# Patient Record
Sex: Male | Born: 1940
Health system: Southern US, Community
[De-identification: ages and names within clinical notes are randomized; demographics above are authoritative.]

## PROBLEM LIST (undated history)

## (undated) DIAGNOSIS — Z95810 Presence of automatic (implantable) cardiac defibrillator: Secondary | ICD-10-CM

## (undated) DIAGNOSIS — I428 Other cardiomyopathies: Secondary | ICD-10-CM

## (undated) DIAGNOSIS — I472 Ventricular tachycardia: Secondary | ICD-10-CM

## (undated) DIAGNOSIS — E785 Hyperlipidemia, unspecified: Secondary | ICD-10-CM

## (undated) DIAGNOSIS — I493 Ventricular premature depolarization: Secondary | ICD-10-CM

## (undated) DIAGNOSIS — I4719 Other supraventricular tachycardia: Secondary | ICD-10-CM

## (undated) DIAGNOSIS — Z950 Presence of cardiac pacemaker: Secondary | ICD-10-CM

## (undated) DIAGNOSIS — I4729 Other ventricular tachycardia: Secondary | ICD-10-CM

## (undated) DIAGNOSIS — J449 Chronic obstructive pulmonary disease, unspecified: Secondary | ICD-10-CM

## (undated) DIAGNOSIS — R911 Solitary pulmonary nodule: Secondary | ICD-10-CM

## (undated) DIAGNOSIS — I471 Supraventricular tachycardia: Secondary | ICD-10-CM

## (undated) DIAGNOSIS — M1990 Unspecified osteoarthritis, unspecified site: Secondary | ICD-10-CM

## (undated) DIAGNOSIS — I4891 Unspecified atrial fibrillation: Secondary | ICD-10-CM

## (undated) DIAGNOSIS — I252 Old myocardial infarction: Secondary | ICD-10-CM

## (undated) DIAGNOSIS — I5022 Chronic systolic (congestive) heart failure: Secondary | ICD-10-CM

## (undated) DIAGNOSIS — I10 Essential (primary) hypertension: Secondary | ICD-10-CM

## (undated) HISTORY — PX: PACEMAKER IMPLANT: EP1218

## (undated) HISTORY — DX: Solitary pulmonary nodule: R91.1

## (undated) HISTORY — PX: COLON SURGERY: SHX602

## (undated) HISTORY — DX: Hyperlipidemia, unspecified: E78.5

## (undated) HISTORY — DX: Other supraventricular tachycardia: I47.19

## (undated) HISTORY — DX: Old myocardial infarction: I25.2

## (undated) HISTORY — DX: Supraventricular tachycardia: I47.1

## (undated) HISTORY — DX: Chronic systolic (congestive) heart failure: I50.22

## (undated) HISTORY — DX: Chronic obstructive pulmonary disease, unspecified: J44.9

## (undated) HISTORY — DX: Essential (primary) hypertension: I10

## (undated) HISTORY — PX: APPENDECTOMY: SHX54

## (undated) HISTORY — PX: CARDIAC DEFIBRILLATOR PLACEMENT: SHX171

## (undated) HISTORY — DX: Ventricular tachycardia: I47.2

## (undated) HISTORY — DX: Other ventricular tachycardia: I47.29

## (undated) SURGERY — COLONOSCOPY
Anesthesia: Moderate Sedation

---

## 1998-11-23 HISTORY — PX: CARDIAC CATHETERIZATION: SHX172

## 1999-06-30 ENCOUNTER — Other Ambulatory Visit: Admission: RE | Admit: 1999-06-30 | Discharge: 1999-06-30 | Payer: Self-pay | Admitting: Urology

## 1999-10-10 ENCOUNTER — Encounter: Payer: Self-pay | Admitting: Emergency Medicine

## 1999-10-10 ENCOUNTER — Emergency Department (HOSPITAL_COMMUNITY): Admission: EM | Admit: 1999-10-10 | Discharge: 1999-10-10 | Payer: Self-pay | Admitting: Emergency Medicine

## 1999-10-30 ENCOUNTER — Encounter: Payer: Self-pay | Admitting: Cardiology

## 1999-10-30 ENCOUNTER — Ambulatory Visit (HOSPITAL_COMMUNITY): Admission: RE | Admit: 1999-10-30 | Discharge: 1999-10-30 | Payer: Self-pay | Admitting: Cardiology

## 2000-11-02 ENCOUNTER — Encounter: Payer: Self-pay | Admitting: Cardiology

## 2001-07-01 ENCOUNTER — Encounter (INDEPENDENT_AMBULATORY_CARE_PROVIDER_SITE_OTHER): Payer: Self-pay | Admitting: Specialist

## 2001-07-01 ENCOUNTER — Other Ambulatory Visit: Admission: RE | Admit: 2001-07-01 | Discharge: 2001-07-01 | Payer: Self-pay | Admitting: Urology

## 2001-07-20 ENCOUNTER — Encounter (INDEPENDENT_AMBULATORY_CARE_PROVIDER_SITE_OTHER): Payer: Self-pay | Admitting: *Deleted

## 2001-07-20 ENCOUNTER — Ambulatory Visit (HOSPITAL_COMMUNITY): Admission: RE | Admit: 2001-07-20 | Discharge: 2001-07-20 | Payer: Self-pay | Admitting: *Deleted

## 2001-09-22 ENCOUNTER — Encounter (INDEPENDENT_AMBULATORY_CARE_PROVIDER_SITE_OTHER): Payer: Self-pay

## 2001-09-22 ENCOUNTER — Ambulatory Visit (HOSPITAL_COMMUNITY): Admission: RE | Admit: 2001-09-22 | Discharge: 2001-09-22 | Payer: Self-pay | Admitting: *Deleted

## 2005-09-11 ENCOUNTER — Ambulatory Visit (HOSPITAL_COMMUNITY): Admission: RE | Admit: 2005-09-11 | Discharge: 2005-09-11 | Payer: Self-pay | Admitting: *Deleted

## 2005-09-11 ENCOUNTER — Encounter (INDEPENDENT_AMBULATORY_CARE_PROVIDER_SITE_OTHER): Payer: Self-pay | Admitting: Specialist

## 2007-03-11 ENCOUNTER — Encounter: Admission: RE | Admit: 2007-03-11 | Discharge: 2007-03-11 | Payer: Self-pay | Admitting: Internal Medicine

## 2007-03-29 ENCOUNTER — Ambulatory Visit: Payer: Self-pay | Admitting: Critical Care Medicine

## 2007-04-27 ENCOUNTER — Inpatient Hospital Stay (HOSPITAL_COMMUNITY): Admission: EM | Admit: 2007-04-27 | Discharge: 2007-04-29 | Payer: Self-pay | Admitting: Emergency Medicine

## 2007-04-28 ENCOUNTER — Encounter (INDEPENDENT_AMBULATORY_CARE_PROVIDER_SITE_OTHER): Payer: Self-pay | Admitting: *Deleted

## 2007-04-29 ENCOUNTER — Encounter (INDEPENDENT_AMBULATORY_CARE_PROVIDER_SITE_OTHER): Payer: Self-pay | Admitting: *Deleted

## 2007-06-06 ENCOUNTER — Encounter: Payer: Self-pay | Admitting: Cardiology

## 2007-12-26 ENCOUNTER — Ambulatory Visit: Payer: Self-pay | Admitting: Internal Medicine

## 2007-12-28 ENCOUNTER — Encounter: Payer: Self-pay | Admitting: Critical Care Medicine

## 2008-02-29 ENCOUNTER — Encounter: Admission: RE | Admit: 2008-02-29 | Discharge: 2008-02-29 | Payer: Self-pay | Admitting: Internal Medicine

## 2008-08-10 ENCOUNTER — Emergency Department (HOSPITAL_COMMUNITY): Admission: EM | Admit: 2008-08-10 | Discharge: 2008-08-10 | Payer: Self-pay

## 2008-12-04 ENCOUNTER — Telehealth: Payer: Self-pay | Admitting: Critical Care Medicine

## 2008-12-04 DIAGNOSIS — J984 Other disorders of lung: Secondary | ICD-10-CM

## 2008-12-07 ENCOUNTER — Ambulatory Visit: Payer: Self-pay | Admitting: Critical Care Medicine

## 2008-12-07 LAB — CONVERTED CEMR LAB
BUN: 18 mg/dL (ref 6–23)
CO2: 30 meq/L (ref 19–32)
Calcium: 9.2 mg/dL (ref 8.4–10.5)
Chloride: 106 meq/L (ref 96–112)
Creatinine, Ser: 1 mg/dL (ref 0.4–1.5)
GFR calc Af Amer: 96 mL/min
GFR calc non Af Amer: 79 mL/min
Glucose, Bld: 89 mg/dL (ref 70–99)
Potassium: 4.3 meq/L (ref 3.5–5.1)
Sodium: 142 meq/L (ref 135–145)

## 2008-12-10 ENCOUNTER — Encounter: Payer: Self-pay | Admitting: Critical Care Medicine

## 2008-12-12 ENCOUNTER — Encounter: Payer: Self-pay | Admitting: Critical Care Medicine

## 2008-12-12 ENCOUNTER — Ambulatory Visit: Payer: Self-pay | Admitting: Internal Medicine

## 2009-09-02 ENCOUNTER — Encounter: Payer: Self-pay | Admitting: Cardiology

## 2009-12-02 ENCOUNTER — Encounter: Admission: RE | Admit: 2009-12-02 | Discharge: 2009-12-02 | Payer: Self-pay | Admitting: Internal Medicine

## 2009-12-18 ENCOUNTER — Ambulatory Visit: Payer: Self-pay | Admitting: Critical Care Medicine

## 2009-12-18 DIAGNOSIS — J439 Emphysema, unspecified: Secondary | ICD-10-CM

## 2009-12-18 DIAGNOSIS — I10 Essential (primary) hypertension: Secondary | ICD-10-CM | POA: Insufficient documentation

## 2009-12-23 ENCOUNTER — Encounter: Payer: Self-pay | Admitting: Critical Care Medicine

## 2010-02-03 ENCOUNTER — Ambulatory Visit: Payer: Self-pay | Admitting: Critical Care Medicine

## 2010-02-10 ENCOUNTER — Encounter: Payer: Self-pay | Admitting: Critical Care Medicine

## 2010-05-13 ENCOUNTER — Ambulatory Visit: Payer: Self-pay | Admitting: Critical Care Medicine

## 2010-09-08 ENCOUNTER — Encounter (INDEPENDENT_AMBULATORY_CARE_PROVIDER_SITE_OTHER): Payer: Self-pay | Admitting: *Deleted

## 2010-09-15 ENCOUNTER — Encounter (INDEPENDENT_AMBULATORY_CARE_PROVIDER_SITE_OTHER): Payer: Self-pay | Admitting: *Deleted

## 2010-09-16 ENCOUNTER — Ambulatory Visit: Payer: Self-pay | Admitting: Gastroenterology

## 2010-09-30 ENCOUNTER — Ambulatory Visit: Payer: Self-pay | Admitting: Gastroenterology

## 2010-10-02 ENCOUNTER — Encounter: Payer: Self-pay | Admitting: Gastroenterology

## 2010-10-08 ENCOUNTER — Ambulatory Visit: Payer: Self-pay | Admitting: Critical Care Medicine

## 2010-10-08 DIAGNOSIS — J209 Acute bronchitis, unspecified: Secondary | ICD-10-CM

## 2010-10-09 ENCOUNTER — Telehealth (INDEPENDENT_AMBULATORY_CARE_PROVIDER_SITE_OTHER): Payer: Self-pay | Admitting: *Deleted

## 2010-12-10 ENCOUNTER — Ambulatory Visit
Admission: RE | Admit: 2010-12-10 | Discharge: 2010-12-10 | Payer: Self-pay | Source: Home / Self Care | Attending: Critical Care Medicine | Admitting: Critical Care Medicine

## 2010-12-23 NOTE — Miscellaneous (Signed)
Summary: [facility] Previsit/prep  Clinical Lists Changes  Medications: Added new medication of MOVIPREP 100 GM  SOLR (PEG-KCL-NACL-NASULF-NA ASC-C) As per prep instructions. - Signed Rx of MOVIPREP 100 GM  SOLR (PEG-KCL-NACL-NASULF-NA ASC-C) As per prep instructions.;  #1 x 0;  Signed;  Entered by: Wyona Almas RN;  Authorized by: Louis Meckel MD;  Method used: Electronically to [facility] # [phone]*, [address] [place], [place], [place]  [address], Ph: [phone], Fax: [phone] Allergies: Added new allergy or adverse reaction of LIPITOR Observations: Added new observation of NKA: F (09/16/2010 9:48)    Prescriptions: MOVIPREP 100 GM  SOLR (PEG-KCL-NACL-NASULF-NA ASC-C) As per prep instructions.  #1 x 0   Entered by:   Wyona Almas RN   Authorized by:   Louis Meckel MD   Signed by:   Wyona Almas RN on 09/16/2010   Method used:   Electronically to        [facility] # [phone]* (retail)       [address] [place], [place]  [address]       Ph: [phone]       Fax: [phone]   RxID:   [id number]

## 2010-12-23 NOTE — Letter (Signed)
Summary: Results Letter  [facility]  [address] [place], [place] [address]   Phone: [phone]  Fax: [phone]        October 02, 2010 MRN: [id number]    [facility] [address] [place], [place]  [address]    Dear Mr. Bryngelson,   Your biopsies demonstrated inflammatory changes only.    Please make an office appointment to discuss these findings.  Should you have any immediate concerns or questions, feel free to contact me at the office.    Sincerely,  Barbette Hair. Arlyce Dice, M.D., [facility]          Sincerely,  Louis Meckel MD  This letter has been electronically signed by your physician.  Appended Document: Results Letter letter mailed 11.15.2011

## 2010-12-23 NOTE — Assessment & Plan Note (Signed)
Summary: Pulmonary OV   Primary Provider/Referring Provider:  Dr. [name]  CC:  6 wk follow up with PFTs and Six Min Walk.  states breathing has improved a little but still having SOB with exertion and when lifting heavy objects.  states he has noticed BP has increased since starting advair.  states he has some wheezing qhs.  denies cough.  .  History of Present Illness: Pulmonary OV   70 yo WM last seen 2008 in this office for f/u of benign granuloma 4mm in diameter RLL.  THis had not changed over 2 years and no further CTs recommended. Pt now here for new problem of dyspnea occuring over past 6 months.   No real cough.  Notes some wheezes.  Pt notes some ant chest pain.  There is sharp like pain. The chest is sore to touch.  There is no change with deep breath  Pt rx advair per pcp  two months ago.  Now  only uses as needed advair and not on any schedule.  February 03, 2010 10:50 AM At last ov we started Advair 250 one puff twice daily Today the pt did a 6 min walk and went and no desat pt states advair helps  but bp is higher.  197  at times no real cough.  dyspnea is better.  pt tol advair well but inhales too fast on discus  Preventive Screening-Counseling & Management  Alcohol-Tobacco     Smoking Status: quit > 6 months  Current Medications (verified): 1)  Metoprolol Tartrate 25 Mg Tabs (Metoprolol Tartrate) .... 1/2 Tab Two Times A Day 2)  Omeprazole 20 Mg Cpdr (Omeprazole) .... Two Times A Day 3)  Crestor 5 Mg Tabs (Rosuvastatin Calcium) .... Once Daily 4)  Lisinopril 40 Mg Tabs (Lisinopril) .... Once Daily 5)  Citalopram Hydrobromide 40 Mg Tabs (Citalopram Hydrobromide) .... Once Daily 6)  Bayer Childrens Aspirin 81 Mg Chew (Aspirin) .... Once Daily 7)  Flomax 0.4 Mg Caps (Tamsulosin Hcl) .... Once Daily 8)  Multivitamins  Tabs (Multiple Vitamin) .... Once Daily 9)  Metamucil 30.9 % Powd (Psyllium) .... Once Daily 10)  Fish Oil 1200 Mg Caps (Omega-3 Fatty Acids)  .... Two Times A Day 11)  Advair Diskus 250-50 Mcg/dose  Misc (Fluticasone-Salmeterol) .... One Puff Twice Daily  Allergies (verified): No Known Drug Allergies  Past History:  Past medical, surgical, family and social histories (including risk factors) reviewed, and no changes noted (except as noted below).  Past Medical History: Reviewed history from 12/18/2009 and no changes required. Current Problems:  MYOCARDIAL INFARCTION, HX OF (ICD-412) HYPERTENSION (ICD-401.9) PULMONARY NODULE (ICD-518.89)  Past Surgical History: Reviewed history from 12/18/2009 and no changes required. colon surgery in 1989  Family History: Reviewed history from 12/18/2009 and no changes required. emphysema-mother asthma-son heart disease-mother  Social History: Reviewed history from 12/18/2009 and no changes required. Patient states former smoker.  Quit in 1985.  2 1/2 ppd x 30yrs married 2 children Retired Radio producer,  now works full time at [employer]  Smoking Status:  quit > 6 months  Review of Systems       The patient complains of shortness of breath with activity.  The patient denies shortness of breath at rest, productive cough, non-productive cough, coughing up blood, chest pain, irregular heartbeats, acid heartburn, indigestion, loss of appetite, weight change, abdominal pain, difficulty swallowing, sore throat, tooth/dental problems, headaches, nasal congestion/difficulty breathing through nose, sneezing, itching, ear ache, anxiety, depression, hand/feet swelling, joint  stiffness or pain, rash, change in color of mucus, and fever.    Vital Signs:  Patient profile:   70 year old male Height:      73 inches Weight:      203 pounds BMI:     26.88 O2 Sat:      93 % on Room air Temp:     98.0 degrees F oral Pulse rate:   74 / minute BP sitting:   136 / 78  (left arm) Cuff size:   regular  Vitals Entered By: Gweneth Dimitri RN (February 03, 2010 10:33 AM)  O2 Flow:  Room  air CC: 6 wk follow up with PFTs and Six Min Walk.  states breathing has improved a little but still having SOB with exertion and when lifting heavy objects.  states he has noticed BP has increased since starting advair.  states he has some wheezing qhs.  denies cough.   Comments Medications reviewed with patient Daytime contact number verified with patient. Gweneth Dimitri RN  February 03, 2010 10:35 AM    Physical Exam  Additional Exam:  Gen: Pleasant, well nourished, in no distress ENT: no lesions, no postnasal drip Neck: No JVD, no TMG, no carotid bruits Lungs: no use of accessory muscles, no dullness to percussion, distant BS Cardiovascular: RRR, heart sounds normal, no murmurs or gallops, no peripheral edema Musculoskeletal: No deformities, no cyanosis or clubbing     Impression & Recommendations:  Problem # 1:  HYPERTENSION (ICD-401.9) Assessment Deteriorated htn exacerbated by advair inhaler technique plan pt to inhale more slowly,  monitor bp,  if remains high will have to d/c the advair and migrate to spiriva  His updated medication list for this problem includes:    Metoprolol Tartrate 25 Mg Tabs (Metoprolol tartrate) .Marland Kitchen... 1/2 tab two times a day    Lisinopril 40 Mg Tabs (Lisinopril) ..... Once daily  Problem # 2:  COPD (ICD-496) Assessment: Improved  see assessment number one  Complete Medication List: 1)  Metoprolol Tartrate 25 Mg Tabs (Metoprolol tartrate) .... 1/2 tab two times a day 2)  Omeprazole 20 Mg Cpdr (Omeprazole) .... Two times a day 3)  Crestor 5 Mg Tabs (Rosuvastatin calcium) .... Once daily 4)  Lisinopril 40 Mg Tabs (Lisinopril) .... Once daily 5)  Citalopram Hydrobromide 40 Mg Tabs (Citalopram hydrobromide) .... Once daily 6)  Bayer Childrens Aspirin 81 Mg Chew (Aspirin) .... Once daily 7)  Flomax 0.4 Mg Caps (Tamsulosin hcl) .... Once daily 8)  Multivitamins Tabs (Multiple vitamin) .... Once daily 9)  Metamucil 30.9 % Powd (Psyllium) .... Once  daily 10)  Fish Oil 1200 Mg Caps (Omega-3 fatty acids) .... Two times a day 11)  Advair Diskus 250-50 Mcg/dose Misc (Fluticasone-salmeterol) .... One puff twice daily  Other Orders: Est. Patient Level III (66440)  Patient Instructions: 1)  Stay on advair,  remember slow and deep inhalation 2)  Return 3 months 3)  Call me with blood pressure report over the next two weeks    Appended Document: Pulmonary OV fax james kim

## 2010-12-23 NOTE — Assessment & Plan Note (Signed)
Summary: Pulmonary OV   Visit Type:  Follow-up Primary Provider/Referring Provider:  Dr. [name]  CC:  COPD. The patient says his breathing has improved but is only using his Advair twice daily every 2 or 3 days due to increased blood pressure. He also thinks the Advair is affecting his vision making it blurry..  History of Present Illness: Pulmonary OV   70 yo WM last seen 2008 in this office for f/u of benign granuloma 4mm in diameter RLL.  THis had not changed over 2 years and no further CTs recommended. Pt now here for new problem of dyspnea occuring over past 6 months.   No real cough.  Notes some wheezes.  Pt notes some ant chest pain.  There is sharp like pain. The chest is sore to touch.  There is no change with deep breath  Pt rx advair per pcp  two months ago.  Now  only uses as needed advair and not on any schedule.  February 03, 2010 10:50 AM At last ov we started Advair 250 one puff twice daily Today the pt did a 6 min walk and went and no desat pt states advair helps  but bp is higher.  197  at times no real cough.  dyspnea is better.  pt tol advair well but inhales too fast on discus  May 13, 2010 11:36 AM The pt notes that since 3/11l  if exerts self is dyspneic,  but does ok.  The advair has helped.  The pt notes the  bp still high with daily advair use so only using advair every 3 days. not using advair daily due to the bp issue. Pt denies any significant sore throat, nasal congestion or excess secretions, fever, chills, sweats, unintended weight loss, pleurtic or exertional chest pain, orthopnea PND, or leg swelling Pt denies any increase in rescue therapy over baseline, denies waking up needing it or having any early am or nocturnal exacerbations of coughing/wheezing/or dyspnea.   Preventive Screening-Counseling & Management  Alcohol-Tobacco     Smoking Status: quit > 6 months  Current Medications (verified): 1)  Metoprolol Tartrate 25 Mg Tabs  (Metoprolol Tartrate) .... 1/2 Tab Two Times A Day 2)  Omeprazole 20 Mg Cpdr (Omeprazole) .... Two Times A Day 3)  Crestor 5 Mg Tabs (Rosuvastatin Calcium) .... Once Daily 4)  Lisinopril 40 Mg Tabs (Lisinopril) .... Once Daily 5)  Citalopram Hydrobromide 40 Mg Tabs (Citalopram Hydrobromide) .... Once Daily 6)  Bayer Childrens Aspirin 81 Mg Chew (Aspirin) .... Once Daily 7)  Flomax 0.4 Mg Caps (Tamsulosin Hcl) .... Once Daily 8)  Multivitamins  Tabs (Multiple Vitamin) .... Once Daily 9)  Metamucil 30.9 % Powd (Psyllium) .... Once Daily 10)  Fish Oil 1200 Mg Caps (Omega-3 Fatty Acids) .... Two Times A Day 11)  Advair Diskus 250-50 Mcg/dose  Misc (Fluticasone-Salmeterol) .... One Puff Twice Daily  Allergies (verified): No Known Drug Allergies  Past History:  Past medical, surgical, family and social histories (including risk factors) reviewed, and no changes noted (except as noted below).  Past Medical History: Reviewed history from 12/18/2009 and no changes required. Current Problems:  MYOCARDIAL INFARCTION, HX OF (ICD-412) HYPERTENSION (ICD-401.9) PULMONARY NODULE (ICD-518.89)  Past Surgical History: Reviewed history from 12/18/2009 and no changes required. colon surgery in 1989  Clinical Reports Reviewed:  PFT's:  02/10/2010: DLCO %Predicted:  56 FEF 25/75 %Predicted:  14 FEV1 %Predicted:  68 FVC %Predicted:  96 Post Spirometry FEF 25/75 %Predicted:  24 Post  Spirometry FEV1 %Predicted:  75 Post Spirometry FVC %Predicted:  97 RV %Predicted:  119 TLC %Predicted:  109  CT of Chest:  12/12/2008: CT of Chest:  abnormal:    Findings: No pathologically enlarged mediastinal, hilar or axillary lymph nodes.  Coronary artery calcification.  Heart size normal. No pericardial effusion. A sebaceous cyst seen in the midline of the upper back on the prior study, has nearly completely resolved in the interval.   Lungs are emphysematous with centrilobular and paraseptal  changes. A nodule in the right lower lobe (image 48) is less prominent on today's study.  Subpleural scarring is seen in the lingula and left lower lobe.  No pleural fluid.  Airway unremarkable.   Incidental imaging of the upper abdomen shows an 8 mm low density lesion in the right kidney, which is likely a cyst.  No worrisome lytic or sclerotic lesions.   IMPRESSION: Small right lower lobe nodule is stable from 03/11/2007, and is therefore considered benign.    12/26/2007: CT of Chest:  abnormal:  5 mm nodule RLL with centrilobular emphysema.  no change compared to 4/08 study   will recheck in one year  03/11/2007: CT of Chest:  4mm nodule non calcified RLL  No other abnormalities seen   Family History: Reviewed history from 12/18/2009 and no changes required. emphysema-mother asthma-son heart disease-mother  Social History: Reviewed history from 12/18/2009 and no changes required. Patient states former smoker.  Quit in 1985.  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  The patient denies shortness of breath at rest, productive cough, non-productive cough, coughing up blood, chest pain, irregular heartbeats, acid heartburn, indigestion, loss of appetite, weight change, abdominal pain, difficulty swallowing, sore throat, tooth/dental problems, headaches, nasal congestion/difficulty breathing through nose, sneezing, itching, ear ache, anxiety, depression, hand/feet swelling, joint stiffness or pain, rash, change in color of mucus, and fever.    Vital Signs:  Patient profile:   70 year old male Height:      73 inches (185.42 cm) Weight:      200 pounds (90.91 kg) BMI:     26.48 O2 Sat:      94 % on Room air Temp:     98.0 degrees F (36.67 degrees C) oral Pulse rate:   59 / minute BP sitting:   132 / 70  (left arm) Cuff size:    regular  Vitals Entered By: Michel Bickers CMA (May 13, 2010 11:30 AM)  O2 Sat at Rest %:  94 O2 Flow:  Room air CC: COPD. The patient says his breathing has improved but is only using his Advair twice daily every 2 or 3 days due to increased blood pressure. He also thinks the Advair is affecting his vision making it blurry. Comments Medications reviewed. Daytime phone verified. Michel Bickers CMA  May 13, 2010 11:31 AM   Physical Exam  Additional Exam:  Gen: Pleasant, well nourished, in no distress ENT: no lesions, no postnasal drip Neck: No JVD, no TMG, no carotid bruits Lungs: no use of accessory muscles, no dullness to percussion, distant BS Cardiovascular: RRR, heart sounds normal, no murmurs or gallops, no peripheral edema Musculoskeletal: No deformities, no cyanosis or clubbing     Impression & Recommendations:  Problem # 1:  COPD (ICD-496) Assessment Unchanged COPD stable at this time  plan No change in inhaled medications.   Maintain treatment program as currently prescribed. Try advair daily one puff   Medications Added to Medication List This Visit: 1)  Advair Diskus 250-50 Mcg/dose Misc (Fluticasone-salmeterol) .... One puff once  daily  Complete Medication List: 1)  Metoprolol Tartrate 25 Mg Tabs (Metoprolol tartrate) .... 1/2 tab two times a day 2)  Omeprazole 20 Mg Cpdr (Omeprazole) .... Two times a day 3)  Crestor 5 Mg Tabs (Rosuvastatin calcium) .... Once daily 4)  Lisinopril 40 Mg Tabs (Lisinopril) .... Once daily 5)  Citalopram Hydrobromide 40 Mg Tabs (Citalopram hydrobromide) .... Once daily 6)  Bayer Childrens Aspirin 81 Mg Chew (Aspirin) .... Once daily 7)  Flomax 0.4 Mg Caps (Tamsulosin hcl) .... Once daily 8)  Multivitamins Tabs (Multiple vitamin) .... Once daily 9)  Metamucil 30.9 % Powd (Psyllium) .... Once daily 10)  Fish Oil 1200 Mg Caps (Omega-3 fatty acids) .... Two times a day 11)  Advair Diskus 250-50 Mcg/dose Misc (Fluticasone-salmeterol)  .... One puff once  daily  Other Orders: Est. Patient Level III (01093)  Patient Instructions: 1)  No change in medications except try Advair one puff daily 2)  Call me in two weeks with a report on how your blood pressure is doing on the once daily Advair 3)  Return in    6      months

## 2010-12-23 NOTE — Assessment & Plan Note (Signed)
Summary: SIX MIN WALK- PULM STRESS TEST  Nurse Visit   Vital Signs:  Patient profile:   70 year old male Pulse rate:   65 / minute BP sitting:   130 / 76  Medications Prior to Update: 1)  Metoprolol Tartrate 25 Mg Tabs (Metoprolol Tartrate) .... 1/2 Tab Two Times A Day 2)  Omeprazole 20 Mg Cpdr (Omeprazole) .... Two Times A Day 3)  Crestor 5 Mg Tabs (Rosuvastatin Calcium) .... Once Daily 4)  Lisinopril 40 Mg Tabs (Lisinopril) .... Once Daily 5)  Citalopram Hydrobromide 40 Mg Tabs (Citalopram Hydrobromide) .... Once Daily 6)  Bayer Childrens Aspirin 81 Mg Chew (Aspirin) .... Once Daily 7)  Flomax 0.4 Mg Caps (Tamsulosin Hcl) .... Once Daily 8)  Multivitamins  Tabs (Multiple Vitamin) .... Once Daily 9)  Metamucil 30.9 % Powd (Psyllium) .... Once Daily 10)  Fish Oil 1200 Mg Caps (Omega-3 Fatty Acids) .... Two Times A Day 11)  Advair Diskus 250-50 Mcg/dose  Misc (Fluticasone-Salmeterol) .... One Puff Twice Daily  Allergies: No Known Drug Allergies  Orders Added: 1)  Pulmonary Stress (6 min walk) [94620]   Six Minute Walk Test Medications taken before test(dose and time): 1)  Metoprolol Tartrate 25 Mg Tabs (Metoprolol Tartrate) .... 1/2 Tab Two Times A Day 2)  Omeprazole 20 Mg Cpdr (Omeprazole) .... Two Times A Day 3)  Crestor 5 Mg Tabs (Rosuvastatin Calcium) .... Once Daily 4)  Lisinopril 40 Mg Tabs (Lisinopril) .... Once Daily 5)  Citalopram Hydrobromide 40 Mg Tabs (Citalopram Hydrobromide) .... Once Daily 6)  Bayer Childrens Aspirin 81 Mg Chew (Aspirin) .... Once Daily 7)  Flomax 0.4 Mg Caps (Tamsulosin Hcl) .... Once Daily 8)  Multivitamins  Tabs (Multiple Vitamin) .... Once Daily 9)  Metamucil 30.9 % Powd (Psyllium) .... Once Daily 10)  Fish Oil 1200 Mg Caps (Omega-3 Fatty Acids) .... Two Times A Day Pt took these meds at 800 am Supplemental oxygen during the test: No  Lap counter(place a tick mark inside a square for each lap completed) lap 1 complete  lap 2 complete     lap 3 complete   lap 4 complete  lap 5 complete  lap 6 complete  lap 7 complete   lap 8 complete    Baseline  BP sitting: 130/ 76 Heart rate: 65 Dyspnea ( Borg scale) 3 Fatigue (Borg scale) 0 SPO2 93  End Of Test  BP sitting: 128/ 80 Heart rate: 67 Dyspnea ( Borg scale) 4 Fatigue (Borg scale) 1 SPO2 95  2 Minutes post  BP sitting: 124/ 72 Heart rate: 71 SPO2 95  Stopped or paused before six minutes? No  Interpretation: Number of laps  8 X 48 meters =   384 meters+ final partial lap: 36 meters =    420 meters   Total distance walked in six minutes: 420 meters  Tech ID: Tivis Ringer, CNA (February 03, 2010 9:54 AM) Jeremy Johann Comments Pt completed test w/ 0 rest breaks and 0 complaints.

## 2010-12-23 NOTE — Medication Information (Signed)
Summary: Prescriber Response Form for Advair/[facility]  Prescriber Response Form for Advair/[facility]   Imported By: Sherian Rein 12/26/2009 13:19:22  _____________________________________________________________________  External Attachment:    Type:   Image     Comment:   External Document

## 2010-12-23 NOTE — Miscellaneous (Signed)
Summary: PFTs   Pulmonary Function Test Date: 02/03/2010 Height (in.): 73 Gender: Male  Pre-Spirometry FVC    Value: 4.66 L/min   Pred: 4.84 L/min     % Pred: 96 % FEV1    Value: 2.19 L     Pred: 3.25 L     % Pred: 68 % FEV1/FVC  Value: 47 %     Pred: 68 %    FEF 25-75  Value: 0.41 L/min   Pred: 2.84 L/min     % Pred: 14 %  Post-Spirometry FVC    Value: 4.69 L/min   Pred: 4.84 L/min     % Pred: 97 % FEV1    Value: 2.44 L     Pred: 3.25 L     % Pred: 75 % FEV1/FVC  Value: 52 %     Pred: 68 %    FEF 25-75  Value: 0.67 L/min   Pred: 2.84 L/min     % Pred: 24 %  Lung Volumes TLC    Value: 7.91 L   % Pred: 109 % RV    Value: 3.25 L   % Pred: 119 % DLCO    Value: 13.5 %   % Pred: 56 % DLCO/VA  Value: 2.30 %   % Pred: 65 %  Comments: Moderate reduction in DLCO,  moderate airtrapping   Evaluation: moderate obstruction with significant bronchodilator response Clinical Lists Changes  Observations: Added new observation of PFT COMMENTS: Moderate reduction in DLCO,  moderate airtrapping  (02/10/2010 15:33) Added new observation of PFT RSLT: moderate obstruction with significant bronchodilator response (02/10/2010 15:33) Added new observation of DLCO/VA%EXP: 65 % (02/10/2010 15:33) Added new observation of DLCO/VA: 2.30 % (02/10/2010 15:33) Added new observation of DLCO % EXPEC: 56 % (02/10/2010 15:33) Added new observation of DLCO: 13.5 % (02/10/2010 15:33) Added new observation of RV % EXPECT: 119 % (02/10/2010 15:33) Added new observation of RV: 3.25 L (02/10/2010 15:33) Added new observation of TLC % EXPECT: 109 % (02/10/2010 15:33) Added new observation of TLC: 7.91 L (02/10/2010 15:33) Added new observation of FEF2575%EXPS: 24 % (02/10/2010 15:33) Added new observation of PSTFEF25/75P: 2.84  (02/10/2010 15:33) Added new observation of PSTFEF25/75%: 0.67 L/min (02/10/2010 15:33) Added new observation of FEV1FVCPRDPS: 68 % (02/10/2010 15:33) Added new observation of PSTFEV1/FVC:  52 % (02/10/2010 15:33) Added new observation of POSTFEV1%PRD: 75 % (02/10/2010 15:33) Added new observation of FEV1PRDPST: 3.25 L (02/10/2010 15:33) Added new observation of POST FEV1: 2.44 L/min (02/10/2010 15:33) Added new observation of POST FVC%EXP: 97 % (02/10/2010 15:33) Added new observation of FVCPRDPST: 4.84 L/min (02/10/2010 15:33) Added new observation of POST FVC: 4.69 L (02/10/2010 15:33) Added new observation of FEF % EXPEC: 14 % (02/10/2010 15:33) Added new observation of FEF25-75%PRE: 2.84 L/min (02/10/2010 15:33) Added new observation of FEF 25-75%: 0.41 L/min (02/10/2010 15:33) Added new observation of FEV1/FVC PRE: 68 % (02/10/2010 15:33) Added new observation of FEV1/FVC: 47 % (02/10/2010 15:33) Added new observation of FEV1 % EXP: 68 % (02/10/2010 15:33) Added new observation of FEV1 PREDICT: 3.25 L (02/10/2010 15:33) Added new observation of FEV1: 2.19 L (02/10/2010 15:33) Added new observation of FVC % EXPECT: 96 % (02/10/2010 15:33) Added new observation of FVC PREDICT: 4.84 L (02/10/2010 15:33) Added new observation of FVC: 4.66 L (02/10/2010 15:33) Added new observation of PFT HEIGHT: 73  (02/10/2010 15:33) Added new observation of PFT DATE: 02/03/2010  (02/10/2010 15:33)  Appended Document: PFTs result noted  patient aware

## 2010-12-23 NOTE — Letter (Signed)
Summary: Corey Tyler Instructions  Corey Tyler  [address] [place], [place] [address]   Phone: [phone]  Fax: [phone]       CREIG LANDIN    71-Nov-1942    MRN: [id number]        Procedure Day Corey Tyler:  Jake Shark  09/30/10     Arrival Time:  9:00AM     Procedure Time:  10:00AM     Location of Procedure:                    _X _  [facility] (4th Floor)                       PREPARATION FOR COLONOSCOPY WITH MOVIPREP   Starting 5 days prior to your procedure 09/25/10 do not eat nuts, seeds, popcorn, corn, beans, peas,  salads, or any raw vegetables.  Do not take any fiber supplements (e.g. Metamucil, Citrucel, and Benefiber).  THE DAY BEFORE YOUR PROCEDURE         DATE: 09/29/10   DAY: MONDAY  1.  Drink clear liquids the entire day-NO SOLID FOOD  2.  Do not drink anything colored red or purple.  Avoid juices with pulp.  No orange juice.  3.  Drink at least 64 oz. (8 glasses) of fluid/clear liquids during the day to prevent dehydration and help the prep work efficiently.  CLEAR LIQUIDS INCLUDE: Water Jello Ice Popsicles Tea (sugar ok, no milk/cream) Powdered fruit flavored drinks Coffee (sugar ok, no milk/cream) Gatorade Juice: apple, white grape, white cranberry  Lemonade Clear bullion, consomm, broth Carbonated beverages (any kind) Strained chicken noodle soup Hard Candy                             4.  In the morning, mix first dose of MoviPrep solution:    Empty 1 Pouch A and 1 Pouch B into the disposable container    Add lukewarm drinking water to the top line of the container. Mix to dissolve    Refrigerate (mixed solution should be used within 24 hrs)  5.  Begin drinking the prep at 5:00 p.m. The MoviPrep container is divided by 4 marks.   Every 15 minutes drink the solution down to the next mark (approximately 8 oz) until the full liter is complete.   6.  Follow completed prep with 16 oz of clear liquid of your choice (Nothing  red or purple).  Continue to drink clear liquids until bedtime.  7.  Before going to bed, mix second dose of MoviPrep solution:    Empty 1 Pouch A and 1 Pouch B into the disposable container    Add lukewarm drinking water to the top line of the container. Mix to dissolve    Refrigerate  THE DAY OF YOUR PROCEDURE      DATE: 09/30/10  DAY: TUESDAY  Beginning at 5:00AM (5 hours before procedure):         1. Every 15 minutes, drink the solution down to the next mark (approx 8 oz) until the full liter is complete.  2. Follow completed prep with 16 oz. of clear liquid of your choice.    3. You may drink clear liquids until 8:00AM (2 HOURS BEFORE PROCEDURE).   MEDICATION INSTRUCTIONS  Unless otherwise instructed, you should take regular prescription medications with a small sip of water   as early as possible the morning  of your procedure.       OTHER INSTRUCTIONS  You will need a responsible adult at least 71 years of age to accompany you and drive you home.   This person must remain in the waiting room during your procedure.  Wear loose fitting clothing that is easily removed.  Leave jewelry and other valuables at home.  However, you may wish to bring a book to read or  an iPod/MP3 player to listen to music as you wait for your procedure to start.  Remove all body piercing jewelry and leave at home.  Total time from sign-in until discharge is approximately 2-3 hours.  You should go home directly after your procedure and rest.  You can resume normal activities the  day after your procedure.  The day of your procedure you should not:   Drive   Make legal decisions   Operate machinery   Drink alcohol   Return to work  You will receive specific instructions about eating, activities and medications before you leave.    The above instructions have been reviewed and explained to me by   Wyona Almas RN  September 16, 2010 10:25 AM     I fully understand and can  verbalize these instructions _____________________________ Date _________

## 2010-12-23 NOTE — Assessment & Plan Note (Signed)
Summary: Pulmonary OV   Primary Provider/Referring Provider:  Dr. [name]  CC:  Acute Visit.  increased SOB - onset this am.  States he took aspirin and afterwards SOB was some better.  Occas cough - prod at times with white mucus..  History of Present Illness: Pulmonary OV  70 yo WM last seen 2008 in this office for f/u of benign granuloma 4mm in diameter RLL.  THis had not changed over 2 years and no further CTs recommended. Pt now here for new problem of dyspnea occuring over past 6 months.   No real cough.  Notes some wheezes.  Pt notes some ant chest pain.  There is sharp like pain. The chest is sore to touch.  There is no change with deep breath  Pt rx advair per pcp  two months ago.  Now  only uses as needed advair and not on any schedule.  February 03, 2010 10:50 AM At last ov we started Advair 250 one puff twice daily Today the pt did a 6 min walk and went and no desat pt states advair helps  but bp is higher.  197  at times no real cough.  dyspnea is better.  pt tol advair well but inhales too fast on discus  May 13, 2010 11:36 AM The pt notes that since 3/11l  if exerts self is dyspneic,  but does ok.  The advair has helped.  The pt notes the  bp still high with daily advair use so only using advair every 3 days. not using advair daily due to the bp issue. Pt denies any significant sore throat, nasal congestion or excess secretions, fever, chills, sweats, unintended weight loss, pleurtic or exertional chest pain, orthopnea PND, or leg swelling Pt denies any increase in rescue therapy over baseline, denies waking up needing it or having any early am or nocturnal exacerbations of coughing/wheezing/or dyspnea. October 08, 2010 4:47 PM Pt notes more dyspnea today as got up for work.  The pt notes the  cough is worse and more productive.  The  mucus is darker.  There is no fever.  The pt notes some wheezing at night . Overall much worse over the past few days.   Current  Medications (verified): 1)  Metoprolol Tartrate 25 Mg Tabs (Metoprolol Tartrate) .... 1/2 Tab Two Times A Day 2)  Omeprazole 20 Mg Cpdr (Omeprazole) .... Two Times A Day 3)  Crestor 5 Mg Tabs (Rosuvastatin Calcium) .... Once Daily 4)  Lisinopril 40 Mg Tabs (Lisinopril) .... Once Daily 5)  Citalopram Hydrobromide 20 Mg Tabs (Citalopram Hydrobromide) .... Take 1 Tablet By Mouth Once A Day 6)  Bayer Childrens Aspirin 81 Mg Chew (Aspirin) .... Once Daily 7)  Flomax 0.4 Mg Caps (Tamsulosin Hcl) .... Once Daily 8)  Multivitamins  Tabs (Multiple Vitamin) .... Once Daily 9)  Metamucil 30.9 % Powd (Psyllium) .... Once Daily 10)  Fish Oil 1200 Mg Caps (Omega-3 Fatty Acids) .... Two Times A Day 11)  Advair Diskus 250-50 Mcg/dose  Misc (Fluticasone-Salmeterol) .... One Puff Once  Daily 12)  Claritin 10 Mg Tabs (Loratadine) .... Take 1 Tablet By Mouth Once A Day  Allergies (verified): 1)  ! Lipitor  Past History:  Past medical, surgical, family and social histories (including risk factors) reviewed, and no changes noted (except as noted below).  Past Medical History: Reviewed history from 12/18/2009 and no changes required. Current Problems:  MYOCARDIAL INFARCTION, HX OF (ICD-412) HYPERTENSION (ICD-401.9) PULMONARY NODULE (ICD-518.89)  Past Surgical History: Reviewed history from 12/18/2009 and no changes required. colon surgery in 1989  Family History: Reviewed history from 12/18/2009 and no changes required. emphysema-mother asthma-son heart disease-mother  Social History: Reviewed history from 12/18/2009 and no changes required. Patient states former smoker.  Quit in 1985.  2 1/2 ppd x 90yrs married 2 children Retired Radio producer,  now works full time at [employer]  Review of Systems       The patient complains of shortness of breath with activity, shortness of breath at rest, productive cough, non-productive cough, and change in color of mucus.  The patient denies  coughing up blood, chest pain, irregular heartbeats, acid heartburn, indigestion, loss of appetite, weight change, abdominal pain, difficulty swallowing, sore throat, tooth/dental problems, headaches, nasal congestion/difficulty breathing through nose, sneezing, itching, ear ache, anxiety, depression, hand/feet swelling, joint stiffness or pain, rash, and fever.    Vital Signs:  Patient profile:   70 year old male Height:      73 inches Weight:      202 pounds BMI:     26.75 O2 Sat:      89 % on Room air Temp:     98.1 degrees F oral Pulse rate:   54 / minute BP sitting:   132 / 86  (left arm) Cuff size:   regular  Vitals Entered By: Gweneth Dimitri RN (October 08, 2010 4:29 PM)  O2 Flow:  Room air  O2 Sat Comments Pt arrived to exam room with o2 sat 89% RA.  After resting for a minute, o2 sat increased to 96% RA  Gweneth Dimitri RN  October 08, 2010 4:32 PM  CC: Acute Visit.  increased SOB - onset this am.  States he took aspirin and afterwards SOB was some better.  Occas cough - prod at times with white mucus. Comments Medications reviewed with patient Daytime contact number verified with patient. Gweneth Dimitri RN  October 08, 2010 4:26 PM    Physical Exam  Additional Exam:  Gen: Pleasant, well nourished, in no distress ENT: no lesions, no postnasal drip Neck: No JVD, no TMG, no carotid bruits Lungs: no use of accessory muscles, no dullness to percussion, exp wheezes. poor airflow Cardiovascular: RRR, heart sounds normal, no murmurs or gallops, no peripheral edema Musculoskeletal: No deformities, no cyanosis or clubbing     CXR  Procedure date:  10/08/2010  Findings:      IMPRESSION: Peribronchial thickening with mild bibasilar atelectasis.    Impression & Recommendations:  Problem # 1:  ACUTE BRONCHITIS (ICD-466.0) Assessment Deteriorated acute tracheobronchitis with flare plan augmentin 875mg   two times a day x 7days pulse prednisone note CXR today shows no  active disease His updated medication list for this problem includes:    Advair Diskus 250-50 Mcg/dose Misc (Fluticasone-salmeterol) ..... One puff twice daily  Orders: Est. Patient Level IV (47829) T-2 View CXR (71020TC)  Medications Added to Medication List This Visit: 1)  Citalopram Hydrobromide 20 Mg Tabs (Citalopram hydrobromide) .... Take 1 tablet by mouth once a day 2)  Advair Diskus 250-50 Mcg/dose Misc (Fluticasone-salmeterol) .... One puff twice daily 3)  Claritin 10 Mg Tabs (Loratadine) .... Take 1 tablet by mouth once a day  Complete Medication List: 1)  Metoprolol Tartrate 25 Mg Tabs (Metoprolol tartrate) .... 1/2 tab two times a day 2)  Omeprazole 20 Mg Cpdr (Omeprazole) .... Two times a day 3)  Crestor 5 Mg Tabs (Rosuvastatin calcium) .... Once daily 4)  Lisinopril 40  Mg Tabs (Lisinopril) .... Once daily 5)  Citalopram Hydrobromide 20 Mg Tabs (Citalopram hydrobromide) .... Take 1 tablet by mouth once a day 6)  Bayer Childrens Aspirin 81 Mg Chew (Aspirin) .... Once daily 7)  Flomax 0.4 Mg Caps (Tamsulosin hcl) .... Once daily 8)  Multivitamins Tabs (Multiple vitamin) .... Once daily 9)  Metamucil 30.9 % Powd (Psyllium) .... Once daily 10)  Fish Oil 1200 Mg Caps (Omega-3 fatty acids) .... Two times a day 11)  Advair Diskus 250-50 Mcg/dose Misc (Fluticasone-salmeterol) .... One puff twice daily 12)  Claritin 10 Mg Tabs (Loratadine) .... Take 1 tablet by mouth once a day  Patient Instructions: 1)  Prednisone 10mg  4 each am x3days, 3 x 3days, 2 x 3days, 1 x 3days then stop 2)  Augmentin one twice daily for 7days 3)  A chest xray will be obtained 4)  Return 2 months   Immunization History:  Influenza Immunization History:    Influenza:  historical (08/23/2010)   Appended Document: Pulmonary OV fax james kim

## 2010-12-23 NOTE — Miscellaneous (Signed)
Summary: Orders Update pft charges  Clinical Lists Changes  Orders: Added new Service order of Carbon Monoxide diffusing w/capacity (94720) - Signed Added new Service order of Lung Volumes (94240) - Signed Added new Service order of Spirometry (Pre & Post) (94060) - Signed 

## 2010-12-23 NOTE — Progress Notes (Signed)
Summary: waiting on rx's  Phone Note Call from Patient   Caller: Patient Call For: WRIGHT Summary of Call: rx's for prednisone and augmentin [facility] [address] (pt was seen yesterday). call pt at [phone] to confirm that this has been done.  Initial call taken by: Tivis Ringer, CNA,  October 09, 2010 3:11 PM  Follow-up for Phone Call        pt aware meds sent to pharmacy and i also called and gave them verbally so pt could pick these up today Follow-up by: Philipp Deputy CMA,  October 09, 2010 4:02 PM    New/Updated Medications: AUGMENTIN 875-125 MG TABS (AMOXICILLIN-POT CLAVULANATE) 1 by mouth twice a day PREDNISONE 10 MG TABS (PREDNISONE) 4 tabs each morning x 3 days, 3 tabs each morning x 3 days,2 tabs each morning x 3 days,1 tab each morning x 3 days then stop Prescriptions: PREDNISONE 10 MG TABS (PREDNISONE) 4 tabs each morning x 3 days, 3 tabs each morning x 3 days,2 tabs each morning x 3 days,1 tab each morning x 3 days then stop  #30 x 0   Entered by:   Philipp Deputy CMA   Authorized by:   Storm Frisk MD   Signed by:   Philipp Deputy CMA on 10/09/2010   Method used:   Electronically to        [id number]       [address] [place], [place]  [address]       Ph: [phone]       Fax: [phone]   RxID:   [id number] AUGMENTIN 875-125 MG TABS (AMOXICILLIN-POT CLAVULANATE) 1 by mouth twice a day  #14 x 0   Entered by:   Philipp Deputy CMA   Authorized by:   Storm Frisk MD   Signed by:   Philipp Deputy CMA on 10/09/2010   Method used:   Electronically to        [id number] (retail)       [address] [place], [place]  [address]       Ph: [phone]       Fax: [phone]   RxID:   [id number]

## 2010-12-23 NOTE — Assessment & Plan Note (Signed)
Summary: Pulmonary OV   Primary Provider/Referring Provider:  Dr. [name]  CC:  Follow up on pulmonary nodule.  having SOB with activity that has gotten worse x67months..  History of Present Illness: Pulmonary OV   70 yo WM last seen 2008 in this office for f/u of benign granuloma 4mm in diameter RLL.  THis had not changed over 2 years and no further CTs recommended. Pt now here for new problem of dyspnea occuring over past 6 months.   No real cough.  Notes some wheezes.  Pt notes some ant chest pain.  There is sharp like pain. The chest is sore to touch.  There is no change with deep breath  Pt rx advair per pcp  two months ago.  Now  only uses as needed advair and not on any schedule.    Preventive Screening-Counseling & Management  Alcohol-Tobacco     Smoking Status: quit     Year Quit: 1995     Pack years: 100  Current Medications (verified): 1)  Metoprolol Tartrate 25 Mg Tabs (Metoprolol Tartrate) .... 1/2 Tab Two Times A Day 2)  Omeprazole 20 Mg Cpdr (Omeprazole) .... Two Times A Day 3)  Crestor 5 Mg Tabs (Rosuvastatin Calcium) .... Once Daily 4)  Lisinopril 40 Mg Tabs (Lisinopril) .... Once Daily 5)  Citalopram Hydrobromide 40 Mg Tabs (Citalopram Hydrobromide) .... Once Daily 6)  Bayer Childrens Aspirin 81 Mg Chew (Aspirin) .... Once Daily 7)  Flomax 0.4 Mg Caps (Tamsulosin Hcl) .... Once Daily 8)  Multivitamins  Tabs (Multiple Vitamin) .... Once Daily 9)  Metamucil 30.9 % Powd (Psyllium) .... Once Daily 10)  Fish Oil 1200 Mg Caps (Omega-3 Fatty Acids) .... Two Times A Day 11)  Advair .... As Needed  Allergies (verified): No Known Drug Allergies  Past History:  Past medical, surgical, family and social histories (including risk factors) reviewed, and no changes noted (except as noted below).  Past Medical History: Current Problems:  MYOCARDIAL INFARCTION, HX OF (ICD-412) HYPERTENSION (ICD-401.9) PULMONARY NODULE (ICD-518.89)  Past Surgical History: colon  surgery in 1989  Family History: Reviewed history and no changes required. emphysema-mother asthma-son heart disease-mother  Social History: Reviewed history and no changes required. Patient states former smoker.  Quit in 1985.  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  The patient denies shortness of breath at rest, productive cough, non-productive cough, coughing up blood, irregular heartbeats, acid heartburn, indigestion, loss of appetite, weight change, abdominal pain, difficulty swallowing, sore throat, tooth/dental problems, headaches, nasal congestion/difficulty breathing through nose, sneezing, itching, ear ache, anxiety, depression, hand/feet swelling, joint stiffness or pain, rash, change in color of mucus, and fever.    Vital Signs:  Patient profile:   70 year old male Height:      73 inches Weight:      207.25 pounds BMI:     27.44 O2 Sat:      93 % on Room air Temp:     97.3 degrees F oral Pulse rate:   79 / minute BP sitting:   112 / 78  (left arm) Cuff size:   regular  Vitals Entered By: Gweneth Dimitri RN (December 18, 2009 11:45 AM)  O2 Flow:  Room air CC: Follow up on  pulmonary nodule.  having SOB with activity that has gotten worse x74months. Comments Medications reviewed with patient Daytime contact number verified with patient. Gweneth Dimitri RN  December 18, 2009 11:46 AM    Physical Exam  Additional Exam:  Gen: Pleasant, well nourished, in no distress ENT: no lesions, no postnasal drip Neck: No JVD, no TMG, no carotid bruits Lungs: no use of accessory muscles, no dullness to percussion, distant BS Cardiovascular: RRR, heart sounds normal, no murmurs or gallops, no peripheral edema Musculoskeletal: No deformities, no cyanosis or clubbing     CT  of Chest  Procedure date:  03/11/2007  Findings:      4mm nodule non calcified RLL  No other abnormalities seen  Impression & Recommendations:  Problem # 1:  COPD (ICD-496) Assessment Deteriorated Probable COPD based upon exam and history. as needed Advair is not standard Rx for established copd plan start advair on schedule obtain full pfts   Medications Added to Medication List This Visit: 1)  Metoprolol Tartrate 25 Mg Tabs (Metoprolol tartrate) .... 1/2 tab two times a day 2)  Omeprazole 20 Mg Cpdr (Omeprazole) .... Two times a day 3)  Crestor 5 Mg Tabs (Rosuvastatin calcium) .... Once daily 4)  Lisinopril 40 Mg Tabs (Lisinopril) .... Once daily 5)  Citalopram Hydrobromide 40 Mg Tabs (Citalopram hydrobromide) .... Once daily 6)  Bayer Childrens Aspirin 81 Mg Chew (Aspirin) .... Once daily 7)  Flomax 0.4 Mg Caps (Tamsulosin hcl) .... Once daily 8)  Multivitamins Tabs (Multiple vitamin) .... Once daily 9)  Metamucil 30.9 % Powd (Psyllium) .... Once daily 10)  Fish Oil 1200 Mg Caps (Omega-3 fatty acids) .... Two times a day 11)  Advair  .... As needed 12)  Advair Diskus 250-50 Mcg/dose Misc (Fluticasone-salmeterol) .... One puff twice daily  Complete Medication List: 1)  Metoprolol Tartrate 25 Mg Tabs (Metoprolol tartrate) .... 1/2 tab two times a day 2)  Omeprazole 20 Mg Cpdr (Omeprazole) .... Two times a day 3)  Crestor 5 Mg Tabs (Rosuvastatin calcium) .... Once daily 4)  Lisinopril 40 Mg Tabs (Lisinopril) .... Once daily 5)  Citalopram Hydrobromide 40 Mg Tabs (Citalopram hydrobromide) .... Once daily 6)  Bayer Childrens Aspirin 81 Mg Chew (Aspirin) .... Once daily 7)  Flomax 0.4 Mg Caps (Tamsulosin hcl) .... Once daily 8)  Multivitamins Tabs (Multiple vitamin) .... Once daily 9)  Metamucil 30.9 % Powd (Psyllium) .... Once daily 10)  Fish Oil 1200 Mg Caps (Omega-3 fatty acids) .... Two times a day 11)  Advair Diskus 250-50 Mcg/dose Misc (Fluticasone-salmeterol) .... One  puff twice daily  Other Orders: New Patient Level V (40347) Pulmonary Referral (Pulmonary)  Patient Instructions: 1)  Start Advair 250 one puff twice daily on schedule 2)  Obtain full pulmonary function studies 3)  Return 4-6 weeks Prescriptions: ADVAIR DISKUS 250-50 MCG/DOSE  MISC (FLUTICASONE-SALMETEROL) One puff twice daily  #1 x 6   Entered and Authorized by:   Storm Frisk MD   Signed by:   Storm Frisk MD on 12/18/2009   Method used:   [facility]ron[facility] to        [phone]ail)       [address] [place], [place]  [address]       Ph: [phone]       Fax: [phone]   RxID:   [id number]    Immunization History:  Influenza Immunization History:    Influenza:  historical (  08/23/2009)  Pneumovax Immunization History:    Pneumovax:  historical (08/23/2006)   Appended Document: Pulmonary OV fax [name]

## 2010-12-23 NOTE — Letter (Signed)
Summary: Pre Visit Letter Revised  [facility]  [address] [place], [place] [address]   Phone: [phone]  Fax: [phone]        09/08/2010 MRN: [id number] Jr Flagg [address] [place], [place]  [address]             Procedure Date:  09-30-10   Welcome to the Gastroenterology Division at [facility].    You are scheduled to see a nurse for your pre-procedure visit on 09-16-10 at 10:00a.m. on the 3rd floor at [facility], [address]  We ask that you try to arrive at our office 15 minutes prior to your appointment time to allow for check-in.  Please take a minute to review the attached form.  If you answer "Yes" to one or more of the questions on the first page, we ask that you call the person listed at your earliest opportunity.  If you answer "No" to all of the questions, please complete the rest of the form and bring it to your appointment.    Your nurse visit will consist of discussing your medical and surgical history, your immediate family medical history, and your medications.   If you are unable to list all of your medications on the form, please bring the medication bottles to your appointment and we will list them.  We will need to be aware of both prescribed and over the counter drugs.  We will need to know exact dosage information as well.    Please be prepared to read and sign documents such as consent forms, a financial agreement, and acknowledgement forms.  If necessary, and with your consent, a friend or relative is welcome to sit-in on the nurse visit with you.  Please bring your insurance card so that we may make a copy of it.  If your insurance requires a referral to see a specialist, please bring your referral form from your primary care physician.  No co-pay is required for this nurse visit.     If you cannot keep your appointment, please call [phone] to cancel or reschedule prior to your appointment date.  This  allows [place] the opportunity to schedule an appointment for another patient in need of care.    Thank you for choosing [facility] for your medical needs.  We appreciate the opportunity to care for you.  Please visit [place] at our website  to learn more about our practice.  Sincerely, The Gastroenterology Division

## 2010-12-23 NOTE — Procedures (Signed)
Summary: Colonoscopy  Patient: Corey Tyler Note: All result statuses are Final unless otherwise noted.  Tests: (1) Colonoscopy (COL)   COL Colonoscopy           DONE     [facility]     [address]     [place], [place]  [address]           COLONOSCOPY PROCEDURE REPORT           PATIENT:  Corey Tyler, Corey Tyler  MR#:  [id number]     BIRTHDATE:  1941/05/11, 69 yrs. old  GENDER:  male           ENDOSCOPIST:  Barbette Hair. Arlyce Dice, MD     Referred by:  Charlcie Cradle Delford Field, M.D.           PROCEDURE DATE:  09/30/2010     PROCEDURE:  Colonoscopy with biopsy     ASA CLASS:  Class II     INDICATIONS:  1) unexplained diarrhea           MEDICATIONS:   Fentanyl 50 mcg IV, Versed 5 mg IV           DESCRIPTION OF PROCEDURE:   After the risks benefits and     alternatives of the procedure were thoroughly explained, informed     consent was obtained.  Digital rectal exam was performed and     revealed no abnormalities.   The LB PCF-H180AL [id number] endoscope     was introduced through the anus and advanced to the cecum, which     was identified by both the appendix and ileocecal valve, without     limitations.  The quality of the prep was excellent, using     MoviPrep.  The instrument was then slowly withdrawn as the colon     was fully examined.     <<PROCEDUREIMAGES>>           FINDINGS:  Colitis was found. 5cm segment of colon with reddened,     erythematous mucosa (see image7, image8, and image9). Bxs taken to     r/o colitis  Moderate diverticulosis was found in the sigmoid to     descending colon segments (see image1).  Scattered diverticula     were found.  This was otherwise a normal examination of the colon     (see image3, image4, image6, image12, and image13).   Retroflexed     views in the rectum revealed no abnormalities.    The time to     cecum =  1.75  minutes. The scope was then withdrawn (time =  6.75     min) from the patient and the procedure completed.        COMPLICATIONS:  None           ENDOSCOPIC IMPRESSION:     1) Possible Segmental Colitis     2) Moderate diverticulosis in the sigmoid to descending colon     segments     3) Diverticula, scattered     4) Otherwise normal examination     RECOMMENDATIONS:     1) Await biopsy results     2) call office next 1-3 days to schedule followup visit in 2-3     weeks           REPEAT EXAM:           ______________________________     Barbette Hair. Arlyce Dice, MD  CC:           n.     eSIGNED:   Barbette Hair. Kaplan at 09/30/2010 10:28 AM           Lesle Chris, [id number]  Note: An exclamation mark (!) indicates a result that was not dispersed into the flowsheet. Document Creation Date: 09/30/2010 10:29 AM _______________________________________________________________________  (1) Order result status: Final Collection or observation date-time: 09/30/2010 10:19 Requested date-time:  Receipt date-time:  Reported date-time:  Referring Physician:   Ordering Physician: Melvia Heaps [phone]) Specimen Source:  Source: Launa Grill Order Number: [id number] Lab site:

## 2010-12-25 NOTE — Assessment & Plan Note (Addendum)
Summary: Pulmonary OV   Primary Provider/Referring Provider:  Dr. [name]  CC:  2 month follow up.  Pt states breathing is better - SOB with exertion only, some coughing - prod with white mucus, and wheezing some at night..  History of Present Illness: Pulmonary OV  70 yo WM last seen 2008 in this office for f/u of benign granuloma 4mm in diameter RLL.  THis had not changed over 2 years and no further CTs recommended. Pt now here for new problem of dyspnea occuring over past 6 months.   No real cough.  Notes some wheezes.  Pt notes some ant chest pain.  There is sharp like pain. The chest is sore to touch.  There is no change with deep breath  Pt rx advair per pcp  two months ago.  Now  only uses as needed advair and not on any schedule.  February 03, 2010 10:50 AM At last ov we started Advair 250 one puff twice daily Today the pt did a 6 min walk and went and no desat pt states advair helps  but bp is higher.  197  at times no real cough.  dyspnea is better.  pt tol advair well but inhales too fast on discus  May 13, 2010 11:36 AM The pt notes that since 3/11l  if exerts self is dyspneic,  but does ok.  The advair has helped.  The pt notes the  bp still high with daily advair use so only using advair every 3 days. not using advair daily due to the bp issue. Pt denies any significant sore throat, nasal congestion or excess secretions, fever, chills, sweats, unintended weight loss, pleurtic or exertional chest pain, orthopnea PND, or leg swelling Pt denies any increase in rescue therapy over baseline, denies waking up needing it or having any early am or nocturnal exacerbations of coughing/wheezing/or dyspnea. October 08, 2010 4:47 PM Pt notes more dyspnea today as got up for work.  The pt notes the  cough is worse and more productive.  The  mucus is darker.  There is no fever.  The pt notes some wheezing at night . Overall much worse over the past few days.  December 10, 2010  2:25 PM Pt notes dyspnea is better vs 11/11.  There is no cough now.  Not as much nocturnal wheeze.  No chest pain  Pt denies any significant sore throat, nasal congestion or excess secretions, fever, chills, sweats, unintended weight loss, pleurtic or exertional chest pain, orthopnea PND, or leg swelling Pt denies any increase in rescue therapy over baseline, denies waking up needing it or having any early am or nocturnal exacerbations of coughing/wheezing/or dyspnea.   Current Medications (verified): 1)  Metoprolol Tartrate 25 Mg Tabs (Metoprolol Tartrate) .... 1/2 Tab Two Times A Day 2)  Omeprazole 20 Mg Cpdr (Omeprazole) .... Two Times A Day 3)  Crestor 5 Mg Tabs (Rosuvastatin Calcium) .... Once Daily 4)  Lisinopril 40 Mg Tabs (Lisinopril) .... Once Daily 5)  Citalopram Hydrobromide 20 Mg Tabs (Citalopram Hydrobromide) .... Take 1 Tablet By Mouth Once A Day 6)  Bayer Childrens Aspirin 81 Mg Chew (Aspirin) .... Once Daily 7)  Flomax 0.4 Mg Caps (Tamsulosin Hcl) .... Once Daily 8)  Multivitamins  Tabs (Multiple Vitamin) .... Once Daily 9)  Metamucil 30.9 % Powd (Psyllium) .... Once Daily 10)  Fish Oil 1200 Mg Caps (Omega-3 Fatty Acids) .... Two Times A Day 11)  Advair Diskus 250-50 Mcg/dose  Misc (  Fluticasone-Salmeterol) .... One Puff Twice Daily 12)  Claritin 10 Mg Tabs (Loratadine) .... Take 1 Tablet By Mouth Once A Day  Allergies (verified): 1)  ! Lipitor  Past History:  Past medical, surgical, family and social histories (including risk factors) reviewed, and no changes noted (except as noted below).  Past Medical History: Reviewed history from 12/18/2009 and no changes required. Current Problems:  MYOCARDIAL INFARCTION, HX OF (ICD-412) HYPERTENSION (ICD-401.9) PULMONARY NODULE (ICD-518.89)  Past Surgical History: Reviewed history from 12/18/2009 and no changes required. colon surgery in 1989 SH/Risk Factors reviewed for relevance  Family History: Reviewed history from  12/18/2009 and no changes required. emphysema-mother asthma-son heart disease-mother  Social History: Reviewed history from 12/18/2009 and no changes required. Patient states former smoker.  Quit in 1985.  2 1/2 ppd x 31yrs married 2 children Retired Radio producer,  now works full time at [employer]   Review of Systems       The patient complains of shortness of breath with activity.  The patient denies shortness of breath at rest, productive cough, non-productive cough, coughing up blood, chest pain, irregular heartbeats, acid heartburn, indigestion, loss of appetite, weight change, abdominal pain, difficulty swallowing, sore throat, tooth/dental problems, headaches, nasal congestion/difficulty breathing through nose, sneezing, itching, ear ache, anxiety, depression, hand/feet swelling, joint stiffness or pain, rash, change in color of mucus, and fever.    Vital Signs:  Patient profile:   70 year old male Height:      73 inches Weight:      204 pounds BMI:     27.01 O2 Sat:      95 % on Room air Temp:     97.4 degrees F oral Pulse rate:   70 / minute BP sitting:   110 / 76  (right arm) Cuff size:   regular  Vitals Entered By: Gweneth Dimitri RN (December 10, 2010 2:01 PM)  O2 Flow:  Room air CC: 2 month follow up.  Pt states breathing is better - SOB with exertion only, some coughing - prod with white mucus, wheezing some at night. Comments Medications reviewed with patient Daytime contact number verified with patient. Gweneth Dimitri RN  December 10, 2010 2:03 PM    Physical Exam  Additional Exam:  Gen: Pleasant, well nourished, in no distress ENT: no lesions, no postnasal drip Neck: No JVD, no TMG, no carotid bruits Lungs: no use of accessory muscles, no dullness to percussion,. fair  airflow Cardiovascular: RRR, heart sounds normal, no murmurs or gallops, no peripheral edema Musculoskeletal: No deformities, no cyanosis or clubbing     Impression &  Recommendations:  Problem # 1:  COPD (ICD-496) Assessment Improved  copd improved plan No change in inhaled medications.   Maintain treatment program as currently prescribed.  Complete Medication List: 1)  Metoprolol Tartrate 25 Mg Tabs (Metoprolol tartrate) .... 1/2 tab two times a day 2)  Omeprazole 20 Mg Cpdr (Omeprazole) .... Two times a day 3)  Crestor 5 Mg Tabs (Rosuvastatin calcium) .... Once daily 4)  Lisinopril 40 Mg Tabs (Lisinopril) .... Once daily 5)  Citalopram Hydrobromide 20 Mg Tabs (Citalopram hydrobromide) .... Take 1 tablet by mouth once a day 6)  Bayer Childrens Aspirin 81 Mg Chew (Aspirin) .... Once daily 7)  Flomax 0.4 Mg Caps (Tamsulosin hcl) .... Once daily 8)  Multivitamins Tabs (Multiple vitamin) .... Once daily 9)  Metamucil 30.9 % Powd (Psyllium) .... Once daily 10)  Fish Oil 1200 Mg Caps (Omega-3 fatty acids) .Marland KitchenMarland KitchenMarland Kitchen  Two times a day 11)  Advair Diskus 250-50 Mcg/dose Misc (Fluticasone-salmeterol) .... One puff twice daily 12)  Claritin 10 Mg Tabs (Loratadine) .... Take 1 tablet by mouth once a day  Patient Instructions: 1)  No change in medications 2)  Return in      4-5     months Prescriptions: ADVAIR DISKUS 250-50 MCG/DOSE  MISC (FLUTICASONE-SALMETEROL) One puff twice daily  #1 x 11   Entered and Authorized by:   Storm Frisk MD   Signed by:   Storm Frisk MD on 12/10/2010   Method used:   Electronically to        [facility] # [phone]* (retail)       [address] [place], [place]  [address]       Ph: [phone]       Fax: [phone]   RxID:   [id number]     Appended Document: Pulmonary OV fax [name]

## 2010-12-31 NOTE — Miscellaneous (Signed)
Summary: Orders Update   Clinical Lists Changes  Orders: Added new Service order of Est. Patient Level IV (99214) - Signed 

## 2011-03-10 ENCOUNTER — Other Ambulatory Visit: Payer: Self-pay | Admitting: Critical Care Medicine

## 2011-04-07 NOTE — Discharge Summary (Signed)
Corey Tyler, Corey Tyler                ACCOUNT NO.:  [id number]   MEDICAL RECORD NO.:  [id number]          PATIENT TYPE:  INP   LOCATION:  1442                         FACILITY:  [facility]   PHYSICIAN:  Madaline Savage, MD        DATE OF BIRTH:  [birth year]   DATE OF ADMISSION:  04/27/2007  DATE OF DISCHARGE:  04/29/2007                               DISCHARGE SUMMARY   PRIMARY CARE PHYSICIAN:  Dr. [facility].   DATE OF CONSULTATION:  In the hospital:  Dr. Virginia Rochester, gastroenterology.   DISCHARGE DIAGNOSES:  1. Atypical chest pain.  2. Gastroesophageal reflux disease.  3. Hypotension.  4. Chronic allergies.  5. History of chronic headaches.  6. History of lung nodules.   DISCHARGE MEDICATIONS:  1. Aspirin 81 mg daily.  2. Flomax 0.4 mg daily.  3. Zocor 20 mg daily.  4. Metoprolol 20 mg daily.  5. Lisinopril 20 mg daily.  6. Omeprazole 20 mg daily.  7. Celexa 20 mg daily.   PROCEDURES DONE AT THE HOSPITAL:  He had an upper endoscopy done by Dr.  Virginia Rochester on April 28, 2007, which showed widely patent gastroesophageal  junction indicating a laxity of the lower esophageal sphincter and  question of Barrett's.  Biopsy was done, delayed biopsy report.   BRIEF SUMMARY:  He has a echocardiogram done on April 28, 2007 which  showed mildly decreased left ventricular systolic funtion, with EF of  06%  , mild MR, small pericardial effusion.   HISTORY OF PRESENT ILLNESS:  For a full history and physical, see the  history and physical dictated by Dr. Darene Lamer on April 27, 2007.  In short,  Corey Tyler is a 70 year old gentleman with a history of hypertension,  questionable history of coronary artery disease who comes in with an  atypical chest pain, since he was woke up from morning.  The pain came  on at rest, and he feels like something is stuck in his chest.  There  was no radiation, no evidence shortness of breath, nausea, diaphoresis  or sweating.  He was admitted to rule out an acute coronary  syndrome.   PROBLEM LIST:  1. Atypical chest pain.  Mr. Cudworth was admitted, and we did 3 sets of      cardiac markers which are all negative.  He had EKGs which did not      show any acute ST or T-wave changes.  We ordered an echocardiogram      which did show that he had a lower ejection fraction, but we do not      have an old echocardiogram to compare this to.  He states that he      was told that he had an MI in the 1980s, and this could be      secondary to it.  He will definitely need further workup for the      lowering systolic function.  Because he had atypical chest pain and      from the history it sounded more like gastro and sternal kind of  chest pain, we consulted gastroenterology Dr. Virginia Rochester, who did      endoscopy.  Endoscopy showed that he had a laxity of      gastroesophageal junction, and there was a question of Barrett.      Biopsies were done, and he will need to follow up with Dr. Virginia Rochester for      the results of the biopsy.  He also had an echocardiogram which      showed that he had a small pericardial effusion, but the history      was not suggestive of pericarditis.  At the time of discharge, his      chest pain has completely resolved.  This could either be secondary      to the gastroesophageal reflux or could be pericarditis which is      resolved.  2. Hypertension.  His blood pressure was stable in the hospital on his      home regimen.  I have recommended him to continue on the same dose.   DISPOSITION:  He is now being discharged home in stable condition.   FOLLOWUP:  I have asked him to follow up with his primary care doctor in  one week's time.   RECOMMENDATIONS:  1. He needs to be followed further regarding his decreased ejection      fraction.  He may need outpatient stress test for risk      stratification.  2. He needs to follow with Dr. Virginia Rochester, regarding the biopsy report of his      EGD.      Madaline Savage, MD  Electronically Signed      PKN/MEDQ  D:  04/30/2007  T:  04/30/2007  Job:  [id number]   cc:   Georgiana Spinner, M.D.  Fax: [phone]

## 2011-04-07 NOTE — Op Note (Signed)
Corey Tyler, Corey Tyler                ACCOUNT NO.:  [id number]   MEDICAL RECORD NO.:  [id number]          PATIENT TYPE:  INP   LOCATION:  1442                         FACILITY:  [facility]   PHYSICIAN:  Georgiana Spinner, M.D.    DATE OF BIRTH:  June 21, 1941   DATE OF PROCEDURE:  DATE OF DISCHARGE:                               OPERATIVE REPORT   PROCEDURES:  Upper endoscopy.   INDICATIONS:  Vomiting, nausea, chest and abdominal pain.   ANESTHESIA:  Demerol 50 mg, Versed 4 mg.   PROCEDURE:  With the patient mildly sedated in the left lateral  decubitus position, the Pentax videoscopic endoscope was inserted in the  mouth and passed under direct vision through the esophagus which  appeared normal.  Distal esophagus however, would not widely open so I  could not tell if there was Barrett's.  I elected to photograph and  biopsy around the perimeter of the squamocolumnar junction.  We entered  into the stomach. Fundus, body, antrum appeared normal as did duodenal  bulb, second portion duodenum.  From this point the endoscope was slowly  withdrawn taking circumferential views of duodenal mucosa.  The  endoscope was pulled back into the stomach, placed in retroflexion to  view the stomach from below.  The endoscope was straightened and  withdrawn, taking circumferential views of the remaining gastric and  esophageal mucosa.  The patient's vital signs, pulse oximeter remained  stable.  The patient tolerated the procedure well without apparent  complications.   FINDINGS:  Widely patent GE junction, indicating laxity of the lower  esophageal sphincter and question of Barrett's.  Await biopsy report.  The patient will call me as an outpatient for results and follow-up with  me as an outpatient as needed.           ______________________________  Georgiana Spinner, M.D.     GMO/MEDQ  D:  04/29/2007  T:  04/29/2007  Job:  [id number]

## 2011-04-07 NOTE — H&P (Signed)
Corey Tyler, Corey Tyler                ACCOUNT NO.:  [id number]   MEDICAL RECORD NO.:  [id number]          PATIENT TYPE:  INP   LOCATION:  1442                         FACILITY:  [facility]   PHYSICIAN:  Madaline Savage, MD        DATE OF BIRTH:  1941-03-29   DATE OF ADMISSION:  04/27/2007  DATE OF DISCHARGE:                              HISTORY & PHYSICAL   PRIMARY CARE PHYSICIAN:  Dr. Selena Batten, with the [facility].   CHIEF COMPLAINT:  Chest discomfort.   HISTORY OF PRESENT ILLNESS:  The patient is a 70 year old gentleman with  a history of hypertension, tobacco abuse and a questionable history of  coronary artery disease in the past, who comes in with chest discomfort.  His chest discomfort started approximately at 6:30 in the morning when  he woke up from sleep.  He states he feels like as if there is a golf  ball stuck in his chest.  He denies any radiation of the discomfort.  He  denies any associated shortness of breath, nausea or diaphoresis.  He  does state that there is change with the discomfort with a deep breath.  This discomfort has lasted since 6:30 in the morning for the last four  hours.  He was given some nitroglycerin in the ED which has helped ease  it a little bit.  He states that he is fairly active at home.  He works  at [employer] and helps move heavy stuff, but he has not had any chest  pain or discomfort with activity.   PAST MEDICAL HISTORY:  1. Questionable history of coronary artery disease.  He thinks that he      has been told in the past that he had a heart attack in the 1980's.      He is not sure about it.  He states that he had a cardiac      catheterization approximately 10 years ago which was apparently      normal.  2. Hypertension.  3. History of gastroesophageal reflux disease.  He had an EGD in 2006      which was normal.  4. Chronic allergies.  5. History of lung nodule.  He sees [facility].  6. History of chronic headaches.   PAST  SURGICAL HISTORY:  1. Diverticular surgery in 1989.  2. Appendectomy.   ALLERGIES:  NO KNOWN DRUG ALLERGIES.   CURRENT MEDICATIONS:  1. He is on aspirin 81 mg daily.  2. Flomax 0.4 mg daily.  3. Zocor 20 mg daily.  4. Metoprolol 25 mg daily.  5. Lisinopril 20 mg daily.  6. Omeprazole 20 mg daily.  7. Celexa 20 mg daily.   SOCIAL HISTORY:  He lives at home.  He has a history of heavy smoking in  the past.  He has smoked 2-1/2 to 3 packs per day for 42 years, but quit  smoking 10 years ago.  He works at [employer].   FAMILY HISTORY:  There is a history of coronary artery disease in his  older brother and mother.  Cancer  in his older brother.   REVIEW OF SYSTEMS:  GENERAL:  He denies any recent weight loss, but  again no fevers or chills.  HEENT:  No headaches, no blurred vision, no  sore throat.  CARDIOVASCULAR SYSTEM:  He does complaining of chest pain.  No palpitations.  RESPIRATORY SYSTEM:  No shortness of breath or cough.  GASTROINTESTINAL SYSTEM:  No abdominal pain, nausea or vomiting,  diarrhea or constipation.   PHYSICAL EXAMINATION:  GENERAL:  He is alert and oriented x3.  VITAL SIGNS:  Temperature is 98.6, pulse rate of 68, blood pressure  141/73.  Oxygen saturation is 94% on room air.  HEENT:  Head is normocephalic, atraumatic.  Pupils are bilaterally equal  and reactive to light.  Mucous membranes are moist.  NECK:  Neck is supple.  No JVD or lymphadenopathy.  CARDIOVASCULAR SYSTEM: S1, S2 heard.  Regular rate and rhythm.  No  murmurs or S4 gallops.  LUNGS:  Clear to auscultation.  ABDOMEN:  Abdomen is soft.  Bowel sounds are heard.  EXTREMITIES:  No clubbing, cyanosis or edema.   LABORATORY DATA:  Labs show a hemoglobin of 13.4, hematocrit 39.8 white  blood cell count of 11,000.  D-dimer is 0.22, INR is 1.  Sodium is 135,  potassium 3,5, BUN is 16, creatinine of 1.01.  His troponin is less than  0.01.   Electrocardiogram shows normal sinus rhythm, Q-waves in  the inferior  leads.   IMPRESSION:  1. Atypical chest pain.  2. History of gastroesophageal reflux disease.  3. Hypertension.  4. Chronic headaches.   PLAN:  1. Atypical chest pain.  This is a 70 year old gentleman who comes in      with atypical chest pain.  His risk factors for coronary artery      disease are hypertension and smoking.  There is a questionable      history of coronary artery disease but he states he had a clean      catheterization approximately 10 years ago.  At this time, his      chest pain is atypical.  He did have a slight improvement with      administration of nitroglycerin, but the rest of his features of      his chest pain do not sound like cardiac chest pain.  I would admit      him to rule out an cute coronary syndrome with three sets of      cardiac markers.  I will also get an EKG again tomorrow morning.  I      have discussed with the family and there is a possibility that this      is likely secondary to his gastroesophageal reflux disease.  I will      put him on a proton pump inhibitor at this time and if he still      continues to have discomfort, I will plan to consult GI once an      acute coronary syndrome is ruled out.  2. History of hypertension.  His blood pressure is stable at this      time.  I will continue him on his home dose of medications.  3. I will put him on DVT and GI prophylaxis.      Madaline Savage, MD  Electronically Signed     PKN/MEDQ  D:  04/27/2007  T:  04/27/2007  Job:  [id number]

## 2011-04-10 NOTE — Op Note (Signed)
Corey Tyler, Corey Tyler                ACCOUNT NO.:  [id number]   MEDICAL RECORD NO.:  [id number]          PATIENT TYPE:  AMB   LOCATION:  ENDO                         FACILITY[facility]  [facility]   PHYSICIAN:  Georgiana Spinner, M.D.    DATE OF BIRTH:  1941/08/01   DATE OF PROCEDURE:  09/11/2005  DATE OF DISCHARGE:                                 OPERATIVE REPORT   PROCEDURE:  Upper endoscopy.   INDICATIONS:  Gastroesophageal reflux disease.   ANESTHESIA:  Demerol 50, Versed 6 mg.   PROCEDURE:  With the patient mildly sedated in the left lateral decubitus  position, the Olympus videoscopic endoscope was inserted in the mouth,  passed under direct vision through the esophagus which appeared normal. I  saw no direct evidence of Barrett's esophagus. We entered in the stomach.  The fundus, body, antrum, duodenal bulb, and second portion of duodenum were  visualized. Because of the patient's diarrhea, I elected to take a small  bowel biopsy. The endoscope was then withdrawn taking circumferential views  of the duodenal mucosa until the endoscope had been pulled back into the  stomach, placed in retroflexion to view the stomach from below and again  this appeared normal but we elected to after straightening the endoscope and  withdrawing and taking circumferential views of the remaining gastric and  esophageal mucosa to biopsy the squamocolumnar junction. The patient's vital  signs and pulse oximeter remained stable. The patient tolerated the  procedure well without apparent complications.   FINDINGS:  Fairly unremarkable examination with biopsies taken, await biopsy  report. The patient will call me for results and follow-up with me as an  outpatient. Proceed to colonoscopy.           ______________________________  Georgiana Spinner, M.D.     GMO/MEDQ  D:  09/11/2005  T:  09/11/2005  Job:  [id number]

## 2011-04-10 NOTE — Procedures (Signed)
[facility]  Patient:    Corey Tyler, Corey Tyler Visit Number: [id number] MRN: [id number]          Service Type: END Location: ENDO Attending Physician:  Sabino Gasser Adm. Date:  07/20/2001                             Procedure Report  PROCEDURE:  Colonoscopy.  SURGEON:  Sabino Gasser, M.D.  INDICATIONS:  Rectal bleeding and colon polyps.  ANESTHESIA:  Demerol 70 and Versed 5 mg.  DESCRIPTION OF PROCEDURE:  With the patient mildly sedated in the left lateral decubitus position, the Olympus videoscopic colonoscope was inserted in the rectum after a normal rectal exam, and passed under direct vision to the cecum, identified by the ileocecal valve and appendiceal orifice, both of which were photographed.  From this point, the colonoscope was slowly withdrawn, taking circumferential views of the entire colonic mucosa, stopping first at 50 cm from the anal verge, and proceeding to 30 cm from the anal verge, at which point, a diffuse patchy colitis was noted.  There was some erythema consistent with mucosal hemorrhage, and all of this was consistent with a localized colitis that was photographed.  Multiple biopsies were taken. Of note, there were diverticula seen in this area, and there was one diverticulum that appeared to be impacted by stool.  Whether this is positive is not certain.  The endoscope after we took biopsies was withdrawn to the rectum which appeared normal on direct view and showed internal hemorrhoids on retroflexed view.  The endoscope was straightened and withdrawn.  The patients vital signs and pulse oximeter remained stable.  The patient tolerated the procedure well and without apparent complications.  FINDINGS:  Diffuse colitis involving the colon between 30 cm and 50 cm from the anal verge with diverticulosis noted at this point as well.  This may be a finding consistent with diverticulitis.  PLAN:  Await biopsy report and follow patients  clinical course.  The patient will call me for results of biopsy, and follow up with me as an outpatient. Attending Physician:  Sabino Gasser DD:  07/20/01 TD:  07/20/01 Job: [id number]

## 2011-04-10 NOTE — Procedures (Signed)
[facility]  Patient:    Corey Tyler, Corey Tyler Visit Number: [id number] MRN: [id number]          Service Type: END Location: ENDO Attending Physician:  Sabino Gasser Dictated by:   Sabino Gasser, M.D. Proc. Date: 09/22/01 Admit Date:  09/22/2001                             Procedure Report  PROCEDURE:  Upper endoscopy with biopsy.  INDICATIONS:  Gastroesophageal reflux disease.  ANESTHESIA:  Demerol 50 mg, Versed 5 mg.  PROCEDURE:  With the patient mildly sedated in the left lateral decubitus position, the Olympus videoscopic endoscope was inserted in the mouth, passed under direct vision through the esophagus.  The distal esophagus was approached, and showed a question of an area of Barretts.  It was photographed and biopsied.  We entered into the stomach.  The fundus body, antrum, duodenal bulb were all well visualized.  Photographs were taken.  From this point the endoscope was slowly withdrawn, taking circumferential views of the entire duodenal mucosa until the endoscope had been pulled back into the stomach and placed in retroflexion to view the stomach from below.  A hiatal hernia was seen, as evidenced by incomplete graft to the JE junction around the endoscope. The endoscope was then straightened and withdrawn, taking circumferential views of the remaining gastric and esophageal mucosa.  The patients vital signs and pulse oximetry remained stable.  The patient tolerated the procedure well and without apparent complications.  FINDINGS:  Question of Barretts esophagus, biopsied.  PLAN:  Will have the patient follow me for results of biopsy and follow up with me as an outpatient. Dictated by:   Sabino Gasser, M.D. Attending Physician:  Sabino Gasser DD:  09/22/01 TD:  09/22/01 Job: [id number]

## 2011-04-10 NOTE — Assessment & Plan Note (Signed)
[facility]                             PULMONARY OFFICE NOTE   LEDELL, CODRINGTON                       MRN:          [id number]  DATE:03/29/2007                            DOB:          1941/10/26    CHIEF COMPLAINT:  Pulmonary nodule.   HISTORY OF PRESENT ILLNESS:  This is a 70 year old male who is referred  for evaluation of a pulmonary nodule.  He initially had a dry cough for  about 1 year.  His primary care physician obtained a chest x-ray, and  felt there were abnormalities there.  He was sent for a CT scan, which  shows a nodule in the right lower lobe, 4 mm in diameter.  He is  referred for further evaluation.  He has had pneumonia in the past, but  not recently.  He denies chest pain.  He is short of breath if he exerts  himself up a hill.  He does have a dry cough without mucus production.  There is no hemoptysis.  He had lost weight from 225 pounds down to 200  pounds, but he attributes this to working at [employer].  He has no acid  reflux.  No loss of appetite.  No abdominal pain.  There has been no  difficulty swallowing.  No sore throat, headaches, nasal congestion,  sneezing, itching, earache, anxiety, depression, hand or foot swelling.   PAST MEDICAL HISTORY:  Medical history:  1. Hypertension.  2. Heart disease with an MI.  3. History of chronic allergies.  4. Chronic headaches.  5. No history of heart dysrhythmias, asthma, emphysema, diabetes,      increased cholesterol, stroke, blood clots, cancer.  No history of      sleep apnea or disorders of the central nervous system.   Operative history:  1. Colon surgery in 1989 with diverticulitis.  2. Appendectomy in the past.   MEDICATION ALLERGIES:  NONE.   CURRENT MEDICATIONS:  1. Caltrate daily.  2. Aspirin 81 mg daily.  3. Flomax 0.4 mg daily.  4. Simvastatin 20 mg daily.  5. Metoprolol 25 mg daily.  6. Lisinopril 20 mg daily.  7. Omeprazole 20 mg daily.  8.  Citalopram 20 mg daily.   SOCIAL HISTORY:  Patient smoked 2-1/2 to 3 packs a day for 42 years.  Quit smoking 10 years ago.  He works at [employer].  Lives at home with  his wife.   FAMILY HISTORY:  Positive emphysema in mother.  Heart disease in older  brother and mother.  Cancer in older brother.   REVIEW OF SYSTEMS:  Otherwise noncontributory.   PHYSICAL EXAMINATION:  This is a middle-aged male, in no distress.  Temperature 98.  Blood pressure 116/80.  Pulse 81.  Saturation 92% room  air.  CHEST:  Showed diminished breath sounds with no evidence of wheeze,  rale, or rhonchi.  CARDIAC EXAM:  Showed a regular rate and rhythm without S3.  Normal S1  and S2.  ABDOMEN:  Soft, non-tender.  EXTREMITIES:  Showed no edema, clubbing, or venous disease.  SKIN:  Was clear.  NEUROLOGIC EXAM:  Was intact.  HEENT EXAM:  Showed no jugular venous distention.  No lymphadenopathy.  Oropharynx was clear.  NECK:  Was supple.  CT scan of the chest was obtained and reviewed, and revealed  emphysematous change without nodule corresponding to plain film  abnormality seen.  There is a right lower lobe nodule in a 4 mm diameter  just at the vessel branch point.  This is noncalcified.   IMPRESSION:  Is that of likely benign nodule right lower lobe, too small  to biopsy for characterize at this time.   RECOMMENDATIONS:  Follow up with another CT scan in 9 months.  This has  been scheduled at this time.  No other recommendations are made.     Charlcie Cradle Delford Field, MD, [facility]  Electronically Signed    PEW/MedQ  DD: 03/30/2007  DT: 03/30/2007  Job #: [id number]   cc:   [name], MD

## 2011-04-10 NOTE — Op Note (Signed)
NAMEDELFORD, WINGERT                ACCOUNT NO.:  [id number]   MEDICAL RECORD NO.:  [id number]          PATIENT TYPE:  AMB   LOCATION:  ENDO                         FACILITY:  [facility]   PHYSICIAN:  Georgiana Spinner, M.D.    DATE OF BIRTH:  1941/07/17   DATE OF PROCEDURE:  09/11/2005  DATE OF DISCHARGE:                                 OPERATIVE REPORT   PROCEDURE:  Colonoscopy.   INDICATIONS:  Diarrhea, hemoccult positivity.   ANESTHESIA:  None further given.   PROCEDURE:  With the patient mildly sedated in the left lateral decubitus  position, the Olympus videoscopic colonoscope was inserted into the rectum  and passed under direct vision to the cecum identified by ileocecal valve  and appendiceal orifice both of which were photographed. From this point, we  took biopsies of the area because it appeared to possibly be somewhat  mottled consistent with colitis. From this point, the colonoscope was slowly  withdrawn taking circumferential views of colonic mucosa stopping only in  the rectum which appeared normal on direct and retroflexed view showed  hemorrhoids. The endoscope was straightened and withdrawn. The patient's  vital signs and pulse oximeter remained stable. The patient tolerated the  procedure well without apparent complications.   FINDINGS:  Internal hemorrhoids, diverticulosis of the sigmoid colon,  question of colitis. We attempted to get into the terminal ileum and we  could open the valve but not get the scope to get in.   PLAN:  Await biopsy report. The patient will call me for results and follow-  up with me as an outpatient.           ______________________________  Georgiana Spinner, M.D.     GMO/MEDQ  D:  09/11/2005  T:  09/11/2005  Job:  [id number]

## 2011-05-01 ENCOUNTER — Encounter: Payer: Self-pay | Admitting: Critical Care Medicine

## 2011-05-04 ENCOUNTER — Encounter: Payer: Self-pay | Admitting: Critical Care Medicine

## 2011-05-04 ENCOUNTER — Ambulatory Visit (INDEPENDENT_AMBULATORY_CARE_PROVIDER_SITE_OTHER): Payer: BC Managed Care – PPO | Admitting: Critical Care Medicine

## 2011-05-04 VITALS — BP 124/50 | HR 82 | Temp 98.2°F | Ht 73.0 in | Wt 209.8 lb

## 2011-05-04 DIAGNOSIS — J449 Chronic obstructive pulmonary disease, unspecified: Secondary | ICD-10-CM

## 2011-05-04 NOTE — Assessment & Plan Note (Signed)
Stable Golds stage III Copd Plan No change in inhaled or maintenance medications. Return in

## 2011-05-04 NOTE — Progress Notes (Signed)
Subjective:    Patient ID: Corey Tyler, male    DOB: 12-23-1940, 70 y.o.   MRN: [id number]  HPI 70 y.o.    WM Golds Stage III Copd   05/04/2011 Since OV 1/12,  Had pain in back and shoulders and stopped the omeprazole and feels better.  Now notes mild cough.  No real chest pain.  Notes sl wheeze at night.  No f/c/s.  No edema in feet.  Past Medical History  Diagnosis Date  . Old myocardial infarction   . Unspecified essential hypertension   . Other diseases of lung, not elsewhere classified   . COPD (chronic obstructive pulmonary disease)      Family History  Problem Relation Age of Onset  . Emphysema Mother   . Heart disease Mother      History   Social History  . Marital Status: Married    Spouse Name: N/A    Number of Children: N/A  . Years of Education: N/A   Occupational History  . Retired    Social History Main Topics  . Smoking status: Former Smoker -- 2.5 packs/day for 40 years    Types: Cigarettes    Quit date: 11/24/1983  . Smokeless tobacco: Never Used   Comment: 2 1/2 ppd x 40 years  . Alcohol Use: Not on file  . Drug Use: Not on file  . Sexually Active: Not on file   Other Topics Concern  . Not on file   Social History Narrative  . No narrative on file     Allergies  Allergen Reactions  . Atorvastatin     REACTION: cough     Outpatient Prescriptions Prior to Visit  Medication Sig Dispense Refill  . ADVAIR DISKUS 250-50 MCG/DOSE AEPB INHALE 1 PUFF BY MOUTH TWICE A DAY  1 each  6  . aspirin 81 MG tablet Take 81 mg by mouth daily.        . citalopram (CELEXA) 20 MG tablet Take 20 mg by mouth daily.        Marland Kitchen lisinopril (PRINIVIL,ZESTRIL) 40 MG tablet Take 40 mg by mouth daily.        Marland Kitchen loratadine (CLARITIN) 10 MG tablet Take 10 mg by mouth daily.        . metoprolol succinate (TOPROL-XL) 25 MG 24 hr tablet 1/2 two times a day       . Multiple Vitamin (MULTIVITAMIN) capsule Take 1 capsule by mouth daily.        . Omega-3 Fatty Acids (FISH  OIL) 1200 MG CAPS Take 1 capsule by mouth 2 (two) times daily.        . psyllium (METAMUCIL) 58.6 % powder Take 1 packet by mouth daily.        . rosuvastatin (CRESTOR) 5 MG tablet Take 5 mg by mouth daily.        . Tamsulosin HCl (FLOMAX) 0.4 MG CAPS Take 0.4 mg by mouth daily.        Marland Kitchen omeprazole (PRILOSEC) 20 MG capsule Take 20 mg by mouth 2 (two) times daily.            Review of Systems Constitutional:   No  weight loss, night sweats,  Fevers, chills, fatigue, lassitude. HEENT:   No headaches,  Difficulty swallowing,  Tooth/dental problems,  Sore throat,                No sneezing, itching, ear ache, nasal congestion, post nasal drip,   CV:  No  chest pain,  Orthopnea, PND, swelling in lower extremities, anasarca, dizziness, palpitations  GI  No heartburn, indigestion, abdominal pain, nausea, vomiting, diarrhea, change in bowel habits, loss of appetite  Resp: Notes  shortness of breath with exertion not  at rest.  No excess mucus, no productive cough,  Notes  non-productive cough,  No coughing up of blood.  No change in color of mucus.  No wheezing.  No chest wall deformity  Skin: no rash or lesions.  GU: no dysuria, change in color of urine, no urgency or frequency.  No flank pain.  MS:  No joint pain or swelling.  No decreased range of motion.  No back pain.  Psych:  No change in mood or affect. No depression or anxiety.  No memory loss.     Objective:   Physical Exam Filed Vitals:   05/04/11 1438  BP: 124/50  Pulse: 82  Temp: 98.2 F (36.8 C)  TempSrc: Oral  Height: 6\' 1"  (1.854 m)  Weight: 209 lb 12.8 oz (95.165 kg)  SpO2: 94%    Gen: Pleasant, well-nourished, in no distress,  normal affect  ENT: No lesions,  mouth clear,  oropharynx clear, no postnasal drip  Neck: No JVD, no TMG, no carotid bruits  Lungs: No use of accessory muscles, no dullness to percussion, distant BS  Cardiovascular: RRR, heart sounds normal, no murmur or gallops, no peripheral  edema  Abdomen: soft and NT, no HSM,  BS normal  Musculoskeletal: No deformities, no cyanosis or clubbing  Neuro: alert, non focal  Skin: Warm, no lesions or rashes      PFT Conversion 02/10/2010  FVC 4.66  FVC PREDICT 4.84  FVC  % Predicted 96  FEV1 2.19  FEV1 PREDICT 3.25  FEV % Predicted 68  FEV1/FVC 47  FEV1/FVC PRE 68  FeF 25-75 0.41  FeF 25-75 % Predicted 2.84  FEF % EXPEC 14  POST FVC 4.69  FVCPRDPST 4.84  POST FVC%EXP 97  POST FEV1 2.44  FEV1PRDPST 3.25  POSTFEV1%PRD 75  PSTFEV1/FVC 52  FEV1FVCPRDPS 68  PSTFEF25/75% 0.67  PSTFEF25/75P 2.84  FEF2575%EXPS 24  Residual Volume (ml) 3.25  RV % EXPECT 119  DLCO (ml/mmHg sec) 13.5  DLCO % EXPEC 56  DLCO/VA 2.30  DLCO/VA%EXP 65  PFT RSLT moderate obstruction with significant bronchodilator response    Assessment & Plan:   COPD Stable Golds stage III Copd Plan No change in inhaled or maintenance medications. Return in      Updated Medication List Outpatient Encounter Prescriptions as of 05/04/2011  Medication Sig Dispense Refill  . ADVAIR DISKUS 250-50 MCG/DOSE AEPB INHALE 1 PUFF BY MOUTH TWICE A DAY  1 each  6  . aspirin 81 MG tablet Take 81 mg by mouth daily.        . citalopram (CELEXA) 20 MG tablet Take 20 mg by mouth daily.        . fluticasone (FLONASE) 50 MCG/ACT nasal spray Place 1 spray into the nose daily.        Marland Kitchen lisinopril (PRINIVIL,ZESTRIL) 40 MG tablet Take 40 mg by mouth daily.        Marland Kitchen loratadine (CLARITIN) 10 MG tablet Take 10 mg by mouth daily.        . metoprolol succinate (TOPROL-XL) 25 MG 24 hr tablet 1/2 two times a day       . Multiple Vitamin (MULTIVITAMIN) capsule Take 1 capsule by mouth daily.        . Omega-3 Fatty Acids (  FISH OIL) 1200 MG CAPS Take 1 capsule by mouth 2 (two) times daily.        Marland Kitchen omeprazole (PRILOSEC) 20 MG capsule Take 20 mg by mouth daily.        . psyllium (METAMUCIL) 58.6 % powder Take 1 packet by mouth daily.        . rosuvastatin (CRESTOR) 5 MG  tablet Take 5 mg by mouth daily.        . Tamsulosin HCl (FLOMAX) 0.4 MG CAPS Take 0.4 mg by mouth daily.        Marland Kitchen DISCONTD: omeprazole (PRILOSEC) 20 MG capsule Take 20 mg by mouth 2 (two) times daily.

## 2011-05-04 NOTE — Patient Instructions (Signed)
No change in medications. Return in        6 months        

## 2011-08-24 LAB — URINALYSIS, ROUTINE W REFLEX MICROSCOPIC
Bilirubin Urine: NEGATIVE
Nitrite: NEGATIVE
Specific Gravity, Urine: 1.008
Urobilinogen, UA: 0.2
pH: 6

## 2011-08-24 LAB — CBC
HCT: 44.7
Platelets: 203
RDW: 13.2
WBC: 9.8

## 2011-08-24 LAB — POCT I-STAT, CHEM 8
Chloride: 106
Glucose, Bld: 81
HCT: 45
Hemoglobin: 15.3
Potassium: 4.4

## 2011-08-24 LAB — DIFFERENTIAL
Basophils Absolute: 0.1
Eosinophils Relative: 2
Lymphocytes Relative: 21
Lymphs Abs: 2
Neutro Abs: 6.9
Neutrophils Relative %: 70

## 2011-08-24 LAB — OCCULT BLOOD X 1 CARD TO LAB, STOOL: Fecal Occult Bld: POSITIVE

## 2011-09-10 LAB — HEPATIC FUNCTION PANEL
Alkaline Phosphatase: 56
Bilirubin, Direct: 0.2
Indirect Bilirubin: 0.8
Total Protein: 6.1

## 2011-09-10 LAB — DIFFERENTIAL
Lymphocytes Relative: 15
Lymphs Abs: 1.7
Monocytes Relative: 5
Neutrophils Relative %: 76

## 2011-09-10 LAB — TROPONIN I: Troponin I: 0.02

## 2011-09-10 LAB — CBC
HCT: 39.8
Hemoglobin: 12.9 — ABNORMAL LOW
Platelets: 179
RBC: 4.07 — ABNORMAL LOW
RBC: 4.22
WBC: 11 — ABNORMAL HIGH

## 2011-09-10 LAB — LIPID PANEL
HDL: 39 — ABNORMAL LOW
Total CHOL/HDL Ratio: 3
VLDL: 17

## 2011-09-10 LAB — LIPASE, BLOOD: Lipase: 17

## 2011-09-10 LAB — CK TOTAL AND CKMB (NOT AT ARMC)
CK, MB: 1.2
CK, MB: 2
Relative Index: INVALID
Total CK: 55
Total CK: 97

## 2011-09-10 LAB — POCT CARDIAC MARKERS
CKMB, poc: <1 — ABNORMAL LOW
CKMB, poc: <1 — ABNORMAL LOW
Operator id: 4001
Troponin i, poc: <0.05

## 2011-09-10 LAB — BASIC METABOLIC PANEL
BUN: 16
Calcium: 8.5
Creatinine, Ser: 1.01
GFR calc Af Amer: >60
GFR calc Af Amer: >60
GFR calc non Af Amer: >60
GFR calc non Af Amer: >60
Potassium: 3.5
Sodium: 138

## 2011-09-10 LAB — D-DIMER, QUANTITATIVE: D-Dimer, Quant: 0.22

## 2011-09-10 LAB — PROTIME-INR
INR: 1
Prothrombin Time: 13.3

## 2011-09-10 LAB — TSH: TSH: 1.904

## 2011-09-10 LAB — AMYLASE: Amylase: 46

## 2011-09-24 DIAGNOSIS — I428 Other cardiomyopathies: Secondary | ICD-10-CM | POA: Insufficient documentation

## 2011-10-07 ENCOUNTER — Encounter: Payer: Self-pay | Admitting: Cardiology

## 2011-10-07 ENCOUNTER — Ambulatory Visit (INDEPENDENT_AMBULATORY_CARE_PROVIDER_SITE_OTHER): Payer: BC Managed Care – PPO | Admitting: Cardiology

## 2011-10-07 VITALS — BP 154/90 | HR 64 | Resp 16 | Ht 73.0 in | Wt 210.0 lb

## 2011-10-07 DIAGNOSIS — J4489 Other specified chronic obstructive pulmonary disease: Secondary | ICD-10-CM

## 2011-10-07 DIAGNOSIS — I5022 Chronic systolic (congestive) heart failure: Secondary | ICD-10-CM | POA: Insufficient documentation

## 2011-10-07 DIAGNOSIS — I10 Essential (primary) hypertension: Secondary | ICD-10-CM

## 2011-10-07 DIAGNOSIS — R0989 Other specified symptoms and signs involving the circulatory and respiratory systems: Secondary | ICD-10-CM

## 2011-10-07 DIAGNOSIS — R0600 Dyspnea, unspecified: Secondary | ICD-10-CM

## 2011-10-07 DIAGNOSIS — J449 Chronic obstructive pulmonary disease, unspecified: Secondary | ICD-10-CM

## 2011-10-07 DIAGNOSIS — R0609 Other forms of dyspnea: Secondary | ICD-10-CM

## 2011-10-07 DIAGNOSIS — I252 Old myocardial infarction: Secondary | ICD-10-CM

## 2011-10-07 NOTE — Assessment & Plan Note (Signed)
He has concerning symptoms. At this point I will plan an echocardiogram to further evaluate his ejection fraction. I will repeat a stress perfusion study understanding that he has had an inferior wall defect and reduced ejection fraction. Further evaluation will be based on these results.

## 2011-10-07 NOTE — Progress Notes (Signed)
HPI The patient presents for evaluation of dyspnea. He reports a past history of a silent myocardial infarction. I was able to review an echocardiogram from 2008 demonstrating an EF of 40-45% with inferior/posterior hypokinesis.  I retrieved a cardiac catheterization from 2000 demonstrating normal coronary arteries but he did have a mildly reduced ejection fraction with wall motion abnormality. He's had stress perfusion studies at another practice. The last of these was in 2010. There was evidence for an EF of about 35%. There was an inferior wall perfusion defect consistent with diaphragmatic attenuation.   He has been having some increasing dyspnea and has been treated for some chronic lung disease. However, he feels this might be related to his heart in stead. He will get short of breath routinely in his job at [employer] when he is pushing or pulling. He has to stop and it takes a couple of minutes for the breathing to improve. He denies any chest pressure, neck or arm discomfort. He does occasionally have some rapid heartbeat with or without this dyspnea. He thinks it is slowly progressive. He thinks it improves when he takes aspirin. He's not describing any new PND or orthopnea.   Allergies  Allergen Reactions  . Atorvastatin     REACTION: cough    Current Outpatient Prescriptions  Medication Sig Dispense Refill  . ADVAIR DISKUS 250-50 MCG/DOSE AEPB INHALE 1 PUFF BY MOUTH TWICE A DAY  1 each  6  . aspirin 81 MG tablet Take 81 mg by mouth daily.        . citalopram (CELEXA) 20 MG tablet Take 20 mg by mouth daily.        . finasteride (PROSCAR) 5 MG tablet Take 5 mg by mouth daily.        . fluticasone (FLONASE) 50 MCG/ACT nasal spray Place 1 spray into the nose daily.        Marland Kitchen lisinopril (PRINIVIL,ZESTRIL) 40 MG tablet Take 40 mg by mouth daily.        Marland Kitchen loratadine (CLARITIN) 10 MG tablet Take 10 mg by mouth daily.        . metoprolol succinate (TOPROL-XL) 25 MG 24 hr tablet Take 50 mg by  mouth daily.       . Multiple Vitamin (MULTIVITAMIN) capsule Take 1 capsule by mouth daily.        . Omega-3 Fatty Acids (FISH OIL) 1200 MG CAPS Take 1 capsule by mouth 2 (two) times daily.        . pantoprazole (PROTONIX) 40 MG tablet Take 40 mg by mouth daily.        . psyllium (METAMUCIL) 58.6 % powder Take 1 packet by mouth daily.        . rosuvastatin (CRESTOR) 5 MG tablet Take 5 mg by mouth daily.        . Tamsulosin HCl (FLOMAX) 0.4 MG CAPS Take 0.4 mg by mouth daily.        Marland Kitchen VITAMIN D, CHOLECALCIFEROL, PO Take by mouth daily.          Past Medical History  Diagnosis Date  . Old myocardial infarction     EF 40% iwth inferoposterior hypokinesis echo 2008  . Unspecified essential hypertension   . Other diseases of lung, not elsewhere classified   . COPD (chronic obstructive pulmonary disease)   . Hyperlipidemia     Past Surgical History  Procedure Date  . Colon surgery     Family History  Problem Relation Age of Onset  .  Emphysema Mother   . Heart disease Mother     AVR  . Heart failure Father     Died agwe 19    History   Social History  . Marital Status: Married    Spouse Name: N/A    Number of Children: 2  . Years of Education: N/A   Occupational History  . Retired     Airline pilot  . Works [employer]   Social History Main Topics  . Smoking status: Former Smoker -- 2.5 packs/day for 40 years    Types: Cigarettes    Quit date: 11/24/1983  . Smokeless tobacco: Never Used   Comment: 2 1/2 ppd x 40 years  . Alcohol Use: Not on file  . Drug Use: Not on file  . Sexually Active: Not on file   Other Topics Concern  . Not on file   Social History Narrative  . No narrative on file    ROS:  As stated in the HPI and negative for all other systems.   PHYSICAL EXAM BP 154/90  Pulse 64  Resp 16  Ht 6\' 1"  (1.854 m)  Wt 95.255 kg (210 lb)  BMI 27.71 kg/m2 GENERAL:  Well appearing HEENT:  Pupils equal round and reactive, fundi not visualized, oral mucosa  unremarkable, dentures NECK:  No jugular venous distention, waveform within normal limits, carotid upstroke brisk and symmetric, no bruits, no thyromegaly LYMPHATICS:  No cervical, inguinal adenopathy LUNGS:  Clear to auscultation bilaterally BACK:  No CVA tenderness CHEST:  Unremarkable HEART:  PMI not displaced or sustained,S1 and S2 within normal limits, no S3, no S4, no clicks, no rubs, no murmurs ABD:  Flat, positive bowel sounds normal in frequency in pitch, no bruits, no rebound, no guarding, no midline pulsatile mass, no hepatomegaly, no splenomegaly EXT:  2 plus pulses throughout, no edema, no cyanosis no clubbing SKIN:  No rashes no nodules NEURO:  Cranial nerves II through XII grossly intact, motor grossly intact throughout PSYCH:  Cognitively intact, oriented to person place and time  EKG:  Sinus rhythm, rate 67, axis within normal limits, intervals within normal limits, premature ventricular complexes, no acute ST-T wave changes. 10/07/2011   ASSESSMENT AND PLAN

## 2011-10-07 NOTE — Assessment & Plan Note (Signed)
This is being managed by Dr. Delford Field.

## 2011-10-07 NOTE — Assessment & Plan Note (Signed)
His blood pressure is slightly elevated. I will treat this in the context of managing his reduced ejection fraction after obtaining the above data.

## 2011-10-07 NOTE — Patient Instructions (Signed)
Your physician has requested that you have an echocardiogram. Echocardiography is a painless test that uses sound waves to create images of your heart. It provides your doctor with information about the size and shape of your heart and how well your heart's chambers and valves are working. This procedure takes approximately one hour. There are no restrictions for this procedure.  Your physician has requested that you have a lexiscan myoview. For further information please visit [email or web address] Please follow instruction sheet, as given.  Your physician recommends that you schedule a follow-up appointment after these tests are complete.  Continue current medication as listed

## 2011-10-12 ENCOUNTER — Ambulatory Visit: Payer: BC Managed Care – PPO | Admitting: Cardiovascular Disease

## 2011-10-20 ENCOUNTER — Ambulatory Visit (HOSPITAL_COMMUNITY): Payer: BC Managed Care – PPO | Attending: Cardiology

## 2011-10-20 ENCOUNTER — Ambulatory Visit (HOSPITAL_COMMUNITY): Payer: BC Managed Care – PPO | Attending: Cardiology | Admitting: Radiology

## 2011-10-20 DIAGNOSIS — J4489 Other specified chronic obstructive pulmonary disease: Secondary | ICD-10-CM | POA: Insufficient documentation

## 2011-10-20 DIAGNOSIS — J449 Chronic obstructive pulmonary disease, unspecified: Secondary | ICD-10-CM | POA: Insufficient documentation

## 2011-10-20 DIAGNOSIS — I379 Nonrheumatic pulmonary valve disorder, unspecified: Secondary | ICD-10-CM | POA: Insufficient documentation

## 2011-10-20 DIAGNOSIS — I2510 Atherosclerotic heart disease of native coronary artery without angina pectoris: Secondary | ICD-10-CM

## 2011-10-20 DIAGNOSIS — Z87891 Personal history of nicotine dependence: Secondary | ICD-10-CM | POA: Insufficient documentation

## 2011-10-20 DIAGNOSIS — I079 Rheumatic tricuspid valve disease, unspecified: Secondary | ICD-10-CM | POA: Insufficient documentation

## 2011-10-20 DIAGNOSIS — E785 Hyperlipidemia, unspecified: Secondary | ICD-10-CM | POA: Insufficient documentation

## 2011-10-20 DIAGNOSIS — I080 Rheumatic disorders of both mitral and aortic valves: Secondary | ICD-10-CM | POA: Insufficient documentation

## 2011-10-20 DIAGNOSIS — I10 Essential (primary) hypertension: Secondary | ICD-10-CM | POA: Insufficient documentation

## 2011-10-20 DIAGNOSIS — R002 Palpitations: Secondary | ICD-10-CM | POA: Insufficient documentation

## 2011-10-20 DIAGNOSIS — R0609 Other forms of dyspnea: Secondary | ICD-10-CM | POA: Insufficient documentation

## 2011-10-20 DIAGNOSIS — I252 Old myocardial infarction: Secondary | ICD-10-CM | POA: Insufficient documentation

## 2011-10-20 DIAGNOSIS — R0989 Other specified symptoms and signs involving the circulatory and respiratory systems: Secondary | ICD-10-CM | POA: Insufficient documentation

## 2011-10-20 DIAGNOSIS — Z8249 Family history of ischemic heart disease and other diseases of the circulatory system: Secondary | ICD-10-CM | POA: Insufficient documentation

## 2011-10-20 DIAGNOSIS — R0602 Shortness of breath: Secondary | ICD-10-CM | POA: Insufficient documentation

## 2011-10-20 MED ORDER — TECHNETIUM TC 99M TETROFOSMIN IV KIT
11.0000 | PACK | Freq: Once | INTRAVENOUS | Status: AC | PRN
  Administered 2011-10-20: 11 via INTRAVENOUS

## 2011-10-20 MED ORDER — REGADENOSON 0.4 MG/5ML IV SOLN
0.4000 mg | Freq: Once | INTRAVENOUS | Status: AC
  Administered 2011-10-20: 0.4 mg via INTRAVENOUS

## 2011-10-20 MED ORDER — TECHNETIUM TC 99M TETROFOSMIN IV KIT
33.0000 | PACK | Freq: Once | INTRAVENOUS | Status: AC | PRN
  Administered 2011-10-20: 33 via INTRAVENOUS

## 2011-10-20 NOTE — Progress Notes (Signed)
[facility] SITE 3 NUCLEAR MED [address] [place] [address] [phone]  Cardiology Nuclear Med Study  Corey Tyler is a 70 y.o. male [id number] 1941/07/29   Nuclear Med Background Indication for Stress Test:  Evaluation for Ischemia History:  COPD, '08 Echo: EF 40% inf/post hypokinesis, 2000 Heart Catheterization: NL < EF abn wall motion, 1980s Myocardial Infarction: Silent MI and 2010 Myocardial Perfusion Study: EF 35% inf. Wall defect  Cardiac Risk Factors: Family History - CAD, History of Smoking, Hypertension and Lipids  Symptoms:  DOE, Palpitations and SOB   Nuclear Pre-Procedure Caffeine/Decaff Intake:  None NPO After: 8:30pm   Lungs: clear IV 0.9% NS with Angio Cath:  20g  IV Site: R Antecubital  IV Started by:  Stanton Kidney, EMT-P  Chest Size (in):  42 Cup Size: n/a  Height: 6\' 1"  (1.854 m)  Weight:  204 lb (92.534 kg)  BMI:  Body mass index is 26.91 kg/(m^2). Tech Comments:  Metoprolol held, per patient. This patient has freq PVCS/multifocal, Vcuplets, and vtach. Pictures checked    Nuclear Med Study 1 or 2 day study: 1 day  Stress Test Type:  Treadmill/Lexiscan  Reading MD: Olga Millers, MD  Order Authorizing Provider:  J. Hochrein  Resting Radionuclide: Technetium 50m Tetrofosmin  Resting Radionuclide Dose: 11.0 mCi   Stress Radionuclide:  Technetium 65m Tetrofosmin  Stress Radionuclide Dose: 33.0 mCi           Stress Protocol Rest HR: 75 Stress HR: 122  Rest BP: 113/92 Stress BP: 144/80  Exercise Time (min): n/a METS: n/a   Predicted Max HR: 149 bpm % Max HR: 81.88 bpm Rate Pressure Product: 46962   Dose of Adenosine (mg):  n/a Dose of Lexiscan: 0.4 mg  Dose of Atropine (mg): n/a Dose of Dobutamine: n/a mcg/kg/min (at max HR)  Stress Test Technologist: Milana Na, EMT-P  Nuclear Technologist:  Doyne Keel, CNMT     Rest Procedure:  Myocardial perfusion imaging was performed at rest 45 minutes following the intravenous  administration of Technetium 15m Tetrofosmin. Rest ECG: NSR with non-specific ST-T wave changes  Stress Procedure:  The patient received IV Lexiscan 0.4 mg over 15-seconds with concurrent low level exercise and then Technetium 26m Tetrofosmin was injected at 30-seconds while the patient continued walking one more minute.  There were no significant changes, + sob, and freq pvcs/vcuplets/vtach with Lexiscan.  Quantitative spect images were obtained after a 45-minute delay. Stress ECG: No significant ST segment change suggestive of ischemia; frequent PVCs, couplets; occasional 3 and 4 beat runs NSVT.  QPS Raw Data Images:  Acquisition technically good; Significant LVE. Stress Images:  There is decreased uptake in the inferolateral wall. Rest Images:  There is decreased uptake in the inferolateral wall. Subtraction (SDS):  There is a fixed defect that is most consistent with a previous infarction. Transient Ischemic Dilatation (Normal <1.22):  0.99 Lung/Heart Ratio (Normal <0.45):  0.40  Quantitative Gated Spect Images QGS EDV:  NA QGS ESV:  NA QGS cine images:NA QGS EF: Study not gated  Impression Exercise Capacity:  Lexiscan with no exercise. BP Response:  Normal blood pressure response. Clinical Symptoms:  No chest pain. ECG Impression:  No significant ST segment change suggestive of ischemia. Comparison with Prior Nuclear Study: No images to compare  Overall Impression:  Abnormal stress nuclear study with a large fixed inferolateral defect consistent with previous infarct; no ischemia.  Olga Millers

## 2011-10-21 ENCOUNTER — Telehealth: Payer: Self-pay | Admitting: Cardiology

## 2011-10-21 NOTE — Telephone Encounter (Signed)
FU Call: Pt returning call from our office. Please return pt call to discuss further.  

## 2011-10-21 NOTE — Telephone Encounter (Signed)
Patient was returning a call possible for the stress test results. Patient is aware that Dr. Antoine Poche needs to review the test and make recommendations. Pt states he would like to know the results soon.

## 2011-10-26 NOTE — Telephone Encounter (Signed)
Reviewed results with pt 11/28.  He will be seen on Tuesday in follow up.

## 2011-10-27 ENCOUNTER — Encounter: Payer: Self-pay | Admitting: Nurse Practitioner

## 2011-10-27 ENCOUNTER — Ambulatory Visit (INDEPENDENT_AMBULATORY_CARE_PROVIDER_SITE_OTHER): Payer: BC Managed Care – PPO | Admitting: Nurse Practitioner

## 2011-10-27 DIAGNOSIS — I5020 Unspecified systolic (congestive) heart failure: Secondary | ICD-10-CM

## 2011-10-27 DIAGNOSIS — I255 Ischemic cardiomyopathy: Secondary | ICD-10-CM

## 2011-10-27 DIAGNOSIS — I2589 Other forms of chronic ischemic heart disease: Secondary | ICD-10-CM

## 2011-10-27 DIAGNOSIS — I10 Essential (primary) hypertension: Secondary | ICD-10-CM

## 2011-10-27 LAB — CBC WITH DIFFERENTIAL/PLATELET
Basophils Absolute: 0 10*3/uL (ref 0.0–0.1)
Basophils Relative: 0.4 % (ref 0.0–3.0)
Eosinophils Absolute: 0.2 10*3/uL (ref 0.0–0.7)
Eosinophils Relative: 3 % (ref 0.0–5.0)
HCT: 41.5 % (ref 39.0–52.0)
Hemoglobin: 14.1 g/dL (ref 13.0–17.0)
Lymphocytes Relative: 26.1 % (ref 12.0–46.0)
Lymphs Abs: 1.9 10*3/uL (ref 0.7–4.0)
MCHC: 33.9 g/dL (ref 30.0–36.0)
MCV: 94.4 fl (ref 78.0–100.0)
Monocytes Absolute: 0.6 10*3/uL (ref 0.1–1.0)
Monocytes Relative: 8.8 % (ref 3.0–12.0)
Neutro Abs: 4.4 10*3/uL (ref 1.4–7.7)
Neutrophils Relative %: 61.7 % (ref 43.0–77.0)
Platelets: 171 10*3/uL (ref 150.0–400.0)
RBC: 4.39 Mil/uL (ref 4.22–5.81)
RDW: 13.8 % (ref 11.5–14.6)
WBC: 7.1 10*3/uL (ref 4.5–10.5)

## 2011-10-27 LAB — BASIC METABOLIC PANEL
BUN: 13 mg/dL (ref 6–23)
CO2: 25 mEq/L (ref 19–32)
Calcium: 8.7 mg/dL (ref 8.4–10.5)
Chloride: 107 mEq/L (ref 96–112)
Creatinine, Ser: 1 mg/dL (ref 0.4–1.5)
GFR: 82.92 mL/min (ref >60.00)
Glucose, Bld: 95 mg/dL (ref 70–99)
Potassium: 4.5 mEq/L (ref 3.5–5.1)
Sodium: 139 mEq/L (ref 135–145)

## 2011-10-27 MED ORDER — FUROSEMIDE 40 MG PO TABS: 40.0000 mg | ORAL_TABLET | Freq: Every day | ORAL | Status: DC

## 2011-10-27 NOTE — Progress Notes (Signed)
Corey Tyler Date of Birth: 1941/09/04 Medical Record #[id number]  History of Present Illness: Corey Tyler is seen today for a follow up visit. He is subsequently seen with Dr. Antoine Poche. He has had a prior silent MI. EF has dropped from 35% to 20 to 25%. No chest pain. He remains short of breath. Has PND that is waking him up in the early hours of the morning. Some abdominal bloating. No real swelling. Not lightheaded or dizzy. Blood pressure at home stays in the 150's. He was told to take a Norvasc if it is above 155. He has not had to take that but does have elevated blood pressure in general. He admits to using lots of salt. No palpitations. No syncope.   Current Outpatient Prescriptions on File Prior to Visit  Medication Sig Dispense Refill  . ADVAIR DISKUS 250-50 MCG/DOSE AEPB INHALE 1 PUFF BY MOUTH TWICE A DAY  1 each  6  . aspirin 81 MG tablet Take 81 mg by mouth daily.        . fluticasone (FLONASE) 50 MCG/ACT nasal spray Place 1 spray into the nose as needed.       Marland Kitchen lisinopril (PRINIVIL,ZESTRIL) 40 MG tablet Take 40 mg by mouth daily.       Marland Kitchen loratadine (CLARITIN) 10 MG tablet Take 10 mg by mouth daily.        . metoprolol succinate (TOPROL-XL) 25 MG 24 hr tablet Take 50 mg by mouth daily.       . Multiple Vitamin (MULTIVITAMIN) capsule Take 1 capsule by mouth daily.        . Omega-3 Fatty Acids (FISH OIL) 1200 MG CAPS Take 1 capsule by mouth 2 (two) times daily.        . pantoprazole (PROTONIX) 40 MG tablet Take 40 mg by mouth daily.        . psyllium (METAMUCIL) 58.6 % powder Take 1 packet by mouth daily.       . rosuvastatin (CRESTOR) 5 MG tablet Take 5 mg by mouth daily.        . Tamsulosin HCl (FLOMAX) 0.4 MG CAPS Take 0.4 mg by mouth daily.        Marland Kitchen VITAMIN D, CHOLECALCIFEROL, PO Take by mouth daily.          Allergies  Allergen Reactions  . Atorvastatin     REACTION: cough    Past Medical History  Diagnosis Date  . Old myocardial infarction     Silent MI; cath in  2000 showing normal coronaries with mildly reduced EF with wall motion abnormality  . Unspecified essential hypertension   . COPD (chronic obstructive pulmonary disease)   . Hyperlipidemia   . Systolic heart failure     EF down to 20 to 25% per echo November 2012  . Dyspnea   . Abnormal nuclear cardiac imaging test November 2012    Large fixed inferolateral defect consistent with prior infarct; No ischemia;     Past Surgical History  Procedure Date  . Colon surgery   . Cardiac catheterization 2000    History  Smoking status  . Former Smoker -- 2.5 packs/day for 40 years  . Types: Cigarettes  . Quit date: 11/24/1983  Smokeless tobacco  . Never Used  Comment: 2 1/2 ppd x 40 years    History  Alcohol Use No    Family History  Problem Relation Age of Onset  . Emphysema Mother   . Heart disease Mother  AVR  . Heart failure Father     Died agwe 4    Review of Systems: The review of systems is positive for DOE with PND.  All other systems were reviewed and are negative.  Physical Exam: BP 148/90  Pulse 66  Ht 6\' 1"  (1.854 m)  Wt 211 lb (95.709 kg)  BMI 27.84 kg/m2 Patient is very pleasant and in no acute distress. He is subsequently seen with Dr. Antoine Poche. Skin is warm and dry. Color is normal.  HEENT is unremarkable. Normocephalic/atraumatic. PERRL. Sclera are nonicteric. Neck is supple. No masses. No JVD. Lungs are clear. Cardiac exam shows a regular rate and rhythm. Abdomen is soft. Extremities are with trace edema. Gait and ROM are intact. No gross neurologic deficits noted.   LABORATORY DATA: ECHO now with EF down to 20 to 25%. Nuclear study with large inferolateral scar and no ischemia.    Assessment / Plan:

## 2011-10-27 NOTE — Assessment & Plan Note (Signed)
Has had remote silent MI. No ischemia on myoview noted. EF has dropped to 20 to 25%. He continues to have dyspnea with PND. We will add Lasix 40 mg daily. He is strongly encouraged to cut back the salt. He is to weigh daily. We will check a BMET and BNP today. I will see him back in a week. We will also go ahead and refer to EP for ICD implant. The patient was subsequently seen with Dr. Antoine Poche. Patient is agreeable to this plan and will call if any problems develop in the interim.

## 2011-10-27 NOTE — Patient Instructions (Addendum)
We are going to check some labs today.  We are going to put you on Lasix 40 mg daily  Weigh every day.  Cut back the salt. Less than 2000 mg of sodium is recommended.   I will see you in a week.   We will get you an appointment to see the EP doctors here to discuss an ICD.  Call the [facility] office at [phone] if you have any questions, problems or concerns.

## 2011-10-27 NOTE — Assessment & Plan Note (Signed)
Blood pressure has been running high. Lasix has been added. Will try to get his symptoms better and then try to increase the Toprol on return. He is already on 40 mg of Lisinopril. He could be a candidate for aldactone as well. Will readdress on return visit.

## 2011-10-28 LAB — BRAIN NATRIURETIC PEPTIDE: Pro B Natriuretic peptide (BNP): 278 pg/mL — ABNORMAL HIGH (ref 0.0–100.0)

## 2011-10-29 ENCOUNTER — Telehealth: Payer: Self-pay | Admitting: Cardiology

## 2011-10-29 ENCOUNTER — Other Ambulatory Visit (INDEPENDENT_AMBULATORY_CARE_PROVIDER_SITE_OTHER): Payer: BC Managed Care – PPO | Admitting: *Deleted

## 2011-10-29 ENCOUNTER — Other Ambulatory Visit: Payer: Self-pay | Admitting: Nurse Practitioner

## 2011-10-29 DIAGNOSIS — I5020 Unspecified systolic (congestive) heart failure: Secondary | ICD-10-CM

## 2011-10-29 LAB — BASIC METABOLIC PANEL
BUN: 16 mg/dL (ref 6–23)
CO2: 28 mEq/L (ref 19–32)
Calcium: 9.3 mg/dL (ref 8.4–10.5)
Chloride: 103 mEq/L (ref 96–112)
Creatinine, Ser: 1.2 mg/dL (ref 0.4–1.5)
GFR: 61.55 mL/min (ref >60.00)
Glucose, Bld: 90 mg/dL (ref 70–99)
Potassium: 4.1 mEq/L (ref 3.5–5.1)
Sodium: 139 mEq/L (ref 135–145)

## 2011-10-29 NOTE — Telephone Encounter (Signed)
I have spoken with [name]. He is going to come around lunchtime and get a BMET. I have ordered it. Lamar Laundry can draw. Can you look for the results later today and call him. His cell number is [phone]. He is going to hold his Lasix today. Weight is down to 202 per his scales at home.

## 2011-10-29 NOTE — Telephone Encounter (Signed)
Pt wants to talk to Lawson Fiscal about the way he is feeling this morning to see if he needs more medication. Pt is having bad leg cramps b/p 123/81 he feels nausea and he needs to go to work and he doesn't feel like it.

## 2011-10-29 NOTE — Telephone Encounter (Signed)
Will forward to [place] to see if she can discuss with pt.

## 2011-10-29 NOTE — Telephone Encounter (Signed)
Spoke with pt and reviewed his labs with him.  Pt states he feels some better this afternoon and will call back if he starts feeling poorly again.

## 2011-11-02 ENCOUNTER — Ambulatory Visit: Payer: BC Managed Care – PPO | Admitting: Critical Care Medicine

## 2011-11-04 ENCOUNTER — Ambulatory Visit (INDEPENDENT_AMBULATORY_CARE_PROVIDER_SITE_OTHER): Payer: BC Managed Care – PPO | Admitting: Nurse Practitioner

## 2011-11-04 ENCOUNTER — Encounter: Payer: Self-pay | Admitting: Nurse Practitioner

## 2011-11-04 VITALS — BP 122/70 | HR 68 | Ht 73.0 in | Wt 206.0 lb

## 2011-11-04 DIAGNOSIS — J449 Chronic obstructive pulmonary disease, unspecified: Secondary | ICD-10-CM

## 2011-11-04 DIAGNOSIS — R0609 Other forms of dyspnea: Secondary | ICD-10-CM

## 2011-11-04 DIAGNOSIS — I10 Essential (primary) hypertension: Secondary | ICD-10-CM

## 2011-11-04 DIAGNOSIS — R0600 Dyspnea, unspecified: Secondary | ICD-10-CM

## 2011-11-04 DIAGNOSIS — J4489 Other specified chronic obstructive pulmonary disease: Secondary | ICD-10-CM

## 2011-11-04 DIAGNOSIS — I2589 Other forms of chronic ischemic heart disease: Secondary | ICD-10-CM

## 2011-11-04 DIAGNOSIS — R0989 Other specified symptoms and signs involving the circulatory and respiratory systems: Secondary | ICD-10-CM

## 2011-11-04 DIAGNOSIS — I255 Ischemic cardiomyopathy: Secondary | ICD-10-CM

## 2011-11-04 LAB — BASIC METABOLIC PANEL
BUN: 12 mg/dL (ref 6–23)
CO2: 28 mEq/L (ref 19–32)
Calcium: 8.5 mg/dL (ref 8.4–10.5)
Chloride: 105 mEq/L (ref 96–112)
Creatinine, Ser: 1.1 mg/dL (ref 0.4–1.5)
GFR: 71.51 mL/min (ref >60.00)
Glucose, Bld: 81 mg/dL (ref 70–99)
Potassium: 3.8 mEq/L (ref 3.5–5.1)
Sodium: 140 mEq/L (ref 135–145)

## 2011-11-04 LAB — BRAIN NATRIURETIC PEPTIDE: Pro B Natriuretic peptide (BNP): 79 pg/mL (ref 0.0–100.0)

## 2011-11-04 MED ORDER — ISOSORBIDE MONONITRATE ER 30 MG PO TB24: 30.0000 mg | ORAL_TABLET | Freq: Every day | ORAL | Status: DC

## 2011-11-04 NOTE — Assessment & Plan Note (Signed)
His weight is down. Still having issues with PND in the early morning hours. He is willing to try Imdur 30 mg at bedtime. I cautioned him about headache. We will see him back in 3 weeks on a day that Dr. Antoine Poche is here as well. He has a visit with Dr. Graciela Husbands for ICD discussion in January. He is to continue to limit his salt and weigh daily. Patient is agreeable to this plan and will call if any problems develop in the interim.

## 2011-11-04 NOTE — Progress Notes (Signed)
Corey Tyler Date of Birth: 1941/06/01 Medical Record #[id number]  History of Present Illness: Corey Tyler is seen today for a follow up visit. He is seen for Dr. Antoine Poche. He has had prior silent MI. EF has dropped to 20 to 25%. His myoview was negative for ischemia and his echo showed the drop in his EF. He has been referred to EP for an ICD.   We started him on Lasix one week ago. He called in with some fatigue, cramps and nausea. Repeat labs were checked earlier. Potassium was ok. Dose was held one day. He split dosed his medicine but now is back at 40 mg daily. His breathing may be some better, but he continues to have issues at night/early morning with PND. He continues to wake up at around 4am with PND. He has to get up and can not go back to sleep. No more swelling. Weight is down 5 pounds. He does ok during the day but does fatigue easily.   Current Outpatient Prescriptions on File Prior to Visit  Medication Sig Dispense Refill  . ADVAIR DISKUS 250-50 MCG/DOSE AEPB INHALE 1 PUFF BY MOUTH TWICE A DAY  1 each  6  . aspirin 81 MG tablet Take 81 mg by mouth daily.        . fluticasone (FLONASE) 50 MCG/ACT nasal spray Place 1 spray into the nose as needed.       . furosemide (LASIX) 40 MG tablet Take 1 tablet (40 mg total) by mouth daily.  30 tablet  11  . lisinopril (PRINIVIL,ZESTRIL) 40 MG tablet Take 40 mg by mouth daily.       Marland Kitchen loratadine (CLARITIN) 10 MG tablet Take 10 mg by mouth daily.        . Multiple Vitamin (MULTIVITAMIN) capsule Take 1 capsule by mouth daily.        . Omega-3 Fatty Acids (FISH OIL) 1200 MG CAPS Take 1 capsule by mouth 2 (two) times daily.        . pantoprazole (PROTONIX) 40 MG tablet Take 40 mg by mouth daily.        . psyllium (METAMUCIL) 58.6 % powder Take 1 packet by mouth daily.       . rosuvastatin (CRESTOR) 5 MG tablet Take 5 mg by mouth daily.        . Tamsulosin HCl (FLOMAX) 0.4 MG CAPS Take 0.4 mg by mouth daily.        Marland Kitchen VITAMIN D, CHOLECALCIFEROL,  PO Take by mouth daily.          Allergies  Allergen Reactions  . Atorvastatin     REACTION: cough    Past Medical History  Diagnosis Date  . Old myocardial infarction     Silent MI; cath in 2000 showing normal coronaries with mildly reduced EF with wall motion abnormality  . Unspecified essential hypertension   . COPD (chronic obstructive pulmonary disease)   . Hyperlipidemia   . Systolic heart failure     EF down to 20 to 25% per echo November 2012  . Dyspnea   . Abnormal nuclear cardiac imaging test November 2012    Large fixed inferolateral defect consistent with prior infarct; No ischemia;     Past Surgical History  Procedure Date  . Colon surgery   . Cardiac catheterization 2000    History  Smoking status  . Former Smoker -- 2.5 packs/day for 40 years  . Types: Cigarettes  . Quit date: 11/24/1983  Smokeless  tobacco  . Never Used  Comment: 2 1/2 ppd x 40 years    History  Alcohol Use No    Family History  Problem Relation Age of Onset  . Emphysema Mother   . Heart disease Mother     AVR  . Heart failure Father     Died agwe 62    Review of Systems: The review of systems is per the HPI.  All other systems were reviewed and are negative.  Physical Exam: Ht 6\' 1"  (1.854 m)  Wt 206 lb (93.441 kg)  BMI 27.18 kg/m2 Patient is very pleasant and in no acute distress. Skin is warm and dry. Color is normal.  HEENT is unremarkable. Normocephalic/atraumatic. PERRL. Sclera are nonicteric. Neck is supple. No masses. No JVD. Lungs are clear. Cardiac exam shows a regular rate and rhythm.No S3.  Abdomen is soft. Extremities are without edema. Gait and ROM are intact. No gross neurologic deficits noted.  LABORATORY DATA:    Chemistry      Component Value Date/Time   NA 139 10/29/2011 1211   K 4.1 10/29/2011 1211   CL 103 10/29/2011 1211   CO2 28 10/29/2011 1211   BUN 16 10/29/2011 1211   CREATININE 1.2 10/29/2011 1211      Component Value Date/Time   CALCIUM 9.3  10/29/2011 1211   ALKPHOS 56 04/29/2007 0540   AST 14 04/29/2007 0540   ALT 15 04/29/2007 0540   BILITOT 1.0 04/29/2007 0540      Assessment / Plan:

## 2011-11-04 NOTE — Patient Instructions (Addendum)
We are going to recheck your labs today.  Stay on your current medicines. We are going to add a long acting nitroglycerin at night to help with your breathing.  It is Imdur 30 mg - take at bedtime. It might cause a headache.  Weigh yourself each morning and record. Take extra dose of diuretic for weight gain of 3 pounds in 24 hours.   Limit sodium intake. Goal is to have less than 2000 mg (2gm) of salt per day.  Call the [facility] office at [phone] if you have any questions, problems or concerns.

## 2011-11-04 NOTE — Assessment & Plan Note (Signed)
Blood pressure has improved with the addition of the diuretic. His readings from home are reviewed. He will continue to monitor.

## 2011-11-04 NOTE — Assessment & Plan Note (Signed)
He does remain short of breath, worse at night with PND. He does not feel like this is related to his COPD. We will try the nitrate therapy.

## 2011-11-11 ENCOUNTER — Telehealth: Payer: Self-pay | Admitting: Nurse Practitioner

## 2011-11-11 NOTE — Telephone Encounter (Signed)
New msg Pt wants to talk to you about his appt with Dr Graciela Husbands in January. He thinks he shouldn't wait that long. Please call him back

## 2011-11-11 NOTE — Telephone Encounter (Signed)
Spoke with patient, and he's having sob, and will come in tomorrow to see lori while dr.hocherin is here in the office as well

## 2011-11-12 ENCOUNTER — Ambulatory Visit (INDEPENDENT_AMBULATORY_CARE_PROVIDER_SITE_OTHER): Payer: BC Managed Care – PPO | Admitting: Nurse Practitioner

## 2011-11-12 ENCOUNTER — Encounter: Payer: Self-pay | Admitting: Nurse Practitioner

## 2011-11-12 VITALS — BP 110/80 | HR 111 | Ht 73.0 in | Wt 211.0 lb

## 2011-11-12 DIAGNOSIS — I255 Ischemic cardiomyopathy: Secondary | ICD-10-CM

## 2011-11-12 DIAGNOSIS — I2589 Other forms of chronic ischemic heart disease: Secondary | ICD-10-CM

## 2011-11-12 DIAGNOSIS — I10 Essential (primary) hypertension: Secondary | ICD-10-CM

## 2011-11-12 DIAGNOSIS — J449 Chronic obstructive pulmonary disease, unspecified: Secondary | ICD-10-CM

## 2011-11-12 LAB — BASIC METABOLIC PANEL
BUN: 16 mg/dL (ref 6–23)
CO2: 28 mEq/L (ref 19–32)
Calcium: 8.8 mg/dL (ref 8.4–10.5)
Chloride: 104 mEq/L (ref 96–112)
Creatinine, Ser: 1.1 mg/dL (ref 0.4–1.5)
GFR: 73.87 mL/min (ref >60.00)
Glucose, Bld: 93 mg/dL (ref 70–99)
Potassium: 3.9 mEq/L (ref 3.5–5.1)
Sodium: 139 mEq/L (ref 135–145)

## 2011-11-12 LAB — BRAIN NATRIURETIC PEPTIDE: Pro B Natriuretic peptide (BNP): 51 pg/mL (ref 0.0–100.0)

## 2011-11-12 MED ORDER — NITROGLYCERIN 0.4 MG SL SUBL: 0.4000 mg | SUBLINGUAL_TABLET | SUBLINGUAL | Status: DC | PRN

## 2011-11-12 NOTE — Assessment & Plan Note (Signed)
Blood pressure looks good.  

## 2011-11-12 NOTE — Assessment & Plan Note (Signed)
He does have COPD. On Advair. We are rechecking BMET and BNP today.

## 2011-11-12 NOTE — Patient Instructions (Addendum)
Please weigh every morning. Take an extra half of your Lasix for a weight gain of 2 to 3 pounds overnight.  You should take an extra half of your lasix today - along with your normal dose.  Minimize your salt.   I have sent a prescription for sl NTG to the drug store.  Use your NTG under your tongue for recurrent chest pain. May take one tablet every 5 minutes. If you are still having discomfort after 3 tablets in 15 minutes, call 911.  Keep your appointment with Dr. Graciela Husbands next month.  Call the [facility] office at [phone] if you have any questions, problems or concerns.

## 2011-11-12 NOTE — Progress Notes (Signed)
Corey Tyler Date of Birth: May 05, 1941 Medical Record #[id number]  History of Present Illness: Corey Tyler is seen today for a work in visit. He is seen for Dr. Antoine Poche. He has had prior silent MI. EF is now down to 20 to 25%. Myoview was negative for ischemia. Echo showed the drop in his EF. He will be seeing EP next month for ICD implant. He is now on Lasix. Last BNP was down to 79. Imdur was added at night for PND. He is only able to tolerate half dose due to headache. He tried the half dose last night and felt better.   He comes in today. He is here with his wife today. He was working at [employer]. Was walking down the aisles and got more short of breath and a little weak. He felt a sharp, prickly sensation in his chest that was very fleeting in nature. He felt like his abdomen was full. He has noticed that his weight is up at home. He admits to excessive salt. His wife admits to him using excessive salt. He has not been weighing every day. He does have COPD. On Advair. Weight is up today by 5# on our scales. Resting pulse ox is 97%. With ambulating in the office he only drops to 91%. He feels much better today. Has appointment to see Dr. Graciela Husbands next month for ICD.   Current Outpatient Prescriptions on File Prior to Visit  Medication Sig Dispense Refill  . ADVAIR DISKUS 250-50 MCG/DOSE AEPB INHALE 1 PUFF BY MOUTH TWICE A DAY  1 each  6  . aspirin 81 MG tablet Take 81 mg by mouth daily.        . fluticasone (FLONASE) 50 MCG/ACT nasal spray Place 1 spray into the nose as needed.       . furosemide (LASIX) 40 MG tablet Take 1 tablet (40 mg total) by mouth daily.  30 tablet  11  . lisinopril (PRINIVIL,ZESTRIL) 40 MG tablet Take 40 mg by mouth daily.       Marland Kitchen loratadine (CLARITIN) 10 MG tablet Take 10 mg by mouth daily.        . metoprolol (TOPROL-XL) 50 MG 24 hr tablet Take 50 mg by mouth daily.        . Multiple Vitamin (MULTIVITAMIN) capsule Take 1 capsule by mouth daily.        .  Omega-3 Fatty Acids (FISH OIL) 1200 MG CAPS Take 1 capsule by mouth 2 (two) times daily.        . pantoprazole (PROTONIX) 40 MG tablet Take 40 mg by mouth daily.        . psyllium (METAMUCIL) 58.6 % powder Take 1 packet by mouth daily.       . rosuvastatin (CRESTOR) 5 MG tablet Take 5 mg by mouth daily.        . Tamsulosin HCl (FLOMAX) 0.4 MG CAPS Take 0.4 mg by mouth daily.        Marland Kitchen VITAMIN D, CHOLECALCIFEROL, PO Take by mouth daily.          Allergies  Allergen Reactions  . Atorvastatin     REACTION: cough    Past Medical History  Diagnosis Date  . Old myocardial infarction     Silent MI; cath in 2000 showing normal coronaries with mildly reduced EF with wall motion abnormality  . Unspecified essential hypertension   . COPD (chronic obstructive pulmonary disease)   . Hyperlipidemia   . Systolic heart failure  EF down to 20 to 25% per echo November 2012  . Dyspnea   . Abnormal nuclear cardiac imaging test November 2012    Large fixed inferolateral defect consistent with prior infarct; No ischemia;     Past Surgical History  Procedure Date  . Colon surgery   . Cardiac catheterization 2000    History  Smoking status  . Former Smoker -- 2.5 packs/day for 40 years  . Types: Cigarettes  . Quit date: 11/24/1983  Smokeless tobacco  . Never Used  Comment: 2 1/2 ppd x 40 years    History  Alcohol Use No    Family History  Problem Relation Age of Onset  . Emphysema Mother   . Heart disease Mother     AVR  . Heart failure Father     Died agwe 1    Review of Systems: The review of systems is per the HPI.  All other systems were reviewed and are negative.  Physical Exam: BP 130/82  Pulse 87  Ht 6\' 1"  (1.854 m)  Wt 211 lb (95.709 kg)  BMI 27.84 kg/m2  SpO2 97% Patient is very pleasant and in no acute distress. Skin is warm and dry. Color is normal.  HEENT is unremarkable. Normocephalic/atraumatic. PERRL. Sclera are nonicteric. Neck is supple. No masses. No  JVD. Lungs are coarse. Cardiac exam shows a regular rate and rhythm. Abdomen is soft. Extremities are without edema. Gait and ROM are intact. No gross neurologic deficits noted.  LABORATORY DATA: Pending.    Assessment / Plan:

## 2011-11-12 NOTE — Assessment & Plan Note (Addendum)
His weight is up by our scales as well today. We will repeat his BMET and BNP. Salt restriction is strongly encouraged. I have also stressed to him the importance of daily weights. His wife admits that they both need to make changes. He would like to continue the half dose of Imdur and wants some sl NTG on hand. I have sent a RX to the drugstore for NTG. He is instructed in how to use. He will take an extra half of Lasix today. Try to get his weight back down by 5 pounds. We will see him back at his regular time. Will continue with EP referral. It would be our hope that we could have an Opivol device implanted to further help with his care and assessment of fluid balance. Patient is agreeable to this plan and will call if any problems develop in the interim.

## 2011-11-26 ENCOUNTER — Ambulatory Visit (INDEPENDENT_AMBULATORY_CARE_PROVIDER_SITE_OTHER): Payer: BC Managed Care – PPO | Admitting: Internal Medicine

## 2011-11-26 ENCOUNTER — Encounter: Payer: Self-pay | Admitting: Internal Medicine

## 2011-11-26 DIAGNOSIS — I4949 Other premature depolarization: Secondary | ICD-10-CM | POA: Insufficient documentation

## 2011-11-26 DIAGNOSIS — R002 Palpitations: Secondary | ICD-10-CM

## 2011-11-26 DIAGNOSIS — I509 Heart failure, unspecified: Secondary | ICD-10-CM

## 2011-11-26 MED ORDER — SPIRONOLACTONE 25 MG PO TABS: 12.5000 mg | ORAL_TABLET | Freq: Every day | ORAL | Status: DC

## 2011-11-26 NOTE — Progress Notes (Signed)
CARDIOLOGY CONSULT NOTE  Patient ID: Corey Tyler, MRN: [id number], DOB/AGE: 12-25-1940 71 y.o. Admit date: (Not on file) Date of Consult: 11/26/2011  Primary Physician: [name], MD, MD Primary Cardiologist: [email or web address]  Chief Complaint:  Need for defibrillator   HPI: Mr Corey Tyler is seen for consideration of an dICD  He reports a past history of a silent myocardial infarction. I was able to review an echocardiogram from 2008 demonstrating an EF of 40-45% with inferior/posterior hypokinesis. I retrieved a cardiac catheterization from 2000 demonstrating normal coronary arteries but he did have a mildly reduced ejection fraction with wall motion abnormality. He's had stress perfusion studies at another practice. The last of these was in 2010. There was evidence for an EF of about 35%. There was an inferior wall perfusion defect consistent with diaphragmatic attenuation.  Subsequent Echo demonstrated ejection fraction of 25%. Myoview confirmed this ejection fraction it showed a new dense inferior wall motion abnormality consistent with intercurrent infarction. There was no ischemia  His dyspnea has improved with the introduction of diuretics. Notably he is not on Aldactone.   he also notes he has episodes of dyspnea as well as presyncope there is associated with palpitations. He describes 2 distinct syndromes. The first is intermittent skips. The second is fluttering with tachycardia palpitations. These can last minutes.   Past Medical History  Diagnosis Date  . Old myocardial infarction     Silent MI; cath in 2000 showing normal coronaries with mildly reduced EF with wall motion abnormality  . Unspecified essential hypertension   . COPD (chronic obstructive pulmonary disease)   . Hyperlipidemia   . Systolic heart failure     EF down to 20 to 25% per echo November 2012  . Dyspnea   . Abnormal nuclear cardiac imaging test November 2012    Large fixed inferolateral defect consistent with  prior infarct; No ischemia;   . Pulmonary nodule, right     Last scan in 2010 showing stability; felt to be benign.      Surgical History:  Past Surgical History  Procedure Date  . Colon surgery   . Cardiac catheterization 2000     Home Meds: Prior to Admission medications   Medication Sig Start Date End Date Taking? Authorizing Provider  ADVAIR DISKUS 250-50 MCG/DOSE AEPB INHALE 1 PUFF BY MOUTH TWICE A DAY 03/10/11  Yes Shan Levans, MD  aspirin 81 MG tablet Take 81 mg by mouth daily.     Yes Historical Provider, MD  fluticasone (FLONASE) 50 MCG/ACT nasal spray Place 1 spray into the nose as needed.    Yes Historical Provider, MD  furosemide (LASIX) 40 MG tablet Take 1 tablet (40 mg total) by mouth daily. 10/27/11 10/26/12 Yes Rosalio Macadamia, NP  isosorbide mononitrate (IMDUR) 30 MG 24 hr tablet Take 15 mg by mouth at bedtime.   11/04/11 11/03/12 Yes Rosalio Macadamia, NP  lisinopril (PRINIVIL,ZESTRIL) 40 MG tablet Take 40 mg by mouth daily.    Yes Historical Provider, MD  loratadine (CLARITIN) 10 MG tablet Take 10 mg by mouth daily.     Yes Historical Provider, MD  metoprolol (TOPROL-XL) 50 MG 24 hr tablet Take 50 mg by mouth daily.     Yes Historical Provider, MD  Multiple Vitamin (MULTIVITAMIN) capsule Take 1 capsule by mouth daily.     Yes Historical Provider, MD  nitroGLYCERIN (NITROSTAT) 0.4 MG SL tablet Place 1 tablet (0.4 mg total) under the tongue every 5 (five) minutes as needed for  chest pain. 11/12/11 11/11/12 Yes Rosalio Macadamia, NP  Omega-3 Fatty Acids (FISH OIL) 1200 MG CAPS Take 1 capsule by mouth 2 (two) times daily.     Yes Historical Provider, MD  pantoprazole (PROTONIX) 40 MG tablet Take 40 mg by mouth daily.     Yes Historical Provider, MD  psyllium (METAMUCIL) 58.6 % powder Take 1 packet by mouth daily.    Yes Historical Provider, MD  rosuvastatin (CRESTOR) 5 MG tablet Take 5 mg by mouth daily.     Yes Historical Provider, MD  Tamsulosin HCl (FLOMAX) 0.4 MG CAPS Take  0.4 mg by mouth daily.     Yes Historical Provider, MD  VITAMIN D, CHOLECALCIFEROL, PO Take by mouth daily.     Yes Historical Provider, MD      Allergies:  Allergies  Allergen Reactions  . Atorvastatin     REACTION: cough    History   Social History  . Marital Status: Married    Spouse Name: N/A    Number of Children: 2  . Years of Education: N/A   Occupational History  . Retired     Airline pilot  . Works [employer]   Social History Main Topics  . Smoking status: Former Smoker -- 2.5 packs/day for 40 years    Types: Cigarettes    Quit date: 11/24/1983  . Smokeless tobacco: Never Used   Comment: 2 1/2 ppd x 40 years  . Alcohol Use: No  . Drug Use: No  . Sexually Active: Not on file   Other Topics Concern  . Not on file   Social History Narrative  . No narrative on file     Family History  Problem Relation Age of Onset  . Emphysema Mother   . Heart disease Mother     AVR  . Heart failure Father     Died agwe 30     Review of Systems: General: negative for chills, night sweats or weight changes. He has had some episodes where he gets hot.  Cardiovascular: negative for chest pain, edema, orthopnea, palpitations, paroxysmal nocturnal dyspnea,  Dermatological: negative for rash Respiratory: negative for cough or wheezing Urologic: negative for hematuria Abdominal: negative for nausea, vomiting, diarrhea, bright red blood per rectum, melena, or hematemesis Neurologic: negative for visual changes, syncope, or dizziness All other systems reviewed and are otherwise negative except as noted above.          Physical Exam:  Blood pressure 121/70, pulse 46, height 6\' 1"  (1.854 m), weight 211 lb 6.4 oz (95.89 kg). General: Well developed, well nourished, in no acute distress. Head: Normocephalic, atraumatic, sclera non-icteric, no xanthomas, nares are without discharge. Lymph Nodes:  none Neck: Negative for carotid bruits. JVD not elevated. Lungs: Clear  bilaterally to auscultation without wheezes, rales, or rhonchi. Breathing is unlabored. Heart: RRR with S1 S2. No murmurs, rubs, or gallops appreciated. Abdomen: Soft, non-tender, non-distended with normoactive bowel sounds. No hepatomegaly. No rebound/guarding. No obvious abdominal masses. Msk:  Strength and tone appear normal for age. Extremities: No clubbing or cyanosis. No edema.  Distal pedal pulses are 2+ and equal bilaterally. Skin: Warm and Dry Neuro: Alert and oriented X 3. CN III-XII intact Grossly normal sensory and motor function . Psych:  Responds to questions appropriately with a normal affect.      ECG demonstrates sinus rhythm at 72 with frequent PVCs and a narrow QRS   CBC Lab Results  Component Value Date   WBC 7.1 10/27/2011  HGB 14.1 10/27/2011   HCT 41.5 10/27/2011   MCV 94.4 10/27/2011   PLT 171.0 10/27/2011        Assessment and Plan:    Sherryl Manges

## 2011-11-26 NOTE — Assessment & Plan Note (Signed)
As noted above we need to quantitate his PVC burden to see whether maybe contributing to his cardiomyopathy

## 2011-11-26 NOTE — Assessment & Plan Note (Signed)
He has 2 distinct palpitation syndromes. The first is clearly PVCs. The other is a rapid tachycardia palpitation and represents either atrial fibrillation possibly or nonsustained ventricular tachycardia. MCOT monitoring will help

## 2011-11-26 NOTE — Assessment & Plan Note (Signed)
Mr. Fester has a cardiomyopathy with a prior infarct as suggested by his Myoview scan. He is on good medical therapy. It is appropriate to consider an ICD.  However, given the recent diagnosis of intercurrent MI and his congestive heart failure, I would wonder whether we wouldn't be better served by doing a diagnostic catheterization to make sure there is no revascularizable coronary disease. I suggested this to the patient and he is agreeable. I will defer this decision ultimately to Dr. Antoine Poche  The other issues are whether his PVC burden is sufficient to be contributing to his cardiomyopathy. To that end we will use it and cut monitor (see below) to quantitate his PVC burden.

## 2011-11-26 NOTE — Patient Instructions (Signed)
Your physician has recommended you make the following change in your medication: START aldactone 12.5 mg;  This will be given to you in a 25 mg tablet.  Take one half tablet every day.  Your physician recommends that you schedule a follow-up appointment in: 10-14 days with Dr. Antoine Poche.     Your physician recommends that you return for lab work in: 2 weeks for BMET, Dx CHF; schedule this the same day as your visit with Dr. Antoine Poche.    Your physician has recommended that you wear an event monitor. Event monitors are medical devices that record the heart's electrical activity. Doctors most often [place] these monitors to diagnose arrhythmias. Arrhythmias are problems with the speed or rhythm of the heartbeat. The monitor is a small, portable device. You can wear one while you do your normal daily activities. This is usually used to diagnose what is causing palpitations/syncope (passing out).

## 2011-11-26 NOTE — Assessment & Plan Note (Signed)
His dyspnea is improved with his diuretics. Not withstanding his relatively low BNP, I suspect that some of this is congestive failure.

## 2011-11-27 ENCOUNTER — Encounter (INDEPENDENT_AMBULATORY_CARE_PROVIDER_SITE_OTHER): Payer: BC Managed Care – PPO

## 2011-11-27 DIAGNOSIS — R002 Palpitations: Secondary | ICD-10-CM

## 2011-12-01 ENCOUNTER — Telehealth: Payer: Self-pay | Admitting: Internal Medicine

## 2011-12-01 NOTE — Telephone Encounter (Signed)
I will forward to Dr. Graciela Husbands for recommendations as I am not certain of sprionolactone affecting the eyes in the way the patient reports.

## 2011-12-01 NOTE — Telephone Encounter (Signed)
It is extremely unusual to have an association between Aldactone and conjunctivitis. There are reports apparently in 19/19,000 patients. It is more likely to be a co-occurring event as opposed to be causally related event. I recommend that we hold the drugs right now. It would be reasonable to try Epleronone as an alternative and perhaps this can be started by Dr. Antoine Poche when he sees him in the office next week.

## 2011-12-01 NOTE — Telephone Encounter (Signed)
I spoke with the patient and made him aware of Dr. Odessa Fleming recommendations. He took his last dose of spironolactone yesterday. He felt like his eye was a little better this morning, but is getting a little worse this evening. I have advised that he watch it tomorrow and if his symptoms are persistent of worsening, to see an eye doctor. He does describe this like pink eye.

## 2011-12-01 NOTE — Telephone Encounter (Signed)
Pt is on spironolactone and it is affecting his eye. It makes them red and this morning they where stuck together so he will not be able to take it

## 2011-12-04 ENCOUNTER — Ambulatory Visit: Payer: BC Managed Care – PPO | Admitting: Internal Medicine

## 2011-12-08 ENCOUNTER — Encounter: Payer: Self-pay | Admitting: *Deleted

## 2011-12-08 ENCOUNTER — Encounter: Payer: Self-pay | Admitting: Cardiology

## 2011-12-08 ENCOUNTER — Ambulatory Visit (INDEPENDENT_AMBULATORY_CARE_PROVIDER_SITE_OTHER): Payer: BC Managed Care – PPO | Admitting: Cardiology

## 2011-12-08 DIAGNOSIS — Z01810 Encounter for preprocedural cardiovascular examination: Secondary | ICD-10-CM

## 2011-12-08 DIAGNOSIS — I2589 Other forms of chronic ischemic heart disease: Secondary | ICD-10-CM

## 2011-12-08 DIAGNOSIS — R002 Palpitations: Secondary | ICD-10-CM

## 2011-12-08 DIAGNOSIS — I255 Ischemic cardiomyopathy: Secondary | ICD-10-CM

## 2011-12-08 DIAGNOSIS — I10 Essential (primary) hypertension: Secondary | ICD-10-CM

## 2011-12-08 NOTE — Progress Notes (Signed)
]  HPI The patient presents for evaluation of dyspnea. He reports a past history of a silent myocardial infarction. Echo from 2008 demonstrating an EF of 40-45% with inferior/posterior hypokinesis.  Cardiac catheterization from 2000 demonstrating normal coronary arteries but he did have a mildly reduced ejection fraction with wall motion abnormality. He's had stress perfusion studies at another practice. The last of these was in 2010. There was evidence for an EF of about 35%.  He has had increasing dyspnea and had an echo with on EF now found to be 20 - 25%.  He has been referred for consideration of an implantable defibrillator. However, given his falling ejection fraction we have suggested a cardiac catheterization before this. He is back today to discuss this.  He saw Dr. Graciela Husbands recently. He was given spironolactone. He's also been given that monitor which he has worn which demonstrates sinus rhythm with premature atrial and ventricular contractions. He has had no presyncope or syncope. He continues to have episodic dizziness in particular with activities. He has also felt increasingly weak again episodically. He relates this to the Aldactone. He's not describing any new chest pressure, neck or arm discomfort. He's not describing any new PND or orthopnea. He has had no weight gain or edema.   Allergies  Allergen Reactions  . Atorvastatin     REACTION: cough    Current Outpatient Prescriptions  Medication Sig Dispense Refill  . ADVAIR DISKUS 250-50 MCG/DOSE AEPB INHALE 1 PUFF BY MOUTH TWICE A DAY  1 each  6  . aspirin 81 MG tablet Take 81 mg by mouth daily.        . cetirizine (ZYRTEC) 10 MG tablet Take 10 mg by mouth daily.      . fluticasone (FLONASE) 50 MCG/ACT nasal spray Place 1 spray into the nose as needed.       . furosemide (LASIX) 40 MG tablet Take 1 tablet (40 mg total) by mouth daily.  30 tablet  11  . isosorbide mononitrate (IMDUR) 30 MG 24 hr tablet Take 15 mg by mouth at  bedtime.        Marland Kitchen lisinopril (PRINIVIL,ZESTRIL) 40 MG tablet Take 40 mg by mouth daily.       . metoprolol tartrate (LOPRESSOR) 25 MG tablet Take 25 mg by mouth 2 (two) times daily.      . Multiple Vitamin (MULTIVITAMIN) capsule Take 1 capsule by mouth daily.        . nitroGLYCERIN (NITROSTAT) 0.4 MG SL tablet Place 1 tablet (0.4 mg total) under the tongue every 5 (five) minutes as needed for chest pain.  25 tablet  3  . Omega-3 Fatty Acids (FISH OIL) 1200 MG CAPS Take 1 capsule by mouth 2 (two) times daily.        . pantoprazole (PROTONIX) 40 MG tablet Take 40 mg by mouth daily.        . psyllium (METAMUCIL) 58.6 % powder Take 1 packet by mouth daily.       . rosuvastatin (CRESTOR) 5 MG tablet Take 5 mg by mouth daily.        Marland Kitchen spironolactone (ALDACTONE) 25 MG tablet Take 0.5 tablets (12.5 mg total) by mouth daily.  30 tablet  3  . Tamsulosin HCl (FLOMAX) 0.4 MG CAPS Take 0.4 mg by mouth daily.        Marland Kitchen VITAMIN D, CHOLECALCIFEROL, PO Take by mouth daily.        Marland Kitchen DISCONTD: isosorbide mononitrate (IMDUR) 30 MG 24 hr  tablet Take 1 tablet (30 mg total) by mouth at bedtime.  30 tablet  11    Past Medical History  Diagnosis Date  . Old myocardial infarction     Silent MI; cath in 2000 showing normal coronaries with mildly reduced EF with wall motion abnormality  . Unspecified essential hypertension   . COPD (chronic obstructive pulmonary disease)   . Hyperlipidemia   . Systolic heart failure     EF down to 20 to 25% per echo November 2012  . Dyspnea   . Abnormal nuclear cardiac imaging test November 2012    Large fixed inferolateral defect consistent with prior infarct; No ischemia;   . Pulmonary nodule, right     Last scan in 2010 showing stability; felt to be benign.    Past Surgical History  Procedure Date  . Colon surgery   . Cardiac catheterization 2000    Family History  Problem Relation Age of Onset  . Emphysema Mother   . Heart disease Mother     AVR  . Heart failure  Father     Died agwe 33    History   Social History  . Marital Status: Married    Spouse Name: N/A    Number of Children: 2  . Years of Education: N/A   Occupational History  . Retired     Airline pilot  . Works [employer]   Social History Main Topics  . Smoking status: Former Smoker -- 2.5 packs/day for 40 years    Types: Cigarettes    Quit date: 11/24/1983  . Smokeless tobacco: Never Used   Comment: 2 1/2 ppd x 40 years  . Alcohol Use: No  . Drug Use: No  . Sexually Active: Not on file   Other Topics Concern  . Not on file   Social History Narrative  . No narrative on file    ROS:  As stated in the HPI and negative for all other systems.   PHYSICAL EXAM BP 116/74  Pulse 80  Resp 18  Ht 6\' 1"  (1.854 m)  Wt 209 lb (94.802 kg)  BMI 27.57 kg/m2 GENERAL:  Well appearing HEENT:  Pupils equal round and reactive, fundi not visualized, oral mucosa unremarkable, dentures NECK:  No jugular venous distention, waveform within normal limits, carotid upstroke brisk and symmetric, no bruits, no thyromegaly LYMPHATICS:  No cervical, inguinal adenopathy LUNGS:  Clear to auscultation bilaterally BACK:  No CVA tenderness CHEST:  Unremarkable HEART:  PMI not displaced or sustained,S1 and S2 within normal limits, no S3, no S4, no clicks, no rubs, no murmurs ABD:  Flat, positive bowel sounds normal in frequency in pitch, no bruits, no rebound, no guarding, no midline pulsatile mass, no hepatomegaly, no splenomegaly EXT:  2 plus pulses throughout, no edema, no cyanosis no clubbing SKIN:  No rashes no nodules NEURO:  Cranial nerves II through XII grossly intact, motor grossly intact throughout PSYCH:  Cognitively intact, oriented to person place and time   ASSESSMENT AND PLAN

## 2011-12-08 NOTE — Assessment & Plan Note (Signed)
His blood pressure has been well controlled. No change in therapy is indicated.

## 2011-12-08 NOTE — Assessment & Plan Note (Signed)
Given his falling ejection fraction and symptoms cardiac catheterization is indicated. I will plan a right and left heart catheterization as an outpatient. Further evaluation will be based on this. Of note since he's feeling poorly and ascribes it to the aldactone, though unlikely, I will stop this at this point.

## 2011-12-08 NOTE — Patient Instructions (Signed)
Your physician recommends that you return for lab work in: today  Your physician has requested that you have a cardiac catheterization. Cardiac catheterization is used to diagnose and/or treat various heart conditions. Doctors may recommend this procedure for a number of different reasons. The most common reason is to evaluate chest pain. Chest pain can be a symptom of coronary artery disease (CAD), and cardiac catheterization can show whether plaque is narrowing or blocking your heart's arteries. This procedure is also used to evaluate the valves, as well as measure the blood flow and oxygen levels in different parts of your heart. For further information please visit [email or web address] Please follow instruction sheet, as given.

## 2011-12-08 NOTE — Assessment & Plan Note (Signed)
He is not describing new tachycardia palpitations. I have reviewed his event monitor and there are no sustained dysrhythmias. No change in therapy is indicated.

## 2011-12-09 LAB — CBC WITH DIFFERENTIAL/PLATELET
Basophils Relative: 0.3 % (ref 0.0–3.0)
Eosinophils Absolute: 0.3 10*3/uL (ref 0.0–0.7)
Eosinophils Relative: 3.8 % (ref 0.0–5.0)
Hemoglobin: 14.3 g/dL (ref 13.0–17.0)
Lymphocytes Relative: 26.8 % (ref 12.0–46.0)
Monocytes Relative: 7.9 % (ref 3.0–12.0)
Neutro Abs: 5 10*3/uL (ref 1.4–7.7)
Neutrophils Relative %: 61.2 % (ref 43.0–77.0)
RBC: 4.44 Mil/uL (ref 4.22–5.81)
WBC: 8.2 10*3/uL (ref 4.5–10.5)

## 2011-12-09 LAB — BASIC METABOLIC PANEL
CO2: 27 mEq/L (ref 19–32)
Calcium: 8.7 mg/dL (ref 8.4–10.5)
Creatinine, Ser: 1.5 mg/dL (ref 0.4–1.5)
GFR: 49.7 mL/min — ABNORMAL LOW (ref >60.00)
Sodium: 138 mEq/L (ref 135–145)

## 2011-12-09 LAB — PROTIME-INR: INR: 1 ratio (ref 0.8–1.0)

## 2011-12-10 ENCOUNTER — Other Ambulatory Visit: Payer: BC Managed Care – PPO | Admitting: *Deleted

## 2011-12-13 ENCOUNTER — Other Ambulatory Visit: Payer: Self-pay | Admitting: Cardiology

## 2011-12-18 ENCOUNTER — Inpatient Hospital Stay (HOSPITAL_BASED_OUTPATIENT_CLINIC_OR_DEPARTMENT_OTHER)
Admission: RE | Admit: 2011-12-18 | Discharge: 2011-12-18 | Disposition: A | Discharge status: Home / Self Care | Payer: BC Managed Care – PPO | Source: Ambulatory Visit | Attending: Cardiology | Admitting: Cardiology

## 2011-12-18 ENCOUNTER — Encounter (HOSPITAL_BASED_OUTPATIENT_CLINIC_OR_DEPARTMENT_OTHER)
Admission: RE | Disposition: A | Discharge status: Home / Self Care | Payer: Self-pay | Source: Ambulatory Visit | Attending: Cardiology

## 2011-12-18 DIAGNOSIS — J449 Chronic obstructive pulmonary disease, unspecified: Secondary | ICD-10-CM | POA: Insufficient documentation

## 2011-12-18 DIAGNOSIS — J4489 Other specified chronic obstructive pulmonary disease: Secondary | ICD-10-CM | POA: Insufficient documentation

## 2011-12-18 DIAGNOSIS — I252 Old myocardial infarction: Secondary | ICD-10-CM | POA: Insufficient documentation

## 2011-12-18 DIAGNOSIS — I2510 Atherosclerotic heart disease of native coronary artery without angina pectoris: Secondary | ICD-10-CM | POA: Insufficient documentation

## 2011-12-18 DIAGNOSIS — I429 Cardiomyopathy, unspecified: Secondary | ICD-10-CM

## 2011-12-18 DIAGNOSIS — I10 Essential (primary) hypertension: Secondary | ICD-10-CM | POA: Insufficient documentation

## 2011-12-18 DIAGNOSIS — I428 Other cardiomyopathies: Secondary | ICD-10-CM | POA: Insufficient documentation

## 2011-12-18 SURGERY — JV LEFT AND RIGHT HEART CATHETERIZATION WITH CORONARY ANGIOGRAM
Anesthesia: Moderate Sedation

## 2011-12-18 MED ORDER — ACETAMINOPHEN 325 MG PO TABS: 650.0000 mg | ORAL_TABLET | ORAL | Status: DC | PRN

## 2011-12-18 MED ORDER — SODIUM CHLORIDE 0.9 % IV SOLN: INTRAVENOUS | Status: DC

## 2011-12-18 MED ORDER — ONDANSETRON HCL 4 MG/2ML IJ SOLN: 4.0000 mg | Freq: Four times a day (QID) | INTRAMUSCULAR | Status: DC | PRN

## 2011-12-18 MED ORDER — SODIUM CHLORIDE 0.9 % IJ SOLN: 3.0000 mL | Freq: Two times a day (BID) | INTRAMUSCULAR | Status: DC

## 2011-12-18 MED ORDER — SODIUM CHLORIDE 0.9 % IV SOLN
INTRAVENOUS | Status: DC
  Administered 2011-12-18: 09:00:00 via INTRAVENOUS

## 2011-12-18 MED ORDER — SODIUM CHLORIDE 0.9 % IJ SOLN: 3.0000 mL | INTRAMUSCULAR | Status: DC | PRN

## 2011-12-18 MED ORDER — ASPIRIN 81 MG PO CHEW
324.0000 mg | CHEWABLE_TABLET | ORAL | Status: AC
  Administered 2011-12-18: 324 mg via ORAL

## 2011-12-18 MED ORDER — SODIUM CHLORIDE 0.9 % IV SOLN: 250.0000 mL | INTRAVENOUS | Status: DC | PRN

## 2011-12-18 NOTE — Procedures (Signed)
  Cardiac Catheterization Procedure Note  Name: Corey Tyler MRN: [id number] DOB: February 26, 1941  Procedure: Right Heart Cath, Left Heart Cath, Selective Coronary Angiography, LV angiography  Indication:   Cardiomyopathy with evidence of inferior infarct.  He is being considered for an ICD  Procedural Details: The right groin was prepped, draped, and anesthetized with 1% lidocaine. Using the modified Seldinger technique a 5 French sheath was placed in the right femoral artery and a 7 French sheath was placed in the right femoral vein. A Swan-Ganz catheter was used for the right heart catheterization. Standard protocol was followed for recording of right heart pressures and sampling of oxygen saturations. Fick cardiac output was calculated. Standard Judkins catheters were used for selective coronary angiography and left ventriculography. There were no immediate procedural complications. The patient was transferred to the post catheterization recovery area for further monitoring.  Procedural Findings: Hemodynamics:               RA 4    RV 31/6    PA 34/7 mean 21    PCWP 6    LV 123/12    AO 125/90   Oxygen saturations:    PA 57    AO 97   Cardiac Output (Fick) 3.9                               Cardiac Index (Fick) 1.8  Coronary angiography: Coronary dominance: right  Left mainstem: NL  Left anterior descending (LAD): Mild luminal irregularities.  Proximal mild calcification. Proximal to mid long 25% stenosis. First diagonal tiny with 30% stenosis. Second diagonal small to moderate size with luminal irregularities.   Left circumflex (LCx): Proximal AV groove normal. Large mid obtuse marginal normal.  Right coronary artery (RCA): Very large.  Normal.  PDA moderate sized and normal  Left ventriculography:   LV was not injected because of mild/moderate renal insufficiency.  He is known to have global LV dysfunction with inferior wall akinesis and EF of 20 - 25%.  Final Conclusions:   Minimal CAD.  Normal right heart pressures.  Recommendations:  ICD per Dr. Graciela Husbands.  Continue medical management of LV dysfunction.   [name] 12/18/2011, 10:29 AM

## 2011-12-18 NOTE — H&P (View-Only) (Signed)
]  HPI The patient presents for evaluation of dyspnea. He reports a past history of a silent myocardial infarction. Echo from 2008 demonstrating an EF of 40-45% with inferior/posterior hypokinesis.  Cardiac catheterization from 2000 demonstrating normal coronary arteries but he did have a mildly reduced ejection fraction with wall motion abnormality. He's had stress perfusion studies at another practice. The last of these was in 2010. There was evidence for an EF of about 35%.  He has had increasing dyspnea and had an echo with on EF now found to be 20 - 25%.  He has been referred for consideration of an implantable defibrillator. However, given his falling ejection fraction we have suggested a cardiac catheterization before this. He is back today to discuss this.  He saw Dr. Klein recently. He was given spironolactone. He's also been given that monitor which he has worn which demonstrates sinus rhythm with premature atrial and ventricular contractions. He has had no presyncope or syncope. He continues to have episodic dizziness in particular with activities. He has also felt increasingly weak again episodically. He relates this to the Aldactone. He's not describing any new chest pressure, neck or arm discomfort. He's not describing any new PND or orthopnea. He has had no weight gain or edema.   Allergies  Allergen Reactions  . Atorvastatin     REACTION: cough    Current Outpatient Prescriptions  Medication Sig Dispense Refill  . ADVAIR DISKUS 250-50 MCG/DOSE AEPB INHALE 1 PUFF BY MOUTH TWICE A DAY  1 each  6  . aspirin 81 MG tablet Take 81 mg by mouth daily.        . cetirizine (ZYRTEC) 10 MG tablet Take 10 mg by mouth daily.      . fluticasone (FLONASE) 50 MCG/ACT nasal spray Place 1 spray into the nose as needed.       . furosemide (LASIX) 40 MG tablet Take 1 tablet (40 mg total) by mouth daily.  30 tablet  11  . isosorbide mononitrate (IMDUR) 30 MG 24 hr tablet Take 15 mg by mouth at  bedtime.        . lisinopril (PRINIVIL,ZESTRIL) 40 MG tablet Take 40 mg by mouth daily.       . metoprolol tartrate (LOPRESSOR) 25 MG tablet Take 25 mg by mouth 2 (two) times daily.      . Multiple Vitamin (MULTIVITAMIN) capsule Take 1 capsule by mouth daily.        . nitroGLYCERIN (NITROSTAT) 0.4 MG SL tablet Place 1 tablet (0.4 mg total) under the tongue every 5 (five) minutes as needed for chest pain.  25 tablet  3  . Omega-3 Fatty Acids (FISH OIL) 1200 MG CAPS Take 1 capsule by mouth 2 (two) times daily.        . pantoprazole (PROTONIX) 40 MG tablet Take 40 mg by mouth daily.        . psyllium (METAMUCIL) 58.6 % powder Take 1 packet by mouth daily.       . rosuvastatin (CRESTOR) 5 MG tablet Take 5 mg by mouth daily.        . spironolactone (ALDACTONE) 25 MG tablet Take 0.5 tablets (12.5 mg total) by mouth daily.  30 tablet  3  . Tamsulosin HCl (FLOMAX) 0.4 MG CAPS Take 0.4 mg by mouth daily.        . VITAMIN D, CHOLECALCIFEROL, PO Take by mouth daily.        . DISCONTD: isosorbide mononitrate (IMDUR) 30 MG 24 hr   tablet Take 1 tablet (30 mg total) by mouth at bedtime.  30 tablet  11    Past Medical History  Diagnosis Date  . Old myocardial infarction     Silent MI; cath in 2000 showing normal coronaries with mildly reduced EF with wall motion abnormality  . Unspecified essential hypertension   . COPD (chronic obstructive pulmonary disease)   . Hyperlipidemia   . Systolic heart failure     EF down to 20 to 25% per echo November 2012  . Dyspnea   . Abnormal nuclear cardiac imaging test November 2012    Large fixed inferolateral defect consistent with prior infarct; No ischemia;   . Pulmonary nodule, right     Last scan in 2010 showing stability; felt to be benign.    Past Surgical History  Procedure Date  . Colon surgery   . Cardiac catheterization 2000    Family History  Problem Relation Age of Onset  . Emphysema Mother   . Heart disease Mother     AVR  . Heart failure  Father     Died agwe 86    History   Social History  . Marital Status: Married    Spouse Name: N/A    Number of Children: 2  . Years of Education: N/A   Occupational History  . Retired     Sales  . Works [employer]   Social History Main Topics  . Smoking status: Former Smoker -- 2.5 packs/day for 40 years    Types: Cigarettes    Quit date: 11/24/1983  . Smokeless tobacco: Never Used   Comment: 2 1/2 ppd x 40 years  . Alcohol Use: No  . Drug Use: No  . Sexually Active: Not on file   Other Topics Concern  . Not on file   Social History Narrative  . No narrative on file    ROS:  As stated in the HPI and negative for all other systems.   PHYSICAL EXAM BP 116/74  Pulse 80  Resp 18  Ht 6' 1" (1.854 m)  Wt 209 lb (94.802 kg)  BMI 27.57 kg/m2 GENERAL:  Well appearing HEENT:  Pupils equal round and reactive, fundi not visualized, oral mucosa unremarkable, dentures NECK:  No jugular venous distention, waveform within normal limits, carotid upstroke brisk and symmetric, no bruits, no thyromegaly LYMPHATICS:  No cervical, inguinal adenopathy LUNGS:  Clear to auscultation bilaterally BACK:  No CVA tenderness CHEST:  Unremarkable HEART:  PMI not displaced or sustained,S1 and S2 within normal limits, no S3, no S4, no clicks, no rubs, no murmurs ABD:  Flat, positive bowel sounds normal in frequency in pitch, no bruits, no rebound, no guarding, no midline pulsatile mass, no hepatomegaly, no splenomegaly EXT:  2 plus pulses throughout, no edema, no cyanosis no clubbing SKIN:  No rashes no nodules NEURO:  Cranial nerves II through XII grossly intact, motor grossly intact throughout PSYCH:  Cognitively intact, oriented to person place and time   ASSESSMENT AND PLAN    

## 2011-12-18 NOTE — Progress Notes (Signed)
Bedrest begins @ 1045. 

## 2011-12-18 NOTE — Interval H&P Note (Signed)
History and Physical Interval Note:  12/18/2011 10:00 AM  Corey Tyler  has presented today for surgery, with the diagnosis of cardiomyopathy  The various methods of treatment have been discussed with the patient and family. After consideration of risks, benefits and other options for treatment, the patient has consented to  Procedure(s): JV LEFT AND RIGHT HEART CATHETERIZATION WITH CORONARY ANGIOGRAM as a surgical intervention .  The patients' history has been reviewed, patient examined, no change in status, stable for surgery.  I have reviewed the patients' chart and labs.  Questions were answered to the patient's satisfaction.     [name]

## 2011-12-18 NOTE — Progress Notes (Signed)
Voided 275cc, clear yellow urine

## 2011-12-18 NOTE — Progress Notes (Signed)
Discharge instructions completed, patient voiced verbal understanding.  Ambulated in hall without bleeding from right groin site.  Discharged to home via wheelchair with wife.

## 2011-12-21 LAB — POCT I-STAT 3, ART BLOOD GAS (G3+)
Acid-base deficit: 1 mmol/L (ref 0.0–2.0)
Bicarbonate: 23.4 mEq/L (ref 20.0–24.0)

## 2011-12-21 LAB — POCT I-STAT 3, VENOUS BLOOD GAS (G3P V)
Acid-base deficit: 2 mmol/L (ref 0.0–2.0)
O2 Saturation: 57 %
TCO2: 25 mmol/L (ref 0–100)

## 2011-12-24 ENCOUNTER — Telehealth: Payer: Self-pay | Admitting: Cardiology

## 2011-12-24 DIAGNOSIS — I255 Ischemic cardiomyopathy: Secondary | ICD-10-CM

## 2011-12-24 DIAGNOSIS — I509 Heart failure, unspecified: Secondary | ICD-10-CM

## 2011-12-24 NOTE — Telephone Encounter (Signed)
New problem Pt was calling about getting defibrillator put in. He hasnt heard anything.

## 2011-12-24 NOTE — Telephone Encounter (Signed)
Will forward to [facility] and Dr Graciela Husbands to schedule

## 2011-12-25 HISTORY — PX: OTHER SURGICAL HISTORY: SHX169

## 2011-12-30 NOTE — Telephone Encounter (Signed)
I left a message for the patient to call to set up his ICD.

## 2012-01-04 ENCOUNTER — Encounter: Payer: Self-pay | Admitting: *Deleted

## 2012-01-04 NOTE — Telephone Encounter (Signed)
I spoke with the patient. He initally stated that someone called his wife and told him to be at the hospital on Wednesday 2/13 at 6:30 am for an 8:30 am procedure. I do not see this documented in his chart. I have also called the EP lab and he is not on the schedule. I apologized to the patient and made him aware I am not certain who contacted him as I schedule Dr. Odessa Fleming outpatient procedures. He has been scheduled for 2/13 at 12 pm with Dr. Graciela Husbands. I have discussed his instructions with him and he will come tomorrow for pre-procedure labs and to pick up his written instructions.

## 2012-01-04 NOTE — Telephone Encounter (Signed)
FU Call: Pt returning call to [facility]. Please return pt call.

## 2012-01-05 ENCOUNTER — Other Ambulatory Visit: Payer: Self-pay | Admitting: Internal Medicine

## 2012-01-05 ENCOUNTER — Other Ambulatory Visit (INDEPENDENT_AMBULATORY_CARE_PROVIDER_SITE_OTHER): Payer: BC Managed Care – PPO | Admitting: *Deleted

## 2012-01-05 ENCOUNTER — Encounter (HOSPITAL_COMMUNITY): Payer: Self-pay | Admitting: Pharmacy Technician

## 2012-01-05 DIAGNOSIS — I2589 Other forms of chronic ischemic heart disease: Secondary | ICD-10-CM

## 2012-01-05 DIAGNOSIS — I509 Heart failure, unspecified: Secondary | ICD-10-CM

## 2012-01-05 DIAGNOSIS — I428 Other cardiomyopathies: Secondary | ICD-10-CM

## 2012-01-05 DIAGNOSIS — I255 Ischemic cardiomyopathy: Secondary | ICD-10-CM

## 2012-01-05 LAB — CBC WITH DIFFERENTIAL/PLATELET
Eosinophils Relative: 7 % — ABNORMAL HIGH (ref 0.0–5.0)
Lymphocytes Relative: 25.5 % (ref 12.0–46.0)
MCV: 93.5 fl (ref 78.0–100.0)
Monocytes Absolute: 0.6 10*3/uL (ref 0.1–1.0)
Neutrophils Relative %: 57.6 % (ref 43.0–77.0)
Platelets: 209 10*3/uL (ref 150.0–400.0)
WBC: 6.3 10*3/uL (ref 4.5–10.5)

## 2012-01-05 LAB — PROTIME-INR
INR: 1 ratio (ref 0.8–1.0)
Prothrombin Time: 11.2 s (ref 10.2–12.4)

## 2012-01-05 LAB — BASIC METABOLIC PANEL
GFR: 63.29 mL/min (ref >60.00)
Glucose, Bld: 96 mg/dL (ref 70–99)
Potassium: 4.1 mEq/L (ref 3.5–5.1)
Sodium: 136 mEq/L (ref 135–145)

## 2012-01-05 MED ORDER — CEFAZOLIN SODIUM-DEXTROSE 2-3 GM-% IV SOLR
2.0000 g | INTRAVENOUS | Status: DC
  Filled 2012-01-05: qty 50

## 2012-01-05 MED ORDER — GENTAMICIN SULFATE 40 MG/ML IJ SOLN
80.0000 mg | INTRAMUSCULAR | Status: DC
  Filled 2012-01-05: qty 2

## 2012-01-06 ENCOUNTER — Ambulatory Visit (HOSPITAL_COMMUNITY)
Admission: RE | Admit: 2012-01-06 | Discharge: 2012-01-07 | Disposition: A | Discharge status: Home / Self Care | Payer: BC Managed Care – PPO | Source: Ambulatory Visit | Attending: Internal Medicine | Admitting: Internal Medicine

## 2012-01-06 ENCOUNTER — Other Ambulatory Visit: Payer: Self-pay

## 2012-01-06 ENCOUNTER — Encounter (HOSPITAL_COMMUNITY)
Admission: RE | Disposition: A | Discharge status: Home / Self Care | Payer: Self-pay | Source: Ambulatory Visit | Attending: Internal Medicine

## 2012-01-06 ENCOUNTER — Ambulatory Visit (HOSPITAL_COMMUNITY): Payer: BC Managed Care – PPO

## 2012-01-06 DIAGNOSIS — I10 Essential (primary) hypertension: Secondary | ICD-10-CM | POA: Insufficient documentation

## 2012-01-06 DIAGNOSIS — J449 Chronic obstructive pulmonary disease, unspecified: Secondary | ICD-10-CM | POA: Insufficient documentation

## 2012-01-06 DIAGNOSIS — I252 Old myocardial infarction: Secondary | ICD-10-CM | POA: Insufficient documentation

## 2012-01-06 DIAGNOSIS — I428 Other cardiomyopathies: Secondary | ICD-10-CM | POA: Insufficient documentation

## 2012-01-06 DIAGNOSIS — R0609 Other forms of dyspnea: Secondary | ICD-10-CM | POA: Insufficient documentation

## 2012-01-06 DIAGNOSIS — J4489 Other specified chronic obstructive pulmonary disease: Secondary | ICD-10-CM | POA: Insufficient documentation

## 2012-01-06 DIAGNOSIS — R0989 Other specified symptoms and signs involving the circulatory and respiratory systems: Secondary | ICD-10-CM | POA: Insufficient documentation

## 2012-01-06 DIAGNOSIS — R9439 Abnormal result of other cardiovascular function study: Secondary | ICD-10-CM | POA: Insufficient documentation

## 2012-01-06 DIAGNOSIS — E785 Hyperlipidemia, unspecified: Secondary | ICD-10-CM | POA: Insufficient documentation

## 2012-01-06 DIAGNOSIS — I498 Other specified cardiac arrhythmias: Secondary | ICD-10-CM | POA: Insufficient documentation

## 2012-01-06 DIAGNOSIS — I5022 Chronic systolic (congestive) heart failure: Secondary | ICD-10-CM | POA: Insufficient documentation

## 2012-01-06 DIAGNOSIS — J439 Emphysema, unspecified: Secondary | ICD-10-CM | POA: Insufficient documentation

## 2012-01-06 HISTORY — PX: IMPLANTABLE CARDIOVERTER DEFIBRILLATOR IMPLANT: SHX5473

## 2012-01-06 LAB — SURGICAL PCR SCREEN
MRSA, PCR: NEGATIVE
Staphylococcus aureus: NEGATIVE

## 2012-01-06 SURGERY — IMPLANTABLE CARDIOVERTER DEFIBRILLATOR IMPLANT
Anesthesia: LOCAL

## 2012-01-06 MED ORDER — CHLORHEXIDINE GLUCONATE 4 % EX LIQD
60.0000 mL | Freq: Once | CUTANEOUS | Status: DC
  Filled 2012-01-06: qty 60

## 2012-01-06 MED ORDER — MUPIROCIN 2 % EX OINT
TOPICAL_OINTMENT | CUTANEOUS | Status: AC
  Filled 2012-01-06: qty 22

## 2012-01-06 MED ORDER — SODIUM CHLORIDE 0.9 % IV SOLN
INTRAVENOUS | Status: DC
  Administered 2012-01-06: 16:00:00 via INTRAVENOUS

## 2012-01-06 MED ORDER — MIDAZOLAM HCL 5 MG/5ML IJ SOLN
INTRAMUSCULAR | Status: AC
  Filled 2012-01-06: qty 5

## 2012-01-06 MED ORDER — FUROSEMIDE 40 MG PO TABS
40.0000 mg | ORAL_TABLET | Freq: Every day | ORAL | Status: DC
  Filled 2012-01-06: qty 1

## 2012-01-06 MED ORDER — ISOSORBIDE MONONITRATE 15 MG HALF TABLET
15.0000 mg | ORAL_TABLET | Freq: Every day | ORAL | Status: DC
  Administered 2012-01-06: 15 mg via ORAL
  Filled 2012-01-06 (×2): qty 1

## 2012-01-06 MED ORDER — CEFAZOLIN SODIUM 1-5 GM-% IV SOLN
1.0000 g | Freq: Four times a day (QID) | INTRAVENOUS | Status: AC
  Administered 2012-01-06 – 2012-01-07 (×3): 1 g via INTRAVENOUS
  Filled 2012-01-06 (×3): qty 50

## 2012-01-06 MED ORDER — ASPIRIN EC 81 MG PO TBEC
81.0000 mg | DELAYED_RELEASE_TABLET | Freq: Every day | ORAL | Status: DC
Start: 2012-01-06 — End: 2012-01-07
  Administered 2012-01-06: 81 mg via ORAL
  Filled 2012-01-06 (×2): qty 1

## 2012-01-06 MED ORDER — FINASTERIDE 5 MG PO TABS
5.0000 mg | ORAL_TABLET | Freq: Every morning | ORAL | Status: DC
Start: 2012-01-07 — End: 2012-01-07
  Filled 2012-01-06: qty 1

## 2012-01-06 MED ORDER — MUPIROCIN 2 % EX OINT
TOPICAL_OINTMENT | Freq: Two times a day (BID) | CUTANEOUS | Status: DC
  Administered 2012-01-06: 23:00:00 via NASAL
  Administered 2012-01-06: 1 via NASAL
  Filled 2012-01-06: qty 22

## 2012-01-06 MED ORDER — SODIUM CHLORIDE 0.45 % IV SOLN
INTRAVENOUS | Status: DC
  Administered 2012-01-06: 12:00:00 via INTRAVENOUS

## 2012-01-06 MED ORDER — FLUTICASONE-SALMETEROL 250-50 MCG/DOSE IN AEPB
1.0000 | INHALATION_SPRAY | Freq: Two times a day (BID) | RESPIRATORY_TRACT | Status: DC
  Administered 2012-01-07: 1 via RESPIRATORY_TRACT
  Filled 2012-01-06: qty 14

## 2012-01-06 MED ORDER — TAMSULOSIN HCL 0.4 MG PO CAPS
0.4000 mg | ORAL_CAPSULE | Freq: Every day | ORAL | Status: DC
  Administered 2012-01-06: 0.4 mg via ORAL
  Filled 2012-01-06 (×2): qty 1

## 2012-01-06 MED ORDER — ACETAMINOPHEN 325 MG PO TABS
325.0000 mg | ORAL_TABLET | ORAL | Status: DC | PRN
  Administered 2012-01-06 – 2012-01-07 (×2): 650 mg via ORAL
  Filled 2012-01-06 (×2): qty 2

## 2012-01-06 MED ORDER — PANTOPRAZOLE SODIUM 40 MG PO TBEC: 40.0000 mg | DELAYED_RELEASE_TABLET | Freq: Every day | ORAL | Status: DC

## 2012-01-06 MED ORDER — SODIUM CHLORIDE 0.9 % IV SOLN: INTRAVENOUS | Status: DC

## 2012-01-06 MED ORDER — LIDOCAINE HCL (PF) 1 % IJ SOLN
INTRAMUSCULAR | Status: AC
  Filled 2012-01-06: qty 60

## 2012-01-06 MED ORDER — CEFAZOLIN SODIUM-DEXTROSE 2-3 GM-% IV SOLR
INTRAVENOUS | Status: AC
  Filled 2012-01-06: qty 50

## 2012-01-06 MED ORDER — LISINOPRIL 40 MG PO TABS
40.0000 mg | ORAL_TABLET | Freq: Every day | ORAL | Status: DC
  Filled 2012-01-06: qty 1

## 2012-01-06 MED ORDER — ONDANSETRON HCL 4 MG/2ML IJ SOLN: 4.0000 mg | Freq: Four times a day (QID) | INTRAMUSCULAR | Status: DC | PRN

## 2012-01-06 MED ORDER — HEPARIN (PORCINE) IN NACL 2-0.9 UNIT/ML-% IJ SOLN
INTRAMUSCULAR | Status: AC
  Filled 2012-01-06: qty 1000

## 2012-01-06 MED ORDER — FENTANYL CITRATE 0.05 MG/ML IJ SOLN
INTRAMUSCULAR | Status: AC
  Filled 2012-01-06: qty 2

## 2012-01-06 MED ORDER — ADULT MULTIVITAMIN W/MINERALS CH
1.0000 | ORAL_TABLET | Freq: Every day | ORAL | Status: DC
  Filled 2012-01-06: qty 1

## 2012-01-06 MED ORDER — METOPROLOL TARTRATE 25 MG PO TABS
25.0000 mg | ORAL_TABLET | Freq: Two times a day (BID) | ORAL | Status: DC
  Administered 2012-01-06: 25 mg via ORAL
  Filled 2012-01-06 (×3): qty 1

## 2012-01-06 MED ORDER — VITAMIN D3 25 MCG (1000 UNIT) PO TABS
1000.0000 [IU] | ORAL_TABLET | Freq: Every day | ORAL | Status: DC
  Filled 2012-01-06: qty 1

## 2012-01-06 NOTE — H&P (Signed)
History and Physical  Patient ID: Corey Tyler MRN: [id number], SOB: 1940/12/31 71 y.o. Date of Encounter: 01/06/2012, 10:38 AM  Primary Physician: [name], MD, MD Primary Cardiologist: [facility] Primary Electrophysiologist:  Graciela Husbands  Chief Complaint: here for ICD  History of Present Illness: Corey Tyler is a 71 y.o. male here for ICD implantation   He has past history of a silent myocardial infarction.  Echocardiogram from 2008 demonstrated an EF of 40-45% with inferior/posterior hypokinesis. I retrieved a cardiac catheterization from 2000 demonstrating normal coronary arteries but he did have a mildly reduced ejection fraction with wall motion abnormality. He's had stress perfusion studies at another practice. The last of these was in 2010. There was evidence for an EF of about 35%. There was an inferior wall perfusion defect consistent with diaphragmatic attenuation.  Subsequent Echo demonstrated ejection fraction of 25%. Myoview confirmed this ejection fraction it showed a new dense inferior wall motion abnormality consistent with intercurrent infarction. There was no ischemia .  Because of dyspnea and decrease EF underwent cath demonstrating minmal CAD with EF 20-25% despite guideline directed medical therapy His dyspnea has improved with the introduction of diuretics.   Attempts to start aldactone complicated by fatigue        Past Medical History  Diagnosis Date  . Old myocardial infarction     Silent MI; cath in 2000 showing normal coronaries with mildly reduced EF with wall motion abnormality  . Unspecified essential hypertension   . COPD (chronic obstructive pulmonary disease)   . Hyperlipidemia   . Systolic heart failure     EF down to 20 to 25% per echo November 2012  . Dyspnea   . Abnormal nuclear cardiac imaging test November 2012    Large fixed inferolateral defect consistent with prior infarct; No ischemia;   . Pulmonary nodule, right     Last scan in 2010  showing stability; felt to be benign.     Past Surgical History  Procedure Date  . Colon surgery   . Cardiac catheterization 2000    No current facility-administered medications on file prior to encounter.   Current Outpatient Prescriptions on File Prior to Encounter  Medication Sig Dispense Refill  . aspirin EC 81 MG tablet Take 81 mg by mouth at bedtime.       . cetirizine (ZYRTEC) 10 MG tablet Take 10 mg by mouth daily as needed. For allergies      . cholecalciferol (VITAMIN D) 1000 UNITS tablet Take 1,000 Units by mouth daily.      . fluticasone (FLONASE) 50 MCG/ACT nasal spray Place 1 spray into the nose daily as needed. For congestion      . furosemide (LASIX) 40 MG tablet Take 40 mg by mouth daily.      . isosorbide mononitrate (IMDUR) 30 MG 24 hr tablet Take 15 mg by mouth at bedtime.        Marland Kitchen lisinopril (PRINIVIL,ZESTRIL) 40 MG tablet Take 40 mg by mouth daily.       . metoprolol tartrate (LOPRESSOR) 25 MG tablet Take 25 mg by mouth 2 (two) times daily.      . Omega-3 Fatty Acids (FISH OIL) 1200 MG CAPS Take 1 capsule by mouth 2 (two) times daily.        . pantoprazole (PROTONIX) 40 MG tablet Take 40 mg by mouth daily.        . psyllium (METAMUCIL) 58.6 % powder Take 1 packet by mouth daily.       Marland Kitchen  rosuvastatin (CRESTOR) 5 MG tablet Take 5 mg by mouth daily.        . Tamsulosin HCl (FLOMAX) 0.4 MG CAPS Take 0.4 mg by mouth daily.        Marland Kitchen DISCONTD: isosorbide mononitrate (IMDUR) 30 MG 24 hr tablet Take 1 tablet (30 mg total) by mouth at bedtime.  30 tablet  11    Current Facility-Administered Medications  Medication Dose Route Frequency Provider Last Rate Last Dose  . 0.45 % sodium chloride infusion   Intravenous Continuous Duke Salvia, MD      . 0.9 %  sodium chloride infusion   Intravenous Continuous Duke Salvia, MD      . ceFAZolin (ANCEF) IVPB 2 g/50 mL premix  2 g Intravenous On Call Duke Salvia, MD      . chlorhexidine (HIBICLENS) 4 % liquid 4 application   60 mL Topical Once Duke Salvia, MD      . gentamicin (GARAMYCIN) 80 mg in sodium chloride irrigation 0.9 % 500 mL irrigation  80 mg Irrigation On Call Duke Salvia, MD      . mupirocin ointment Idelle Jo) 2 %   Nasal BID Duke Salvia, MD         Allergies: Allergies  Allergen Reactions  . Atorvastatin Other (See Comments)    cough     History  Substance Use Topics  . Smoking status: Former Smoker -- 2.5 packs/day for 40 years    Types: Cigarettes    Quit date: 11/24/1983  . Smokeless tobacco: Never Used   Comment: 2 1/2 ppd x 40 years  . Alcohol Use: No      Family History  Problem Relation Age of Onset  . Emphysema Mother   . Heart disease Mother     AVR  . Heart failure Father     Died agwe 23      ROS:  Please see the history of present illness.  All other systems reviewed and negative.   Vital Signs: Blood pressure 113/63, pulse 50, temperature 97.8 F (36.6 C), temperature source Oral, resp. rate 18, height 6\' 1"  (1.854 m), weight 209 lb (94.802 kg), SpO2 94.00%.  PHYSICAL EXAM: General:  Well nourished, well developed male in no acute distress   HEENT: normal Lymph: no adenopathy Neck: no JVD Airway 1 Endocrine:  No thryomegaly Vascular: No carotid bruits; FA pulses 2+ bilaterally   Cardiac:  normal S1, S2; RRR; no murmur Back: without kyphosis/scoliosis, no CVA tenderness Lungs:  clear to auscultation bilaterally, no wheezing, rhonchi or rales Abd: soft, nontender, no hepatomegaly Ext: no edema Musculoskeletal:  No deformities, BUE and BLE strength normal and equal Skin: warm and dry Neuro:  CNs 2-12 intact, no focal abnormalities noted Psych:  Normal affect      Labs:   Lab Results  Component Value Date   WBC 6.3 01/05/2012   HGB 13.5 01/05/2012   HCT 39.8 01/05/2012   MCV 93.5 01/05/2012   PLT 209.0 01/05/2012    Lab 01/05/12 0839  NA 136  K 4.1  CL 102  CO2 28  BUN 15  CREATININE 1.2  CALCIUM 8.8  PROT --  BILITOT --    ALKPHOS --  ALT --  AST --  GLUCOSE 96   No results found for this basename: CKTOTAL:4,CKMB:4,TROPONINI:4 in the last 72 hours Lab Results  Component Value Date   CHOL  Value: 117        ATP III CLASSIFICATION:  <  200     mg/dL   Desirable  161-096  mg/dL   Borderline High  >=045    mg/dL   High 4/0/9811   HDL 39* 04/28/2007   LDLCALC  Value: 61        Total Cholesterol/HDL:CHD Risk Coronary Heart Disease Risk Table                     Men   Women  1/2 Average Risk   3.4   3.3 04/28/2007   TRIG 85 04/28/2007   Lab Results  Component Value Date   DDIMER  Value: 0.22        AT THE INHOUSE ESTABLISHED CUTOFF VALUE OF 0.48 ug/mL FEU, THIS ASSAY HAS BEEN DOCUMENTED IN THE LITERATURE TO HAVE 04/27/2007   BNP Pro B Natriuretic peptide (BNP)  Date/Time Value Range Status  11/12/2011 10:24 AM 51.0  0.0-100.0 (pg/mL) Final  11/04/2011 10:09 AM 79.0  0.0-100.0 (pg/mL) Final  10/27/2011 11:18 AM 278.0* 0.0-100.0 (pg/mL) Final  04/27/2007  9:50 AM <30.0   Final       ASSESSMENT AND PLAN:   Pt with persistent LV dysfunction despite maximally tolerated guideline directed medical therapy and EF 20-5%  Have reviewed the potential benefits and risks of ICD implantation including but not limited to death, perforation of heart or lung, lead dislodgement, infection,  device malfunction and inappropriate shocks.  The patient and family express understanding  and are willing to proceed.   Duke Salvia, MD  01/06/2012, 10:38 AM

## 2012-01-06 NOTE — Brief Op Note (Signed)
01/06/2012  2:00 PM  PATIENT:  Corey Tyler  72 y.o. male  PRE-OPERATIVE DIAGNOSIS:  Heart Failure; nonischemic cardiomyopathy, bradycardia  POST-OPERATIVE DIAGNOSIS:  nonischemic cardiomyopathy, bradycardia  PROCEDURE:  Procedure(s) (LRB): IMPLANTABLE CARDIOVERTER DEFIBRILLATOR IMPLANT (N/A)  SURGEON:  Surgeon(s) and Role:    * Duke Salvia, MD - Primary  PHYSICIAN ASSISTANT:   ASSISTANTS: none   ANESTHESIA:   regional and IV sedation   DICTATION: .Other Dictation: Dictation Number [id number]  PLAN OF CARE: Admit for overnight observation  PATIENT DISPOSITION:  PACU - hemodynamically stable.   Delay start of Pharmacological VTE agent (>24hrs) due to surgical blood loss or risk of bleeding: no

## 2012-01-07 ENCOUNTER — Other Ambulatory Visit: Payer: Self-pay

## 2012-01-07 ENCOUNTER — Ambulatory Visit (HOSPITAL_COMMUNITY): Payer: BC Managed Care – PPO

## 2012-01-07 DIAGNOSIS — E785 Hyperlipidemia, unspecified: Secondary | ICD-10-CM | POA: Insufficient documentation

## 2012-01-07 MED ORDER — MUPIROCIN 2 % EX OINT: TOPICAL_OINTMENT | Freq: Two times a day (BID) | CUTANEOUS | Status: DC

## 2012-01-07 MED ORDER — HYDROCORTISONE 1 % EX LOTN
TOPICAL_LOTION | Freq: Two times a day (BID) | CUTANEOUS | Status: DC | PRN
  Administered 2012-01-07: 09:00:00 via TOPICAL
  Filled 2012-01-07: qty 118

## 2012-01-07 NOTE — Discharge Summary (Signed)
Patient ID: ROMELO Tyler,  MRN: [id number], DOB/AGE: 1941/08/20 71 y.o.  Admit date: 01/06/2012 Discharge date: 01/07/2012  Primary Care Provider: Pixie Casino Primary Cardiologist: J. Hochrein/SGraciela Husbands  Discharge Diagnoses Principal Problem:  *Nonischemic cardiomyopathy Active Problems:  HYPERTENSION  COPD  Chronic systolic heart failure  Hyperlipidemia   Allergies Allergies  Allergen Reactions  . Atorvastatin Other (See Comments)    cough    Procedures  01/06/2012  Successful placement of a St. [name] Fortify DR ICD (single coil device)  History of Present Illness  And 71 year old male with history of nonischemic cardiomyopathy status post recent diagnostic catheterization revealing nonobstructive coronary artery disease with an ejection fraction of 20-25% despite guideline directed medical therapy. With this finding, it was felt that patient was an appropriate candidate for AICD placement and this was arranged in the outpatient setting.   Hospital Course  Patient presented to the [facility] electrophysiology lab on February 13 and underwent successful placement of a St. [name] Fortify DR single-lead AICD. Patient tolerated procedure well and post procedure chest x-ray shows no evidence of pneumothorax. Patient will be discharged home today in good condition.   Discharge Vitals Blood pressure 144/77, pulse 65, temperature 97.5 F (36.4 C), temperature source Oral, resp. rate 20, height 6\' 1"  (1.854 m), weight 209 lb (94.802 kg), SpO2 94.00%.  Filed Weights   01/06/12 1022  Weight: 209 lb (94.802 kg)    Labs  CBC  Basename 01/05/12 0839  WBC 6.3  NEUTROABS 3.6  HGB 13.5  HCT 39.8  MCV 93.5  PLT 209.0   Basic Metabolic Panel  Basename 01/05/12 0839  NA 136  K 4.1  CL 102  CO2 28  GLUCOSE 96  BUN 15  CREATININE 1.2  CALCIUM 8.8  MG --  PHOS --    Disposition  Pt is being discharged home today in good condition.  Follow-up Plans &  Appointments  Follow-up Information    Follow up with Sherryl Manges, MD on 04/07/2012. (10:00)    Contact information:   [address], [address] [place] [address] [phone]       Follow up with [facility] on 01/18/2012. (3:30)    Contact information:   [address] [place] [phone]         Discharge Medications  Medication List  As of 01/07/2012  1:46 PM   TAKE these medications         aspirin EC 81 MG tablet   Take 81 mg by mouth at bedtime.      cetirizine 10 MG tablet   Commonly known as: ZYRTEC   Take 10 mg by mouth daily as needed. For allergies      cholecalciferol 1000 UNITS tablet   Commonly known as: VITAMIN D   Take 1,000 Units by mouth daily.      finasteride 5 MG tablet   Commonly known as: PROSCAR   Take 5 mg by mouth every morning.      Fish Oil 1200 MG Caps   Take 1 capsule by mouth 2 (two) times daily.      FLOMAX 0.4 MG Caps   Generic drug: Tamsulosin HCl   Take 0.4 mg by mouth daily.      fluticasone 50 MCG/ACT nasal spray   Commonly known as: FLONASE   Place 1 spray into the nose daily as needed. For congestion      Fluticasone-Salmeterol 250-50 MCG/DOSE Aepb   Commonly known as:  ADVAIR   Inhale 1 puff into the lungs 2 (two) times daily.      furosemide 40 MG tablet   Commonly known as: LASIX   Take 40 mg by mouth daily.      isosorbide mononitrate 30 MG 24 hr tablet   Commonly known as: IMDUR   Take 15 mg by mouth at bedtime.      lisinopril 40 MG tablet   Commonly known as: PRINIVIL,ZESTRIL   Take 40 mg by mouth daily.      metoprolol tartrate 25 MG tablet   Commonly known as: LOPRESSOR   Take 25 mg by mouth 2 (two) times daily.      mulitivitamin with minerals Tabs   Take 1 tablet by mouth daily.      nitroGLYCERIN 0.4 MG SL tablet   Commonly known as: NITROSTAT   Place 0.4 mg under the tongue every 5 (five) minutes as needed. For chest pain      pantoprazole  40 MG tablet   Commonly known as: PROTONIX   Take 40 mg by mouth daily.      psyllium 58.6 % powder   Commonly known as: METAMUCIL   Take 1 packet by mouth daily.      rosuvastatin 5 MG tablet   Commonly known as: CRESTOR   Take 5 mg by mouth daily.            Outstanding Labs/Studies  None  Duration of Discharge Encounter   Greater than 30 minutes including physician time.  Signed, Nicolasa Ducking NP 01/07/2012, 1:46 PM

## 2012-01-07 NOTE — Progress Notes (Signed)
SUBJECTIVE: The patient is doing well today.  At this time, he denies chest pain, shortness of breath, or any new concerns.     Marland Kitchen aspirin EC  81 mg Oral QHS  . ceFAZolin      .  ceFAZolin (ANCEF) IV  1 g Intravenous Q6H  . cholecalciferol  1,000 Units Oral Daily  . fentaNYL      . finasteride  5 mg Oral q morning - 10a  . Fluticasone-Salmeterol  1 puff Inhalation BID  . furosemide  40 mg Oral Daily  . heparin      . isosorbide mononitrate  15 mg Oral QHS  . lidocaine      . lisinopril  40 mg Oral Daily  . metoprolol tartrate  25 mg Oral BID  . midazolam      . midazolam      . mulitivitamin with minerals  1 tablet Oral Daily  . mupirocin ointment   Nasal BID  . pantoprazole  40 mg Oral Q1200  . Tamsulosin HCl  0.4 mg Oral Daily  . DISCONTD:  ceFAZolin (ANCEF) IV  2 g Intravenous On Call  . DISCONTD: chlorhexidine  60 mL Topical Once  . DISCONTD: gentamicin irrigation  80 mg Irrigation On Call      . sodium chloride 50 mL/hr at 01/06/12 1543  . DISCONTD: sodium chloride 50 mL/hr at 01/06/12 1147  . DISCONTD: sodium chloride      OBJECTIVE: Physical Exam: Filed Vitals:   01/06/12 1507 01/06/12 2130 01/07/12 0500 01/07/12 0831  BP: 129/74 143/71 144/77   Pulse: 60 66 65   Temp:  98 F (36.7 C) 97.5 F (36.4 C)   TempSrc:  Oral Oral   Resp: 18 20 20    Height:      Weight:      SpO2: 94% 91% 89% 94%    Intake/Output Summary (Last 24 hours) at 01/07/12 0849 Last data filed at 01/07/12 0814  Gross per 24 hour  Intake    240 ml  Output    500 ml  Net   -260 ml    Telemetry reveals sinus rhythm  GEN- The patient is well appearing, alert and oriented x 3 today.   Head- normocephalic, atraumatic Eyes-  Sclera clear, conjunctiva pink Ears- hearing intact Oropharynx- clear Neck- supple, no JVP Lymph- no cervical lymphadenopathy Lungs- Clear to ausculation bilaterally, normal work of breathing Heart- Regular rate and rhythm, no murmurs, rubs or gallops, PMI not  laterally displaced GI- soft, NT, ND, + BS Extremities- no clubbing, cyanosis, or edema ICD pocket with small ecchymosis, but significant hematoma  LABS: Basic Metabolic Panel:  Basename 01/05/12 0839  NA 136  K 4.1  CL 102  CO2 28  GLUCOSE 96  BUN 15  CREATININE 1.2  CALCIUM 8.8  MG --  PHOS --   Liver Function Tests: No results found for this basename: AST:2,ALT:2,ALKPHOS:2,BILITOT:2,PROT:2,ALBUMIN:2 in the last 72 hours No results found for this basename: LIPASE:2,AMYLASE:2 in the last 72 hours CBC:  Basename 01/05/12 0839  WBC 6.3  NEUTROABS 3.6  HGB 13.5  HCT 39.8  MCV 93.5  PLT 209.0   CXR this am is pending   ASSESSMENT AND PLAN:  Active Problems:  Chronic systolic heart failure  Nonischemic cardiomyopathy  PVC (premature ventricular contraction)  1.  Chronic systolic dysfunction- doing well s/p ICD implantation Routine wound care 10 day wound check and 3 months follow-up with Dr Graciela Husbands  DC to home today on home medicine  regimen pending results of this am CXR.   Hillis Range, MD 01/07/2012 8:49 AM

## 2012-01-07 NOTE — Op Note (Signed)
Corey Tyler, Corey Tyler                ACCOUNT NO.:  [id number]  MEDICAL RECORD NO.:  [id number]  LOCATION:  3714                         FACILITY:  [facility]  PHYSICIAN:  Duke Salvia, MD, FACCDATE OF BIRTH:  1941/01/03  DATE OF PROCEDURE:  01/06/2012 DATE OF DISCHARGE:                              OPERATIVE REPORT   PREOPERATIVE DIAGNOSES:  Nonischemic cardiomyopathy and bradycardia with heart rates in the high 40s and low 50s.  POSTOPERATIVE DIAGNOSES:  Nonischemic cardiomyopathy and bradycardia with heart rates in the high 40s and low 50s.  PROCEDURE:  Dual-chamber defibrillator implantation.  Intraoperative defibrillation threshold testing.  Following obtaining informed consent, the patient was brought to the electrophysiology laboratory and placed on the fluoroscopic table in supine position.  After routine prep and drape of the left upper chest, lidocaine was infiltrated in the prepectoral subclavicular region. Incision was made and carried down to layer of the prepectoral fascia. A pocket was formed similarly.  Hemostasis was obtained.  Thereafter, attention was turned to gain access to the extrathoracic left subclavian vein, which was accomplished without difficulty, without the aspiration or puncture of the artery.  Two separate venipunctures were accomplished.  Guidewires were placed and retained.  Sequentially, an 8-French and 7-French sheaths were placed, which were passed a [employer] [name] [id number]  quadripolar IS4 lead serial number [id number] and an atrial lead St. [name] 2088PC, 52 cm length serial number [id number].  Under fluoroscopic guidance, these were manipulated into the right ventricular apex and into the right atrial appendage respectively.  Multiple sites were mapped in the right ventricular apex with R-waves only in the 2s and in the 3s.  We then put the lead on the septum where the bipolar R- wave was 10.2 with a pace impedance of 929, a threshold 0.8 at 0.5.   The current of threshold was 2.8 mA.  There is no diaphragmatic pacing at 10 V.  The current of injury was modest.  This lead was secured to the prepectoral fascia.  The atrial lead was deployed into the atrial appendage and noted with the bipolar P-wave was 6.1 with a pace impedance of 779, a threshold 1.1 volts at 0.5 msec shortly after the lead deployment.  The current of injury was brisk.  The current of threshold was 1.3 mA.  There was no diaphragmatic pacing at 10 V.  These leads were secured to the prepectoral fascia and then attached to a [employer]. [name] Fortify DR ICD model [id number], serial number [id number].  Ventricular pacing and atrial pacing were identified.  The pocket was copiously irrigated with antibiotic- containing saline solution.  The leads and pulse generator were placed in the pocket and secured to the prepectoral fascia.  Surgicel was placed on the posterior aspect of the pocket where I had violated the pectoral fascia and then DFT test was undertaken.  Ventricular fibrillation was induced via the T-wave shock after total duration of 6 seconds, a 15.6 joule shock was delivered through a measured resistance of an 81 ohms, terminating ventricular fibrillation and restoring sinus rhythm.  THE LEAD IS A SINGLE COIL DEFIBRILLATOR LEAD.  The device was implanted. The wound was closed in 3 layers in normal  fashion.  The wound was washed, dried, and Benzoin and Steri-Strips was applied.  Needle counts, sponge counts, and instrument counts were correct at the end of the procedure according to the staff.  The patient tolerated the procedure without apparent complications.     Duke Salvia, MD, [facility]     SCK/MEDQ  D:  01/06/2012  T:  01/07/2012  Job:  [id number]

## 2012-01-07 NOTE — Discharge Instructions (Signed)
   Supplemental Discharge Instructions for  Pacemaker/Defibrillator Patients  Activity No heavy lifting or vigorous activity with your left/right arm for 6 to 8 weeks.  Do not raise your left/right arm above your head for one week.  Gradually raise your affected arm as drawn below.                        2/18                /          2/19               /          2/20            /          2/21             NO DRIVING for      ; you may begin driving on   3/08   . WOUND CARE   Keep the wound area clean and dry.  Do not get this area wet for one week. No showers for one week; you may shower on              .   The tape/steri-strips on your wound will fall off; do not pull them off.  No bandage is needed on the site.  DO  NOT apply any creams, oils, or ointments to the wound area.   If you notice any drainage or discharge from the wound, any swelling or bruising at the site, or you develop a fever > 101? F after you are discharged home, call the office at once.  Special Instructions   You are still able to use cellular telephones; use the ear opposite the side where you have your pacemaker/defibrillator.  Avoid carrying your cellular phone near your device.   When traveling through airports, show security personnel your identification card to avoid being screened in the metal detectors.  Ask the security personnel to use the hand wand.   Avoid arc welding equipment, MRI testing (magnetic resonance imaging), TENS units (transcutaneous nerve stimulators).  Call the office for questions about other devices.   Avoid electrical appliances that are in poor condition or are not properly grounded.   Microwave ovens are safe to be near or to operate.  Additional information for defibrillator patients should your device go off:   If your device goes off ONCE and you feel fine afterward, notify the device clinic nurses.   If your device goes off ONCE and you do not feel well afterward, call 911.   If  your device goes off TWICE, call 911.   If your device goes off THREE times in one day, call 911.  DO NOT DRIVE YOURSELF OR A FAMILY MEMBER WITH A DEFIBRILLATOR TO THE HOSPITAL--CALL 911.  ***PLEASE REMEMBER TO BRING ALL OF YOUR MEDICATIONS TO EACH OF YOUR FOLLOW-UP OFFICE VISITS.

## 2012-01-07 NOTE — Progress Notes (Signed)
Pt and family provided with d/c instructions. Pt was educated on ICD discharge instructions and follow up appointments. No questions at this time. Pt left unit in wheel chair. Ramond Craver, RN

## 2012-01-18 ENCOUNTER — Ambulatory Visit (INDEPENDENT_AMBULATORY_CARE_PROVIDER_SITE_OTHER): Payer: BC Managed Care – PPO | Admitting: *Deleted

## 2012-01-18 ENCOUNTER — Encounter: Payer: Self-pay | Admitting: Internal Medicine

## 2012-01-18 DIAGNOSIS — I428 Other cardiomyopathies: Secondary | ICD-10-CM

## 2012-01-18 DIAGNOSIS — I5022 Chronic systolic (congestive) heart failure: Secondary | ICD-10-CM

## 2012-01-18 LAB — ICD DEVICE OBSERVATION
AL THRESHOLD: 0.5 V
ATRIAL PACING ICD: 33 pct
BAMS-0001: 170 {beats}/min
DEV-0020ICD: NEGATIVE
DEVICE MODEL ICD: 7003419
HV IMPEDENCE: 53 Ohm
MODE SWITCH EPISODES: 5
PACEART VT: 0
RV LEAD THRESHOLD: 1 V
TOT-0006: 20130213000000
TOT-0010: 2
TZAT-0013SLOWVT: 3
TZST-0001SLOWVT: 2
TZST-0001SLOWVT: 5
TZST-0003SLOWVT: 890 V
TZST-0003SLOWVT: 890 V
VENTRICULAR PACING ICD: 0.48 pct

## 2012-01-18 NOTE — Progress Notes (Signed)
Wound check-ICD 

## 2012-01-19 ENCOUNTER — Telehealth: Payer: Self-pay | Admitting: Internal Medicine

## 2012-01-19 NOTE — Telephone Encounter (Signed)
Spoke with patient he believes his device alarmed during the night.  No shock was felt.  We will bring him in tomorrow 2/27 @ 10am for an interrogation.

## 2012-01-19 NOTE — Telephone Encounter (Signed)
Corey Tyler is concerned because his defibrillator went off during the night.  He states is buzzed but he didn't get a shock.  He would like someone to call him regarding this matter.

## 2012-01-19 NOTE — Telephone Encounter (Signed)
Corey Tyler is requesting a letter from Dr Graciela Husbands to go back to work.

## 2012-01-19 NOTE — Telephone Encounter (Signed)
New msg Pt called. He said he had some paperwork for Dr Graciela Husbands to fill out. Please call

## 2012-01-20 ENCOUNTER — Encounter: Payer: Self-pay | Admitting: *Deleted

## 2012-01-20 ENCOUNTER — Encounter: Payer: Self-pay | Admitting: Internal Medicine

## 2012-01-20 ENCOUNTER — Ambulatory Visit (INDEPENDENT_AMBULATORY_CARE_PROVIDER_SITE_OTHER): Payer: BC Managed Care – PPO | Admitting: *Deleted

## 2012-01-20 DIAGNOSIS — I428 Other cardiomyopathies: Secondary | ICD-10-CM

## 2012-01-20 LAB — ICD DEVICE OBSERVATION
AL IMPEDENCE ICD: 510 Ohm
AL THRESHOLD: 0.5 V
BAMS-0003: 70 {beats}/min
BRDY-0003RA: 120 {beats}/min
BRDY-0004RA: 120 {beats}/min
CHARGE TIME: 8.4 s
TZAT-0004SLOWVT: 8
TZAT-0012SLOWVT: 200 ms
TZAT-0013SLOWVT: 3
TZAT-0020SLOWVT: 1.5 ms
TZON-0004SLOWVT: 30
TZON-0005SLOWVT: 6
TZST-0001SLOWVT: 3
TZST-0001SLOWVT: 4
TZST-0001SLOWVT: 5
TZST-0003SLOWVT: 890 V

## 2012-01-20 NOTE — Progress Notes (Signed)
Pt thought alert vibration went off on 01-19-12 around 5 am and no other times. Demonstrated alert vibration and explained to pt it would keep alerting every 10 hours for 16 times. Pt voices understanding. Pt is scheduled for follow up with SK 04-07-12.

## 2012-04-06 ENCOUNTER — Encounter: Payer: Self-pay | Admitting: Internal Medicine

## 2012-04-06 ENCOUNTER — Ambulatory Visit (INDEPENDENT_AMBULATORY_CARE_PROVIDER_SITE_OTHER): Payer: BC Managed Care – PPO | Admitting: Internal Medicine

## 2012-04-06 VITALS — BP 120/76 | HR 75 | Ht 73.0 in | Wt 207.8 lb

## 2012-04-06 DIAGNOSIS — I5022 Chronic systolic (congestive) heart failure: Secondary | ICD-10-CM

## 2012-04-06 DIAGNOSIS — I498 Other specified cardiac arrhythmias: Secondary | ICD-10-CM

## 2012-04-06 DIAGNOSIS — I471 Supraventricular tachycardia: Secondary | ICD-10-CM

## 2012-04-06 DIAGNOSIS — I428 Other cardiomyopathies: Secondary | ICD-10-CM

## 2012-04-06 DIAGNOSIS — Z95810 Presence of automatic (implantable) cardiac defibrillator: Secondary | ICD-10-CM

## 2012-04-06 DIAGNOSIS — I4949 Other premature depolarization: Secondary | ICD-10-CM

## 2012-04-06 NOTE — Assessment & Plan Note (Signed)
Stable on current home concotion for his diuretic regime

## 2012-04-06 NOTE — Progress Notes (Signed)
HPI  Corey Tyler is a 71 y.o. male Seen following icd implantation 12/2011 for primary prevention with ischemic and nonischemic cardiomyopathy and bradycardia  He notes no significant improvement since ICD implant,   still with some DOE    He has found that if he take full dose lasix on a daily basis, he is weak and wasted, so he takes 1/2 dose and is doing well on this  Past Medical History  Diagnosis Date  . Old myocardial infarction     Silent MI; cath in 2000 showing normal coronaries with mildly reduced EF with wall motion abnormality  . Unspecified essential hypertension   . COPD (chronic obstructive pulmonary disease)   . Hyperlipidemia   . Systolic heart failure     EF down to 20 to 25% per echo November 2012  . Dyspnea   . Abnormal nuclear cardiac imaging test November 2012    Large fixed inferolateral defect consistent with prior infarct; No ischemia;   . Pulmonary nodule, right     Last scan in 2010 showing stability; felt to be benign.    Past Surgical History  Procedure Date  . Colon surgery   . Cardiac catheterization 2000    Current Outpatient Prescriptions  Medication Sig Dispense Refill  . aspirin EC 81 MG tablet Take 81 mg by mouth at bedtime.       . cetirizine (ZYRTEC) 10 MG tablet Take 10 mg by mouth daily as needed. For allergies      . cholecalciferol (VITAMIN D) 1000 UNITS tablet Take 1,000 Units by mouth daily.      . finasteride (PROSCAR) 5 MG tablet Take 5 mg by mouth every morning.      . fluticasone (FLONASE) 50 MCG/ACT nasal spray Place 1 spray into the nose daily as needed. For congestion      . Fluticasone-Salmeterol (ADVAIR) 250-50 MCG/DOSE AEPB Inhale 1 puff into the lungs 2 (two) times daily.      . furosemide (LASIX) 40 MG tablet Take 40 mg by mouth daily.      . isosorbide mononitrate (IMDUR) 30 MG 24 hr tablet Take 15 mg by mouth at bedtime.        Marland Kitchen lisinopril (PRINIVIL,ZESTRIL) 40 MG tablet Take 40 mg by mouth daily.       .  metoprolol tartrate (LOPRESSOR) 25 MG tablet Take 25 mg by mouth 2 (two) times daily.      . Multiple Vitamin (MULITIVITAMIN WITH MINERALS) TABS Take 1 tablet by mouth daily.      . nitroGLYCERIN (NITROSTAT) 0.4 MG SL tablet Place 0.4 mg under the tongue every 5 (five) minutes as needed. For chest pain      . Omega-3 Fatty Acids (FISH OIL) 1200 MG CAPS Take 1 capsule by mouth 2 (two) times daily.        . pantoprazole (PROTONIX) 40 MG tablet Take 40 mg by mouth daily.        . psyllium (METAMUCIL) 58.6 % powder Take 1 packet by mouth daily.       . rosuvastatin (CRESTOR) 5 MG tablet Take 5 mg by mouth daily.        . Tamsulosin HCl (FLOMAX) 0.4 MG CAPS Take 0.4 mg by mouth daily.        Marland Kitchen DISCONTD: isosorbide mononitrate (IMDUR) 30 MG 24 hr tablet Take 1 tablet (30 mg total) by mouth at bedtime.  30 tablet  11    Allergies  Allergen Reactions  . Atorvastatin  Other (See Comments)    cough    Review of Systems negative except from HPI and PMH  Physical Exam BP 120/76  Pulse 75  Ht 6\' 1"  (1.854 m)  Wt 207 lb 12.8 oz (94.257 kg)  BMI 27.42 kg/m2 Well developed and well nourished in no acute distress HENT normal E scleral and icterus clear Neck Supple JVP flat; carotids brisk and full Clear to ausculation Regular rate and rhythm, no murmurs gallops or rub Soft with active bowel sounds No clubbing cyanosis Trace Edema r>L Alert and oriented, grossly normal motor and sensory function Skin Warm and Dry   ECG: Sinus Rhythm/  @ 75            Intervals  16/10/41  Axis 7  pvx -freq  Prior IMI  NSTT      Assessment and  Plan

## 2012-04-06 NOTE — Patient Instructions (Addendum)
Your physician recommends that you schedule a follow-up appointment in: 3 months with Dr Antoine Poche  Your physician wants you to follow-up in: 9 months with Dr Graciela Husbands.  You will receive a reminder letter in the mail two months in advance. If you don't receive a letter, please call our office to schedule the follow-up appointment.

## 2012-04-06 NOTE — Assessment & Plan Note (Signed)
As above.

## 2012-04-06 NOTE — Assessment & Plan Note (Signed)
The patient's device was interrogated.  The information was reviewed. No changes were made in the programming.    Non sustained atrial tach<10 sec seen frequently

## 2012-04-06 NOTE — Assessment & Plan Note (Signed)
Stable  Continue current meds  Based on recent MADIT CRT substudy comparing carvedilol and metoprolol showing the benefit of the former, I would recommend this   He is now scheduled to see dr [facility] in three months and will defer that to him

## 2012-04-07 ENCOUNTER — Encounter: Payer: BC Managed Care – PPO | Admitting: Internal Medicine

## 2012-05-03 ENCOUNTER — Other Ambulatory Visit: Payer: Self-pay | Admitting: Critical Care Medicine

## 2012-06-18 ENCOUNTER — Other Ambulatory Visit: Payer: Self-pay | Admitting: Critical Care Medicine

## 2012-06-21 ENCOUNTER — Telehealth: Payer: Self-pay | Admitting: Critical Care Medicine

## 2012-06-21 MED ORDER — FLUTICASONE-SALMETEROL 250-50 MCG/DOSE IN AEPB: 1.0000 | INHALATION_SPRAY | Freq: Two times a day (BID) | RESPIRATORY_TRACT | Status: DC

## 2012-06-21 NOTE — Telephone Encounter (Signed)
Called and spoke with patient, patient requesting Advair Rx.  Patient last seen 05/05/2011 and scheduled for appt on 07/29/2012.  Informed patient that he MUST keep this appt for [place] to continue to fill his rxs.  Patient verbalized understanding, pharmacy verified and nothing further needed at this time.

## 2012-07-08 ENCOUNTER — Encounter: Payer: Self-pay | Admitting: Cardiology

## 2012-07-08 ENCOUNTER — Ambulatory Visit (INDEPENDENT_AMBULATORY_CARE_PROVIDER_SITE_OTHER): Payer: BC Managed Care – PPO | Admitting: *Deleted

## 2012-07-08 ENCOUNTER — Encounter: Payer: Self-pay | Admitting: Internal Medicine

## 2012-07-08 ENCOUNTER — Ambulatory Visit (INDEPENDENT_AMBULATORY_CARE_PROVIDER_SITE_OTHER): Payer: BC Managed Care – PPO | Admitting: Cardiology

## 2012-07-08 VITALS — BP 115/70 | HR 60 | Ht 73.0 in | Wt 206.4 lb

## 2012-07-08 DIAGNOSIS — I428 Other cardiomyopathies: Secondary | ICD-10-CM

## 2012-07-08 DIAGNOSIS — I10 Essential (primary) hypertension: Secondary | ICD-10-CM

## 2012-07-08 DIAGNOSIS — I471 Supraventricular tachycardia: Secondary | ICD-10-CM

## 2012-07-08 DIAGNOSIS — I498 Other specified cardiac arrhythmias: Secondary | ICD-10-CM

## 2012-07-08 LAB — ICD DEVICE OBSERVATION
AL AMPLITUDE: 3.7 mv
AL IMPEDENCE ICD: 450 Ohm
CHARGE TIME: 8.4 s
HV IMPEDENCE: 79 Ohm
RV LEAD IMPEDENCE ICD: 437.5 Ohm
TOT-0006: 20130213000000
TOT-0007: 1
TOT-0008: 0
TOT-0009: 1
TOT-0010: 3
TZAT-0013SLOWVT: 3
TZAT-0018SLOWVT: NEGATIVE
TZAT-0019SLOWVT: 7.5 V
TZON-0003SLOWVT: 300 ms
TZON-0004SLOWVT: 30
TZON-0010SLOWVT: 40 ms
TZST-0001SLOWVT: 2
TZST-0001SLOWVT: 3
TZST-0001SLOWVT: 5
TZST-0003SLOWVT: 890 V
TZST-0003SLOWVT: 890 V
VF: 0

## 2012-07-08 LAB — BASIC METABOLIC PANEL
BUN: 15 mg/dL (ref 6–23)
CO2: 26 mEq/L (ref 19–32)
Chloride: 106 mEq/L (ref 96–112)
Creatinine, Ser: 1.1 mg/dL (ref 0.4–1.5)

## 2012-07-08 NOTE — Progress Notes (Signed)
icd check in clinic  

## 2012-07-08 NOTE — Patient Instructions (Addendum)
Your physician recommends that you have lab work today 07/08/12.     Your physician wants you to follow-up in: 12/13. You will receive a reminder letter in the mail two months in advance. If you don't receive a letter, please call our office to schedule the follow-up appointment.

## 2012-07-08 NOTE — Progress Notes (Signed)
HPI The patient presents for evaluation of dyspnea. He reports a past history of a silent myocardial infarction. Echo from 2008 demonstrating an EF of 40-45% with inferior/posterior hypokinesis.  Cardiac catheterization from 2000 demonstrating normal coronary arteries but he did have a mildly reduced ejection fraction with wall motion abnormality. He's had stress perfusion studies at another practice. The last of these was in 2010. There was evidence for an EF of about 35%.  He had increasing dyspnea and had an echo with on EF found to be 20 - 25%.   Followup cath demonstrated only minimal coronary plaque. He subsequently had a defibrillator placed by Dr. Graciela Husbands.  Since I last saw him he has done well.  The patient denies any new symptoms such as chest discomfort, neck or arm discomfort. There has been no new shortness of breath, PND or orthopnea. There have been no reported palpitations, presyncope or syncope.  He does have some orthostatic symptoms.  However, he thinks that he is doing better since his ICD. He can mow his yard without stopping.  Allergies  Allergen Reactions  . Atorvastatin Other (See Comments)    cough    Current Outpatient Prescriptions  Medication Sig Dispense Refill  . aspirin EC 81 MG tablet Take 81 mg by mouth at bedtime.       . cetirizine (ZYRTEC) 10 MG tablet Take 10 mg by mouth daily as needed. For allergies      . cholecalciferol (VITAMIN D) 1000 UNITS tablet Take 1,000 Units by mouth daily.      . finasteride (PROSCAR) 5 MG tablet Take 5 mg by mouth every morning.      . fluticasone (FLONASE) 50 MCG/ACT nasal spray Place 1 spray into the nose daily as needed. For congestion      . Fluticasone-Salmeterol (ADVAIR DISKUS) 250-50 MCG/DOSE AEPB Inhale 1 puff into the lungs 2 (two) times daily.  60 each  2  . Fluticasone-Salmeterol (ADVAIR) 250-50 MCG/DOSE AEPB Inhale 1 puff into the lungs 2 (two) times daily.      . furosemide (LASIX) 40 MG tablet Take 40 mg by  mouth daily.      . isosorbide mononitrate (IMDUR) 30 MG 24 hr tablet Take 15 mg by mouth at bedtime.        Marland Kitchen lisinopril (PRINIVIL,ZESTRIL) 40 MG tablet Take 40 mg by mouth daily.       . metoprolol tartrate (LOPRESSOR) 25 MG tablet Take 25 mg by mouth 2 (two) times daily.      . Multiple Vitamin (MULITIVITAMIN WITH MINERALS) TABS Take 1 tablet by mouth daily.      . nitroGLYCERIN (NITROSTAT) 0.4 MG SL tablet Place 0.4 mg under the tongue every 5 (five) minutes as needed. For chest pain      . Omega-3 Fatty Acids (FISH OIL) 1200 MG CAPS Take 1 capsule by mouth 2 (two) times daily.        . pantoprazole (PROTONIX) 40 MG tablet Take 40 mg by mouth daily.        . psyllium (METAMUCIL) 58.6 % powder Take 1 packet by mouth daily.       . rosuvastatin (CRESTOR) 5 MG tablet Take 5 mg by mouth daily.        . Tamsulosin HCl (FLOMAX) 0.4 MG CAPS Take 0.4 mg by mouth daily.        Marland Kitchen DISCONTD: isosorbide mononitrate (IMDUR) 30 MG 24 hr tablet Take 1 tablet (30 mg total) by mouth at bedtime.  30 tablet  11    Past Medical History  Diagnosis Date  . Old myocardial infarction     Silent MI; Large fixed inferolateral defect consistent with prior infarct; No ischemia; cath in 2000 showing normal coronaries with mildly reduced EF with wall motion abnormality cath without obstruction  . Hypertension   . COPD (chronic obstructive pulmonary disease)   . Hyperlipidemia   . Chronic systolic heart failure     EF down to 20 to 25% per echo November 2012  . Cardiomyopathy - mixed November 2012  . Pulmonary nodule, right     Last scan in 2010 showing stability; felt to be benign.  . Cardiac defibrillator -dual  [employer]   . Atrial tachycardia-non sustained 04/06/2012    Past Surgical History  Procedure Date  . Colon surgery   . Cardiac catheterization 2000    ROS:  As stated in the HPI and negative for all other systems.   PHYSICAL EXAM BP 115/70  Pulse 60  Ht 6\' 1"  (1.854 m)  Wt 206 lb 6.4 oz  (93.622 kg)  BMI 27.23 kg/m2  SpO2 94% GENERAL:  Well appearing HEENT:  Pupils equal round and reactive, fundi not visualized, oral mucosa unremarkable, dentures NECK:  No jugular venous distention, waveform within normal limits, carotid upstroke brisk and symmetric, no bruits, no thyromegaly LYMPHATICS:  No cervical, inguinal adenopathy LUNGS:  Clear to auscultation bilaterally BACK:  No CVA tenderness CHEST:  Well healed ICD pocket.   HEART:  PMI not displaced or sustained,S1 and S2 within normal limits, no S3, no S4, no clicks, no rubs, no murmurs ABD:  Flat, positive bowel sounds normal in frequency in pitch, no bruits, no rebound, no guarding, no midline pulsatile mass, no hepatomegaly, no splenomegaly EXT:  2 plus pulses throughout, no edema, no cyanosis no clubbing   ASSESSMENT AND PLAN   Ischemic cardiomyopathy -  He's doing quite well and seems to be euvolemic. We did discuss the possibility of reducing the Lasix to 20 mg because of the lightheadedness. He'll do this if it becomes more severe. We discussed conservative measures to avoid orthostatic symptoms.  HYPERTENSION -  This is being managed in the context of treating his CHF  Palpitations -  He is not describing new tachycardia palpitations.

## 2012-07-13 ENCOUNTER — Telehealth: Payer: Self-pay | Admitting: Internal Medicine

## 2012-07-13 NOTE — Telephone Encounter (Signed)
Patient c/o fatigue and SOB.  Patient to come in tomorrow @ 3pm.

## 2012-07-13 NOTE — Telephone Encounter (Signed)
Please return call to patient [phone] regarding questions about his defib

## 2012-07-14 ENCOUNTER — Encounter: Payer: Self-pay | Admitting: Internal Medicine

## 2012-07-14 ENCOUNTER — Ambulatory Visit (INDEPENDENT_AMBULATORY_CARE_PROVIDER_SITE_OTHER): Payer: BC Managed Care – PPO | Admitting: *Deleted

## 2012-07-14 DIAGNOSIS — I428 Other cardiomyopathies: Secondary | ICD-10-CM

## 2012-07-14 LAB — ICD DEVICE OBSERVATION
HV IMPEDENCE: 77 Ohm
RV LEAD IMPEDENCE ICD: 387.5 Ohm
TOT-0006: 20130213000000
TOT-0007: 1
TOT-0009: 1
TOT-0010: 3
TZAT-0013SLOWVT: 3
TZAT-0018SLOWVT: NEGATIVE
TZON-0003SLOWVT: 300 ms
TZON-0005SLOWVT: 6
TZST-0001SLOWVT: 2
TZST-0001SLOWVT: 5
TZST-0003SLOWVT: 890 V

## 2012-07-14 NOTE — Progress Notes (Signed)
ICD check 

## 2012-07-29 ENCOUNTER — Encounter: Payer: Self-pay | Admitting: Critical Care Medicine

## 2012-07-29 ENCOUNTER — Ambulatory Visit (INDEPENDENT_AMBULATORY_CARE_PROVIDER_SITE_OTHER): Payer: BC Managed Care – PPO | Admitting: Critical Care Medicine

## 2012-07-29 VITALS — BP 136/84 | HR 62 | Temp 98.3°F | Ht 73.0 in | Wt 207.8 lb

## 2012-07-29 DIAGNOSIS — Z23 Encounter for immunization: Secondary | ICD-10-CM

## 2012-07-29 DIAGNOSIS — J449 Chronic obstructive pulmonary disease, unspecified: Secondary | ICD-10-CM

## 2012-07-29 MED ORDER — FLUTICASONE-SALMETEROL 250-50 MCG/DOSE IN AEPB: 1.0000 | INHALATION_SPRAY | Freq: Two times a day (BID) | RESPIRATORY_TRACT | Status: DC

## 2012-07-29 NOTE — Progress Notes (Signed)
Subjective:    Patient ID: Corey Tyler, male    DOB: November 29, 1940, 71 y.o.   MRN: [id number]  HPI  71 y.o.    WM Golds Stage C Copd     07/29/2012 Not seen in 1 yr. No real changes.  No qhs dyspnea. Min dry cough. Dyspnea sl worse. Pt denies any significant sore throat, nasal congestion or excess secretions, fever, chills, sweats, unintended weight loss, pleurtic or exertional chest pain, orthopnea PND, or leg swelling Pt denies any increase in rescue therapy over baseline, denies waking up needing it or having any early am or nocturnal exacerbations of coughing/wheezing/or dyspnea. Pt also denies any obvious fluctuation in symptoms with  weather or environmental change or other alleviating or aggravating factors   Past Medical History  Diagnosis Date  . Old myocardial infarction     Silent MI; Large fixed inferolateral defect consistent with prior infarct; No ischemia; cath in 2000 showing normal coronaries with mildly reduced EF with wall motion abnormality cath without obstruction.  Minimal plaque 2013  . Hypertension   . COPD (chronic obstructive pulmonary disease)   . Hyperlipidemia   . Chronic systolic heart failure     EF down to 20 to 25% per echo November 2012  . Cardiomyopathy - mixed November 2012  . Pulmonary nodule, right     Last scan in 2010 showing stability; felt to be benign.  . Cardiac defibrillator -dual  [facility]   . Atrial tachycardia-non sustained 04/06/2012    [name] DR     Family History  Problem Relation Age of Onset  . Emphysema Mother   . Heart disease Mother     AVR  . Heart failure Father     Died agwe 24     History   Social History  . Marital Status: Married    Spouse Name: N/A    Number of Children: 2  . Years of Education: N/A   Occupational History  . Retired     Airline pilot  . Works [employer]   Social History Main Topics  . Smoking status: Former Smoker -- 2.5 packs/day for 40 years    Types: Cigarettes    Quit date:  11/24/1983  . Smokeless tobacco: Never Used   Comment: 2 1/2 ppd x 40 years  . Alcohol Use: No  . Drug Use: No  . Sexually Active: Not on file   Other Topics Concern  . Not on file   Social History Narrative  . No narrative on file     Allergies  Allergen Reactions  . Atorvastatin Other (See Comments)    cough     Outpatient Prescriptions Prior to Visit  Medication Sig Dispense Refill  . aspirin EC 81 MG tablet Take 81 mg by mouth at bedtime.       . cetirizine (ZYRTEC) 10 MG tablet Take 10 mg by mouth daily as needed. For allergies      . cholecalciferol (VITAMIN D) 1000 UNITS tablet Take 1,000 Units by mouth daily.      . finasteride (PROSCAR) 5 MG tablet Take 5 mg by mouth every morning.      . fluticasone (FLONASE) 50 MCG/ACT nasal spray Place 1 spray into the nose daily as needed. For congestion      . furosemide (LASIX) 40 MG tablet Take 20 mg by mouth daily.       . isosorbide mononitrate (IMDUR) 30 MG 24 hr tablet Take 15 mg by mouth at  bedtime.        Marland Kitchen lisinopril (PRINIVIL,ZESTRIL) 40 MG tablet Take 40 mg by mouth daily.       . metoprolol tartrate (LOPRESSOR) 25 MG tablet Take 25 mg by mouth 2 (two) times daily.      . Multiple Vitamin (MULITIVITAMIN WITH MINERALS) TABS Take 1 tablet by mouth daily.      . nitroGLYCERIN (NITROSTAT) 0.4 MG SL tablet Place 0.4 mg under the tongue every 5 (five) minutes as needed. For chest pain      . Omega-3 Fatty Acids (FISH OIL) 1200 MG CAPS Take 1 capsule by mouth 2 (two) times daily.        . pantoprazole (PROTONIX) 40 MG tablet Take 40 mg by mouth daily.        . psyllium (METAMUCIL) 58.6 % powder Take 1 packet by mouth daily.       . rosuvastatin (CRESTOR) 5 MG tablet Take 5 mg by mouth daily.        . Tamsulosin HCl (FLOMAX) 0.4 MG CAPS Take 0.4 mg by mouth daily.        . Fluticasone-Salmeterol (ADVAIR DISKUS) 250-50 MCG/DOSE AEPB Inhale 1 puff into the lungs 2 (two) times daily.  60 each  2  . Fluticasone-Salmeterol (ADVAIR)  250-50 MCG/DOSE AEPB Inhale 1 puff into the lungs 2 (two) times daily.          Review of Systems  Constitutional:   No  weight loss, night sweats,  Fevers, chills, fatigue, lassitude. HEENT:   No headaches,  Difficulty swallowing,  Tooth/dental problems,  Sore throat,                No sneezing, itching, ear ache, nasal congestion, post nasal drip,   CV:  No chest pain,  Orthopnea, PND, swelling in lower extremities, anasarca, dizziness, palpitations  GI  No heartburn, indigestion, abdominal pain, nausea, vomiting, diarrhea, change in bowel habits, loss of appetite  Resp: Notes  shortness of breath with exertion not  at rest.  No excess mucus, no productive cough,  Notes  non-productive cough,  No coughing up of blood.  No change in color of mucus.  No wheezing.  No chest wall deformity  Skin: no rash or lesions.  GU: no dysuria, change in color of urine, no urgency or frequency.  No flank pain.  MS:  No joint pain or swelling.  No decreased range of motion.  No back pain.  Psych:  No change in mood or affect. No depression or anxiety.  No memory loss.     Objective:   Physical Exam  Filed Vitals:   07/29/12 1157  BP: 136/84  Pulse: 62  Temp: 98.3 F (36.8 C)  TempSrc: Oral  Height: 6\' 1"  (1.854 m)  Weight: 94.257 kg (207 lb 12.8 oz)  SpO2: 95%    Gen: Pleasant, well-nourished, in no distress,  normal affect  ENT: No lesions,  mouth clear,  oropharynx clear, no postnasal drip  Neck: No JVD, no TMG, no carotid bruits  Lungs: No use of accessory muscles, no dullness to percussion, distant BS  Cardiovascular: RRR, heart sounds normal, no murmur or gallops, no peripheral edema  Abdomen: soft and NT, no HSM,  BS normal  Musculoskeletal: No deformities, no cyanosis or clubbing  Neuro: alert, non focal  Skin: Warm, no lesions or rashes      PFT Conversion 02/10/2010  FVC 4.66  FVC PREDICT 4.84  FVC  % Predicted 96  FEV1 2.19  FEV1 PREDICT 3.25  FEV %  Predicted 68  FEV1/FVC 47  FEV1/FVC PRE 68  FeF 25-75 0.41  FeF 25-75 % Predicted 2.84  FEF % EXPEC 14  POST FVC 4.69  FVCPRDPST 4.84  POST FVC%EXP 97  POST FEV1 2.44  FEV1PRDPST 3.25  POSTFEV1%PRD 75  PSTFEV1/FVC 52  FEV1FVCPRDPS 68  PSTFEF25/75% 0.67  PSTFEF25/75P 2.84  FEF2575%EXPS 24  Residual Volume (ml) 3.25  RV % EXPECT 119  DLCO (ml/mmHg sec) 13.5  DLCO % EXPEC 56  DLCO/VA 2.30  DLCO/VA%EXP 65  PFT RSLT moderate obstruction with significant bronchodilator response    Assessment & Plan:   COPD Copd golds C stable Plan Flu vaccine/ pneumovax No change in inhaled or maintenance medications. Return in 54yr    Updated Medication List Outpatient Encounter Prescriptions as of 07/29/2012  Medication Sig Dispense Refill  . aspirin EC 81 MG tablet Take 81 mg by mouth at bedtime.       . cetirizine (ZYRTEC) 10 MG tablet Take 10 mg by mouth daily as needed. For allergies      . cholecalciferol (VITAMIN D) 1000 UNITS tablet Take 1,000 Units by mouth daily.      . finasteride (PROSCAR) 5 MG tablet Take 5 mg by mouth every morning.      . fluticasone (FLONASE) 50 MCG/ACT nasal spray Place 1 spray into the nose daily as needed. For congestion      . Fluticasone-Salmeterol (ADVAIR DISKUS) 250-50 MCG/DOSE AEPB Inhale 1 puff into the lungs 2 (two) times daily.  60 each  11  . furosemide (LASIX) 40 MG tablet Take 20 mg by mouth daily.       . isosorbide mononitrate (IMDUR) 30 MG 24 hr tablet Take 15 mg by mouth at bedtime.        Marland Kitchen lisinopril (PRINIVIL,ZESTRIL) 40 MG tablet Take 40 mg by mouth daily.       . metoprolol tartrate (LOPRESSOR) 25 MG tablet Take 25 mg by mouth 2 (two) times daily.      . Multiple Vitamin (MULITIVITAMIN WITH MINERALS) TABS Take 1 tablet by mouth daily.      . nitroGLYCERIN (NITROSTAT) 0.4 MG SL tablet Place 0.4 mg under the tongue every 5 (five) minutes as needed. For chest pain      . Omega-3 Fatty Acids (FISH OIL) 1200 MG CAPS Take 1 capsule by mouth  2 (two) times daily.        . pantoprazole (PROTONIX) 40 MG tablet Take 40 mg by mouth daily.        . psyllium (METAMUCIL) 58.6 % powder Take 1 packet by mouth daily.       . rosuvastatin (CRESTOR) 5 MG tablet Take 5 mg by mouth daily.        . Tamsulosin HCl (FLOMAX) 0.4 MG CAPS Take 0.4 mg by mouth daily.        Marland Kitchen DISCONTD: Fluticasone-Salmeterol (ADVAIR DISKUS) 250-50 MCG/DOSE AEPB Inhale 1 puff into the lungs 2 (two) times daily.  60 each  2  . DISCONTD: Fluticasone-Salmeterol (ADVAIR) 250-50 MCG/DOSE AEPB Inhale 1 puff into the lungs 2 (two) times daily.

## 2012-07-29 NOTE — Patient Instructions (Addendum)
Flu vaccine and pneumovax No change in medications. Return in           1 year

## 2012-07-29 NOTE — Assessment & Plan Note (Signed)
Copd golds C stable Plan Flu vaccine/ pneumovax No change in inhaled or maintenance medications. Return in 71yr

## 2012-09-27 ENCOUNTER — Encounter: Payer: Self-pay | Admitting: Cardiology

## 2012-10-05 ENCOUNTER — Other Ambulatory Visit: Payer: Self-pay | Admitting: Internal Medicine

## 2012-10-05 DIAGNOSIS — R413 Other amnesia: Secondary | ICD-10-CM

## 2012-10-11 ENCOUNTER — Ambulatory Visit
Admission: RE | Admit: 2012-10-11 | Discharge: 2012-10-11 | Disposition: A | Discharge status: Home / Self Care | Payer: BC Managed Care – PPO | Source: Ambulatory Visit | Attending: Internal Medicine | Admitting: Internal Medicine

## 2012-10-11 ENCOUNTER — Inpatient Hospital Stay: Admission: RE | Admit: 2012-10-11 | Payer: BC Managed Care – PPO | Source: Ambulatory Visit

## 2012-10-11 DIAGNOSIS — R413 Other amnesia: Secondary | ICD-10-CM

## 2012-10-17 ENCOUNTER — Encounter: Payer: BC Managed Care – PPO | Admitting: *Deleted

## 2012-10-19 ENCOUNTER — Encounter: Payer: Self-pay | Admitting: *Deleted

## 2012-10-24 ENCOUNTER — Telehealth: Payer: Self-pay | Admitting: Internal Medicine

## 2012-10-24 NOTE — Telephone Encounter (Signed)
Will forward to the device clinic 

## 2012-10-24 NOTE — Telephone Encounter (Signed)
SPOKE WITH PT  PT STATES SENT IN TRANSMISSION LAST NIGHT  [facility]IA [facility]    EQUIPMENT DID NOT APPEAR TO WORK  RIGHT . PT RECENTLY SWITCHED  PHONE LINE TO A T AND T  U VERSE THIS  IS WIRELESS NOT SURE IF WILL  WORK  WITH  THAT COMPANY SPOKE WITH  KRISTIN  IN  DEVICE CLINIC DOES NOT KNOW  IF THIS PARTICULAR CO  WILL WORK INFORMED PT TO CALL COMPANY AND ASK IF  WILL BE ABLE TO TRANSMIT  FROM HOME ON THIS LINE  ALSO PT AWARE DID NOT RECEIVE   ANY READINGS FROM LAST NIGHT WILL CALL  TOMORROW./CY

## 2012-10-24 NOTE — Telephone Encounter (Signed)
Pt to call tech services for troubleshooting/kwm

## 2012-10-24 NOTE — Telephone Encounter (Signed)
New Problem:    Patient called in wanting to speak with you about his device transmissions.  Please call back.

## 2012-10-25 ENCOUNTER — Encounter: Payer: Self-pay | Admitting: Internal Medicine

## 2012-10-25 ENCOUNTER — Ambulatory Visit (INDEPENDENT_AMBULATORY_CARE_PROVIDER_SITE_OTHER): Payer: BC Managed Care – PPO | Admitting: *Deleted

## 2012-10-25 DIAGNOSIS — Z95810 Presence of automatic (implantable) cardiac defibrillator: Secondary | ICD-10-CM

## 2012-10-25 DIAGNOSIS — I428 Other cardiomyopathies: Secondary | ICD-10-CM

## 2012-10-25 DIAGNOSIS — I5022 Chronic systolic (congestive) heart failure: Secondary | ICD-10-CM

## 2012-10-25 LAB — REMOTE ICD DEVICE
AL THRESHOLD: 0.5 V
ATRIAL PACING ICD: 49 pct
BAMS-0001: 170 {beats}/min
BAMS-0003: 70 {beats}/min
DEV-0020ICD: NEGATIVE
RV LEAD AMPLITUDE: 12 mv
TZAT-0001SLOWVT: 1
TZAT-0004SLOWVT: 8
TZAT-0012SLOWVT: 200 ms
TZAT-0020SLOWVT: 1.5 ms
TZON-0005SLOWVT: 6
TZST-0001SLOWVT: 4
TZST-0003SLOWVT: 800 V
VENTRICULAR PACING ICD: 2.8 pct

## 2012-11-08 ENCOUNTER — Encounter: Payer: Self-pay | Admitting: *Deleted

## 2012-11-09 ENCOUNTER — Other Ambulatory Visit: Payer: Self-pay | Admitting: *Deleted

## 2012-11-09 MED ORDER — FUROSEMIDE 40 MG PO TABS: 40.0000 mg | ORAL_TABLET | Freq: Every day | ORAL | Status: DC

## 2012-12-12 ENCOUNTER — Ambulatory Visit: Payer: BC Managed Care – PPO | Admitting: Cardiology

## 2012-12-12 ENCOUNTER — Ambulatory Visit (INDEPENDENT_AMBULATORY_CARE_PROVIDER_SITE_OTHER): Payer: Managed Care, Other (non HMO) | Admitting: Cardiology

## 2012-12-12 ENCOUNTER — Encounter: Payer: Self-pay | Admitting: Cardiology

## 2012-12-12 VITALS — BP 126/62 | HR 79 | Ht 73.0 in | Wt 206.0 lb

## 2012-12-12 DIAGNOSIS — I498 Other specified cardiac arrhythmias: Secondary | ICD-10-CM

## 2012-12-12 DIAGNOSIS — I5022 Chronic systolic (congestive) heart failure: Secondary | ICD-10-CM

## 2012-12-12 DIAGNOSIS — I471 Supraventricular tachycardia: Secondary | ICD-10-CM

## 2012-12-12 DIAGNOSIS — I428 Other cardiomyopathies: Secondary | ICD-10-CM

## 2012-12-12 DIAGNOSIS — I4949 Other premature depolarization: Secondary | ICD-10-CM

## 2012-12-12 DIAGNOSIS — I10 Essential (primary) hypertension: Secondary | ICD-10-CM

## 2012-12-12 DIAGNOSIS — Z95810 Presence of automatic (implantable) cardiac defibrillator: Secondary | ICD-10-CM

## 2012-12-12 NOTE — Progress Notes (Signed)
HPI The patient presents for evaluation of dyspnea. He reports a past history of a silent myocardial infarction. Echo from 2008 demonstrating an EF of 40-45% with inferior/posterior hypokinesis.  Cardiac catheterization from 2000 demonstrating normal coronary arteries but he did have a mildly reduced ejection fraction with wall motion abnormality. He's had stress perfusion studies at another practice. The last of these was in 2010. There was evidence for an EF of about 35%.  He had increasing dyspnea and had an echo with on EF found to be 20 - 25%.   Followup cath demonstrated only minimal coronary plaque. He subsequently had a defibrillator placed by Dr. Graciela Husbands.  Since he was last seen he had been doing relatively well until about a week ago. He then noticed that he was weak and had some shortness of breath. He presented to [name], MD and apparently had normal labs and chest x-ray. He was given an antibiotic and after only one day did feel better. It was suggested that he might followup with me. He actually has had no obvious new cardiovascular complaints with no new PND or orthopnea. He's not had any palpitations or syncope. He does get dizzy occasionally with some orthostatic symptoms. He might hold his diuretic when he does this. He's not describing any new chest pressure, neck or arm discomfort. He thinks his breathing is better than it was last week. He's not describing any weight gain or edema.   Allergies  Allergen Reactions  . Atorvastatin Other (See Comments)    cough    Current Outpatient Prescriptions  Medication Sig Dispense Refill  . aspirin EC 81 MG tablet Take 81 mg by mouth at bedtime.       . cetirizine (ZYRTEC) 10 MG tablet Take 10 mg by mouth daily as needed. For allergies      . cholecalciferol (VITAMIN D) 1000 UNITS tablet Take 1,000 Units by mouth daily.      . finasteride (PROSCAR) 5 MG tablet Take 5 mg by mouth every morning.      . fluticasone (FLONASE) 50 MCG/ACT  nasal spray Place 1 spray into the nose daily as needed. For congestion      . Fluticasone-Salmeterol (ADVAIR DISKUS) 250-50 MCG/DOSE AEPB Inhale 1 puff into the lungs 2 (two) times daily.  60 each  11  . furosemide (LASIX) 40 MG tablet Take 1 tablet (40 mg total) by mouth daily.  30 tablet  3  . isosorbide mononitrate (IMDUR) 30 MG 24 hr tablet Take 15 mg by mouth at bedtime.        Marland Kitchen lisinopril (PRINIVIL,ZESTRIL) 40 MG tablet Take 40 mg by mouth daily.       . metoprolol tartrate (LOPRESSOR) 25 MG tablet Take 25 mg by mouth 2 (two) times daily.      . Multiple Vitamin (MULITIVITAMIN WITH MINERALS) TABS Take 1 tablet by mouth daily.      . nitroGLYCERIN (NITROSTAT) 0.4 MG SL tablet Place 0.4 mg under the tongue every 5 (five) minutes as needed. For chest pain      . Omega-3 Fatty Acids (FISH OIL) 1200 MG CAPS Take 1 capsule by mouth 2 (two) times daily.        . pantoprazole (PROTONIX) 40 MG tablet Take 40 mg by mouth daily.        . psyllium (METAMUCIL) 58.6 % powder Take 1 packet by mouth daily.       . rosuvastatin (CRESTOR) 5 MG tablet Take 5 mg by mouth  daily.        . Tamsulosin HCl (FLOMAX) 0.4 MG CAPS Take 0.4 mg by mouth daily.          Past Medical History  Diagnosis Date  . Old myocardial infarction     Silent MI; Large fixed inferolateral defect consistent with prior infarct; No ischemia; cath in 2000 showing normal coronaries with mildly reduced EF with wall motion abnormality cath without obstruction.  Minimal plaque 2013  . Hypertension   . COPD (chronic obstructive pulmonary disease)   . Hyperlipidemia   . Chronic systolic heart failure     EF down to 20 to 25% per echo November 2012  . Cardiomyopathy - mixed November 2012  . Pulmonary nodule, right     Last scan in 2010 showing stability; felt to be benign.  . Cardiac defibrillator -dual  [facility]   . Atrial tachycardia-non sustained 04/06/2012    [name] DR    Past Surgical History  Procedure Date  . Colon  surgery   . Cardiac catheterization 2000  . Icd 12/2011    Graciela Husbands    ROS:  As stated in the HPI and negative for all other systems.   PHYSICAL EXAM BP 126/62  Pulse 79  Ht 6\' 1"  (1.854 m)  Wt 206 lb (93.441 kg)  BMI 27.18 kg/m2 GENERAL:  Well appearing HEENT:  Pupils equal round and reactive, fundi not visualized, oral mucosa unremarkable, dentures NECK:  No jugular venous distention, waveform within normal limits, carotid upstroke brisk and symmetric, no bruits, no thyromegaly LYMPHATICS:  No cervical, inguinal adenopathy LUNGS:  Clear to auscultation bilaterally BACK:  No CVA tenderness CHEST:  Well healed ICD pocket.   HEART:  PMI not displaced or sustained,S1 and S2 within normal limits, no S3, no S4, no clicks, no rubs, no murmurs ABD:  Flat, positive bowel sounds normal in frequency in pitch, no bruits, no rebound, no guarding, no midline pulsatile mass, no hepatomegaly, no splenomegaly EXT:  2 plus pulses throughout, no edema, no cyanosis no clubbing  ASSESSMENT AND PLAN  Ischemic cardiomyopathy -  He seems to be euvolemic. I do note that he has been two years since his last echo.  I would like to see whether there is a new baseline ejection fraction and so I will schedule this. I looked back to see why he is on such a low dose of beta blocker and it is clear that he has been more lightheaded on higher doses. Therefore, I cannot titrate this further.  HYPERTENSION -  This is being managed in the context of treating his CHF  Palpitations -  He is not describing new tachycardia palpitations.   CAD - The patient has no new sypmtoms.  No further cardiovascular testing is indicated.  We will continue with aggressive risk reduction and meds as listed.

## 2012-12-12 NOTE — Patient Instructions (Addendum)
The current medical regimen is effective;  continue present plan and medications.  Your physician has requested that you have an echocardiogram. Echocardiography is a painless test that uses sound waves to create images of your heart. It provides your doctor with information about the size and shape of your heart and how well your heart's chambers and valves are working. This procedure takes approximately one hour. There are no restrictions for this procedure.  Follow up in 6 months with Dr Hochrein.  You will receive a letter in the mail 2 months before you are due.  Please call us when you receive this letter to schedule your follow up appointment.  

## 2012-12-13 ENCOUNTER — Ambulatory Visit (HOSPITAL_COMMUNITY): Payer: Managed Care, Other (non HMO) | Attending: Cardiology

## 2012-12-13 DIAGNOSIS — R0609 Other forms of dyspnea: Secondary | ICD-10-CM | POA: Insufficient documentation

## 2012-12-13 DIAGNOSIS — I5022 Chronic systolic (congestive) heart failure: Secondary | ICD-10-CM | POA: Insufficient documentation

## 2012-12-13 DIAGNOSIS — E785 Hyperlipidemia, unspecified: Secondary | ICD-10-CM | POA: Insufficient documentation

## 2012-12-13 DIAGNOSIS — I428 Other cardiomyopathies: Secondary | ICD-10-CM | POA: Insufficient documentation

## 2012-12-13 DIAGNOSIS — I10 Essential (primary) hypertension: Secondary | ICD-10-CM | POA: Insufficient documentation

## 2012-12-13 DIAGNOSIS — R0989 Other specified symptoms and signs involving the circulatory and respiratory systems: Secondary | ICD-10-CM | POA: Insufficient documentation

## 2012-12-13 DIAGNOSIS — I2589 Other forms of chronic ischemic heart disease: Secondary | ICD-10-CM | POA: Insufficient documentation

## 2012-12-13 NOTE — Progress Notes (Signed)
Echocardiogram performed.  

## 2012-12-16 ENCOUNTER — Ambulatory Visit: Payer: BC Managed Care – PPO | Admitting: Cardiology

## 2012-12-22 ENCOUNTER — Other Ambulatory Visit: Payer: Self-pay | Admitting: Nurse Practitioner

## 2012-12-23 ENCOUNTER — Telehealth: Payer: Self-pay | Admitting: *Deleted

## 2012-12-23 NOTE — Telephone Encounter (Signed)
S/w pt wanted to make sure his script was refilled correctly pt stated he is taking 15 mg of imdur daily

## 2012-12-29 ENCOUNTER — Ambulatory Visit (INDEPENDENT_AMBULATORY_CARE_PROVIDER_SITE_OTHER): Payer: Managed Care, Other (non HMO) | Admitting: Internal Medicine

## 2012-12-29 ENCOUNTER — Encounter: Payer: Self-pay | Admitting: Internal Medicine

## 2012-12-29 ENCOUNTER — Ambulatory Visit (INDEPENDENT_AMBULATORY_CARE_PROVIDER_SITE_OTHER): Payer: Managed Care, Other (non HMO) | Admitting: Family Medicine

## 2012-12-29 VITALS — BP 128/72 | HR 53 | Temp 98.5°F | Resp 16 | Ht 73.0 in | Wt 204.6 lb

## 2012-12-29 VITALS — BP 150/88 | HR 67 | Ht 73.0 in | Wt 206.1 lb

## 2012-12-29 DIAGNOSIS — M79605 Pain in left leg: Secondary | ICD-10-CM

## 2012-12-29 DIAGNOSIS — I4949 Other premature depolarization: Secondary | ICD-10-CM

## 2012-12-29 DIAGNOSIS — IMO0002 Reserved for concepts with insufficient information to code with codable children: Secondary | ICD-10-CM

## 2012-12-29 DIAGNOSIS — S76312A Strain of muscle, fascia and tendon of the posterior muscle group at thigh level, left thigh, initial encounter: Secondary | ICD-10-CM

## 2012-12-29 DIAGNOSIS — I480 Paroxysmal atrial fibrillation: Secondary | ICD-10-CM | POA: Insufficient documentation

## 2012-12-29 DIAGNOSIS — M79609 Pain in unspecified limb: Secondary | ICD-10-CM

## 2012-12-29 DIAGNOSIS — I428 Other cardiomyopathies: Secondary | ICD-10-CM

## 2012-12-29 DIAGNOSIS — I5022 Chronic systolic (congestive) heart failure: Secondary | ICD-10-CM

## 2012-12-29 DIAGNOSIS — I10 Essential (primary) hypertension: Secondary | ICD-10-CM

## 2012-12-29 DIAGNOSIS — Z95810 Presence of automatic (implantable) cardiac defibrillator: Secondary | ICD-10-CM

## 2012-12-29 DIAGNOSIS — I4891 Unspecified atrial fibrillation: Secondary | ICD-10-CM

## 2012-12-29 LAB — ICD DEVICE OBSERVATION
AL IMPEDENCE ICD: 437.5 Ohm
ATRIAL PACING ICD: 45 pct
BAMS-0001: 170 {beats}/min
BAMS-0003: 70 {beats}/min
DEV-0020ICD: NEGATIVE
TOT-0006: 20130213000000
TOT-0007: 1
TOT-0008: 0
TZAT-0004SLOWVT: 8
TZAT-0012SLOWVT: 200 ms
TZAT-0018SLOWVT: NEGATIVE
TZAT-0019SLOWVT: 7.5 V
TZON-0003SLOWVT: 300 ms
TZON-0004SLOWVT: 30
TZST-0001SLOWVT: 3
TZST-0001SLOWVT: 5
TZST-0003SLOWVT: 800 V
VENTRICULAR PACING ICD: 2.7 pct
VF: 0

## 2012-12-29 MED ORDER — APIXABAN 5 MG PO TABS: ORAL_TABLET | ORAL | Status: DC

## 2012-12-29 MED ORDER — CARVEDILOL 6.25 MG PO TABS: 6.2500 mg | ORAL_TABLET | Freq: Two times a day (BID) | ORAL | Status: DC

## 2012-12-29 NOTE — Progress Notes (Signed)
Patient Care Team: Massie Maroon, MD as PCP - General (Internal Medicine) Storm Frisk, MD (Pulmonary Disease)   HPI  Corey Tyler is a 72 y.o. male Seen following icd implantation 12/2011 for primary prevention with ischemic and nonischemic cardiomyopathy and bradycardia.     Echo from 2008 demonstrating an EF of 40-45% with inferior/posterior hypokinesis. Cardiac catheterization from 2000 demonstrating normal coronary arteries but he did have a mildly reduced ejection fraction with wall motion abnormality. He's had stress perfusion studies at another practice. The last of these was in 2010. There was evidence for an EF of about 35%. He had increasing dyspnea and had an echo with on EF found to be 20 - 25%. Followup cath demonstrated only minimal coronary plaque. Echocardiogram 1/14 demonstrated progressive left ventricular dysfunction with an EF of 15% and wall motion abnormalities in the inferior wall   He saw Dr. Davonna Belling a couple of weeks ago because of on  problems with dyspnea.  He has not had significant edema. He does have some PND.    Past Medical History  Diagnosis Date  . Old myocardial infarction     Silent MI; Large fixed inferolateral defect consistent with prior infarct; No ischemia; cath in 2000 showing normal coronaries with mildly reduced EF with wall motion abnormality cath without obstruction.  Minimal plaque 2013  . Hypertension   . COPD (chronic obstructive pulmonary disease)   . Hyperlipidemia   . Chronic systolic heart failure     EF down to 20 to 25% per echo November 2012  . Cardiomyopathy - mixed November 2012  . Pulmonary nodule, right     Last scan in 2010 showing stability; felt to be benign.  . Cardiac defibrillator -dual  [facility]   . Atrial tachycardia-non sustained 04/06/2012    [name] DR    Past Surgical History  Procedure Date  . Colon surgery   . Cardiac catheterization 2000  . Icd 12/2011    Graciela Husbands    Current Outpatient  Prescriptions  Medication Sig Dispense Refill  . aspirin EC 81 MG tablet Take 81 mg by mouth at bedtime.       . cetirizine (ZYRTEC) 10 MG tablet Take 10 mg by mouth daily as needed. For allergies      . cholecalciferol (VITAMIN D) 1000 UNITS tablet Take 1,000 Units by mouth daily.      . finasteride (PROSCAR) 5 MG tablet Take 5 mg by mouth every morning.      . fluticasone (FLONASE) 50 MCG/ACT nasal spray Place 1 spray into the nose daily as needed. For congestion      . furosemide (LASIX) 40 MG tablet Take 1 tablet (40 mg total) by mouth daily.  30 tablet  3  . Ipratropium-Albuterol (COMBIVENT RESPIMAT) 20-100 MCG/ACT AERS respimat Inhale 1 puff into the lungs every 6 (six) hours.      . isosorbide mononitrate (IMDUR) 30 MG 24 hr tablet Take 15 mg by mouth at bedtime.        . isosorbide mononitrate (IMDUR) 30 MG 24 hr tablet Take 0.5 tablets (15 mg total) by mouth daily.  30 tablet  6  . lisinopril (PRINIVIL,ZESTRIL) 40 MG tablet Take 40 mg by mouth daily.       . metoprolol tartrate (LOPRESSOR) 25 MG tablet Take 25 mg by mouth 2 (two) times daily.      . Multiple Vitamin (MULITIVITAMIN WITH MINERALS) TABS Take 1 tablet by mouth daily.      Marland Kitchen  nitroGLYCERIN (NITROSTAT) 0.4 MG SL tablet Place 0.4 mg under the tongue every 5 (five) minutes as needed. For chest pain      . Omega-3 Fatty Acids (FISH OIL) 1200 MG CAPS Take 1 capsule by mouth 2 (two) times daily.        . pantoprazole (PROTONIX) 40 MG tablet Take 40 mg by mouth daily.        . psyllium (METAMUCIL) 58.6 % powder Take 1 packet by mouth daily.       . rosuvastatin (CRESTOR) 5 MG tablet Take 5 mg by mouth daily.        . Tamsulosin HCl (FLOMAX) 0.4 MG CAPS Take 0.4 mg by mouth daily.          Allergies  Allergen Reactions  . Atorvastatin Other (See Comments)    cough    Review of Systems negative except from HPI and PMH  Physical Exam BP 150/88  Pulse 67  Ht 6\' 1"  (1.854 m)  Wt 206 lb 1.9 oz (93.495 kg)  BMI 27.19  kg/m2 Well developed and well nourished in no acute distress HENT normal E scleral and icterus clear Neck Supple JVP flat; carotids brisk and full Clear to ausculation  Regular rate and rhythm, no murmurs gallops or rub Soft with active bowel sounds No clubbing cyanosis none Edema Alert and oriented, grossly normal motor and sensory function Skin Warm and Dry  ECG reviewed from January 2014 demonstrated there are a QRS  Assessment and  Plan

## 2012-12-29 NOTE — Patient Instructions (Addendum)
1. Leg pain, left   2. Left hamstring muscle strain    Hamstring Strain with Rehab The hamstring muscle and tendons are vulnerable to muscle or tendon tear (strain). Hamstring tears cause pain and inflammation in the backside of the thigh, where the hamstring muscles are located. The hamstring is comprised of three muscles that are responsible for straightening the hip, bending the knee, and stabilizing the knee. These muscles are important for walking, running, and jumping. Hamstring strain is the most common injury of the thigh. Hamstring strains are classified as grade 1 or 2 strains. Grade 1 strains cause pain, but the tendon is not lengthened. Grade 2 strains include a lengthened ligament due to the ligament being stretched or partially ruptured. With grade 2 strains there is still function, although the function may be decreased.  SYMPTOMS   Pain, tenderness, swelling, warmth, or redness over the hamstring muscles, at the back of the thigh.  Pain that gets worse during and after intense activity.  A "pop" heard in the area, at the time of injury.  Muscle spasm in the hamstring muscles.  Pain or weakness with running, jumping, or bending the knee against resistance.  Crackling sound (crepitation) when the tendon is moved or touched.  Bruising (contusion) in the thigh within 48 hours of injury.  Loss of fullness of the muscle, or area of muscle bulging in the case of a complete rupture. CAUSES  A muscle strain occurs when a force is placed on the muscle or tendon that is greater than it can withstand. Common causes of injury include:  Strain from overuse or sudden increase in the frequency, duration, or intensity of activity.  Single violent blow or force to the back of the knee or the hamstring area of the thigh. RISK INCREASES WITH:  Sports that require quick starts (sprinting, racquetball, tennis).  Sports that require jumping (basketball, volleyball).  Kicking sports and  water skiing.  Contact sports (soccer, football).  Poor strength and flexibility.  Failure to warm up properly before activity.  Previous thigh, knee, or pelvis injury.  Poor exercise technique.  Poor posture. PREVENTION  Maintain physical fitness:  Strength, flexibility, and endurance.  Cardiovascular fitness.  Learn and use proper exercise technique and posture.  Wear proper fitted and padded protective equipment. PROGNOSIS  If treated properly, hamstring strains are usually curable in 2 to 6 weeks. RELATED COMPLICATIONS   Longer healing time, if not properly treated or if not given adequate time to heal.  Chronically inflamed tendon, causing persistent pain with activity that may progress to constant pain.  Recurring symptoms, if activity is resumed too soon.  Vulnerable to repeated injury (in up to 25% of cases). TREATMENT  Treatment first involves the use of ice and medication to help reduce pain and inflammation. It is also important to complete strengthening and stretching exercises, as well as modifying any activities that aggravate the symptoms. These exercises may be completed at home or with a therapist. Your caregiver may recommend the use of crutches to help reduce pain and discomfort, especially is the strain is severe enough to cause limping. If the tendon has pulled away from the bone, then surgery is necessary to reattach it. MEDICATION   If pain medicine is needed, nonsteroidal anti-inflammatory medicines (aspirin and ibuprofen), or other minor pain relievers (acetaminophen), are often advised.  Do not take pain medicine for 7 days before surgery.  Prescription pain relievers may be given if your caregiver thinks they are needed. Use only  as directed and only as much as you need.  Corticosteroid injections may be recommended. However, these injections should only be used for serious cases, as they can only be given a certain number of times.  Ointments  applied to the skin may be beneficial. HEAT AND COLD  Cold treatment (icing) relieves pain and reduces inflammation. Cold treatment should be applied for 10 to 15 minutes every 2 to 3 hours, and immediately after activity that aggravates your symptoms. Use ice packs or an ice massage.  Heat treatment may be used before performing the stretching and strengthening activities prescribed by your caregiver, physical therapist, or athletic trainer. Use a heat pack or a warm water soak. SEEK MEDICAL CARE IF:   Symptoms get worse or do not improve in 2 weeks, despite treatment.  New, unexplained symptoms develop. (Drugs used in treatment may produce side effects.) EXERCISES RANGE OF MOTION (ROM) AND STRETCHING EXERCISES - Hamstring Strain These exercises may help you when beginning to rehabilitate your injury. Your symptoms may go away with or without further involvement from your physician, physical therapist or athletic trainer. While completing these exercises, remember:   Restoring tissue flexibility helps normal motion to return to the joints. This allows healthier, less painful movement and activity.  An effective stretch should be held for at least 30 seconds.  A stretch should never be painful. You should only feel a gentle lengthening or release in the stretched tissue. STRETCH - Hamstrings, Standing  Stand or sit, and extend your right / left leg, placing your foot on a chair or foot stool.  Keep a slight arch in your low back and your hips straight forward.  Lead with your chest, and lean forward at the waist until you feel a gentle stretch in the back of your right / left knee or thigh. (When done correctly, this exercise requires leaning only a small distance.)  Hold this position for __________ seconds. Repeat __________ times. Complete this stretch __________ times per day. STRETCH  Hamstrings, Supine   Lie on your back. Loop a belt or towel over the ball of your right / left  foot.  Straighten your right / left knee and slowly pull on the belt to raise your leg. Do not allow the right / left knee to bend. Keep your opposite leg flat on the floor.  Raise the leg until you feel a gentle stretch behind your right / left knee or thigh. Hold this position for __________ seconds. Repeat __________ times. Complete this stretch __________ times per day.  STRETCH - Hamstrings, Doorway  Lie on your back with your right / left leg extended and resting on the wall, and the opposite leg flat on the ground through the door. Initially, position your bottom farther away from the wall.  Keep your right / left knee straight. If you feel a stretch behind your knee or thigh, hold this position for __________ seconds.  If you do not feel a stretch, scoot your bottom closer to the door and hold __________ seconds. Repeat __________ times. Complete this stretch __________ times per day.  STRETCH - Hamstrings/Adductors, V-Sit   Sit on the floor with your legs extended in a large "V," keeping your knees straight.  With your head and chest upright, bend at your waist reaching for your left foot to stretch your right thigh muscles.  You should feel a stretch in your right inner thigh. Hold for __________ seconds.  Return to the upright position to relax your  leg muscles.  Continuing to keep your chest upright, bend straight forward at your waist to stretch your hamstrings.  You should feel a stretch behind both of your thighs and knees. Hold for __________ seconds.  Return to the upright position to relax your leg muscles.  With your head and chest upright, bend at your waist reaching for your right foot to stretch your left thigh muscles.  You should feel a stretch in your left inner thigh. Hold for __________ seconds.  Return to the upright position to relax your leg muscles. Repeat __________ times. Complete this exercise __________ times per day.  STRENGTHENING EXERCISES -  Hamstring Strain These exercises may help you when beginning to rehabilitate your injury. They may resolve your symptoms with or without further involvement from your physician, physical therapist or athletic trainer. While completing these exercises, remember:   Muscles can gain both the endurance and the strength needed for everyday activities through controlled exercises.  Complete these exercises as instructed by your physician, physical therapist or athletic trainer. Increase the resistance and repetitions only as guided.  You may experience muscle soreness or fatigue, but the pain or discomfort you are trying to eliminate should never get worse during these exercises. If this pain does get worse, stop and make certain you are following the directions exactly. If the pain is still present after adjustments, discontinue the exercise until you can discuss the trouble with your clinician. STRENGTH - Hip Extensors, Straight Leg Raises   Lie on your stomach on a firm surface.  Tense the muscles in your buttocks to lift your right / left leg about 4 inches. If you cannot lift your leg this high without arching your back, place a pillow under your hips.  Keep your knee straight. Hold for __________ seconds.  Slowly lower your leg to the starting position and allow it to relax completely before starting the next repetition. Repeat __________ times. Complete this exercise __________ times per day.  STRENGTH - Hamstring, Isometrics   Lie on your back on a firm surface.  Bend your right / left knee approximately __________ degrees.  Dig your heel into the surface, as if you are trying to pull it toward your buttocks. Tighten the muscles in the back of your thighs to "dig" as hard as you can, without increasing any pain.  Hold this position for __________ seconds.  Release the tension gradually and allow your muscles to completely relax for __________ seconds between each exercise. Repeat  __________ times. Complete this exercise __________ times per day.  STRENGTH - Hamstring, Curls   Lay on your stomach with your legs extended. (If you lay on a bed, your feet may hang over the edge.)  Tighten the muscles in the back of your thigh to bend your right / left knee up to 90 degrees. Keep your hips flat on the bed or floor.  Hold this position for __________ seconds.  Slowly lower your leg back to the starting position. Repeat __________ times. Complete this exercise __________ times per day.  OPTIONAL ANKLE WEIGHTS: Begin with ____________________, but DO NOT exceed ____________________. Increase in 1 pound/0.5 kilogram increments. Document Released: 11/09/2005 Document Revised: 02/01/2012 Document Reviewed: 02/21/2009 [facility] Patient Information 2013 [place], [place].

## 2012-12-29 NOTE — Assessment & Plan Note (Signed)
The patient has newly identified atrial fibrillation. His normal risk profile is elevated hypertension, age, heart failure and vascular disease. We have reviewed risks and benefits and I have discussed with Dr. Davonna Belling. Stop his aspirin and begin him on apixaban.

## 2012-12-29 NOTE — Patient Instructions (Addendum)
Your physician has recommended you make the following change in your medication:  1) Increase imdur (isosorbide) to 30 mg one whole tablet at night. 2) Stop aspirin. 3) Start Eliquis 5 mg by mouth twice daily. 4) Stop metoprolol 5) Start carvedilol (coreg) 6.25 mg one tablet by mouth twice daily.  Remote monitoring is used to monitor your Pacemaker of ICD from home. This monitoring reduces the number of office visits required to check your device to one time per year. It allows [place] to keep an eye on the functioning of your device to ensure it is working properly. You are scheduled for a device check from home on 03/27/13. You may send your transmission at any time that day. If you have a wireless device, the transmission will be sent automatically. After your physician reviews your transmission, you will receive a postcard with your next transmission date.   Your physician wants you to follow-up in: 1 year with Dr. Graciela Husbands. You will receive a reminder letter in the mail two months in advance. If you don't receive a letter, please call our office to schedule the follow-up appointment.

## 2012-12-29 NOTE — Assessment & Plan Note (Signed)
As above.

## 2012-12-29 NOTE — Progress Notes (Signed)
Subjective:    Patient ID: Corey Tyler, male    DOB: 1941-11-06, 72 y.o.   MRN: [id number]  HPIThis 72 y.o. male presents for evaluation of L leg pain. Works at [employer]. 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  Legs really started hurting afterwards.  No back pain.  L posterior buttocks down into calf.   No n/t or burning.  Only pain.  +weakness in L leg with ambulation.  No b/b dysfunction.  No saddle paresthesias.  S/p cardiology evaluation today; placed on blood thinner but cannot get medication until tomorrow; felt like to be on one.  Pacemaker and defibrillator for CHF moderately to severe.  Worried about blood clot/DVT.  Feels swollen in L upper thigh.  Increase in swelling in leg than in R leg.  No prolonged trips; no recent surgeries; no sedentary lifestyle.  No history of DVT; no family history of blood clots.  History of varicose veins.  Squatting makes pain worse.     Review of Systems  Constitutional: Negative for fever, chills, diaphoresis and fatigue.  Respiratory: Negative for shortness of breath.   Cardiovascular: Positive for leg swelling. Negative for chest pain.  Musculoskeletal: Positive for myalgias and gait problem. Negative for back pain, joint swelling and arthralgias.  Skin: Negative for rash.  Neurological: Positive for weakness. Negative for numbness.        Past Medical History  Diagnosis Date  . Old myocardial infarction     Silent MI; Large fixed inferolateral defect consistent with prior infarct; No ischemia; cath in 2000 showing normal coronaries with mildly reduced EF with wall motion abnormality cath without obstruction.  Minimal plaque 2013  . Hypertension   . COPD (chronic obstructive pulmonary disease)   . Hyperlipidemia   . Chronic systolic heart failure     EF down to 20 to 25% per echo November 2012  . Cardiomyopathy - mixed November 2012  . Pulmonary nodule, right     Last scan in 2010 showing stability; felt to be  benign.  . Cardiac defibrillator -dual  [facility]   . Atrial tachycardia-non sustained 04/06/2012    [name] DR    Past Surgical History  Procedure Date  . Colon surgery   . Cardiac catheterization 2000  . Icd 12/2011    Graciela Husbands    Prior to Admission medications   Medication Sig Start Date End Date Taking? Authorizing Provider  apixaban (ELIQUIS) 5 MG TABS tablet Take one tablet by mouth twice daily 12/29/12  Yes Duke Salvia, MD  carvedilol (COREG) 6.25 MG tablet Take 1 tablet (6.25 mg total) by mouth 2 (two) times daily with a meal. 12/29/12  Yes Duke Salvia, MD  cholecalciferol (VITAMIN D) 1000 UNITS tablet Take 1,000 Units by mouth daily.   Yes Historical Provider, MD  finasteride (PROSCAR) 5 MG tablet Take 5 mg by mouth every morning.   Yes Historical Provider, MD  fluticasone (FLONASE) 50 MCG/ACT nasal spray Place 1 spray into the nose daily as needed. For congestion   Yes Historical Provider, MD  furosemide (LASIX) 40 MG tablet Take 1 tablet (40 mg total) by mouth daily. 11/09/12 11/09/13 Yes [name], MD  Ipratropium-Albuterol (COMBIVENT RESPIMAT) 20-100 MCG/ACT AERS respimat Inhale 1 puff into the lungs every 6 (six) hours.   Yes Historical Provider, MD  isosorbide mononitrate (IMDUR) 30 MG 24 hr tablet Take one tablet by mouth daily at bedtime 12/29/12  Yes Salvatore Decent  Graciela Husbands, MD  lisinopril (PRINIVIL,ZESTRIL) 40 MG tablet Take 40 mg by mouth daily.    Yes Historical Provider, MD  Multiple Vitamin (MULITIVITAMIN WITH MINERALS) TABS Take 1 tablet by mouth daily.   Yes Historical Provider, MD  nitroGLYCERIN (NITROSTAT) 0.4 MG SL tablet Place 0.4 mg under the tongue every 5 (five) minutes as needed. For chest pain 11/12/11  Yes Rosalio Macadamia, NP  Omega-3 Fatty Acids (FISH OIL) 1200 MG CAPS Take 1 capsule by mouth 2 (two) times daily.     Yes Historical Provider, MD  pantoprazole (PROTONIX) 40 MG tablet Take 40 mg by mouth daily.     Yes Historical Provider, MD  psyllium  (METAMUCIL) 58.6 % powder Take 1 packet by mouth daily.    Yes Historical Provider, MD  rosuvastatin (CRESTOR) 5 MG tablet Take 5 mg by mouth daily.     Yes Historical Provider, MD  Tamsulosin HCl (FLOMAX) 0.4 MG CAPS Take 0.4 mg by mouth daily.     Yes Historical Provider, MD    Allergies  Allergen Reactions  . Atorvastatin Other (See Comments)    cough    History   Social History  . Marital Status: Married    Spouse Name: N/A    Number of Children: 2  . Years of Education: N/A   Occupational History  . Retired     Airline pilot  . Works [employer]   Social History Main Topics  . Smoking status: Former Smoker -- 2.5 packs/day for 40 years    Types: Cigarettes    Quit date: 11/24/1983  . Smokeless tobacco: Never Used     Comment: 2 1/2 ppd x 40 years  . Alcohol Use: No  . Drug Use: No  . Sexually Active: Not on file   Other Topics Concern  . Not on file   Social History Narrative  . No narrative on file    Family History  Problem Relation Age of Onset  . Emphysema Mother   . Heart disease Mother     AVR  . Heart failure Father     Died agwe 59    Objective:   Physical Exam  Nursing note and vitals reviewed. Constitutional: He appears well-developed and well-nourished. No distress.  Cardiovascular: Intact distal pulses.   Pulses:      Dorsalis pedis pulses are 2+ on the right side, and 2+ on the left side.       Trace pitting edema L>R.  Capillary refill < 3 seconds BLE.  Hommen's negative BLE.  +superficial varicosities BLE L>R without tenderness, warmth.  No palpable cords BLE.  Musculoskeletal:       Left hip: Normal. He exhibits normal range of motion, normal strength, no tenderness, no bony tenderness and no swelling.       Lumbar back: He exhibits normal range of motion, no tenderness, no bony tenderness, no swelling and no spasm.       Left upper leg: He exhibits tenderness. He exhibits no bony tenderness, no swelling, no edema and no laceration.       L  LEG:  +TTP ALONG L HAMSTRING DISTALLY; PAIN WITH RESISTANCE OF HAMSTRING; NORMAL L KNEE; NORMAL L ANKLE AND FOOT.  Skin: He is not diaphoretic.      Assessment & Plan:   1. Leg pain, left   2. Left hamstring muscle strain     1. L Leg Pain: New.  Onset after repetitive squatting at work; recommend Advil or Tylenol PRN. 2.  L hamstring strain:  New. Onset after overuse at work with repetitive squatting; continue Advil or Tylenol.  Hamstring home exercises provided to perform daily; light duty note work provided for next two weeks; to avoid repetitive bending, twisting, rotating, lifting.  Heat to hamstring PRN.  No evidence of DVT; reassurance provided.

## 2012-12-29 NOTE — Assessment & Plan Note (Signed)
Heart failure with orthopnea/nocturnal dyspnea remains an issue. Functionally he is about class IIb. We'll change his isosorbide from 15-30 mg at night.  I've also reviewed with Dr. Davonna Belling; we will discontinue his metoprolol and put him on carvedilol 6.25 twice daily. This may also help with his elevated blood pressure

## 2012-12-29 NOTE — Assessment & Plan Note (Signed)
The patient's device was interrogated.  The information was reviewed. No changes were made in the programming.    

## 2013-01-11 ENCOUNTER — Ambulatory Visit (INDEPENDENT_AMBULATORY_CARE_PROVIDER_SITE_OTHER): Payer: Managed Care, Other (non HMO) | Admitting: Physician Assistant

## 2013-01-11 ENCOUNTER — Encounter: Payer: Self-pay | Admitting: Physician Assistant

## 2013-01-11 VITALS — BP 122/90 | Ht 73.0 in | Wt 202.4 lb

## 2013-01-11 DIAGNOSIS — R42 Dizziness and giddiness: Secondary | ICD-10-CM | POA: Insufficient documentation

## 2013-01-11 MED ORDER — ISOSORBIDE MONONITRATE 15 MG HALF TABLET: ORAL_TABLET | ORAL | Status: DC

## 2013-01-11 MED ORDER — ISOSORBIDE MONONITRATE ER 30 MG PO TB24: 15.0000 mg | ORAL_TABLET | Freq: Every day | ORAL | Status: DC

## 2013-01-11 NOTE — Addendum Note (Signed)
Addended by: Scherrie Bateman E on: 01/11/2013 11:08 AM   Modules accepted: Orders

## 2013-01-11 NOTE — Progress Notes (Signed)
HPI:  This is a 72 year old male patient of Dr. Antoine Poche, and Graciela Husbands with an ischemic and nonischemic cardiomyopathy and bradycardia. He is status post ICD implantation 2013 for primary prevention. His last echocardiogram in 1/14 demonstrated progressive LV dysfunction ejection fraction 15% with wall motion abnormalities in the inferior wall. He has had trouble with ongoing dyspnea over the past few weeks. He saw Dr. Graciela Husbands December 29 2012 at which time he had new atrial fibrillation. He was started on Coreg and the apixaban. His aspirin was stopped. His isosorbide was also increased to 30 mg at night.  Cardiac catheterization 2000 and demonstrated normal coronary arteries but mildly reduced LV function.  The patient says he feels extremely fatigued since starting these new medications he works full-time at [employer] and when he bent over to pick something up he became extremely dizzy and nearly passed out. He became concerned and made the appointment for today. He also states that his shortness of breath improved since starting the Coreg.He watches his salt closely and denies any edema or fluid build up. He has had no further dizziness. He states he can walk up to 8 miles a day working at [employer]. He wants to continue this until September when his wife came to get Medicare.  Allergies:  -- Atorvastatin -- Other (See Comments)   --  cough  Current Outpatient Prescriptions on File Prior to Visit: apixaban (ELIQUIS) 5 MG TABS tablet, Take one tablet by mouth twice daily, Disp: 60 tablet, Rfl: 11 carvedilol (COREG) 6.25 MG tablet, Take 1 tablet (6.25 mg total) by mouth 2 (two) times daily with a meal., Disp: 60 tablet, Rfl: 11 cholecalciferol (VITAMIN D) 1000 UNITS tablet, Take 1,000 Units by mouth daily., Disp: , Rfl:  finasteride (PROSCAR) 5 MG tablet, Take 5 mg by mouth every morning., Disp: , Rfl:  fluticasone (FLONASE) 50 MCG/ACT nasal spray, Place 1 spray into the nose daily as needed. For  congestion, Disp: , Rfl:  furosemide (LASIX) 40 MG tablet, Take 1 tablet (40 mg total) by mouth daily., Disp: 30 tablet, Rfl: 3 Ipratropium-Albuterol (COMBIVENT RESPIMAT) 20-100 MCG/ACT AERS respimat, Inhale 1 puff into the lungs every 6 (six) hours., Disp: , Rfl:  isosorbide mononitrate (IMDUR) 30 MG 24 hr tablet, Take one tablet by mouth daily at bedtime, Disp: , Rfl:  lisinopril (PRINIVIL,ZESTRIL) 40 MG tablet, Take 40 mg by mouth daily. , Disp: , Rfl:  Multiple Vitamin (MULITIVITAMIN WITH MINERALS) TABS, Take 1 tablet by mouth daily., Disp: , Rfl:  nitroGLYCERIN (NITROSTAT) 0.4 MG SL tablet, Place 0.4 mg under the tongue every 5 (five) minutes as needed. For chest pain, Disp: , Rfl:  Omega-3 Fatty Acids (FISH OIL) 1200 MG CAPS, Take 1 capsule by mouth 2 (two) times daily.  , Disp: , Rfl:  pantoprazole (PROTONIX) 40 MG tablet, Take 40 mg by mouth daily.  , Disp: , Rfl:  psyllium (METAMUCIL) 58.6 % powder, Take 1 packet by mouth daily. , Disp: , Rfl:  rosuvastatin (CRESTOR) 5 MG tablet, Take 5 mg by mouth daily.  , Disp: , Rfl:  Tamsulosin HCl (FLOMAX) 0.4 MG CAPS, Take 0.4 mg by mouth daily.  , Disp: , Rfl:   No current facility-administered medications on file prior to visit.   Past Medical History:   Old myocardial infarction  Comment:Silent MI; Large fixed inferolateral defect               consistent with prior infarct; No ischemia;               cath in 2000 showing normal coronaries with               mildly reduced EF with wall motion abnormality               cath without obstruction.  Minimal plaque 2013   Hypertension                                                 COPD (chronic obstructive pulmonary disease)                 Hyperlipidemia                                               Chronic systolic heart failure                                 Comment:EF down to 20 to 25% per echo November 2012   Cardiomyopathy - mixed                           November *   Pulmonary nodule, right                                        Comment:Last scan in 2010 showing stability; felt to be              benign.   Cardiac defibrillator -dual  [facility]                        Atrial tachycardia-non sustained                04/06/2012      Comment:[name] Fortify DR  Past Surgical History:   COLON SURGERY                                                 CARDIAC CATHETERIZATION                          2000         ICD                                              12/2011         Comment:[facility]  Review of patient's family history indicates:   Emphysema                      Mother  Heart disease                  Mother                     Comment: AVR   Heart failure                  Father                     Comment: Died agwe 72   Social History   Marital Status: Married             Spouse Name:                      Years of Education:                 Number of children: 2           Occupational History Occupation          Psychiatric nurse              Retired                                 Airline pilot [employer]            Social History Main Topics   Smoking Status: Former Smoker                   Packs/Day: 2.50  Years: 40        Types: Cigarettes     Quit date: 11/24/1983   Smokeless Status: Never Used                       Comment: 2 1/2 ppd x 40 years   Alcohol Use: No             Drug Use: No             Sexual Activity: Not on file        Other Topics            Concern   None on file  Social History Narrative   None on file    ROS:see history of present illness otherwise negative   PHYSICAL EXAM: Well-nournished, in no acute distress. Neck: No JVD, HJR, Bruit, or thyroid enlargement  Lungs:Decreased breath sounds throughout, No tachypnea, clear without wheezing, rales, or rhonchi  Cardiovascular: RRR, 2/6 diastolic murmur at the left sternal border, and 2/6 systolic  murmur at the apex, no gallops, bruit, thrill, or heave.  Abdomen: BS normal. Soft without organomegaly, masses, lesions or tenderness.  Extremities: without cyanosis, clubbing or edema. Good distal pulses bilateral  SKin: Warm, no lesions or rashes   Musculoskeletal: No deformities  Neuro: no focal signs  BP 122/90  Ht 6\' 1"  (1.854 m)  Wt 202 lb 6.4 oz (91.808 kg)  BMI 26.71 kg/m2   ZOX:WRUEAV sinus rhythm nonspecific ST-T wave changes  2Decho 11/2012 Study Conclusions  - Left ventricle: The cavity size was mildly dilated. Wall   thickness was normal. The estimated ejection fraction was   15%. Diffuse hypokinesis. There is akinesis of the   inferoposterior myocardium. Doppler parameters are  consistent with abnormal left ventricular relaxation   (grade 1 diastolic dysfunction). - Aortic valve: Mild regurgitation. - Mitral valve: Moderate regurgitation. - Left atrium: The atrium was mildly dilated. - Right ventricle: The cavity size was mildly dilated. - Right atrium: The atrium was mildly dilated.

## 2013-01-11 NOTE — Assessment & Plan Note (Signed)
No evidence of heart failure on exam today. Latest ejection fraction is 15%. I'm sure this is contributing to his fatigue

## 2013-01-11 NOTE — Patient Instructions (Addendum)
Your physician recommends that you schedule a follow-up appointment in: 2 WEEKS WITH  DR [facility]  OR [place] Your physician has recommended you make the following change in your medication: DECREASE  ISOSORBIDE  30 MG TO 1/2 TAB AT BEDTIME

## 2013-01-11 NOTE — Assessment & Plan Note (Signed)
Patient has no atrial fibrillation started on Coumadin 2 weeks ago. He is in normal sinus rhythm today.

## 2013-01-11 NOTE — Assessment & Plan Note (Addendum)
Patient had episode of dizziness when he bent over at work to pick something up. I suspect he is overmedicated. I will decrease his Imdur were to 15 mg q.h.s. He says he's had trouble taking the 30 mg dose in the past. I will not increase his Coreg today because of his dizziness.I asked him to call if he has any further dizziness.

## 2013-01-27 ENCOUNTER — Ambulatory Visit: Payer: Managed Care, Other (non HMO) | Admitting: Cardiology

## 2013-02-16 ENCOUNTER — Other Ambulatory Visit: Payer: Self-pay | Admitting: Internal Medicine

## 2013-02-17 ENCOUNTER — Telehealth: Payer: Self-pay | Admitting: *Deleted

## 2013-02-17 NOTE — Telephone Encounter (Signed)
Pharmacy calling to verify Eliquis 5mg  refill. States hard copy shows #[phone] refills when our records show #[phone]. Pharmacy associate corrected this in her computer and has refilled the rx.    Micki Riley, CMA

## 2013-02-27 ENCOUNTER — Encounter: Payer: Self-pay | Admitting: Cardiology

## 2013-02-27 ENCOUNTER — Ambulatory Visit (INDEPENDENT_AMBULATORY_CARE_PROVIDER_SITE_OTHER): Payer: Managed Care, Other (non HMO) | Admitting: Cardiology

## 2013-02-27 VITALS — BP 116/66 | HR 60 | Ht 73.0 in | Wt 209.4 lb

## 2013-02-27 DIAGNOSIS — I428 Other cardiomyopathies: Secondary | ICD-10-CM

## 2013-02-27 DIAGNOSIS — I4891 Unspecified atrial fibrillation: Secondary | ICD-10-CM

## 2013-02-27 DIAGNOSIS — R42 Dizziness and giddiness: Secondary | ICD-10-CM

## 2013-02-27 DIAGNOSIS — R5381 Other malaise: Secondary | ICD-10-CM

## 2013-02-27 DIAGNOSIS — I4949 Other premature depolarization: Secondary | ICD-10-CM

## 2013-02-27 DIAGNOSIS — I5022 Chronic systolic (congestive) heart failure: Secondary | ICD-10-CM

## 2013-02-27 DIAGNOSIS — R5383 Other fatigue: Secondary | ICD-10-CM

## 2013-02-27 MED ORDER — CARVEDILOL 3.125 MG PO TABS: 3.1250 mg | ORAL_TABLET | Freq: Two times a day (BID) | ORAL | Status: DC

## 2013-02-27 NOTE — Patient Instructions (Addendum)
Please increase your Carvedilol to 9.375 mg one twice a day. Take one 6.25 and one 3.125 mg twice a day.  Take 40 mg of Furosemide for 2 days.  Please have blood work today.  Follow up with Dr Antoine Poche in 1 month.

## 2013-02-27 NOTE — Progress Notes (Signed)
HPI The patient presents for evaluation of dyspnea. He reports a past history of a silent myocardial infarction. Echo from 2008 demonstrating an EF of 40-45% with inferior/posterior hypokinesis.  Cardiac catheterization from 2000 demonstrating normal coronary arteries but he did have a mildly reduced ejection fraction with wall motion abnormality. He's had stress perfusion studies at another practice. The last of these was in 2010. There was evidence for an EF of about 35%.  He had increasing dyspnea and had an echo with on EF found to be 20 - 25%.   Followup cath demonstrated only minimal coronary plaque. He subsequently had a defibrillator placed by Dr. Graciela Husbands. Repeat 2-d echo on 12-13-12 showed worsened EF to 15% as well as grade I diastolic dysfunction, mild aortic regurgitation and moderate mitral valve regurgitation.   He was last seen in clinic by Mrs. Noland Fordyce PAc.  He had been having episodes of dizziness with bending over. His Imdur dose was reduced to 15mg . Patient reports he was already taking 15mg  at that time he reports and he stopped taking the medication completely at that time. He states he stopped it due to feelings of shortness of breath (up to 20x a day) which have now improved to typically 2-3x day. SOB comes on suddenly at times even at rest but always gets it when exerting himself (such as picking up something heavy or going up a flight of stairs). Despite this reported improvement, patient also notes need for 2 pillows at night up from 1, increase in his weight, several nights during this time where he couldn't sleep due to shortness of breath, and slight increased edema.  Still working at [employer] 40 hours a week. No dizziness or chest pain. Main concern is something to help his energy level (bought some testosterone but hasnt started taking). Taking 1/2 dose of lasix. No lightheadedness/dizziness/chest pain. Occasional palpitations that resolve with coughing.    Allergies  Allergen  Reactions  . Atorvastatin Other (See Comments)    cough    Current Outpatient Prescriptions  Medication Sig Dispense Refill  . apixaban (ELIQUIS) 5 MG TABS tablet Take one tablet by mouth twice daily  60 tablet  11  . carvedilol (COREG) 6.25 MG tablet Take 1 tablet (6.25 mg total) by mouth 2 (two) times daily with a meal.  60 tablet  11  . cholecalciferol (VITAMIN D) 1000 UNITS tablet Take 1,000 Units by mouth daily.      . finasteride (PROSCAR) 5 MG tablet Take 5 mg by mouth every morning.      . fluticasone (FLONASE) 50 MCG/ACT nasal spray Place 1 spray into the nose daily as needed. For congestion      . furosemide (LASIX) 40 MG tablet Take 1 tablet (40 mg total) by mouth daily.  30 tablet  3  . Ipratropium-Albuterol (COMBIVENT RESPIMAT) 20-100 MCG/ACT AERS respimat Inhale 1 puff into the lungs every 6 (six) hours.      . isosorbide mononitrate (IMDUR) 30 MG 24 hr tablet Take 0.5 tablets (15 mg total) by mouth at bedtime.  45 tablet  4  . lisinopril (PRINIVIL,ZESTRIL) 40 MG tablet Take 40 mg by mouth daily.       . Multiple Vitamin (MULITIVITAMIN WITH MINERALS) TABS Take 1 tablet by mouth daily.      . nitroGLYCERIN (NITROSTAT) 0.4 MG SL tablet Place 0.4 mg under the tongue every 5 (five) minutes as needed. For chest pain      . Omega-3 Fatty Acids (FISH OIL)  1200 MG CAPS Take 1 capsule by mouth 2 (two) times daily.        . pantoprazole (PROTONIX) 40 MG tablet Take 40 mg by mouth daily.        . psyllium (METAMUCIL) 58.6 % powder Take 1 packet by mouth daily.       . rosuvastatin (CRESTOR) 5 MG tablet Take 5 mg by mouth daily.        . Tamsulosin HCl (FLOMAX) 0.4 MG CAPS Take 0.4 mg by mouth daily.         No current facility-administered medications for this visit.    Past Medical History  Diagnosis Date  . Old myocardial infarction     Silent MI; Large fixed inferolateral defect consistent with prior infarct; No ischemia; cath in 2000 showing normal coronaries with mildly reduced  EF with wall motion abnormality cath without obstruction.  Minimal plaque 2013  . Hypertension   . COPD (chronic obstructive pulmonary disease)   . Hyperlipidemia   . Chronic systolic heart failure     EF down to 20 to 25% per echo November 2012  . Cardiomyopathy - mixed November 2012  . Pulmonary nodule, right     Last scan in 2010 showing stability; felt to be benign.  . Cardiac defibrillator -dual  [facility]   . Atrial tachycardia-non sustained 04/06/2012    [name] Fortify DR    Past Surgical History  Procedure Laterality Date  . Colon surgery    . Cardiac catheterization  2000  . Icd  12/2011    Graciela Husbands    ROS:  As stated in the HPI and negative for all other systems.   PHYSICAL EXAM BP 116/66  Pulse 60  Ht 6\' 1"  (1.854 m)  Wt 209 lb 6.4 oz (94.983 kg)  BMI 27.63 kg/m2 GENERAL:  Well appearing NECK:  No jugular venous distention but hepatojugular reflux noted, waveform within normal limits, carotid upstroke brisk and symmetric, no bruits, no thyromegaly LUNGS:  Clear to auscultation bilaterally BACK:  No CVA tenderness CHEST:  Well healed ICD pocket.   HEART:  PMI not displaced or sustained,S1 and S2 within normal limits, no S3, no S4, no clicks, no rubs, no murmurs, irregularly irregular rate ABD:  Flat, positive bowel sounds normal in frequency in pitch, no bruits, no rebound, no guarding, no midline pulsatile mass, no hepatomegaly, no splenomegaly EXT:  2 plus pulses throughout, 1+ pitting edema, no cyanosis no clubbing  ASSESSMENT AND PLAN  Ischemic cardiomyopathy - EF 15% in January.  Weight up 7 lbs from February visit with 2 pillow orthopnea. Hepatojugular reflux noted. Appears fluid overloaded. Will increase carvedilol to 9.375mg  BID (patient to follow for side effect of lightheadedness which happened on higher doses previously but now off of imdur). Lasix 40mg  x 2 days then return to 20mg .   Low Energy/fatigue - Likely related to CHF with EF of 15% but will  rule out other causes with TSH, vitamin D, and testosterone levels. Advised patient to wait on OTC testosterone until these results available.   HYPERTENSION -  This is being managed in the context of treating his CHF. Well controlled currently  Palpitations -  Patient only with occasional palpitations. Increase coreg as above. Atrial fibrillation may be contributing. AICD monitor revealed patient in a fib <2%. Continue Apixiban.   CAD - The patient has no new sypmtoms.  No further cardiovascular testing is indicated.  We will continue with aggressive risk reduction and meds as listed.  Aldine Contes. Marti Sleigh, MD, PGY2 02/27/2013 4:59 PM   History and all data above reviewed.  Patient examined.  I agree with the findings as above.  The patient continues to describe fatigue as a primary issue.  He had an episode of SOB recently.  This was treated with antibiotics.  Of note he is not watching his salt and he does not weight himself daily.  His weight is up slightly from his lowest.   The patient exam reveals COR:  Regular  ,  Lungs: RRR  ,  Abd: . Bowel, Ext No edema .  All available labs, radiology testing, previous records reviewed. Agree with documented assessment and plan. I am concerned that his ejection fraction continues to fall. I'm going to slowly try to titrate his beta blocker. As above he can get a low dose increase of his diuretic. This is just going to be for 2 days because it causes him fatigue. We will followup labs as above. I don't think that his atrial fibrillation is contributing as his in this relatively infrequently. Fayrene Fearing Abi Shoults  5:22 PM  02/27/2013

## 2013-02-28 LAB — TESTOSTERONE, FREE, TOTAL, SHBG
Testosterone, Free: 42.5 pg/mL — ABNORMAL LOW (ref 47.0–244.0)
Testosterone-% Free: 1.8 % (ref 1.6–2.9)
Testosterone: 237 ng/dL — ABNORMAL LOW (ref 300–890)

## 2013-02-28 LAB — VITAMIN D 25 HYDROXY (VIT D DEFICIENCY, FRACTURES): Vit D, 25-Hydroxy: 31 ng/mL (ref 30–89)

## 2013-03-13 ENCOUNTER — Telehealth: Payer: Self-pay | Admitting: Cardiology

## 2013-03-13 NOTE — Telephone Encounter (Signed)
New problem   Pt is having problems with both arm numbness while he is at working and sob. Please call pt on cell but he is at work.

## 2013-03-13 NOTE — Telephone Encounter (Signed)
Follow up.

## 2013-03-14 NOTE — Telephone Encounter (Signed)
Per pt - he was having weakness and SOB yesterday and feels like its because he is taking lasix.  He states he is only going to take 1/2 a pill a day from now on.  Explained to him the lasix was helping him to keep any excessive fluid from building up but he says it's causing his minerals to be too low and he wants a B 12 injection.  He reports feeling some better today.  Advised pt that if he is going to be adjusting his medications himself it will be difficult for Dr Antoine Poche to treat him and that he needs to make sure to weigh every day at the same time of day.  He states understanding.  Reviewed most recent labs.  He is going to call his PCP for tx of low testosterone.

## 2013-03-18 ENCOUNTER — Encounter: Payer: Self-pay | Admitting: Internal Medicine

## 2013-03-21 ENCOUNTER — Encounter: Payer: Self-pay | Admitting: Internal Medicine

## 2013-03-26 ENCOUNTER — Encounter (HOSPITAL_COMMUNITY): Payer: Self-pay | Admitting: Nurse Practitioner

## 2013-03-26 ENCOUNTER — Emergency Department (HOSPITAL_COMMUNITY): Payer: Managed Care, Other (non HMO)

## 2013-03-26 ENCOUNTER — Inpatient Hospital Stay (HOSPITAL_COMMUNITY)
Admission: EM | Admit: 2013-03-26 | Discharge: 2013-03-31 | DRG: 291 | Disposition: A | Discharge status: Home / Self Care | Payer: Managed Care, Other (non HMO) | Attending: Internal Medicine | Admitting: Internal Medicine

## 2013-03-26 DIAGNOSIS — J189 Pneumonia, unspecified organism: Secondary | ICD-10-CM | POA: Diagnosis present

## 2013-03-26 DIAGNOSIS — I5022 Chronic systolic (congestive) heart failure: Secondary | ICD-10-CM

## 2013-03-26 DIAGNOSIS — R0902 Hypoxemia: Secondary | ICD-10-CM

## 2013-03-26 DIAGNOSIS — Z79899 Other long term (current) drug therapy: Secondary | ICD-10-CM

## 2013-03-26 DIAGNOSIS — J449 Chronic obstructive pulmonary disease, unspecified: Secondary | ICD-10-CM | POA: Diagnosis present

## 2013-03-26 DIAGNOSIS — I509 Heart failure, unspecified: Secondary | ICD-10-CM | POA: Diagnosis present

## 2013-03-26 DIAGNOSIS — Z95810 Presence of automatic (implantable) cardiac defibrillator: Secondary | ICD-10-CM

## 2013-03-26 DIAGNOSIS — I428 Other cardiomyopathies: Secondary | ICD-10-CM | POA: Diagnosis present

## 2013-03-26 DIAGNOSIS — Z87891 Personal history of nicotine dependence: Secondary | ICD-10-CM

## 2013-03-26 DIAGNOSIS — R0602 Shortness of breath: Secondary | ICD-10-CM | POA: Diagnosis present

## 2013-03-26 DIAGNOSIS — I2510 Atherosclerotic heart disease of native coronary artery without angina pectoris: Secondary | ICD-10-CM | POA: Diagnosis present

## 2013-03-26 DIAGNOSIS — I4891 Unspecified atrial fibrillation: Secondary | ICD-10-CM | POA: Diagnosis present

## 2013-03-26 DIAGNOSIS — I5023 Acute on chronic systolic (congestive) heart failure: Principal | ICD-10-CM | POA: Diagnosis present

## 2013-03-26 DIAGNOSIS — E785 Hyperlipidemia, unspecified: Secondary | ICD-10-CM | POA: Diagnosis present

## 2013-03-26 DIAGNOSIS — I10 Essential (primary) hypertension: Secondary | ICD-10-CM | POA: Diagnosis present

## 2013-03-26 DIAGNOSIS — I4949 Other premature depolarization: Secondary | ICD-10-CM | POA: Diagnosis present

## 2013-03-26 DIAGNOSIS — I480 Paroxysmal atrial fibrillation: Secondary | ICD-10-CM | POA: Diagnosis present

## 2013-03-26 DIAGNOSIS — J4489 Other specified chronic obstructive pulmonary disease: Secondary | ICD-10-CM | POA: Diagnosis present

## 2013-03-26 DIAGNOSIS — I252 Old myocardial infarction: Secondary | ICD-10-CM

## 2013-03-26 HISTORY — DX: Presence of automatic (implantable) cardiac defibrillator: Z95.810

## 2013-03-26 HISTORY — DX: Ventricular premature depolarization: I49.3

## 2013-03-26 HISTORY — DX: Other cardiomyopathies: I42.8

## 2013-03-26 HISTORY — DX: Unspecified atrial fibrillation: I48.91

## 2013-03-26 LAB — MAGNESIUM: Magnesium: 2.2 mg/dL (ref 1.5–2.5)

## 2013-03-26 LAB — POCT I-STAT TROPONIN I: Troponin i, poc: 0.01 ng/mL (ref 0.00–0.08)

## 2013-03-26 LAB — CBC
HCT: 40.1 % (ref 39.0–52.0)
Hemoglobin: 14 g/dL (ref 13.0–17.0)
MCH: 31.3 pg (ref 26.0–34.0)
MCHC: 34.9 g/dL (ref 30.0–36.0)
RBC: 4.48 MIL/uL (ref 4.22–5.81)

## 2013-03-26 LAB — BASIC METABOLIC PANEL
BUN: 16 mg/dL (ref 6–23)
CO2: 29 mEq/L (ref 19–32)
Calcium: 8.9 mg/dL (ref 8.4–10.5)
GFR calc non Af Amer: 54 mL/min — ABNORMAL LOW (ref >90)
Glucose, Bld: 133 mg/dL — ABNORMAL HIGH (ref 70–99)

## 2013-03-26 LAB — PROTIME-INR: INR: 1.18 (ref 0.00–1.49)

## 2013-03-26 LAB — TROPONIN I: Troponin I: <0.3 ng/mL (ref <0.30)

## 2013-03-26 MED ORDER — SODIUM CHLORIDE 0.9 % IV SOLN: 250.0000 mL | INTRAVENOUS | Status: DC | PRN

## 2013-03-26 MED ORDER — SODIUM CHLORIDE 0.9 % IJ SOLN
3.0000 mL | Freq: Two times a day (BID) | INTRAMUSCULAR | Status: DC
  Administered 2013-03-26 – 2013-03-31 (×9): 3 mL via INTRAVENOUS

## 2013-03-26 MED ORDER — ATORVASTATIN CALCIUM 10 MG PO TABS: 10.0000 mg | ORAL_TABLET | Freq: Every day | ORAL | Status: DC

## 2013-03-26 MED ORDER — CARVEDILOL 6.25 MG PO TABS
6.2500 mg | ORAL_TABLET | Freq: Two times a day (BID) | ORAL | Status: DC
  Administered 2013-03-26 – 2013-03-31 (×10): 6.25 mg via ORAL
  Filled 2013-03-26 (×12): qty 1

## 2013-03-26 MED ORDER — ADULT MULTIVITAMIN W/MINERALS CH
1.0000 | ORAL_TABLET | Freq: Every day | ORAL | Status: DC
  Administered 2013-03-27 – 2013-03-31 (×5): 1 via ORAL
  Filled 2013-03-26 (×5): qty 1

## 2013-03-26 MED ORDER — SIMVASTATIN 20 MG PO TABS
20.0000 mg | ORAL_TABLET | Freq: Every day | ORAL | Status: DC
  Filled 2013-03-26: qty 1

## 2013-03-26 MED ORDER — TAMSULOSIN HCL 0.4 MG PO CAPS
0.4000 mg | ORAL_CAPSULE | Freq: Every day | ORAL | Status: DC
  Administered 2013-03-26 – 2013-03-30 (×5): 0.4 mg via ORAL
  Filled 2013-03-26 (×7): qty 1

## 2013-03-26 MED ORDER — MOMETASONE FURO-FORMOTEROL FUM 100-5 MCG/ACT IN AERO
2.0000 | INHALATION_SPRAY | Freq: Two times a day (BID) | RESPIRATORY_TRACT | Status: DC
  Administered 2013-03-26 – 2013-03-31 (×9): 2 via RESPIRATORY_TRACT
  Filled 2013-03-26 (×2): qty 8.8

## 2013-03-26 MED ORDER — APIXABAN 2.5 MG PO TABS
2.5000 mg | ORAL_TABLET | Freq: Two times a day (BID) | ORAL | Status: DC
  Administered 2013-03-26: 2.5 mg via ORAL
  Filled 2013-03-26 (×3): qty 1

## 2013-03-26 MED ORDER — PANTOPRAZOLE SODIUM 40 MG PO TBEC
40.0000 mg | DELAYED_RELEASE_TABLET | Freq: Every day | ORAL | Status: DC
  Administered 2013-03-26 – 2013-03-30 (×5): 40 mg via ORAL
  Filled 2013-03-26 (×6): qty 1

## 2013-03-26 MED ORDER — SIMVASTATIN 20 MG PO TABS
20.0000 mg | ORAL_TABLET | Freq: Every day | ORAL | Status: DC
  Administered 2013-03-26 – 2013-03-30 (×5): 20 mg via ORAL
  Filled 2013-03-26 (×6): qty 1

## 2013-03-26 MED ORDER — ONDANSETRON HCL 4 MG/2ML IJ SOLN
4.0000 mg | Freq: Four times a day (QID) | INTRAMUSCULAR | Status: DC | PRN
  Administered 2013-03-28 (×2): 4 mg via INTRAVENOUS
  Filled 2013-03-26 (×2): qty 2

## 2013-03-26 MED ORDER — TAMSULOSIN HCL 0.4 MG PO CAPS
0.4000 mg | ORAL_CAPSULE | Freq: Every day | ORAL | Status: DC
Start: 2013-03-26 — End: 2013-03-26
  Filled 2013-03-26: qty 1

## 2013-03-26 MED ORDER — FUROSEMIDE 10 MG/ML IJ SOLN
40.0000 mg | Freq: Once | INTRAMUSCULAR | Status: AC
  Administered 2013-03-26: 40 mg via INTRAVENOUS
  Filled 2013-03-26: qty 4

## 2013-03-26 MED ORDER — LISINOPRIL 40 MG PO TABS
40.0000 mg | ORAL_TABLET | Freq: Every day | ORAL | Status: DC
  Administered 2013-03-27 – 2013-03-31 (×4): 40 mg via ORAL
  Filled 2013-03-26 (×5): qty 1

## 2013-03-26 MED ORDER — VITAMIN D3 25 MCG (1000 UNIT) PO TABS
1000.0000 [IU] | ORAL_TABLET | Freq: Every day | ORAL | Status: DC
  Administered 2013-03-27 – 2013-03-31 (×5): 1000 [IU] via ORAL
  Filled 2013-03-26 (×5): qty 1

## 2013-03-26 MED ORDER — FLUTICASONE PROPIONATE 50 MCG/ACT NA SUSP
1.0000 | Freq: Every day | NASAL | Status: DC | PRN
  Administered 2013-03-29: 1 via NASAL
  Filled 2013-03-26: qty 16

## 2013-03-26 MED ORDER — SODIUM CHLORIDE 0.9 % IJ SOLN: 3.0000 mL | INTRAMUSCULAR | Status: DC | PRN

## 2013-03-26 MED ORDER — FUROSEMIDE 10 MG/ML IJ SOLN
40.0000 mg | Freq: Every day | INTRAMUSCULAR | Status: DC
  Administered 2013-03-27 – 2013-03-31 (×5): 40 mg via INTRAVENOUS
  Filled 2013-03-26 (×5): qty 4

## 2013-03-26 MED ORDER — ACETAMINOPHEN 325 MG PO TABS
650.0000 mg | ORAL_TABLET | ORAL | Status: DC | PRN
  Administered 2013-03-27: 650 mg via ORAL
  Filled 2013-03-26: qty 2

## 2013-03-26 MED ORDER — FINASTERIDE 5 MG PO TABS
5.0000 mg | ORAL_TABLET | Freq: Every morning | ORAL | Status: DC
  Administered 2013-03-27 – 2013-03-31 (×5): 5 mg via ORAL
  Filled 2013-03-26 (×5): qty 1

## 2013-03-26 MED ORDER — PANTOPRAZOLE SODIUM 40 MG PO TBEC
40.0000 mg | DELAYED_RELEASE_TABLET | Freq: Every day | ORAL | Status: DC
Start: 2013-03-26 — End: 2013-03-26

## 2013-03-26 MED ORDER — POTASSIUM CHLORIDE CRYS ER 20 MEQ PO TBCR
20.0000 meq | EXTENDED_RELEASE_TABLET | Freq: Every day | ORAL | Status: DC
  Administered 2013-03-26 – 2013-03-31 (×6): 20 meq via ORAL
  Filled 2013-03-26 (×6): qty 1

## 2013-03-26 NOTE — ED Notes (Signed)
Pt c/o sob X 4 days that has progressively gotten worse, pt sts he can't lay down and cant sleep. Pt reports he also gets sob with walking. Pt reports he has been taking his medications.

## 2013-03-26 NOTE — H&P (Signed)
[facility] History and Physical  Corey Tyler ZOX:[id number] DOB: November 03, 1941 DOA: 03/26/2013  Referring physician: er PCP: [name], MD  Specialists:   Chief Complaint: SOB  HPI: Corey Tyler is a 72 y.o. male  Who has an EF of 15%.  He has been adjusting his own medications per old records.  He presents with a several day history of SOB.  He works at [employer] and walks about 8 miles/day.  Normally does not get short of breath.  For a few days he has been having to sleep upright.  No chest pain.  No fever, no chills.  His weight has increased about 7 pounds.  He checked his BP and HR today and HR was 130s.  His PCP has treated him with a recent bout of levaquin for a suspected PNA.    In the ER, chest x ray showed ? PNA, BNP elevated.    Review of Systems: all systems reviewed, negative unless stated above    Past Medical History  Diagnosis Date  . Old myocardial infarction     Silent MI; Large fixed inferolateral defect consistent with prior infarct; No ischemia; cath in 2000 showing normal coronaries with mildly reduced EF with wall motion abnormality cath without obstruction.  Minimal plaque 2013  . Hypertension   . COPD (chronic obstructive pulmonary disease)   . Hyperlipidemia   . Chronic systolic heart failure     EF down to 20 to 25% per echo November 2012  . Cardiomyopathy - mixed November 2012  . Pulmonary nodule, right     Last scan in 2010 showing stability; felt to be benign.  . Cardiac defibrillator -dual  [facility]   . Atrial tachycardia-non sustained 04/06/2012    [name] Fortify DR   Past Surgical History  Procedure Laterality Date  . Colon surgery    . Cardiac catheterization  2000  . Icd  12/2011    Graciela Husbands  . Cardiac defibrillator placement     Social History:  reports that he quit smoking about 29 years ago. His smoking use included Cigarettes. He has a 100 pack-year smoking history. He has never used smokeless tobacco. He reports that he does  not drink alcohol or use illicit drugs.   Allergies  Allergen Reactions  . Atorvastatin Other (See Comments)    cough    Family History  Problem Relation Age of Onset  . Emphysema Mother   . Heart disease Mother     AVR  . Heart failure Father     Died agwe 86    Prior to Admission medications   Medication Sig Start Date End Date Taking? Authorizing Provider  apixaban (ELIQUIS) 5 MG TABS tablet 2.5 mg 2 (two) times daily. 12/29/12  Yes Duke Salvia, MD  carvedilol (COREG) 3.125 MG tablet Take 1 tablet (3.125 mg total) by mouth 2 (two) times daily. 02/27/13  Yes [name], MD  carvedilol (COREG) 6.25 MG tablet Take 1 tablet (6.25 mg total) by mouth 2 (two) times daily with a meal. 12/29/12  Yes Duke Salvia, MD  cholecalciferol (VITAMIN D) 1000 UNITS tablet Take 1,000 Units by mouth daily.   Yes Historical Provider, MD  finasteride (PROSCAR) 5 MG tablet Take 5 mg by mouth every morning.   Yes Historical Provider, MD  fluticasone (FLONASE) 50 MCG/ACT nasal spray Place 1 spray into the nose daily as needed. For congestion   Yes Historical Provider, MD  Fluticasone-Salmeterol (ADVAIR) 250-50 MCG/DOSE AEPB Inhale  1 puff into the lungs every 12 (twelve) hours.   Yes Historical Provider, MD  furosemide (LASIX) 40 MG tablet Take 20 mg by mouth daily. 11/09/12 11/09/13 Yes [name], MD  lisinopril (PRINIVIL,ZESTRIL) 40 MG tablet Take 40 mg by mouth daily.    Yes Historical Provider, MD  Multiple Vitamin (MULITIVITAMIN WITH MINERALS) TABS Take 1 tablet by mouth daily.   Yes Historical Provider, MD  nitroGLYCERIN (NITROSTAT) 0.4 MG SL tablet Place 0.4 mg under the tongue every 5 (five) minutes as needed. For chest pain 11/12/11  Yes Rosalio Macadamia, NP  Omega-3 Fatty Acids (FISH OIL) 1200 MG CAPS Take 1 capsule by mouth 2 (two) times daily.     Yes Historical Provider, MD  pantoprazole (PROTONIX) 40 MG tablet Take 40 mg by mouth daily.     Yes Historical Provider, MD  psyllium  (METAMUCIL) 58.6 % powder Take 1 packet by mouth daily.    Yes Historical Provider, MD  rosuvastatin (CRESTOR) 5 MG tablet Take 5 mg by mouth daily.     Yes Historical Provider, MD  Tamsulosin HCl (FLOMAX) 0.4 MG CAPS Take 0.4 mg by mouth daily.     Yes Historical Provider, MD   Physical Exam: Filed Vitals:   03/26/13 1230 03/26/13 1231 03/26/13 1300 03/26/13 1330  BP:   118/78 136/59  Pulse:  73 139 61  Temp: 98.1 F (36.7 C)     TempSrc: Oral     Resp:  16 16 22   SpO2:  90% 92% 95%     General:  A+Ox3, NAD  Eyes: wnl  ENT: wnl  Neck: supple  Cardiovascular: irregular  Respiratory: decreased BS L>R  Abdomen: +BS, soft, NT  Skin: no rashes  Musculoskeletal: moves all 4 ext  Psychiatric: no SI/no HI  Neurologic: no focal deficits  Labs on Admission:  Basic Metabolic Panel:  Recent Labs Lab 03/26/13 1139  NA 141  K 3.5  CL 104  CO2 29  GLUCOSE 133*  BUN 16  CREATININE 1.28  CALCIUM 8.9  MG 2.2   Liver Function Tests: No results found for this basename: AST, ALT, ALKPHOS, BILITOT, PROT, ALBUMIN,  in the last 168 hours No results found for this basename: LIPASE, AMYLASE,  in the last 168 hours No results found for this basename: AMMONIA,  in the last 168 hours CBC:  Recent Labs Lab 03/26/13 1139  WBC 7.4  HGB 14.0  HCT 40.1  MCV 89.5  PLT 166   Cardiac Enzymes: No results found for this basename: CKTOTAL, CKMB, CKMBINDEX, TROPONINI,  in the last 168 hours  BNP (last 3 results)  Recent Labs  03/26/13 1139  PROBNP 3488.0*   CBG: No results found for this basename: GLUCAP,  in the last 168 hours  Radiological Exams on Admission: Dg Chest 2 View  03/26/2013  *RADIOLOGY REPORT*  Clinical Data: Shortness of breath, nausea  CHEST - 2 VIEW  Comparison: 01/06/2013  Findings: Cardiomediastinal silhouette is unremarkable.  There is patchy infiltrate in the left base suspicious for pneumonia.  Dual lead cardiac pacemaker is unchanged in position. No  pulmonary edema.  IMPRESSION: Patchy airspace disease in the left base suspicious for pneumonia. No pulmonary edema.   Original Report Authenticated By: Natasha Mead, M.D.     EKG: Independently reviewed.  A fib  Assessment/Plan Active Problems:   Atrial fibrillation   SOB (shortness of breath)   Acute on chronic systolic heart failure   1. Acute on chronic systoic HF- last  echo was 1/14 with decreased EF- about 15%, IV lasix, daily weights, I&Os, BNP elevated, recheck in AM, Kdur- depending on diuresis may need cardiology consult in AM, check Mg 2. SOB- O2 as tolerated 3. Atrial fib- on blood thinner, coreg- continue rate control- ICD interrogated 4. ?PNA- treated with levaquin as outpatient- hold on abx for now- no WBC    Code Status: full Family Communication: son at bedside Disposition Plan: home  Time spent: 69 min  Marlin Canary [facility] Pager [phone]  If 7PM-7AM, please contact night-coverage [email or web address] Password [email or web address] 03/26/2013, 1:56 PM

## 2013-03-26 NOTE — ED Notes (Signed)
Wife phone # [phone] cell

## 2013-03-26 NOTE — ED Provider Notes (Signed)
History     CSN: [id number]  Arrival date & time 03/26/13  1129   First MD Initiated Contact with Patient 03/26/13 1135      Chief Complaint  Patient presents with  . Shortness of Breath    (Consider location/radiation/quality/duration/timing/severity/associated sxs/prior treatment) Patient is a 72 y.o. male presenting with shortness of breath. The history is provided by the patient.  Shortness of Breath Severity:  Moderate Associated symptoms: cough   Associated symptoms: no abdominal pain, no chest pain, no headaches, no rash and no vomiting    patient presents with worsening shortness of breath or release last 4 days. Is a history of systolic heart failure. He has an AICD. He has put on about 7 pounds. Wife states he has not been compliant with his diet. He states he can no longer sleep. He has increasing shortness of breath both with exertion and at rest. He states it comes in episodes. No chest pain. No fevers. He states he has had a little cough. He states he's been on antibiotics twice for shortness of breath.  Past Medical History  Diagnosis Date  . Old myocardial infarction     Silent MI; Large fixed inferolateral defect consistent with prior infarct; No ischemia; cath in 2000 showing normal coronaries with mildly reduced EF with wall motion abnormality cath without obstruction.  Minimal plaque 2013  . Hypertension   . COPD (chronic obstructive pulmonary disease)   . Hyperlipidemia   . Chronic systolic heart failure     EF down to 20 to 25% per echo November 2012  . Cardiomyopathy - mixed November 2012  . Pulmonary nodule, right     Last scan in 2010 showing stability; felt to be benign.  . Cardiac defibrillator -dual  [employer]   . Atrial tachycardia-non sustained 04/06/2012    [facility] [name] DR    Past Surgical History  Procedure Laterality Date  . Colon surgery    . Cardiac catheterization  2000  . Icd  12/2011    Graciela Husbands  . Cardiac defibrillator placement       Family History  Problem Relation Age of Onset  . Emphysema Mother   . Heart disease Mother     AVR  . Heart failure Father     Died agwe 29    History  Substance Use Topics  . Smoking status: Former Smoker -- 2.50 packs/day for 40 years    Types: Cigarettes    Quit date: 11/24/1983  . Smokeless tobacco: Never Used     Comment: 2 1/2 ppd x 40 years  . Alcohol Use: No      Review of Systems  Constitutional: Negative for activity change and appetite change.  HENT: Negative for neck stiffness.   Eyes: Negative for pain.  Respiratory: Positive for cough and shortness of breath. Negative for chest tightness.   Cardiovascular: Positive for leg swelling. Negative for chest pain.  Gastrointestinal: Negative for nausea, vomiting, abdominal pain and diarrhea.  Genitourinary: Negative for flank pain.  Musculoskeletal: Negative for back pain.  Skin: Negative for rash.  Neurological: Negative for weakness, numbness and headaches.  Psychiatric/Behavioral: Negative for behavioral problems.    Allergies  Atorvastatin  Home Medications   Current Outpatient Rx  Name  Route  Sig  Dispense  Refill  . apixaban (ELIQUIS) 5 MG TABS tablet      2.5 mg 2 (two) times daily.         . carvedilol (COREG) 3.125 MG tablet  Oral   Take 1 tablet (3.125 mg total) by mouth 2 (two) times daily.   180 tablet   3   . carvedilol (COREG) 6.25 MG tablet   Oral   Take 1 tablet (6.25 mg total) by mouth 2 (two) times daily with a meal.   60 tablet   11   . cholecalciferol (VITAMIN D) 1000 UNITS tablet   Oral   Take 1,000 Units by mouth daily.         . finasteride (PROSCAR) 5 MG tablet   Oral   Take 5 mg by mouth every morning.         . fluticasone (FLONASE) 50 MCG/ACT nasal spray   Nasal   Place 1 spray into the nose daily as needed. For congestion         . Fluticasone-Salmeterol (ADVAIR) 250-50 MCG/DOSE AEPB   Inhalation   Inhale 1 puff into the lungs every 12 (twelve)  hours.         . furosemide (LASIX) 40 MG tablet   Oral   Take 20 mg by mouth daily.         Marland Kitchen lisinopril (PRINIVIL,ZESTRIL) 40 MG tablet   Oral   Take 40 mg by mouth daily.          . Multiple Vitamin (MULITIVITAMIN WITH MINERALS) TABS   Oral   Take 1 tablet by mouth daily.         . nitroGLYCERIN (NITROSTAT) 0.4 MG SL tablet   Sublingual   Place 0.4 mg under the tongue every 5 (five) minutes as needed. For chest pain         . Omega-3 Fatty Acids (FISH OIL) 1200 MG CAPS   Oral   Take 1 capsule by mouth 2 (two) times daily.           . pantoprazole (PROTONIX) 40 MG tablet   Oral   Take 40 mg by mouth daily.           . psyllium (METAMUCIL) 58.6 % powder   Oral   Take 1 packet by mouth daily.          . rosuvastatin (CRESTOR) 5 MG tablet   Oral   Take 5 mg by mouth daily.           . Tamsulosin HCl (FLOMAX) 0.4 MG CAPS   Oral   Take 0.4 mg by mouth daily.             BP 109/69  Pulse 73  Temp(Src) 98.1 F (36.7 C) (Oral)  Resp 16  SpO2 90%  Physical Exam  Nursing note and vitals reviewed. Constitutional: He is oriented to person, place, and time. He appears well-developed and well-nourished.  HENT:  Head: Normocephalic and atraumatic.  Eyes: EOM are normal. Pupils are equal, round, and reactive to light.  Neck: Normal range of motion. Neck supple. No thyromegaly present.  Cardiovascular: Normal rate and normal heart sounds.   No murmur heard. irregular  Pulmonary/Chest: Effort normal. He has rales.  Rales bilateral bases.  Abdominal: Soft. Bowel sounds are normal. He exhibits no distension and no mass. There is no tenderness. There is no rebound and no guarding.  Musculoskeletal: Normal range of motion. He exhibits edema.  Bilateral lower extremity pitting edema.  Neurological: He is alert and oriented to person, place, and time. No cranial nerve deficit.  Skin: Skin is warm and dry.  Psychiatric: He has a normal mood and affect.  ED Course  Procedures (including critical care time)  Labs Reviewed  BASIC METABOLIC PANEL - Abnormal; Notable for the following:    Glucose, Bld 133 (*)    GFR calc non Af Amer 54 (*)    GFR calc Af Amer 62 (*)    All other components within normal limits  PRO B NATRIURETIC PEPTIDE - Abnormal; Notable for the following:    Pro B Natriuretic peptide (BNP) 3488.0 (*)    All other components within normal limits  CBC  PROTIME-INR  APTT  MAGNESIUM  POCT I-STAT TROPONIN I   Dg Chest 2 View  03/26/2013  *RADIOLOGY REPORT*  Clinical Data: Shortness of breath, nausea  CHEST - 2 VIEW  Comparison: 01/06/2013  Findings: Cardiomediastinal silhouette is unremarkable.  There is patchy infiltrate in the left base suspicious for pneumonia.  Dual lead cardiac pacemaker is unchanged in position. No pulmonary edema.  IMPRESSION: Patchy airspace disease in the left base suspicious for pneumonia. No pulmonary edema.   Original Report Authenticated By: Natasha Mead, M.D.      1. CHF (congestive heart failure)   2. Hypoxia      Date: 03/26/2013  Rate: 93  Rhythm: atrial fibrillation and premature ventricular contractions (PVC)  QRS Axis: normal  Intervals: afib  ST/T Wave abnormalities: normal  Conduction Disutrbances:afib  Narrative Interpretation:   Old EKG Reviewed: changes noted and afib is new sicne last tracing    MDM  Patient presents with shortness of breath. He is a history of CHF and his weights or rub. Said no chest pain. He's had no fevers. He's had minimal cough. He's been on antibiotics previously. BNP is elevated. Patient sounds fluid overloaded on his exam. His x-ray showed possible pneumonia, however this could be residual from previous probably treated pneumonia. He does not have an elevated white count. After discussion the hospitalist will not treat with antibiotics at this time, and will be followed. He will be given Lasix and will be admitted.        Juliet Rude.  Rubin Payor, MD 03/26/13 1347

## 2013-03-26 NOTE — ED Notes (Signed)
Pt reports he has felt SOB over past few days. Reports he has pacemaker/defibrillator and normally his pulse rate is 40's to 60's but when he checked at home today it was 113. Denies any pain. A&Ox4, able to speak in full sentences in triage

## 2013-03-27 ENCOUNTER — Other Ambulatory Visit: Payer: Self-pay

## 2013-03-27 ENCOUNTER — Encounter: Payer: Self-pay | Admitting: Internal Medicine

## 2013-03-27 ENCOUNTER — Encounter (HOSPITAL_COMMUNITY): Payer: Self-pay | Admitting: Physician Assistant

## 2013-03-27 ENCOUNTER — Ambulatory Visit (INDEPENDENT_AMBULATORY_CARE_PROVIDER_SITE_OTHER): Payer: Managed Care, Other (non HMO) | Admitting: *Deleted

## 2013-03-27 DIAGNOSIS — I4891 Unspecified atrial fibrillation: Secondary | ICD-10-CM

## 2013-03-27 DIAGNOSIS — I4949 Other premature depolarization: Secondary | ICD-10-CM

## 2013-03-27 DIAGNOSIS — R0602 Shortness of breath: Secondary | ICD-10-CM

## 2013-03-27 DIAGNOSIS — I5023 Acute on chronic systolic (congestive) heart failure: Secondary | ICD-10-CM

## 2013-03-27 DIAGNOSIS — I5022 Chronic systolic (congestive) heart failure: Secondary | ICD-10-CM

## 2013-03-27 DIAGNOSIS — Z95810 Presence of automatic (implantable) cardiac defibrillator: Secondary | ICD-10-CM

## 2013-03-27 DIAGNOSIS — I509 Heart failure, unspecified: Secondary | ICD-10-CM

## 2013-03-27 DIAGNOSIS — I428 Other cardiomyopathies: Secondary | ICD-10-CM

## 2013-03-27 LAB — REMOTE ICD DEVICE
AL AMPLITUDE: 3.6 mv
AL IMPEDENCE ICD: 410 Ohm
BAMS-0001: 170 {beats}/min
BAMS-0003: 70 {beats}/min
BRDY-0002RA: 60 {beats}/min
DEV-0020ICD: NEGATIVE
RV LEAD AMPLITUDE: 12 mv
TZAT-0004SLOWVT: 8
TZAT-0012SLOWVT: 200 ms
TZAT-0013SLOWVT: 3
TZAT-0018SLOWVT: NEGATIVE
TZON-0003SLOWVT: 300 ms
TZON-0010SLOWVT: 40 ms
TZST-0001SLOWVT: 3
TZST-0001SLOWVT: 5
TZST-0003SLOWVT: 890 V

## 2013-03-27 LAB — BASIC METABOLIC PANEL
BUN: 17 mg/dL (ref 6–23)
Creatinine, Ser: 1.18 mg/dL (ref 0.50–1.35)
GFR calc Af Amer: 69 mL/min — ABNORMAL LOW (ref >90)
GFR calc non Af Amer: 59 mL/min — ABNORMAL LOW (ref >90)
Glucose, Bld: 112 mg/dL — ABNORMAL HIGH (ref 70–99)
Potassium: 4.4 mEq/L (ref 3.5–5.1)

## 2013-03-27 LAB — HEPATIC FUNCTION PANEL
ALT: 22 U/L (ref 0–53)
Alkaline Phosphatase: 75 U/L (ref 39–117)
Indirect Bilirubin: 0.6 mg/dL (ref 0.3–0.9)
Total Bilirubin: 0.8 mg/dL (ref 0.3–1.2)
Total Protein: 6.6 g/dL (ref 6.0–8.3)

## 2013-03-27 MED ORDER — AMIODARONE HCL 200 MG PO TABS
200.0000 mg | ORAL_TABLET | Freq: Three times a day (TID) | ORAL | Status: DC
  Administered 2013-03-27 – 2013-03-31 (×13): 200 mg via ORAL
  Filled 2013-03-27 (×15): qty 1

## 2013-03-27 MED ORDER — APIXABAN 5 MG PO TABS
5.0000 mg | ORAL_TABLET | Freq: Two times a day (BID) | ORAL | Status: DC
  Administered 2013-03-27 – 2013-03-31 (×9): 5 mg via ORAL
  Filled 2013-03-27 (×11): qty 1

## 2013-03-27 NOTE — Progress Notes (Signed)
[facility] PROGRESS NOTE  Corey Tyler:[id number] DOB: 09-14-1941 DOA: 03/26/2013 PCP: [name], MD  Assessment/Plan: 1. Acute on chronic systoic HF- Cardiology on board and managing at this juncture.  Cardiology suspects symptoms more consistent with CHF exacerbation.  Troponins negative x 3. On lasix. 2. SOB-  Continue with supplemental oxygen.  Pt feels improved. Improved with lasix and improved heart rate control 3. Atrial fib- Cardiologist managing. Considering starting amiodarone 200 mg q 8hr and apixaban for anticoagulation.   Code Status: full  Family Communication:  Discussed with patient and family at bedside.  Disposition Plan: Pending further recommendations from cardiologist   Consultants:  Cardiology: [place]  Procedures:  none  Antibiotics:  None  HPI/Subjective: No acute issues reported overnight.  Patient was inquiring about discharge.  States he feels better.  Objective: Filed Vitals:   03/27/13 0505 03/27/13 0752 03/27/13 1036 03/27/13 1343  BP:   95/71 108/78  Pulse:   75 61  Temp:    97.4 F (36.3 C)  TempSrc:    Oral  Resp:   18 20  Height:      Weight: 91.763 kg (202 lb 4.8 oz)     SpO2:  92% 96% 93%    Intake/Output Summary (Last 24 hours) at 03/27/13 1810 Last data filed at 03/27/13 1654  Gross per 24 hour  Intake    840 ml  Output   2600 ml  Net  -1760 ml   Filed Weights   03/26/13 1459 03/27/13 0500 03/27/13 0505  Weight: 93.8 kg (206 lb 12.7 oz) 92.851 kg (204 lb 11.2 oz) 91.763 kg (202 lb 4.8 oz)    Exam:   General:  Pt in NAD, Alert and Awake  Cardiovascular: S1 and S2 wnl, no rubs  Respiratory: no wheezes, speaking in full sentences, no increased wob on room air  Abdomen: soft, NT, ND  Musculoskeletal:  No cyanosis or clubbing   Data Reviewed: Basic Metabolic Panel:  Recent Labs Lab 03/26/13 1139 03/27/13 0248  NA 141 139  K 3.5 4.4  CL 104 101  CO2 29 29  GLUCOSE 133* 112*  BUN 16 17   CREATININE 1.28 1.18  CALCIUM 8.9 8.8  MG 2.2  --    Liver Function Tests:  Recent Labs Lab 03/27/13 1004  AST 18  ALT 22  ALKPHOS 75  BILITOT 0.8  PROT 6.6  ALBUMIN 3.3*   No results found for this basename: LIPASE, AMYLASE,  in the last 168 hours No results found for this basename: AMMONIA,  in the last 168 hours CBC:  Recent Labs Lab 03/26/13 1139  WBC 7.4  HGB 14.0  HCT 40.1  MCV 89.5  PLT 166   Cardiac Enzymes:  Recent Labs Lab 03/26/13 1540 03/26/13 2037 03/27/13 0248  TROPONINI <0.30 <0.30 <0.30   BNP (last 3 results)  Recent Labs  03/26/13 1139 03/27/13 0248  PROBNP 3488.0* 2908.0*   CBG: No results found for this basename: GLUCAP,  in the last 168 hours  No results found for this or any previous visit (from the past 240 hour(s)).   Studies: Dg Chest 2 View  03/26/2013  *RADIOLOGY REPORT*  Clinical Data: Shortness of breath, nausea  CHEST - 2 VIEW  Comparison: 01/06/2013  Findings: Cardiomediastinal silhouette is unremarkable.  There is patchy infiltrate in the left base suspicious for pneumonia.  Dual lead cardiac pacemaker is unchanged in position. No pulmonary edema.  IMPRESSION: Patchy airspace disease in the left base suspicious for  pneumonia. No pulmonary edema.   Original Report Authenticated By: Natasha Mead, M.D.     Scheduled Meds: . amiodarone  200 mg Oral Q8H  . apixaban  5 mg Oral BID  . carvedilol  6.25 mg Oral BID WC  . cholecalciferol  1,000 Units Oral Daily  . finasteride  5 mg Oral q morning - 10a  . furosemide  40 mg Intravenous Daily  . lisinopril  40 mg Oral Daily  . mometasone-formoterol  2 puff Inhalation BID  . multivitamin with minerals  1 tablet Oral Daily  . pantoprazole  40 mg Oral Daily  . potassium chloride  20 mEq Oral Daily  . simvastatin  20 mg Oral q1800  . sodium chloride  3 mL Intravenous Q12H  . tamsulosin  0.4 mg Oral Daily   Continuous Infusions:   Active Problems:   Atrial fibrillation   SOB  (shortness of breath)   Acute on chronic systolic heart failure    Time spent: > 35 minutes    [name]  [facility] Pager [phone]. If 7PM-7AM, please contact night-coverage at [email or web address], password [email or web address] 03/27/2013, 6:10 PM  LOS: 1 day

## 2013-03-27 NOTE — Consult Note (Signed)
CARDIOLOGY CONSULT NOTE  Patient ID: Corey Tyler, MRN: [id number], DOB/AGE: March 09, 1941 72 y.o. Admit date: 03/26/2013   Date of Consult: 03/27/2013 Primary Physician: [name], MD Primary Cardiologist: [email or web address] (EP - Graciela Husbands)  Chief Complaint: sob, elevated HR Reason for Consult: CHF, afib  HPI: Corey Tyler is a 72 y/o M with history of HTN, COPD, HL, chronic systolic CHF, and afib who presented to [facility] with SOB, orthopnea and elevated HR. EF in 2008 was about 40-45%, then dropped to 20-25% in late 2012. F/u cardiac cath early 2013 showed only minimal CAD. He had an ICD placed 12/2011. Most recent echo 12/13/12 showed worsened EF to 15% as well as grade I diastolic dysfunction, mild AI and moderate MR. He was recently seen in the office 02/27/13  with complaints of SOB, fatigue, and slightly increased edema. AICD interrogation revealed the patient in afib <2% of the time. Recommendation was to increase Lasix to 40mg  short term then return to 20mg , but he has been taking 40mg  daily consistently since that time. Testosterone level was slightly low and he was instructed to f/u PCP which he hasn't done yet. Since that time, he also self-reduced his apixaban dose to 2.5mg  BID - he states it was too strong for him but cannot recall the symptoms that made him feel this way. He did not discuss this with Dr. Antoine Poche. Shortly after his office visit, he was treated with Levaquin for PNA by PCP with overall improvement in dyspnea. However, 2-3 days ago he's noticed progressive dyspnea and orthopnea. He tried to go to work yesterday (at [employer]) but had to leave due to SOB. He also noticed his HR was 110-130 which was unusual for him so came to the ER where he was found to be in afib with frequent PVCs. He is vaguely aware of palpitations. ICD interrogation reveals 119 AMS since 12/29/12 (12%), some AMS due to far field, most AMS due to atrial events (peak V 171), some EGMs showing bigeminy PVCs. CXR on  admission: patchy airspace disease at L base suspicious for PNA but he is afebrile with normal WBC. pBNP 3488, tropnins neg x 4, CBC/BMET unremarkable except glucose mildly elevated. He received 40mg  IV Lasix with brisk diuresis of 2800 UOP. Weight 206-> 202 today which is his reported dry weight. He feels somewhat better since admission. No chest pain, dizziness, syncope or ICD discharge. Home dose of Coreg is 9.375 BID. He reports noncompliance with salt restriction but is trying to do better.   Past Medical History  Diagnosis Date  . Old myocardial infarction     a. ?Silent MI. b. Large fixed inferolateral defect c/w with prior infarct, no ischemia. c. Cath in 2000/2013 with only minimal CAD.  Marland Kitchen Hypertension   . COPD (chronic obstructive pulmonary disease)   . Hyperlipidemia   . Chronic systolic heart failure     EF down to 20 to 25% per echo November 2012  . NICM (nonischemic cardiomyopathy)     a. Normal cors 2000. b. Minimal plaque 2013.  . Pulmonary nodule, right     Last scan in 2010 showing stability; felt to be benign.  . Cardiac defibrillator -dual  [facility]   . Atrial tachycardia-non sustained     a. Noted on 03/2012 interrogation.  Marland Kitchen A-fib     a. Noted on 12/2012 interrogation, placed on Apixaban.  Marland Kitchen PVC's (premature ventricular contractions)       Most Recent Cardiac Studies: 2D Echo 11/2012  Study Conclusions - Left ventricle: The cavity size was mildly dilated. Wallthickness was normal. The estimated ejection fraction was 15%. Diffuse hypokinesis. There is akinesis of the inferoposterior myocardium. Doppler parameters are consistent with abnormal left ventricular relaxation (grade 1 diastolic dysfunction). - Aortic valve: Mild regurgitation. - Mitral valve: Moderate regurgitation. - Left atrium: The atrium was mildly dilated. - Right ventricle: The cavity size was mildly dilated. - Right atrium: The atrium was mildly dilated.  Cardiac Cath 12/18/2011 Procedure: Right  Heart Cath, Left Heart Cath, Selective Coronary Angiography, LV angiography  Indication: Cardiomyopathy with evidence of inferior infarct. He is being considered for an ICD  Procedural Details: The right groin was prepped, draped, and anesthetized with 1% lidocaine. Using the modified Seldinger technique a 5 French sheath was placed in the right femoral artery and a 7 French sheath was placed in the right femoral vein. A Swan-Ganz catheter was used for the right heart catheterization. Standard protocol was followed for recording of right heart pressures and sampling of oxygen saturations. Fick cardiac output was calculated. Standard Judkins catheters were used for selective coronary angiography and left ventriculography. There were no immediate procedural complications. The patient was transferred to the post catheterization recovery area for further monitoring.  Procedural Findings:  Hemodynamics:  RA 4  RV 31/6  PA 34/7 mean 21  PCWP 6  LV 123/12  AO 125/90  Oxygen saturations:  PA 57  AO 97  Cardiac Output (Fick) 3.9 Cardiac Index (Fick) 1.8  Coronary angiography:  Coronary dominance: right  Left mainstem: NL  Left anterior descending (LAD): Mild luminal irregularities. Proximal mild calcification. Proximal to mid long 25% stenosis. First diagonal tiny with 30% stenosis. Second diagonal small to moderate size with luminal irregularities.  Left circumflex (LCx): Proximal AV groove normal. Large mid obtuse marginal normal.  Right coronary artery (RCA): Very large. Normal. PDA moderate sized and normal  Left ventriculography: LV was not injected because of mild/moderate renal insufficiency. He is known to have global LV dysfunction with inferior wall akinesis and EF of 20 - 25%.  Final Conclusions: Minimal CAD. Normal right heart pressures.  Recommendations: ICD per Dr. Graciela Husbands. Continue medical management of LV dysfunction.   Surgical History:  Past Surgical History  Procedure Laterality  Date  . Colon surgery    . Cardiac catheterization  2000  . Icd  12/2011    Graciela Husbands  . Cardiac defibrillator placement       Home Meds: Prior to Admission medications   Medication Sig Start Date End Date Taking? Authorizing Provider  apixaban (ELIQUIS) 5 MG TABS tablet 2.5 mg 2 (two) times daily. 12/29/12  Yes Duke Salvia, MD  carvedilol (COREG) 3.125 MG tablet Take 1 tablet (3.125 mg total) by mouth 2 (two) times daily. 02/27/13  Yes [name], MD  carvedilol (COREG) 6.25 MG tablet Take 1 tablet (6.25 mg total) by mouth 2 (two) times daily with a meal. 12/29/12  Yes Duke Salvia, MD  cholecalciferol (VITAMIN D) 1000 UNITS tablet Take 1,000 Units by mouth daily.   Yes Historical Provider, MD  finasteride (PROSCAR) 5 MG tablet Take 5 mg by mouth every morning.   Yes Historical Provider, MD  fluticasone (FLONASE) 50 MCG/ACT nasal spray Place 1 spray into the nose daily as needed. For congestion   Yes Historical Provider, MD  Fluticasone-Salmeterol (ADVAIR) 250-50 MCG/DOSE AEPB Inhale 1 puff into the lungs every 12 (twelve) hours.   Yes Historical Provider, MD  furosemide (LASIX) 40 MG tablet Take  20 mg by mouth daily. 11/09/12 11/09/13 Yes [name], MD  lisinopril (PRINIVIL,ZESTRIL) 40 MG tablet Take 40 mg by mouth daily.    Yes Historical Provider, MD  Multiple Vitamin (MULITIVITAMIN WITH MINERALS) TABS Take 1 tablet by mouth daily.   Yes Historical Provider, MD  nitroGLYCERIN (NITROSTAT) 0.4 MG SL tablet Place 0.4 mg under the tongue every 5 (five) minutes as needed. For chest pain 11/12/11  Yes Rosalio Macadamia, NP  Omega-3 Fatty Acids (FISH OIL) 1200 MG CAPS Take 1 capsule by mouth 2 (two) times daily.     Yes Historical Provider, MD  pantoprazole (PROTONIX) 40 MG tablet Take 40 mg by mouth daily.     Yes Historical Provider, MD  psyllium (METAMUCIL) 58.6 % powder Take 1 packet by mouth daily.    Yes Historical Provider, MD  rosuvastatin (CRESTOR) 5 MG tablet Take 5 mg by mouth  daily.     Yes Historical Provider, MD  Tamsulosin HCl (FLOMAX) 0.4 MG CAPS Take 0.4 mg by mouth daily.     Yes Historical Provider, MD    Inpatient Medications:  . apixaban  2.5 mg Oral BID  . carvedilol  6.25 mg Oral BID WC  . cholecalciferol  1,000 Units Oral Daily  . finasteride  5 mg Oral q morning - 10a  . furosemide  40 mg Intravenous Daily  . lisinopril  40 mg Oral Daily  . mometasone-formoterol  2 puff Inhalation BID  . multivitamin with minerals  1 tablet Oral Daily  . pantoprazole  40 mg Oral Daily  . potassium chloride  20 mEq Oral Daily  . simvastatin  20 mg Oral q1800  . sodium chloride  3 mL Intravenous Q12H  . tamsulosin  0.4 mg Oral Daily    Allergies:  Allergies  Allergen Reactions  . Atorvastatin Other (See Comments)    cough    History   Social History  . Marital Status: Married    Spouse Name: N/A    Number of Children: 2  . Years of Education: N/A   Occupational History  . Retired     Airline pilot  . Works [employer]   Social History Main Topics  . Smoking status: Former Smoker -- 2.50 packs/day for 40 years    Types: Cigarettes    Quit date: 11/24/1983  . Smokeless tobacco: Never Used     Comment: 2 1/2 ppd x 40 years  . Alcohol Use: No  . Drug Use: No  . Sexually Active: Not on file   Other Topics Concern  . Not on file   Social History Narrative  . No narrative on file     Family History  Problem Relation Age of Onset  . Emphysema Mother   . Heart disease Mother     AVR  . Heart failure Father     Died agwe 84     Review of Systems: General: negative for chills, fever, night sweats Cardiovascular: no chest pain  Dermatological: negative for rash Respiratory: negative for cough or wheezing Urologic: negative for hematuria Abdominal: negative for vomiting, diarrhea, bright red blood per rectum, melena, or hematemesis. Mild intermittent nausea Neurologic: negative for visual changes, syncope, or dizziness All other systems  reviewed and are otherwise negative except as noted above.  Labs:  Recent Labs  03/26/13 1540 03/26/13 2037 03/27/13 0248  TROPONINI <0.30 <0.30 <0.30   Lab Results  Component Value Date   WBC 7.4 03/26/2013   HGB 14.0 03/26/2013  HCT 40.1 03/26/2013   MCV 89.5 03/26/2013   PLT 166 03/26/2013    Recent Labs Lab 03/27/13 0248  NA 139  K 4.4  CL 101  CO2 29  BUN 17  CREATININE 1.18  CALCIUM 8.8  GLUCOSE 112*     Radiology/Studies:  Dg Chest 2 View 03/26/2013  *RADIOLOGY REPORT*  Clinical Data: Shortness of breath, nausea  CHEST - 2 VIEW  Comparison: 01/06/2013  Findings: Cardiomediastinal silhouette is unremarkable.  There is patchy infiltrate in the left base suspicious for pneumonia.  Dual lead cardiac pacemaker is unchanged in position. No pulmonary edema.  IMPRESSION: Patchy airspace disease in the left base suspicious for pneumonia. No pulmonary edema.   Original Report Authenticated By: Natasha Mead, M.D.     EKG:  Yesterday two tracings: 1) atrial fib 103 frequent PVCs, TWI V4-V6  2) atrial fib 93bpm occ PVCs TW V4-V6 TWI, afib new from last tracing  Physical Exam: Blood pressure 114/77, pulse 80, temperature 98 F (36.7 C), temperature source Oral, resp. rate 20, height 6\' 1"  (1.854 m), weight 202 lb 4.8 oz (91.763 kg), SpO2 92.00%. General: Well developed, well nourished Corey Tyler in no acute distress. Head: Normocephalic, atraumatic, sclera non-icteric, no xanthomas, nares are without discharge.  Neck: Negative for carotid bruits. JVD not elevated. Lungs: Diminished BS throughout with coarse crackles 1/2way up bilaterally. Breathing is unlabored. Heart: Irregularly irregular with S1 S2. No murmurs, rubs, or gallops appreciated. Abdomen: Soft, non-tender, non-distended with normoactive bowel sounds. No hepatomegaly. No rebound/guarding. No obvious abdominal masses. Msk:  Strength and tone appear normal for age. Extremities: No clubbing or cyanosis. No edema.  Distal pedal  pulses are 2+ and equal bilaterally. Neuro: Alert and oriented X 3. No facial asymmetry. No focal deficit. Moves all extremities spontaneously. Psych:  Responds to questions appropriately with a normal affect.   Assessment and Plan:   1. Acute on chronic systolic CHF with NICM EF 15% (s/p ICD) 2. Paroxysmal atrial fibrillation with RVR 3. Frequent PVCs 4. Compliance deviation (pt self-adjusted apixaban, noncompliant with salt) 5. HTN, controlled 6. Hyperglycemia  Symptoms sound more consistent with CHF likely exacerbated by salt indiscretion +/- atrial fibrillation/PVCs. Continue IV Lasix as ordered given good output so far, exam still with rales. His rhythm may be causing him to feel poorly as well. Per discussion with Dr. Eden Emms, will start amiodarone 200mg  q8hr. TSH in process - add baseline LFTs to labs and consider OP baseline PFTs. Check A1C given elevated sugar. Would re-try patient on apixaban 5mg  BID (he does not recall why he cut the dose down, no reported bleeding or anemia). We will ask EP to weigh in regarding his atrial fibrillation and frequent ectopy. Keep BB at current dose until their evaluation (home dose 9.375 BID).  Signed, Dayna Dunn PA-C 03/27/2013, 9:06 AM  Interogation of pacer shows mode switch up to 12% with ectopy on monitor Will start amiodarone  200 tid.  Good diuresis with lasix.  Still with basilar crakles on exam  Improving. Etiology of deteriorating EF not clear.    Charlton Haws

## 2013-03-27 NOTE — Progress Notes (Signed)
Utilization Review Completed.   Randal Goens, RN, BSN Nurse Case Manager  [phone]  

## 2013-03-27 NOTE — Consult Note (Addendum)
Reason for Consult:Worsening LV dysfunction, persistent atrial fib, and PVC's   Referring Physician: Dr. Drue Flirt Corey Tyler is an 72 y.o. male.   HPI: The patient is a 72 yo man with a h/o worsening LV dysfunction who is admitted with acute on chronic systolic CHF. His history of heart problems began over 10 years ago and have progressed with worsening LV dysfunction and CHF. He notes that his CHF symptoms worsened a couple of weeks ago. He is admitted for additional evaluation. He was noted on the monitor to be in atrial fib with frequent PVC's. Also, he has had an increased amount of ventricular pacing with a pacing induced left bundly branch block. He denies peripheral edema. No anginal symptoms. No ICD shock. His device was interogated in the ED and I do not have those results at this time.  PMH: Past Medical History  Diagnosis Date  . Old myocardial infarction     a. ?Silent MI. b. Large fixed inferolateral defect c/w with prior infarct, no ischemia. c. Cath in 2000/2013 with only minimal CAD.  Marland Kitchen Hypertension   . COPD (chronic obstructive pulmonary disease)   . Hyperlipidemia   . Chronic systolic heart failure     EF down to 20 to 25% per echo November 2012  . NICM (nonischemic cardiomyopathy)     a. Normal cors 2000. b. Minimal plaque 2013.  . Pulmonary nodule, right     Last scan in 2010 showing stability; felt to be benign.  . Cardiac defibrillator -dual  [facility]   . Atrial tachycardia-non sustained     a. Noted on 03/2012 interrogation.  Marland Kitchen A-fib     a. Noted on 12/2012 interrogation, placed on Apixaban.  Marland Kitchen PVC's (premature ventricular contractions)     PSHX: Past Surgical History  Procedure Laterality Date  . Colon surgery    . Cardiac catheterization  2000  . Icd  12/2011    Graciela Husbands  . Cardiac defibrillator placement      FAMHX: Family History  Problem Relation Age of Onset  . Emphysema Mother   . Heart disease Mother     AVR  . Heart failure Father    Died agwe 47    Social History:  reports that he quit smoking about 29 years ago. His smoking use included Cigarettes. He has a 100 pack-year smoking history. He has never used smokeless tobacco. He reports that he does not drink alcohol or use illicit drugs.  Allergies:  Allergies  Allergen Reactions  . Atorvastatin Other (See Comments)    cough    Medications: reviewed  Dg Chest 2 View  03/26/2013  *RADIOLOGY REPORT*  Clinical Data: Shortness of breath, nausea  CHEST - 2 VIEW  Comparison: 01/06/2013  Findings: Cardiomediastinal silhouette is unremarkable.  There is patchy infiltrate in the left base suspicious for pneumonia.  Dual lead cardiac pacemaker is unchanged in position. No pulmonary edema.  IMPRESSION: Patchy airspace disease in the left base suspicious for pneumonia. No pulmonary edema.   Original Report Authenticated By: Natasha Mead, M.D.     ROS  As stated in the HPI and negative for all other systems.  Physical Exam  Vitals:Blood pressure 108/78, pulse 61, temperature 97.4 F (36.3 C), temperature source Oral, resp. rate 20, height 6\' 1"  (1.854 m), weight 202 lb 4.8 oz (91.763 kg), SpO2 93.00%.  chroncially ill appearing, NAD HEENT: Unremarkable Neck:  No JVD, no thyromegally Lymphatics:  No adenopathy Back:  No CVA tenderness Lungs:  Clear HEART:  Regular rate rhythm, no murmurs, no rubs, no clicks Abd:  Flat, positive bowel sounds, no organomegally, no rebound, no guarding Ext:  2 plus pulses, no edema, no cyanosis, no clubbing Skin:  No rashes no nodules Neuro:  CN II through XII intact, motor grossly intact  Tele - atrial fib with mostly ventricular pacing  Assessment/Plan: 1. Worsening acute on chronic systolic CHF 2. Persistent atrial fibrillation 3. Intermittent ventricular pacing, ? How much 4. Worsening PVC's Rec: I would recommend uptitration of his amiodarone. He will need to undergo DCCV once he has been anti-coagulated for 3 weeks.   Sharlot Gowda  TaylorMD 03/27/2013, 4:03 PM   EP Addendum  I have reviewed ICD interrogation. His symptoms coincide with development of persistent atrial fibrillation.  He will need DCCV. He will need uptitration of his amiodarone. Leonia Reeves.D.

## 2013-03-28 ENCOUNTER — Inpatient Hospital Stay (HOSPITAL_COMMUNITY): Payer: Managed Care, Other (non HMO)

## 2013-03-28 DIAGNOSIS — I10 Essential (primary) hypertension: Secondary | ICD-10-CM

## 2013-03-28 LAB — BASIC METABOLIC PANEL
BUN: 19 mg/dL (ref 6–23)
Chloride: 105 mEq/L (ref 96–112)
Creatinine, Ser: 1.25 mg/dL (ref 0.50–1.35)
GFR calc non Af Amer: 55 mL/min — ABNORMAL LOW (ref >90)
Glucose, Bld: 102 mg/dL — ABNORMAL HIGH (ref 70–99)
Potassium: 4 mEq/L (ref 3.5–5.1)

## 2013-03-28 MED ORDER — PIPERACILLIN-TAZOBACTAM 3.375 G IVPB
3.3750 g | Freq: Three times a day (TID) | INTRAVENOUS | Status: DC
  Administered 2013-03-28 (×2): 3.375 g via INTRAVENOUS
  Filled 2013-03-28 (×4): qty 50

## 2013-03-28 MED ORDER — VANCOMYCIN HCL IN DEXTROSE 750-5 MG/150ML-% IV SOLN
750.0000 mg | Freq: Two times a day (BID) | INTRAVENOUS | Status: DC
  Filled 2013-03-28 (×2): qty 150

## 2013-03-28 MED ORDER — VANCOMYCIN HCL 10 G IV SOLR
1500.0000 mg | Freq: Once | INTRAVENOUS | Status: AC
  Administered 2013-03-28: 1500 mg via INTRAVENOUS
  Filled 2013-03-28: qty 1500

## 2013-03-28 MED ORDER — PSYLLIUM 95 % PO PACK
1.0000 | PACK | Freq: Every day | ORAL | Status: DC
  Administered 2013-03-28 – 2013-03-31 (×4): 1 via ORAL
  Filled 2013-03-28 (×5): qty 1

## 2013-03-28 MED ORDER — LEVOFLOXACIN IN D5W 750 MG/150ML IV SOLN
750.0000 mg | INTRAVENOUS | Status: DC
  Administered 2013-03-28 – 2013-03-30 (×3): 750 mg via INTRAVENOUS
  Filled 2013-03-28 (×4): qty 150

## 2013-03-28 NOTE — Progress Notes (Signed)
[facility] PROGRESS NOTE  Corey Tyler VHQ:[id number] DOB: 1941/03/30 DOA: 03/26/2013 PCP: Corey Grippe, MD  Assessment/Plan: 1. Acute on chronic systoic HF- Cardiology on board and managing at this juncture. EP has evaluated patient and recommends TEE DCCV 03/29/13 2. SOB-  Continue with supplemental oxygen.  Pt feels improved. Improved with lasix, improved heart rate control, and antibiotic.  Index of suspicion for infection is low however. 3. Atrial fib- Cardiologist managing. Considering starting amiodarone 200 mg q 8hr and apixaban for anticoagulation.   Code Status: full  Family Communication:  Discussed with patient and family at bedside.  Disposition Plan: Pending further recommendations from cardiologist   Consultants:  Cardiology: [place]  Procedures:  none  Antibiotics:  On Levaquin   HPI/Subjective: No acute issues reported overnight.    Objective: Filed Vitals:   03/28/13 1056 03/28/13 1105 03/28/13 1138 03/28/13 1400  BP: 89/56 89/56  95/65  Pulse: 73   74  Temp:    97.4 F (36.3 C)  TempSrc:    Oral  Resp: 18   18  Height:      Weight:      SpO2: 92%  93% 94%    Intake/Output Summary (Last 24 hours) at 03/28/13 1857 Last data filed at 03/28/13 1422  Gross per 24 hour  Intake   1510 ml  Output   2805 ml  Net  -1295 ml   Filed Weights   03/27/13 0500 03/27/13 0505 03/28/13 0603  Weight: 92.851 kg (204 lb 11.2 oz) 91.763 kg (202 lb 4.8 oz) 92.443 kg (203 lb 12.8 oz)    Exam:   General:  Pt in NAD, Alert and Awake  Cardiovascular: S1 and S2 wnl, no rubs  Respiratory: no wheezes, speaking in full sentences, no increased wob on room air  Abdomen: soft, NT, ND  Musculoskeletal:  No cyanosis or clubbing   Data Reviewed: Basic Metabolic Panel:  Recent Labs Lab 03/26/13 1139 03/27/13 0248 03/28/13 0509  NA 141 139 142  K 3.5 4.4 4.0  CL 104 101 105  CO2 29 29 29   GLUCOSE 133* 112* 102*  BUN 16 17 19   CREATININE 1.28 1.18 1.25   CALCIUM 8.9 8.8 8.6  MG 2.2  --   --    Liver Function Tests:  Recent Labs Lab 03/27/13 1004  AST 18  ALT 22  ALKPHOS 75  BILITOT 0.8  PROT 6.6  ALBUMIN 3.3*   No results found for this basename: LIPASE, AMYLASE,  in the last 168 hours No results found for this basename: AMMONIA,  in the last 168 hours CBC:  Recent Labs Lab 03/26/13 1139  WBC 7.4  HGB 14.0  HCT 40.1  MCV 89.5  PLT 166   Cardiac Enzymes:  Recent Labs Lab 03/26/13 1540 03/26/13 2037 03/27/13 0248  TROPONINI <0.30 <0.30 <0.30   BNP (last 3 results)  Recent Labs  03/26/13 1139 03/27/13 0248  PROBNP 3488.0* 2908.0*   CBG: No results found for this basename: GLUCAP,  in the last 168 hours  No results found for this or any previous visit (from the past 240 hour(s)).   Studies: Dg Chest Port 1 View  03/28/2013  *RADIOLOGY REPORT*  Clinical Data: 72 year old male with shortness of breath.  PORTABLE CHEST - 1 VIEW  Comparison: 03/26/2013 and earlier.  Findings: Portable semi upright AP view at 0218 hours.  Stable lung volumes.  Stable cardiac size and mediastinal contours.  Stable left chest cardiac pacemaker / AICD.  No pneumothorax.  Small left pleural effusion is stable.  Asymmetric mild streaky and reticulonodular density at the left lung base is stable or mildly increased.  IMPRESSION:  Stable to mildly increased patchy and reticulonodular left lung base opacity, remaining suspicious for pneumonia.  Stable small left effusion.   Original Report Authenticated By: Erskine Speed, M.D.     Scheduled Meds: . amiodarone  200 mg Oral Q8H  . apixaban  5 mg Oral BID  . carvedilol  6.25 mg Oral BID WC  . cholecalciferol  1,000 Units Oral Daily  . finasteride  5 mg Oral q morning - 10a  . furosemide  40 mg Intravenous Daily  . lisinopril  40 mg Oral Daily  . mometasone-formoterol  2 puff Inhalation BID  . multivitamin with minerals  1 tablet Oral Daily  . pantoprazole  40 mg Oral Daily  . potassium  chloride  20 mEq Oral Daily  . psyllium  1 packet Oral Daily  . simvastatin  20 mg Oral q1800  . sodium chloride  3 mL Intravenous Q12H  . tamsulosin  0.4 mg Oral Daily   Continuous Infusions:   Active Problems:   Atrial fibrillation   SOB (shortness of breath)   Acute on chronic systolic heart failure    Time spent: > 35 minutes    [name]  [facility] Pager [phone]. If 7PM-7AM, please contact night-coverage at [email or web address], password [facility] 03/28/2013, 6:57 PM  LOS: 2 days

## 2013-03-28 NOTE — Progress Notes (Signed)
ANTIBIOTIC CONSULT NOTE - INITIAL  Pharmacy Consult for vancomycin, Zosyn Indication: rule out pneumonia  Allergies  Allergen Reactions  . Atorvastatin Other (See Comments)    cough    Patient Measurements: Height: 6\' 1"  (185.4 cm) Weight: 202 lb 4.8 oz (91.763 kg) (had pt remove jeans: explained reasoning) IBW/kg (Calculated) : 79.9  Vital Signs: Temp: 97.4 F (36.3 C) (05/06 0111) Temp src: Oral (05/06 0111) BP: 120/73 mmHg (05/06 0111) Pulse Rate: 90 (05/06 0111) Intake/Output from previous day: 05/05 0701 - 05/06 0700 In: 720 [P.O.:720] Out: 2055 [Urine:2055] Intake/Output from this shift: Total I/O In: -  Out: 125 [Urine:125]  Labs:  Recent Labs  03/26/13 1139 03/27/13 0248  WBC 7.4  --   HGB 14.0  --   PLT 166  --   CREATININE 1.28 1.18   Estimated Creatinine Clearance: 63 ml/min (by C-G formula based on Cr of 1.18). No results found for this basename: VANCOTROUGH, VANCOPEAK, VANCORANDOM, GENTTROUGH, GENTPEAK, GENTRANDOM, TOBRATROUGH, TOBRAPEAK, TOBRARND, AMIKACINPEAK, AMIKACINTROU, AMIKACIN,  in the last 72 hours   Microbiology: No results found for this or any previous visit (from the past 720 hour(s)).  Medical History: Past Medical History  Diagnosis Date  . Old myocardial infarction     a. ?Silent MI. b. Large fixed inferolateral defect c/w with prior infarct, no ischemia. c. Cath in 2000/2013 with only minimal CAD.  Marland Kitchen Hypertension   . COPD (chronic obstructive pulmonary disease)   . Hyperlipidemia   . Chronic systolic heart failure     EF down to 20 to 25% per echo November 2012  . NICM (nonischemic cardiomyopathy)     a. Normal cors 2000. b. Minimal plaque 2013.  . Pulmonary nodule, right     Last scan in 2010 showing stability; felt to be benign.  . Cardiac defibrillator -dual  [facility]   . Atrial tachycardia-non sustained     a. Noted on 03/2012 interrogation.  Marland Kitchen A-fib     a. Noted on 12/2012 interrogation, placed on Apixaban.  Marland Kitchen PVC's  (premature ventricular contractions)     Medications:  Scheduled:  . amiodarone  200 mg Oral Q8H  . apixaban  5 mg Oral BID  . carvedilol  6.25 mg Oral BID WC  . cholecalciferol  1,000 Units Oral Daily  . finasteride  5 mg Oral q morning - 10a  . furosemide  40 mg Intravenous Daily  . lisinopril  40 mg Oral Daily  . mometasone-formoterol  2 puff Inhalation BID  . multivitamin with minerals  1 tablet Oral Daily  . pantoprazole  40 mg Oral Daily  . potassium chloride  20 mEq Oral Daily  . simvastatin  20 mg Oral q1800  . sodium chloride  3 mL Intravenous Q12H  . tamsulosin  0.4 mg Oral Daily  . [DISCONTINUED] apixaban  2.5 mg Oral BID   Assessment: 72 yo male admitted with acute on chronic heart failure. Pharmacy to manage Zosyn and vancomycin for possible pneumonia.   Goal of Therapy:  Vancomycin trough level 15-20 mcg/ml  Plan:  1. Zosyn 3.375gm IV Q8H (4 hr infusion) 2. Vancomycin 1.5gm IV x 1, then 750 gm IV Q12H.   Emeline Gins 03/28/2013,2:46 AM

## 2013-03-28 NOTE — Progress Notes (Signed)
Pt complain of SOB, pt states that he needs levaquin. Pt O2 sat 95% on RA. Pt also nauseous. Prn Zofran given. MD notified of SOB and request for levaquin. New order for chest xray at bedside. RN holding 2AM amiodarone dose until chest xray complete. Baron Hamper, RN 03/28/2013 1:38 AM

## 2013-03-28 NOTE — Progress Notes (Signed)
ANTIBIOTIC CONSULT NOTE - INITIAL  Pharmacy Consult for Levaquin Indication: R/O pneumonia   Allergies  Allergen Reactions  . Atorvastatin Other (See Comments)    cough    Patient Measurements: Height: 6\' 1"  (185.4 cm) Weight: 203 lb 12.8 oz (92.443 kg) (scale C) IBW/kg (Calculated) : 79.9  Vital Signs: Temp: 97.4 F (36.3 C) (05/06 1400) Temp src: Oral (05/06 1400) BP: 95/65 mmHg (05/06 1400) Pulse Rate: 74 (05/06 1400) Intake/Output from previous day: 05/05 0701 - 05/06 0700 In: 1510 [P.O.:960; IV Piggyback:550] Out: 2305 [Urine:2305] Intake/Output from this shift:    Labs:  Recent Labs  03/26/13 1139 03/27/13 0248 03/28/13 0509  WBC 7.4  --   --   HGB 14.0  --   --   PLT 166  --   --   CREATININE 1.28 1.18 1.25   Estimated Creatinine Clearance: 59.5 ml/min (by C-G formula based on Cr of 1.25). No results found for this basename: VANCOTROUGH, VANCOPEAK, VANCORANDOM, GENTTROUGH, GENTPEAK, GENTRANDOM, TOBRATROUGH, TOBRAPEAK, TOBRARND, AMIKACINPEAK, AMIKACINTROU, AMIKACIN,  in the last 72 hours   Microbiology: No results found for this or any previous visit (from the past 720 hour(s)).  Medical History: Past Medical History  Diagnosis Date  . Old myocardial infarction     a. ?Silent MI. b. Large fixed inferolateral defect c/w with prior infarct, no ischemia. c. Cath in 2000/2013 with only minimal CAD.  Marland Kitchen Hypertension   . COPD (chronic obstructive pulmonary disease)   . Hyperlipidemia   . Chronic systolic heart failure     EF down to 20 to 25% per echo November 2012  . NICM (nonischemic cardiomyopathy)     a. Normal cors 2000. b. Minimal plaque 2013.  . Pulmonary nodule, right     Last scan in 2010 showing stability; felt to be benign.  . Cardiac defibrillator -dual  [facility]   . Atrial tachycardia-non sustained     a. Noted on 03/2012 interrogation.  Marland Kitchen A-fib     a. Noted on 12/2012 interrogation, placed on Apixaban.  Marland Kitchen PVC's (premature ventricular  contractions)    Assessment: 72 yo male admitted with acute on chronic heart failure, and started vancomycin and  zosyn this morning for possible pneumonia. Patient is afebrile, wbc wnl, chest X-ray suspicious for pneumonia, Per MD request, will narrow abx to levaquin. Scr 1.25, est. crcl ~ 60 ml/min.    Goal of Therapy:  Appropriate antibiotic dosing  Plan:  - Start Levaquin 750 mg IV q 24 hrs - f/u renal function, clinical improvement and LOT - Transition to PO in the next 24 to 48 hrs if clinically improves.    Bayard Hugger, PharmD, BCPS  Clinical Pharmacist  Pager: [phone]   03/28/2013,7:05 PM

## 2013-03-28 NOTE — Progress Notes (Signed)
03/28/13 1130 In to complete Heart Failure Home Health Screen.  Pt. states he is able to take care of hisself, with the asistance of his spouse.  He works at [employer] currently.  He does not want home health at this time.  Pt. states he will have a TEE with cardioversion tomorrow.   NCM will follow for further dc needs.  Tera Mater, RN, BSN NCM [phone]

## 2013-03-28 NOTE — Progress Notes (Signed)
1120 bp low  89/.56. Lucile Crater aware  Lisinopril not given as given as ordered

## 2013-03-28 NOTE — Progress Notes (Signed)
Patient: Corey Tyler Date of Encounter: 03/28/2013, 8:33 AM Admit date: 03/26/2013     Subjective  Corey Tyler reports he is feeling much better. His SOB is improving. He denies CP or palpitations.   Objective  Physical Exam: Vitals: BP 102/63  Pulse 75  Temp(Src) 97.8 F (36.6 C) (Oral)  Resp 18  Ht 6\' 1"  (1.854 m)  Wt 203 lb 12.8 oz (92.443 kg)  BMI 26.89 kg/m2  SpO2 92% General: Well developed, well appearing 72 year old male in no acute distress. Neck: Supple. JVD not elevated. Lungs: Clear bilaterally to auscultation without wheezes, rales, or rhonchi. Breathing is unlabored. Heart: Irregularly irregular S1 S2 without murmurs, rubs, or gallops.  Abdomen: Soft, non-distended. Extremities: No clubbing or cyanosis. No edema.  Distal pedal pulses are 2+ and equal bilaterally. Neuro: Alert and oriented X 3. Moves all extremities spontaneously. No focal deficits.  Intake/Output:  Intake/Output Summary (Last 24 hours) at 03/28/13 0833 Last data filed at 03/28/13 0756  Gross per 24 hour  Intake   1870 ml  Output   2305 ml  Net   -435 ml    Inpatient Medications:  . amiodarone  200 mg Oral Q8H  . apixaban  5 mg Oral BID  . carvedilol  6.25 mg Oral BID WC  . cholecalciferol  1,000 Units Oral Daily  . finasteride  5 mg Oral q morning - 10a  . furosemide  40 mg Intravenous Daily  . lisinopril  40 mg Oral Daily  . mometasone-formoterol  2 puff Inhalation BID  . multivitamin with minerals  1 tablet Oral Daily  . pantoprazole  40 mg Oral Daily  . piperacillin-tazobactam (ZOSYN)  IV  3.375 g Intravenous Q8H  . potassium chloride  20 mEq Oral Daily  . simvastatin  20 mg Oral q1800  . sodium chloride  3 mL Intravenous Q12H  . tamsulosin  0.4 mg Oral Daily  . vancomycin  750 mg Intravenous Q12H    Labs:  Recent Labs  03/26/13 1139 03/27/13 0248 03/28/13 0509  NA 141 139 142  K 3.5 4.4 4.0  CL 104 101 105  CO2 29 29 29   GLUCOSE 133* 112* 102*  BUN 16 17 19     CREATININE 1.28 1.18 1.25  CALCIUM 8.9 8.8 8.6  MG 2.2  --   --     Recent Labs  03/27/13 1004  AST 18  ALT 22  ALKPHOS 75  BILITOT 0.8  PROT 6.6  ALBUMIN 3.3*    Recent Labs  03/26/13 1139  WBC 7.4  HGB 14.0  HCT 40.1  MCV 89.5  PLT 166    Recent Labs  03/26/13 1540 03/26/13 2037 03/27/13 0248  TROPONINI <0.30 <0.30 <0.30    Recent Labs  03/27/13 1004  HGBA1C 6.0*    Recent Labs  03/27/13 0248  TSH 4.600*    Recent Labs  03/26/13 1152  INR 1.18    Radiology/Studies: Dg Chest Port 1 View  03/28/2013  *RADIOLOGY REPORT*  Clinical Data: 72 year old male with shortness of breath.  PORTABLE CHEST - 1 VIEW  Comparison: 03/26/2013 and earlier.  Findings: Portable semi upright AP view at 0218 hours.  Stable lung volumes.  Stable cardiac size and mediastinal contours.  Stable left chest cardiac pacemaker / AICD.  No pneumothorax.  Small left pleural effusion is stable.  Asymmetric mild streaky and reticulonodular density at the left lung base is stable or mildly increased.  IMPRESSION:  Stable to mildly  increased patchy and reticulonodular left lung base opacity, remaining suspicious for pneumonia.  Stable small left effusion.   Original Report Authenticated By: Erskine Speed, M.D.     Telemetry: persistent AF with improved rated over last 12-24 hours    Assessment and Plan  1. Acute on chronic systolic CHF  2. Persistent atrial fibrillation  3. PVCs Mr. Macon HF symptoms correlate with AF on device interrogation yesterday. Continue amiodarone and plan for DCCV once adequately anticoagulated, now on Eliquis.  Dr. Ladona Ridgel to see Signed, Exie Parody  EP Attending  Patient seen and examined. He can undergo TEE DCCV tomorrow. He will need to continue Eliquis. Will try to arrange.  Leonia Reeves.D.

## 2013-03-28 NOTE — Plan of Care (Signed)
Problem: Phase I Progression Outcomes Goal: EF % per last Echo/documented,Core Reminder form on chart Outcome: Completed/Met Date Met:  03/28/13 Pt EF 15% per echo on 12/13/12

## 2013-03-28 NOTE — Progress Notes (Signed)
Received call from nurse regarding BP 89/56. Lasix has been given already but there is question of lisinopril coming due. Will hold lisinopril for this AM. If BP remains low throughout the day/tomorrow, consider changing future dose. Dayna Dunn PA-C

## 2013-03-29 DIAGNOSIS — J189 Pneumonia, unspecified organism: Secondary | ICD-10-CM | POA: Diagnosis present

## 2013-03-29 LAB — BASIC METABOLIC PANEL
BUN: 21 mg/dL (ref 6–23)
Calcium: 9 mg/dL (ref 8.4–10.5)
GFR calc non Af Amer: 51 mL/min — ABNORMAL LOW (ref >90)
Glucose, Bld: 99 mg/dL (ref 70–99)
Sodium: 137 mEq/L (ref 135–145)

## 2013-03-29 LAB — CBC
MCH: 31.3 pg (ref 26.0–34.0)
MCHC: 34.9 g/dL (ref 30.0–36.0)
Platelets: 165 10*3/uL (ref 150–400)
RBC: 4.31 MIL/uL (ref 4.22–5.81)
RDW: 14.3 % (ref 11.5–15.5)

## 2013-03-29 MED ORDER — SODIUM CHLORIDE 0.9 % IV SOLN
INTRAVENOUS | Status: DC
  Administered 2013-03-29: 10 mL/h via INTRAVENOUS

## 2013-03-29 NOTE — Progress Notes (Signed)
Patient with desat to 89% sustaining. Oxygen applied at 3L via [place].  Current O2 sat = 93%. Patient asymptomatic and resting comfortably at bedside.  RN will continue to monitor. Louretta Parma, RN

## 2013-03-29 NOTE — Progress Notes (Signed)
Patient: Corey Tyler Date of Encounter: 03/29/2013, 7:51 AM Admit date: 03/26/2013     Subjective  Corey Tyler reports SOB but otherwise has no complaints. He denies CP or palpitations.   Objective  Physical Exam: Vitals: BP 103/70  Pulse 95  Temp(Src) 97.8 F (36.6 C) (Oral)  Resp 18  Ht 6\' 1"  (1.854 m)  Wt 202 lb 12.8 oz (91.989 kg)  BMI 26.76 kg/m2  SpO2 94% General: Well developed, well appearing 72 year old male in no acute distress. Neck: Supple. JVD not elevated. Lungs: Clear bilaterally to auscultation without wheezes, rales, or rhonchi. Breathing is unlabored. Heart: RRR S1 S2 without murmurs, rubs, or gallops.  Abdomen: Soft, non-distended. Extremities: No clubbing or cyanosis. No edema.  Distal pedal pulses are 2+ and equal bilaterally. Neuro: Alert and oriented X 3. Moves all extremities spontaneously. No focal deficits.  Intake/Output:  Intake/Output Summary (Last 24 hours) at 03/29/13 0751 Last data filed at 03/29/13 0559  Gross per 24 hour  Intake   1080 ml  Output   3430 ml  Net  -2350 ml    Inpatient Medications:  . amiodarone  200 mg Oral Q8H  . apixaban  5 mg Oral BID  . carvedilol  6.25 mg Oral BID WC  . cholecalciferol  1,000 Units Oral Daily  . finasteride  5 mg Oral q morning - 10a  . furosemide  40 mg Intravenous Daily  . levofloxacin (LEVAQUIN) IV  750 mg Intravenous Q24H  . lisinopril  40 mg Oral Daily  . mometasone-formoterol  2 puff Inhalation BID  . multivitamin with minerals  1 tablet Oral Daily  . pantoprazole  40 mg Oral Daily  . potassium chloride  20 mEq Oral Daily  . psyllium  1 packet Oral Daily  . simvastatin  20 mg Oral q1800  . sodium chloride  3 mL Intravenous Q12H  . tamsulosin  0.4 mg Oral Daily    Labs:  Recent Labs  03/26/13 1139  03/28/13 0509 03/29/13 0532  NA 141  < > 142 137  K 3.5  < > 4.0 4.0  CL 104  < > 105 103  CO2 29  < > 29 27  GLUCOSE 133*  < > 102* 99  BUN 16  < > 19 21  CREATININE 1.28  <  > 1.25 1.33  CALCIUM 8.9  < > 8.6 9.0  MG 2.2  --   --   --   < > = values in this interval not displayed.  Recent Labs  03/27/13 1004  AST 18  ALT 22  ALKPHOS 75  BILITOT 0.8  PROT 6.6  ALBUMIN 3.3*    Recent Labs  03/26/13 1139 03/29/13 0532  WBC 7.4 8.2  HGB 14.0 13.5  HCT 40.1 38.7*  MCV 89.5 89.8  PLT 166 165    Recent Labs  03/26/13 1540 03/26/13 2037 03/27/13 0248  TROPONINI <0.30 <0.30 <0.30    Recent Labs  03/27/13 1004  HGBA1C 6.0*    Recent Labs  03/27/13 0248  TSH 4.600*    Recent Labs  03/26/13 1152  INR 1.18    Radiology/Studies: Dg Chest Port 1 View  03/28/2013  *RADIOLOGY REPORT*  Clinical Data: 72 year old male with shortness of breath.  PORTABLE CHEST - 1 VIEW  Comparison: 03/26/2013 and earlier.  Findings: Portable semi upright AP view at 0218 hours.  Stable lung volumes.  Stable cardiac size and mediastinal contours.  Stable left chest cardiac  pacemaker / AICD.  No pneumothorax.  Small left pleural effusion is stable.  Asymmetric mild streaky and reticulonodular density at the left lung base is stable or mildly increased.  IMPRESSION:  Stable to mildly increased patchy and reticulonodular left lung base opacity, remaining suspicious for pneumonia.  Stable small left effusion.   Original Report Authenticated By: Erskine Speed, M.D.     Telemetry: persistent AF, currently rate controlled    Assessment and Plan  1. Acute on chronic systolic CHF  2. Persistent atrial fibrillation - for TEE-guided DCCV. Note anesthesia unavailable today. Will continue amiodarone, BB and Eliquis 3. PVCs 4. Probable PNA - abnormal CXR with hypoxia last night and this AM; management per primary team. Any pneumonia will make the likelihood of maintaining NSR less but clinical benefit worth proceeding. Hopefully he will maintain NSR on higher dose of amio.   [name]

## 2013-03-29 NOTE — Progress Notes (Signed)
[facility] PROGRESS NOTE  Corey Tyler ZOX:[id number] DOB: [birth year] DOA: 03/26/2013 PCP: [name], MD  Brief narrative 72 year old male with history of CAD, severe cardiomyopathy with EF of 15%, chronic A. fib presented with worsening shortness of breath for past few days with increasing weight. Patient found to be in A. fib with RVR as well. Also noted for possible pneumonia on chest x-ray. Patient admitted for acute on chronic systolic CHF with associated pneumonia.  Assessment/Plan: Acute on chronic systoic heart failure diuresing with  IV Lasix 40 mg daily. Continue lisinopril and coreg.  Monitor I/O.  Continue supplemental oxygen. Negative by 5.2 L since admission  ? Community acquired pneumonia Treating with Levaquin.  Persistent A. fib Started on amiodarone. Continue Coreg for rate control. On eliquis as outpt HR controlled most of the times. Plan on TEE with DCCV. Likely tomorrow.   Code Status: Full code Family Communication: None at bedside Disposition Plan: Home once stable   Consultants:  [facility]  Procedures:  TEE with DCCV planned for 5/8  Antibiotics:  IV Levaquin (5/4>>)  HPI/Subjective: Patient seen and examined this morning. He seems unhappy as he did not get cardioversion today. Informed shortness of breath to be better.  Objective: Filed Vitals:   03/28/13 1400 03/28/13 2151 03/29/13 0120 03/29/13 0659  BP: 95/65  106/76 103/70  Pulse: 74  74 95  Temp: 97.4 F (36.3 C)  97.6 F (36.4 C) 97.8 F (36.6 C)  TempSrc: Oral  Oral Oral  Resp: 18  19 18   Height:      Weight:    91.989 kg (202 lb 12.8 oz)  SpO2: 94% 91% 92% 94%    Intake/Output Summary (Last 24 hours) at 03/29/13 1253 Last data filed at 03/29/13 1025  Gross per 24 hour  Intake    840 ml  Output   3100 ml  Net  -2260 ml   Filed Weights   03/27/13 0505 03/28/13 0603 03/29/13 0659  Weight: 91.763 kg (202 lb 4.8 oz) 92.443 kg (203 lb 12.8 oz) 91.989 kg  (202 lb 12.8 oz)    Exam:   General:  Elderly male in no acute distress  HEENT: No pallor, moist oral mucosa  Chest: Equal air entry bilaterally, coarse crackles at lung bases  CVS: S1 and S2 irregular, no murmurs rub or gallop  Abdomen: Soft, nontender, nondistended, bowel sounds present  Extremities: warm, no edema  CNS: AAO x3    Data Reviewed: Basic Metabolic Panel:  Recent Labs Lab 03/26/13 1139 03/27/13 0248 03/28/13 0509 03/29/13 0532  NA 141 139 142 137  K 3.5 4.4 4.0 4.0  CL 104 101 105 103  CO2 29 29 29 27   GLUCOSE 133* 112* 102* 99  BUN 16 17 19 21   CREATININE 1.28 1.18 1.25 1.33  CALCIUM 8.9 8.8 8.6 9.0  MG 2.2  --   --   --    Liver Function Tests:  Recent Labs Lab 03/27/13 1004  AST 18  ALT 22  ALKPHOS 75  BILITOT 0.8  PROT 6.6  ALBUMIN 3.3*   No results found for this basename: LIPASE, AMYLASE,  in the last 168 hours No results found for this basename: AMMONIA,  in the last 168 hours CBC:  Recent Labs Lab 03/26/13 1139 03/29/13 0532  WBC 7.4 8.2  HGB 14.0 13.5  HCT 40.1 38.7*  MCV 89.5 89.8  PLT 166 165   Cardiac Enzymes:  Recent Labs Lab 03/26/13 1540 03/26/13 2037 03/27/13 0248  TROPONINI <0.30 <0.30 <0.30   BNP (last 3 results)  Recent Labs  03/26/13 1139 03/27/13 0248  PROBNP 3488.0* 2908.0*   CBG: No results found for this basename: GLUCAP,  in the last 168 hours  No results found for this or any previous visit (from the past 240 hour(s)).   Studies: Dg Chest Port 1 View  03/28/2013  *RADIOLOGY REPORT*  Clinical Data: 72 year old male with shortness of breath.  PORTABLE CHEST - 1 VIEW  Comparison: 03/26/2013 and earlier.  Findings: Portable semi upright AP view at 0218 hours.  Stable lung volumes.  Stable cardiac size and mediastinal contours.  Stable left chest cardiac pacemaker / AICD.  No pneumothorax.  Small left pleural effusion is stable.  Asymmetric mild streaky and reticulonodular density at the left  lung base is stable or mildly increased.  IMPRESSION:  Stable to mildly increased patchy and reticulonodular left lung base opacity, remaining suspicious for pneumonia.  Stable small left effusion.   Original Report Authenticated By: Erskine Speed, M.D.     Scheduled Meds: . amiodarone  200 mg Oral Q8H  . apixaban  5 mg Oral BID  . carvedilol  6.25 mg Oral BID WC  . cholecalciferol  1,000 Units Oral Daily  . finasteride  5 mg Oral q morning - 10a  . furosemide  40 mg Intravenous Daily  . levofloxacin (LEVAQUIN) IV  750 mg Intravenous Q24H  . lisinopril  40 mg Oral Daily  . mometasone-formoterol  2 puff Inhalation BID  . multivitamin with minerals  1 tablet Oral Daily  . pantoprazole  40 mg Oral Daily  . potassium chloride  20 mEq Oral Daily  . psyllium  1 packet Oral Daily  . simvastatin  20 mg Oral q1800  . sodium chloride  3 mL Intravenous Q12H  . tamsulosin  0.4 mg Oral Daily   Continuous Infusions: . sodium chloride 10 mL/hr (03/29/13 1231)      Time spent: 25 minutes    Celestine Bougie  [facility] Pager [phone] If 7PM-7AM, please contact night-coverage at [email or web address], password [facility] 03/29/2013, 12:53 PM  LOS: 3 days

## 2013-03-30 ENCOUNTER — Encounter (HOSPITAL_COMMUNITY)
Admission: EM | Disposition: A | Discharge status: Home / Self Care | Payer: Self-pay | Source: Home / Self Care | Attending: Family Medicine

## 2013-03-30 ENCOUNTER — Encounter (HOSPITAL_COMMUNITY): Payer: Self-pay | Admitting: *Deleted

## 2013-03-30 DIAGNOSIS — I4891 Unspecified atrial fibrillation: Secondary | ICD-10-CM

## 2013-03-30 HISTORY — PX: CARDIOVERSION: SHX1299

## 2013-03-30 HISTORY — PX: TEE WITHOUT CARDIOVERSION: SHX5443

## 2013-03-30 SURGERY — ECHOCARDIOGRAM, TRANSESOPHAGEAL
Anesthesia: Moderate Sedation

## 2013-03-30 MED ORDER — FENTANYL CITRATE 0.05 MG/ML IJ SOLN
INTRAMUSCULAR | Status: DC | PRN
  Administered 2013-03-30: 50 ug via INTRAVENOUS

## 2013-03-30 MED ORDER — BUTAMBEN-TETRACAINE-BENZOCAINE 2-2-14 % EX AERO
INHALATION_SPRAY | CUTANEOUS | Status: DC | PRN
  Administered 2013-03-30: 2 via TOPICAL

## 2013-03-30 MED ORDER — MIDAZOLAM HCL 5 MG/ML IJ SOLN
INTRAMUSCULAR | Status: AC
  Filled 2013-03-30: qty 2

## 2013-03-30 MED ORDER — MIDAZOLAM HCL 10 MG/2ML IJ SOLN
INTRAMUSCULAR | Status: DC | PRN
  Administered 2013-03-30: 2 mg via INTRAVENOUS
  Administered 2013-03-30 (×2): 1 mg via INTRAVENOUS

## 2013-03-30 MED ORDER — FENTANYL CITRATE 0.05 MG/ML IJ SOLN
INTRAMUSCULAR | Status: AC
  Filled 2013-03-30: qty 2

## 2013-03-30 MED ORDER — METOPROLOL TARTRATE 1 MG/ML IV SOLN
INTRAVENOUS | Status: AC
  Filled 2013-03-30: qty 5

## 2013-03-30 MED ORDER — HYDROCORTISONE 1 % EX CREA
TOPICAL_CREAM | Freq: Four times a day (QID) | CUTANEOUS | Status: DC
  Administered 2013-03-30 (×2): via TOPICAL
  Filled 2013-03-30: qty 28

## 2013-03-30 NOTE — Progress Notes (Addendum)
Pt had 6 beat run of VT. Pt was sleeping, Asym. Will continue to monitor. Page PA, PA Alinda Money aware

## 2013-03-30 NOTE — H&P (View-Only) (Signed)
   SUBJECTIVE:  Breathing better but not perhaps at baseline.     PHYSICAL EXAM Filed Vitals:   03/29/13 1423 03/29/13 1843 03/29/13 2210 03/30/13 0455  BP: 103/51 119/67 96/62 102/63  Pulse: 95 95 70 77  Temp: 97.3 F (36.3 C)  98 F (36.7 C) 97.3 F (36.3 C)  TempSrc: Oral  Oral Oral  Resp: 20  18 20  Height:      Weight:    203 lb 4.2 oz (92.2 kg)  SpO2: 91%  95% 95%   General:  No acute distress Lungs:  Crackles at the left base Heart:  Irregular Abdomen:  Positive bowel sounds, no rebound no guarding Extremities:  Positive bowel sounds, no rebound no guarding  LABS:  No results found for this or any previous visit (from the past 24 hour(s)).  Intake/Output Summary (Last 24 hours) at 03/30/13 0711 Last data filed at 03/30/13 0500  Gross per 24 hour  Intake    840 ml  Output   2300 ml  Net  -1460 ml    ASSESSMENT AND PLAN:  ATRIAL FIB:  On amiodarone.  TEE DCCV today.   ACUTE ON CHRONIC SYSTOLIC AND DIASTOLIC HF:  6.8 liters negative since admission.   Still with crackles.  Continue IV Lasix today.   Corey Tyler 03/30/2013 7:11 AM   

## 2013-03-30 NOTE — Progress Notes (Signed)
   SUBJECTIVE:  Breathing better but not perhaps at baseline.     PHYSICAL EXAM Filed Vitals:   03/29/13 1423 03/29/13 1843 03/29/13 2210 03/30/13 0455  BP: 103/51 119/67 96/62 102/63  Pulse: 95 95 70 77  Temp: 97.3 F (36.3 C)  98 F (36.7 C) 97.3 F (36.3 C)  TempSrc: Oral  Oral Oral  Resp: 20  18 20   Height:      Weight:    203 lb 4.2 oz (92.2 kg)  SpO2: 91%  95% 95%   General:  No acute distress Lungs:  Crackles at the left base Heart:  Irregular Abdomen:  Positive bowel sounds, no rebound no guarding Extremities:  Positive bowel sounds, no rebound no guarding  LABS:  No results found for this or any previous visit (from the past 24 hour(s)).  Intake/Output Summary (Last 24 hours) at 03/30/13 0711 Last data filed at 03/30/13 0500  Gross per 24 hour  Intake    840 ml  Output   2300 ml  Net  -1460 ml    ASSESSMENT AND PLAN:  ATRIAL FIB:  On amiodarone.  TEE DCCV today.   ACUTE ON CHRONIC SYSTOLIC AND DIASTOLIC HF:  6.8 liters negative since admission.   Still with crackles.  Continue IV Lasix today.   Fayrene Fearing [facility] 03/30/2013 7:11 AM

## 2013-03-30 NOTE — Interval H&P Note (Signed)
History and Physical Interval Note:  03/30/2013 10:06 AM  Corey Tyler  has presented today for surgery, with the diagnosis of afib  The various methods of treatment have been discussed with the patient and family. After consideration of risks, benefits and other options for treatment, the patient has consented to  Procedure(s): TRANSESOPHAGEAL ECHOCARDIOGRAM (TEE) (N/A) CARDIOVERSION as a surgical intervention .  The patient's history has been reviewed, patient examined, no change in status, stable for surgery.  I have reviewed the patient's chart and labs.  Questions were answered to the patient's satisfaction.     Elyn Aquas.

## 2013-03-30 NOTE — Progress Notes (Signed)
[facility] PROGRESS NOTE  BABY [facility] ZOX:[id number] DOB: 01/02/1941 DOA: 03/26/2013 PCP: [name], MD  Brief narrative  72 year old male with history of CAD, severe cardiomyopathy with EF of 15%, chronic A. fib presented with worsening shortness of breath for past few days with increasing weight. Patient found to be in A. fib with RVR as well. Also noted for possible pneumonia on chest x-ray. Patient admitted for acute on chronic systolic CHF with associated pneumonia.   Assessment/Plan:  Acute on chronic systoic heart failure  diuresing with IV Lasix 40 mg daily. Continue lisinopril and coreg. Monitor I/O.  Continue supplemental oxygen.  Negative by almost 8 L since admission   ? Community acquired pneumonia  Treating with Levaquin. ( day 4/5)  Persistent A. fib  Started on amiodarone. Continue Coreg for rate control. On eliquis as outpt   TEE with DCCV done today. Now in sinus  Code Status: Full code  Family Communication: wife at bedside  Disposition Plan: Home tomorrow if SOB improved  Consultants:  [facility] Procedures:  TEE with DCCV on  5/8 Antibiotics:  IV Levaquin (5/4>>)   HPI/Subjective: Patient seen and examined after TEE. Feels better  Objective: Filed Vitals:   03/30/13 1100 03/30/13 1129 03/30/13 1315 03/30/13 1426  BP: 103/58 109/69 109/68   Pulse:  65 63   Temp:   97.7 F (36.5 C)   TempSrc:   Oral   Resp: 15 16 20 18   Height:      Weight:      SpO2: 93% 98% 96% 96%    Intake/Output Summary (Last 24 hours) at 03/30/13 1447 Last data filed at 03/30/13 1400  Gross per 24 hour  Intake 977.83 ml  Output   2775 ml  Net -1797.17 ml   Filed Weights   03/28/13 0603 03/29/13 0659 03/30/13 0455  Weight: 92.443 kg (203 lb 12.8 oz) 91.989 kg (202 lb 12.8 oz) 92.2 kg (203 lb 4.2 oz)    Exam:  General: Elderly male in no acute distress  HEENT: No pallor, moist oral mucosa  Chest: Equal air entry bilaterally, coarse crackles at  lung bases  CVS: S1 and S2 regular, no murmurs rub or gallop  Abdomen: Soft, nontender, nondistended, bowel sounds present  Extremities: warm, no edema CNS: AAO x3     Data Reviewed: Basic Metabolic Panel:  Recent Labs Lab 03/26/13 1139 03/27/13 0248 03/28/13 0509 03/29/13 0532  NA 141 139 142 137  K 3.5 4.4 4.0 4.0  CL 104 101 105 103  CO2 29 29 29 27   GLUCOSE 133* 112* 102* 99  BUN 16 17 19 21   CREATININE 1.28 1.18 1.25 1.33  CALCIUM 8.9 8.8 8.6 9.0  MG 2.2  --   --   --    Liver Function Tests:  Recent Labs Lab 03/27/13 1004  AST 18  ALT 22  ALKPHOS 75  BILITOT 0.8  PROT 6.6  ALBUMIN 3.3*   No results found for this basename: LIPASE, AMYLASE,  in the last 168 hours No results found for this basename: AMMONIA,  in the last 168 hours CBC:  Recent Labs Lab 03/26/13 1139 03/29/13 0532  WBC 7.4 8.2  HGB 14.0 13.5  HCT 40.1 38.7*  MCV 89.5 89.8  PLT 166 165   Cardiac Enzymes:  Recent Labs Lab 03/26/13 1540 03/26/13 2037 03/27/13 0248  TROPONINI <0.30 <0.30 <0.30   BNP (last 3 results)  Recent Labs  03/26/13 1139 03/27/13 0248  PROBNP 3488.0* 2908.0*  CBG: No results found for this basename: GLUCAP,  in the last 168 hours  No results found for this or any previous visit (from the past 240 hour(s)).   Studies: No results found.  Scheduled Meds: . amiodarone  200 mg Oral Q8H  . apixaban  5 mg Oral BID  . carvedilol  6.25 mg Oral BID WC  . cholecalciferol  1,000 Units Oral Daily  . finasteride  5 mg Oral q morning - 10a  . furosemide  40 mg Intravenous Daily  . hydrocortisone cream   Topical QID  . levofloxacin (LEVAQUIN) IV  750 mg Intravenous Q24H  . lisinopril  40 mg Oral Daily  . mometasone-formoterol  2 puff Inhalation BID  . multivitamin with minerals  1 tablet Oral Daily  . pantoprazole  40 mg Oral Daily  . potassium chloride  20 mEq Oral Daily  . psyllium  1 packet Oral Daily  . simvastatin  20 mg Oral q1800  . sodium  chloride  3 mL Intravenous Q12H  . tamsulosin  0.4 mg Oral Daily   Continuous Infusions:     Time spent: 25 minutes    Kalista Laguardia  [facility] Pager [phone] If 7PM-7AM, please contact night-coverage at [email or web address], password [email or web address] 03/30/2013, 2:47 PM  LOS: 4 days

## 2013-03-30 NOTE — CV Procedure (Signed)
    Transesophageal Echocardiogram Note  TRAMAINE SAULS [id number] 02/25/1941  Procedure: Transesophageal Echocardiogram Indications: atrial Fib  Procedure Details Consent: Obtained Time Out: Verified patient identification, verified procedure, site/side was marked, verified correct patient position, special equipment/implants available, Radiology Safety Procedures followed,  medications/allergies/relevent history reviewed, required imaging and test results available.  Performed  Medications: Fentanyl: 50 mcg IV Versed: 4 mg IV  Left Ventrical:  Marked LV dysfunction , EF 25%  Mitral Valve: mild MR  Aortic Valve: normal AV, trivial AI  Tricuspid Valve: normal   Pulmonic Valve: trivial PI  Left Atrium/ Left atrial appendage: no LAA clot.   Atrial septum: normal  Aorta: mild calcification   Complications: No apparent complications Patient did tolerate procedure well.  We started the cardioversion procedure     Cardioversion Note  BRAIDEN RODMAN [id number] 13-Dec-1940  Procedure: DC Cardioversion Indications: atrial fib  Procedure Details Consent: Obtained Time Out: Verified patient identification, verified procedure, site/side was marked, verified correct patient position, special equipment/implants available, Radiology Safety Procedures followed,  medications/allergies/relevent history reviewed, required imaging and test results available.  Performed  The patient has been on adequate anticoagulation.  The patient had been sedated for the TEE.  Synchronous cardioversion was performed at 120 joules.  The cardioversion was successful.  Complications: No apparent complications Patient did tolerate procedure well.   Vesta Mixer, Montez Hageman., MD, [facility] 03/30/2013, 10:31 AM

## 2013-03-31 ENCOUNTER — Ambulatory Visit: Payer: Managed Care, Other (non HMO) | Admitting: Cardiology

## 2013-03-31 ENCOUNTER — Encounter (HOSPITAL_COMMUNITY): Payer: Self-pay | Admitting: Cardiovascular Disease

## 2013-03-31 MED ORDER — APIXABAN 5 MG PO TABS: 5.0000 mg | ORAL_TABLET | Freq: Two times a day (BID) | ORAL | Status: DC

## 2013-03-31 MED ORDER — FUROSEMIDE 40 MG PO TABS: 40.0000 mg | ORAL_TABLET | Freq: Every day | ORAL | Status: DC

## 2013-03-31 MED ORDER — AMIODARONE HCL 200 MG PO TABS: 200.0000 mg | ORAL_TABLET | Freq: Two times a day (BID) | ORAL | Status: DC

## 2013-03-31 NOTE — Progress Notes (Signed)
Pt d/c home with wife. D/c instructions and medications reviewed with Pt and wife. Pt and wife state understanding. All Pt and wife questions answered

## 2013-03-31 NOTE — Discharge Summary (Signed)
Physician Discharge Summary  Corey Corey Tyler:[id number] DOB: [birth year] DOA: 03/26/2013  PCP: Corey Grippe, MD  Admit date: 03/26/2013 Discharge date: 03/31/2013  Time spent: 40 minutes  Recommendations for Outpatient Follow-up:  D/c home. Follow up with PCP in 1 week. needs BMET. Follow up with Dr [name] in 2 weeks  Discharge Diagnoses:   Principle problem   Acute on chronic systolic heart failure  Active Problems:   Atrial fibrillation   SOB (shortness of breath)   CAP (community acquired pneumonia)   Discharge Condition: fair  Diet recommendation: cardiac  Filed Weights   03/29/13 0659 03/30/13 0455 03/31/13 0607  Weight: 91.989 kg (202 lb 12.8 oz) 92.2 kg (203 lb 4.2 oz) 91.3 kg (201 lb 4.5 oz)    History of present illness:  72 year old male with history of CAD, severe cardiomyopathy with EF of 15%, chronic A. fib presented with worsening shortness of breath for past few days with increasing weight. Patient found to be in A. fib with RVR as well. Also noted for possible pneumonia on chest x-ray. Patient admitted for acute on chronic systolic CHF with associated pneumonia.    Hospital Course:  Acute on chronic systoic heart failure  diuresing with IV Lasix 40 mg daily. Continue lisinopril and coreg. Has been negative by 7.8L since admission. EF of 25-30% on TEE. Has not required o2 Has minimal crackles on left lung base but much improved. Will discharge on 40 mg po lasix ( was on 20 mg at home)   ? Community acquired pneumonia  Treated with 5 days of levaquin  Persistent A. fib  Started on amiodarone. Continue Coreg for rate control. On eliquis as outpt and dose increased. He had a TEE with DCCV doen on 5/8. Now in sinus. Will discharge on amiodarone 200 mg bid. Continue coreg and eliquis ( dose increased )    Patient clinically stable . He can be discharged home with outpatient f/up with his PCP in 1 week ( needs BMET checked, refused to get it done prior to  discharge today) and his cardiologist in 2 weeks  Code Status: Full code  Family Communication: plan updated to wife  Consultants:  [facility]   Procedures:  TEE with DCCV on 5/8   Antibiotics:  IV Levaquin (5/4>>)         Discharge Exam: Filed Vitals:   03/30/13 1842 03/30/13 2027 03/31/13 0607 03/31/13 0810  BP:  123/73 127/78   Pulse:  63 68   Temp:  97.7 F (36.5 C) 98.1 F (36.7 C)   TempSrc:  Oral Oral   Resp: 18 18 18    Height:      Weight:   91.3 kg (201 lb 4.5 oz)   SpO2: 93% 98% 94% 94%    General: Elderly male in no acute distress  HEENT: No pallor, moist oral mucosa  Chest: Equal air entry bilaterally,fine crackles at left lung base CVS: S1 and S2 regular, no murmurs rub or gallop  Abdomen: Soft, nontender, nondistended, bowel sounds present  Extremities: warm, no edema  CNS: AAO x3    Discharge Instructions   Future Appointments Provider Department Dept Phone   06/26/2013 10:00 AM [facility] Main Office [place]) [phone]       Medication List    TAKE these medications       amiodarone 200 MG tablet  Commonly known as:  PACERONE  Take 1 tablet (200 mg total) by mouth 2 (two) times daily.  apixaban 5 MG Tabs tablet  Commonly known as:  ELIQUIS  Take 1 tablet (5 mg total) by mouth 2 (two) times daily.     carvedilol 6.25 MG tablet  Commonly known as:  COREG  Take 1 tablet (6.25 mg total) by mouth 2 (two) times daily with a meal.     cholecalciferol 1000 UNITS tablet  Commonly known as:  VITAMIN D  Take 1,000 Units by mouth daily.     finasteride 5 MG tablet  Commonly known as:  PROSCAR  Take 5 mg by mouth every morning.     Fish Oil 1200 MG Caps  Take 1 capsule by mouth 2 (two) times daily.     FLOMAX 0.4 MG Caps  Generic drug:  tamsulosin  Take 0.4 mg by mouth daily.     fluticasone 50 MCG/ACT nasal spray  Commonly known as:  FLONASE  Place 1 spray into the nose daily as  needed. For congestion     Fluticasone-Salmeterol 250-50 MCG/DOSE Aepb  Commonly known as:  ADVAIR  Inhale 1 puff into the lungs every 12 (twelve) hours.     furosemide 40 MG tablet  Commonly known as:  LASIX  Take 1 tablet (40 mg total) by mouth daily.     lisinopril 40 MG tablet  Commonly known as:  PRINIVIL,ZESTRIL  Take 40 mg by mouth daily.     multivitamin with minerals Tabs  Take 1 tablet by mouth daily.     nitroGLYCERIN 0.4 MG SL tablet  Commonly known as:  NITROSTAT  Place 0.4 mg under the tongue every 5 (five) minutes as needed. For chest pain     pantoprazole 40 MG tablet  Commonly known as:  PROTONIX  Take 40 mg by mouth daily.     psyllium 58.6 % powder  Commonly known as:  METAMUCIL  Take 1 packet by mouth daily.     rosuvastatin 5 MG tablet  Commonly known as:  CRESTOR  Take 5 mg by mouth daily.       Allergies  Allergen Reactions  . Atorvastatin Other (See Comments)    cough       Follow-up Information   Follow up with Corey Grippe, MD In 1 week. (needs BMET checked)    Contact information:   [address] [place] [address] [phone]       Follow up with [name], MD. Schedule an appointment as soon as possible for a visit in 2 weeks.   Contact information:   [address] [place] [address] [phone]        The results of significant diagnostics from this hospitalization (including imaging, microbiology, ancillary and laboratory) are listed below for reference.    Significant Diagnostic Studies: Dg Chest 2 View  03/26/2013  *RADIOLOGY REPORT*  Clinical Data: Shortness of breath, nausea  CHEST - 2 VIEW  Comparison: 01/06/2013  Findings: Cardiomediastinal silhouette is unremarkable.  There is patchy infiltrate in the left base suspicious for pneumonia.  Dual lead cardiac pacemaker is unchanged in position. No pulmonary edema.  IMPRESSION: Patchy airspace disease in  the left base suspicious for pneumonia. No pulmonary edema.   Original Report Authenticated By: Natasha Mead, M.D.    Dg Chest Port 1 View  03/28/2013  *RADIOLOGY REPORT*  Clinical Data: 72 year old male with shortness of breath.  PORTABLE CHEST - 1 VIEW  Comparison: 03/26/2013 and earlier.  Findings: Portable semi upright AP view at 0218 hours.  Stable lung volumes.  Stable cardiac size and mediastinal contours.  Stable left chest cardiac pacemaker / AICD.  No pneumothorax.  Small left pleural effusion is stable.  Asymmetric mild streaky and reticulonodular density at the left lung base is stable or mildly increased.  IMPRESSION:  Stable to mildly increased patchy and reticulonodular left lung base opacity, remaining suspicious for pneumonia.  Stable small left effusion.   Original Report Authenticated By: Erskine Speed, M.D.     Microbiology: No results found for this or any previous visit (from the past 240 hour(s)).   Labs: Basic Metabolic Panel:  Recent Labs Lab 03/26/13 1139 03/27/13 0248 03/28/13 0509 03/29/13 0532  NA 141 139 142 137  K 3.5 4.4 4.0 4.0  CL 104 101 105 103  CO2 29 29 29 27   GLUCOSE 133* 112* 102* 99  BUN 16 17 19 21   CREATININE 1.28 1.18 1.25 1.33  CALCIUM 8.9 8.8 8.6 9.0  MG 2.2  --   --   --    Liver Function Tests:  Recent Labs Lab 03/27/13 1004  AST 18  ALT 22  ALKPHOS 75  BILITOT 0.8  PROT 6.6  ALBUMIN 3.3*   No results found for this basename: LIPASE, AMYLASE,  in the last 168 hours No results found for this basename: AMMONIA,  in the last 168 hours CBC:  Recent Labs Lab 03/26/13 1139 03/29/13 0532  WBC 7.4 8.2  HGB 14.0 13.5  HCT 40.1 38.7*  MCV 89.5 89.8  PLT 166 165   Cardiac Enzymes:  Recent Labs Lab 03/26/13 1540 03/26/13 2037 03/27/13 0248  TROPONINI <0.30 <0.30 <0.30   BNP: BNP (last 3 results)  Recent Labs  03/26/13 1139 03/27/13 0248  PROBNP 3488.0* 2908.0*   CBG: No results found for this basename: GLUCAP,  in  the last 168 hours     Signed:  Eddie North  [facility] 03/31/2013, 8:43 AM

## 2013-03-31 NOTE — Progress Notes (Signed)
   SUBJECTIVE:  Breathing better and he wants to go home.     PHYSICAL EXAM Filed Vitals:   03/30/13 1500 03/30/13 1842 03/30/13 2027 03/31/13 0607  BP:   123/73 127/78  Pulse:   63 68  Temp:   97.7 F (36.5 C) 98.1 F (36.7 C)  TempSrc:   Oral Oral  Resp: 18 18 18 18   Height:      Weight:    201 lb 4.5 oz (91.3 kg)  SpO2: 96% 93% 98% 94%   General:  No acute distress Lungs:  No crackles Heart:  Irregular Abdomen:  Positive bowel sounds, no rebound no guarding Extremities:  Positive bowel sounds, no rebound no guarding  LABS:  No results found for this or any previous visit (from the past 24 hour(s)).  Intake/Output Summary (Last 24 hours) at 03/31/13 0700 Last data filed at 03/31/13 4132  Gross per 24 hour  Intake 1067.83 ml  Output   2225 ml  Net -1157.17 ml    ASSESSMENT AND PLAN:  ATRIAL FIB:  On amiodarone.  TEE/DCCV yesterday.  When discharged decrease amiodarone to 200 mg bid. Continue Eliquis.   ACUTE ON CHRONIC SYSTOLIC AND DIASTOLIC HF:  6.8 liters negative since admission.   Still with crackles.  Continue IV Lasix in hospital.  (Note labs pending this am.)  Home Lasix is 20 mg daily.  This should be adjusted by daily weights at home.   He needs to follow up in our office in two weeks.   Corey Tyler Corey Tyler 03/31/2013 7:00 AM

## 2013-04-13 ENCOUNTER — Encounter: Payer: Self-pay | Admitting: Cardiology

## 2013-04-13 ENCOUNTER — Ambulatory Visit (INDEPENDENT_AMBULATORY_CARE_PROVIDER_SITE_OTHER): Payer: Managed Care, Other (non HMO) | Admitting: Cardiology

## 2013-04-13 VITALS — BP 111/61 | HR 66 | Ht 73.0 in | Wt 206.4 lb

## 2013-04-13 DIAGNOSIS — Z79899 Other long term (current) drug therapy: Secondary | ICD-10-CM

## 2013-04-13 DIAGNOSIS — I4891 Unspecified atrial fibrillation: Secondary | ICD-10-CM

## 2013-04-13 MED ORDER — AMIODARONE HCL 200 MG PO TABS: 200.0000 mg | ORAL_TABLET | Freq: Every day | ORAL | Status: DC

## 2013-04-13 NOTE — Progress Notes (Signed)
HPI The patient presents for follow up of acute on chronic systolic heart failure.  He was recently hospitalized with this. He was diuresed with IV Lasix. He was eventually started on amiodarone and underwent TEE guided cardioversion. He was also treated with IV antibiotics for possible pneumonia. Today he returns for followup and his EKG demonstrates that he is maintaining normal sinus rhythm. He feels much better since cardioversion. In retrospect he was probably in more atrial fibrillation more frequently than was suspected. It was clear that he was persistently unit for 19 days prior to admission. He now says his breathing better. She's not having as much fatigue. His weights have been stable. He denies any chest pressure, neck or arm discomfort. He denies any shortness of breath, PND or orthopnea.   Allergies  Allergen Reactions  . Atorvastatin Other (See Comments)    cough    Current Outpatient Prescriptions  Medication Sig Dispense Refill  . amiodarone (PACERONE) 200 MG tablet Take 1 tablet (200 mg total) by mouth 2 (two) times daily.  60 tablet  0  . apixaban (ELIQUIS) 5 MG TABS tablet Take 1 tablet (5 mg total) by mouth 2 (two) times daily.  60 tablet  0  . carvedilol (COREG) 6.25 MG tablet Take 1 tablet (6.25 mg total) by mouth 2 (two) times daily with a meal.  60 tablet  11  . cholecalciferol (VITAMIN D) 1000 UNITS tablet Take 1,000 Units by mouth daily.      . finasteride (PROSCAR) 5 MG tablet Take 5 mg by mouth every morning.      . fluticasone (FLONASE) 50 MCG/ACT nasal spray Place 1 spray into the nose daily as needed. For congestion      . Fluticasone-Salmeterol (ADVAIR) 250-50 MCG/DOSE AEPB Inhale 1 puff into the lungs every 12 (twelve) hours.      . furosemide (LASIX) 40 MG tablet Take 1 tablet (40 mg total) by mouth daily.  30 tablet  0  . lisinopril (PRINIVIL,ZESTRIL) 40 MG tablet Take 40 mg by mouth daily.       . Multiple Vitamin (MULITIVITAMIN WITH MINERALS) TABS Take 1  tablet by mouth daily.      . nitroGLYCERIN (NITROSTAT) 0.4 MG SL tablet Place 0.4 mg under the tongue every 5 (five) minutes as needed. For chest pain      . Omega-3 Fatty Acids (FISH OIL) 1200 MG CAPS Take 1 capsule by mouth 2 (two) times daily.        . pantoprazole (PROTONIX) 40 MG tablet Take 40 mg by mouth daily.        . psyllium (METAMUCIL) 58.6 % powder Take 1 packet by mouth daily.       . rosuvastatin (CRESTOR) 5 MG tablet Take 5 mg by mouth daily.        . Tamsulosin HCl (FLOMAX) 0.4 MG CAPS Take 0.4 mg by mouth daily.         No current facility-administered medications for this visit.    Past Medical History  Diagnosis Date  . Old myocardial infarction     a. ?Silent MI. b. Large fixed inferolateral defect c/w with prior infarct, no ischemia. c. Cath in 2000/2013 with only minimal CAD.  Marland Kitchen Hypertension   . COPD (chronic obstructive pulmonary disease)   . Hyperlipidemia   . Chronic systolic heart failure     EF down to 20 to 25% per echo November 2012  . NICM (nonischemic cardiomyopathy)     a. Normal cors  2000. b. Minimal plaque 2013.  . Pulmonary nodule, right     Last scan in 2010 showing stability; felt to be benign.  . Cardiac defibrillator -dual  [facility]   . Atrial tachycardia-non sustained     a. Noted on 03/2012 interrogation.  Marland Kitchen A-fib     a. Noted on 12/2012 interrogation, placed on Apixaban.  Marland Kitchen PVC's (premature ventricular contractions)   . ICD (implantable cardiac defibrillator) in place   . Pacemaker   . Shortness of breath     Past Surgical History  Procedure Laterality Date  . Colon surgery    . Cardiac catheterization  2000  . Icd  12/2011    Graciela Husbands  . Cardiac defibrillator placement    . Appendectomy    . Tee without cardioversion N/A 03/30/2013    Procedure: TRANSESOPHAGEAL ECHOCARDIOGRAM (TEE);  Surgeon: Vesta Mixer, MD;  Location: [facility] ENDOSCOPY;  Service: Cardiovascular;  Laterality: N/A;  . Cardioversion  03/30/2013    Procedure:  CARDIOVERSION;  Surgeon: Vesta Mixer, MD;  Location: [facility] ENDOSCOPY;  Service: Cardiovascular;;    ROS:  As stated in the HPI and negative for all other systems.   PHYSICAL EXAM BP 111/61  Pulse 66  Ht 6\' 1"  (1.854 m)  Wt 206 lb 6.4 oz (93.622 kg)  BMI 27.24 kg/m2 GENERAL:  Well appearing NECK:  No jugular venous distention but hepatojugular reflux noted, waveform within normal limits, carotid upstroke brisk and symmetric, no bruits, no thyromegaly LUNGS:  Clear to auscultation bilaterally BACK:  No CVA tenderness CHEST:  Well healed ICD pocket.   HEART:  PMI not displaced or sustained,S1 and S2 within normal limits, no S3, no S4, no clicks, no rubs, no murmurs, regular ABD:  Flat, positive bowel sounds normal in frequency in pitch, no bruits, no rebound, no guarding, no midline pulsatile mass, no hepatomegaly, no splenomegaly EXT:  2 plus pulses throughout, no edema, no cyanosis no clubbing  ASSESSMENT AND PLAN  Ischemic cardiomyopathy - He seems to be euvolemic.  At this point, no change in therapy is indicated.  We have reviewed salt and fluid restrictions.  No further cardiovascular testing is indicated.  Atrial fibrillation - I will reduce his amiodarone to 200 mg daily. We will closely followup appropriate labs. He will remain on his anticoagulation.  We had a long discussion about the role of a defibrillator, contribution of fibrillation to his symptoms.  HYPERTENSION -  This is being managed in the context of treating his CHF. Well controlled currently  CAD - The patient has no new sypmtoms.  No further cardiovascular testing is indicated.  We will continue with aggressive risk reduction and meds as listed.

## 2013-04-13 NOTE — Patient Instructions (Addendum)
Please decrease your Amiodarone to 200 mg once a day. Continue all other medications as listed.  Have blood work in 2 months (Hepatic and TSH).  You may eat/drink normally this day.  Follow up with Dr Antoine Poche in 2 months.

## 2013-04-20 ENCOUNTER — Encounter: Payer: Self-pay | Admitting: *Deleted

## 2013-05-09 ENCOUNTER — Other Ambulatory Visit: Payer: Self-pay | Admitting: Internal Medicine

## 2013-05-16 ENCOUNTER — Telehealth: Payer: Self-pay | Admitting: Cardiology

## 2013-05-16 MED ORDER — AMIODARONE HCL 200 MG PO TABS: 200.0000 mg | ORAL_TABLET | Freq: Every day | ORAL | Status: DC

## 2013-05-16 NOTE — Telephone Encounter (Signed)
New problem  Pt wants to know if he is till suppose to take  PACERONE 200 MG.  He said he went to the pharmacy and he has no more refills.

## 2013-05-16 NOTE — Telephone Encounter (Signed)
Patient called because he needs a Rx for Amiodarone 200 mg QD.  Patient states his previous Rx was written by hospitalist but that Dr. Antoine Poche wants him to continue this medication.  I verified the patient's instructions from last ov with Dr. Antoine Poche and sent Rx to patient's pharmacy.  The patient requested 90 days supply which I ordered and patient verbalized understanding.

## 2013-06-07 ENCOUNTER — Other Ambulatory Visit: Payer: Self-pay | Admitting: Internal Medicine

## 2013-06-08 ENCOUNTER — Other Ambulatory Visit: Payer: Self-pay | Admitting: Cardiology

## 2013-06-12 ENCOUNTER — Other Ambulatory Visit: Payer: Managed Care, Other (non HMO)

## 2013-06-13 ENCOUNTER — Other Ambulatory Visit (INDEPENDENT_AMBULATORY_CARE_PROVIDER_SITE_OTHER): Payer: Managed Care, Other (non HMO)

## 2013-06-13 DIAGNOSIS — I4891 Unspecified atrial fibrillation: Secondary | ICD-10-CM

## 2013-06-13 DIAGNOSIS — Z79899 Other long term (current) drug therapy: Secondary | ICD-10-CM

## 2013-06-13 LAB — TSH: TSH: 7.22 u[IU]/mL — ABNORMAL HIGH (ref 0.35–5.50)

## 2013-06-13 LAB — HEPATIC FUNCTION PANEL: Albumin: 3.6 g/dL (ref 3.5–5.2)

## 2013-06-15 ENCOUNTER — Encounter: Payer: Self-pay | Admitting: Cardiology

## 2013-06-15 ENCOUNTER — Ambulatory Visit (INDEPENDENT_AMBULATORY_CARE_PROVIDER_SITE_OTHER): Payer: Managed Care, Other (non HMO) | Admitting: Cardiology

## 2013-06-15 VITALS — BP 90/60 | HR 56 | Ht 73.0 in | Wt 207.8 lb

## 2013-06-15 DIAGNOSIS — I4891 Unspecified atrial fibrillation: Secondary | ICD-10-CM

## 2013-06-15 DIAGNOSIS — I5023 Acute on chronic systolic (congestive) heart failure: Secondary | ICD-10-CM

## 2013-06-15 MED ORDER — RIVAROXABAN 20 MG PO TABS: 20.0000 mg | ORAL_TABLET | Freq: Every day | ORAL | Status: DC

## 2013-06-15 NOTE — Patient Instructions (Addendum)
Please stop Eliquis.  Start Xarelto 20 mg a day. Continue all other medications as listed  If rash continues please call back and let us know.  If cough continues please call back and let us know.  Follow up in 6 weeks with Dr Antoine Poche.

## 2013-06-15 NOTE — Progress Notes (Signed)
HPI The patient presents for follow up of acute on chronic systolic heart failure.  He was hospitalized with this earlier this year. He was diuresed with IV Lasix. He was eventually started on amiodarone and underwent TEE guided cardioversion. He was also treated with IV antibiotics for possible pneumonia. He felt much better once he was in sinus rhythm. However, he's now developed a rash and itching eyes and was told by an eye doctor that he might be having an allergic reaction to one of his new medications. He otherwise says he's been doing well with maintenance of sinus rhythm. He's had no presyncope or syncope. He's had no chest pressure, neck or arm discomfort. He's had no weight gain or edema. He has had a dry nonproductive cough.   Allergies  Allergen Reactions  . Atorvastatin Other (See Comments)    cough    Current Outpatient Prescriptions  Medication Sig Dispense Refill  . amiodarone (PACERONE) 200 MG tablet Take 1 tablet (200 mg total) by mouth daily.  90 tablet  3  . carvedilol (COREG) 6.25 MG tablet Take 1 tablet (6.25 mg total) by mouth 2 (two) times daily with a meal.  60 tablet  11  . cholecalciferol (VITAMIN D) 1000 UNITS tablet Take 1,000 Units by mouth daily.      Marland Kitchen ELIQUIS 5 MG TABS tablet TAKE 1 TABLET BY MOUTH TWICE A DAY  60 tablet  3  . finasteride (PROSCAR) 5 MG tablet Take 5 mg by mouth every morning.      . fluticasone (FLONASE) 50 MCG/ACT nasal spray Place 1 spray into the nose daily as needed. For congestion      . Fluticasone-Salmeterol (ADVAIR) 250-50 MCG/DOSE AEPB Inhale 1 puff into the lungs every 12 (twelve) hours.      . furosemide (LASIX) 40 MG tablet TAKE 1 TABLET BY MOUTH EVERY DAY  30 tablet  3  . lisinopril (PRINIVIL,ZESTRIL) 40 MG tablet Take 40 mg by mouth daily.       . Multiple Vitamin (MULITIVITAMIN WITH MINERALS) TABS Take 1 tablet by mouth daily.      . nitroGLYCERIN (NITROSTAT) 0.4 MG SL tablet Place 0.4 mg under the tongue every 5 (five)  minutes as needed. For chest pain      . Omega-3 Fatty Acids (FISH OIL) 1200 MG CAPS Take 1 capsule by mouth 2 (two) times daily.        . pantoprazole (PROTONIX) 40 MG tablet Take 40 mg by mouth daily.        . psyllium (METAMUCIL) 58.6 % powder Take 1 packet by mouth daily.       . rosuvastatin (CRESTOR) 5 MG tablet Take 5 mg by mouth daily.        . Tamsulosin HCl (FLOMAX) 0.4 MG CAPS Take 0.4 mg by mouth daily.         No current facility-administered medications for this visit.    Past Medical History  Diagnosis Date  . Old myocardial infarction     a. ?Silent MI. b. Large fixed inferolateral defect c/w with prior infarct, no ischemia. c. Cath in 2000/2013 with only minimal CAD.  Marland Kitchen Hypertension   . COPD (chronic obstructive pulmonary disease)   . Hyperlipidemia   . Chronic systolic heart failure     EF down to 20 to 25% per echo November 2012  . NICM (nonischemic cardiomyopathy)     a. Normal cors 2000. b. Minimal plaque 2013.  . Pulmonary nodule, right  Last scan in 2010 showing stability; felt to be benign.  . Cardiac defibrillator -dual  [facility]   . Atrial tachycardia-non sustained     a. Noted on 03/2012 interrogation.  Marland Kitchen A-fib     a. Noted on 12/2012 interrogation, placed on Apixaban.  Marland Kitchen PVC's (premature ventricular contractions)   . ICD (implantable cardiac defibrillator) in place   . Pacemaker   . Shortness of breath     Past Surgical History  Procedure Laterality Date  . Colon surgery    . Cardiac catheterization  2000  . Icd  12/2011    Graciela Husbands  . Cardiac defibrillator placement    . Appendectomy    . Tee without cardioversion N/A 03/30/2013    Procedure: TRANSESOPHAGEAL ECHOCARDIOGRAM (TEE);  Surgeon: Vesta Mixer, MD;  Location[facility] ENDOSCOPY;  Service: Cardiovascular;  Laterality: N/A;  . Cardioversion  03/30/2013    Procedure: CARDIOVERSION;  Surgeon: Vesta Mixer, MD;  Location: [facility] ENDOSCOPY;  Service: Cardiovascular;;    ROS:  As stated in the HPI  and negative for all other systems.   PHYSICAL EXAM BP 90/60  Pulse 56  Ht 6\' 1"  (1.854 m)  Wt 207 lb 12.8 oz (94.257 kg)  BMI 27.42 kg/m2 GENERAL:  Well appearing NECK:  No jugular venous distention but hepatojugular reflux noted, waveform within normal limits, carotid upstroke brisk and symmetric, no bruits, no thyromegaly LUNGS:  Clear to auscultation bilaterally BACK:  No CVA tenderness CHEST:  Well healed ICD pocket.   HEART:  PMI not displaced or sustained,S1 and S2 within normal limits, no S3, no S4, no clicks, no rubs, no murmurs, regular ABD:  Flat, positive bowel sounds normal in frequency in pitch, no bruits, no rebound, no guarding, no midline pulsatile mass, no hepatomegaly, no splenomegaly EXT:  2 plus pulses throughout, no edema, no cyanosis no clubbing  ASSESSMENT AND PLAN  Itching eyes - Is unclear what medication could be causing this. I'm going to first give bid of the Eliquis and switch him to Xarelto.  If his symptoms persist the next choice might be to get rid of the amiodarone. As far as the dry cough nose if this persists despite coming off the eloquence is likely density his ACE inhibitor although he has been on this for some time.  Ischemic cardiomyopathy - He seems to be euvolemic.  At this point, no change in therapy is indicated.  We have reviewed salt and fluid restrictions.  No further cardiovascular testing is indicated.  Atrial fibrillation - I will reduce his amiodarone to 200 mg daily. Otherwise as above.  HYPERTENSION -  His blood pressure is actually low today. He is going to keep an eye on this and I might need to back off on the ACE inhibitor if this persists.  CAD - The patient has no new sypmtoms.  No further cardiovascular testing is indicated.  We will continue with aggressive risk reduction and meds as listed.

## 2013-06-19 ENCOUNTER — Other Ambulatory Visit: Payer: Self-pay | Admitting: *Deleted

## 2013-06-19 DIAGNOSIS — R0602 Shortness of breath: Secondary | ICD-10-CM

## 2013-06-19 DIAGNOSIS — R7989 Other specified abnormal findings of blood chemistry: Secondary | ICD-10-CM

## 2013-06-20 ENCOUNTER — Ambulatory Visit (INDEPENDENT_AMBULATORY_CARE_PROVIDER_SITE_OTHER): Payer: Medicare Other | Admitting: *Deleted

## 2013-06-20 DIAGNOSIS — I4949 Other premature depolarization: Secondary | ICD-10-CM

## 2013-06-20 DIAGNOSIS — R6889 Other general symptoms and signs: Secondary | ICD-10-CM

## 2013-06-20 DIAGNOSIS — R0602 Shortness of breath: Secondary | ICD-10-CM

## 2013-06-20 DIAGNOSIS — R7989 Other specified abnormal findings of blood chemistry: Secondary | ICD-10-CM

## 2013-06-20 LAB — T4, FREE: Free T4: 0.93 ng/dL (ref 0.60–1.60)

## 2013-06-20 LAB — T3, FREE: T3, Free: 2.4 pg/mL (ref 2.3–4.2)

## 2013-06-26 ENCOUNTER — Ambulatory Visit (INDEPENDENT_AMBULATORY_CARE_PROVIDER_SITE_OTHER): Payer: Managed Care, Other (non HMO) | Admitting: *Deleted

## 2013-06-26 ENCOUNTER — Encounter: Payer: Self-pay | Admitting: Internal Medicine

## 2013-06-26 DIAGNOSIS — I428 Other cardiomyopathies: Secondary | ICD-10-CM

## 2013-06-26 DIAGNOSIS — I5023 Acute on chronic systolic (congestive) heart failure: Secondary | ICD-10-CM

## 2013-06-26 DIAGNOSIS — Z95810 Presence of automatic (implantable) cardiac defibrillator: Secondary | ICD-10-CM

## 2013-06-27 LAB — REMOTE ICD DEVICE
AL AMPLITUDE: 2 mv
DEVICE MODEL ICD: 7003419
HV IMPEDENCE: 79 Ohm
RV LEAD IMPEDENCE ICD: 350 Ohm
TZAT-0013SLOWVT: 3
TZAT-0018SLOWVT: NEGATIVE
TZAT-0020SLOWVT: 1.5 ms
TZON-0005SLOWVT: 6
TZST-0001SLOWVT: 2
TZST-0001SLOWVT: 4
TZST-0003SLOWVT: 890 V
VENTRICULAR PACING ICD: 1 pct

## 2013-07-13 ENCOUNTER — Telehealth: Payer: Self-pay | Admitting: Cardiology

## 2013-07-13 NOTE — Telephone Encounter (Signed)
New problem   Pt need to talk to nurse concerning his medications he has some questions.

## 2013-07-13 NOTE — Telephone Encounter (Signed)
Pt aware of Dr Hochrein's recommendation - he states he does not want to take the medication and doesn't want to try Coumadin because he will have to come into the office at least once a month for blood work.  States he is not worried about having a stroke and that he just saw a commercial about starting a law suit against this medication.  States he will stay on it for now but will discuss it with his PCP to decide what to do.  Advised to call back with any questions or concerns.

## 2013-07-13 NOTE — Telephone Encounter (Signed)
Per pt call - states the Xarelto is "hurting his stomach"  When asked where it hurts he reports in his lower abdomen and that is feels like acid burning.  He wants to know why he can't just stay on ASA and states that he just doesn't want to take the Xarelto.  Advised pt that Xarelto greatly reduces his risk for stroke due to his At Fib compared to ASA but that I will let Dr Antoine Poche know and call him back with recommendations

## 2013-07-13 NOTE — Telephone Encounter (Signed)
All medications are coarse she. His risk. Stroke is 3.2% per year without systemic anticoagulation. Aspirin and Plavix do not offer benefit. Warfarin or NOAC will reduce the risk of stroke by two thirds. Current guidelines would suggest treatment with one of these agents. We could try another agent. However, the decision to discontinue or continue this medication is the patient's ultimate decision.

## 2013-07-18 ENCOUNTER — Telehealth: Payer: Self-pay | Admitting: *Deleted

## 2013-07-18 NOTE — Telephone Encounter (Signed)
Received fax from [facility] on Advair Diskus.  Step therapy is requiared  Asmanex, Flovent, or Pulmicort are in the pt's step therapy plan.   Dr. Delford Field, pls advise if Advair can be changed to one of these meds.  Thank you.

## 2013-07-18 NOTE — Telephone Encounter (Signed)
No it may not, his lung dz is TOO severe

## 2013-07-18 NOTE — Telephone Encounter (Signed)
He has Gold C COPD  NOT ASTHMA

## 2013-07-19 ENCOUNTER — Encounter: Payer: Self-pay | Admitting: *Deleted

## 2013-07-19 NOTE — Telephone Encounter (Signed)
Called [facility], spoke with Dutton. Advair was APPROVED for 36 months from today's date. Ref # [id number]. Heidi with [facility] aware of approval. Pt aware as well.

## 2013-08-01 ENCOUNTER — Ambulatory Visit: Payer: Managed Care, Other (non HMO) | Admitting: Critical Care Medicine

## 2013-08-08 ENCOUNTER — Encounter: Payer: Self-pay | Admitting: Critical Care Medicine

## 2013-08-08 ENCOUNTER — Ambulatory Visit (INDEPENDENT_AMBULATORY_CARE_PROVIDER_SITE_OTHER): Payer: Medicare Other | Admitting: Critical Care Medicine

## 2013-08-08 VITALS — BP 132/90 | HR 65 | Temp 98.0°F | Ht 73.0 in | Wt 212.4 lb

## 2013-08-08 DIAGNOSIS — R05 Cough: Secondary | ICD-10-CM

## 2013-08-08 DIAGNOSIS — J439 Emphysema, unspecified: Secondary | ICD-10-CM

## 2013-08-08 DIAGNOSIS — J449 Chronic obstructive pulmonary disease, unspecified: Secondary | ICD-10-CM

## 2013-08-08 DIAGNOSIS — J441 Chronic obstructive pulmonary disease with (acute) exacerbation: Secondary | ICD-10-CM

## 2013-08-08 DIAGNOSIS — Z23 Encounter for immunization: Secondary | ICD-10-CM

## 2013-08-08 DIAGNOSIS — J438 Other emphysema: Secondary | ICD-10-CM

## 2013-08-08 MED ORDER — FLUTICASONE-SALMETEROL 250-50 MCG/DOSE IN AEPB: 1.0000 | INHALATION_SPRAY | Freq: Two times a day (BID) | RESPIRATORY_TRACT | Status: DC

## 2013-08-08 NOTE — Progress Notes (Signed)
Subjective:    Patient ID: Corey Tyler, male    DOB: 08-07-1941, 72 y.o.   MRN: [id number]  HPI  72 y.o.    WM Golds Stage C Copd     07/29/2012 Not seen in 1 yr. No real changes.  No qhs dyspnea. Min dry cough. Dyspnea sl worse. Pt denies any significant sore throat, nasal congestion or excess secretions, fever, chills, sweats, unintended weight loss, pleurtic or exertional chest pain, orthopnea PND, or leg swelling Pt denies any increase in rescue therapy over baseline, denies waking up needing it or having any early am or nocturnal exacerbations of coughing/wheezing/or dyspnea. Pt also denies any obvious fluctuation in symptoms with  weather or environmental change or other alleviating or aggravating factors  08/08/2013 Chief Complaint  Patient presents with  . Yearly Follow up    Breathing doing well overall.  Does have a dry cough.  No SOB, wheezing, chest tightness, or chest pain at this time.  Since last ov , pt had atrial fibrillation,  Saw Cards twice with this issue. Hospitalized with CHF and PAF.  Pt DCCV and placed on amiodarone. Since then doing much better. Dyspnea is better. Occ dry cough  Past Medical History  Diagnosis Date  . Old myocardial infarction     a. ?Silent MI. b. Large fixed inferolateral defect c/w with prior infarct, no ischemia. c. Cath in 2000/2013 with only minimal CAD.  Marland Kitchen Hypertension   . COPD (chronic obstructive pulmonary disease)   . Hyperlipidemia   . Chronic systolic heart failure     EF down to 20 to 25% per echo November 2012  . NICM (nonischemic cardiomyopathy)     a. Normal cors 2000. b. Minimal plaque 2013.  . Pulmonary nodule, right     Last scan in 2010 showing stability; felt to be benign.  . Cardiac defibrillator -dual  [facility]   . Atrial tachycardia-non sustained     a. Noted on 03/2012 interrogation.  Marland Kitchen A-fib     a. Noted on 12/2012 interrogation, placed on Apixaban.  Marland Kitchen PVC's (premature ventricular contractions)   . ICD  (implantable cardiac defibrillator) in place   . Pacemaker   . Shortness of breath      Family History  Problem Relation Age of Onset  . Emphysema Mother   . Heart disease Mother     AVR  . Heart failure Father     Died agwe 96     History   Social History  . Marital Status: Married    Spouse Name: N/A    Number of Children: 2  . Years of Education: N/A   Occupational History  . Retired     Airline pilot  . Works [employer]   Social History Main Topics  . Smoking status: Former Smoker -- 2.50 packs/day for 40 years    Types: Cigarettes    Quit date: 11/24/1983  . Smokeless tobacco: Never Used     Comment: 2 1/2 ppd x 40 years  . Alcohol Use: No  . Drug Use: No  . Sexual Activity: Not on file   Other Topics Concern  . Not on file   Social History Narrative  . No narrative on file     Allergies  Allergen Reactions  . Atorvastatin Other (See Comments)    cough     Outpatient Prescriptions Prior to Visit  Medication Sig Dispense Refill  . amiodarone (PACERONE) 200 MG tablet Take 1 tablet (200 mg total) by  mouth daily.  90 tablet  3  . carvedilol (COREG) 6.25 MG tablet Take 1 tablet (6.25 mg total) by mouth 2 (two) times daily with a meal.  60 tablet  11  . cholecalciferol (VITAMIN D) 1000 UNITS tablet Take 1,000 Units by mouth daily.      . finasteride (PROSCAR) 5 MG tablet Take 5 mg by mouth every morning.      . fluticasone (FLONASE) 50 MCG/ACT nasal spray Place 1 spray into the nose daily as needed. For congestion      . furosemide (LASIX) 40 MG tablet TAKE 1 TABLET BY MOUTH EVERY DAY  30 tablet  3  . lisinopril (PRINIVIL,ZESTRIL) 40 MG tablet Take 40 mg by mouth daily.       . Multiple Vitamin (MULITIVITAMIN WITH MINERALS) TABS Take 1 tablet by mouth daily.      . nitroGLYCERIN (NITROSTAT) 0.4 MG SL tablet Place 0.4 mg under the tongue every 5 (five) minutes as needed. For chest pain      . Omega-3 Fatty Acids (FISH OIL) 1200 MG CAPS Take 1 capsule by mouth 2  (two) times daily.        . pantoprazole (PROTONIX) 40 MG tablet Take 40 mg by mouth daily.        . psyllium (METAMUCIL) 58.6 % powder Take 1 packet by mouth daily.       . Rivaroxaban (XARELTO) 20 MG TABS Take 1 tablet (20 mg total) by mouth daily.  30 tablet  11  . rosuvastatin (CRESTOR) 5 MG tablet Take 5 mg by mouth daily.        . Tamsulosin HCl (FLOMAX) 0.4 MG CAPS Take 0.4 mg by mouth daily.        . Fluticasone-Salmeterol (ADVAIR) 250-50 MCG/DOSE AEPB Inhale 1 puff into the lungs every 12 (twelve) hours.       No facility-administered medications prior to visit.      Review of Systems  Constitutional:   No  weight loss, night sweats,  Fevers, chills, fatigue, lassitude. HEENT:   No headaches,  Difficulty swallowing,  Tooth/dental problems,  Sore throat,                No sneezing, itching, ear ache, nasal congestion, post nasal drip,   CV:  No chest pain,  Orthopnea, PND, swelling in lower extremities, anasarca, dizziness, palpitations  GI  No heartburn, indigestion, abdominal pain, nausea, vomiting, diarrhea, change in bowel habits, loss of appetite  Resp: Notes  shortness of breath with exertion not  at rest.  No excess mucus, no productive cough,  Notes  non-productive cough,  No coughing up of blood.  No change in color of mucus.  No wheezing.  No chest wall deformity  Skin: no rash or lesions.  GU: no dysuria, change in color of urine, no urgency or frequency.  No flank pain.  MS:  No joint pain or swelling.  No decreased range of motion.  No back pain.  Psych:  No change in mood or affect. No depression or anxiety.  No memory loss.     Objective:   Physical Exam  Filed Vitals:   08/08/13 1426  BP: 132/90  Pulse: 65  Temp: 98 F (36.7 C)  TempSrc: Oral  Height: 6\' 1"  (1.854 m)  Weight: 96.344 kg (212 lb 6.4 oz)  SpO2: 90%    Gen: Pleasant, well-nourished, in no distress,  normal affect  ENT: No lesions,  mouth clear,  oropharynx clear, no postnasal  drip  Neck: No JVD, no TMG, no carotid bruits  Lungs: No use of accessory muscles, no dullness to percussion, distant BS  Cardiovascular: RRR, heart sounds normal, no murmur or gallops, no peripheral edema  Abdomen: soft and NT, no HSM,  BS normal  Musculoskeletal: No deformities, no cyanosis or clubbing  Neuro: alert, non focal  Skin: Warm, no lesions or rashes       Assessment & Plan:   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 Gold C Copd stable at present  Plan Return in    4 months Refill on advair sent A flu vaccine was given You need oxygen 2Liter at night and with exertion for now    Updated Medication List Outpatient Encounter Prescriptions as of 08/08/2013  Medication Sig Dispense Refill  . amiodarone (PACERONE) 200 MG tablet Take 1 tablet (200 mg total) by mouth daily.  90 tablet  3  . carvedilol (COREG) 6.25 MG tablet Take 1 tablet (6.25 mg total) by mouth 2 (two) times daily with a meal.  60 tablet  11  . Chlorpheniramine-Acetaminophen (CORICIDIN HBP COLD/FLU PO) Take by mouth as needed.      . cholecalciferol (VITAMIN D) 1000 UNITS tablet Take 1,000 Units by mouth daily.      . finasteride (PROSCAR) 5 MG tablet Take 5 mg by mouth every morning.      . fluticasone (FLONASE) 50 MCG/ACT nasal spray Place 1 spray into the nose daily as needed. For congestion      . Fluticasone-Salmeterol (ADVAIR) 250-50 MCG/DOSE AEPB Inhale 1 puff into the lungs every 12 (twelve) hours.  60 each  11  . furosemide (LASIX) 40 MG tablet TAKE 1 TABLET BY MOUTH EVERY DAY  30 tablet  3  . lisinopril (PRINIVIL,ZESTRIL) 40 MG tablet Take 40 mg by mouth daily.       . Multiple Vitamin (MULITIVITAMIN WITH MINERALS) TABS Take 1 tablet by mouth daily.      . nitroGLYCERIN (NITROSTAT) 0.4 MG SL tablet Place 0.4 mg under the tongue every 5 (five) minutes as needed. For chest pain      . Omega-3 Fatty Acids (FISH OIL) 1200 MG CAPS Take 1 capsule by mouth 2 (two) times  daily.        . pantoprazole (PROTONIX) 40 MG tablet Take 40 mg by mouth daily.        . psyllium (METAMUCIL) 58.6 % powder Take 1 packet by mouth daily.       . Rivaroxaban (XARELTO) 20 MG TABS Take 1 tablet (20 mg total) by mouth daily.  30 tablet  11  . rosuvastatin (CRESTOR) 5 MG tablet Take 5 mg by mouth daily.        . Tamsulosin HCl (FLOMAX) 0.4 MG CAPS Take 0.4 mg by mouth daily.        . [DISCONTINUED] Fluticasone-Salmeterol (ADVAIR) 250-50 MCG/DOSE AEPB Inhale 1 puff into the lungs every 12 (twelve) hours.       No facility-administered encounter medications on file as of 08/08/2013.

## 2013-08-08 NOTE — Patient Instructions (Addendum)
No change in medications. Return in    4 months Refill on advair sent A flu vaccine was given You need oxygen 2Liter at night and with exertion for now, I called this in to [facility]

## 2013-08-09 NOTE — Assessment & Plan Note (Signed)
Gold C Copd stable at present  Plan Return in    4 months Refill on advair sent A flu vaccine was given You need oxygen 2Liter at night and with exertion for now

## 2013-08-22 ENCOUNTER — Ambulatory Visit (INDEPENDENT_AMBULATORY_CARE_PROVIDER_SITE_OTHER): Payer: Medicare Other | Admitting: Cardiology

## 2013-08-22 ENCOUNTER — Encounter: Payer: Self-pay | Admitting: Cardiology

## 2013-08-22 VITALS — BP 128/73 | HR 62 | Ht 73.0 in | Wt 208.3 lb

## 2013-08-22 DIAGNOSIS — I4891 Unspecified atrial fibrillation: Secondary | ICD-10-CM

## 2013-08-22 DIAGNOSIS — Z95810 Presence of automatic (implantable) cardiac defibrillator: Secondary | ICD-10-CM

## 2013-08-22 DIAGNOSIS — I428 Other cardiomyopathies: Secondary | ICD-10-CM

## 2013-08-22 NOTE — Progress Notes (Signed)
HPI The patient presents for follow up of acute on chronic systolic heart failure and atrial fibrillation.  Since I last saw him he was found to have a TSH of 17.57 probably related to amiodarone and has been placed on thyroid replacement therapy. Labs have otherwise been okay. He's not felt any palpitations. He denies any new shortness of breath, PND or orthopnea. He has had no chest pressure, neck or arm discomfort. He has had no weight gain or edema.   Allergies  Allergen Reactions  . Atorvastatin Other (See Comments)    cough    Current Outpatient Prescriptions  Medication Sig Dispense Refill  . amiodarone (PACERONE) 200 MG tablet Take 1 tablet (200 mg total) by mouth daily.  90 tablet  3  . carvedilol (COREG) 6.25 MG tablet Take 1 tablet (6.25 mg total) by mouth 2 (two) times daily with a meal.  60 tablet  11  . Chlorpheniramine-Acetaminophen (CORICIDIN HBP COLD/FLU PO) Take by mouth as needed.      . cholecalciferol (VITAMIN D) 1000 UNITS tablet Take 1,000 Units by mouth daily.      . finasteride (PROSCAR) 5 MG tablet Take 5 mg by mouth every morning.      . fluticasone (FLONASE) 50 MCG/ACT nasal spray Place 1 spray into the nose daily as needed. For congestion      . Fluticasone-Salmeterol (ADVAIR) 250-50 MCG/DOSE AEPB Inhale 1 puff into the lungs every 12 (twelve) hours.  60 each  11  . furosemide (LASIX) 40 MG tablet TAKE 1 TABLET BY MOUTH EVERY DAY  30 tablet  3  . lisinopril (PRINIVIL,ZESTRIL) 40 MG tablet Take 40 mg by mouth daily.       . Multiple Vitamin (MULITIVITAMIN WITH MINERALS) TABS Take 1 tablet by mouth daily.      . nitroGLYCERIN (NITROSTAT) 0.4 MG SL tablet Place 0.4 mg under the tongue every 5 (five) minutes as needed. For chest pain      . NON FORMULARY Thyroid medication      . Omega-3 Fatty Acids (FISH OIL) 1200 MG CAPS Take 1 capsule by mouth 2 (two) times daily.        . pantoprazole (PROTONIX) 40 MG tablet Take 40 mg by mouth daily.        . psyllium  (METAMUCIL) 58.6 % powder Take 1 packet by mouth daily.       . Rivaroxaban (XARELTO) 20 MG TABS Take 1 tablet (20 mg total) by mouth daily.  30 tablet  11  . rosuvastatin (CRESTOR) 5 MG tablet Take 5 mg by mouth daily.        . Tamsulosin HCl (FLOMAX) 0.4 MG CAPS Take 0.4 mg by mouth daily.         No current facility-administered medications for this visit.    Past Medical History  Diagnosis Date  . Old myocardial infarction     a. ?Silent MI. b. Large fixed inferolateral defect c/w with prior infarct, no ischemia. c. Cath in 2000/2013 with only minimal CAD.  Marland Kitchen Hypertension   . COPD (chronic obstructive pulmonary disease)   . Hyperlipidemia   . Chronic systolic heart failure     EF down to 20 to 25% per echo November 2012  . NICM (nonischemic cardiomyopathy)     a. Normal cors 2000. b. Minimal plaque 2013.  . Pulmonary nodule, right     Last scan in 2010 showing stability; felt to be benign.  . Cardiac defibrillator -dual  [facility]   .  Atrial tachycardia-non sustained     a. Noted on 03/2012 interrogation.  Marland Kitchen A-fib     a. Noted on 12/2012 interrogation, placed on Apixaban.  Marland Kitchen PVC's (premature ventricular contractions)   . ICD (implantable cardiac defibrillator) in place   . Pacemaker   . Shortness of breath     Past Surgical History  Procedure Laterality Date  . Colon surgery    . Cardiac catheterization  2000  . Icd  12/2011    Graciela Husbands  . Cardiac defibrillator placement    . Appendectomy    . Tee without cardioversion N/A 03/30/2013    Procedure: TRANSESOPHAGEAL ECHOCARDIOGRAM (TEE);  Surgeon: Vesta Mixer, MD;  Location: [facility] ENDOSCOPY;  Service: Cardiovascular;  Laterality: N/A;  . Cardioversion  03/30/2013    Procedure: CARDIOVERSION;  Surgeon: Vesta Mixer, MD;  Location: [facility] ENDOSCOPY;  Service: Cardiovascular;;    ROS:  As stated in the HPI and negative for all other systems.   PHYSICAL EXAM BP 128/73  Pulse 62  Ht 6\' 1"  (1.854 m)  Wt 208 lb 4.8 oz (94.484 kg)   BMI 27.49 kg/m2 GENERAL:  Well appearing NECK:  No jugular venous distention but hepatojugular reflux noted, waveform within normal limits, carotid upstroke brisk and symmetric, no bruits, no thyromegaly LUNGS:  Clear to auscultation bilaterally BACK:  No CVA tenderness CHEST:  Well healed ICD pocket.   HEART:  PMI not displaced or sustained,S1 and S2 within normal limits, no S3, no S4, no clicks, no rubs, no murmurs, regular ABD:  Flat, positive bowel sounds normal in frequency in pitch, no bruits, no rebound, no guarding, no midline pulsatile mass, no hepatomegaly, no splenomegaly EXT:  2 plus pulses throughout, no edema, no cyanosis no clubbing  EKG:  Atrial paced rate 60, axis within normal limits, intervals within normal limits, no acute ST-T wave changes, nonspecific T-wave flattening.  08/22/2013   ASSESSMENT AND PLAN  Itching eyes - This turned out to be related oranges. He is not eating this anymore.   Ischemic cardiomyopathy - He seems to be euvolemic.  At this point, no change in therapy is indicated.  We have reviewed salt and fluid restrictions.  No further cardiovascular testing is indicated.  Atrial fibrillation - He will continue on amiodarone at the current dose. He understands that if we cannot regulate his thyroid we will have to switch to another antiarrhythmic.  HYPERTENSION -  His blood pressure is actually low today. He is going to keep an eye on this and I might need to back off on the ACE inhibitor if this persists.  CAD - The patient has no new sypmtoms.  No further cardiovascular testing is indicated.  We will continue with aggressive risk reduction and meds as listed.

## 2013-08-22 NOTE — Patient Instructions (Addendum)
The current medical regimen is effective;  continue present plan and medications.  Follow up in 6 months with Dr Antoine Poche.

## 2013-09-11 ENCOUNTER — Ambulatory Visit (INDEPENDENT_AMBULATORY_CARE_PROVIDER_SITE_OTHER): Payer: Self-pay | Admitting: Surgery

## 2013-09-21 ENCOUNTER — Telehealth: Payer: Self-pay | Admitting: Gastroenterology

## 2013-09-21 NOTE — Telephone Encounter (Signed)
Pt states he had some rectal bleeding with the last 2 BM's but now it seems to have stopped. Pt states that he wants to schedule and EGD and colon. Discussed with pt that since he is having symptoms he needs to be seen for an OV. Offered pt an appt with [facility] for today or tomorrow but pt only wants to see a doctor. Pt scheduled to see Dr. Arlyce Dice 10/02/13@10am , he is aware of appt. Pt knows to call back if bleeding returns.

## 2013-09-22 ENCOUNTER — Observation Stay (HOSPITAL_COMMUNITY)
Admission: EM | Admit: 2013-09-22 | Discharge: 2013-09-25 | Disposition: A | Discharge status: Home / Self Care | Payer: Medicare Other | Attending: Internal Medicine | Admitting: Internal Medicine

## 2013-09-22 ENCOUNTER — Encounter (HOSPITAL_COMMUNITY): Payer: Self-pay | Admitting: Emergency Medicine

## 2013-09-22 DIAGNOSIS — Z95810 Presence of automatic (implantable) cardiac defibrillator: Secondary | ICD-10-CM

## 2013-09-22 DIAGNOSIS — J438 Other emphysema: Secondary | ICD-10-CM | POA: Insufficient documentation

## 2013-09-22 DIAGNOSIS — J984 Other disorders of lung: Secondary | ICD-10-CM | POA: Diagnosis present

## 2013-09-22 DIAGNOSIS — I4891 Unspecified atrial fibrillation: Secondary | ICD-10-CM

## 2013-09-22 DIAGNOSIS — I10 Essential (primary) hypertension: Secondary | ICD-10-CM

## 2013-09-22 DIAGNOSIS — I4949 Other premature depolarization: Secondary | ICD-10-CM

## 2013-09-22 DIAGNOSIS — E785 Hyperlipidemia, unspecified: Secondary | ICD-10-CM | POA: Diagnosis present

## 2013-09-22 DIAGNOSIS — R197 Diarrhea, unspecified: Secondary | ICD-10-CM

## 2013-09-22 DIAGNOSIS — I5022 Chronic systolic (congestive) heart failure: Secondary | ICD-10-CM | POA: Diagnosis present

## 2013-09-22 DIAGNOSIS — K922 Gastrointestinal hemorrhage, unspecified: Secondary | ICD-10-CM | POA: Diagnosis present

## 2013-09-22 DIAGNOSIS — I252 Old myocardial infarction: Secondary | ICD-10-CM | POA: Insufficient documentation

## 2013-09-22 DIAGNOSIS — J439 Emphysema, unspecified: Secondary | ICD-10-CM | POA: Diagnosis present

## 2013-09-22 DIAGNOSIS — R911 Solitary pulmonary nodule: Secondary | ICD-10-CM | POA: Insufficient documentation

## 2013-09-22 DIAGNOSIS — R05 Cough: Secondary | ICD-10-CM

## 2013-09-22 DIAGNOSIS — Z980 Intestinal bypass and anastomosis status: Secondary | ICD-10-CM | POA: Insufficient documentation

## 2013-09-22 DIAGNOSIS — R1032 Left lower quadrant pain: Secondary | ICD-10-CM

## 2013-09-22 DIAGNOSIS — R059 Cough, unspecified: Secondary | ICD-10-CM

## 2013-09-22 DIAGNOSIS — I428 Other cardiomyopathies: Secondary | ICD-10-CM | POA: Diagnosis present

## 2013-09-22 DIAGNOSIS — Z79899 Other long term (current) drug therapy: Secondary | ICD-10-CM | POA: Insufficient documentation

## 2013-09-22 DIAGNOSIS — K5730 Diverticulosis of large intestine without perforation or abscess without bleeding: Secondary | ICD-10-CM

## 2013-09-22 HISTORY — DX: Unspecified osteoarthritis, unspecified site: M19.90

## 2013-09-22 LAB — COMPREHENSIVE METABOLIC PANEL
AST: 22 U/L (ref 0–37)
Albumin: 3.8 g/dL (ref 3.5–5.2)
Alkaline Phosphatase: 65 U/L (ref 39–117)
BUN: 16 mg/dL (ref 6–23)
Chloride: 104 mEq/L (ref 96–112)
Creatinine, Ser: 1.31 mg/dL (ref 0.50–1.35)
GFR calc Af Amer: 61 mL/min — ABNORMAL LOW (ref >90)
GFR calc non Af Amer: 52 mL/min — ABNORMAL LOW (ref >90)
Glucose, Bld: 105 mg/dL — ABNORMAL HIGH (ref 70–99)
Potassium: 3.9 mEq/L (ref 3.5–5.1)
Total Bilirubin: 0.6 mg/dL (ref 0.3–1.2)

## 2013-09-22 LAB — CBC
HCT: 38.3 % — ABNORMAL LOW (ref 39.0–52.0)
Hemoglobin: 13.2 g/dL (ref 13.0–17.0)
MCHC: 34.5 g/dL (ref 30.0–36.0)
MCV: 93.2 fL (ref 78.0–100.0)
Platelets: 178 10*3/uL (ref 150–400)
RDW: 14 % (ref 11.5–15.5)
WBC: 7.4 10*3/uL (ref 4.0–10.5)

## 2013-09-22 LAB — PROTIME-INR: INR: 1.55 — ABNORMAL HIGH (ref 0.00–1.49)

## 2013-09-22 LAB — OCCULT BLOOD, POC DEVICE: Fecal Occult Bld: POSITIVE — AB

## 2013-09-22 MED ORDER — SODIUM CHLORIDE 0.9 % IV SOLN
INTRAVENOUS | Status: AC
  Administered 2013-09-22: 17:00:00 via INTRAVENOUS

## 2013-09-22 MED ORDER — MORPHINE SULFATE 2 MG/ML IJ SOLN: 0.5000 mg | INTRAMUSCULAR | Status: DC | PRN

## 2013-09-22 MED ORDER — SODIUM CHLORIDE 0.9 % IV BOLUS (SEPSIS)
1000.0000 mL | Freq: Once | INTRAVENOUS | Status: AC
  Administered 2013-09-22: 1000 mL via INTRAVENOUS

## 2013-09-22 MED ORDER — MOMETASONE FURO-FORMOTEROL FUM 100-5 MCG/ACT IN AERO
2.0000 | INHALATION_SPRAY | Freq: Two times a day (BID) | RESPIRATORY_TRACT | Status: DC
  Administered 2013-09-22 – 2013-09-25 (×5): 2 via RESPIRATORY_TRACT
  Filled 2013-09-22: qty 8.8

## 2013-09-22 MED ORDER — DICYCLOMINE HCL 10 MG PO CAPS
10.0000 mg | ORAL_CAPSULE | Freq: Three times a day (TID) | ORAL | Status: DC
  Administered 2013-09-22 – 2013-09-25 (×10): 10 mg via ORAL
  Filled 2013-09-22 (×11): qty 1

## 2013-09-22 MED ORDER — CARVEDILOL 3.125 MG PO TABS
3.1250 mg | ORAL_TABLET | Freq: Two times a day (BID) | ORAL | Status: DC
  Administered 2013-09-22 – 2013-09-25 (×7): 3.125 mg via ORAL
  Filled 2013-09-22 (×8): qty 1

## 2013-09-22 MED ORDER — LEVOTHYROXINE SODIUM 25 MCG PO TABS
25.0000 ug | ORAL_TABLET | Freq: Every day | ORAL | Status: DC
  Administered 2013-09-23 – 2013-09-25 (×3): 25 ug via ORAL
  Filled 2013-09-22 (×4): qty 1

## 2013-09-22 MED ORDER — DIPHENHYDRAMINE HCL 25 MG PO CAPS
25.0000 mg | ORAL_CAPSULE | Freq: Four times a day (QID) | ORAL | Status: DC | PRN
  Administered 2013-09-22 – 2013-09-24 (×2): 25 mg via ORAL
  Filled 2013-09-22: qty 1

## 2013-09-22 MED ORDER — SODIUM CHLORIDE 0.9 % IJ SOLN
3.0000 mL | Freq: Two times a day (BID) | INTRAMUSCULAR | Status: DC
  Administered 2013-09-22 – 2013-09-23 (×4): 3 mL via INTRAVENOUS
  Administered 2013-09-24: 23:00:00 via INTRAVENOUS
  Administered 2013-09-24 – 2013-09-25 (×2): 3 mL via INTRAVENOUS

## 2013-09-22 MED ORDER — TAMSULOSIN HCL 0.4 MG PO CAPS
0.4000 mg | ORAL_CAPSULE | Freq: Every day | ORAL | Status: DC
  Administered 2013-09-22 – 2013-09-25 (×4): 0.4 mg via ORAL
  Filled 2013-09-22 (×4): qty 1

## 2013-09-22 MED ORDER — AMIODARONE HCL 200 MG PO TABS
200.0000 mg | ORAL_TABLET | Freq: Every day | ORAL | Status: DC
Start: 2013-09-23 — End: 2013-09-25
  Administered 2013-09-23 – 2013-09-25 (×3): 200 mg via ORAL
  Filled 2013-09-22 (×3): qty 1

## 2013-09-22 MED ORDER — FINASTERIDE 5 MG PO TABS
5.0000 mg | ORAL_TABLET | Freq: Every morning | ORAL | Status: DC
  Administered 2013-09-23 – 2013-09-25 (×3): 5 mg via ORAL
  Filled 2013-09-22 (×3): qty 1

## 2013-09-22 NOTE — ED Notes (Signed)
Per admitting MD pt will need telemetry bed. Dr Mahala Menghini sts he will change admitting orders.

## 2013-09-22 NOTE — ED Notes (Signed)
Dr Mahala Menghini still at bedside.

## 2013-09-22 NOTE — H&P (Signed)
[facility] History and Physical  Corey Tyler ZOX:[id number] DOB: [birth year] DOA: 09/22/2013  Referring physician: Felix Pacini, ED-P PCP: [name], MD  Specialists: None currently  Chief Complaint: GI bleed  HPI: Corey Tyler is a 72 y.o. male, extensive PMH with prior h/o diverticulitis who presented to [facility] today 10/31 with ~10 multiple small episodes of GIB lower, mixed with stool.  His wife reports that he actually had 6 episodes of dark stool before going to work at [employer] yesterday and then felt weak and had 2 more episodes of dark stool subsequently and then to max dose last night.  He's not had any further dark stool since then.  No recent Antibiotic exposure. Patient actually called his primary gastroenterologist and asked to see an M.D. and declined going to the office yesterday after the amount of stools and he had Wife in room states started in the am, after he work at [employer] and felt "weak"-he specifcally deneis on closed questioning dizzyness, n, v or CP associated with this.  No SOB or diaphoresis, no fever no chills no similar illnesses in family, no exotic food or travel  Review of Systems: What Denies shortness of breath denies chest pain denies blurred vision denies double vision Tolerating some diet hasn't really eaten since this all occurred. Requesting right now some broth Wants to know whether he can have colonoscopy and endoscopy  Past Medical History  Diagnosis Date  . Old myocardial infarction     a. ?Silent MI. b. Large fixed inferolateral defect c/w with prior infarct, no ischemia. c. Cath in 2000/2013 with only minimal CAD.  Marland Kitchen Hypertension   . COPD (chronic obstructive pulmonary disease)   . Hyperlipidemia   . Chronic systolic heart failure     EF down to 20 to 25% per echo November 2012  . NICM (nonischemic cardiomyopathy)     a. Normal cors 2000. b. Minimal plaque 2013.  . Pulmonary nodule, right     Last scan in 2010 showing  stability; felt to be benign.  . Cardiac defibrillator -dual  [facility]   . Atrial tachycardia-non sustained     a. Noted on 03/2012 interrogation.  Marland Kitchen A-fib     a. Noted on 12/2012 interrogation, placed on Apixaban.  Marland Kitchen PVC's (premature ventricular contractions)   . ICD (implantable cardiac defibrillator) in place   . Pacemaker   . Shortness of breath    Past Surgical History  Procedure Laterality Date  . Colon surgery    . Cardiac catheterization  2000  . Icd  12/2011    Graciela Husbands  . Cardiac defibrillator placement    . Appendectomy    . Tee without cardioversion N/A 03/30/2013    Procedure: TRANSESOPHAGEAL ECHOCARDIOGRAM (TEE);  Surgeon: Vesta Mixer, MD;  Location: [facility] ENDOSCOPY;  Service: Cardiovascular;  Laterality: N/A;  . Cardioversion  03/30/2013    Procedure: CARDIOVERSION;  Surgeon: Vesta Mixer, MD;  Location: [facility] ENDOSCOPY;  Service: Cardiovascular;;   Social History:  reports that he quit smoking about 29 years ago. His smoking use included Cigarettes. He has a 100 pack-year smoking history. He has never used smokeless tobacco. He reports that he does not drink alcohol or use illicit drugs.  Allergies  Allergen Reactions  . Atorvastatin Other (See Comments)    cough    Family History  Problem Relation Age of Onset  . Emphysema Mother   . Heart disease Mother     AVR  . Heart failure  Father     Died agwe 52     Prior to Admission medications   Medication Sig Start Date End Date Taking? Authorizing Provider  amiodarone (PACERONE) 200 MG tablet Take 1 tablet (200 mg total) by mouth daily. 05/16/13  Yes [name], MD  carvedilol (COREG) 6.25 MG tablet Take 1 tablet (6.25 mg total) by mouth 2 (two) times daily with a meal. 12/29/12  Yes Duke Salvia, MD  cholecalciferol (VITAMIN D) 1000 UNITS tablet Take 1,000 Units by mouth daily.   Yes Historical Provider, MD  finasteride (PROSCAR) 5 MG tablet Take 5 mg by mouth every morning.   Yes Historical Provider, MD   fluticasone (FLONASE) 50 MCG/ACT nasal spray Place 1 spray into the nose daily as needed. For congestion   Yes Historical Provider, MD  Fluticasone-Salmeterol (ADVAIR) 250-50 MCG/DOSE AEPB Inhale 1 puff into the lungs daily. 08/08/13  Yes Storm Frisk, MD  furosemide (LASIX) 40 MG tablet TAKE 1 TABLET BY MOUTH EVERY DAY 06/08/13  Yes [name], MD  levothyroxine (SYNTHROID, LEVOTHROID) 25 MCG tablet Take 25 mcg by mouth daily before breakfast.   Yes Historical Provider, MD  lisinopril (PRINIVIL,ZESTRIL) 40 MG tablet Take 40 mg by mouth daily.    Yes Historical Provider, MD  loratadine (CLARITIN) 10 MG tablet Take 10 mg by mouth daily.   Yes Historical Provider, MD  nitroGLYCERIN (NITROSTAT) 0.4 MG SL tablet Place 0.4 mg under the tongue every 5 (five) minutes as needed. For chest pain 11/12/11  Yes Rosalio Macadamia, NP  Omega-3 Fatty Acids (FISH OIL) 1200 MG CAPS Take 1 capsule by mouth 2 (two) times daily.     Yes Historical Provider, MD  pantoprazole (PROTONIX) 40 MG tablet Take 40 mg by mouth at bedtime.    Yes Historical Provider, MD  psyllium (METAMUCIL) 58.6 % powder Take 1 packet by mouth daily.    Yes Historical Provider, MD  Rivaroxaban (XARELTO) 20 MG TABS tablet Take 20 mg by mouth daily with supper. 06/15/13  Yes [name], MD  rosuvastatin (CRESTOR) 5 MG tablet Take 5 mg by mouth at bedtime.    Yes Historical Provider, MD  Tamsulosin HCl (FLOMAX) 0.4 MG CAPS Take 0.4 mg by mouth at bedtime.    Yes Historical Provider, MD  vitamin B-12 (CYANOCOBALAMIN) 1000 MCG tablet Take 1,000 mcg by mouth daily.   Yes Historical Provider, MD   Physical Exam: Filed Vitals:   09/22/13 1104  BP: 124/79  Pulse: 70  Temp: 98.2 F (36.8 C)  Resp: 18     General:  Alert pleasant oriented  Eyes: EOMI, NCAT  ENT: Throat clear, no JVD  Neck: No bruit  Cardiovascular: S1-S2 no murmur rub or gallop  Respiratory: Clear no added sound  Abdomen: Soft nontender nondistended mildly  tender in left hypogastrium no rebound or guarding  Skin: No lower extremity edema or swelling  Musculoskeletal: Range of motion intact  Psychiatric: Euthymic  Neurologic: Grossly intact moving all 4 limbs equally  Labs on Admission:  Basic Metabolic Panel:  Recent Labs Lab 09/22/13 1155  NA 139  K 3.9  CL 104  CO2 27  GLUCOSE 105*  BUN 16  CREATININE 1.31  CALCIUM 9.0   Liver Function Tests:  Recent Labs Lab 09/22/13 1155  AST 22  ALT 22  ALKPHOS 65  BILITOT 0.6  PROT 6.8  ALBUMIN 3.8   No results found for this basename: LIPASE, AMYLASE,  in the last 168 hours No results found  for this basename: AMMONIA,  in the last 168 hours CBC:  Recent Labs Lab 09/22/13 1155  WBC 7.4  HGB 13.2  HCT 38.3*  MCV 93.2  PLT 178   Cardiac Enzymes: No results found for this basename: CKTOTAL, CKMB, CKMBINDEX, TROPONINI,  in the last 168 hours  BNP (last 3 results)  Recent Labs  03/26/13 1139 03/27/13 0248  PROBNP 3488.0* 2908.0*   CBG: No results found for this basename: GLUCAP,  in the last 168 hours  Radiological Exams on Admission: No results found.  EKG: Independently reviewed. Appears to be in sinus rhythm without any ST-T wave changes  Assessment/Plan Principal Problem:   Acute lower GI bleeding-origin likely diverticular-patient has had what he tells me he is an operation in the 1980's where he had a questionable bowel obstruction with adherence of bowel to the bladder This is unlikely to represent small bowel obstruction given the history. I have already spoken to gastroenterology.  Appreciate input in advance Active Problems:   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-currently saturating well. Continue inhaled corticosteroid/LABA   PULMONARY NODULE-outpatient followup   History atrial fibrillation, chad2vasc~4= patient clearly is not a candidate for systemic anticoagulation given dark stool. Discussion will have to be  undertaken with cardiologist and gastroenterologist as an outpatient regarding risks of stroke or embolization from A. fib. At present however patient is in sinus rhythm-continue antiarrhythmics amiodarone 200 daily, dropped back dose of Coreg to 3.125 twice a day given low blood pressure   Chronic systolic heart failure-euvolemic compensated see above-hold Lasix right now.  Careful with IV fluids-received 1 L bolus in the emergency room. Continue 50 cc per hour normal saline   Hyperlipidemia-hold statin for now   Cardiomyopathy, nonischemic and ischemic-outpatient followup and workup   Corey Tyler [facility] Pager [phone]  If 7PM-7AM, please contact night-coverage [email or web address] Password [email or web address] 09/22/2013, 2:06 PM

## 2013-09-22 NOTE — Progress Notes (Signed)
Gave pt areo chamber for his Dulera.

## 2013-09-22 NOTE — ED Provider Notes (Signed)
CSN: [id number]     Arrival date & time 09/22/13  1040 History   First MD Initiated Contact with Patient 09/22/13 1133     No chief complaint on file.  (Consider location/radiation/quality/duration/timing/severity/associated sxs/prior Treatment) HPI [place] of rectal bleeding onset last night. Patient states he had possibly 6 episodes of diarrhea mixed with red blood. He also complained of nausea last night and tonight but none at present other symptoms include left lower quadrant pain radiating to left flank similar to diverticulitis she's had in the past though he has no pain at present. Patient also complains of generalized weakness since yesterday. No fever he admits to nausea, none now no vomiting. No other associated symptoms. Nothing makes symptoms but are worse. No treatment prior to coming here Past Medical History  Diagnosis Date  . Old myocardial infarction     a. ?Silent MI. b. Large fixed inferolateral defect c/w with prior infarct, no ischemia. c. Cath in 2000/2013 with only minimal CAD.  Marland Kitchen Hypertension   . COPD (chronic obstructive pulmonary disease)   . Hyperlipidemia   . Chronic systolic heart failure     EF down to 20 to 25% per echo November 2012  . NICM (nonischemic cardiomyopathy)     a. Normal cors 2000. b. Minimal plaque 2013.  . Pulmonary nodule, right     Last scan in 2010 showing stability; felt to be benign.  . Cardiac defibrillator -dual  [facility]   . Atrial tachycardia-non sustained     a. Noted on 03/2012 interrogation.  Marland Kitchen A-fib     a. Noted on 12/2012 interrogation, placed on Apixaban.  Marland Kitchen PVC's (premature ventricular contractions)   . ICD (implantable cardiac defibrillator) in place   . Pacemaker   . Shortness of breath    Past Surgical History  Procedure Laterality Date  . Colon surgery    . Cardiac catheterization  2000  . Icd  12/2011    Graciela Husbands  . Cardiac defibrillator placement    . Appendectomy    . Tee without cardioversion N/A 03/30/2013   Procedure: TRANSESOPHAGEAL ECHOCARDIOGRAM (TEE);  Surgeon: Vesta Mixer, MD;  Location: [facility] ENDOSCOPY;  Service: Cardiovascular;  Laterality: N/A;  . Cardioversion  03/30/2013    Procedure: CARDIOVERSION;  Surgeon: Vesta Mixer, MD;  Location: [facility] ENDOSCOPY;  Service: Cardiovascular;;   Family History  Problem Relation Age of Onset  . Emphysema Mother   . Heart disease Mother     AVR  . Heart failure Father     Died agwe 13   History  Substance Use Topics  . Smoking status: Former Smoker -- 2.50 packs/day for 40 years    Types: Cigarettes    Quit date: 11/24/1983  . Smokeless tobacco: Never Used     Comment: 2 1/2 ppd x 40 years  . Alcohol Use: No    Review of Systems  HENT: Negative.   Respiratory: Negative.   Cardiovascular: Negative.   Gastrointestinal: Positive for nausea, abdominal pain and blood in stool.  Musculoskeletal: Negative.   Skin: Negative.   Neurological: Positive for weakness.  Psychiatric/Behavioral: Negative.   All other systems reviewed and are negative.    Allergies  Atorvastatin  Home Medications   Current Outpatient Rx  Name  Route  Sig  Dispense  Refill  . amiodarone (PACERONE) 200 MG tablet   Oral   Take 1 tablet (200 mg total) by mouth daily.   90 tablet   3   . carvedilol (COREG) 6.25  MG tablet   Oral   Take 1 tablet (6.25 mg total) by mouth 2 (two) times daily with a meal.   60 tablet   11   . cholecalciferol (VITAMIN D) 1000 UNITS tablet   Oral   Take 1,000 Units by mouth daily.         . finasteride (PROSCAR) 5 MG tablet   Oral   Take 5 mg by mouth every morning.         . Fluticasone-Salmeterol (ADVAIR) 250-50 MCG/DOSE AEPB   Inhalation   Inhale 1 puff into the lungs daily.         . furosemide (LASIX) 40 MG tablet      TAKE 1 TABLET BY MOUTH EVERY DAY         . levothyroxine (SYNTHROID, LEVOTHROID) 25 MCG tablet   Oral   Take 25 mcg by mouth daily before breakfast.         . lisinopril  (PRINIVIL,ZESTRIL) 40 MG tablet   Oral   Take 40 mg by mouth daily.          Marland Kitchen loratadine (CLARITIN) 10 MG tablet   Oral   Take 10 mg by mouth daily.         . pantoprazole (PROTONIX) 40 MG tablet   Oral   Take 40 mg by mouth at bedtime.          . psyllium (METAMUCIL) 58.6 % powder   Oral   Take 1 packet by mouth daily.          . Rivaroxaban (XARELTO) 20 MG TABS tablet   Oral   Take 20 mg by mouth daily with supper.         . rosuvastatin (CRESTOR) 5 MG tablet   Oral   Take 5 mg by mouth at bedtime.          . Tamsulosin HCl (FLOMAX) 0.4 MG CAPS   Oral   Take 0.4 mg by mouth at bedtime.          . vitamin B-12 (CYANOCOBALAMIN) 1000 MCG tablet   Oral   Take 1,000 mcg by mouth daily.         . fluticasone (FLONASE) 50 MCG/ACT nasal spray   Nasal   Place 1 spray into the nose daily as needed. For congestion         . nitroGLYCERIN (NITROSTAT) 0.4 MG SL tablet   Sublingual   Place 0.4 mg under the tongue every 5 (five) minutes as needed. For chest pain         . Omega-3 Fatty Acids (FISH OIL) 1200 MG CAPS   Oral   Take 1 capsule by mouth 2 (two) times daily.            BP 124/79  Pulse 70  Temp(Src) 98.2 F (36.8 C) (Oral)  Resp 18  SpO2 98% Physical Exam  Nursing note and vitals reviewed. Constitutional: He is oriented to person, place, and time. He appears well-developed and well-nourished. No distress.  HENT:  Head: Normocephalic and atraumatic.  Eyes: Conjunctivae are normal. Pupils are equal, round, and reactive to light.  Neck: Neck supple. No tracheal deviation present. No thyromegaly present.  Cardiovascular: Normal rate and regular rhythm.   No murmur heard. Pulmonary/Chest: Effort normal and breath sounds normal.  Abdominal: Soft. Bowel sounds are normal. He exhibits no distension. There is no tenderness.  Genitourinary: Guaiac positive stool.  Normal tone bright red stool  Musculoskeletal: Normal range of  motion. He  exhibits no edema and no tenderness.  Neurological: He is alert and oriented to person, place, and time. Coordination normal.  Skin: Skin is warm and dry. No rash noted.  Psychiatric: He has a normal mood and affect.    ED Course  Procedures (including critical care time) Labs Review Labs Reviewed  OCCULT BLOOD, POC DEVICE - Abnormal; Notable for the following:    Fecal Occult Bld POSITIVE (*)    All other components within normal limits  PROTIME-INR  CBC  COMPREHENSIVE METABOLIC PANEL   Imaging Review No results found.  EKG Interpretation     Ventricular Rate:  65 PR Interval:  173 QRS Duration: 109 QT Interval:  441 QTC Calculation: 459 R Axis:   4 Text Interpretation:  Normal sinus rhythm Poor R wave progression Nonspecific ST and T wave abnormality           Results for orders placed during the hospital encounter of 09/22/13  PROTIME-INR      Result Value Range   Prothrombin Time 18.2 (*) 11.6 - 15.2 seconds   INR 1.55 (*) 0.00 - 1.49  CBC      Result Value Range   WBC 7.4  4.0 - 10.5 K/uL   RBC 4.11 (*) 4.22 - 5.81 MIL/uL   Hemoglobin 13.2  13.0 - 17.0 g/dL   HCT 16.1 (*) 09.6 - 04.5 %   MCV 93.2  78.0 - 100.0 fL   MCH 32.1  26.0 - 34.0 pg   MCHC 34.5  30.0 - 36.0 g/dL   RDW 40.9  81.1 - 91.4 %   Platelets 178  150 - 400 K/uL  COMPREHENSIVE METABOLIC PANEL      Result Value Range   Sodium 139  135 - 145 mEq/L   Potassium 3.9  3.5 - 5.1 mEq/L   Chloride 104  96 - 112 mEq/L   CO2 27  19 - 32 mEq/L   Glucose, Bld 105 (*) 70 - 99 mg/dL   BUN 16  6 - 23 mg/dL   Creatinine, Ser 7.82  0.50 - 1.35 mg/dL   Calcium 9.0  8.4 - 95.6 mg/dL   Total Protein 6.8  6.0 - 8.3 g/dL   Albumin 3.8  3.5 - 5.2 g/dL   AST 22  0 - 37 U/L   ALT 22  0 - 53 U/L   Alkaline Phosphatase 65  39 - 117 U/L   Total Bilirubin 0.6  0.3 - 1.2 mg/dL   GFR calc non Af Amer 52 (*) >90 mL/min   GFR calc Af Amer 61 (*) >90 mL/min  OCCULT BLOOD, POC DEVICE      Result Value Range    Fecal Occult Bld POSITIVE (*) NEGATIVE   No results found.   MDM  No diagnosis found. In light of patient's low ejection fraction. Heart disease, and  active rectal bleeding while on blood thinners I feel the patient should be observed in-hospital . Suggests your hemoglobin. I've spoken withLebuaer as her neurology service, who suggests inpatient stay with hospitalist service. Hospitalist can consult gastroenterology service as needed Diagnosis gastrointestinal bleeding    Doug Sou, MD 09/22/13 1410

## 2013-09-22 NOTE — Consult Note (Signed)
Referring Provider: No ref. provider found Primary Care Physician:  [name], MD Primary Gastroenterologist:  Dr. Arlyce Dice  Reason for Consultation:  Rectal bleeding  HPI: Corey Tyler is a 72 y.o. male with PMH of hypertension, hyperlipidemia, COPD, atrial fibrillation, status-post placement of a pacemaker-defibrillator, history of silent MI with minimal coronary artery disease on catheterization, and some type of colon surgery with a partial colon resection in the past which he could not elaborate on (? Complications from diverticulitis with fistula).  He presented to [facility] hospital today, 10/31, with complaints of rectal bleeding. He states that starting yesterday morning he had several episodes of diarrhea with blood mixed in. He then had several more episodes last night and 2 more episodes this morning before deciding to get evaluated in the emergency department. He says the stools were somewhat of a dark color on a few occasions, but the blood that was definitely red. He had some mild nausea this morning, but denies any vomiting.  Has some mild left lower quadrant discomfort, but reports that has been present on and off for quite some time. Normally his bowel movements are quite regular and diarrhea is not a frequent occurrence for him. He denies ever seeing blood in his stool like this previously. No changes in appetite or unintentional weight loss. He takes daily Xarelto and did actually take that this morning.  No regular NSAID use.  His last colonoscopy was Dr. Arlyce Dice in November of 2011 at which time he was found to have a possible segmental colitis in the descending colon. Biopsy showed a mildly active chronic colitis. He also had moderate diverticulosis. He believes he was told he needed another colonoscopy in 5 years from that time.  Upon evaluation in the emergency department his hemoglobin was stable from his baseline at 13.2 g.  INR was 1.55 and CMP is relatively unremarkable. He was occult  blood positive in the emergency department. He has not passed any stool or blood since presenting to the hospital.  It is interesting that he had a colonoscopy in 2002 with biopsies and also showed some chronic active colitis with features of inflammatory bowel disease favoring ulcerative colitis.    Past Medical History  Diagnosis Date  . Old myocardial infarction     a. ?Silent MI. b. Large fixed inferolateral defect c/w with prior infarct, no ischemia. c. Cath in 2000/2013 with only minimal CAD.  Marland Kitchen Hypertension   . COPD (chronic obstructive pulmonary disease)   . Hyperlipidemia   . Chronic systolic heart failure     EF down to 20 to 25% per echo November 2012  . NICM (nonischemic cardiomyopathy)     a. Normal cors 2000. b. Minimal plaque 2013.  . Pulmonary nodule, right     Last scan in 2010 showing stability; felt to be benign.  . Cardiac defibrillator -dual  [facility]   . Atrial tachycardia-non sustained     a. Noted on 03/2012 interrogation.  Marland Kitchen A-fib     a. Noted on 12/2012 interrogation, placed on Apixaban.  Marland Kitchen PVC's (premature ventricular contractions)   . ICD (implantable cardiac defibrillator) in place   . Pacemaker   . Shortness of breath     Past Surgical History  Procedure Laterality Date  . Colon surgery    . Cardiac catheterization  2000  . Icd  12/2011    Graciela Husbands  . Cardiac defibrillator placement    . Appendectomy    . Tee without cardioversion N/A 03/30/2013  Procedure: TRANSESOPHAGEAL ECHOCARDIOGRAM (TEE);  Surgeon: Vesta Mixer, MD;  Location: [facility] ENDOSCOPY;  Service: Cardiovascular;  Laterality: N/A;  . Cardioversion  03/30/2013    Procedure: CARDIOVERSION;  Surgeon: Vesta Mixer, MD;  Location: [facility] ENDOSCOPY;  Service: Cardiovascular;;    Prior to Admission medications   Medication Sig Start Date End Date Taking? Authorizing Provider  amiodarone (PACERONE) 200 MG tablet Take 1 tablet (200 mg total) by mouth daily. 05/16/13  Yes [name], MD   carvedilol (COREG) 6.25 MG tablet Take 1 tablet (6.25 mg total) by mouth 2 (two) times daily with a meal. 12/29/12  Yes Duke Salvia, MD  cholecalciferol (VITAMIN D) 1000 UNITS tablet Take 1,000 Units by mouth daily.   Yes Historical Provider, MD  finasteride (PROSCAR) 5 MG tablet Take 5 mg by mouth every morning.   Yes Historical Provider, MD  fluticasone (FLONASE) 50 MCG/ACT nasal spray Place 1 spray into the nose daily as needed. For congestion   Yes Historical Provider, MD  Fluticasone-Salmeterol (ADVAIR) 250-50 MCG/DOSE AEPB Inhale 1 puff into the lungs daily. 08/08/13  Yes Storm Frisk, MD  furosemide (LASIX) 40 MG tablet TAKE 1 TABLET BY MOUTH EVERY DAY 06/08/13  Yes [name], MD  levothyroxine (SYNTHROID, LEVOTHROID) 25 MCG tablet Take 25 mcg by mouth daily before breakfast.   Yes Historical Provider, MD  lisinopril (PRINIVIL,ZESTRIL) 40 MG tablet Take 40 mg by mouth daily.    Yes Historical Provider, MD  loratadine (CLARITIN) 10 MG tablet Take 10 mg by mouth daily.   Yes Historical Provider, MD  nitroGLYCERIN (NITROSTAT) 0.4 MG SL tablet Place 0.4 mg under the tongue every 5 (five) minutes as needed. For chest pain 11/12/11  Yes Rosalio Macadamia, NP  Omega-3 Fatty Acids (FISH OIL) 1200 MG CAPS Take 1 capsule by mouth 2 (two) times daily.     Yes Historical Provider, MD  pantoprazole (PROTONIX) 40 MG tablet Take 40 mg by mouth at bedtime.    Yes Historical Provider, MD  psyllium (METAMUCIL) 58.6 % powder Take 1 packet by mouth daily.    Yes Historical Provider, MD  Rivaroxaban (XARELTO) 20 MG TABS tablet Take 20 mg by mouth daily with supper. 06/15/13  Yes [name], MD  rosuvastatin (CRESTOR) 5 MG tablet Take 5 mg by mouth at bedtime.    Yes Historical Provider, MD  Tamsulosin HCl (FLOMAX) 0.4 MG CAPS Take 0.4 mg by mouth at bedtime.    Yes Historical Provider, MD  vitamin B-12 (CYANOCOBALAMIN) 1000 MCG tablet Take 1,000 mcg by mouth daily.   Yes Historical Provider, MD    No  current facility-administered medications for this encounter.   Current Outpatient Prescriptions  Medication Sig Dispense Refill  . amiodarone (PACERONE) 200 MG tablet Take 1 tablet (200 mg total) by mouth daily.  90 tablet  3  . carvedilol (COREG) 6.25 MG tablet Take 1 tablet (6.25 mg total) by mouth 2 (two) times daily with a meal.  60 tablet  11  . cholecalciferol (VITAMIN D) 1000 UNITS tablet Take 1,000 Units by mouth daily.      . finasteride (PROSCAR) 5 MG tablet Take 5 mg by mouth every morning.      . fluticasone (FLONASE) 50 MCG/ACT nasal spray Place 1 spray into the nose daily as needed. For congestion      . Fluticasone-Salmeterol (ADVAIR) 250-50 MCG/DOSE AEPB Inhale 1 puff into the lungs daily.      . furosemide (LASIX) 40 MG tablet TAKE 1  TABLET BY MOUTH EVERY DAY      . levothyroxine (SYNTHROID, LEVOTHROID) 25 MCG tablet Take 25 mcg by mouth daily before breakfast.      . lisinopril (PRINIVIL,ZESTRIL) 40 MG tablet Take 40 mg by mouth daily.       Marland Kitchen loratadine (CLARITIN) 10 MG tablet Take 10 mg by mouth daily.      . nitroGLYCERIN (NITROSTAT) 0.4 MG SL tablet Place 0.4 mg under the tongue every 5 (five) minutes as needed. For chest pain      . Omega-3 Fatty Acids (FISH OIL) 1200 MG CAPS Take 1 capsule by mouth 2 (two) times daily.        . pantoprazole (PROTONIX) 40 MG tablet Take 40 mg by mouth at bedtime.       . psyllium (METAMUCIL) 58.6 % powder Take 1 packet by mouth daily.       . Rivaroxaban (XARELTO) 20 MG TABS tablet Take 20 mg by mouth daily with supper.      . rosuvastatin (CRESTOR) 5 MG tablet Take 5 mg by mouth at bedtime.       . Tamsulosin HCl (FLOMAX) 0.4 MG CAPS Take 0.4 mg by mouth at bedtime.       . vitamin B-12 (CYANOCOBALAMIN) 1000 MCG tablet Take 1,000 mcg by mouth daily.        Allergies as of 09/22/2013 - Review Complete 09/22/2013  Allergen Reaction Noted  . Atorvastatin Other (See Comments) 09/16/2010    Family History  Problem Relation Age of  Onset  . Emphysema Mother   . Heart disease Mother     AVR  . Heart failure Father     Died agwe 104    History   Social History  . Marital Status: Married    Spouse Name: N/A    Number of Children: 2  . Years of Education: N/A   Occupational History  . Retired     Airline pilot  . Works [employer]   Social History Main Topics  . Smoking status: Former Smoker -- 2.50 packs/day for 40 years    Types: Cigarettes    Quit date: 11/24/1983  . Smokeless tobacco: Never Used     Comment: 2 1/2 ppd x 40 years  . Alcohol Use: No  . Drug Use: No  . Sexual Activity: Not on file   Other Topics Concern  . Not on file   Social History Narrative   Still works at [employer] part-time   Lives with Wife in [place]    Review of Systems: Ten point ROS is O/W negative except as mentioned in HPI.  Physical Exam: Vital signs in last 24 hours: Temp:  [98.2 F (36.8 C)] 98.2 F (36.8 C) (10/31 1104) Pulse Rate:  [60-78] 78 (10/31 1430) Resp:  [18] 18 (10/31 1104) BP: (104-124)/(65-79) 105/65 mmHg (10/31 1430) SpO2:  [98 %] 98 % (10/31 1104)   General:   Alert, Well-developed, well-nourished, pleasant and cooperative in NAD Head:  Normocephalic and atraumatic. Eyes:  Sclera clear, no icterus.  Conjunctiva pink. Ears:  Normal auditory acuity. Mouth:  No deformity or lesions.   Lungs:  Clear throughout to auscultation.  No wheezes, crackles, or rhonchi.  Heart:  Regular rate and rhythm; no murmurs, clicks, rubs, or gallops. Abdomen:  Soft, non-distended.  BS present.  Non-tender.   Rectal:  Deferred.  Heme positive in the ED.  Msk:  Symmetrical without gross deformities. Pulses:  Normal pulses noted. Extremities:  Without clubbing or  edema. Neurologic:  Alert and  oriented x4;  grossly normal neurologically. Skin:  Intact without significant lesions or rashes. Psych:  Alert and cooperative. Normal mood and affect.  Lab Results:  Recent Labs  09/22/13 1155  WBC 7.4   HGB 13.2  HCT 38.3*  PLT 178   BMET  Recent Labs  09/22/13 1155  NA 139  K 3.9  CL 104  CO2 27  GLUCOSE 105*  BUN 16  CREATININE 1.31  CALCIUM 9.0   LFT  Recent Labs  09/22/13 1155  PROT 6.8  ALBUMIN 3.8  AST 22  ALT 22  ALKPHOS 65  BILITOT 0.6   PT/INR  Recent Labs  09/22/13 1155  LABPROT 18.2*  INR 1.55*   IMPRESSION:  -GI bleed:  Suspect lower GI source.  Possibly diverticular vs a chronic colitis of some sort as seen on previous colonoscopies; he has never been treated for any type of chronic colitis in the past, however. Hemoglobin is within normal range and at his baseline. -Atrial fibrillation with ICD and on Xarelto. Last dose 10/31.    PLAN: -Monitor hemoglobin and transfuse if needed. -Will try Bentyl for abdominal cramping/discomfort. -We are not currently planning for any endoscopic evaluation as an inpatient unless patient's symptoms were to persist or worsen. Otherwise he can followup as an outpatient with Dr. Arlyce Dice on November 10 and possibly schedule an outpatient colonoscopy with him at that time.   ZEHR, JESSICA D.  09/22/2013, 2:47 PM  Pager number [phone]  GI ATTENDING  History, laboratories, x-rays, prior endoscopy reports and pathology reviewed. Patient personally seen and examined. Wife in room. Interesting history. Remotely he had complicated diverticulitis with colovesicular fistula requiring surgical intervention. In more recent years, intermittent "colitis" in the region of diverticular disease in the left. He intermittently has some left-sided discomfort. This has been worse recently. 2 days ago developed diarrheal stools with blood. This is unusual. For the most part 2 formed bowel movements daily. He is clinically stable, but on blood thinners as noted. At this point, I would check stools for pathogens as this may be infectious. As well, will need to watch his blood counts. He will likely need relook colonoscopy if symptoms  persist for greater than 72 hours to rule out idiopathic or other colitis. Supportive care in the interim. We have prescribed antispasmodic for discomfort. Dr. Loreta Ave will follow in weekend.  Wilhemina Bonito. Eda Keys., M.D. [facility] Division of Gastroenterology

## 2013-09-22 NOTE — ED Notes (Signed)
Admitting MD at bedside.

## 2013-09-22 NOTE — ED Notes (Signed)
Pt c/o diarrhea with bright red blood in stool since yesterday morning. Pt reports 10 episodes of bloody diarrhea. Pt also reports left side lower abd.pain which radiates to back. Pt is on xarelto.

## 2013-09-23 DIAGNOSIS — J438 Other emphysema: Secondary | ICD-10-CM

## 2013-09-23 DIAGNOSIS — I10 Essential (primary) hypertension: Secondary | ICD-10-CM

## 2013-09-23 LAB — CBC
MCH: 31.8 pg (ref 26.0–34.0)
MCV: 93 fL (ref 78.0–100.0)
Platelets: 147 10*3/uL — ABNORMAL LOW (ref 150–400)
RDW: 14 % (ref 11.5–15.5)
WBC: 6.7 10*3/uL (ref 4.0–10.5)

## 2013-09-23 LAB — COMPREHENSIVE METABOLIC PANEL
ALT: 17 U/L (ref 0–53)
AST: 15 U/L (ref 0–37)
Albumin: 3.1 g/dL — ABNORMAL LOW (ref 3.5–5.2)
Alkaline Phosphatase: 55 U/L (ref 39–117)
BUN: 11 mg/dL (ref 6–23)
CO2: 25 mEq/L (ref 19–32)
Chloride: 107 mEq/L (ref 96–112)
Creatinine, Ser: 1.2 mg/dL (ref 0.50–1.35)
GFR calc non Af Amer: 58 mL/min — ABNORMAL LOW (ref >90)
Glucose, Bld: 86 mg/dL (ref 70–99)
Potassium: 3.5 mEq/L (ref 3.5–5.1)
Total Bilirubin: 0.6 mg/dL (ref 0.3–1.2)

## 2013-09-23 LAB — PROTIME-INR: Prothrombin Time: 14.5 seconds (ref 11.6–15.2)

## 2013-09-23 MED ORDER — ACETAMINOPHEN 325 MG PO TABS
650.0000 mg | ORAL_TABLET | Freq: Four times a day (QID) | ORAL | Status: DC | PRN
  Administered 2013-09-23: 650 mg via ORAL
  Filled 2013-09-23: qty 2

## 2013-09-23 MED ORDER — FLUTICASONE PROPIONATE 50 MCG/ACT NA SUSP
1.0000 | Freq: Every day | NASAL | Status: DC | PRN
  Administered 2013-09-23: 1 via NASAL
  Filled 2013-09-23: qty 16

## 2013-09-23 NOTE — Progress Notes (Signed)
Spoke with Dr. Loreta Ave regarding patient's bloody BM and c-diff by PCR.  New orders received to place patient back on a clear liquid diet and to d/c c-diff test and precautions.  Stated patient will most likely be scoped on Monday by [place].  Will continue to monitor and pass information on to night RN.

## 2013-09-23 NOTE — Progress Notes (Signed)
Cross cover [facility]GI Subjective: Patient seems to be doing somewhat better today. He has not had a BM today. He denies having any abdominal pain, nausea or vomiting. He wants to eat regular food. No fever or chills.   Objective: Vital signs in last 24 hours: Temp:  [98.1 F (36.7 C)-98.6 F (37 C)] 98.6 F (37 C) (11/01 0456) Pulse Rate:  [65-71] 71 (11/01 1446) Resp:  [18] 18 (11/01 0456) BP: (125-140)/(58-79) 125/58 mmHg (11/01 1446) SpO2:  [92 %-94 %] 94 % (11/01 1446) Last BM Date: 09/22/13  Intake/Output from previous day: 10/31 0701 - 11/01 0700 In: 601.7 [P.O.:480; I.V.:121.7] Out: 1250 [Urine:1250] Intake/Output this shift: Total I/O In: 243 [P.O.:240; I.V.:3] Out: -   General appearance: alert, cooperative, appears stated age and no distress Resp: clear to auscultation bilaterally Cardio: regular rate and rhythm, S1, S2 normal, no murmur, click, rub or gallop GI: soft, non-tender; bowel sounds normal; no masses,  no organomegaly Extremities: extremities normal, atraumatic, no cyanosis or edema  Lab Results:  Recent Labs  09/22/13 1155 09/23/13 0359  WBC 7.4 6.7  HGB 13.2 11.3*  HCT 38.3* 33.0*  PLT 178 147*   BMET  Recent Labs  09/22/13 1155 09/23/13 0359  NA 139 139  K 3.9 3.5  CL 104 107  CO2 27 25  GLUCOSE 105* 86  BUN 16 11  CREATININE 1.31 1.20  CALCIUM 9.0 8.8   LFT  Recent Labs  09/23/13 0359  PROT 5.7*  ALBUMIN 3.1*  AST 15  ALT 17  ALKPHOS 55  BILITOT 0.6   PT/INR  Recent Labs  09/22/13 1155 09/23/13 0359  LABPROT 18.2* 14.5  INR 1.55* 1.15   Medications: I have reviewed the patient's current medications.  Assessment/Plan: Acute GI bleed in a 72 year old white male with segmental colitis. Currently stable. Continue supportive care.  LOS: 1 day   Jervis Trapani 09/23/2013, 4:00 PM

## 2013-09-23 NOTE — Progress Notes (Signed)
Patient had one episode of medium, formed, bloody, stool with possible clots.  Stool specimen sent to lab for c-diff PCR and stool culture.  Lab refused c-diff test stating that per [facility] policy formed stool will not be tested, only loose/runny/diarrhea stool will be tested for c-diff. Dr. Loreta Ave paged, no answer.  Dr. Suanne Marker paged, notified.  Orders received to re-enter c-diff test in case patient has diarrhea overnight.  Will continue to monitor patient.

## 2013-09-23 NOTE — Progress Notes (Signed)
[facility] PROGRESS NOTE  Corey Tyler YQM:[id number] DOB: January 30, 1941 DOA: 09/22/2013 PCP: [name], MD  Assessment/Plan: Present on Admission:  . Acute lower GI bleeding -hgb remains stable and no further bleeding so far today -appreciate GI assistance will continue to monitor hh, follow and transfuse as appropriate -started on PO per GI today, will follow for diet tolerance  -noted he has h/o segmental colitis . 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 PULMONARY NODULE -outpt follow up . Cardiomyopathy, nonischemic and ischemic/Chronic systolic heart failure -stable, no evidence of fluid overload -continue carvedilol and amio . Hyperlipidemia      Code Status: Full Family Communication: wife at bedside  Disposition Plan:    Consultants:  GI  Procedures:  none  Antibiotics:    HPI/Subjective: States doing well today, denies N/V, also no abd pain and no further bleeding  Objective: Filed Vitals:   09/23/13 0832  BP: 139/79  Pulse: 68  Temp:   Resp:     Intake/Output Summary (Last 24 hours) at 09/23/13 1319 Last data filed at 09/23/13 1012  Gross per 24 hour  Intake 844.67 ml  Output   1250 ml  Net -405.33 ml   Filed Weights   09/22/13 1520  Weight: 92.534 kg (204 lb)    Exam:  General: alert & oriented x 3 In NAD Cardiovascular: RRR, nl S1 s2 Respiratory: CTAB Abdomen: soft +BS NT/ND, no masses palpable Extremities: No cyanosis and no edema   Data Reviewed: Basic Metabolic Panel:  Recent Labs Lab 09/22/13 1155 09/23/13 0359  NA 139 139  K 3.9 3.5  CL 104 107  CO2 27 25  GLUCOSE 105* 86  BUN 16 11  CREATININE 1.31 1.20  CALCIUM 9.0 8.8   Liver Function Tests:  Recent Labs Lab 09/22/13 1155 09/23/13 0359  AST 22 15  ALT 22 17  ALKPHOS 65 55  BILITOT 0.6 0.6  PROT 6.8 5.7*  ALBUMIN 3.8 3.1*   No results found for this basename: LIPASE, AMYLASE,   in the last 168 hours No results found for this basename: AMMONIA,  in the last 168 hours CBC:  Recent Labs Lab 09/22/13 1155 09/23/13 0359  WBC 7.4 6.7  HGB 13.2 11.3*  HCT 38.3* 33.0*  MCV 93.2 93.0  PLT 178 147*   Cardiac Enzymes: No results found for this basename: CKTOTAL, CKMB, CKMBINDEX, TROPONINI,  in the last 168 hours BNP (last 3 results)  Recent Labs  03/26/13 1139 03/27/13 0248  PROBNP 3488.0* 2908.0*   CBG: No results found for this basename: GLUCAP,  in the last 168 hours  No results found for this or any previous visit (from the past 240 hour(s)).   Studies: No results found.  Scheduled Meds: . amiodarone  200 mg Oral Daily  . carvedilol  3.125 mg Oral BID WC  . dicyclomine  10 mg Oral TID AC  . finasteride  5 mg Oral q morning - 10a  . levothyroxine  25 mcg Oral QAC breakfast  . mometasone-formoterol  2 puff Inhalation BID  . sodium chloride  3 mL Intravenous Q12H  . 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 If 7PM-7AM, please contact night-coverage at [email or web address], password [facility] 09/23/2013, 1:19 PM  LOS: 1 day

## 2013-09-24 DIAGNOSIS — I4891 Unspecified atrial fibrillation: Secondary | ICD-10-CM

## 2013-09-24 LAB — CBC
HCT: 33.1 % — ABNORMAL LOW (ref 39.0–52.0)
MCH: 31.7 pg (ref 26.0–34.0)
MCV: 93.8 fL (ref 78.0–100.0)
Platelets: 143 10*3/uL — ABNORMAL LOW (ref 150–400)
RDW: 14.1 % (ref 11.5–15.5)
WBC: 5.9 10*3/uL (ref 4.0–10.5)

## 2013-09-24 MED ORDER — PEG 3350-KCL-NA BICARB-NACL 420 G PO SOLR
4000.0000 mL | Freq: Once | ORAL | Status: AC
  Administered 2013-09-24: 4000 mL via ORAL

## 2013-09-24 MED ORDER — DIPHENHYDRAMINE HCL 25 MG PO CAPS
ORAL_CAPSULE | ORAL | Status: AC
  Administered 2013-09-24: 25 mg via ORAL
  Filled 2013-09-24: qty 1

## 2013-09-24 NOTE — Progress Notes (Signed)
Cross cover [facility]  Subjective: Since I last evaluated the patient, he seems to be fairly stable He had one bloody BM with clots, last night after a he was started on a regular diet for dinner. He is very upset about being in the hospital all weekend long with nothing done for him. He claims he is very angry about wasting money being here in the hospital wasting money. On clear liquids now. He denies having any further rectal bleeding or melan. There are no complaints of nausea, vomiting or abdominal pain. His last colonoscopy was in 2011. He has been diagnosed with a 'segmental colitis"  In the past and with UC in 2002 but the exact diagnosis is still unclear.   Objective: Vital signs in last 24 hours: Temp:  [98.2 F (36.8 C)-98.5 F (36.9 C)] 98.2 F (36.8 C) (11/02 0505) Pulse Rate:  [64-71] 66 (11/02 0505) Resp:  [18] 18 (11/02 0505) BP: (125-141)/(47-62) 141/47 mmHg (11/02 0505) SpO2:  [92 %-98 %] 98 % (11/02 0741) Last BM Date: 09/22/13  Intake/Output from previous day: 11/01 0701 - 11/02 0700 In: 843 [P.O.:840; I.V.:3] Out: 1250 [Urine:1250] Intake/Output this shift: Total I/O In: 360 [P.O.:360] Out: 400 [Urine:400]  General appearance: alert, cooperative, appears stated age and no distress Resp: clear to auscultation bilaterally Cardio: regular rate and rhythm, S1, S2 normal, no murmur, click, rub or gallop GI: soft, non-tender; bowel sounds normal; no masses,  no organomegaly Extremities: extremities normal, atraumatic, no cyanosis or edema  Lab Results:  Recent Labs  09/22/13 1155 09/23/13 0359 09/24/13 0350  WBC 7.4 6.7 5.9  HGB 13.2 11.3* 11.2*  HCT 38.3* 33.0* 33.1*  PLT 178 147* 143*   BMET  Recent Labs  09/22/13 1155 09/23/13 0359  NA 139 139  K 3.9 3.5  CL 104 107  CO2 27 25  GLUCOSE 105* 86  BUN 16 11  CREATININE 1.31 1.20  CALCIUM 9.0 8.8   LFT  Recent Labs  09/23/13 0359  PROT 5.7*  ALBUMIN 3.1*  AST 15  ALT 17  ALKPHOS 55   BILITOT 0.6   PT/INR  Recent Labs  09/22/13 1155 09/23/13 0359  LABPROT 18.2* 14.5  INR 1.55* 1.15   Studies/Results: No results found.  Medications: I have reviewed the patient's current medications.  Assessment/Plan: 1) Rectal bleeding with a drop in the hemoglobin from 13.2-11.2 gm/dl  Will prep for a colonoscopy tomorrow.  LOS: 2 days   Aharon Carriere 09/24/2013, 12:42 PM

## 2013-09-24 NOTE — Progress Notes (Signed)
[facility] PROGRESS NOTE  Corey Tyler ZOX:[id number] DOB: 12-05-1940 DOA: 09/22/2013 PCP: [name], MD  Assessment/Plan: Present on Admission:  . Acute lower GI bleeding -hgb remains stable today -appreciate GI assistance will continue to monitor hh, follow and transfuse as appropriate -pt had bloody BM last pm and GI planning colonscopy in am -noted he has h/o segmental colitis . 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 PULMONARY NODULE -outpt follow up . Cardiomyopathy, nonischemic and ischemic/Chronic systolic heart failure -stable, no evidence of fluid overload -continue carvedilol and amio . Hyperlipidemia      Code Status: Full Family Communication: wife at bedside  Disposition Plan:    Consultants:  GI  Procedures:  none  Antibiotics:    HPI/Subjective: Pt had bloody BM last pm, denies abd pain and no N/V  Objective: Filed Vitals:   09/24/13 0505  BP: 141/47  Pulse: 66  Temp: 98.2 F (36.8 C)  Resp: 18    Intake/Output Summary (Last 24 hours) at 09/24/13 1056 Last data filed at 09/24/13 0900  Gross per 24 hour  Intake    960 ml  Output   1650 ml  Net   -690 ml   Filed Weights   09/22/13 1520  Weight: 92.534 kg (204 lb)    Exam:  General: alert & oriented x 3 In NAD Cardiovascular: RRR, nl S1 s2 Respiratory: CTAB Abdomen: soft +BS NT/ND, no masses palpable Extremities: No cyanosis and no edema   Data Reviewed: Basic Metabolic Panel:  Recent Labs Lab 09/22/13 1155 09/23/13 0359  NA 139 139  K 3.9 3.5  CL 104 107  CO2 27 25  GLUCOSE 105* 86  BUN 16 11  CREATININE 1.31 1.20  CALCIUM 9.0 8.8   Liver Function Tests:  Recent Labs Lab 09/22/13 1155 09/23/13 0359  AST 22 15  ALT 22 17  ALKPHOS 65 55  BILITOT 0.6 0.6  PROT 6.8 5.7*  ALBUMIN 3.8 3.1*   No results found for this basename: LIPASE, AMYLASE,  in the last 168 hours No results found  for this basename: AMMONIA,  in the last 168 hours CBC:  Recent Labs Lab 09/22/13 1155 09/23/13 0359 09/24/13 0350  WBC 7.4 6.7 5.9  HGB 13.2 11.3* 11.2*  HCT 38.3* 33.0* 33.1*  MCV 93.2 93.0 93.8  PLT 178 147* 143*   Cardiac Enzymes: No results found for this basename: CKTOTAL, CKMB, CKMBINDEX, TROPONINI,  in the last 168 hours BNP (last 3 results)  Recent Labs  03/26/13 1139 03/27/13 0248  PROBNP 3488.0* 2908.0*   CBG: No results found for this basename: GLUCAP,  in the last 168 hours  No results found for this or any previous visit (from the past 240 hour(s)).   Studies: No results found.  Scheduled Meds: . amiodarone  200 mg Oral Daily  . carvedilol  3.125 mg Oral BID WC  . dicyclomine  10 mg Oral TID AC  . finasteride  5 mg Oral q morning - 10a  . levothyroxine  25 mcg Oral QAC breakfast  . mometasone-formoterol  2 puff Inhalation BID  . sodium chloride  3 mL Intravenous Q12H  . 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 If 7PM-7AM, please contact night-coverage at [email or web address], password [facility] 09/24/2013, 10:56 AM  LOS: 2 days

## 2013-09-24 NOTE — Progress Notes (Signed)
Utilization Review completed.  

## 2013-09-25 ENCOUNTER — Encounter (HOSPITAL_COMMUNITY)
Admission: EM | Disposition: A | Discharge status: Home / Self Care | Payer: Self-pay | Source: Home / Self Care | Attending: Internal Medicine

## 2013-09-25 ENCOUNTER — Encounter (HOSPITAL_COMMUNITY): Payer: Self-pay | Admitting: *Deleted

## 2013-09-25 DIAGNOSIS — Z95810 Presence of automatic (implantable) cardiac defibrillator: Secondary | ICD-10-CM

## 2013-09-25 DIAGNOSIS — I5022 Chronic systolic (congestive) heart failure: Secondary | ICD-10-CM

## 2013-09-25 DIAGNOSIS — K5730 Diverticulosis of large intestine without perforation or abscess without bleeding: Secondary | ICD-10-CM

## 2013-09-25 HISTORY — PX: COLONOSCOPY: SHX5424

## 2013-09-25 SURGERY — COLONOSCOPY
Anesthesia: Moderate Sedation

## 2013-09-25 MED ORDER — MIDAZOLAM HCL 10 MG/2ML IJ SOLN
INTRAMUSCULAR | Status: AC
  Filled 2013-09-25: qty 2

## 2013-09-25 MED ORDER — MIDAZOLAM HCL 5 MG/5ML IJ SOLN
INTRAMUSCULAR | Status: DC | PRN
  Administered 2013-09-25 (×3): 2 mg via INTRAVENOUS

## 2013-09-25 MED ORDER — SODIUM CHLORIDE 0.9 % IV SOLN
INTRAVENOUS | Status: DC
  Administered 2013-09-25: 500 mL via INTRAVENOUS

## 2013-09-25 MED ORDER — FENTANYL CITRATE 0.05 MG/ML IJ SOLN
INTRAMUSCULAR | Status: DC | PRN
  Administered 2013-09-25 (×2): 25 ug via INTRAVENOUS

## 2013-09-25 MED ORDER — DIPHENHYDRAMINE HCL 50 MG/ML IJ SOLN
INTRAMUSCULAR | Status: AC
  Filled 2013-09-25: qty 1

## 2013-09-25 MED ORDER — FENTANYL CITRATE 0.05 MG/ML IJ SOLN
INTRAMUSCULAR | Status: AC
  Filled 2013-09-25: qty 2

## 2013-09-25 NOTE — Progress Notes (Signed)
Colonoscopy completed. See report No evidence for colitis.  Diverticulosis and healthy appearing sigmoid anastomosis. Healthy appearing colonic mucosa without evidence of recent bleeding Possible diverticular bleeding, now resolved. Okay for resumption of anti-coagulation per primary team or cardiology Okay for discharge from GI standpoint

## 2013-09-25 NOTE — Progress Notes (Signed)
For colonoscopy later this afternoon at approximately 1:30 pm.  Drank his entire prep last night and last stool this AM was yellow liquid with tiny amount of sediment according to nursing note.  Having colonoscopy for bloody diarrhea with previous colonoscopies suggestive of chronic colitis.

## 2013-09-25 NOTE — Discharge Summary (Addendum)
Physician Discharge Summary  Corey Tyler RUE:[id number] DOB: 03/11/1941 DOA: 09/22/2013  PCP: [name], MD  Admit date: 09/22/2013 Discharge date: 09/25/2013  Time spent: >30 minutes  Recommendations for Outpatient Follow-up:  Follow-up Information   Follow up with [name], MD. (in 1-2weeks, call for appt upon discharge)    Specialty:  Internal Medicine   Contact information:   [address] [place] [address] [phone]      CARDIOLOGIST/Dr Hochrein -AS directed, call for appt upon discharge  Discharge Diagnoses:  Principal Problem:   Acute lower GI bleeding Active Problems:   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   PULMONARY NODULE   Chronic systolic heart failure   Hyperlipidemia   Cardiomyopathy, nonischemic and ischemic   Diverticulosis of colon without hemorrhage   Discharge Condition: improved/stable  Diet recommendation:low sodium heart healthy  Filed Weights   09/22/13 1520 09/25/13 0505  Weight: 92.534 kg (204 lb) 92.625 kg (204 lb 3.2 oz)    History of present illness:  The pt is a 72 y.o. male, extensive PMH with prior h/o diverticulitis who presented to [facility] today 10/31 with ~10 multiple small episodes of GIB lower, mixed with stool. His wife reports that he actually had 6 episodes of dark stool before going to work at [employer] yesterday and then felt weak and had 2 more episodes of dark stool subsequently and then to max dose last night. He's not had any further dark stool since then. No recent Antibiotic exposure. Patient actually called his primary gastroenterologist and asked to see an M.D. and declined going to the office yesterday after the amount of stools and he had  Wife stated that after he work at [employer] and felt "weak"- per admitting MD he specifcally denied on close questioning any dizzyness, n, v or CP associated with this. No SOB or diaphoresis, no fever no chills no similar  illnesses in family, no exotic food or travel   Hospital Course/Possible Diverticular bleed:  Marland Kitchen Acute lower GI bleeding  -On admission pt's hgb was monitored and remained stable, GI was consulted and followed pt in the hospital  -on follow pt had bloody BM while in the hospital and GI scheduled colonscopy today 11/3  And it showed No evidence for colitis. Diverticulosis and healthy appearing sigmoid anastomosis. Healthy appearing colonic mucosa without evidence of recent bleeding Possible diverticular bleeding, now resolved. -GI recommended resuming anticoagulation, and pt ok for dc/ from GI standpoint -Pt is medically stable for d/c and has been instructed to resume xarelto and follow up with PCP. Marland Kitchen 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  -stable, continue bronchodilators . PULMONARY NODULE  -outpt follow up . Cardiomyopathy, nonischemic and ischemic/Chronic systolic heart failure  -stable, no evidence of fluid overload  -continue carvedilol and amio . Hyperlipidemia    Procedures:  Colonoscopy as above  Consultations:  GI  Discharge Exam: Filed Vitals:   09/25/13 1518  BP: 150/74  Pulse: 59  Temp: 97.7 F (36.5 C)  Resp: 19    Exam:  General: alert & oriented x 3 In NAD  Cardiovascular: RRR, nl S1 s2  Respiratory: CTAB  Abdomen: soft +BS NT/ND, no masses palpable  Extremities: No cyanosis and no edema   Discharge Instructions  Discharge Orders   Future Appointments Provider Department Dept Phone   10/02/2013 8:10 AM [facility] [place] Office [phone]   10/02/2013 10:00 AM Louis Meckel, MD  [facility] [phone]   Future Orders Complete By Expires   Diet - low sodium heart healthy  As directed    Increase activity slowly  As directed        Medication List         amiodarone 200 MG tablet  Commonly known as:  PACERONE  Take 1 tablet (200 mg total) by mouth  daily.     carvedilol 6.25 MG tablet  Commonly known as:  COREG  Take 1 tablet (6.25 mg total) by mouth 2 (two) times daily with a meal.     cholecalciferol 1000 UNITS tablet  Commonly known as:  VITAMIN D  Take 1,000 Units by mouth daily.     finasteride 5 MG tablet  Commonly known as:  PROSCAR  Take 5 mg by mouth every morning.     Fish Oil 1200 MG Caps  Take 1 capsule by mouth 2 (two) times daily.     FLOMAX 0.4 MG Caps capsule  Generic drug:  tamsulosin  Take 0.4 mg by mouth at bedtime.     fluticasone 50 MCG/ACT nasal spray  Commonly known as:  FLONASE  Place 1 spray into the nose daily as needed. For congestion     Fluticasone-Salmeterol 250-50 MCG/DOSE Aepb  Commonly known as:  ADVAIR  Inhale 1 puff into the lungs daily.     furosemide 40 MG tablet  Commonly known as:  LASIX  TAKE 1 TABLET BY MOUTH EVERY DAY     levothyroxine 25 MCG tablet  Commonly known as:  SYNTHROID, LEVOTHROID  Take 25 mcg by mouth daily before breakfast.     lisinopril 40 MG tablet  Commonly known as:  PRINIVIL,ZESTRIL  Take 40 mg by mouth daily.     loratadine 10 MG tablet  Commonly known as:  CLARITIN  Take 10 mg by mouth daily.     nitroGLYCERIN 0.4 MG SL tablet  Commonly known as:  NITROSTAT  Place 0.4 mg under the tongue every 5 (five) minutes as needed. For chest pain     pantoprazole 40 MG tablet  Commonly known as:  PROTONIX  Take 40 mg by mouth at bedtime.     psyllium 58.6 % powder  Commonly known as:  METAMUCIL  Take 1 packet by mouth daily.     Rivaroxaban 20 MG Tabs tablet  Commonly known as:  XARELTO  Take 20 mg by mouth daily with supper.     rosuvastatin 5 MG tablet  Commonly known as:  CRESTOR  Take 5 mg by mouth at bedtime.     vitamin B-12 1000 MCG tablet  Commonly known as:  CYANOCOBALAMIN  Take 1,000 mcg by mouth daily.       Allergies  Allergen Reactions  . Atorvastatin Other (See Comments)    cough       Follow-up Information   Follow  up with [name], MD. (in 1-2weeks, call for appt upon discharge)    Specialty:  Internal Medicine   Contact information:   [address] [place] [address] [phone]        The results of significant diagnostics from this hospitalization (including imaging, microbiology, ancillary and laboratory) are listed below for reference.    Significant Diagnostic Studies: No results found.  Microbiology: Recent Results (from the past 240 hour(s))  STOOL CULTURE     Status: None   Collection Time    09/23/13  5:57 PM      Result Value  Range Status   Specimen Description STOOL   Final   Special Requests NONE   Final   Culture     Final   Value: Culture reincubated for better growth     Performed at [facility]   Report Status PENDING   Incomplete     Labs: Basic Metabolic Panel:  Recent Labs Lab 09/22/13 1155 09/23/13 0359  NA 139 139  K 3.9 3.5  CL 104 107  CO2 27 25  GLUCOSE 105* 86  BUN 16 11  CREATININE 1.31 1.20  CALCIUM 9.0 8.8   Liver Function Tests:  Recent Labs Lab 09/22/13 1155 09/23/13 0359  AST 22 15  ALT 22 17  ALKPHOS 65 55  BILITOT 0.6 0.6  PROT 6.8 5.7*  ALBUMIN 3.8 3.1*   No results found for this basename: LIPASE, AMYLASE,  in the last 168 hours No results found for this basename: AMMONIA,  in the last 168 hours CBC:  Recent Labs Lab 09/22/13 1155 09/23/13 0359 09/24/13 0350  WBC 7.4 6.7 5.9  HGB 13.2 11.3* 11.2*  HCT 38.3* 33.0* 33.1*  MCV 93.2 93.0 93.8  PLT 178 147* 143*   Cardiac Enzymes: No results found for this basename: CKTOTAL, CKMB, CKMBINDEX, TROPONINI,  in the last 168 hours BNP: BNP (last 3 results)  Recent Labs  03/26/13 1139 03/27/13 0248  PROBNP 3488.0* 2908.0*   CBG: No results found for this basename: GLUCAP,  in the last 168 hours     Signed:  Breckon Reeves C  [facility] 09/25/2013, 5:58 PM

## 2013-09-25 NOTE — Progress Notes (Signed)
Pt finished entire container of golytely prior to midnight and last stool was yellow liquid with tiny amount of sediment

## 2013-09-25 NOTE — Op Note (Signed)
[facility] [address] [place], [address]   COLONOSCOPY PROCEDURE REPORT  PATIENT: Corey Tyler, Corey Tyler  MR#: [id number] BIRTHDATE: [birth year] , 72  yrs. old GENDER: Male ENDOSCOPIST: Beverley Fiedler, MD REFERRED AO:ZHYQM Hospitalist PROCEDURE DATE:  09/25/2013 PROCEDURE:   Colonoscopy, diagnostic First Screening Colonoscopy - Avg.  risk and is 50 yrs.  old or older - No.  Prior Negative Screening - Now for repeat screening. N/A  History of Adenoma - Now for follow-up colonoscopy & has been > or = to 3 yrs.  N/A  Polyps Removed Today? No.  Recommend repeat exam, <10 yrs? No. ASA CLASS:   Class III INDICATIONS:Rectal Bleeding, unexplained diarrhea, and history of colitis (nonspecific 2011). MEDICATIONS: These medications were titrated to patient response per physician's verbal order, Fentanyl 50 mcg IV, and Versed 6 mg IV  DESCRIPTION OF PROCEDURE:   After the risks benefits and alternatives of the procedure were thoroughly explained, informed consent was obtained.  A digital rectal exam revealed no rectal mass.   The Pentax Colonoscope [id number]  endoscope was introduced through the anus and advanced to the terminal ileum which was intubated for a short distance. No adverse events experienced. The quality of the prep was good, using MoviPrep  The instrument was then slowly withdrawn as the colon was fully examined.   COLON FINDINGS: The mucosa appeared normal in the terminal ileum. Moderate diverticulosis was noted beginning in the ascending colon and transverse colon (mild) and becoming more moderate in the descending colon, and sigmoid colon.   There was evidence of what is felt to be an end-to-end and healthy appearing prior surgical anastomosis in the sigmoid colon (25 cm from dentate).   The colonic mucosa appeared normal throughout the entire examined colon with no evidence for colitis, active or recent bleeding. Retroflexed views revealed no abnormalities.  The time to cecum=1 minutes 00 seconds.  Withdrawal time=11 minutes 00 seconds.  The scope was withdrawn and the procedure completed.  COMPLICATIONS: There were no complications.   ENDOSCOPIC IMPRESSION: 1.   Normal mucosa in the terminal ileum 2.   Moderate diverticulosis was noted in the ascending colon, transverse colon, descending colon, and sigmoid colon 3.   There was evidence of prior surgical anastomosis in the sigmoid colon; healthy appearing 4.   The colonic mucosa appeared normal throughout the entire examined colon  RECOMMENDATIONS: 1.  High fiber diet 2.  Continue current medications, okay to resume anticoagulation as directed by primary team and cardiology 3.  Call GI office for any further bleeding   eSigned:  Beverley Fiedler, MD 09/25/2013 2:36 PM   cc: The Patient   PATIENT NAME:  Corey Tyler, Corey Tyler MR#: [id number]

## 2013-09-25 NOTE — Progress Notes (Signed)
Patient seen, examined, and I agree with the above documentation, including the assessment and plan. The nature of the procedure, as well as the risks, benefits, and alternatives were carefully and thoroughly reviewed with the patient. Ample time for discussion and questions allowed. The patient understood, was satisfied, and agreed to proceed.

## 2013-09-26 ENCOUNTER — Encounter (HOSPITAL_COMMUNITY): Payer: Self-pay | Admitting: Internal Medicine

## 2013-09-28 ENCOUNTER — Telehealth: Payer: Self-pay | Admitting: Cardiology

## 2013-09-28 LAB — STOOL CULTURE

## 2013-09-28 NOTE — Telephone Encounter (Signed)
New message    Talk to a nurse about medications

## 2013-09-28 NOTE — Telephone Encounter (Signed)
Pt calls to update Dr. Antoine Poche that he started bleeding last Friday & was in the hospital from then to Sunday States he was not given any Xarelto while in the hospital.  It is on his discharge list of medications to take.  Pt would like Dr. Antoine Poche to know that he will not be taking Xarelto anymore. He will take a " low dose aspirin daily. "  I will forward this to Dr. Rennis Chris RN

## 2013-09-30 ENCOUNTER — Other Ambulatory Visit: Payer: Self-pay | Admitting: Cardiology

## 2013-10-01 NOTE — Telephone Encounter (Signed)
The patient has been informed previously of his risk of stroke with ASA alone.  He understands the risk.  GI said that he could resume his systemic anticoagulation.  I can only provide him with the previously recorded risk of stroke per his CHAD2SVasc score.  We also had the discussion that clinical trials demonstrate the inferiority of ASA vs systemic anticoagulation in stroke reduction.  He obviously accepts this risk in his choice to use 81 mg ASA only.

## 2013-10-02 ENCOUNTER — Ambulatory Visit: Payer: Medicare Other | Admitting: Gastroenterology

## 2013-10-02 ENCOUNTER — Ambulatory Visit (INDEPENDENT_AMBULATORY_CARE_PROVIDER_SITE_OTHER): Payer: Medicare Other | Admitting: *Deleted

## 2013-10-02 ENCOUNTER — Encounter: Payer: Self-pay | Admitting: Internal Medicine

## 2013-10-02 DIAGNOSIS — I5022 Chronic systolic (congestive) heart failure: Secondary | ICD-10-CM

## 2013-10-02 DIAGNOSIS — I428 Other cardiomyopathies: Secondary | ICD-10-CM

## 2013-10-02 DIAGNOSIS — Z95810 Presence of automatic (implantable) cardiac defibrillator: Secondary | ICD-10-CM

## 2013-10-05 LAB — MDC_IDC_ENUM_SESS_TYPE_REMOTE
Brady Statistic AP VP Percent: 1 %
Brady Statistic AP VS Percent: 51 %
Brady Statistic AS VP Percent: 1 %
Brady Statistic RA Percent Paced: 49 %
Brady Statistic RV Percent Paced: 1 %
Date Time Interrogation Session: 20141110113800
HighPow Impedance: 77 Ohm
HighPow Impedance: 77 Ohm
Implantable Pulse Generator Serial Number: 7003419
Lead Channel Pacing Threshold Amplitude: 1 V
Lead Channel Pacing Threshold Pulse Width: 0.6 ms
Lead Channel Sensing Intrinsic Amplitude: 12 mV
Lead Channel Setting Pacing Amplitude: 2.5 V
Zone Setting Detection Interval: 250 ms
Zone Setting Detection Interval: 300 ms

## 2013-11-01 ENCOUNTER — Encounter: Payer: Self-pay | Admitting: *Deleted

## 2013-11-04 ENCOUNTER — Encounter: Payer: Self-pay | Admitting: Internal Medicine

## 2013-11-08 ENCOUNTER — Ambulatory Visit (INDEPENDENT_AMBULATORY_CARE_PROVIDER_SITE_OTHER): Payer: Medicare Other | Admitting: Internal Medicine

## 2013-11-08 ENCOUNTER — Encounter: Payer: Self-pay | Admitting: Internal Medicine

## 2013-11-08 VITALS — BP 138/82 | HR 60 | Ht 73.0 in | Wt 214.4 lb

## 2013-11-08 DIAGNOSIS — I4891 Unspecified atrial fibrillation: Secondary | ICD-10-CM

## 2013-11-08 DIAGNOSIS — I10 Essential (primary) hypertension: Secondary | ICD-10-CM

## 2013-11-08 DIAGNOSIS — I5022 Chronic systolic (congestive) heart failure: Secondary | ICD-10-CM

## 2013-11-08 DIAGNOSIS — I472 Ventricular tachycardia: Secondary | ICD-10-CM

## 2013-11-08 DIAGNOSIS — Z95810 Presence of automatic (implantable) cardiac defibrillator: Secondary | ICD-10-CM

## 2013-11-08 DIAGNOSIS — I4949 Other premature depolarization: Secondary | ICD-10-CM

## 2013-11-08 DIAGNOSIS — I2589 Other forms of chronic ischemic heart disease: Secondary | ICD-10-CM

## 2013-11-08 DIAGNOSIS — I428 Other cardiomyopathies: Secondary | ICD-10-CM

## 2013-11-08 DIAGNOSIS — I255 Ischemic cardiomyopathy: Secondary | ICD-10-CM

## 2013-11-08 LAB — MDC_IDC_ENUM_SESS_TYPE_INCLINIC
Battery Remaining Longevity: 76.8 mo
Brady Statistic RV Percent Paced: 0.26 %
Date Time Interrogation Session: 20141217152656
HighPow Impedance: 73.125
Implantable Pulse Generator Serial Number: 7003419
Lead Channel Pacing Threshold Amplitude: 0.5 V
Lead Channel Sensing Intrinsic Amplitude: 12 mV
Lead Channel Sensing Intrinsic Amplitude: 3.4 mV
Lead Channel Setting Pacing Amplitude: 2.5 V
Lead Channel Setting Pacing Pulse Width: 0.6 ms
Lead Channel Setting Sensing Sensitivity: 0.5 mV

## 2013-11-08 MED ORDER — CARVEDILOL 12.5 MG PO TABS: 12.5000 mg | ORAL_TABLET | Freq: Two times a day (BID) | ORAL | Status: DC

## 2013-11-08 NOTE — Assessment & Plan Note (Signed)
The patient's device was interrogated.  The information was reviewed. No changes were made in the programming.    

## 2013-11-08 NOTE — Assessment & Plan Note (Signed)
A series of telephone notes in the chart expose the story by which the NOAC was discontinued. It sounds like apixaban was switched out for Rivaroxaban and then ultimately abandoned not withstanding the recommendations for NOAC therapy. He is on aspirin and we will continue amiodarone

## 2013-11-08 NOTE — Progress Notes (Signed)
Patient Care Team: [name], MD as PCP - General (Internal Medicine) Storm Frisk, MD (Pulmonary Disease)   HPI  Corey Tyler is a 72 y.o. male Seen in followup for ICD implantation 2/13 for primary prevention. This occurred in the context of ischemic/nonischemic cardiac myopathy underlying bradycardia. He also has a history of atrial fibrillation and treated hypothyroidism.  He comes in today because of an ICD discharge last week. It was associated with loss of consciousness. He had no significant change in exercise tolerance before or after.  Echo from 2008 demonstrating an EF of 40-45% with inferior/posterior hypokinesis. Cardiac catheterization from 2000 demonstrating normal coronary arteries but he did have a mildly reduced ejection fraction with wall motion abnormality. He's had stress perfusion studies at another practice. The last of these was in 2010. There was evidence for an EF of about 35%. He had increasing dyspnea and had an echo with on EF found to be 20 - 25%. Followup cath demonstrated only minimal coronary plaque. Echocardiogram 1/14 demonstrated progressive left ventricular dysfunction with an EF of 15% and wall motion abnormalities in the inferior wall  Past Medical History  Diagnosis Date  . Old myocardial infarction     a. ?Silent MI. b. Large fixed inferolateral defect c/w with prior infarct, no ischemia. c. Cath in 2000/2013 with only minimal CAD.  Marland Kitchen Hypertension   . COPD (chronic obstructive pulmonary disease)   . Hyperlipidemia   . Chronic systolic heart failure     EF down to 20 to 25% per echo November 2012  . NICM (nonischemic cardiomyopathy)     a. Normal cors 2000. b. Minimal plaque 2013.  . Pulmonary nodule, right     Last scan in 2010 showing stability; felt to be benign.  . Cardiac defibrillator -dual  [facility]   . Atrial tachycardia-non sustained     a. Noted on 03/2012 interrogation.  Marland Kitchen A-fib     a. Noted on 12/2012 interrogation,  placed on Apixaban.  Marland Kitchen PVC's (premature ventricular contractions)   . ICD (implantable cardiac defibrillator) in place   . Pacemaker   . Shortness of breath   . Arthritis   . CHF (congestive heart failure)     Past Surgical History  Procedure Laterality Date  . Colon surgery    . Cardiac catheterization  2000  . Icd  12/2011    Graciela Husbands  . Cardiac defibrillator placement    . Appendectomy    . Tee without cardioversion N/A 03/30/2013    Procedure: TRANSESOPHAGEAL ECHOCARDIOGRAM (TEE);  Surgeon: Vesta Mixer, MD;  Location: [facility] ENDOSCOPY;  Service: Cardiovascular;  Laterality: N/A;  . Cardioversion  03/30/2013    Procedure: CARDIOVERSION;  Surgeon: Vesta Mixer, MD;  Location: [facility] ENDOSCOPY;  Service: Cardiovascular;;  . Colonoscopy N/A 09/25/2013    Procedure: COLONOSCOPY;  Surgeon: Beverley Fiedler, MD;  Location: [facility] ENDOSCOPY;  Service: Endoscopy;  Laterality: N/A;    Current Outpatient Prescriptions  Medication Sig Dispense Refill  . amiodarone (PACERONE) 200 MG tablet Take 1 tablet (200 mg total) by mouth daily.  90 tablet  3  . aspirin 81 MG tablet Take 81 mg by mouth 2 (two) times daily.       . carvedilol (COREG) 12.5 MG tablet Take 1 tablet (12.5 mg total) by mouth 2 (two) times daily with a meal.  60 tablet  11  . cholecalciferol (VITAMIN D) 1000 UNITS tablet Take 1,000 Units by mouth daily.      Marland Kitchen  finasteride (PROSCAR) 5 MG tablet Take 5 mg by mouth every morning.      . fluticasone (FLONASE) 50 MCG/ACT nasal spray Place 1 spray into the nose daily as needed. For congestion      . Fluticasone-Salmeterol (ADVAIR) 250-50 MCG/DOSE AEPB Inhale 1 puff into the lungs daily.      . furosemide (LASIX) 40 MG tablet TAKE 1 TABLET BY MOUTH EVERY DAY  30 tablet  3  . levothyroxine (SYNTHROID, LEVOTHROID) 25 MCG tablet Take 25 mcg by mouth daily before breakfast. On weekends--1 and 1/2 on Saturday and Sunday per PCP      . lisinopril (PRINIVIL,ZESTRIL) 40 MG tablet Take 40 mg by mouth daily.        Marland Kitchen loratadine (CLARITIN) 10 MG tablet Take 10 mg by mouth daily.      . nitroGLYCERIN (NITROSTAT) 0.4 MG SL tablet Place 0.4 mg under the tongue every 5 (five) minutes as needed. For chest pain      . Omega-3 Fatty Acids (FISH OIL) 1200 MG CAPS Take 1 capsule by mouth 2 (two) times daily.        . pantoprazole (PROTONIX) 40 MG tablet Take 40 mg by mouth at bedtime.       . psyllium (METAMUCIL) 58.6 % powder Take 1 packet by mouth daily.       . rosuvastatin (CRESTOR) 5 MG tablet Take 5 mg by mouth at bedtime.       . Tamsulosin HCl (FLOMAX) 0.4 MG CAPS Take 0.4 mg by mouth at bedtime.       . vitamin B-12 (CYANOCOBALAMIN) 1000 MCG tablet Take 1,000 mcg by mouth daily.       No current facility-administered medications for this visit.    Allergies  Allergen Reactions  . Atorvastatin Other (See Comments)    cough    Review of Systems negative except from HPI and PMH  Physical Exam BP 138/82  Pulse 60  Ht 6\' 1"  (1.854 m)  Wt 214 lb 6.4 oz (97.251 kg)  BMI 28.29 kg/m2 Well developed and nourished in no acute distress HENT normal Neck supple with JVP-flat Clear Regular rate and rhythm, no murmurs or gallops Abd-soft with active BS No Clubbing cyanosis edema Skin-warm and dry A & Oriented  Grossly normal sensory and motor function     Assessment and  Plan

## 2013-11-08 NOTE — Assessment & Plan Note (Signed)
As above.

## 2013-11-08 NOTE — Assessment & Plan Note (Signed)
The patient had polymorphic ventricular tachycardia. The device tried to pace terminate x2 without success. A 40 J shock restored sinus rhythm. Was associated with syncope. We'll check potassium, magnesium, and undertake Myoview scanning. He is in on this perfusion defect.  He is advised that he is not to drive.  We will continue amiodarone. Will uptitrated carvedilol 12.5 mg twice daily. Amiodarone surveillance laboratories were normal with a TSH in July and LFTs in November

## 2013-11-08 NOTE — Patient Instructions (Addendum)
Your physician has recommended you make the following change in your medication:  1) Increase Carvedilol 12.5 mg twice daily  Your physician recommends that you return for lab work today: BMET/Magnesium  Your physician has requested that you have a lexiscan myoview. For further information please visit [email or web address] Please follow instruction sheet, as given.   **No Driving for 6 months**

## 2013-11-09 LAB — BASIC METABOLIC PANEL
BUN: 13 mg/dL (ref 6–23)
CO2: 31 mEq/L (ref 19–32)
Calcium: 8.9 mg/dL (ref 8.4–10.5)
GFR: 64.83 mL/min (ref >60.00)
Sodium: 139 mEq/L (ref 135–145)

## 2013-11-09 LAB — MAGNESIUM: Magnesium: 2.4 mg/dL (ref 1.5–2.5)

## 2013-11-18 ENCOUNTER — Ambulatory Visit: Payer: Medicare Other

## 2013-11-18 ENCOUNTER — Ambulatory Visit (INDEPENDENT_AMBULATORY_CARE_PROVIDER_SITE_OTHER): Payer: Medicare Other | Admitting: Internal Medicine

## 2013-11-18 VITALS — BP 160/82 | HR 85 | Temp 100.3°F | Resp 20 | Ht 71.75 in | Wt 207.0 lb

## 2013-11-18 DIAGNOSIS — I5022 Chronic systolic (congestive) heart failure: Secondary | ICD-10-CM

## 2013-11-18 DIAGNOSIS — R05 Cough: Secondary | ICD-10-CM

## 2013-11-18 DIAGNOSIS — I10 Essential (primary) hypertension: Secondary | ICD-10-CM

## 2013-11-18 DIAGNOSIS — R509 Fever, unspecified: Secondary | ICD-10-CM

## 2013-11-18 DIAGNOSIS — J439 Emphysema, unspecified: Secondary | ICD-10-CM

## 2013-11-18 DIAGNOSIS — J438 Other emphysema: Secondary | ICD-10-CM

## 2013-11-18 LAB — POCT CBC
HCT, POC: 45.3 % (ref 43.5–53.7)
Hemoglobin: 13.8 g/dL — AB (ref 14.1–18.1)
Lymph, poc: 1.1 (ref 0.6–3.4)
MCH, POC: 30.8 pg (ref 27–31.2)
MCHC: 30.5 g/dL — AB (ref 31.8–35.4)
MCV: 101.2 fL — AB (ref 80–97)
MID (cbc): 0.6 (ref 0–0.9)
POC MID %: 8.7 %M (ref 0–12)
Platelet Count, POC: 174 10*3/uL (ref 142–424)
WBC: 7.4 10*3/uL (ref 4.6–10.2)

## 2013-11-18 MED ORDER — LEVOFLOXACIN 750 MG PO TABS: 750.0000 mg | ORAL_TABLET | Freq: Every day | ORAL | Status: DC

## 2013-11-18 NOTE — Progress Notes (Addendum)
Subjective:  This chart was scribed for Ellamae Sia, MD by Carl Best, Medical Scribe. This patient was seen in Room 6 and the patient's care was started at 5:11 PM.   Patient ID: Corey Tyler, male    DOB: 1940/11/27, 72 y.o.   MRN: [id number]  HPI HPI Comments: Corey Tyler is a 72 y.o. male who presents to the [facility] complaining of constant cough, postnasal drip, and sinus congestion that started two days ago.  He states that his symptoms started with sinus congestion.  The patient lists low-grade fever and decreased energy as associated symptoms.  He denies sore throat as an associated symptom.  The patient's wife states that the patient had Chicken Noodle soup for lunch today and has not had anything to eat for breakfast or dinner.  The patient states that he received the flu shot this year.  Decreased Activity levels for 2 days. Decreased appetite today.   Pertinent active problems include: 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--on supplemental O2 but not with him today Chronic systolic heart failure HYPERTENSION  Current outpatient prescriptions:amiodarone (PACERONE) 200 MG tablet, Take 1 tablet (200 mg total) by mouth daily., Disp: 90 tablet, Rfl: 3;  aspirin 81 MG tablet, Take 81 mg by mouth 2 (two) times daily. , Disp: , Rfl: ;  carvedilol (COREG) 12.5 MG tablet, Take 1 tablet (12.5 mg total) by mouth 2 (two) times daily with a meal., Disp: 60 tablet, Rfl: 11;  cholecalciferol (VITAMIN D) 1000 UNITS tablet, Take 1,000 Units by mouth daily., Disp: , Rfl:  finasteride (PROSCAR) 5 MG tablet, Take 5 mg by mouth every morning., Disp: , Rfl: ;  fluticasone (FLONASE) 50 MCG/ACT nasal spray, Place 1 spray into the nose daily as needed. For congestion, Disp: , Rfl: ;  Fluticasone-Salmeterol (ADVAIR) 250-50 MCG/DOSE AEPB, Inhale 1 puff into the lungs daily., Disp: , Rfl: ;  furosemide (LASIX) 40 MG tablet, TAKE 1 TABLET BY MOUTH EVERY  DAY, Disp: 30 tablet, Rfl: 3 levothyroxine (SYNTHROID, LEVOTHROID) 25 MCG tablet, Take 25 mcg by mouth daily before breakfast. On weekends--1 and 1/2 on Saturday and Sunday per PCP, Disp: , Rfl: ;  lisinopril (PRINIVIL,ZESTRIL) 40 MG tablet, Take 40 mg by mouth daily. , Disp: , Rfl: ;  loratadine (CLARITIN) 10 MG tablet, Take 10 mg by mouth daily., Disp: , Rfl:  nitroGLYCERIN (NITROSTAT) 0.4 MG SL tablet, Place 0.4 mg under the tongue every 5 (five) minutes as needed. For chest pain, Disp: , Rfl: ;  Omega-3 Fatty Acids (FISH OIL) 1200 MG CAPS, Take 1 capsule by mouth 2 (two) times daily.  , Disp: , Rfl: ;  pantoprazole (PROTONIX) 40 MG tablet, Take 40 mg by mouth at bedtime. , Disp: , Rfl: ;  psyllium (METAMUCIL) 58.6 % powder, Take 1 packet by mouth daily. , Disp: , Rfl:  rosuvastatin (CRESTOR) 5 MG tablet, Take 5 mg by mouth at bedtime. , Disp: , Rfl: ;  Tamsulosin HCl (FLOMAX) 0.4 MG CAPS, Take 0.4 mg by mouth at bedtime. , Disp: , Rfl: ;  vitamin B-12 (CYANOCOBALAMIN) 1000 MCG tablet, Take 1,000 mcg by mouth daily., Disp: , Rfl: ;  levofloxacin (LEVAQUIN) 750 MG tablet, Take 1 tablet (750 mg total) by mouth daily., Disp: 10 tablet, Rfl: 0     Past Medical History  Diagnosis Date  . Old myocardial infarction     a. ?Silent MI. b. Large fixed inferolateral defect c/w with prior infarct, no  ischemia. c. Cath in 2000/2013 with only minimal CAD.  Marland Kitchen Hypertension   . COPD (chronic obstructive pulmonary disease)   . Hyperlipidemia   . Chronic systolic heart failure     EF down to 20 to 25% per echo November 2012  . NICM (nonischemic cardiomyopathy)     a. Normal cors 2000. b. Minimal plaque 2013.  . Pulmonary nodule, right     Last scan in 2010 showing stability; felt to be benign.  . Cardiac defibrillator -dual  [employer]   . Atrial tachycardia-non sustained     a. Noted on 03/2012 interrogation.  Marland Kitchen A-fib     a. Noted on 12/2012 interrogation, placed on Apixaban and ultimately stopped NOACs 2/2  personal decision 8/14.  Marland Kitchen PVC's (premature ventricular contractions)   . ICD (implantable cardiac defibrillator) in place   . Ventricular tachycardia, polymorphic     Rx via ICD 12/14  . Arthritis    Past Surgical History  Procedure Laterality Date  . Colon surgery    . Cardiac catheterization  2000  . Icd  12/2011    Graciela Husbands  . Cardiac defibrillator placement    . Appendectomy    . Tee without cardioversion N/A 03/30/2013    Procedure: TRANSESOPHAGEAL ECHOCARDIOGRAM (TEE);  Surgeon: Vesta Mixer, MD;  Location: [facility] ENDOSCOPY;  Service: Cardiovascular;  Laterality: N/A;  . Cardioversion  03/30/2013    Procedure: CARDIOVERSION;  Surgeon: Vesta Mixer, MD;  Location: [facility] ENDOSCOPY;  Service: Cardiovascular;;  . Colonoscopy N/A 09/25/2013    Procedure: COLONOSCOPY;  Surgeon: Beverley Fiedler, MD;  Location: [facility] ENDOSCOPY;  Service: Endoscopy;  Laterality: N/A;       Review of Systems  Constitutional: Negative for unexpected weight change.  HENT: Negative for mouth sores and sore throat.   Eyes: Negative for redness and visual disturbance.  Cardiovascular: Negative for chest pain, palpitations and leg swelling.  Gastrointestinal: Negative for abdominal pain.  Genitourinary: Negative for difficulty urinating.  Musculoskeletal: Negative for joint swelling.  Skin: Negative for rash.      Objective:  Physical Exam  Nursing note and vitals reviewed. Constitutional: He is oriented to person, place, and time. He appears well-developed and well-nourished. He appears distressed.  Mildly uncomfortable but not tachypneic  HENT:  Head: Normocephalic and atraumatic.  Right Ear: External ear normal.  Left Ear: External ear normal.  Mouth/Throat: Oropharynx is clear and moist.  Congested nares with purulent discharge  Eyes: Conjunctivae and EOM are normal. Pupils are equal, round, and reactive to light.  Neck: Neck supple.  Cardiovascular: Normal rate, regular rhythm, normal heart sounds and  intact distal pulses.   No murmur heard. Pulmonary/Chest: Effort normal. No respiratory distress.  Lungs with diminished breath sounds at the left base and fine crackles at both bases No wheezing with forced expiration but has prolonged expiratory phase  Abdominal: Soft. Bowel sounds are normal.  Musculoskeletal: He exhibits no edema.  Lymphadenopathy:    He has no cervical adenopathy.  Neurological: He is alert and oriented to person, place, and time. No cranial nerve deficit.  Psychiatric: He has a normal mood and affect. His behavior is normal.    [facility] reading (PRIMARY) by  Dr.Aedan Geimer=continues with LLL opacification at diaphragm Results for orders placed in visit on 11/18/13  POCT CBC      Result Value Range   WBC 7.4  4.6 - 10.2 K/uL   Lymph, poc 1.1  0.6 - 3.4   POC LYMPH PERCENT 14.7  10 -  50 %L   MID (cbc) 0.6  0 - 0.9   POC MID % 8.7  0 - 12 %M   POC Granulocyte 5.7  2 - 6.9   Granulocyte percent 76.6  37 - 80 %G   RBC 4.48 (*) 4.69 - 6.13 M/uL   Hemoglobin 13.8 (*) 14.1 - 18.1 g/dL   HCT, POC 13.2  44.0 - 53.7 %   MCV 101.2 (*) 80 - 97 fL   MCH, POC 30.8  27 - 31.2 pg   MCHC 30.5 (*) 31.8 - 35.4 g/dL   RDW, POC 10.2     Platelet Count, POC 174  142 - 424 K/uL   MPV 9.1  0 - 99.8 fL       BP 160/82  Pulse 85  Temp(Src) 100.3 F (37.9 C) (Oral)  Resp 20  Ht 5' 11.75" (1.822 m)  Wt 207 lb (93.895 kg)  BMI 28.28 kg/m2  SpO2 86%  Assessment & Plan:  I have completed the patient encounter in its entirety as documented by the scribe, with editing by me where necessary. Sire Poet P. Merla Riches, M.D. 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

## 2013-11-30 ENCOUNTER — Ambulatory Visit (HOSPITAL_COMMUNITY): Payer: Managed Care, Other (non HMO) | Attending: Internal Medicine | Admitting: Radiology

## 2013-11-30 ENCOUNTER — Encounter: Payer: Self-pay | Admitting: Cardiology

## 2013-11-30 VITALS — BP 148/80 | HR 65 | Ht 73.0 in | Wt 210.0 lb

## 2013-11-30 DIAGNOSIS — I10 Essential (primary) hypertension: Secondary | ICD-10-CM | POA: Insufficient documentation

## 2013-11-30 DIAGNOSIS — I2510 Atherosclerotic heart disease of native coronary artery without angina pectoris: Secondary | ICD-10-CM | POA: Insufficient documentation

## 2013-11-30 DIAGNOSIS — R0602 Shortness of breath: Secondary | ICD-10-CM | POA: Insufficient documentation

## 2013-11-30 DIAGNOSIS — R972 Elevated prostate specific antigen [PSA]: Secondary | ICD-10-CM | POA: Diagnosis not present

## 2013-11-30 DIAGNOSIS — J449 Chronic obstructive pulmonary disease, unspecified: Secondary | ICD-10-CM | POA: Insufficient documentation

## 2013-11-30 DIAGNOSIS — I252 Old myocardial infarction: Secondary | ICD-10-CM | POA: Insufficient documentation

## 2013-11-30 DIAGNOSIS — R55 Syncope and collapse: Secondary | ICD-10-CM | POA: Diagnosis not present

## 2013-11-30 DIAGNOSIS — I255 Ischemic cardiomyopathy: Secondary | ICD-10-CM

## 2013-11-30 DIAGNOSIS — J4489 Other specified chronic obstructive pulmonary disease: Secondary | ICD-10-CM | POA: Insufficient documentation

## 2013-11-30 DIAGNOSIS — I2589 Other forms of chronic ischemic heart disease: Secondary | ICD-10-CM | POA: Insufficient documentation

## 2013-11-30 DIAGNOSIS — I4891 Unspecified atrial fibrillation: Secondary | ICD-10-CM | POA: Insufficient documentation

## 2013-11-30 MED ORDER — TECHNETIUM TC 99M SESTAMIBI GENERIC - CARDIOLITE
33.0000 | Freq: Once | INTRAVENOUS | Status: AC | PRN
  Administered 2013-11-30: 33 via INTRAVENOUS

## 2013-11-30 MED ORDER — REGADENOSON 0.4 MG/5ML IV SOLN
0.4000 mg | Freq: Once | INTRAVENOUS | Status: AC
  Administered 2013-11-30: 0.4 mg via INTRAVENOUS

## 2013-11-30 MED ORDER — TECHNETIUM TC 99M SESTAMIBI GENERIC - CARDIOLITE
10.8000 | Freq: Once | INTRAVENOUS | Status: AC | PRN
  Administered 2013-11-30: 11 via INTRAVENOUS

## 2013-11-30 NOTE — Progress Notes (Signed)
[facility] [address] [place], [place] [address] [phone]    Cardiology Nuclear Med Study  Corey Tyler is a 73 y.o. male     MRN : [id number]     DOB: [birth year]  Procedure Date: 11/30/2013  Nuclear Med Background Indication for Stress Test:  Evaluation for Ischemia History:  CAD, MI, Cath (normal), AICD, Afib, Echo 2014 EF 25-30%, MPI 2012 (scar), COPD Cardiac Risk Factors: Family History - CAD, History of Smoking, Hypertension and Lipids  Symptoms:  Syncope   Nuclear Pre-Procedure Caffeine/Decaff Intake:  None NPO After: 10:00pm   Lungs:  clear O2 Sat: 93% on room air. IV 0.9% NS with Angio Cath:  22g  IV Site: R Antecubital  IV Started by:  Crissie Figures, RN  Chest Size (in):  46 Cup Size: n/a  Height: 6\' 1"  (1.854 m)  Weight:  210 lb (95.255 kg)  BMI:  Body mass index is 27.71 kg/(m^2). Tech Comments:  N/A    Nuclear Med Study 1 or 2 day study: 1 day  Stress Test Type:  Treadmill/Lexiscan  Reading MD: N/A  Order Authorizing Provider:  Jolyn Nap, MD  Resting Radionuclide: Technetium 8m Sestamibi  Resting Radionuclide Dose: 11.0 mCi   Stress Radionuclide:  Technetium 12m Sestamibi  Stress Radionuclide Dose: 33.0 mCi           Stress Protocol Rest HR: 65 Stress HR: 99  Rest BP: 148/80 Stress BP: 116/71  Exercise Time (min): n/a METS: n/a           Dose of Adenosine (mg):  n/a Dose of Lexiscan: 0.4 mg  Dose of Atropine (mg): n/a Dose of Dobutamine: n/a mcg/kg/min (at max HR)  Stress Test Technologist: Glade Lloyd, BS-ES  Nuclear Technologist:  Charlton Amor, CNMT     Rest Procedure:  Myocardial perfusion imaging was performed at rest 45 minutes following the intravenous administration of Technetium 59m Sestamibi. Rest ECG: SR 65 bpm  Frequent PVC.    Stress Procedure:  The patient received IV Lexiscan 0.4 mg over 15-seconds with concurrent low level exercise and then Technetium 83m Sestamibi was injected at  30-seconds while the patient continued walking one more minute.  Quantitative spect images were obtained after a 45-minute delay.  During the infusion of Lexiscan, the patient complained of SOB.  SOB resolved in recovery.  Stress ECG:no significant change from baseline.    QPS Raw Data Images: Soft tissue (diaphragm, bowel activity) underlie heart.   Stress Images:  Large defect in the inferior wall (base, mid) inferolateral wall (base, mid), anterolateral wall (base, mid).  Otherwise normal perfusion.   Rest Images: Minimal improvement in the mid inferior/inferolateral wall Subtraction (SDS):  No significant ischemia.   Transient Ischemic Dilatation (Normal <1.22):  0.86 Lung/Heart Ratio (Normal <0.45):  0.37  Quantitative Gated Spect Images QGS EDV:  n/a QGS ESV:  n/a  Impression Exercise Capacity:  Lexiscan with low level exercise. BP Response:  Normal blood pressure response. Clinical Symptoms:  No chest pain. ECG Impression:  No significant ST segment change suggestive of ischemia. Comparison with Prior Nuclear Study: No significant change from previous scan of 2012.  Overall Impression:  Large scar in the inferior, inferolateral and anterolateral distribution; cannot exclude some soft tissue attenuation.  No evidence for significant ischemia.  No significant change from [place] of 2012.   LV Ejection Fraction: Study not gated.  LV Wall Motion:  Study not gated  [employer]

## 2013-12-08 ENCOUNTER — Telehealth: Payer: Self-pay | Admitting: Cardiology

## 2013-12-08 NOTE — Telephone Encounter (Signed)
New message   patient calling stated he wants to speak with nurse to discuss blood thinner medication. Ask patient if he was out of medication, patients stated no.

## 2013-12-08 NOTE — Telephone Encounter (Signed)
Left message to call back to discuss concerns. 

## 2013-12-11 NOTE — Telephone Encounter (Signed)
Follow up ° ° ° ° °Returning a nurses call °

## 2013-12-11 NOTE — Telephone Encounter (Signed)
Pt restarted Eliquis 5 mg twice a day.  Wants to know if he can decrease the dose to 2.5 mg twice a day because he has noticed since restarting that his "lungs hurt".  He denies any increase in SOB but states he had noticed a pressure on his lungs since he restarted Eliquis.  Aware I will forward to MD for review.

## 2013-12-12 DIAGNOSIS — R3129 Other microscopic hematuria: Secondary | ICD-10-CM | POA: Diagnosis not present

## 2013-12-12 DIAGNOSIS — N400 Enlarged prostate without lower urinary tract symptoms: Secondary | ICD-10-CM | POA: Diagnosis not present

## 2013-12-12 DIAGNOSIS — R972 Elevated prostate specific antigen [PSA]: Secondary | ICD-10-CM | POA: Diagnosis not present

## 2013-12-12 NOTE — Telephone Encounter (Signed)
Pt advised,verbalized understanding. 

## 2013-12-12 NOTE — Telephone Encounter (Signed)
2.5 would not be the correct dose.  Needs to see [name], MD to evaluate lungs hurting.

## 2013-12-22 ENCOUNTER — Emergency Department (HOSPITAL_COMMUNITY)
Admission: EM | Admit: 2013-12-22 | Discharge: 2013-12-22 | Disposition: A | Discharge status: Home / Self Care | Payer: Managed Care, Other (non HMO) | Attending: Emergency Medicine | Admitting: Emergency Medicine

## 2013-12-22 ENCOUNTER — Emergency Department (HOSPITAL_COMMUNITY): Payer: Managed Care, Other (non HMO)

## 2013-12-22 ENCOUNTER — Other Ambulatory Visit: Payer: Self-pay

## 2013-12-22 ENCOUNTER — Encounter (HOSPITAL_COMMUNITY): Payer: Self-pay | Admitting: Emergency Medicine

## 2013-12-22 DIAGNOSIS — R000 Tachycardia, unspecified: Secondary | ICD-10-CM | POA: Insufficient documentation

## 2013-12-22 DIAGNOSIS — I252 Old myocardial infarction: Secondary | ICD-10-CM | POA: Insufficient documentation

## 2013-12-22 DIAGNOSIS — I4891 Unspecified atrial fibrillation: Secondary | ICD-10-CM | POA: Diagnosis not present

## 2013-12-22 DIAGNOSIS — J449 Chronic obstructive pulmonary disease, unspecified: Secondary | ICD-10-CM | POA: Insufficient documentation

## 2013-12-22 DIAGNOSIS — Z87891 Personal history of nicotine dependence: Secondary | ICD-10-CM | POA: Diagnosis not present

## 2013-12-22 DIAGNOSIS — M129 Arthropathy, unspecified: Secondary | ICD-10-CM | POA: Insufficient documentation

## 2013-12-22 DIAGNOSIS — I10 Essential (primary) hypertension: Secondary | ICD-10-CM | POA: Diagnosis not present

## 2013-12-22 DIAGNOSIS — Z7982 Long term (current) use of aspirin: Secondary | ICD-10-CM | POA: Diagnosis not present

## 2013-12-22 DIAGNOSIS — Z9889 Other specified postprocedural states: Secondary | ICD-10-CM | POA: Insufficient documentation

## 2013-12-22 DIAGNOSIS — Z79899 Other long term (current) drug therapy: Secondary | ICD-10-CM | POA: Insufficient documentation

## 2013-12-22 DIAGNOSIS — E785 Hyperlipidemia, unspecified: Secondary | ICD-10-CM | POA: Insufficient documentation

## 2013-12-22 DIAGNOSIS — IMO0002 Reserved for concepts with insufficient information to code with codable children: Secondary | ICD-10-CM | POA: Insufficient documentation

## 2013-12-22 DIAGNOSIS — Z7902 Long term (current) use of antithrombotics/antiplatelets: Secondary | ICD-10-CM | POA: Diagnosis not present

## 2013-12-22 DIAGNOSIS — Z95810 Presence of automatic (implantable) cardiac defibrillator: Secondary | ICD-10-CM | POA: Diagnosis not present

## 2013-12-22 DIAGNOSIS — I5022 Chronic systolic (congestive) heart failure: Secondary | ICD-10-CM | POA: Diagnosis not present

## 2013-12-22 DIAGNOSIS — J4489 Other specified chronic obstructive pulmonary disease: Secondary | ICD-10-CM | POA: Insufficient documentation

## 2013-12-22 LAB — COMPREHENSIVE METABOLIC PANEL
ALK PHOS: 70 U/L (ref 39–117)
ALT: 23 U/L (ref 0–53)
AST: 20 U/L (ref 0–37)
Albumin: 3.6 g/dL (ref 3.5–5.2)
BILIRUBIN TOTAL: 0.4 mg/dL (ref 0.3–1.2)
BUN: 23 mg/dL (ref 6–23)
CHLORIDE: 103 meq/L (ref 96–112)
CO2: 25 mEq/L (ref 19–32)
Calcium: 9.2 mg/dL (ref 8.4–10.5)
Creatinine, Ser: 1.58 mg/dL — ABNORMAL HIGH (ref 0.50–1.35)
GFR calc Af Amer: 48 mL/min — ABNORMAL LOW (ref >90)
GFR calc non Af Amer: 42 mL/min — ABNORMAL LOW (ref >90)
GLUCOSE: 143 mg/dL — AB (ref 70–99)
POTASSIUM: 4.1 meq/L (ref 3.7–5.3)
SODIUM: 142 meq/L (ref 137–147)
TOTAL PROTEIN: 7.1 g/dL (ref 6.0–8.3)

## 2013-12-22 LAB — CBC WITH DIFFERENTIAL/PLATELET
Basophils Absolute: 0 10*3/uL (ref 0.0–0.1)
Basophils Relative: 0 % (ref 0–1)
EOS ABS: 0.3 10*3/uL (ref 0.0–0.7)
Eosinophils Relative: 3 % (ref 0–5)
HCT: 41.3 % (ref 39.0–52.0)
Hemoglobin: 14.2 g/dL (ref 13.0–17.0)
LYMPHS ABS: 2.3 10*3/uL (ref 0.7–4.0)
Lymphocytes Relative: 25 % (ref 12–46)
MCH: 31.8 pg (ref 26.0–34.0)
MCHC: 34.4 g/dL (ref 30.0–36.0)
MCV: 92.4 fL (ref 78.0–100.0)
Monocytes Absolute: 0.9 10*3/uL (ref 0.1–1.0)
Monocytes Relative: 10 % (ref 3–12)
NEUTROS ABS: 5.5 10*3/uL (ref 1.7–7.7)
NEUTROS PCT: 61 % (ref 43–77)
PLATELETS: 211 10*3/uL (ref 150–400)
RBC: 4.47 MIL/uL (ref 4.22–5.81)
RDW: 14 % (ref 11.5–15.5)
WBC: 9 10*3/uL (ref 4.0–10.5)

## 2013-12-22 LAB — TROPONIN I: Troponin I: <0.3 ng/mL (ref <0.30)

## 2013-12-22 NOTE — ED Notes (Signed)
Patient transported to X-ray 

## 2013-12-22 NOTE — ED Provider Notes (Signed)
CSN: [id number]     Arrival date & time 12/22/13  1833 History   First MD Initiated Contact with Patient 12/22/13 1914     Chief Complaint  Patient presents with  . Near Syncope   (Consider location/radiation/quality/duration/timing/severity/associated sxs/prior Treatment) Patient is a 73 y.o. male presenting with near-syncope. The history is provided by the patient. No language interpreter was used.  Near Syncope This is a new problem. The current episode started today. The problem occurs constantly. The problem has been unchanged. Pertinent negatives include no abdominal pain, chest pain, coughing, diaphoresis, fever, headaches or nausea. Nothing aggravates the symptoms. He has tried nothing for the symptoms. The treatment provided no relief.  Pt reports he works at [employer].   Pt walked quickly from the back of store to the front.  Pt reports his heart began racing and would not stop.  Pt reports he does not think his pacemaker is working.   Pt reports his defibrillator fired last month when he had a similar episode.    Past Medical History  Diagnosis Date  . Old myocardial infarction     a. ?Silent MI. b. Large fixed inferolateral defect c/w with prior infarct, no ischemia. c. Cath in 2000/2013 with only minimal CAD.  Marland Kitchen Hypertension   . COPD (chronic obstructive pulmonary disease)   . Hyperlipidemia   . Chronic systolic heart failure     EF down to 20 to 25% per echo November 2012  . NICM (nonischemic cardiomyopathy)     a. Normal cors 2000. b. Minimal plaque 2013.  . Pulmonary nodule, right     Last scan in 2010 showing stability; felt to be benign.  . Cardiac defibrillator -dual  [employer]   . Atrial tachycardia-non sustained     a. Noted on 03/2012 interrogation.  Marland Kitchen A-fib     a. Noted on 12/2012 interrogation, placed on Apixaban and ultimately stopped NOACs 2/2 personal decision 8/14.  Marland Kitchen PVC's (premature ventricular contractions)   . ICD (implantable cardiac defibrillator) in  place   . Ventricular tachycardia, polymorphic     Rx via ICD 12/14  . Arthritis    Past Surgical History  Procedure Laterality Date  . Colon surgery    . Cardiac catheterization  2000  . Icd  12/2011    [name]  . Cardiac defibrillator placement    . Appendectomy    . Tee without cardioversion N/A 03/30/2013    Procedure: TRANSESOPHAGEAL ECHOCARDIOGRAM (TEE);  Surgeon: Thayer Headings, MD;  Location: [facility];  Service: Cardiovascular;  Laterality: N/A;  . Cardioversion  03/30/2013    Procedure: CARDIOVERSION;  Surgeon: Thayer Headings, MD;  Location: [facility];  Service: Cardiovascular;;  . Colonoscopy N/A 09/25/2013    Procedure: COLONOSCOPY;  Surgeon: Jerene Bears, MD;  Location: [facility] ENDOSCOPY;  Service: Endoscopy;  Laterality: N/A;   Family History  Problem Relation Age of Onset  . Emphysema Mother   . Heart disease Mother     AVR  . Heart failure Father     Died agwe 70   History  Substance Use Topics  . Smoking status: Former Smoker -- 2.50 packs/day for 40 years    Types: Cigarettes    Quit date: 11/24/1983  . Smokeless tobacco: Never Used     Comment: 2 1/2 ppd x 40 years  . Alcohol Use: No    Review of Systems  Constitutional: Negative for fever and diaphoresis.  Respiratory: Negative for cough.   Cardiovascular: Positive for  near-syncope. Negative for chest pain.  Gastrointestinal: Negative for nausea and abdominal pain.  Neurological: Negative for headaches.  All other systems reviewed and are negative.    Allergies  Xarelto; Adhesive; and Atorvastatin  Home Medications   Current Outpatient Rx  Name  Route  Sig  Dispense  Refill  . amiodarone (PACERONE) 200 MG tablet   Oral   Take 1 tablet (200 mg total) by mouth daily.   90 tablet   3   . apixaban (ELIQUIS) 5 MG TABS tablet   Oral   Take 5 mg by mouth 2 (two) times daily.         . carvedilol (COREG) 12.5 MG tablet   Oral   Take 1 tablet (12.5 mg total) by mouth 2 (two) times daily with  a meal.   60 tablet   11   . cholecalciferol (VITAMIN D) 1000 UNITS tablet   Oral   Take 1,000 Units by mouth daily.         Marland Kitchen doxycycline (VIBRAMYCIN) 100 MG capsule   Oral   Take 100 mg by mouth 2 (two) times daily.         . finasteride (PROSCAR) 5 MG tablet   Oral   Take 5 mg by mouth every morning.         . Fluticasone-Salmeterol (ADVAIR) 250-50 MCG/DOSE AEPB   Inhalation   Inhale 1 puff into the lungs daily.         . furosemide (LASIX) 40 MG tablet      TAKE 1 TABLET BY MOUTH EVERY DAY   30 tablet   3   . levothyroxine (SYNTHROID, LEVOTHROID) 25 MCG tablet   Oral   Take 25 mcg by mouth daily before breakfast. On weekends--1 and 1/2 on Saturday and Sunday per PCP         . lisinopril (PRINIVIL,ZESTRIL) 40 MG tablet   Oral   Take 40 mg by mouth daily.          . Omega-3 Fatty Acids (FISH OIL) 1200 MG CAPS   Oral   Take 1 capsule by mouth 2 (two) times daily.           . pantoprazole (PROTONIX) 40 MG tablet   Oral   Take 40 mg by mouth at bedtime.          . psyllium (METAMUCIL) 58.6 % powder   Oral   Take 1 packet by mouth daily.          . rosuvastatin (CRESTOR) 5 MG tablet   Oral   Take 5 mg by mouth at bedtime.          . Tamsulosin HCl (FLOMAX) 0.4 MG CAPS   Oral   Take 0.4 mg by mouth at bedtime.          . vitamin B-12 (CYANOCOBALAMIN) 1000 MCG tablet   Oral   Take 1,000 mcg by mouth daily.         Marland Kitchen aspirin 81 MG tablet   Oral   Take 81 mg by mouth 2 (two) times daily.          . fluticasone (FLONASE) 50 MCG/ACT nasal spray   Nasal   Place 1 spray into the nose daily as needed. For congestion         . loratadine (CLARITIN) 10 MG tablet   Oral   Take 10 mg by mouth daily.         . nitroGLYCERIN (NITROSTAT) 0.4  MG SL tablet   Sublingual   Place 0.4 mg under the tongue every 5 (five) minutes as needed. For chest pain          BP 122/63  Pulse 76  Temp(Src) 97.6 F (36.4 C) (Oral)  Resp 16  Wt  212 lb 9 oz (96.418 kg)  SpO2 92% Physical Exam  Nursing note and vitals reviewed. Constitutional: He is oriented to person, place, and time. He appears well-developed.  HENT:  Head: Normocephalic and atraumatic.  Right Ear: External ear normal.  Eyes: Conjunctivae are normal.  Neck: Normal range of motion. Neck supple.  Cardiovascular: Normal rate and normal heart sounds.   Pulmonary/Chest: Effort normal and breath sounds normal.  Abdominal: Soft.  Musculoskeletal: Normal range of motion.  Neurological: He is alert and oriented to person, place, and time. He has normal reflexes.  Skin: Skin is warm.  Psychiatric: He has a normal mood and affect.    ED Course  Procedures (including critical care time) Labs Review Labs Reviewed  COMPREHENSIVE METABOLIC PANEL - Abnormal; Notable for the following:    Glucose, Bld 143 (*)    Creatinine, Ser 1.58 (*)    GFR calc non Af Amer 42 (*)    GFR calc Af Amer 48 (*)    All other components within normal limits  CBC WITH DIFFERENTIAL  TROPONIN I   Imaging Review Dg Chest 2 View  12/22/2013   CLINICAL DATA:  NEAR SYNCOPE  EXAM: CHEST  2 VIEW  COMPARISON:  DG CHEST 2V dated 11/18/2013  FINDINGS: Left-sided pacemaker with continuous leads overlies normal cardiac silhouette. There is mild scarring at the left lung base. Lungs are hyperinflated. There is central bronchitic markings. No acute infiltrate or pulmonary edema. No pleural fluid. Degenerative osteophytosis of the thoracic spine.  IMPRESSION: 1.  No acute cardiopulmonary process. 2. No change from prior. 3. Emphysematous change   Electronically Signed   By: Suzy Bouchard M.D.   On: 12/22/2013 19:39    EKG Interpretation   None     labs normal,  EKG reviewed,  No acute abnormality.    MDM [name] Tech here to Immunologist.   She reports everything is working normally.  No firing of defib or pacemaker.  Heart rate not above set limits (see report)    1. Tachycardia     Pt advised rest,   Follow up with your MD next week.      [place], PA-C 12/22/13 2139

## 2013-12-22 NOTE — ED Notes (Signed)
Attending spoke to [facility] concerning patient implantable device

## 2013-12-22 NOTE — ED Notes (Addendum)
Pt denies any pain  but complain of generalized weakness. Stated heart speed up at night, stated he dose not sleep well at night.

## 2013-12-22 NOTE — ED Notes (Signed)
The pt is c/o near syncope when he was walking fast at work just pta.  His heart was beating fast and he has a hx of  Af.  No pain he has a aicd but he does not think it is working.  Some sob also

## 2013-12-22 NOTE — ED Notes (Signed)
Pt return to room

## 2013-12-22 NOTE — Discharge Instructions (Signed)
Nonspecific Tachycardia Tachycardia is a faster than normal heartbeat (more than 100 beats per minute). In adults, the heart normally beats between 60 and 100 times a minute. A fast heartbeat may be a normal response to exercise or stress. It does not necessarily mean that something is wrong. However, sometimes when your heart beats too fast it may not be able to pump enough blood to the rest of your body. This can result in chest pain, shortness of breath, dizziness, and even fainting. Nonspecific tachycardia means that the specific cause or pattern of your tachycardia is unknown. CAUSES  Tachycardia may be harmless or it may be due to a more serious underlying cause. Possible causes of tachycardia include:  Exercise or exertion.  Fever.  Pain or injury.  Infection.  Loss of body fluids (dehydration).  Overactive thyroid.  Lack of red blood cells (anemia).  Anxiety and stress.  Alcohol.  Caffeine.  Tobacco products.  Diet pills.  Illegal drugs.  Heart disease. SYMPTOMS  Rapid or irregular heartbeat (palpitations).  Suddenly feeling your heart beating (cardiac awareness).  Dizziness.  Tiredness (fatigue).  Shortness of breath.  Chest pain.  Nausea.  Fainting. DIAGNOSIS  Your caregiver will perform a physical exam and take your medical history. In some cases, a heart specialist (cardiologist) may be consulted. Your caregiver may also order:  Blood tests.  Electrocardiography. This test records the electrical activity of your heart.  A heart monitoring test. TREATMENT  Treatment will depend on the likely cause of your tachycardia. The goal is to treat the underlying cause of your tachycardia. Treatment methods may include:  Replacement of fluids or blood through an intravenous (IV) tube for moderate to severe dehydration or anemia.  New medicines or changes in your current medicines.  Diet and lifestyle changes.  Treatment for certain  infections.  Stress relief or relaxation methods. HOME CARE INSTRUCTIONS   Rest.  Drink enough fluids to keep your urine clear or pale yellow.  Do not smoke.  Avoid:  Caffeine.  Tobacco.  Alcohol.  Chocolate.  Stimulants such as over-the-counter diet pills or pills that help you stay awake.  Situations that cause anxiety or stress.  Illegal drugs such as marijuana, phencyclidine (PCP), and cocaine.  Only take medicine as directed by your caregiver.  Keep all follow-up appointments as directed by your caregiver. SEEK IMMEDIATE MEDICAL CARE IF:   You have pain in your chest, upper arms, jaw, or neck.  You become weak, dizzy, or feel faint.  You have palpitations that will not go away.  You vomit, have diarrhea, or pass blood in your stool.  Your skin is cool, pale, and wet.  You have a fever that will not go away with rest, fluids, and medicine. MAKE SURE YOU:   Understand these instructions.  Will watch your condition.  Will get help right away if you are not doing well or get worse. Document Released: 12/17/2004 Document Revised: 02/01/2012 Document Reviewed: 10/20/2011 Exit[employer] Patient Information 2014 [employer].  

## 2013-12-24 NOTE — ED Provider Notes (Signed)
Medical screening examination/treatment/procedure(s) were performed by non-physician practitioner and as supervising physician I was immediately available for consultation/collaboration.  EKG Interpretation    Date/Time:  Friday December 22 2013 18:37:31 EST Ventricular Rate:  94 PR Interval:  180 QRS Duration: 100 QT Interval:  392 QTC Calculation: 490 R Axis:   10 Text Interpretation:  Sinus rhythm with occasional Premature ventricular complexes Possible Inferior infarct , age undetermined Abnormal ECG Confirmed by Alvino Chapel  MD, Kenan Moodie ([id number]) on 12/24/2013 7:37:29 AM             Jasper Riling. Alvino Chapel, MD 12/24/13 [phone]

## 2014-01-03 ENCOUNTER — Encounter: Payer: Self-pay | Admitting: Critical Care Medicine

## 2014-01-03 ENCOUNTER — Ambulatory Visit (INDEPENDENT_AMBULATORY_CARE_PROVIDER_SITE_OTHER): Payer: Managed Care, Other (non HMO) | Admitting: Critical Care Medicine

## 2014-01-03 VITALS — BP 138/86 | HR 60 | Temp 97.6°F | Ht 73.0 in | Wt 214.0 lb

## 2014-01-03 DIAGNOSIS — J438 Other emphysema: Secondary | ICD-10-CM

## 2014-01-03 DIAGNOSIS — J439 Emphysema, unspecified: Secondary | ICD-10-CM

## 2014-01-03 DIAGNOSIS — I2589 Other forms of chronic ischemic heart disease: Secondary | ICD-10-CM

## 2014-01-03 MED ORDER — FLUTICASONE FUROATE-VILANTEROL 100-25 MCG/INH IN AEPB: 1.0000 | INHALATION_SPRAY | Freq: Every day | RESPIRATORY_TRACT | Status: DC

## 2014-01-03 NOTE — Progress Notes (Signed)
Subjective:    Patient ID: Corey Tyler, male    DOB: 1941-10-01, 73 y.o.   MRN: [id number]  HPI  73 y.o.    WM Golds Stage C Copd  01/03/2014 Chief Complaint  Patient presents with  . Follow-up    Breathing has improved. Improved dry cough. Denies Cp or tightness. Intermittent wheezing in the evening. SOB with activity.   Recent ED visit for tachycardia.  Works at [employer] and overexerted and HR went up.  No cough or mucus.  Pt did have a sinus infection, ABX for this from PCP. Pt denies any significant sore throat, nasal congestion or excess secretions, fever, chills, sweats, unintended weight loss, pleurtic or exertional chest pain, orthopnea PND, or leg swelling Pt denies any increase in rescue therapy over baseline, denies waking up needing it or having any early am or nocturnal exacerbations of coughing/wheezing/or dyspnea. Pt also denies any obvious fluctuation in symptoms with  weather or environmental change or other alleviating or aggravating factors    Past Medical History  Diagnosis Date  . Old myocardial infarction     a. ?Silent MI. b. Large fixed inferolateral defect c/w with prior infarct, no ischemia. c. Cath in 2000/2013 with only minimal CAD.  Marland Kitchen Hypertension   . COPD (chronic obstructive pulmonary disease)   . Hyperlipidemia   . Chronic systolic heart failure     EF down to 20 to 25% per echo November 2012  . NICM (nonischemic cardiomyopathy)     a. Normal cors 2000. b. Minimal plaque 2013.  . Pulmonary nodule, right     Last scan in 2010 showing stability; felt to be benign.  . Cardiac defibrillator -dual  [facility]   . Atrial tachycardia-non sustained     a. Noted on 03/2012 interrogation.  Marland Kitchen A-fib     a. Noted on 12/2012 interrogation, placed on Apixaban and ultimately stopped NOACs 2/2 personal decision 8/14.  Marland Kitchen PVC's (premature ventricular contractions)   . ICD (implantable cardiac defibrillator) in place   . Ventricular tachycardia, polymorphic     Rx  via ICD 12/14  . Arthritis      Family History  Problem Relation Age of Onset  . Emphysema Mother   . Heart disease Mother     AVR  . Heart failure Father     Died agwe 69     History   Social History  . Marital Status: Married    Spouse Name: N/A    Number of Children: 2  . Years of Education: N/A   Occupational History  . Retired     Press photographer  . Works [employer] History Main Topics  . Smoking status: Former Smoker -- 2.50 packs/day for 40 years    Types: Cigarettes    Quit date: 11/24/1983  . Smokeless tobacco: Never Used     Comment: 2 1/2 ppd x 40 years  . Alcohol Use: No  . Drug Use: No  . Sexual Activity: Not on file   Other Topics Concern  . Not on file   Social History Narrative   Still works at [employer] part-time   Lives with Wife in [place]  . Xarelto [Rivaroxaban] Other (See Comments)    Bleeding  . Adhesive [Tape] Hives, Itching and Rash  . Atorvastatin Other (See Comments)    cough     Outpatient Prescriptions Prior to Visit  Medication Sig Dispense Refill  .  amiodarone (PACERONE) 200 MG tablet Take 1 tablet (200 mg total) by mouth daily.  90 tablet  3  . apixaban (ELIQUIS) 5 MG TABS tablet Take 5 mg by mouth 2 (two) times daily.      . carvedilol (COREG) 12.5 MG tablet Take 1 tablet (12.5 mg total) by mouth 2 (two) times daily with a meal.  60 tablet  11  . cholecalciferol (VITAMIN D) 1000 UNITS tablet Take 1,000 Units by mouth daily.      . finasteride (PROSCAR) 5 MG tablet Take 5 mg by mouth every morning.      . furosemide (LASIX) 40 MG tablet Take 40 mg by mouth daily.      Marland Kitchen levothyroxine (SYNTHROID, LEVOTHROID) 25 MCG tablet Take 25-37.5 mcg by mouth daily before breakfast. On weekends--1 and 1/2 on Saturday and Sunday per PCP      . lisinopril (PRINIVIL,ZESTRIL) 40 MG tablet Take 40 mg by mouth daily.       . Melatonin 5 MG TABS Take 10 mg by mouth at bedtime as needed (for  sleep).      . nitroGLYCERIN (NITROSTAT) 0.4 MG SL tablet Place 0.4 mg under the tongue every 5 (five) minutes as needed. For chest pain      . Omega-3 Fatty Acids (FISH OIL) 1200 MG CAPS Take 1 capsule by mouth 2 (two) times daily.        . pantoprazole (PROTONIX) 40 MG tablet Take 40 mg by mouth at bedtime.       . Pediatric Multiple Vit-C-FA (MULTIVITAMIN ANIMAL SHAPES, WITH CA/FA,) WITH C & FA CHEW chewable tablet Chew 1 tablet by mouth daily.      . psyllium (METAMUCIL) 58.6 % powder Take 1 packet by mouth daily.       . rosuvastatin (CRESTOR) 5 MG tablet Take 5 mg by mouth at bedtime.       . Tamsulosin HCl (FLOMAX) 0.4 MG CAPS Take 0.4 mg by mouth at bedtime.       . vitamin B-12 (CYANOCOBALAMIN) 1000 MCG tablet Take 1,000 mcg by mouth daily.      . Fluticasone-Salmeterol (ADVAIR) 250-50 MCG/DOSE AEPB Inhale 1 puff into the lungs daily.      Marland Kitchen doxycycline (VIBRAMYCIN) 100 MG capsule Take 100 mg by mouth 2 (two) times daily.       No facility-administered medications prior to visit.      Review of Systems  Constitutional:   No  weight loss, night sweats,  Fevers, chills, fatigue, lassitude. HEENT:   No headaches,  Difficulty swallowing,  Tooth/dental problems,  Sore throat,                No sneezing, itching, ear ache, nasal congestion, post nasal drip,   CV:  No chest pain,  Orthopnea, PND, swelling in lower extremities, anasarca, dizziness, palpitations  GI  No heartburn, indigestion, abdominal pain, nausea, vomiting, diarrhea, change in bowel habits, loss of appetite  Resp: Notes  shortness of breath with exertion not  at rest.  No excess mucus, no productive cough,  Notes  non-productive cough,  No coughing up of blood.  No change in color of mucus.  No wheezing.  No chest wall deformity  Skin: no rash or lesions.  GU: no dysuria, change in color of urine, no urgency or frequency.  No flank pain.  MS:  No joint pain or swelling.  No decreased range of motion.  No back  pain.  Psych:  No change in  mood or affect. No depression or anxiety.  No memory loss.     Objective:   Physical Exam  Filed Vitals:   01/03/14 1054  BP: 138/86  Pulse: 60  Temp: 97.6 F (36.4 C)  TempSrc: Oral  Height: 6\' 1"  (1.854 m)  Weight: 214 lb (97.07 kg)  SpO2: 96%    Gen: Pleasant, well-nourished, in no distress,  normal affect  ENT: No lesions,  mouth clear,  oropharynx clear, no postnasal drip  Neck: No JVD, no TMG, no carotid bruits  Lungs: No use of accessory muscles, no dullness to percussion, distant BS  Cardiovascular: RRR, heart sounds normal, no murmur or gallops, no peripheral edema  Abdomen: soft and NT, no HSM,  BS normal  Musculoskeletal: No deformities, no cyanosis or clubbing  Neuro: alert, non focal  Skin: Warm, no lesions or rashes       Assessment & Plan:   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 Gold stage C copd no longer dependent on oxygen with exertion Plan Stop Advair Trial Breo one puff daily, if too expensive, return to advair and use one puff twice daily An overnight oxygen test will be obtained You do NOT need oxygen during the day, remember purse lip breathing Return 4 months     Updated Medication List Outpatient Encounter Prescriptions as of 01/03/2014  Medication Sig  . amiodarone (PACERONE) 200 MG tablet Take 1 tablet (200 mg total) by mouth daily.  Marland Kitchen apixaban (ELIQUIS) 5 MG TABS tablet Take 5 mg by mouth 2 (two) times daily.  . carvedilol (COREG) 12.5 MG tablet Take 1 tablet (12.5 mg total) by mouth 2 (two) times daily with a meal.  . cholecalciferol (VITAMIN D) 1000 UNITS tablet Take 1,000 Units by mouth daily.  . finasteride (PROSCAR) 5 MG tablet Take 5 mg by mouth every morning.  . furosemide (LASIX) 40 MG tablet Take 40 mg by mouth daily.  Marland Kitchen levothyroxine (SYNTHROID, LEVOTHROID) 25 MCG tablet Take 25-37.5 mcg by mouth daily before breakfast. On weekends--1 and 1/2 on Saturday and  Sunday per PCP  . lisinopril (PRINIVIL,ZESTRIL) 40 MG tablet Take 40 mg by mouth daily.   . Melatonin 5 MG TABS Take 10 mg by mouth at bedtime as needed (for sleep).  . nitroGLYCERIN (NITROSTAT) 0.4 MG SL tablet Place 0.4 mg under the tongue every 5 (five) minutes as needed. For chest pain  . Omega-3 Fatty Acids (FISH OIL) 1200 MG CAPS Take 1 capsule by mouth 2 (two) times daily.    . pantoprazole (PROTONIX) 40 MG tablet Take 40 mg by mouth at bedtime.   . Pediatric Multiple Vit-C-FA (MULTIVITAMIN ANIMAL SHAPES, WITH CA/FA,) WITH C & FA CHEW chewable tablet Chew 1 tablet by mouth daily.  . psyllium (METAMUCIL) 58.6 % powder Take 1 packet by mouth daily.   . rosuvastatin (CRESTOR) 5 MG tablet Take 5 mg by mouth at bedtime.   . Tamsulosin HCl (FLOMAX) 0.4 MG CAPS Take 0.4 mg by mouth at bedtime.   . vitamin B-12 (CYANOCOBALAMIN) 1000 MCG tablet Take 1,000 mcg by mouth daily.  . [DISCONTINUED] Fluticasone-Salmeterol (ADVAIR) 250-50 MCG/DOSE AEPB Inhale 1 puff into the lungs daily.  . Fluticasone Furoate-Vilanterol (BREO ELLIPTA) 100-25 MCG/INH AEPB Inhale 1 puff into the lungs daily.  . [DISCONTINUED] doxycycline (VIBRAMYCIN) 100 MG capsule Take 100 mg by mouth 2 (two) times daily.  . [DISCONTINUED] Fluticasone Furoate-Vilanterol (BREO ELLIPTA) 100-25 MCG/INH AEPB Inhale 1 puff into the lungs daily.

## 2014-01-03 NOTE — Patient Instructions (Signed)
Stop Advair Trial Breo one puff daily, if too expensive, return to advair and use one puff twice daily An overnight oxygen test will be obtained You do NOT need oxygen during the day, remember purse lip breathing Return 4 months

## 2014-01-04 NOTE — Assessment & Plan Note (Signed)
Gold stage C copd no longer dependent on oxygen with exertion Plan Stop Advair Trial Breo one puff daily, if too expensive, return to advair and use one puff twice daily An overnight oxygen test will be obtained You do NOT need oxygen during the day, remember purse lip breathing Return 4 months

## 2014-01-11 ENCOUNTER — Telehealth: Payer: Self-pay | Admitting: Critical Care Medicine

## 2014-01-11 DIAGNOSIS — J439 Emphysema, unspecified: Secondary | ICD-10-CM

## 2014-01-11 NOTE — Telephone Encounter (Signed)
ONO pos desat 70% 01/11/2014 Pt needs Oxygen 2L  He has equip from [facility]. Send them results of ONO   Pt will wear 2L qhs

## 2014-01-11 NOTE — Telephone Encounter (Signed)
Results sent to [facility]. Spoke with pt.  He already has o2 to use with exertion and is using it 2lpm qhs.

## 2014-01-27 ENCOUNTER — Other Ambulatory Visit: Payer: Self-pay | Admitting: Cardiology

## 2014-02-05 DIAGNOSIS — I10 Essential (primary) hypertension: Secondary | ICD-10-CM | POA: Diagnosis not present

## 2014-02-05 DIAGNOSIS — E039 Hypothyroidism, unspecified: Secondary | ICD-10-CM | POA: Diagnosis not present

## 2014-03-18 ENCOUNTER — Ambulatory Visit: Payer: Managed Care, Other (non HMO)

## 2014-03-18 ENCOUNTER — Ambulatory Visit (INDEPENDENT_AMBULATORY_CARE_PROVIDER_SITE_OTHER): Payer: Managed Care, Other (non HMO) | Admitting: Physician Assistant

## 2014-03-18 VITALS — BP 110/60 | HR 78 | Temp 98.6°F | Resp 18 | Ht 71.25 in | Wt 208.0 lb

## 2014-03-18 DIAGNOSIS — R0602 Shortness of breath: Secondary | ICD-10-CM

## 2014-03-18 DIAGNOSIS — R059 Cough, unspecified: Secondary | ICD-10-CM

## 2014-03-18 DIAGNOSIS — J441 Chronic obstructive pulmonary disease with (acute) exacerbation: Secondary | ICD-10-CM

## 2014-03-18 DIAGNOSIS — R05 Cough: Secondary | ICD-10-CM

## 2014-03-18 MED ORDER — IPRATROPIUM BROMIDE 0.02 % IN SOLN
0.5000 mg | Freq: Once | RESPIRATORY_TRACT | Status: AC
  Administered 2014-03-18: 0.5 mg via RESPIRATORY_TRACT

## 2014-03-18 MED ORDER — DOXYCYCLINE HYCLATE 100 MG PO CAPS: 100.0000 mg | ORAL_CAPSULE | Freq: Two times a day (BID) | ORAL | Status: DC

## 2014-03-18 MED ORDER — ALBUTEROL SULFATE (2.5 MG/3ML) 0.083% IN NEBU
2.5000 mg | INHALATION_SOLUTION | Freq: Once | RESPIRATORY_TRACT | Status: AC
  Administered 2014-03-18: 2.5 mg via RESPIRATORY_TRACT

## 2014-03-18 MED ORDER — HYDROCOD POLST-CHLORPHEN POLST 10-8 MG/5ML PO LQCR: 5.0000 mL | Freq: Two times a day (BID) | ORAL | Status: DC | PRN

## 2014-03-18 MED ORDER — PREDNISONE 20 MG PO TABS: ORAL_TABLET | ORAL | Status: DC

## 2014-03-18 MED ORDER — ALBUTEROL SULFATE (2.5 MG/3ML) 0.083% IN NEBU: 2.5000 mg | INHALATION_SOLUTION | Freq: Four times a day (QID) | RESPIRATORY_TRACT | Status: DC | PRN

## 2014-03-18 MED ORDER — AZITHROMYCIN 500 MG PO TABS
500.0000 mg | ORAL_TABLET | Freq: Every day | ORAL | Status: DC
Start: 2014-03-18 — End: 2014-03-18

## 2014-03-18 NOTE — Patient Instructions (Signed)
Get plenty of rest and drink at least 64 ounces of water daily. Use OTC Mucinex to thin the mucous. If your symptoms worsen or persist, please seek re-evaluation.

## 2014-03-18 NOTE — Progress Notes (Signed)
Subjective:    Patient ID: Corey Tyler, male    DOB: Nov 05, 1941, 73 y.o.   MRN: [id number]  HPI  73y.o male with COPD, hx of MI and CHF presents with chest congestion for past 5 days.  Started as a sore throat with nasal congestion.  Now with painful sinus pressure and productive cough with brownish yellow sputum.  Associated itchy eyes with clear drainage with some lash crusting upon waking.   Has chest wall pain with coughing.  Pt has not been able to sleep due to cough.  Pt feels like he is more SOB than usual due to the congestion.  Uses 2L O2 at night.  Baseline PO2 around 90-91% on RA.  86% on RA in office today.  Last chest xray in 1/30 showing emphysematous changes.  Denies peripheral edema, orthopnea, fever, N/V/D, changes in uriantion.    Review of Systems  Constitutional: Negative for fever, chills, fatigue and unexpected weight change.  HENT: Positive for congestion, rhinorrhea, sinus pressure and sore throat. Negative for ear pain.   Eyes: Positive for discharge, redness and itching.  Respiratory: Positive for cough and shortness of breath. Negative for wheezing.   Cardiovascular: Negative for chest pain, palpitations and leg swelling.  Gastrointestinal: Negative.   Endocrine: Negative.   Genitourinary: Negative.   Musculoskeletal: Negative.   Skin: Negative for rash.  Allergic/Immunologic: Negative.   Neurological: Negative.   Hematological: Negative.        Objective:   Physical Exam  Constitutional: He is oriented to person, place, and time. He appears well-developed and well-nourished. No distress.  BP 110/60  Pulse 78  Temp(Src) 98.6 F (37 C) (Oral)  Resp 18  Ht 5' 11.25" (1.81 m)  Wt 208 lb (94.348 kg)  BMI 28.80 kg/m2  SpO2 86%   HENT:  Head: Normocephalic.  Right Ear: External ear normal.  Left Ear: External ear normal.  Nose: Nose normal.  Eyes: Conjunctivae are normal. Pupils are equal, round, and reactive to light.  Neck: Normal range of  motion.  Cardiovascular: Normal rate, regular rhythm, normal heart sounds and intact distal pulses.  Exam reveals no gallop and no friction rub.   No murmur heard. Pulmonary/Chest: Effort normal. No respiratory distress.  Rhonchi heard left base  Musculoskeletal: He exhibits no edema.  Lymphadenopathy:    He has cervical adenopathy.  Neurological: He is alert and oriented to person, place, and time.  Skin: Skin is warm and dry. No rash noted.  Psychiatric: He has a normal mood and affect. His behavior is normal.    CXR reviewed by Dr. Tamala Julian:  Defibrillator noted.  Compared with films from 11/18/2013 and 12/22/2013, consistent chronic COPD changes and cardiomegaly.  Increased markings noted bilaterally, but no definitive infiltrates noted today.  Albuterol and Atrovent nebulizer: Provided significant improvement in symptoms and significant improvement in airflow bilat on repeat exam     Assessment & Plan:   1. Cough 2. SOB (shortness of breath) 3. COPD exacerbation CXR showing increased markings however no definitive infiltrates.  Because pt benefited so much from neb treatment, a home nebulizer was prescribed.  Pt will return if symptoms worsen or fail to improve.   - DG Chest 2 View; Future - albuterol (PROVENTIL) (2.5 MG/3ML) 0.083% nebulizer solution 2.5 mg; Take 3 mLs (2.5 mg total) by nebulization once. - ipratropium (ATROVENT) nebulizer solution 0.5 mg; Take 2.5 mLs (0.5 mg total) by nebulization once. - predniSONE (DELTASONE) 20 MG tablet; Take 3 PO QAM x3days,  2 PO QAM x3days, 1 PO QAM x3days  Dispense: 18 tablet; Refill: 0 - chlorpheniramine-HYDROcodone (TUSSIONEX PENNKINETIC ER) 10-8 MG/5ML LQCR; Take 5 mLs by mouth every 12 (twelve) hours as needed for cough (cough).  Dispense: 100 mL; Refill: 0 - doxycycline (VIBRAMYCIN) 100 MG capsule; Take 1 capsule (100 mg total) by mouth 2 (two) times daily.  Dispense: 20 capsule; Refill: 0 - For home use only DME Nebulizer machine -  albuterol (PROVENTIL) (2.5 MG/3ML) 0.083% nebulizer solution; Take 3 mLs (2.5 mg total) by nebulization every 6 (six) hours as needed for wheezing or shortness of breath.  Dispense: 150 mL; Refill: 1

## 2014-03-18 NOTE — Progress Notes (Signed)
I have examined this patient along with the student and agree.  

## 2014-03-27 ENCOUNTER — Other Ambulatory Visit: Payer: Self-pay | Admitting: *Deleted

## 2014-03-27 ENCOUNTER — Telehealth: Payer: Self-pay | Admitting: Cardiology

## 2014-03-27 DIAGNOSIS — I10 Essential (primary) hypertension: Secondary | ICD-10-CM

## 2014-03-27 DIAGNOSIS — I4891 Unspecified atrial fibrillation: Secondary | ICD-10-CM

## 2014-03-27 DIAGNOSIS — I5022 Chronic systolic (congestive) heart failure: Secondary | ICD-10-CM

## 2014-03-27 DIAGNOSIS — I2589 Other forms of chronic ischemic heart disease: Secondary | ICD-10-CM

## 2014-03-27 MED ORDER — FUROSEMIDE 40 MG PO TABS: ORAL_TABLET | ORAL | Status: DC

## 2014-03-27 NOTE — Telephone Encounter (Signed)
New Message:  Pt is requesting to have blood work drawn before his next appt. Pt states he would like his cholesterol and liver function checked. He also states he has not had any blood work since he started eloquis and would like to have his blood checked for that.  Pt has no orders in Epic.

## 2014-03-27 NOTE — Telephone Encounter (Signed)
Pt has an appointment on 5/29.  What lab would you like for him to have?

## 2014-03-29 NOTE — Telephone Encounter (Signed)
Spoke with patient and scheduled his lab appointment; orders in epic

## 2014-03-29 NOTE — Telephone Encounter (Signed)
Needs TSH, CMET and fasting lipid.

## 2014-04-02 ENCOUNTER — Other Ambulatory Visit: Payer: Self-pay | Admitting: Cardiology

## 2014-04-17 ENCOUNTER — Other Ambulatory Visit (INDEPENDENT_AMBULATORY_CARE_PROVIDER_SITE_OTHER): Payer: Managed Care, Other (non HMO)

## 2014-04-17 DIAGNOSIS — I2589 Other forms of chronic ischemic heart disease: Secondary | ICD-10-CM | POA: Diagnosis not present

## 2014-04-17 DIAGNOSIS — I5022 Chronic systolic (congestive) heart failure: Secondary | ICD-10-CM | POA: Diagnosis not present

## 2014-04-17 DIAGNOSIS — I4891 Unspecified atrial fibrillation: Secondary | ICD-10-CM

## 2014-04-17 DIAGNOSIS — I10 Essential (primary) hypertension: Secondary | ICD-10-CM

## 2014-04-17 LAB — BASIC METABOLIC PANEL
BUN: 19 mg/dL (ref 6–23)
CALCIUM: 8.6 mg/dL (ref 8.4–10.5)
CO2: 29 mEq/L (ref 19–32)
CREATININE: 1.4 mg/dL (ref 0.4–1.5)
Chloride: 104 mEq/L (ref 96–112)
GFR: 53.52 mL/min — ABNORMAL LOW (ref >60.00)
Glucose, Bld: 87 mg/dL (ref 70–99)
Potassium: 3.6 mEq/L (ref 3.5–5.1)
Sodium: 139 mEq/L (ref 135–145)

## 2014-04-17 LAB — HEPATIC FUNCTION PANEL
ALBUMIN: 3.2 g/dL — AB (ref 3.5–5.2)
ALK PHOS: 54 U/L (ref 39–117)
ALT: 23 U/L (ref 0–53)
AST: 19 U/L (ref 0–37)
BILIRUBIN DIRECT: 0.2 mg/dL (ref 0.0–0.3)
TOTAL PROTEIN: 5.9 g/dL — AB (ref 6.0–8.3)
Total Bilirubin: 1.1 mg/dL (ref 0.2–1.2)

## 2014-04-17 LAB — LIPID PANEL
CHOL/HDL RATIO: 2
CHOLESTEROL: 113 mg/dL (ref 0–200)
HDL: 49.8 mg/dL (ref >39.00)
LDL Cholesterol: 52 mg/dL (ref 0–99)
TRIGLYCERIDES: 58 mg/dL (ref 0.0–149.0)
VLDL: 11.6 mg/dL (ref 0.0–40.0)

## 2014-04-17 LAB — TSH: TSH: 8.63 u[IU]/mL — ABNORMAL HIGH (ref 0.35–4.50)

## 2014-04-20 ENCOUNTER — Ambulatory Visit (INDEPENDENT_AMBULATORY_CARE_PROVIDER_SITE_OTHER): Payer: Managed Care, Other (non HMO) | Admitting: Cardiology

## 2014-04-20 ENCOUNTER — Other Ambulatory Visit: Payer: Self-pay | Admitting: *Deleted

## 2014-04-20 ENCOUNTER — Encounter: Payer: Self-pay | Admitting: Cardiology

## 2014-04-20 ENCOUNTER — Ambulatory Visit (INDEPENDENT_AMBULATORY_CARE_PROVIDER_SITE_OTHER): Payer: Managed Care, Other (non HMO) | Admitting: *Deleted

## 2014-04-20 VITALS — BP 123/71 | HR 63 | Ht 71.25 in | Wt 213.0 lb

## 2014-04-20 DIAGNOSIS — I428 Other cardiomyopathies: Secondary | ICD-10-CM | POA: Diagnosis not present

## 2014-04-20 DIAGNOSIS — I472 Ventricular tachycardia: Secondary | ICD-10-CM

## 2014-04-20 DIAGNOSIS — I2589 Other forms of chronic ischemic heart disease: Secondary | ICD-10-CM

## 2014-04-20 DIAGNOSIS — I4729 Other ventricular tachycardia: Secondary | ICD-10-CM | POA: Diagnosis not present

## 2014-04-20 DIAGNOSIS — Z95810 Presence of automatic (implantable) cardiac defibrillator: Secondary | ICD-10-CM

## 2014-04-20 DIAGNOSIS — I4891 Unspecified atrial fibrillation: Secondary | ICD-10-CM

## 2014-04-20 DIAGNOSIS — I5022 Chronic systolic (congestive) heart failure: Secondary | ICD-10-CM

## 2014-04-20 LAB — MDC_IDC_ENUM_SESS_TYPE_INCLINIC
Battery Remaining Longevity: 73.2 mo
Brady Statistic RA Percent Paced: 54 %
Brady Statistic RV Percent Paced: 0.36 %
Date Time Interrogation Session: 20150529152456
HighPow Impedance: 70.875
Implantable Pulse Generator Serial Number: 7003419
Lead Channel Impedance Value: 412.5 Ohm
Lead Channel Pacing Threshold Amplitude: 0.5 V
Lead Channel Pacing Threshold Amplitude: 1 V
Lead Channel Pacing Threshold Pulse Width: 0.5 ms
Lead Channel Pacing Threshold Pulse Width: 0.6 ms
Lead Channel Setting Pacing Amplitude: 2 V
Lead Channel Setting Pacing Pulse Width: 0.6 ms
MDC IDC MSMT LEADCHNL RA SENSING INTR AMPL: 3.2 mV
MDC IDC MSMT LEADCHNL RV IMPEDANCE VALUE: 350 Ohm
MDC IDC MSMT LEADCHNL RV SENSING INTR AMPL: 12 mV
MDC IDC SET LEADCHNL RV PACING AMPLITUDE: 2.5 V
MDC IDC SET LEADCHNL RV SENSING SENSITIVITY: 0.5 mV
Zone Setting Detection Interval: 250 ms
Zone Setting Detection Interval: 300 ms

## 2014-04-20 MED ORDER — CARVEDILOL 3.125 MG PO TABS
3.1250 mg | ORAL_TABLET | Freq: Two times a day (BID) | ORAL | Status: DC
Start: 2014-04-20 — End: 2014-06-26

## 2014-04-20 MED ORDER — APIXABAN 5 MG PO TABS: ORAL_TABLET | ORAL | Status: DC

## 2014-04-20 MED ORDER — CARVEDILOL 3.125 MG PO TABS: 3.1250 mg | ORAL_TABLET | Freq: Two times a day (BID) | ORAL | Status: DC

## 2014-04-20 NOTE — Progress Notes (Signed)
HPI The patient presents for follow up of acute on chronic systolic heart failure and atrial fibrillation.  He has had GI bleeding last fall on Xarelto but is tolerating Eliquis.  He had  a TSH of 17.57 probably related to amiodarone and has been placed on thyroid replacement therapy and his last TSH was 8.  Most recently in December at home he had polymorphic ventricular tachycardia. The device tried to pace terminate x2 without success. A 40 J shock restored sinus rhythm. He had a followup perfusion study that demonstrated a previous inferior large fixed defect but no ischemia.  Dr. [name] saw him in follow up and titrated his Coreg  He returns for follow up.  He is feeling well.  The patient denies any new symptoms such as chest discomfort, neck or arm discomfort. There has been no new shortness of breath, PND or orthopnea. There have been no reported palpitations, presyncope or syncope.  He is still working.     Allergies  Allergen Reactions  . Xarelto [Rivaroxaban] Other (See Comments)    Bleeding  . Adhesive [Tape] Hives, Itching and Rash  . Atorvastatin Other (See Comments)    cough    Current Outpatient Prescriptions  Medication Sig Dispense Refill  . albuterol (PROVENTIL) (2.5 MG/3ML) 0.083% nebulizer solution Take 3 mLs (2.5 mg total) by nebulization every 6 (six) hours as needed for wheezing or shortness of breath.  150 mL  1  . amiodarone (PACERONE) 200 MG tablet Take 1 tablet (200 mg total) by mouth daily.  90 tablet  3  . carvedilol (COREG) 12.5 MG tablet Take 1 tablet (12.5 mg total) by mouth 2 (two) times daily with a meal.  60 tablet  11  . chlorpheniramine-HYDROcodone (TUSSIONEX PENNKINETIC ER) 10-8 MG/5ML LQCR Take 5 mLs by mouth every 12 (twelve) hours as needed for cough (cough).  100 mL  0  . cholecalciferol (VITAMIN D) 1000 UNITS tablet Take 1,000 Units by mouth daily.      Marland Kitchen doxycycline (VIBRAMYCIN) 100 MG capsule Take 1 capsule (100 mg total) by mouth 2 (two) times  daily.  20 capsule  0  . ELIQUIS 5 MG TABS tablet TAKE 1 TABLET BY MOUTH TWICE A DAY  60 tablet  6  . finasteride (PROSCAR) 5 MG tablet Take 5 mg by mouth every morning.      . Fluticasone Furoate-Vilanterol (BREO ELLIPTA) 100-25 MCG/INH AEPB Inhale 1 puff into the lungs daily.  60 each  6  . furosemide (LASIX) 40 MG tablet TAKE 1 TABLET BY MOUTH EVERY DAY  90 tablet  0  . levothyroxine (SYNTHROID, LEVOTHROID) 25 MCG tablet Take 25-37.5 mcg by mouth daily before breakfast. On weekends--1 and 1/2 on Saturday and Sunday per PCP      . lisinopril (PRINIVIL,ZESTRIL) 40 MG tablet Take 40 mg by mouth daily.       . Melatonin 5 MG TABS Take 10 mg by mouth at bedtime as needed (for sleep).      . nitroGLYCERIN (NITROSTAT) 0.4 MG SL tablet Place 0.4 mg under the tongue every 5 (five) minutes as needed. For chest pain      . Omega-3 Fatty Acids (FISH OIL) 1200 MG CAPS Take 1 capsule by mouth 2 (two) times daily.        . pantoprazole (PROTONIX) 40 MG tablet Take 40 mg by mouth at bedtime.       . Pediatric Multiple Vit-C-FA (MULTIVITAMIN ANIMAL SHAPES, WITH CA/FA,) WITH C & FA CHEW  chewable tablet Chew 1 tablet by mouth daily.      . predniSONE (DELTASONE) 20 MG tablet Take 3 PO QAM x3days, 2 PO QAM x3days, 1 PO QAM x3days  18 tablet  0  . psyllium (METAMUCIL) 58.6 % powder Take 1 packet by mouth daily.       . rosuvastatin (CRESTOR) 5 MG tablet Take 5 mg by mouth at bedtime.       . Tamsulosin HCl (FLOMAX) 0.4 MG CAPS Take 0.4 mg by mouth at bedtime.       . vitamin B-12 (CYANOCOBALAMIN) 1000 MCG tablet Take 1,000 mcg by mouth daily.       No current facility-administered medications for this visit.    Past Medical History  Diagnosis Date  . Old myocardial infarction     a. ?Silent MI. b. Large fixed inferolateral defect c/w with prior infarct, no ischemia. c. Cath in 2000/2013 with only minimal CAD.  Marland Kitchen Hypertension   . COPD (chronic obstructive pulmonary disease)   . Hyperlipidemia   . Chronic  systolic heart failure     EF down to 20 to 25% per echo November 2012  . NICM (nonischemic cardiomyopathy)     a. Normal cors 2000. b. Minimal plaque 2013.  . Pulmonary nodule, right     Last scan in 2010 showing stability; felt to be benign.  . Cardiac defibrillator -dual  [facility]   . Atrial tachycardia-non sustained     a. Noted on 03/2012 interrogation.  Marland Kitchen A-fib     a. Noted on 12/2012 interrogation, placed on Apixaban and ultimately stopped NOACs 2/2 personal decision 8/14.  Marland Kitchen PVC's (premature ventricular contractions)   . ICD (implantable cardiac defibrillator) in place   . Ventricular tachycardia, polymorphic     Rx via ICD 12/14  . Arthritis     Past Surgical History  Procedure Laterality Date  . Colon surgery    . Cardiac catheterization  2000  . Icd  12/2011    [name]  . Cardiac defibrillator placement    . Appendectomy    . Tee without cardioversion N/A 03/30/2013    Procedure: TRANSESOPHAGEAL ECHOCARDIOGRAM (TEE);  Surgeon: Thayer Headings, MD;  Location: [facility];  Service: Cardiovascular;  Laterality: N/A;  . Cardioversion  03/30/2013    Procedure: CARDIOVERSION;  Surgeon: Thayer Headings, MD;  Location: [facility];  Service: Cardiovascular;;  . Colonoscopy N/A 09/25/2013    Procedure: COLONOSCOPY;  Surgeon: Jerene Bears, MD;  Location: [facility];  Service: Endoscopy;  Laterality: N/A;    ROS:  As stated in the HPI and negative for all other systems.   PHYSICAL EXAM BP 123/71  Pulse 63  Ht 5' 11.25" (1.81 m)  Wt 213 lb (96.616 kg)  BMI 29.49 kg/m2 GENERAL:  Well appearing NECK:  No jugular venous distention but hepatojugular reflux noted, waveform within normal limits, carotid upstroke brisk and symmetric, no bruits, no thyromegaly LUNGS:  Clear to auscultation bilaterally BACK:  No CVA tenderness CHEST:  Well healed ICD pocket.   HEART:  PMI not displaced or sustained,S1 and S2 within normal limits, no S3, no S4, no clicks, no rubs, no murmurs,  regular ABD:  Flat, positive bowel sounds normal in frequency in pitch, no bruits, no rebound, no guarding, no midline pulsatile mass, no hepatomegaly, no splenomegaly EXT:  2 plus pulses throughout, mild ankle edema, no cyanosis no clubbing  EKG:  Atrial paced rate 63, axis within normal limits, intervals within normal limits, no  acute ST-T wave changes, nonspecific T-wave flattening,  Old inferior infarct..  04/20/2014   ASSESSMENT AND PLAN   Ischemic cardiomyopathy - He seems to be euvolemic.   However, I will be trying to titrate his meds slowly further. He has class II symptoms.   I will increase the carvedilol by adding another 3.125 mg twice daily.  Atrial fibrillation - He will continue on amiodarone at the current dose.  He did have a very mildly elevated TSH recently. However, liver enzymes were okay. He is tolerating his current blood thinner.  HYPERTENSION -  This is being managed in the context of treating his CHF.  CAD - The patient has no new sypmtoms.   He had only minimal plaque previous catheterization. No change in therapy is indicated.

## 2014-04-20 NOTE — Progress Notes (Signed)
ICD check in clinic. Normal device function. Thresholds and sensing consistent with previous device measurements. Impedance trends stable over time. No evidence of any ventricular arrhythmias. No mode switches. Histogram distribution appropriate for patient and level of activity. No changes made this session. Device programmed at appropriate safety margins. Device programmed to optimize intrinsic conduction. Estimated longevity 5.0 to 6.0 years.  ROV in 3 mths w/SK.

## 2014-04-20 NOTE — Patient Instructions (Addendum)
Please increase your carvedilol by 3.125 mg twice a day. Continue all other medications as listed.  Follow up with Truitt Merle in 2 months.

## 2014-05-03 ENCOUNTER — Encounter: Payer: Self-pay | Admitting: Internal Medicine

## 2014-05-08 ENCOUNTER — Other Ambulatory Visit: Payer: Self-pay

## 2014-05-08 MED ORDER — AMIODARONE HCL 200 MG PO TABS: 200.0000 mg | ORAL_TABLET | Freq: Every day | ORAL | Status: DC

## 2014-05-15 DIAGNOSIS — R7309 Other abnormal glucose: Secondary | ICD-10-CM | POA: Diagnosis not present

## 2014-05-15 DIAGNOSIS — E039 Hypothyroidism, unspecified: Secondary | ICD-10-CM | POA: Diagnosis not present

## 2014-05-15 DIAGNOSIS — E785 Hyperlipidemia, unspecified: Secondary | ICD-10-CM | POA: Diagnosis not present

## 2014-05-22 DIAGNOSIS — I10 Essential (primary) hypertension: Secondary | ICD-10-CM | POA: Diagnosis not present

## 2014-05-22 DIAGNOSIS — R972 Elevated prostate specific antigen [PSA]: Secondary | ICD-10-CM | POA: Diagnosis not present

## 2014-05-22 DIAGNOSIS — E039 Hypothyroidism, unspecified: Secondary | ICD-10-CM | POA: Diagnosis not present

## 2014-05-22 DIAGNOSIS — E785 Hyperlipidemia, unspecified: Secondary | ICD-10-CM | POA: Diagnosis not present

## 2014-06-21 ENCOUNTER — Other Ambulatory Visit: Payer: Self-pay | Admitting: *Deleted

## 2014-06-21 MED ORDER — FUROSEMIDE 40 MG PO TABS: ORAL_TABLET | ORAL | Status: DC

## 2014-06-26 ENCOUNTER — Encounter: Payer: Self-pay | Admitting: Nurse Practitioner

## 2014-06-26 ENCOUNTER — Ambulatory Visit (INDEPENDENT_AMBULATORY_CARE_PROVIDER_SITE_OTHER): Payer: Managed Care, Other (non HMO) | Admitting: Nurse Practitioner

## 2014-06-26 VITALS — BP 120/64 | HR 60 | Ht 73.0 in | Wt 213.1 lb

## 2014-06-26 DIAGNOSIS — I2589 Other forms of chronic ischemic heart disease: Secondary | ICD-10-CM

## 2014-06-26 DIAGNOSIS — R0602 Shortness of breath: Secondary | ICD-10-CM

## 2014-06-26 DIAGNOSIS — I472 Ventricular tachycardia, unspecified: Secondary | ICD-10-CM

## 2014-06-26 DIAGNOSIS — I5022 Chronic systolic (congestive) heart failure: Secondary | ICD-10-CM | POA: Diagnosis not present

## 2014-06-26 DIAGNOSIS — I4729 Other ventricular tachycardia: Secondary | ICD-10-CM | POA: Diagnosis not present

## 2014-06-26 NOTE — Progress Notes (Signed)
Corey Tyler Date of Birth: 23-Jun-1941 Medical Record #[id number]  History of Present Illness: [name] is seen back today for a 2 month check. Seen for Dr. Percival Spanish. He is a 73 year old male with chronic systolic HF and AF -  Cardioverted in 2014. Has had past GI bleed on Xarelto but tolerates Eliquis without issue. He is on amiodarone. Has had past polymorphic VT with ICD discharge x 1 (December 2014) - has ICD in place. Has developed hypothyroidism - most likely from his amiodarone. Last Myoview to rule out ischemia following his ICD shock showed large scar. His other issues include HTN, COPD, HLD, PVCs, atrial tach and OA. He has ?silent past MI with cath in 2000 and again in 2013 with minimal CAD.   Dr. Percival Spanish saw him back at the end of May - increased his dose of Coreg further. Patient was doing well. Has had worsening thyroid levels.   Comes back today. Here alone. He feels more short of breath. Worse since the increase in Coreg. He left his medicines off one day - just forgot - felt better however. Next day, he resumed everything and "felt like crap". He is concerned about his Coreg and amiodarone being the culprit for his dyspnea. He is on oxygen just at night. Weight up 5 pounds since April. Still trying to get his thyroid adjusted per Dr. Maudie Mercury. No chest pain. No palpitations. Seeing Dr. [name] in one month.   Current Outpatient Prescriptions  Medication Sig Dispense Refill  . ADVAIR DISKUS 250-50 MCG/DOSE AEPB       . albuterol (PROVENTIL) (2.5 MG/3ML) 0.083% nebulizer solution Take 3 mLs (2.5 mg total) by nebulization every 6 (six) hours as needed for wheezing or shortness of breath.  150 mL  1  . amiodarone (PACERONE) 200 MG tablet Take 1 tablet (200 mg total) by mouth daily.  90 tablet  3  . apixaban (ELIQUIS) 5 MG TABS tablet TAKE 1 TABLET BY MOUTH TWICE A DAY  180 tablet  3  . carvedilol (COREG) 12.5 MG tablet Take 1 tablet (12.5 mg total) by mouth 2 (two) times daily with a  meal.  60 tablet  11  . carvedilol (COREG) 3.125 MG tablet Take 1 tablet (3.125 mg total) by mouth 2 (two) times daily.  180 tablet  1  . cholecalciferol (VITAMIN D) 1000 UNITS tablet Take 1,000 Units by mouth daily.      . finasteride (PROSCAR) 5 MG tablet Take 5 mg by mouth every morning.      . Fluticasone Furoate-Vilanterol (BREO ELLIPTA) 100-25 MCG/INH AEPB Inhale 1 puff into the lungs daily.  60 each  6  . furosemide (LASIX) 40 MG tablet TAKE 1 TABLET BY MOUTH EVERY DAY  90 tablet  0  . levothyroxine (SYNTHROID, LEVOTHROID) 25 MCG tablet Take 25-37.5 mcg by mouth daily before breakfast. On weekends--1 and 1/2 on Saturday and Sunday per PCP      . lisinopril (PRINIVIL,ZESTRIL) 40 MG tablet Take 40 mg by mouth daily.       Marland Kitchen losartan (COZAAR) 100 MG tablet       . Melatonin 5 MG TABS Take 10 mg by mouth at bedtime as needed (for sleep).      . nitroGLYCERIN (NITROSTAT) 0.4 MG SL tablet Place 0.4 mg under the tongue every 5 (five) minutes as needed. For chest pain      . NON FORMULARY Place 2 L into the nose at bedtime.      Marland Kitchen  Omega-3 Fatty Acids (FISH OIL) 1200 MG CAPS Take 1 capsule by mouth 2 (two) times daily.        . pantoprazole (PROTONIX) 40 MG tablet Take 40 mg by mouth at bedtime.       . Pediatric Multiple Vit-C-FA (MULTIVITAMIN ANIMAL SHAPES, WITH CA/FA,) WITH C & FA CHEW chewable tablet Chew 1 tablet by mouth daily.      . psyllium (METAMUCIL) 58.6 % powder Take 1 packet by mouth daily.       . rosuvastatin (CRESTOR) 5 MG tablet Take 5 mg by mouth at bedtime.       . Tamsulosin HCl (FLOMAX) 0.4 MG CAPS Take 0.4 mg by mouth at bedtime.       . vitamin B-12 (CYANOCOBALAMIN) 1000 MCG tablet Take 1,000 mcg by mouth daily.       No current facility-administered medications for this visit.    Allergies  Allergen Reactions  . Xarelto [Rivaroxaban] Other (See Comments)    Bleeding  . Adhesive [Tape] Hives, Itching and Rash  . Atorvastatin Other (See Comments)    cough    Past  Medical History  Diagnosis Date  . Old myocardial infarction     a. ?Silent MI. b. Large fixed inferolateral defect c/w with prior infarct, no ischemia. c. Cath in 2000/2013 with only minimal CAD.  Marland Kitchen Hypertension   . COPD (chronic obstructive pulmonary disease)   . Hyperlipidemia   . Chronic systolic heart failure     EF down to 20 to 25% per echo November 2012  . NICM (nonischemic cardiomyopathy)     a. Normal cors 2000. b. Minimal plaque 2013.  . Pulmonary nodule, right     Last scan in 2010 showing stability; felt to be benign.  . Cardiac defibrillator -dual  [facility]   . Atrial tachycardia-non sustained     a. Noted on 03/2012 interrogation.  Marland Kitchen A-fib     a. Noted on 12/2012 interrogation, placed on Apixaban and ultimately stopped NOACs 2/2 personal decision 8/14.  Marland Kitchen PVC's (premature ventricular contractions)   . ICD (implantable cardiac defibrillator) in place   . Ventricular tachycardia, polymorphic     Rx via ICD 12/14  . Arthritis     Past Surgical History  Procedure Laterality Date  . Colon surgery    . Cardiac catheterization  2000  . Icd  12/2011    [name]  . Cardiac defibrillator placement    . Appendectomy    . Tee without cardioversion N/A 03/30/2013    Procedure: TRANSESOPHAGEAL ECHOCARDIOGRAM (TEE);  Surgeon: Thayer Headings, MD;  Location: [facility];  Service: Cardiovascular;  Laterality: N/A;  . Cardioversion  03/30/2013    Procedure: CARDIOVERSION;  Surgeon: Thayer Headings, MD;  Location: [facility];  Service: Cardiovascular;;  . Colonoscopy N/A 09/25/2013    Procedure: COLONOSCOPY;  Surgeon: Jerene Bears, MD;  Location: [facility];  Service: Endoscopy;  Laterality: N/A;    History  Smoking status  . Former Smoker -- 2.50 packs/day for 40 years  . Types: Cigarettes  . Quit date: 11/24/1983  Smokeless tobacco  . Never Used    Comment: 2 1/2 ppd x 40 years    History  Alcohol Use No    Family History  Problem Relation Age of Onset  . Emphysema  Mother   . Heart disease Mother     AVR  . Heart failure Father     Died agwe 32    Review of Systems: The  review of systems is per the HPI.  All other systems were reviewed and are negative.  Physical Exam: BP 120/64  Pulse 60  Ht 6\' 1"  (1.854 m)  Wt 213 lb 1.9 oz (96.671 kg)  BMI 28.12 kg/m2  SpO2 84% His oxygen sat was 84% on arrival and up to 88% with deep breathing.  Patient is very pleasant and in no acute distress. Skin is warm and dry. Color is normal.  HEENT is unremarkable. Normocephalic/atraumatic. PERRL. Sclera are nonicteric. Neck is supple. No masses. No JVD. Lungs are clear. Cardiac exam shows a regular rate and rhythm. Abdomen is soft. Extremities are without edema. Gait and ROM are intact. No gross neurologic deficits noted.  Wt Readings from Last 3 Encounters:  06/26/14 213 lb 1.9 oz (96.671 kg)  04/20/14 213 lb (96.616 kg)  03/18/14 208 lb (94.348 kg)    LABORATORY DATA/PROCEDURES:  Lab Results  Component Value Date   WBC 9.0 12/22/2013   HGB 14.2 12/22/2013   HCT 41.3 12/22/2013   PLT 211 12/22/2013   GLUCOSE 87 04/17/2014   CHOL 113 04/17/2014   TRIG 58.0 04/17/2014   HDL 49.80 04/17/2014   LDLCALC 52 04/17/2014   ALT 23 04/17/2014   AST 19 04/17/2014   NA 139 04/17/2014   K 3.6 04/17/2014   CL 104 04/17/2014   CREATININE 1.4 04/17/2014   BUN 19 04/17/2014   CO2 29 04/17/2014   TSH 8.63* 04/17/2014   INR 1.15 09/23/2013   HGBA1C 6.0* 03/27/2013    BNP (last 3 results) No results found for this basename: PROBNP,  in the last 8760 hours  Myoview Impression from January 2015  Exercise Capacity: Lexiscan with low level exercise.  BP Response: Normal blood pressure response.  Clinical Symptoms: No chest pain.  ECG Impression: No significant ST segment change suggestive of ischemia.  Comparison with Prior Nuclear Study: No significant change from previous scan of 2012.  Overall Impression: Large scar in the inferior, inferolateral and anterolateral distribution;  cannot exclude some soft tissue attenuation. No evidence for significant ischemia. No significant change from [place] of 2012.  LV Ejection Fraction: Study not gated. LV Wall Motion: Study not gated  Dorris Carnes    Echo Study Conclusions from May 2014 - Left ventricle: Systolic function was severely reduced. The estimated ejection fraction was in the range of 25% to 30%. - Aortic valve: No evidence of vegetation. Trivial regurgitation. - Mitral valve: Mild to moderate regurgitation. - Left atrium: The atrium was dilated. No evidence of thrombus in the atrial cavity or appendage. - Tricuspid valve: No evidence of vegetation. - Pulmonic valve: No evidence of vegetation.  Assessment / Plan: 1. Cardiomyopathy - with progressive shortness of breath - but probably multifactorial with his COPD, amiodarone and beta blocker therapy.   2. Underlying ICD/VT - sees Dr. [name] in one month  3. PAF  4. High risk medication use with Xarelto and amiodarone  5. COPD - on home oxygen at night only.   6. Worsening shortness of breath -  He wants to cut back his Coreg - will discontinue the 3.125 mg BID dose and leave him on his 12.5 mg BID dose. Needs PFTs with diffusion. Will get him back to see Dr. Joya Gaskins for discussion. May need to address the continued use of amiodarone. Will have him see Dr. [name] as planned.   7. Hypothyroidism - most likely from amiodarone as well -  Followed by Dr. Maudie Mercury  Patient is agreeable to  this plan and will call if any problems develop in the interim.   Burtis Junes, RN, [facility] [address] [place], [place]  [address] [phone]

## 2014-06-26 NOTE — Patient Instructions (Addendum)
Stay on your current medicines but I am stopping the 3.125 mg of Coreg  We will arrange for a breathing test at Dr. Bettina Gavia office  We will arrange for a follow up visit with Dr. Joya Gaskins for discussion  Keep your appointment with Dr. [name]  Call the [place] office at [phone] if you have any questions, problems or concerns.

## 2014-07-05 ENCOUNTER — Ambulatory Visit (INDEPENDENT_AMBULATORY_CARE_PROVIDER_SITE_OTHER): Payer: Managed Care, Other (non HMO) | Admitting: Critical Care Medicine

## 2014-07-05 DIAGNOSIS — I5022 Chronic systolic (congestive) heart failure: Secondary | ICD-10-CM

## 2014-07-05 DIAGNOSIS — I472 Ventricular tachycardia, unspecified: Secondary | ICD-10-CM

## 2014-07-05 DIAGNOSIS — I4729 Other ventricular tachycardia: Secondary | ICD-10-CM | POA: Diagnosis not present

## 2014-07-05 DIAGNOSIS — R0602 Shortness of breath: Secondary | ICD-10-CM

## 2014-07-05 LAB — PULMONARY FUNCTION TEST
DL/VA % pred: 41 %
DL/VA: 1.96 ml/min/mmHg/L
DLCO unc % pred: 31 %
DLCO unc: 11.14 ml/min/mmHg
FEF 25-75 Post: 0.77 L/sec
FEF 25-75 Pre: 0.67 L/sec
FEF2575-%Change-Post: 14 %
FEF2575-%Pred-Post: 31 %
FEF2575-%Pred-Pre: 27 %
FEV1-%Change-Post: 4 %
FEV1-%Pred-Post: 55 %
FEV1-%Pred-Pre: 52 %
FEV1-Post: 1.85 L
FEV1-Pre: 1.78 L
FEV1FVC-%Change-Post: 3 %
FEV1FVC-%Pred-Pre: 77 %
FEV6-%Change-Post: 1 %
FEV6-%Pred-Post: 69 %
FEV6-%Pred-Pre: 68 %
FEV6-Post: 3.01 L
FEV6-Pre: 2.96 L
FEV6FVC-%Change-Post: 0 %
FEV6FVC-%Pred-Post: 101 %
FEV6FVC-%Pred-Pre: 100 %
FVC-%Change-Post: 0 %
FVC-%Pred-Post: 68 %
FVC-%Pred-Pre: 67 %
FVC-Post: 3.15 L
FVC-Pre: 3.13 L
Post FEV1/FVC ratio: 59 %
Post FEV6/FVC ratio: 96 %
Pre FEV1/FVC ratio: 57 %
Pre FEV6/FVC Ratio: 95 %
RV % pred: 112 %
RV: 2.97 L
TLC % pred: 86 %
TLC: 6.4 L

## 2014-07-05 NOTE — Progress Notes (Signed)
PFT done today. 

## 2014-07-09 DIAGNOSIS — M545 Low back pain, unspecified: Secondary | ICD-10-CM | POA: Diagnosis not present

## 2014-07-09 DIAGNOSIS — T148XXA Other injury of unspecified body region, initial encounter: Secondary | ICD-10-CM | POA: Diagnosis not present

## 2014-07-23 ENCOUNTER — Encounter: Payer: Self-pay | Admitting: Critical Care Medicine

## 2014-07-23 ENCOUNTER — Ambulatory Visit (INDEPENDENT_AMBULATORY_CARE_PROVIDER_SITE_OTHER): Payer: Managed Care, Other (non HMO) | Admitting: Critical Care Medicine

## 2014-07-23 VITALS — BP 120/70 | HR 61 | Temp 97.0°F | Ht 72.0 in | Wt 214.8 lb

## 2014-07-23 DIAGNOSIS — J438 Other emphysema: Secondary | ICD-10-CM

## 2014-07-23 DIAGNOSIS — I2589 Other forms of chronic ischemic heart disease: Secondary | ICD-10-CM

## 2014-07-23 DIAGNOSIS — J439 Emphysema, unspecified: Secondary | ICD-10-CM

## 2014-07-23 DIAGNOSIS — Z23 Encounter for immunization: Secondary | ICD-10-CM | POA: Diagnosis not present

## 2014-07-23 MED ORDER — FLUTICASONE-SALMETEROL 250-50 MCG/DOSE IN AEPB: 1.0000 | INHALATION_SPRAY | Freq: Two times a day (BID) | RESPIRATORY_TRACT | Status: DC

## 2014-07-23 MED ORDER — ALBUTEROL SULFATE 108 (90 BASE) MCG/ACT IN AEPB: 2.0000 | INHALATION_SPRAY | Freq: Four times a day (QID) | RESPIRATORY_TRACT | Status: DC | PRN

## 2014-07-23 NOTE — Progress Notes (Signed)
Subjective:    Patient ID: Corey Tyler, male    DOB: 26-Mar-1941, 73 y.o.   MRN: [id number]  HPI  73 y.o.    WM Golds Stage C Copd   07/23/2014 Chief Complaint  Patient presents with  . Follow-up    PFT exam   No recent changes in dyspnea.  Still works everyday.  Pt walks 79mi per day and does well with this Recent pfts OK.   Review of Systems  Constitutional:   No  weight loss, night sweats,  Fevers, chills, fatigue, lassitude. HEENT:   No headaches,  Difficulty swallowing,  Tooth/dental problems,  Sore throat,                No sneezing, itching, ear ache, nasal congestion, post nasal drip,   CV:  No chest pain,  Orthopnea, PND, swelling in lower extremities, anasarca, dizziness, palpitations  GI  No heartburn, indigestion, abdominal pain, nausea, vomiting, diarrhea, change in bowel habits, loss of appetite  Resp: Notes  shortness of breath with exertion not  at rest.  No excess mucus, no productive cough,  Notes  non-productive cough,  No coughing up of blood.  No change in color of mucus.  No wheezing.  No chest wall deformity  Skin: no rash or lesions.  GU: no dysuria, change in color of urine, no urgency or frequency.  No flank pain.  MS:  No joint pain or swelling.  No decreased range of motion.  No back pain.  Psych:  No change in mood or affect. No depression or anxiety.  No memory loss.     Objective:   Physical Exam  Filed Vitals:   07/23/14 1044  BP: 120/70  Pulse: 61  Temp: 97 F (36.1 C)  Height: 6' (1.829 m)  Weight: 214 lb 12.8 oz (97.433 kg)  SpO2: 92%    Gen: Pleasant, well-nourished, in no distress,  normal affect  ENT: No lesions,  mouth clear,  oropharynx clear, no postnasal drip  Neck: No JVD, no TMG, no carotid bruits  Lungs: No use of accessory muscles, no dullness to percussion, distant BS  Cardiovascular: RRR, heart sounds normal, no murmur or gallops, no peripheral edema  Abdomen: soft and NT, no HSM,  BS  normal  Musculoskeletal: No deformities, no cyanosis or clubbing  Neuro: alert, non focal  Skin: Warm, no lesions or rashes       Assessment & Plan:   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 Gold C Copd, no desaturation with exertion Stable pfts compared to 07/2013 Plan Flu vaccine and prevnar 13 given Proair as needed inhaler sent to pharmacy Stay on advair Return 6 months    Updated Medication List Outpatient Encounter Prescriptions as of 07/23/2014  Medication Sig  . albuterol (PROVENTIL) (2.5 MG/3ML) 0.083% nebulizer solution Take 3 mLs (2.5 mg total) by nebulization every 6 (six) hours as needed for wheezing or shortness of breath.  Marland Kitchen amiodarone (PACERONE) 200 MG tablet Take 1 tablet (200 mg total) by mouth daily.  Marland Kitchen apixaban (ELIQUIS) 5 MG TABS tablet TAKE 1 TABLET BY MOUTH TWICE A DAY  . carvedilol (COREG) 12.5 MG tablet Take 1 tablet (12.5 mg total) by mouth 2 (two) times daily with a meal.  . cholecalciferol (VITAMIN D) 1000 UNITS tablet Take 1,000 Units by mouth daily.  . finasteride (PROSCAR) 5 MG tablet Take 5 mg by mouth every morning.  . Fluticasone-Salmeterol (ADVAIR DISKUS) 250-50 MCG/DOSE AEPB Inhale 1 puff into the  lungs 2 (two) times daily.  . furosemide (LASIX) 40 MG tablet TAKE 1 TABLET BY MOUTH EVERY DAY  . levothyroxine (SYNTHROID, LEVOTHROID) 25 MCG tablet Take 25-37.5 mcg by mouth daily before breakfast. On weekends--1 and 1/2 on Saturday and Sunday per PCP  . losartan (COZAAR) 100 MG tablet   . Melatonin 5 MG TABS Take 10 mg by mouth at bedtime as needed (for sleep).  . nitroGLYCERIN (NITROSTAT) 0.4 MG SL tablet Place 0.4 mg under the tongue every 5 (five) minutes as needed. For chest pain  . NON FORMULARY Place 2 L into the nose at bedtime.  . Omega-3 Fatty Acids (FISH OIL) 1200 MG CAPS Take 1 capsule by mouth 2 (two) times daily.    . pantoprazole (PROTONIX) 40 MG tablet Take 40 mg by mouth at bedtime.   . Pediatric  Multiple Vit-C-FA (MULTIVITAMIN ANIMAL SHAPES, WITH CA/FA,) WITH C & FA CHEW chewable tablet Chew 1 tablet by mouth daily.  . psyllium (METAMUCIL) 58.6 % powder Take 1 packet by mouth daily.   . rosuvastatin (CRESTOR) 5 MG tablet Take 5 mg by mouth at bedtime.   . Tamsulosin HCl (FLOMAX) 0.4 MG CAPS Take 0.4 mg by mouth at bedtime.   . vitamin B-12 (CYANOCOBALAMIN) 1000 MCG tablet Take 1,000 mcg by mouth daily.  . [DISCONTINUED] ADVAIR DISKUS 250-50 MCG/DOSE AEPB Inhale 1 puff into the lungs 2 (two) times daily.   . Albuterol Sulfate (PROAIR RESPICLICK) 244 (90 BASE) MCG/ACT AEPB Inhale 2 puffs into the lungs every 6 (six) hours as needed.  . [DISCONTINUED] Fluticasone Furoate-Vilanterol (BREO ELLIPTA) 100-25 MCG/INH AEPB Inhale 1 puff into the lungs daily.  . [DISCONTINUED] lisinopril (PRINIVIL,ZESTRIL) 40 MG tablet Take 40 mg by mouth daily.

## 2014-07-23 NOTE — Assessment & Plan Note (Signed)
Gold C Copd, no desaturation with exertion Stable pfts compared to 07/2013 Plan Flu vaccine and prevnar 13 given Proair as needed inhaler sent to pharmacy Stay on advair Return 6 months

## 2014-07-23 NOTE — Patient Instructions (Signed)
Flu vaccine and prevnar 13 given Proair as needed inhaler sent to pharmacy Stay on advair Return 6 months

## 2014-07-24 ENCOUNTER — Encounter: Payer: Managed Care, Other (non HMO) | Admitting: Internal Medicine

## 2014-07-25 ENCOUNTER — Telehealth: Payer: Self-pay | Admitting: *Deleted

## 2014-07-25 NOTE — Telephone Encounter (Signed)
ID # [id number] [phone] Called to start PA. This was approved x 6 months Called [facility] aware of approval. Nothing further needed

## 2014-08-07 ENCOUNTER — Encounter: Payer: Self-pay | Admitting: Internal Medicine

## 2014-08-16 DIAGNOSIS — R972 Elevated prostate specific antigen [PSA]: Secondary | ICD-10-CM | POA: Diagnosis not present

## 2014-08-16 DIAGNOSIS — I10 Essential (primary) hypertension: Secondary | ICD-10-CM | POA: Diagnosis not present

## 2014-08-29 DIAGNOSIS — I10 Essential (primary) hypertension: Secondary | ICD-10-CM | POA: Diagnosis not present

## 2014-09-17 ENCOUNTER — Ambulatory Visit (INDEPENDENT_AMBULATORY_CARE_PROVIDER_SITE_OTHER): Payer: Managed Care, Other (non HMO) | Admitting: *Deleted

## 2014-09-17 ENCOUNTER — Encounter: Payer: Self-pay | Admitting: Internal Medicine

## 2014-09-17 DIAGNOSIS — I429 Cardiomyopathy, unspecified: Secondary | ICD-10-CM

## 2014-09-17 DIAGNOSIS — I5022 Chronic systolic (congestive) heart failure: Secondary | ICD-10-CM

## 2014-09-17 DIAGNOSIS — I4729 Other ventricular tachycardia: Secondary | ICD-10-CM

## 2014-09-17 DIAGNOSIS — I472 Ventricular tachycardia: Secondary | ICD-10-CM

## 2014-09-17 DIAGNOSIS — I428 Other cardiomyopathies: Secondary | ICD-10-CM

## 2014-09-17 DIAGNOSIS — I480 Paroxysmal atrial fibrillation: Secondary | ICD-10-CM

## 2014-09-17 LAB — MDC_IDC_ENUM_SESS_TYPE_INCLINIC
Battery Remaining Longevity: 69.6 mo
Brady Statistic RA Percent Paced: 54 %
Brady Statistic RV Percent Paced: 0.35 %
Date Time Interrogation Session: 20151026172612
HighPow Impedance: 81 Ohm
Implantable Pulse Generator Serial Number: 7003419
Lead Channel Impedance Value: 362.5 Ohm
Lead Channel Pacing Threshold Amplitude: 0.5 V
Lead Channel Pacing Threshold Amplitude: 0.5 V
Lead Channel Pacing Threshold Amplitude: 1 V
Lead Channel Pacing Threshold Pulse Width: 0.5 ms
Lead Channel Pacing Threshold Pulse Width: 0.5 ms
Lead Channel Pacing Threshold Pulse Width: 0.6 ms
Lead Channel Sensing Intrinsic Amplitude: 3.1 mV
Lead Channel Setting Pacing Amplitude: 2.5 V
Lead Channel Setting Pacing Pulse Width: 0.6 ms
Lead Channel Setting Sensing Sensitivity: 0.5 mV
MDC IDC MSMT LEADCHNL RA IMPEDANCE VALUE: 475 Ohm
MDC IDC MSMT LEADCHNL RV PACING THRESHOLD AMPLITUDE: 1 V
MDC IDC MSMT LEADCHNL RV PACING THRESHOLD PULSEWIDTH: 0.6 ms
MDC IDC MSMT LEADCHNL RV SENSING INTR AMPL: 12 mV
MDC IDC SET LEADCHNL RA PACING AMPLITUDE: 2 V
MDC IDC SET ZONE DETECTION INTERVAL: 250 ms
MDC IDC SET ZONE DETECTION INTERVAL: 300 ms

## 2014-09-17 NOTE — Progress Notes (Signed)
ICD check in clinic. Normal device function. Thresholds and sensing consistent with previous device measurements. Impedance trends stable over time. No evidence of any ventricular arrhythmias. 1 mode switch x 4 sec @ 162/72---AT. Stable thoracic impedance. Histogram distribution appropriate for patient and level of activity. No changes made this session. Device programmed at appropriate safety margins. Device programmed to optimize intrinsic conduction. Estimated longevity 4.9-5.8 years. Pt enrolled in remote follow-up. Plan to follow up with SK in 3 months.

## 2014-09-21 ENCOUNTER — Encounter: Payer: Managed Care, Other (non HMO) | Admitting: Internal Medicine

## 2014-10-26 DIAGNOSIS — H2513 Age-related nuclear cataract, bilateral: Secondary | ICD-10-CM | POA: Diagnosis not present

## 2014-10-28 ENCOUNTER — Other Ambulatory Visit: Payer: Self-pay | Admitting: Internal Medicine

## 2014-11-01 ENCOUNTER — Encounter (HOSPITAL_COMMUNITY): Payer: Self-pay | Admitting: Internal Medicine

## 2014-12-10 ENCOUNTER — Ambulatory Visit (INDEPENDENT_AMBULATORY_CARE_PROVIDER_SITE_OTHER): Payer: Managed Care, Other (non HMO) | Admitting: Family Medicine

## 2014-12-10 VITALS — BP 120/60 | HR 72 | Temp 98.1°F | Resp 16 | Ht 73.0 in | Wt 214.0 lb

## 2014-12-10 DIAGNOSIS — J029 Acute pharyngitis, unspecified: Secondary | ICD-10-CM

## 2014-12-10 DIAGNOSIS — I10 Essential (primary) hypertension: Secondary | ICD-10-CM | POA: Diagnosis not present

## 2014-12-10 DIAGNOSIS — E039 Hypothyroidism, unspecified: Secondary | ICD-10-CM | POA: Diagnosis not present

## 2014-12-10 DIAGNOSIS — R972 Elevated prostate specific antigen [PSA]: Secondary | ICD-10-CM | POA: Diagnosis not present

## 2014-12-10 DIAGNOSIS — R739 Hyperglycemia, unspecified: Secondary | ICD-10-CM | POA: Diagnosis not present

## 2014-12-10 LAB — POCT RAPID STREP A (OFFICE): Rapid Strep A Screen: NEGATIVE

## 2014-12-10 MED ORDER — AMOXICILLIN 875 MG PO TABS: 875.0000 mg | ORAL_TABLET | Freq: Two times a day (BID) | ORAL | Status: DC

## 2014-12-10 NOTE — Progress Notes (Signed)
This chart was scribed for Caroleen Hamman, MD by Einar Pheasant, ED Scribe. This patient was seen in room 14 and the patient's care was started at 4:24 PM.  Patient ID: Corey Tyler MRN: [id number], DOB: 1941-11-17, 74 y.o. Date of Encounter: 12/10/2014, 4:20 PM  Primary Physician: [name], MD  Chief Complaint:  Chief Complaint  Patient presents with  . Sore Throat    x 3 days     HPI: 74 y.o. year old male who wrks 40hr/wk at home [employer] with history below presents with gradual onset persistent sore throat that started approximately 3 days ago. Pt states that he is concerned that he may have strep throat. He is unsure of a fever. Current office temperature is 98.1. Pt also complaining of some discomfort in bilateral ears. He denies any fever, chills, nausea, emesis, abdominal pain, chest pain, diarrhea, congestion, SOB, or wheezing.    Past Medical History  Diagnosis Date  . Old myocardial infarction     a. ?Silent MI. b. Large fixed inferolateral defect c/w with prior infarct, no ischemia. c. Cath in 2000/2013 with only minimal CAD.  Marland Kitchen Hypertension   . COPD (chronic obstructive pulmonary disease)   . Hyperlipidemia   . Chronic systolic heart failure     EF down to 20 to 25% per echo November 2012  . NICM (nonischemic cardiomyopathy)     a. Normal cors 2000. b. Minimal plaque 2013.  . Pulmonary nodule, right     Last scan in 2010 showing stability; felt to be benign.  . Cardiac defibrillator -dual  [facility]   . Atrial tachycardia-non sustained     a. Noted on 03/2012 interrogation.  Marland Kitchen A-fib     a. Noted on 12/2012 interrogation, placed on Apixaban and ultimately stopped NOACs 2/2 personal decision 8/14.  Marland Kitchen PVC's (premature ventricular contractions)   . ICD (implantable cardiac defibrillator) in place   . Ventricular tachycardia, polymorphic     Rx via ICD 12/14  . Arthritis      Home Meds: Prior to Admission medications   Medication Sig Start Date End Date Taking?  Authorizing Provider  albuterol (PROVENTIL) (2.5 MG/3ML) 0.083% nebulizer solution Take 3 mLs (2.5 mg total) by nebulization every 6 (six) hours as needed for wheezing or shortness of breath. 03/18/14  Yes Chelle S Jeffery, PA-C  Albuterol Sulfate (PROAIR RESPICLICK) 300 (90 BASE) MCG/ACT AEPB Inhale 2 puffs into the lungs every 6 (six) hours as needed. 07/23/14  Yes Elsie Stain, MD  amiodarone (PACERONE) 200 MG tablet Take 1 tablet (200 mg total) by mouth daily. 05/08/14  Yes Minus Breeding, MD  apixaban (ELIQUIS) 5 MG TABS tablet TAKE 1 TABLET BY MOUTH TWICE A DAY 04/20/14  Yes Minus Breeding, MD  carvedilol (COREG) 12.5 MG tablet TAKE 1 TABLET (12.5 MG TOTAL) BY MOUTH 2 (TWO) TIMES DAILY WITH A MEAL. 10/29/14  Yes Deboraha Sprang, MD  cholecalciferol (VITAMIN D) 1000 UNITS tablet Take 1,000 Units by mouth daily.   Yes Historical Provider, MD  finasteride (PROSCAR) 5 MG tablet Take 5 mg by mouth every morning.   Yes Historical Provider, MD  Fluticasone-Salmeterol (ADVAIR DISKUS) 250-50 MCG/DOSE AEPB Inhale 1 puff into the lungs 2 (two) times daily. 07/23/14  Yes Elsie Stain, MD  furosemide (LASIX) 40 MG tablet TAKE 1 TABLET BY MOUTH EVERY DAY 06/21/14  Yes Minus Breeding, MD  levothyroxine (SYNTHROID, LEVOTHROID) 25 MCG tablet Take 25-37.5 mcg by mouth daily before breakfast. On weekends--1 and  1/2 on Saturday and Sunday per PCP   Yes Historical Provider, MD  losartan (COZAAR) 100 MG tablet  05/26/14  Yes Historical Provider, MD  Melatonin 5 MG TABS Take 10 mg by mouth at bedtime as needed (for sleep).   Yes Historical Provider, MD  nitroGLYCERIN (NITROSTAT) 0.4 MG SL tablet Place 0.4 mg under the tongue every 5 (five) minutes as needed. For chest pain 11/12/11  Yes Burtis Junes, NP  NON FORMULARY Place 2 L into the nose at bedtime.   Yes Historical Provider, MD  Omega-3 Fatty Acids (FISH OIL) 1200 MG CAPS Take 1 capsule by mouth 2 (two) times daily.     Yes Historical Provider, MD  pantoprazole  (PROTONIX) 40 MG tablet Take 40 mg by mouth at bedtime.    Yes Historical Provider, MD  Pediatric Multiple Vit-C-FA (MULTIVITAMIN ANIMAL SHAPES, WITH CA/FA,) WITH C & FA CHEW chewable tablet Chew 1 tablet by mouth daily.   Yes Historical Provider, MD  psyllium (METAMUCIL) 58.6 % powder Take 1 packet by mouth daily.    Yes Historical Provider, MD  rosuvastatin (CRESTOR) 5 MG tablet Take 5 mg by mouth at bedtime.    Yes Historical Provider, MD  Tamsulosin HCl (FLOMAX) 0.4 MG CAPS Take 0.4 mg by mouth at bedtime.    Yes Historical Provider, MD  vitamin B-12 (CYANOCOBALAMIN) 1000 MCG tablet Take 1,000 mcg by mouth daily.   Yes Historical Provider, MD    Allergies:  Allergies  Allergen Reactions  . Xarelto [Rivaroxaban] Other (See Comments)    Bleeding  . Adhesive [Tape] Hives, Itching and Rash  . Atorvastatin Other (See Comments)    cough    History   Social History  . Marital Status: Married    Spouse Name: N/A    Number of Children: 2  . Years of Education: N/A   Occupational History  . Retired     Press photographer  . Works [place] History Main Topics  . Smoking status: Former Smoker -- 2.50 packs/day for 40 years    Types: Cigarettes    Quit date: 11/24/1983  . Smokeless tobacco: Never Used     Comment: 2 1/2 ppd x 40 years  . Alcohol Use: No  . Drug Use: No  . Sexual Activity: Not on file   Other Topics Concern  . Not on file   Social History Narrative   Still works at [employer] part-time   Lives with Wife in [place]: Positive sore throat Constitutional: negative for chills, fever, night sweats, weight changes, or fatigue  HEENT: negative for vision changes, hearing loss, congestion, rhinorrhea, ST, epistaxis, or sinus pressure Cardiovascular: negative for chest pain or palpitations Respiratory: negative for hemoptysis, wheezing, shortness of breath, or cough Abdominal: negative for abdominal pain, nausea, vomiting, diarrhea,  or constipation Dermatological: negative for rash Neurologic: negative for headache, dizziness, or syncope All other systems reviewed and are otherwise negative with the exception to those above and in the HPI.   Physical Exam: right throat is red and swollen. Normal TMs bilaterally. Mild adenopathy to right anterior neck.  Blood pressure 120/60, pulse 72, temperature 98.1 F (36.7 C), temperature source Oral, resp. rate 16, height 6\' 1"  (1.854 m), weight 214 lb (97.07 kg), SpO2 97 %., Body mass index is 28.24 kg/(m^2). General: Well developed, well nourished, in no acute distress. Head: Normocephalic, atraumatic, eyes without discharge, sclera non-icteric, nares are without discharge. Bilateral auditory canals  clear, TM's are without perforation, pearly grey and translucent with reflective cone of light bilaterally. Oral cavity moist, posterior pharynx without exudate.  Neck: Supple. No thyromegaly. Full ROM. No lymphadenopathy. Lungs: Clear bilaterally to auscultation without wheezes, rales, or rhonchi. Breathing is unlabored. Heart: RRR with S1 S2. No murmurs, rubs, or gallops appreciated. Abdomen: Soft, non-tender, non-distended with normoactive bowel sounds. No hepatomegaly. No rebound/guarding. No obvious abdominal masses. Msk:  Strength and tone normal for age. Extremities/Skin: Warm and dry. No clubbing or cyanosis. No edema. No rashes or suspicious lesions. Neuro: Alert and oriented X 3. Moves all extremities spontaneously. Gait is normal. CNII-XII grossly in tact. Psych:  Responds to questions appropriately with a normal affect.   Labs: Results for orders placed or performed in visit on 12/10/14  POCT rapid strep A  Result Value Ref Range   Rapid Strep A Screen Negative Negative      ASSESSMENT AND PLAN:  74 y.o. year old male with  This chart was scribed in my presence and reviewed by me personally.    ICD-9-CM ICD-10-CM   1. Acute pharyngitis, unspecified pharyngitis  type 462 J02.9 POCT rapid strep A     Throat culture ([facility])     amoxicillin (AMOXIL) 875 MG tablet     Culture, Group A Strep     Signed, Robyn Haber, MD    I personally performed the services described in this documentation, which was scribed in my presence. The recorded information has been reviewed and is accurate.  Signed, Robyn Haber, MD 12/10/2014 4:20 PM

## 2014-12-10 NOTE — Patient Instructions (Signed)

## 2014-12-12 ENCOUNTER — Encounter: Payer: Self-pay | Admitting: Family Medicine

## 2014-12-12 LAB — CULTURE, GROUP A STREP: Organism ID, Bacteria: NORMAL

## 2014-12-13 DIAGNOSIS — E785 Hyperlipidemia, unspecified: Secondary | ICD-10-CM | POA: Diagnosis not present

## 2014-12-13 DIAGNOSIS — E039 Hypothyroidism, unspecified: Secondary | ICD-10-CM | POA: Diagnosis not present

## 2014-12-13 DIAGNOSIS — R972 Elevated prostate specific antigen [PSA]: Secondary | ICD-10-CM | POA: Diagnosis not present

## 2014-12-13 DIAGNOSIS — I10 Essential (primary) hypertension: Secondary | ICD-10-CM | POA: Diagnosis not present

## 2014-12-16 ENCOUNTER — Other Ambulatory Visit: Payer: Self-pay | Admitting: Cardiology

## 2014-12-16 NOTE — Telephone Encounter (Signed)
Rx(s) sent to pharmacy electronically.  

## 2014-12-18 ENCOUNTER — Encounter: Payer: Self-pay | Admitting: Internal Medicine

## 2014-12-18 ENCOUNTER — Ambulatory Visit (INDEPENDENT_AMBULATORY_CARE_PROVIDER_SITE_OTHER): Payer: Managed Care, Other (non HMO) | Admitting: Internal Medicine

## 2014-12-18 VITALS — BP 110/60 | HR 70 | Ht 73.0 in | Wt 218.0 lb

## 2014-12-18 DIAGNOSIS — I255 Ischemic cardiomyopathy: Secondary | ICD-10-CM

## 2014-12-18 DIAGNOSIS — I5022 Chronic systolic (congestive) heart failure: Secondary | ICD-10-CM

## 2014-12-18 DIAGNOSIS — I4729 Other ventricular tachycardia: Secondary | ICD-10-CM

## 2014-12-18 DIAGNOSIS — Z95810 Presence of automatic (implantable) cardiac defibrillator: Secondary | ICD-10-CM | POA: Diagnosis not present

## 2014-12-18 DIAGNOSIS — Z4502 Encounter for adjustment and management of automatic implantable cardiac defibrillator: Secondary | ICD-10-CM

## 2014-12-18 DIAGNOSIS — I472 Ventricular tachycardia: Secondary | ICD-10-CM | POA: Diagnosis not present

## 2014-12-18 DIAGNOSIS — I480 Paroxysmal atrial fibrillation: Secondary | ICD-10-CM

## 2014-12-18 LAB — MDC_IDC_ENUM_SESS_TYPE_INCLINIC
Brady Statistic RA Percent Paced: 57 %
Brady Statistic RV Percent Paced: 0.57 %
Date Time Interrogation Session: 20160126142515
HighPow Impedance: 74.25 Ohm
Implantable Pulse Generator Serial Number: 7003419
Lead Channel Impedance Value: 362.5 Ohm
Lead Channel Pacing Threshold Amplitude: 0.5 V
Lead Channel Pacing Threshold Amplitude: 0.5 V
Lead Channel Pacing Threshold Amplitude: 1 V
Lead Channel Pacing Threshold Amplitude: 1 V
Lead Channel Pacing Threshold Pulse Width: 0.5 ms
Lead Channel Pacing Threshold Pulse Width: 0.5 ms
Lead Channel Pacing Threshold Pulse Width: 0.6 ms
Lead Channel Sensing Intrinsic Amplitude: 12 mV
Lead Channel Sensing Intrinsic Amplitude: 3.3 mV
Lead Channel Setting Pacing Amplitude: 1.5 V
Lead Channel Setting Pacing Pulse Width: 0.6 ms
Lead Channel Setting Sensing Sensitivity: 0.5 mV
MDC IDC MSMT BATTERY REMAINING LONGEVITY: 68.4 mo
MDC IDC MSMT LEADCHNL RA IMPEDANCE VALUE: 425 Ohm
MDC IDC MSMT LEADCHNL RV PACING THRESHOLD PULSEWIDTH: 0.6 ms
MDC IDC SET LEADCHNL RV PACING AMPLITUDE: 2.5 V
Zone Setting Detection Interval: 250 ms
Zone Setting Detection Interval: 300 ms

## 2014-12-18 NOTE — Patient Instructions (Addendum)
Your physician recommends that you continue on your current medications as directed. Please refer to the Current Medication list given to you today.  Remote monitoring is used to monitor your Pacemaker of ICD from home. This monitoring reduces the number of office visits required to check your device to one time per year. It allows [place] to keep an eye on the functioning of your device to ensure it is working properly. You are scheduled for a device check from home on 03/19/15. You may send your transmission at any time that day. If you have a wireless device, the transmission will be sent automatically. After your physician reviews your transmission, you will receive a postcard with your next transmission date.  Your physician wants you to follow-up in: 6 months with Corey Merle, NP.  You will receive a reminder letter in the mail two months in advance. If you don't receive a letter, please call our office to schedule the follow-up appointment.   Your physician wants you to follow-up in: 1 year with Corey Tyler.  You will receive a reminder letter in the mail two months in advance. If you don't receive a letter, please call our office to schedule the follow-up appointment.

## 2014-12-18 NOTE — Progress Notes (Signed)
Patient Care Team: [name], MD as PCP - General (Internal Medicine) Elsie Stain, MD (Pulmonary Disease)   HPI  Corey Tyler is a 74 y.o. male Seen in followup for ICD implantation 2/13 for primary prevention. This occurred in the context of ischemic/nonischemic cardiac myopathy with underlying bradycardia.    December 2014 he had appropriate ICD discharge for polymorphic ventricular tachycardia.  Subsequent Myoview scan was not gated; it demonstrated a large inferolateral and anterolateral scar without ischemia.  His Coreg was up titrated.  Echo from 2008 demonstrating an EF of 40-45% with inferior/posterior hypokinesis. Cardiac catheterization from 2000 demonstrating normal coronary arteries but he did have a mildly reduced ejection fraction with wall motion abnormality.    Followup cath demonstrated only minimal coronary plaque. Echocardiogram 1/14 demonstrated progressiveeft ventricular dysfunction with an EF of 15% and wall motion abnormalities in the inferior wall  He also has a history of atrial fibrillation. In the past he was treated with Rivaroxaban which was complicated by GI bleeding. He was then treated with apixaban. He is also been treated with amiodarone. Last available TSH was 5/15 at 8.6. He has treated hypothyroidism. Liver panel was normal  this apparently has been addressed by his PCP who has increased his thyroid replacement. Repeat labs were done last month and apparently were normal  The patient denies chest pain, shortness of breath, nocturnal dyspnea, orthopnea or peripheral edema.  There have been no palpitations, lightheadedness or syncope.   His other complaint is a having problems sleeping.  Past Medical History  Diagnosis Date  . Old myocardial infarction     a. ?Silent MI. b. Large fixed inferolateral defect c/w with prior infarct, no ischemia. c. Cath in 2000/2013 with only minimal CAD.  Marland Kitchen Hypertension   . COPD (chronic obstructive  pulmonary disease)   . Hyperlipidemia   . Chronic systolic heart failure     EF down to 20 to 25% per echo November 2012  . NICM (nonischemic cardiomyopathy)     a. Normal cors 2000. b. Minimal plaque 2013.  . Pulmonary nodule, right     Last scan in 2010 showing stability; felt to be benign.  . Cardiac defibrillator -dual  [employer]   . Atrial tachycardia-non sustained     a. Noted on 03/2012 interrogation.  Marland Kitchen A-fib     a. Noted on 12/2012 interrogation, placed on Apixaban and ultimately stopped NOACs 2/2 personal decision 8/14.  Marland Kitchen PVC's (premature ventricular contractions)   . ICD (implantable cardiac defibrillator) in place   . Ventricular tachycardia, polymorphic     Rx via ICD 12/14  . Arthritis     Past Surgical History  Procedure Laterality Date  . Colon surgery    . Cardiac catheterization  2000  . Icd  12/2011    [name]  . Cardiac defibrillator placement    . Appendectomy    . Tee without cardioversion N/A 03/30/2013    Procedure: TRANSESOPHAGEAL ECHOCARDIOGRAM (TEE);  Surgeon: Thayer Headings, MD;  Location: [place];  Service: Cardiovascular;  Laterality: N/A;  . Cardioversion  03/30/2013    Procedure: CARDIOVERSION;  Surgeon: Thayer Headings, MD;  Location: [place];  Service: Cardiovascular;;  . Colonoscopy N/A 09/25/2013    Procedure: COLONOSCOPY;  Surgeon: Jerene Bears, MD;  Location: [facility];  Service: Endoscopy;  Laterality: N/A;  . Implantable cardioverter defibrillator implant N/A 01/06/2012    Procedure: IMPLANTABLE CARDIOVERTER DEFIBRILLATOR IMPLANT;  Surgeon: Deboraha Sprang, MD;  Location: [place] C[facility];  Service: Cardiovascular;  Laterality: N/A;    Current Outpatient Prescriptions  Medication Sig Dispense Refill  . albuterol (PROVENTIL) (2.5 MG/3ML) 0.083% nebulizer solution Take 3 mLs (2.5 mg total) by nebulization every 6 (six) hours as needed for wheezing or shortness of breath. 150 mL 1  . Albuterol Sulfate (PROAIR RESPICLICK) 716 (90 BASE) MCG/ACT  AEPB Inhale 2 puffs into the lungs every 6 (six) hours as needed. (Patient taking differently: Inhale 2 puffs into the lungs every 6 (six) hours as needed (for wheezing). ) 1 each 4  . amiodarone (PACERONE) 200 MG tablet Take 1 tablet (200 mg total) by mouth daily. 90 tablet 3  . apixaban (ELIQUIS) 5 MG TABS tablet TAKE 1 TABLET BY MOUTH TWICE A DAY 180 tablet 3  . carvedilol (COREG) 12.5 MG tablet TAKE 1 TABLET (12.5 MG TOTAL) BY MOUTH 2 (TWO) TIMES DAILY WITH A MEAL. 60 tablet 6  . cholecalciferol (VITAMIN D) 1000 UNITS tablet Take 1,000 Units by mouth daily.    . finasteride (PROSCAR) 5 MG tablet Take 5 mg by mouth every morning.    . Fluticasone-Salmeterol (ADVAIR DISKUS) 250-50 MCG/DOSE AEPB Inhale 1 puff into the lungs 2 (two) times daily. 60 each 11  . furosemide (LASIX) 40 MG tablet Take 1 tablet (40 mg total) by mouth daily. <MUST MAKE APPOINTMENT FOR REFILLS> 90 tablet 0  . levothyroxine (SYNTHROID, LEVOTHROID) 100 MCG tablet Take 100 mcg by mouth daily.    Marland Kitchen losartan (COZAAR) 100 MG tablet Take 100 mg by mouth daily.     . Melatonin 5 MG TABS Take 10 mg by mouth at bedtime as needed (for sleep).    . nitroGLYCERIN (NITROSTAT) 0.4 MG SL tablet Place 0.4 mg under the tongue every 5 (five) minutes as needed. For chest pain    . NON FORMULARY Place 2 L into the nose at bedtime.    . Omega-3 Fatty Acids (FISH OIL) 1200 MG CAPS Take 1 capsule by mouth 2 (two) times daily.      . pantoprazole (PROTONIX) 40 MG tablet Take 40 mg by mouth at bedtime.     . Pediatric Multiple Vit-C-FA (MULTIVITAMIN ANIMAL SHAPES, WITH CA/FA,) WITH C & FA CHEW chewable tablet Chew 1 tablet by mouth daily.    . psyllium (METAMUCIL) 58.6 % powder Take 1 packet by mouth daily.     . rosuvastatin (CRESTOR) 5 MG tablet Take 5 mg by mouth at bedtime.     . Tamsulosin HCl (FLOMAX) 0.4 MG CAPS Take 0.4 mg by mouth at bedtime.     . vitamin B-12 (CYANOCOBALAMIN) 1000 MCG tablet Take 1,000 mcg by mouth daily.     No  current facility-administered medications for this visit.    Allergies  Allergen Reactions  . Xarelto [Rivaroxaban] Other (See Comments)    Bleeding  . Adhesive [Tape] Hives, Itching and Rash  . Atorvastatin Other (See Comments)    cough    Review of Systems negative except from HPI and PMH  Physical Exam BP 110/60 mmHg  Pulse 70  Ht 6\' 1"  (1.854 m)  Wt 218 lb (98.884 kg)  BMI 28.77 kg/m2 Well developed and nourished in no acute distress HENT normal Neck supple with JVP-flat Clear Regular rate and rhythm, no murmurs or gallops Abd-soft with active BS No Clubbing cyanosis edema Skin-warm and dry A & Oriented  Grossly normal sensory and motor function Device pocket well healed; without hematoma or erythema.  There is no tethering  ECG ordered today demonstrates sinus rhythm at 70 Intervals 18/11/44 Prior inferior wall MI Nonspecific T-wave changes  Assessment and  Plan  Ventricular tachycardia/fibrillation  Atrial fibrillation  High risk medication  COPD  Implantable defibrillator-[employer]onic  Ischemic/nonischemic cardiomyopathy  Congestive heart failure-chronic-systolic  The patient is tolerating his amiodarone without specific complaints, specifically nausea or cough. Dr. Joya Gaskins is also following this issue. Dr. Maudie Mercury has followed his laboratories were apparently normal. I will request these.  He has had no intercurrent ventricular tachycardia  He's had no interval atrial fibrillation.  He is euvolemic.  Without symptoms of ischemia

## 2014-12-24 ENCOUNTER — Telehealth: Payer: Self-pay | Admitting: Physician Assistant

## 2014-12-24 DIAGNOSIS — R079 Chest pain, unspecified: Secondary | ICD-10-CM | POA: Diagnosis not present

## 2014-12-24 NOTE — Telephone Encounter (Signed)
Received a call from Mr. Frederich Balding at [facility]. Mr. Hamad had been in there and had some issues recently with chest pain. He is currently asymptomatic. His ECG is abnormal at baseline and does not provide new information. He has not had palpitations and his defibrillator has not fired.  Discussed the situation. Mr. Delilah Shan feels that Mr. Lichtenwalner is appropriate for discharge home, with early evaluation in the office.  Therefore, we'll send a message to the office that he should be called first thing in the morning for an appointment. Also, his device should be interrogated. Mr. Frasier was told to call 911 and come to the emergency room immediately if he has recurrent symptoms, or his defibrillator fires. He is on Eliquis, and has been compliant with this, so was not given aspirin.  Plan: Contact the patient in a.m. and get his device interrogated with early evaluation in the office.  Rosaria Ferries, PA-C 12/24/2014 7:30 PM Beeper [phone]

## 2014-12-25 ENCOUNTER — Telehealth: Payer: Self-pay | Admitting: Cardiology

## 2014-12-25 ENCOUNTER — Encounter: Payer: Self-pay | Admitting: Internal Medicine

## 2014-12-25 ENCOUNTER — Ambulatory Visit (INDEPENDENT_AMBULATORY_CARE_PROVIDER_SITE_OTHER): Payer: Managed Care, Other (non HMO) | Admitting: Cardiology

## 2014-12-25 ENCOUNTER — Encounter: Payer: Self-pay | Admitting: Cardiology

## 2014-12-25 VITALS — BP 140/80 | HR 62 | Ht 73.0 in | Wt 214.5 lb

## 2014-12-25 DIAGNOSIS — R0602 Shortness of breath: Secondary | ICD-10-CM | POA: Diagnosis not present

## 2014-12-25 DIAGNOSIS — I255 Ischemic cardiomyopathy: Secondary | ICD-10-CM

## 2014-12-25 NOTE — Progress Notes (Signed)
HPI The patient presents for follow up of acute on chronic systolic heart failure and atrial fibrillation.  He has had GI bleeding last fall on Xarelto but is tolerating Eliquis.  He had  a TSH of 17.57 probably related to amiodarone and has been treated with synthroid.  He had VTach in Dec 2014  Requiring cardioversion from his device. He had a followup perfusion study that demonstrated a previous inferior large fixed defect but no ischemia.     he was added to my schedule today because he was having some increasing dyspnea that he says he thinks is somewhat subacute. He's had left arm pain. He's had a little bit discomfort under his left axilla. He saw his primary provider and was not noted to have any acute EKG changes.   The patient says that today his discomfort is improved. It happened yesterday and the day before. He said it was somewhat sharp. He felt like he pulled something as he was doing some stretching. He says it does still hurt a little bit to move his arm in a certain direction. He's not describing any substernal chest discomfort, neck or arm discomfort. He's not describing any PND or orthopnea. He has had no weight gain or edema.  Allergies  Allergen Reactions  . Xarelto [Rivaroxaban] Other (See Comments)    Bleeding  . Adhesive [Tape] Hives, Itching and Rash  . Atorvastatin Other (See Comments)    cough    Current Outpatient Prescriptions  Medication Sig Dispense Refill  . albuterol (PROVENTIL) (2.5 MG/3ML) 0.083% nebulizer solution Take 3 mLs (2.5 mg total) by nebulization every 6 (six) hours as needed for wheezing or shortness of breath. 150 mL 1  . Albuterol Sulfate (PROAIR RESPICLICK) 381 (90 BASE) MCG/ACT AEPB Inhale 2 puffs into the lungs every 6 (six) hours as needed. (Patient taking differently: Inhale 2 puffs into the lungs every 6 (six) hours as needed (for wheezing). ) 1 each 4  . amiodarone (PACERONE) 200 MG tablet Take 1 tablet (200 mg total) by mouth daily. 90  tablet 3  . apixaban (ELIQUIS) 5 MG TABS tablet TAKE 1 TABLET BY MOUTH TWICE A DAY 180 tablet 3  . carvedilol (COREG) 12.5 MG tablet TAKE 1 TABLET (12.5 MG TOTAL) BY MOUTH 2 (TWO) TIMES DAILY WITH A MEAL. 60 tablet 6  . cholecalciferol (VITAMIN D) 1000 UNITS tablet Take 1,000 Units by mouth daily.    . finasteride (PROSCAR) 5 MG tablet Take 5 mg by mouth every morning.    . Fluticasone-Salmeterol (ADVAIR DISKUS) 250-50 MCG/DOSE AEPB Inhale 1 puff into the lungs 2 (two) times daily. 60 each 11  . furosemide (LASIX) 40 MG tablet Take 1 tablet (40 mg total) by mouth daily. <MUST MAKE APPOINTMENT FOR REFILLS> 90 tablet 0  . levothyroxine (SYNTHROID, LEVOTHROID) 100 MCG tablet Take 100 mcg by mouth daily.    Marland Kitchen losartan (COZAAR) 100 MG tablet Take 100 mg by mouth daily.     . Melatonin 5 MG TABS Take 10 mg by mouth at bedtime as needed (for sleep).    . nitroGLYCERIN (NITROSTAT) 0.4 MG SL tablet Place 0.4 mg under the tongue every 5 (five) minutes as needed. For chest pain    . NON FORMULARY Place 2 L into the nose at bedtime.    . Omega-3 Fatty Acids (FISH OIL) 1200 MG CAPS Take 1 capsule by mouth 2 (two) times daily.      . pantoprazole (PROTONIX) 40 MG tablet Take 40 mg  by mouth at bedtime.     . Pediatric Multiple Vit-C-FA (MULTIVITAMIN ANIMAL SHAPES, WITH CA/FA,) WITH C & FA CHEW chewable tablet Chew 1 tablet by mouth daily.    . psyllium (METAMUCIL) 58.6 % powder Take 1 packet by mouth daily.     . rosuvastatin (CRESTOR) 5 MG tablet Take 5 mg by mouth at bedtime.     . Tamsulosin HCl (FLOMAX) 0.4 MG CAPS Take 0.4 mg by mouth at bedtime.     . vitamin B-12 (CYANOCOBALAMIN) 1000 MCG tablet Take 1,000 mcg by mouth daily.     No current facility-administered medications for this visit.    Past Medical History  Diagnosis Date  . Old myocardial infarction     a. ?Silent MI. b. Large fixed inferolateral defect c/w with prior infarct, no ischemia. c. Cath in 2000/2013 with only minimal CAD.  Marland Kitchen  Hypertension   . COPD (chronic obstructive pulmonary disease)   . Hyperlipidemia   . Chronic systolic heart failure     EF down to 20 to 25% per echo November 2012  . NICM (nonischemic cardiomyopathy)     a. Normal cors 2000. b. Minimal plaque 2013.  . Pulmonary nodule, right     Last scan in 2010 showing stability; felt to be benign.  . Cardiac defibrillator -dual  [employer]   . Atrial tachycardia-non sustained     a. Noted on 03/2012 interrogation.  Marland Kitchen A-fib     a. Noted on 12/2012 interrogation, placed on Apixaban and ultimately stopped NOACs 2/2 personal decision 8/14.  Marland Kitchen PVC's (premature ventricular contractions)   . ICD (implantable cardiac defibrillator) in place   . Ventricular tachycardia, polymorphic     Rx via ICD 12/14  . Arthritis     Past Surgical History  Procedure Laterality Date  . Colon surgery    . Cardiac catheterization  2000  . Icd  12/2011    [name]  . Cardiac defibrillator placement    . Appendectomy    . Tee without cardioversion N/A 03/30/2013    Procedure: TRANSESOPHAGEAL ECHOCARDIOGRAM (TEE);  Surgeon: Thayer Headings, MD;  Location: [place];  Service: Cardiovascular;  Laterality: N/A;  . Cardioversion  03/30/2013    Procedure: CARDIOVERSION;  Surgeon: Thayer Headings, MD;  Location: [place];  Service: Cardiovascular;;  . Colonoscopy N/A 09/25/2013    Procedure: COLONOSCOPY;  Surgeon: Jerene Bears, MD;  Location: [facility];  Service: Endoscopy;  Laterality: N/A;  . Implantable cardioverter defibrillator implant N/A 01/06/2012    Procedure: IMPLANTABLE CARDIOVERTER DEFIBRILLATOR IMPLANT;  Surgeon: Deboraha Sprang, MD;  Location: [facility] C[facility];  Service: Cardiovascular;  Laterality: N/A;    ROS:  As stated in the HPI and negative for all other systems.   PHYSICAL EXAM BP 140/80 mmHg  Pulse 62  Ht 6\' 1"  (1.854 m)  Wt 214 lb 8 oz (97.297 kg)  BMI 28.31 kg/m2 GENERAL:  Well appearing NECK:  No jugular venous distention but hepatojugular reflux  noted, waveform within normal limits, carotid upstroke brisk and symmetric, no bruits, no thyromegaly LUNGS:  Clear to auscultation bilaterally BACK:  No CVA tenderness CHEST:  Well healed ICD pocket.   HEART:  PMI not displaced or sustained,S1 and S2 within normal limits, no S3, no S4, no clicks, no rubs, no murmurs, regular ABD:  Flat, positive bowel sounds normal in frequency in pitch, no bruits, no rebound, no guarding, no midline pulsatile mass, no hepatomegaly, no splenomegaly EXT:  2 plus pulses throughout,  mild ankle edema, no cyanosis no clubbing  EKG:  Atrial paced rate 60, axis within normal limits, intervals within normal limits, no acute ST-T wave changes, nonspecific T-wave flattening,  Old inferior infarct..  12/24/14   ASSESSMENT AND PLAN   Ischemic cardiomyopathy - He is having some increased dyspnea but it's hard to tell whether this is primary pulmonary. I think he is euvolemic. I'm going to check a BNP level.   For now I will not change his therapy.  Atrial fibrillation - He will continue on amiodarone at the current dose.   I did review his labs there were done recently. The liver enzymes were fine. This TSH was mildly elevated. He's had his dose of Synthroid increased recently. He will continue with the meds as listed however. Some of the fatigue he is describing might be related to the thyroid and we might not see an improvement this soon after a dose change.  HYPERTENSION -  This is being managed in the context of treating his CHF.  CAD - The patient has no new sypmtoms.   He had only minimal plaque at the previous catheterization. I do not suspect that his current symptoms  Our related to ischemia. I'm not planning any further workup at this point.

## 2014-12-25 NOTE — Telephone Encounter (Signed)
Received records from [facility] (Dr [name]) for appointment with Dr Percival Spanish on 12/25/14.  Records given to [facility] Investment banker, corporate) for Dr Hochrein's schedule today.  lp

## 2014-12-25 NOTE — Patient Instructions (Signed)
Your physician recommends that you schedule a follow-up appointment in: [place] NP AT [place] physician recommends that you HAVE LAB WORK TODAY

## 2014-12-26 LAB — BRAIN NATRIURETIC PEPTIDE: Brain Natriuretic Peptide: 52.8 pg/mL (ref 0.0–100.0)

## 2015-01-14 ENCOUNTER — Encounter: Payer: Self-pay | Admitting: Critical Care Medicine

## 2015-01-14 ENCOUNTER — Ambulatory Visit (INDEPENDENT_AMBULATORY_CARE_PROVIDER_SITE_OTHER): Payer: Managed Care, Other (non HMO) | Admitting: Critical Care Medicine

## 2015-01-14 VITALS — BP 108/62 | HR 60 | Temp 98.1°F | Ht 73.0 in | Wt 213.4 lb

## 2015-01-14 DIAGNOSIS — I255 Ischemic cardiomyopathy: Secondary | ICD-10-CM

## 2015-01-14 DIAGNOSIS — J441 Chronic obstructive pulmonary disease with (acute) exacerbation: Secondary | ICD-10-CM | POA: Diagnosis not present

## 2015-01-14 DIAGNOSIS — J432 Centrilobular emphysema: Secondary | ICD-10-CM

## 2015-01-14 MED ORDER — FLUTICASONE-SALMETEROL 250-50 MCG/DOSE IN AEPB: 1.0000 | INHALATION_SPRAY | Freq: Two times a day (BID) | RESPIRATORY_TRACT | Status: DC

## 2015-01-14 MED ORDER — ALBUTEROL SULFATE (2.5 MG/3ML) 0.083% IN NEBU: 2.5000 mg | INHALATION_SOLUTION | Freq: Four times a day (QID) | RESPIRATORY_TRACT | Status: DC | PRN

## 2015-01-14 NOTE — Assessment & Plan Note (Signed)
Gold C copd, PFTs from 06/2014 stable , c/w emphysema/copd. Doubt amiodarone toxicity Plan Ok to cont amiodarone Cont advair and prn albuterol Only needs oxygen 2L qhs, no need for daytime oxygen

## 2015-01-14 NOTE — Patient Instructions (Signed)
No change in medications. Return in        6 months No need for daytime oxygen

## 2015-01-14 NOTE — Progress Notes (Signed)
Subjective:    Patient ID: Corey Tyler, male    DOB: December 22, 1940, 74 y.o.   MRN: [id number]  HPI 74 y.o.    WM Golds Stage C Copd    01/14/2015 Chief Complaint  Patient presents with  . Follow-up    breathing is doing well.  Cardiologist wanted him to see Pulmonologist, pt doesn't know why, he says he is feeling fine.  Lung wise doing well.  No real cough.  Still works daily, notes mild wheezing.  No mucus.  No chest pain.   No heart issues.  Dyspnea is at baseline.  Pt had some Left arm pain and chest pain, but cards said was ok.   Review of Systems Constitutional:   No  weight loss, night sweats,  Fevers, chills, fatigue, lassitude. HEENT:   No headaches,  Difficulty swallowing,  Tooth/dental problems,  Sore throat,                No sneezing, itching, ear ache, nasal congestion, post nasal drip,   CV:  No chest pain,  Orthopnea, PND, swelling in lower extremities, anasarca, dizziness, palpitations  GI  No heartburn, indigestion, abdominal pain, nausea, vomiting, diarrhea, change in bowel habits, loss of appetite  Resp: Notes  shortness of breath with exertion not  at rest.  No excess mucus, no productive cough,  Notes  non-productive cough,  No coughing up of blood.  No change in color of mucus.  No wheezing.  No chest wall deformity  Skin: no rash or lesions.  GU: no dysuria, change in color of urine, no urgency or frequency.  No flank pain.  MS:  No joint pain or swelling.  No decreased range of motion.  No back pain.  Psych:  No change in mood or affect. No depression or anxiety.  No memory loss.     Objective:   Physical Exam Filed Vitals:   01/14/15 1111  BP: 108/62  Pulse: 60  Temp: 98.1 F (36.7 C)  TempSrc: Oral  Height: 6\' 1"  (1.854 m)  Weight: 213 lb 6.4 oz (96.798 kg)  SpO2: 91%    Gen: Pleasant, well-nourished, in no distress,  normal affect  ENT: No lesions,  mouth clear,  oropharynx clear, no postnasal drip  Neck: No JVD, no TMG, no carotid  bruits  Lungs: No use of accessory muscles, no dullness to percussion, distant BS  Cardiovascular: RRR, heart sounds normal, no murmur or gallops, no peripheral edema  Abdomen: soft and NT, no HSM,  BS normal  Musculoskeletal: No deformities, no cyanosis or clubbing  Neuro: alert, non focal  Skin: Warm, no lesions or rashes     amb sats 90-91% after one lap RA  Assessment & Plan:   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 Gold C copd, PFTs from 06/2014 stable , c/w emphysema/copd. Doubt amiodarone toxicity Plan Ok to cont amiodarone Cont advair and prn albuterol Only needs oxygen 2L qhs, no need for daytime oxygen      Updated Medication List Outpatient Encounter Prescriptions as of 01/14/2015  Medication Sig  . albuterol (PROVENTIL) (2.5 MG/3ML) 0.083% nebulizer solution Take 3 mLs (2.5 mg total) by nebulization every 6 (six) hours as needed for wheezing or shortness of breath.  Marland Kitchen amiodarone (PACERONE) 200 MG tablet Take 1 tablet (200 mg total) by mouth daily.  Marland Kitchen apixaban (ELIQUIS) 5 MG TABS tablet TAKE 1 TABLET BY MOUTH TWICE A DAY  . carvedilol (COREG) 12.5 MG tablet TAKE 1 TABLET (  12.5 MG TOTAL) BY MOUTH 2 (TWO) TIMES DAILY WITH A MEAL.  . cholecalciferol (VITAMIN D) 1000 UNITS tablet Take 1,000 Units by mouth daily.  . finasteride (PROSCAR) 5 MG tablet Take 5 mg by mouth every morning.  . Fluticasone-Salmeterol (ADVAIR DISKUS) 250-50 MCG/DOSE AEPB Inhale 1 puff into the lungs 2 (two) times daily.  . furosemide (LASIX) 40 MG tablet Take 1 tablet (40 mg total) by mouth daily. <MUST MAKE APPOINTMENT FOR REFILLS>  . levothyroxine (SYNTHROID, LEVOTHROID) 100 MCG tablet Take 100 mcg by mouth daily.  Marland Kitchen losartan (COZAAR) 100 MG tablet Take 100 mg by mouth daily.   . Melatonin 5 MG TABS Take 10 mg by mouth at bedtime as needed (for sleep).  . nitroGLYCERIN (NITROSTAT) 0.4 MG SL tablet Place 0.4 mg under the tongue every 5 (five) minutes as needed. For  chest pain  . NON FORMULARY Place 2 L into the nose at bedtime.  . Omega-3 Fatty Acids (FISH OIL) 1200 MG CAPS Take 1 capsule by mouth 2 (two) times daily.    . pantoprazole (PROTONIX) 40 MG tablet Take 40 mg by mouth at bedtime.   . Pediatric Multiple Vit-C-FA (MULTIVITAMIN ANIMAL SHAPES, WITH CA/FA,) WITH C & FA CHEW chewable tablet Chew 1 tablet by mouth daily.  . psyllium (METAMUCIL) 58.6 % powder Take 1 packet by mouth daily.   . rosuvastatin (CRESTOR) 5 MG tablet Take 5 mg by mouth at bedtime.   . Tamsulosin HCl (FLOMAX) 0.4 MG CAPS Take 0.4 mg by mouth at bedtime.   . vitamin B-12 (CYANOCOBALAMIN) 1000 MCG tablet Take 1,000 mcg by mouth daily.  . [DISCONTINUED] albuterol (PROVENTIL) (2.5 MG/3ML) 0.083% nebulizer solution Take 3 mLs (2.5 mg total) by nebulization every 6 (six) hours as needed for wheezing or shortness of breath.  . [DISCONTINUED] Fluticasone-Salmeterol (ADVAIR DISKUS) 250-50 MCG/DOSE AEPB Inhale 1 puff into the lungs 2 (two) times daily.  . [DISCONTINUED] Albuterol Sulfate (PROAIR RESPICLICK) 774 (90 BASE) MCG/ACT AEPB Inhale 2 puffs into the lungs every 6 (six) hours as needed. (Patient not taking: Reported on 01/14/2015)

## 2015-01-27 ENCOUNTER — Other Ambulatory Visit: Payer: Self-pay | Admitting: Cardiology

## 2015-02-22 ENCOUNTER — Ambulatory Visit: Payer: Managed Care, Other (non HMO) | Admitting: Nurse Practitioner

## 2015-02-22 ENCOUNTER — Telehealth: Payer: Self-pay | Admitting: Cardiology

## 2015-02-22 NOTE — Telephone Encounter (Signed)
NEw Message  Pt had to resch his [name] appt from today 4/1 to 5/5. He also has appt w/ Cecille Rubin sched for June and a recall for Dr. Percival Spanish for middle of May. Pt wanted to know if he the other appts or if Lori's appt on 5/5 is enough. Please call back and discuss.

## 2015-02-22 NOTE — Telephone Encounter (Signed)
According to dr hochrein's office note from February, he wanted the patient to see lori in 2 months. If lori does not have any other appt in April he can keep, would keep the appt 5/5.

## 2015-03-07 DIAGNOSIS — I252 Old myocardial infarction: Secondary | ICD-10-CM | POA: Diagnosis not present

## 2015-03-07 DIAGNOSIS — K639 Disease of intestine, unspecified: Secondary | ICD-10-CM | POA: Diagnosis not present

## 2015-03-09 ENCOUNTER — Other Ambulatory Visit: Payer: Self-pay | Admitting: Cardiology

## 2015-03-14 DIAGNOSIS — I10 Essential (primary) hypertension: Secondary | ICD-10-CM | POA: Diagnosis not present

## 2015-03-14 DIAGNOSIS — R972 Elevated prostate specific antigen [PSA]: Secondary | ICD-10-CM | POA: Diagnosis not present

## 2015-03-14 DIAGNOSIS — E039 Hypothyroidism, unspecified: Secondary | ICD-10-CM | POA: Diagnosis not present

## 2015-03-19 ENCOUNTER — Telehealth: Payer: Self-pay | Admitting: Cardiology

## 2015-03-19 ENCOUNTER — Ambulatory Visit (INDEPENDENT_AMBULATORY_CARE_PROVIDER_SITE_OTHER): Payer: Managed Care, Other (non HMO) | Admitting: *Deleted

## 2015-03-19 DIAGNOSIS — E119 Type 2 diabetes mellitus without complications: Secondary | ICD-10-CM | POA: Diagnosis not present

## 2015-03-19 DIAGNOSIS — E039 Hypothyroidism, unspecified: Secondary | ICD-10-CM | POA: Diagnosis not present

## 2015-03-19 DIAGNOSIS — I255 Ischemic cardiomyopathy: Secondary | ICD-10-CM

## 2015-03-19 DIAGNOSIS — I5022 Chronic systolic (congestive) heart failure: Secondary | ICD-10-CM

## 2015-03-19 DIAGNOSIS — I10 Essential (primary) hypertension: Secondary | ICD-10-CM | POA: Diagnosis not present

## 2015-03-19 DIAGNOSIS — R972 Elevated prostate specific antigen [PSA]: Secondary | ICD-10-CM | POA: Diagnosis not present

## 2015-03-19 NOTE — Telephone Encounter (Signed)
Confirmed remote transmission

## 2015-03-20 ENCOUNTER — Encounter: Payer: Self-pay | Admitting: Cardiology

## 2015-03-20 ENCOUNTER — Encounter: Payer: Self-pay | Admitting: Internal Medicine

## 2015-03-20 DIAGNOSIS — I255 Ischemic cardiomyopathy: Secondary | ICD-10-CM | POA: Diagnosis not present

## 2015-03-20 DIAGNOSIS — I5022 Chronic systolic (congestive) heart failure: Secondary | ICD-10-CM

## 2015-03-20 NOTE — Progress Notes (Signed)
Remote ICD transmission.   

## 2015-03-22 LAB — MDC_IDC_ENUM_SESS_TYPE_REMOTE
Battery Remaining Percentage: 64 %
Battery Voltage: 2.95 V
Brady Statistic AP VS Percent: 59 %
Brady Statistic AS VP Percent: 1 %
Brady Statistic RA Percent Paced: 57 %
Brady Statistic RV Percent Paced: 1 %
Date Time Interrogation Session: 20160427150712
HIGH POWER IMPEDANCE MEASURED VALUE: 79 Ohm
HighPow Impedance: 79 Ohm
Lead Channel Impedance Value: 390 Ohm
Lead Channel Impedance Value: 440 Ohm
Lead Channel Pacing Threshold Amplitude: 0.5 V
Lead Channel Pacing Threshold Amplitude: 1 V
Lead Channel Pacing Threshold Pulse Width: 0.5 ms
Lead Channel Sensing Intrinsic Amplitude: 12 mV
Lead Channel Sensing Intrinsic Amplitude: 2.5 mV
Lead Channel Setting Pacing Amplitude: 1.5 V
Lead Channel Setting Pacing Amplitude: 2.5 V
Lead Channel Setting Pacing Pulse Width: 0.6 ms
MDC IDC MSMT BATTERY REMAINING LONGEVITY: 61 mo
MDC IDC MSMT LEADCHNL RV PACING THRESHOLD PULSEWIDTH: 0.6 ms
MDC IDC PG SERIAL: 7003419
MDC IDC SET LEADCHNL RV SENSING SENSITIVITY: 0.5 mV
MDC IDC SET ZONE DETECTION INTERVAL: 250 ms
MDC IDC STAT BRADY AP VP PERCENT: 1 %
MDC IDC STAT BRADY AS VS PERCENT: 39 %
Zone Setting Detection Interval: 300 ms

## 2015-03-28 ENCOUNTER — Encounter: Payer: Self-pay | Admitting: Nurse Practitioner

## 2015-03-28 ENCOUNTER — Ambulatory Visit (INDEPENDENT_AMBULATORY_CARE_PROVIDER_SITE_OTHER): Payer: Managed Care, Other (non HMO) | Admitting: Nurse Practitioner

## 2015-03-28 VITALS — BP 110/70 | HR 61 | Ht 73.0 in | Wt 214.0 lb

## 2015-03-28 DIAGNOSIS — Z7901 Long term (current) use of anticoagulants: Secondary | ICD-10-CM

## 2015-03-28 DIAGNOSIS — I480 Paroxysmal atrial fibrillation: Secondary | ICD-10-CM | POA: Diagnosis not present

## 2015-03-28 DIAGNOSIS — I255 Ischemic cardiomyopathy: Secondary | ICD-10-CM | POA: Diagnosis not present

## 2015-03-28 DIAGNOSIS — Z79899 Other long term (current) drug therapy: Secondary | ICD-10-CM | POA: Diagnosis not present

## 2015-03-28 NOTE — Progress Notes (Signed)
CARDIOLOGY OFFICE NOTE  Date:  03/28/2015    Corey Tyler Date of Birth: December 03, 1940 Medical Record #[id number]  PCP:  [name], MD  Cardiologist:  [facility]    Chief Complaint  Patient presents with  . Follow-up    Follow up visit - seen for Dr. Percival Spanish     History of Present Illness: Corey Tyler is a 74 y.o. male who presents today for a follow up visit. Seen for Dr. Percival Spanish and Dr. [name].  He has chronic systolic HF and AF - Cardioverted in 2014. Has had past GI bleed on Xarelto but tolerates Eliquis without issue. He is on amiodarone. Has had past polymorphic VT with ICD discharge x 1 (December 2014) - has ICD in place. Has developed hypothyroidism - most likely from his amiodarone. Last Myoview to rule out ischemia following his ICD shock showed large scar. His other issues include HTN, COPD, HLD, PVCs, atrial tach and OA. He has ?silent past MI with cath in 2000 and again in 2013 with minimal CAD.   Seen back by Dr. Percival Spanish in February after having some dyspnea.  Comes back today. Here alone. He does not really know why he is here. He says he is feeling good. Breathing is stable. No chest pain. No ICD shocks. Working full time at [employer] Walking daily. He has had some left sided belly pain - saw the NP with Dr. Maudie Mercury - put on Protonix - now with nausea - was vomiting earlier this week - he stopped the Protonix - may be better. Still has his gallbladder. Notes that eating makes him very nauseous. He is actively trying to lose weight. Has regular labs with Dr. Maudie Mercury - last in April that were reviewed.   Past Medical History  Diagnosis Date  . Old myocardial infarction     a. ?Silent MI. b. Large fixed inferolateral defect c/w with prior infarct, no ischemia. c. Cath in 2000/2013 with only minimal CAD.  Marland Kitchen Hypertension   . COPD (chronic obstructive pulmonary disease)   . Hyperlipidemia   . Chronic systolic heart failure     EF down to 20 to 25% per echo November  2012  . NICM (nonischemic cardiomyopathy)     a. Normal cors 2000. b. Minimal plaque 2013.  . Pulmonary nodule, right     Last scan in 2010 showing stability; felt to be benign.  . Cardiac defibrillator -dual  [facility]   . Atrial tachycardia-non sustained     a. Noted on 03/2012 interrogation.  Marland Kitchen A-fib     a. Noted on 12/2012 interrogation, placed on Apixaban and ultimately stopped NOACs 2/2 personal decision 8/14.  Marland Kitchen PVC's (premature ventricular contractions)   . ICD (implantable cardiac defibrillator) in place   . Ventricular tachycardia, polymorphic     Rx via ICD 12/14  . Arthritis     Past Surgical History  Procedure Laterality Date  . Colon surgery    . Cardiac catheterization  2000  . Icd  12/2011    [name]  . Cardiac defibrillator placement    . Appendectomy    . Tee without cardioversion N/A 03/30/2013    Procedure: TRANSESOPHAGEAL ECHOCARDIOGRAM (TEE);  Surgeon: Thayer Headings, MD;  Location: [facility];  Service: Cardiovascular;  Laterality: N/A;  . Cardioversion  03/30/2013    Procedure: CARDIOVERSION;  Surgeon: Thayer Headings, MD;  Location: [place];  Service: Cardiovascular;;  . Colonoscopy N/A 09/25/2013    Procedure: COLONOSCOPY;  Surgeon: Jerene Bears, MD;  Location: [facility];  Service: Endoscopy;  Laterality: N/A;  . Implantable cardioverter defibrillator implant N/A 01/06/2012    Procedure: IMPLANTABLE CARDIOVERTER DEFIBRILLATOR IMPLANT;  Surgeon: Deboraha Sprang, MD;  Location: [facility] CATH LAB;  Service: Cardiovascular;  Laterality: N/A;     Medications: Current Outpatient Prescriptions  Medication Sig Dispense Refill  . albuterol (PROVENTIL) (2.5 MG/3ML) 0.083% nebulizer solution Take 3 mLs (2.5 mg total) by nebulization every 6 (six) hours as needed for wheezing or shortness of breath. 75 mL 4  . amiodarone (PACERONE) 200 MG tablet Take 1 tablet (200 mg total) by mouth daily. 90 tablet 3  . carvedilol (COREG) 12.5 MG tablet TAKE 1 TABLET (12.5 MG TOTAL) BY  MOUTH 2 (TWO) TIMES DAILY WITH A MEAL. 60 tablet 6  . cholecalciferol (VITAMIN D) 1000 UNITS tablet Take 1,000 Units by mouth daily.    Marland Kitchen ELIQUIS 5 MG TABS tablet TAKE 1 TABLET BY MOUTH TWICE A DAY 60 tablet 5  . finasteride (PROSCAR) 5 MG tablet Take 5 mg by mouth every morning.    . Fluticasone-Salmeterol (ADVAIR DISKUS) 250-50 MCG/DOSE AEPB Inhale 1 puff into the lungs 2 (two) times daily. 60 each 11  . furosemide (LASIX) 40 MG tablet Take 1 tablet (40 mg total) by mouth daily. 90 tablet 0  . levothyroxine (SYNTHROID, LEVOTHROID) 100 MCG tablet Take 100 mcg by mouth daily.    Marland Kitchen losartan (COZAAR) 100 MG tablet Take 100 mg by mouth daily.     . nitroGLYCERIN (NITROSTAT) 0.4 MG SL tablet Place 0.4 mg under the tongue every 5 (five) minutes as needed. For chest pain    . NON FORMULARY Place 2 L into the nose at bedtime.    . pantoprazole (PROTONIX) 40 MG tablet Take 40 mg by mouth at bedtime.     . psyllium (METAMUCIL) 58.6 % powder Take 1 packet by mouth daily.     . Tamsulosin HCl (FLOMAX) 0.4 MG CAPS Take 0.4 mg by mouth at bedtime.     . CRESTOR 10 MG tablet Take 5 mg by mouth daily.     No current facility-administered medications for this visit.    Allergies: Allergies  Allergen Reactions  . Xarelto [Rivaroxaban] Other (See Comments)    Bleeding  . Adhesive [Tape] Hives, Itching and Rash  . Atorvastatin Other (See Comments)    cough    Social History: The patient  reports that he quit smoking about 31 years ago. His smoking use included Cigarettes. He has a 100 pack-year smoking history. He has never used smokeless tobacco. He reports that he does not drink alcohol or use illicit drugs.   Family History: The patient's family history includes Emphysema in his mother; Heart disease in his mother; Heart failure in his father.   Review of Systems: Please see the history of present illness.   Otherwise, the review of systems is positive for nausea.   All other systems are reviewed  and negative.   Physical Exam: VS:  BP 110/70 mmHg  Pulse 61  Ht 6\' 1"  (1.854 m)  Wt 214 lb (97.07 kg)  BMI 28.24 kg/m2 .  BMI Body mass index is 28.24 kg/(m^2).  Wt Readings from Last 3 Encounters:  03/28/15 214 lb (97.07 kg)  01/14/15 213 lb 6.4 oz (96.798 kg)  12/25/14 214 lb 8 oz (97.297 kg)    General: Pleasant. Well developed, well nourished and in no acute distress.  HEENT: Normal. Neck: Supple, no JVD,  carotid bruits, or masses noted.  Cardiac: Regular rate and rhythm. No murmurs, rubs, or gallops. No edema.  Respiratory:  Lungs are clear to auscultation bilaterally with normal work of breathing.  GI: Soft and nontender.  MS: No deformity or atrophy. Gait and ROM intact. Skin: Warm and dry. Color is normal.  Neuro:  Strength and sensation are intact and no gross focal deficits noted.  Psych: Alert, appropriate and with normal affect.   LABORATORY DATA:  EKG:  EKG is not ordered today.   Lab Results  Component Value Date   WBC 9.0 12/22/2013   HGB 14.2 12/22/2013   HCT 41.3 12/22/2013   PLT 211 12/22/2013   GLUCOSE 87 04/17/2014   CHOL 113 04/17/2014   TRIG 58.0 04/17/2014   HDL 49.80 04/17/2014   LDLCALC 52 04/17/2014   ALT 23 04/17/2014   AST 19 04/17/2014   NA 139 04/17/2014   K 3.6 04/17/2014   CL 104 04/17/2014   CREATININE 1.4 04/17/2014   BUN 19 04/17/2014   CO2 29 04/17/2014   TSH 8.63* 04/17/2014   INR 1.15 09/23/2013   HGBA1C 6.0* 03/27/2013    BNP (last 3 results) No results for input(s): BNP in the last 8760 hours.  ProBNP (last 3 results) No results for input(s): PROBNP in the last 8760 hours.   Other Studies Reviewed Today:  Myoview Impression from January 2015  Exercise Capacity: [place] with low level exercise.  BP Response: Normal blood pressure response.  Clinical Symptoms: No chest pain.  ECG Impression: No significant ST segment change suggestive of ischemia.  Comparison with Prior Nuclear Study: No significant  change from previous scan of 2012.  Overall Impression: Large scar in the inferior, inferolateral and anterolateral distribution; cannot exclude some soft tissue attenuation. No evidence for significant ischemia. No significant change from [place] of 2012.  LV Ejection Fraction: Study not gated. LV Wall Motion: Study not gated  Dorris Carnes    Echo Study Conclusions from May 2014 - Left ventricle: Systolic function was severely reduced. The estimated ejection fraction was in the range of 25% to 30%. - Aortic valve: No evidence of vegetation. Trivial regurgitation. - Mitral valve: Mild to moderate regurgitation. - Left atrium: The atrium was dilated. No evidence of thrombus in the atrial cavity or appendage. - Tricuspid valve: No evidence of vegetation. - Pulmonic valve: No evidence of vegetation.  Assessment / Plan: 1. Cardiomyopathy - stable on current regimen.   2. Underlying ICD/VT - followed by Dr. [name]  3. PAF  4. High risk medication use with Eliquis and amiodarone  5. COPD - on home oxygen at night only. Doubted to have amiodarone toxicity per pulmonary.   6.  Hypothyroidism - most likely from amiodarone as well - Followed by Dr. Maudie Mercury  7. Nausea - he will follow up with Dr. Maudie Mercury.   Current medicines are reviewed with the patient today.  The patient does not have concerns regarding medicines other than what has been noted above.  The following changes have been made:  See above.  Labs/ tests ordered today include:   No orders of the defined types were placed in this encounter.     Disposition:   FU with me in 4 months.   Patient is agreeable to this plan and will call if any problems develop in the interim.   Signed: Burtis Junes, RN, ANP-C 03/28/2015 2:11 PM  [facility] [address] [place], [place]  [address] Phone: [phone] Fax: [phone]

## 2015-03-28 NOTE — Patient Instructions (Addendum)
We will be checking the following labs today - NONE   Medication Instructions:    Continue with your current medicines.     Testing/Procedures To Be Arranged:  N/A  Follow-Up:   We will see you back in 4 months    Other Special Instructions:   Touch base with Dr. Maudie Mercury about your nausea/? Needs ultrasound or endoscopy  Send in your device transmission tonight  Call the [facility] office at [phone] if you have any questions, problems or concerns.

## 2015-04-02 DIAGNOSIS — R972 Elevated prostate specific antigen [PSA]: Secondary | ICD-10-CM | POA: Diagnosis not present

## 2015-04-02 DIAGNOSIS — N400 Enlarged prostate without lower urinary tract symptoms: Secondary | ICD-10-CM | POA: Diagnosis not present

## 2015-04-02 DIAGNOSIS — N411 Chronic prostatitis: Secondary | ICD-10-CM | POA: Diagnosis not present

## 2015-04-08 DIAGNOSIS — K219 Gastro-esophageal reflux disease without esophagitis: Secondary | ICD-10-CM | POA: Diagnosis not present

## 2015-04-12 ENCOUNTER — Encounter: Payer: Self-pay | Admitting: Cardiology

## 2015-05-15 ENCOUNTER — Other Ambulatory Visit: Payer: Self-pay | Admitting: Cardiology

## 2015-05-15 NOTE — Telephone Encounter (Signed)
REFILL 

## 2015-05-27 ENCOUNTER — Other Ambulatory Visit: Payer: Self-pay | Admitting: Internal Medicine

## 2015-05-29 ENCOUNTER — Encounter: Payer: Self-pay | Admitting: Gastroenterology

## 2015-06-01 ENCOUNTER — Other Ambulatory Visit: Payer: Self-pay | Admitting: Cardiology

## 2015-06-13 DIAGNOSIS — E119 Type 2 diabetes mellitus without complications: Secondary | ICD-10-CM | POA: Diagnosis not present

## 2015-06-13 DIAGNOSIS — E039 Hypothyroidism, unspecified: Secondary | ICD-10-CM | POA: Diagnosis not present

## 2015-06-13 DIAGNOSIS — I10 Essential (primary) hypertension: Secondary | ICD-10-CM | POA: Diagnosis not present

## 2015-06-14 ENCOUNTER — Ambulatory Visit: Payer: Managed Care, Other (non HMO) | Admitting: Nurse Practitioner

## 2015-06-18 ENCOUNTER — Ambulatory Visit (INDEPENDENT_AMBULATORY_CARE_PROVIDER_SITE_OTHER): Payer: Managed Care, Other (non HMO) | Admitting: *Deleted

## 2015-06-18 DIAGNOSIS — E785 Hyperlipidemia, unspecified: Secondary | ICD-10-CM | POA: Diagnosis not present

## 2015-06-18 DIAGNOSIS — Z95810 Presence of automatic (implantable) cardiac defibrillator: Secondary | ICD-10-CM

## 2015-06-18 DIAGNOSIS — I428 Other cardiomyopathies: Secondary | ICD-10-CM

## 2015-06-18 DIAGNOSIS — I429 Cardiomyopathy, unspecified: Secondary | ICD-10-CM | POA: Diagnosis not present

## 2015-06-18 DIAGNOSIS — I5022 Chronic systolic (congestive) heart failure: Secondary | ICD-10-CM | POA: Diagnosis not present

## 2015-06-18 DIAGNOSIS — E039 Hypothyroidism, unspecified: Secondary | ICD-10-CM | POA: Diagnosis not present

## 2015-06-18 DIAGNOSIS — R739 Hyperglycemia, unspecified: Secondary | ICD-10-CM | POA: Diagnosis not present

## 2015-06-18 DIAGNOSIS — I10 Essential (primary) hypertension: Secondary | ICD-10-CM | POA: Diagnosis not present

## 2015-06-20 ENCOUNTER — Telehealth: Payer: Self-pay | Admitting: Critical Care Medicine

## 2015-06-20 NOTE — Telephone Encounter (Signed)
lmtcb x1 

## 2015-06-21 NOTE — Telephone Encounter (Signed)
Called and spoke to pt. Pt requesting an appt with PW. ROV scheduled with PW on 8.15.16 at 0945. Pt verbalized understanding and denied any further questions or concerns at this time.

## 2015-06-26 ENCOUNTER — Telehealth: Payer: Self-pay | Admitting: Internal Medicine

## 2015-06-26 NOTE — Telephone Encounter (Signed)
New Message  Patient c/o Palpitations:  High priority if patient c/o lightheadedness and shortness of breath.  1. How long have you been having palpitations? Afib within 24 hours. Pulse jumps all over the place   2. Are you currently experiencing lightheadedness and shortness of breath? Sometimes at night.   3. Have you checked your BP and heart rate? (document readings) p  4. Are you experiencing any other symptoms? No. He has no other symptoms

## 2015-07-01 LAB — CUP PACEART REMOTE DEVICE CHECK
Battery Remaining Longevity: 58 mo
Battery Voltage: 2.95 V
Brady Statistic AP VP Percent: 1 %
Brady Statistic AP VS Percent: 60 %
Brady Statistic AS VP Percent: 1 %
Brady Statistic AS VS Percent: 38 %
Brady Statistic RV Percent Paced: 1 %
Date Time Interrogation Session: 20160727032526
HIGH POWER IMPEDANCE MEASURED VALUE: 77 Ohm
HighPow Impedance: 77 Ohm
Lead Channel Impedance Value: 360 Ohm
Lead Channel Impedance Value: 430 Ohm
Lead Channel Pacing Threshold Amplitude: 0.5 V
Lead Channel Pacing Threshold Pulse Width: 0.5 ms
Lead Channel Pacing Threshold Pulse Width: 0.6 ms
Lead Channel Sensing Intrinsic Amplitude: 12 mV
Lead Channel Sensing Intrinsic Amplitude: 3.5 mV
Lead Channel Setting Pacing Amplitude: 1.5 V
Lead Channel Setting Pacing Amplitude: 2.5 V
Lead Channel Setting Pacing Pulse Width: 0.6 ms
MDC IDC MSMT BATTERY REMAINING PERCENTAGE: 61 %
MDC IDC MSMT LEADCHNL RV PACING THRESHOLD AMPLITUDE: 1 V
MDC IDC PG SERIAL: 7003419
MDC IDC SET LEADCHNL RV SENSING SENSITIVITY: 0.5 mV
MDC IDC SET ZONE DETECTION INTERVAL: 250 ms
MDC IDC SET ZONE DETECTION INTERVAL: 300 ms
MDC IDC STAT BRADY RA PERCENT PACED: 58 %

## 2015-07-08 ENCOUNTER — Ambulatory Visit (INDEPENDENT_AMBULATORY_CARE_PROVIDER_SITE_OTHER): Payer: Managed Care, Other (non HMO) | Admitting: Critical Care Medicine

## 2015-07-08 ENCOUNTER — Encounter: Payer: Self-pay | Admitting: Critical Care Medicine

## 2015-07-08 VITALS — BP 136/70 | HR 62 | Temp 98.1°F | Ht 73.0 in | Wt 215.4 lb

## 2015-07-08 DIAGNOSIS — I255 Ischemic cardiomyopathy: Secondary | ICD-10-CM

## 2015-07-08 DIAGNOSIS — J432 Centrilobular emphysema: Secondary | ICD-10-CM | POA: Diagnosis not present

## 2015-07-08 MED ORDER — FLUTICASONE-SALMETEROL 250-50 MCG/DOSE IN AEPB: 1.0000 | INHALATION_SPRAY | Freq: Two times a day (BID) | RESPIRATORY_TRACT | Status: DC

## 2015-07-08 NOTE — Assessment & Plan Note (Signed)
Stable gold C copd Plan  rov 1 yr  cont advair and oxygen This patient continues to use and benefit from her oxygen therapy and has re qualified at this visit for continued oxygen therapy

## 2015-07-08 NOTE — Progress Notes (Signed)
Subjective:    Patient ID: Corey Tyler, male    DOB: 1941-06-15, 74 y.o.   MRN: [id number]  HPI 07/08/2015 Chief Complaint  Patient presents with  . 6 month follow up    Breathing doing well overall.  SOB unchanged.  Coughing and wheezing some qhs - unchanged.  Cough prod with white mucus.  No chest tightness, CP, f/c/s, or hemoptysis.    Paces self without oxygen . Min cough  White mucus.  No real chest tightness.  No chest pain.  occ wheezing at night . Follows with Corey Tyler PCP Pt denies any significant sore throat, nasal congestion or excess secretions, fever, chills, sweats, unintended weight loss, pleurtic or exertional chest pain, orthopnea PND, or leg swelling Pt denies any increase in rescue therapy over baseline, denies waking up needing it or having any early am or nocturnal exacerbations of coughing/wheezing/or dyspnea. Pt also denies any obvious fluctuation in symptoms with  weather or environmental change or other alleviating or aggravating factors   Current Medications, Allergies, Complete Past Medical History, Past Surgical History, Family History, and Social History were reviewed in [facility] record per todays encounter:  07/08/2015  Review of Systems  Constitutional: Negative.   HENT: Negative.  Negative for ear pain, postnasal drip, rhinorrhea, sinus pressure, sore throat, trouble swallowing and voice change.   Eyes: Negative.   Respiratory: Positive for cough, shortness of breath and wheezing. Negative for apnea, choking, chest tightness and stridor.   Cardiovascular: Negative.  Negative for chest pain, palpitations and leg swelling.  Gastrointestinal: Negative.  Negative for nausea, vomiting, abdominal pain and abdominal distention.  Genitourinary: Negative.   Musculoskeletal: Negative.  Negative for myalgias and arthralgias.  Skin: Negative.  Negative for rash.  Allergic/Immunologic: Negative.  Negative for environmental allergies and food  allergies.  Neurological: Negative.  Negative for dizziness, syncope, weakness and headaches.  Hematological: Negative.  Negative for adenopathy. Does not bruise/bleed easily.  Psychiatric/Behavioral: Negative.  Negative for sleep disturbance and agitation. The patient is not nervous/anxious.        Objective:   Physical Exam  Filed Vitals:   07/08/15 0945  BP: 136/70  Pulse: 62  Temp: 98.1 F (36.7 C)  TempSrc: Oral  Height: 6\' 1"  (1.854 m)  Weight: 215 lb 6.4 oz (97.705 kg)  SpO2: 92%    Gen: Pleasant, well-nourished, in no distress,  normal affect  ENT: No lesions,  mouth clear,  oropharynx clear, no postnasal drip  Neck: No JVD, no TMG, no carotid bruits  Lungs: No use of accessory muscles, no dullness to percussion, distant bs  Cardiovascular: RRR, heart sounds normal, no murmur or gallops, no peripheral edema  Abdomen: soft and NT, no HSM,  BS normal  Musculoskeletal: No deformities, no cyanosis or clubbing  Neuro: alert, non focal  Skin: Warm, no lesions or rashes  No results found.       Assessment & Plan:  I personally reviewed all images and lab data in the [facility] system as well as any outside material available during this office visit and agree with the  radiology impressions.   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 Stable gold C copd Plan  rov 1 yr  cont advair and oxygen This patient continues to use and benefit from his oxygen therapy and has re qualified at this visit for continued oxygen therapy    Corey Tyler was seen today for 6 month follow up.  Diagnoses and all orders for this  visit:  Centrilobular emphysema  Other orders -     Fluticasone-Salmeterol (ADVAIR DISKUS) 250-50 MCG/DOSE AEPB; Inhale 1 puff into the lungs 2 (two) times daily.

## 2015-07-08 NOTE — Patient Instructions (Signed)
No change in medications. Return in          1 year 

## 2015-07-10 ENCOUNTER — Encounter: Payer: Self-pay | Admitting: Cardiology

## 2015-07-15 ENCOUNTER — Encounter: Payer: Self-pay | Admitting: Internal Medicine

## 2015-07-16 NOTE — Telephone Encounter (Signed)
ERROR

## 2015-07-18 ENCOUNTER — Emergency Department (HOSPITAL_COMMUNITY)
Admission: EM | Admit: 2015-07-18 | Discharge: 2015-07-18 | Disposition: A | Discharge status: Home / Self Care | Payer: Managed Care, Other (non HMO) | Attending: Emergency Medicine | Admitting: Emergency Medicine

## 2015-07-18 ENCOUNTER — Encounter (HOSPITAL_COMMUNITY): Payer: Self-pay | Admitting: Emergency Medicine

## 2015-07-18 DIAGNOSIS — I4891 Unspecified atrial fibrillation: Secondary | ICD-10-CM | POA: Diagnosis not present

## 2015-07-18 DIAGNOSIS — Z87891 Personal history of nicotine dependence: Secondary | ICD-10-CM | POA: Diagnosis not present

## 2015-07-18 DIAGNOSIS — M1990 Unspecified osteoarthritis, unspecified site: Secondary | ICD-10-CM | POA: Diagnosis not present

## 2015-07-18 DIAGNOSIS — I5022 Chronic systolic (congestive) heart failure: Secondary | ICD-10-CM | POA: Insufficient documentation

## 2015-07-18 DIAGNOSIS — I10 Essential (primary) hypertension: Secondary | ICD-10-CM | POA: Insufficient documentation

## 2015-07-18 DIAGNOSIS — Z95810 Presence of automatic (implantable) cardiac defibrillator: Secondary | ICD-10-CM | POA: Diagnosis not present

## 2015-07-18 DIAGNOSIS — J449 Chronic obstructive pulmonary disease, unspecified: Secondary | ICD-10-CM | POA: Insufficient documentation

## 2015-07-18 DIAGNOSIS — Z7951 Long term (current) use of inhaled steroids: Secondary | ICD-10-CM | POA: Insufficient documentation

## 2015-07-18 DIAGNOSIS — I951 Orthostatic hypotension: Secondary | ICD-10-CM | POA: Diagnosis not present

## 2015-07-18 DIAGNOSIS — Z8639 Personal history of other endocrine, nutritional and metabolic disease: Secondary | ICD-10-CM | POA: Diagnosis not present

## 2015-07-18 DIAGNOSIS — I252 Old myocardial infarction: Secondary | ICD-10-CM | POA: Diagnosis not present

## 2015-07-18 DIAGNOSIS — Z79899 Other long term (current) drug therapy: Secondary | ICD-10-CM | POA: Diagnosis not present

## 2015-07-18 DIAGNOSIS — Z91040 Latex allergy status: Secondary | ICD-10-CM | POA: Insufficient documentation

## 2015-07-18 DIAGNOSIS — Z9889 Other specified postprocedural states: Secondary | ICD-10-CM | POA: Insufficient documentation

## 2015-07-18 DIAGNOSIS — R42 Dizziness and giddiness: Secondary | ICD-10-CM | POA: Diagnosis present

## 2015-07-18 LAB — CBC
HCT: 41.6 % (ref 39.0–52.0)
Hemoglobin: 13.7 g/dL (ref 13.0–17.0)
MCH: 31.6 pg (ref 26.0–34.0)
MCHC: 32.9 g/dL (ref 30.0–36.0)
MCV: 95.9 fL (ref 78.0–100.0)
Platelets: 166 10*3/uL (ref 150–400)
RBC: 4.34 MIL/uL (ref 4.22–5.81)
RDW: 14.5 % (ref 11.5–15.5)
WBC: 5.7 10*3/uL (ref 4.0–10.5)

## 2015-07-18 LAB — CBG MONITORING, ED: GLUCOSE-CAPILLARY: 109 mg/dL — AB (ref 65–99)

## 2015-07-18 LAB — PROTIME-INR
INR: 1.19 (ref 0.00–1.49)
Prothrombin Time: 15.2 seconds (ref 11.6–15.2)

## 2015-07-18 LAB — BASIC METABOLIC PANEL
ANION GAP: 8 (ref 5–15)
BUN: 15 mg/dL (ref 6–20)
CALCIUM: 8.4 mg/dL — AB (ref 8.9–10.3)
CO2: 25 mmol/L (ref 22–32)
Chloride: 105 mmol/L (ref 101–111)
Creatinine, Ser: 1.29 mg/dL — ABNORMAL HIGH (ref 0.61–1.24)
GFR calc Af Amer: >60 mL/min (ref >60)
GFR, EST NON AFRICAN AMERICAN: 53 mL/min — AB (ref >60)
GLUCOSE: 186 mg/dL — AB (ref 65–99)
Potassium: 3.6 mmol/L (ref 3.5–5.1)
SODIUM: 138 mmol/L (ref 135–145)

## 2015-07-18 LAB — URINALYSIS, ROUTINE W REFLEX MICROSCOPIC
BILIRUBIN URINE: NEGATIVE
Glucose, UA: NEGATIVE mg/dL
HGB URINE DIPSTICK: NEGATIVE
KETONES UR: NEGATIVE mg/dL
Leukocytes, UA: NEGATIVE
Nitrite: NEGATIVE
PROTEIN: NEGATIVE mg/dL
Specific Gravity, Urine: 1.008 (ref 1.005–1.030)
UROBILINOGEN UA: 0.2 mg/dL (ref 0.0–1.0)
pH: 5.5 (ref 5.0–8.0)

## 2015-07-18 LAB — APTT: aPTT: 31 seconds (ref 24–37)

## 2015-07-18 LAB — I-STAT TROPONIN, ED: Troponin i, poc: 0.01 ng/mL (ref 0.00–0.08)

## 2015-07-18 MED ORDER — SODIUM CHLORIDE 0.9 % IV BOLUS (SEPSIS)
500.0000 mL | Freq: Once | INTRAVENOUS | Status: AC
  Administered 2015-07-18: 500 mL via INTRAVENOUS

## 2015-07-18 NOTE — Discharge Instructions (Signed)
Please ensure you are well hydrated, and see your primary care doctor or cardiologist next week.   Orthostatic Hypotension Orthostatic hypotension is a sudden drop in blood pressure. It happens when you quickly stand up from a seated or lying position. You may feel dizzy or light-headed. This can last for just a few seconds or for up to a few minutes. It is usually not a serious problem. However, if this happens frequently or gets worse, it can be a sign of something more serious. CAUSES  Different things can cause orthostatic hypotension, including:   Loss of body fluids (dehydration).  Medicines that lower blood pressure.  Sudden changes in posture, such as standing up quickly after you have been sitting or lying down.  Taking too much of your medicine. SIGNS AND SYMPTOMS   Light-headedness or dizziness.   Fainting or near-fainting.   A fast heart rate.   Weakness.   Feeling tired (fatigue).  DIAGNOSIS  Your health care provider may do several things to help diagnose your condition and identify the cause. These may include:   Taking a medical history and doing a physical exam.  Checking your blood pressure. Your health care provider will check your blood pressure when you are:  Lying down.  Sitting.  Standing.  Using tilt table testing. In this test, you lie down on a table that moves from a lying position to a standing position. You will be strapped onto the table. This test monitors your blood pressure and heart rate when you are in different positions. TREATMENT  Treatment will vary depending on the cause. Possible treatments include:   Changing the dosage of your medicines.  Wearing compression stockings on your lower legs.  Standing up slowly after sitting or lying down.  Eating more salt.  Eating frequent, small meals.  In some cases, getting IV fluids.  Taking medicine to enhance fluid retention. HOME CARE INSTRUCTIONS  Only take  over-the-counter or prescription medicines as directed by your health care provider.  Follow your health care provider's instructions for changing the dosage of your current medicines.  Do not stop or adjust your medicine on your own.  Stand up slowly after sitting or lying down. This allows your body to adjust to the different position.  Wear compression stockings as directed.  Eat extra salt as directed.  Do not add extra salt to your diet unless directed to by your health care provider.  Eat frequent, small meals.  Avoid standing suddenly after eating.  Avoid hot showers or excessive heat as directed by your health care provider.  Keep all follow-up appointments. SEEK MEDICAL CARE IF:  You continue to feel dizzy or light-headed after standing.  You feel groggy or confused.  You feel cold, clammy, or sick to your stomach (nauseous).  You have blurred vision.  You feel short of breath. SEEK IMMEDIATE MEDICAL CARE IF:   You faint after standing.  You have chest pain.  You have difficulty breathing.   You lose feeling or movement in your arms or legs.   You have slurred speech or difficulty talking, or you are unable to talk.  MAKE SURE YOU:   Understand these instructions.  Will watch your condition.  Will get help right away if you are not doing well or get worse. Document Released: 10/30/2002 Document Revised: 11/14/2013 Document Reviewed: 09/01/2013 [facility] Patient Information 2015 [place], [place]. This information is not intended to replace advice given to you by your health care provider. Make sure you  discuss any questions you have with your health care provider. ° °

## 2015-07-18 NOTE — ED Provider Notes (Signed)
CSN: [id number]     Arrival date & time 07/18/15  1044 History   First MD Initiated Contact with Patient 07/18/15 1106     Chief Complaint  Patient presents with  . Dizziness     (Consider location/radiation/quality/duration/timing/severity/associated sxs/prior Treatment) HPI Comments: Pt comes in with cc of dizziness. Pt has hx of advanced CHF with AICD and pacemaker in place. Pt reports that he went to work and got dizzy with some nausea and so he came to the ER. Pt felt that he might pass out. No chest pain, palpitations. No hx of strokes. No numbness, tingling, weakness, vision complains. Pt's sx are worse with standing, but present even when supine.   ROS 10 Systems reviewed and are negative for acute change except as noted in the HPI.      Patient is a 74 y.o. male presenting with dizziness. The history is provided by the patient.  Dizziness   Past Medical History  Diagnosis Date  . Old myocardial infarction     a. ?Silent MI. b. Large fixed inferolateral defect c/w with prior infarct, no ischemia. c. Cath in 2000/2013 with only minimal CAD.  Marland Kitchen Hypertension   . COPD (chronic obstructive pulmonary disease)   . Hyperlipidemia   . Chronic systolic heart failure     EF down to 20 to 25% per echo November 2012  . NICM (nonischemic cardiomyopathy)     a. Normal cors 2000. b. Minimal plaque 2013.  . Pulmonary nodule, right     Last scan in 2010 showing stability; felt to be benign.  . Cardiac defibrillator -dual  [facility]   . Atrial tachycardia-non sustained     a. Noted on 03/2012 interrogation.  Marland Kitchen A-fib     a. Noted on 12/2012 interrogation, placed on Apixaban and ultimately stopped NOACs 2/2 personal decision 8/14.  Marland Kitchen PVC's (premature ventricular contractions)   . ICD (implantable cardiac defibrillator) in place   . Ventricular tachycardia, polymorphic     Rx via ICD 12/14  . Arthritis    Past Surgical History  Procedure Laterality Date  . Colon surgery    .  Cardiac catheterization  2000  . Icd  12/2011    [name]  . Cardiac defibrillator placement    . Appendectomy    . Tee without cardioversion N/A 03/30/2013    Procedure: TRANSESOPHAGEAL ECHOCARDIOGRAM (TEE);  Surgeon: Thayer Headings, MD;  Location: [facility];  Service: Cardiovascular;  Laterality: N/A;  . Cardioversion  03/30/2013    Procedure: CARDIOVERSION;  Surgeon: Thayer Headings, MD;  Location: [facility];  Service: Cardiovascular;;  . Colonoscopy N/A 09/25/2013    Procedure: COLONOSCOPY;  Surgeon: Jerene Bears, MD;  Location: [facility];  Service: Endoscopy;  Laterality: N/A;  . Implantable cardioverter defibrillator implant N/A 01/06/2012    Procedure: IMPLANTABLE CARDIOVERTER DEFIBRILLATOR IMPLANT;  Surgeon: Deboraha Sprang, MD;  Location: [facility] CATH LAB;  Service: Cardiovascular;  Laterality: N/A;   Family History  Problem Relation Age of Onset  . Emphysema Mother   . Heart disease Mother     AVR  . Heart failure Father     Died agwe 12   Social History  Substance Use Topics  . Smoking status: Former Smoker -- 2.50 packs/day for 40 years    Types: Cigarettes    Quit date: 11/24/1983  . Smokeless tobacco: Never Used     Comment: 2 1/2 ppd x 40 years  . Alcohol Use: No    Review of Systems  Neurological: Positive for dizziness.  All other systems reviewed and are negative.     Allergies  Xarelto; Adhesive; Atorvastatin; Codeine; Isosorbide; Latex; Levofloxacin; Lisinopril; Lorazepam; Sertraline; and Tiotropium bromide monohydrate  Home Medications   Prior to Admission medications   Medication Sig Start Date End Date Taking? Authorizing Provider  albuterol (PROVENTIL) (2.5 MG/3ML) 0.083% nebulizer solution Take 3 mLs (2.5 mg total) by nebulization every 6 (six) hours as needed for wheezing or shortness of breath. 01/14/15  Yes Elsie Stain, MD  amiodarone (PACERONE) 200 MG tablet TAKE 1 TABLET (200 MG TOTAL) BY MOUTH DAILY. 05/15/15  Yes Minus Breeding, MD   carvedilol (COREG) 12.5 MG tablet TAKE 1 TABLET (12.5 MG TOTAL) BY MOUTH 2 (TWO) TIMES DAILY WITH A MEAL. 05/29/15  Yes Deboraha Sprang, MD  cholecalciferol (VITAMIN D) 1000 UNITS tablet Take 1,000 Units by mouth daily.   Yes Historical Provider, MD  CRESTOR 10 MG tablet Take 5 mg by mouth daily. 03/06/15  Yes Historical Provider, MD  ELIQUIS 5 MG TABS tablet TAKE 1 TABLET BY MOUTH TWICE A DAY Patient taking differently: TAKE 5 MG BY MOUTH TWICE A DAY 01/29/15  Yes Minus Breeding, MD  finasteride (PROSCAR) 5 MG tablet Take 5 mg by mouth every morning.   Yes Historical Provider, MD  Fluticasone-Salmeterol (ADVAIR DISKUS) 250-50 MCG/DOSE AEPB Inhale 1 puff into the lungs 2 (two) times daily. 07/08/15  Yes Elsie Stain, MD  furosemide (LASIX) 40 MG tablet TAKE 1 TABLET (40 MG TOTAL) BY MOUTH DAILY. 06/03/15  Yes Minus Breeding, MD  levothyroxine (SYNTHROID, LEVOTHROID) 112 MCG tablet Take 112 mcg by mouth daily.    Yes Historical Provider, MD  loratadine (CLARITIN) 10 MG tablet Take 10 mg by mouth daily.   Yes Historical Provider, MD  losartan (COZAAR) 100 MG tablet Take 100 mg by mouth daily.  05/26/14  Yes Historical Provider, MD  Melatonin 5 MG CAPS Take 5 mg by mouth at bedtime as needed (sleep).   Yes Historical Provider, MD  MULTIPLE VITAMINS PO Take 1 tablet by mouth daily.   Yes Historical Provider, MD  nitroGLYCERIN (NITROSTAT) 0.4 MG SL tablet Place 0.4 mg under the tongue every 5 (five) minutes as needed. For chest pain 11/12/11  Yes Burtis Junes, NP  OXYGEN Inhale into the lungs. 2 lpm qhs   Yes Historical Provider, MD  pantoprazole (PROTONIX) 40 MG tablet Take 40 mg by mouth daily.    Yes Historical Provider, MD  PRESCRIPTION MEDICATION Take 2 tablets by mouth once.   Yes Historical Provider, MD  psyllium (METAMUCIL) 58.6 % powder Take 1 packet by mouth daily.    Yes Historical Provider, MD  Tamsulosin HCl (FLOMAX) 0.4 MG CAPS Take 0.4 mg by mouth at bedtime.    Yes Historical Provider, MD    BP 155/85 mmHg  Pulse 60  Temp(Src) 97.9 F (36.6 C) (Oral)  Resp 13  SpO2 90% Physical Exam  Constitutional: He is oriented to person, place, and time. He appears well-developed.  HENT:  Head: Normocephalic and atraumatic.  Eyes: Conjunctivae and EOM are normal. Pupils are equal, round, and reactive to light.  Neck: Normal range of motion. Neck supple.  Cardiovascular: Normal rate and regular rhythm.   Pulmonary/Chest: Effort normal and breath sounds normal. No respiratory distress.  Abdominal: Soft. Bowel sounds are normal. He exhibits no distension. There is no tenderness. There is no rebound and no guarding.  Neurological: He is alert and oriented to person, place, and time.  No cranial nerve deficit. Coordination normal.  Cerebellar exam is normal (finger to nose) Sensory exam normal for bilateral upper and lower extremities - and patient is able to discriminate between sharp and dull. Motor exam is 4+/5   Skin: Skin is warm.  Nursing note and vitals reviewed.   ED Course  Procedures (including critical care time) Labs Review Labs Reviewed  BASIC METABOLIC PANEL - Abnormal; Notable for the following:    Glucose, Bld 186 (*)    Creatinine, Ser 1.29 (*)    Calcium 8.4 (*)    GFR calc non Af Amer 53 (*)    All other components within normal limits  URINALYSIS, ROUTINE W REFLEX MICROSCOPIC (NOT AT [facility]) - Abnormal; Notable for the following:    APPearance CLOUDY (*)    All other components within normal limits  CBG MONITORING, ED - Abnormal; Notable for the following:    Glucose-Capillary 109 (*)    All other components within normal limits  CBC  APTT  PROTIME-INR  I-STAT TROPOININ, ED    Imaging Review No results found. I have personally reviewed and evaluated these images and lab results as part of my medical decision-making.   EKG Interpretation None      MDM   Final diagnoses:  Orthostatic hypotension    Pt comes in with cc of dizziness. His EF is  15% and has an AICD and pacemaker in place. There was no cardiac prodrome, no discharge from the device. ORTHOSTATICS are +.  Pt had symptoms constantly, so he was given gentle rehydration and reassessed at 1:30 and again at 2:30. Post bolus he states that the dizziness had resolved completely, and so we discharged him.   Varney Biles, MD 07/18/15 [phone]

## 2015-07-18 NOTE — ED Notes (Signed)
Discharge instructions given and reviewed with patient.  Patient verbalized understanding to take home medications as directed and to follow up with PMD as needed.  Patient discharged home in good condition.

## 2015-07-18 NOTE — ED Notes (Signed)
Per pt, states he became dizzy and weak around 10 am while at [facility] last checked the end of July-all normal

## 2015-07-30 ENCOUNTER — Other Ambulatory Visit: Payer: Self-pay | Admitting: Cardiology

## 2015-07-30 ENCOUNTER — Ambulatory Visit: Payer: Medicare Other | Admitting: Nurse Practitioner

## 2015-08-27 ENCOUNTER — Other Ambulatory Visit: Payer: Self-pay | Admitting: Critical Care Medicine

## 2015-08-30 ENCOUNTER — Other Ambulatory Visit: Payer: Self-pay | Admitting: *Deleted

## 2015-08-30 MED ORDER — FUROSEMIDE 40 MG PO TABS: 40.0000 mg | ORAL_TABLET | Freq: Every day | ORAL | Status: DC

## 2015-09-17 ENCOUNTER — Other Ambulatory Visit: Payer: Self-pay | Admitting: *Deleted

## 2015-09-17 ENCOUNTER — Telehealth: Payer: Self-pay | Admitting: *Deleted

## 2015-09-17 ENCOUNTER — Encounter: Payer: Managed Care, Other (non HMO) | Admitting: *Deleted

## 2015-09-17 MED ORDER — ALBUTEROL SULFATE 108 (90 BASE) MCG/ACT IN AEPB: 2.0000 | INHALATION_SPRAY | Freq: Four times a day (QID) | RESPIRATORY_TRACT | Status: DC | PRN

## 2015-09-18 ENCOUNTER — Encounter: Payer: Self-pay | Admitting: Cardiology

## 2015-09-23 ENCOUNTER — Other Ambulatory Visit: Payer: Self-pay | Admitting: Internal Medicine

## 2015-09-24 ENCOUNTER — Ambulatory Visit (INDEPENDENT_AMBULATORY_CARE_PROVIDER_SITE_OTHER): Payer: Managed Care, Other (non HMO) | Admitting: *Deleted

## 2015-09-24 DIAGNOSIS — I5022 Chronic systolic (congestive) heart failure: Secondary | ICD-10-CM

## 2015-09-24 DIAGNOSIS — I428 Other cardiomyopathies: Secondary | ICD-10-CM

## 2015-09-24 DIAGNOSIS — I429 Cardiomyopathy, unspecified: Secondary | ICD-10-CM

## 2015-09-25 DIAGNOSIS — E785 Hyperlipidemia, unspecified: Secondary | ICD-10-CM | POA: Diagnosis not present

## 2015-09-25 DIAGNOSIS — I10 Essential (primary) hypertension: Secondary | ICD-10-CM | POA: Diagnosis not present

## 2015-09-25 DIAGNOSIS — I2510 Atherosclerotic heart disease of native coronary artery without angina pectoris: Secondary | ICD-10-CM | POA: Diagnosis not present

## 2015-09-25 DIAGNOSIS — E039 Hypothyroidism, unspecified: Secondary | ICD-10-CM | POA: Diagnosis not present

## 2015-09-26 NOTE — Progress Notes (Signed)
Remote ICD transmission.   

## 2015-10-02 DIAGNOSIS — X32XXXA Exposure to sunlight, initial encounter: Secondary | ICD-10-CM | POA: Diagnosis not present

## 2015-10-02 DIAGNOSIS — D0462 Carcinoma in situ of skin of left upper limb, including shoulder: Secondary | ICD-10-CM | POA: Diagnosis not present

## 2015-10-02 DIAGNOSIS — D225 Melanocytic nevi of trunk: Secondary | ICD-10-CM | POA: Diagnosis not present

## 2015-10-02 DIAGNOSIS — B079 Viral wart, unspecified: Secondary | ICD-10-CM | POA: Diagnosis not present

## 2015-10-02 DIAGNOSIS — L570 Actinic keratosis: Secondary | ICD-10-CM | POA: Diagnosis not present

## 2015-10-11 ENCOUNTER — Encounter: Payer: Self-pay | Admitting: Cardiology

## 2015-10-11 LAB — CUP PACEART REMOTE DEVICE CHECK
Battery Remaining Longevity: 56 mo
Battery Remaining Percentage: 58 %
Battery Voltage: 2.95 V
Brady Statistic AP VP Percent: 1 %
Brady Statistic AS VS Percent: 39 %
HIGH POWER IMPEDANCE MEASURED VALUE: 72 Ohm
HIGH POWER IMPEDANCE MEASURED VALUE: 72 Ohm
Implantable Lead Implant Date: 20130213
Implantable Lead Location: 753859
Lead Channel Impedance Value: 390 Ohm
Lead Channel Impedance Value: 450 Ohm
Lead Channel Pacing Threshold Pulse Width: 0.5 ms
Lead Channel Sensing Intrinsic Amplitude: 12 mV
Lead Channel Sensing Intrinsic Amplitude: 3.3 mV
Lead Channel Setting Pacing Amplitude: 2.5 V
Lead Channel Setting Pacing Pulse Width: 0.6 ms
Lead Channel Setting Sensing Sensitivity: 0.5 mV
MDC IDC LEAD IMPLANT DT: 20130213
MDC IDC LEAD LOCATION: 753860
MDC IDC MSMT LEADCHNL RA PACING THRESHOLD AMPLITUDE: 0.5 V
MDC IDC MSMT LEADCHNL RV PACING THRESHOLD AMPLITUDE: 1 V
MDC IDC MSMT LEADCHNL RV PACING THRESHOLD PULSEWIDTH: 0.6 ms
MDC IDC SESS DTM: 20161101133507
MDC IDC SET LEADCHNL RA PACING AMPLITUDE: 1.5 V
MDC IDC STAT BRADY AP VS PERCENT: 59 %
MDC IDC STAT BRADY AS VP PERCENT: 1 %
MDC IDC STAT BRADY RA PERCENT PACED: 58 %
MDC IDC STAT BRADY RV PERCENT PACED: 1 %
Pulse Gen Serial Number: 7003419

## 2015-10-12 ENCOUNTER — Other Ambulatory Visit: Payer: Self-pay | Admitting: Internal Medicine

## 2015-10-15 ENCOUNTER — Telehealth: Payer: Self-pay | Admitting: Internal Medicine

## 2015-10-15 NOTE — Telephone Encounter (Signed)
Informed pt that we received his remote transmission on 09-24-15. Pt verbalized understanding.

## 2015-10-15 NOTE — Telephone Encounter (Signed)
New Message  Pt has not received confirmation that last transmission was sent successfully. Please call back and discuss.

## 2015-11-20 ENCOUNTER — Telehealth: Payer: Self-pay | Admitting: Critical Care Medicine

## 2015-11-20 DIAGNOSIS — J449 Chronic obstructive pulmonary disease, unspecified: Secondary | ICD-10-CM

## 2015-11-20 NOTE — Telephone Encounter (Signed)
Last ov with PW on 07/08/15  Patient Instructions       No change in medications. Return in             1 year     Called and spoke with pt. Pt would like to switch oxygen from [facility] to the [place] in [place]. He stated that he felt he could get his oxygen cheaper and that the [place] told him all he would need is a rx for oxygen. Pt was last seen by PW. I explained to the patient I would have to send a message to MW before a order could be placed. He voiced understanding and had no further questions. Pt is in for a 1 yr recall but does not have a specific physician.   MW please advise if okay to send DME order for [facility]

## 2015-11-21 NOTE — Telephone Encounter (Signed)
Called and spoke with pt. Informed him of MW's recs. Pt voiced understanding and had no further questions. Order was placed. Nothing further needed.

## 2015-11-21 NOTE — Telephone Encounter (Signed)
That's fine if they'll accept our last eval if not needs to come by for a walking sat on RA and 02 (no ov) or see Dr Gwenette Greet at the [facility] is the other option

## 2015-11-27 DIAGNOSIS — Z85828 Personal history of other malignant neoplasm of skin: Secondary | ICD-10-CM | POA: Diagnosis not present

## 2015-11-27 DIAGNOSIS — L723 Sebaceous cyst: Secondary | ICD-10-CM | POA: Diagnosis not present

## 2015-11-27 DIAGNOSIS — Z08 Encounter for follow-up examination after completed treatment for malignant neoplasm: Secondary | ICD-10-CM | POA: Diagnosis not present

## 2015-11-27 NOTE — Telephone Encounter (Signed)
SPOKE TO Roosvelt Maser about reminder for Mr Watton to send remote transmission she mentioned hell do it later tonight when he returns home

## 2015-11-29 ENCOUNTER — Telehealth: Payer: Self-pay | Admitting: Critical Care Medicine

## 2015-11-29 NOTE — Telephone Encounter (Signed)
Patient says that he spoke with Crystal about getting his oxygen from the [place] [facility].  Patient made an appointment with [facility].  They said that in order for him to get his oxygen from them, he needed to have all of his records from our office brought to his appointment.  Patient advised to contact medical records department so they can print all of those records out for him.  Gave him the number for Medical Records dept at [facility].  Nothing further needed.

## 2015-12-25 ENCOUNTER — Encounter: Payer: Self-pay | Admitting: *Deleted

## 2015-12-26 ENCOUNTER — Encounter: Payer: Self-pay | Admitting: Cardiology

## 2015-12-26 NOTE — Telephone Encounter (Signed)
Pt calling re status of Eliquis being approved, pls advise

## 2015-12-30 ENCOUNTER — Telehealth: Payer: Self-pay | Admitting: Nurse Practitioner

## 2015-12-30 ENCOUNTER — Telehealth: Payer: Self-pay | Admitting: *Deleted

## 2015-12-30 NOTE — Telephone Encounter (Signed)
Left message for pt to call.

## 2015-12-30 NOTE — Telephone Encounter (Signed)
New Message  Pt c/o medication issue: 1. Name of Medication: Eliquis  4. What is your medication issue? Pt states that they are charging him $300. Would need a tier exception. With [employer].

## 2015-12-30 NOTE — Telephone Encounter (Signed)
This encounter was created in error - please disregard.

## 2015-12-30 NOTE — Telephone Encounter (Signed)
Spoke with pt, he is needing call to [employer] regarding his eliquis

## 2015-12-31 NOTE — Telephone Encounter (Signed)
Left a voicemail message for patient to call back. Need current insurance info.

## 2015-12-31 NOTE — Telephone Encounter (Signed)
tiering exception form faxed to [phone].

## 2016-01-01 NOTE — Telephone Encounter (Signed)
Tier Exception request for Eliquis 5 mg sent to [facility].

## 2016-01-14 ENCOUNTER — Telehealth: Payer: Self-pay | Admitting: Internal Medicine

## 2016-01-14 ENCOUNTER — Other Ambulatory Visit: Payer: Self-pay | Admitting: *Deleted

## 2016-01-14 MED ORDER — FUROSEMIDE 40 MG PO TABS: 40.0000 mg | ORAL_TABLET | Freq: Every day | ORAL | Status: DC

## 2016-01-14 NOTE — Telephone Encounter (Signed)
New message  device  4. Are you calling to see if we received your device transmission? Yes

## 2016-01-14 NOTE — Telephone Encounter (Signed)
Spoke with patient. Last transmission 12/28/15. Pt was due to see Dr. [name] in January. I will send a message to [facility] in order to call and arrange an appt. He is Patent attorney.

## 2016-01-22 ENCOUNTER — Other Ambulatory Visit: Payer: Self-pay | Admitting: *Deleted

## 2016-01-22 MED ORDER — FUROSEMIDE 40 MG PO TABS: 40.0000 mg | ORAL_TABLET | Freq: Every day | ORAL | Status: DC

## 2016-02-07 ENCOUNTER — Other Ambulatory Visit: Payer: Self-pay | Admitting: Cardiology

## 2016-02-07 MED ORDER — CARVEDILOL 12.5 MG PO TABS: 12.5000 mg | ORAL_TABLET | Freq: Two times a day (BID) | ORAL | Status: DC

## 2016-03-09 ENCOUNTER — Ambulatory Visit (INDEPENDENT_AMBULATORY_CARE_PROVIDER_SITE_OTHER): Payer: Managed Care, Other (non HMO) | Admitting: Urgent Care

## 2016-03-09 ENCOUNTER — Other Ambulatory Visit: Payer: Self-pay | Admitting: Cardiology

## 2016-03-09 ENCOUNTER — Ambulatory Visit (INDEPENDENT_AMBULATORY_CARE_PROVIDER_SITE_OTHER): Payer: Managed Care, Other (non HMO)

## 2016-03-09 VITALS — BP 126/70 | HR 62 | Temp 97.3°F | Resp 15 | Ht 73.0 in | Wt 209.2 lb

## 2016-03-09 DIAGNOSIS — S8991XA Unspecified injury of right lower leg, initial encounter: Secondary | ICD-10-CM

## 2016-03-09 DIAGNOSIS — S80811A Abrasion, right lower leg, initial encounter: Secondary | ICD-10-CM

## 2016-03-09 DIAGNOSIS — S8011XA Contusion of right lower leg, initial encounter: Secondary | ICD-10-CM | POA: Diagnosis not present

## 2016-03-09 NOTE — Telephone Encounter (Signed)
Rx(s) sent to pharmacy electronically.  

## 2016-03-09 NOTE — Patient Instructions (Addendum)
Please use Tylenol 650mg  every 6-8 hours for pain and inflammation. You may use ice after your shifts for 15-20 minutes.   Contusion A contusion is a deep bruise. Contusions are the result of a blunt injury to tissues and muscle fibers under the skin. The injury causes bleeding under the skin. The skin overlying the contusion may turn blue, purple, or yellow. Minor injuries will give you a painless contusion, but more severe contusions may stay painful and swollen for a few weeks.  CAUSES  This condition is usually caused by a blow, trauma, or direct force to an area of the body. SYMPTOMS  Symptoms of this condition include:  Swelling of the injured area.  Pain and tenderness in the injured area.  Discoloration. The area may have redness and then turn blue, purple, or yellow. DIAGNOSIS  This condition is diagnosed based on a physical exam and medical history. An X-ray, CT scan, or MRI may be needed to determine if there are any associated injuries, such as broken bones (fractures). TREATMENT  Specific treatment for this condition depends on what area of the body was injured. In general, the best treatment for a contusion is resting, icing, applying pressure to (compression), and elevating the injured area. This is often called the RICE strategy. Over-the-counter anti-inflammatory medicines may also be recommended for pain control.  HOME CARE INSTRUCTIONS   Rest the injured area.  If directed, apply ice to the injured area:  Put ice in a plastic bag.  Place a towel between your skin and the bag.  Leave the ice on for 20 minutes, 2-3 times per day.  If directed, apply light compression to the injured area using an elastic bandage. Make sure the bandage is not wrapped too tightly. Remove and reapply the bandage as directed by your health care provider.  If possible, raise (elevate) the injured area above the level of your heart while you are sitting or lying down.  Take  over-the-counter and prescription medicines only as told by your health care provider. SEEK MEDICAL CARE IF:  Your symptoms do not improve after several days of treatment.  Your symptoms get worse.  You have difficulty moving the injured area. SEEK IMMEDIATE MEDICAL CARE IF:   You have severe pain.  You have numbness in a hand or foot.  Your hand or foot turns pale or cold.   This information is not intended to replace advice given to you by your health care provider. Make sure you discuss any questions you have with your health care provider.   Document Released: 08/19/2005 Document Revised: 07/31/2015 Document Reviewed: 03/27/2015 Elsevier Interactive Patient Education 2016 [place] An abrasion is a cut or scrape on the outer surface of your skin. An abrasion does not extend through all of the layers of your skin. It is important to care for your abrasion properly to prevent infection. CAUSES Most abrasions are caused by falling on or gliding across the ground or another surface. When your skin rubs on something, the outer and inner layer of skin rubs off.  SYMPTOMS A cut or scrape is the main symptom of this condition. The scrape may be bleeding, or it may appear red or pink. If there was an associated fall, there may be an underlying bruise. DIAGNOSIS An abrasion is diagnosed with a physical exam. TREATMENT Treatment for this condition depends on how large and deep the abrasion is. Usually, your abrasion will be cleaned with water and mild soap. This  removes any dirt or debris that may be stuck. An antibiotic ointment may be applied to the abrasion to help prevent infection. A bandage (dressing) may be placed on the abrasion to keep it clean. You may also need a tetanus shot. HOME CARE INSTRUCTIONS Medicines  Take or apply medicines only as directed by your health care provider.  If you were prescribed an antibiotic ointment, finish all of it even if you  start to feel better. Wound Care  Clean the wound with mild soap and water 2-3 times per day or as directed by your health care provider. Pat your wound dry with a clean towel. Do not rub it.  There are many different ways to close and cover a wound. Follow instructions from your health care provider about:  Wound care.  Dressing changes and removal.  Check your wound every day for signs of infection. Watch for:  Redness, swelling, or pain.  Fluid, blood, or pus. General Instructions  Keep the dressing dry as directed by your health care provider. Do not take baths, swim, use a hot tub, or do anything that would put your wound underwater until your health care provider approves.  If there is swelling, raise (elevate) the injured area above the level of your heart while you are sitting or lying down.  Keep all follow-up visits as directed by your health care provider. This is important. SEEK MEDICAL CARE IF:  You received a tetanus shot and you have swelling, severe pain, redness, or bleeding at the injection site.  Your pain is not controlled with medicine.  You have increased redness, swelling, or pain at the site of your wound. SEEK IMMEDIATE MEDICAL CARE IF:  You have a red streak going away from your wound.  You have a fever.  You have fluid, blood, or pus coming from your wound.  You notice a bad smell coming from your wound or your dressing.   This information is not intended to replace advice given to you by your health care provider. Make sure you discuss any questions you have with your health care provider.   Document Released: 08/19/2005 Document Revised: 07/31/2015 Document Reviewed: 11/07/2014 Elsevier Interactive Patient Education 2016 [employer].    IF you received an x-ray today, you will receive an invoice from [facility]. Please contact [facility] at [phone] with questions or concerns regarding your invoice.   IF you  received labwork today, you will receive an invoice from [employer]. Please contact [facility] at [phone] with questions or concerns regarding your invoice.   Our billing staff will not be able to assist you with questions regarding bills from these companies.  You will be contacted with the lab results as soon as they are available. The fastest way to get your results is to activate your My Chart account. Instructions are located on the last page of this paperwork. If you have not heard from [place] regarding the results in 2 weeks, please contact this office.

## 2016-03-09 NOTE — Progress Notes (Signed)
    MRN: [id number] DOB: [birth year]  Subjective:   Corey Tyler is a 75 y.o. male presenting for Other  Reports 6 day history of right lower leg injury. Patient was getting into the car, did not lift his leg high enough and made impact with the car over his shin. He had some bleeding which has been controlled since. He has also had pain and some swelling over the area. Denies fever, redness, drainage of pus or bleeding. Patient has not rested, he continues to work at [employer], stands and walks most of his shift. He has also not used anything for pain and inflammation.   Corey Tyler has a current medication list which includes the following prescription(s): albuterol, albuterol sulfate, amiodarone, carvedilol, cholecalciferol, crestor, eliquis, finasteride, fluticasone-salmeterol, furosemide, levothyroxine, loratadine, losartan, melatonin, multiple vitamin, nitroglycerin, oxygen-helium, pantoprazole, PRESCRIPTION MEDICATION, psyllium, and tamsulosin. Also is allergic to xarelto; adhesive; atorvastatin; codeine; isosorbide nitrate; latex; levofloxacin; lisinopril; lorazepam; sertraline; and tiotropium bromide monohydrate.  Corey Tyler  has a past medical history of Old myocardial infarction; Hypertension; COPD (chronic obstructive pulmonary disease) ([place]); Hyperlipidemia; Chronic systolic heart failure (HCC); NICM (nonischemic cardiomyopathy) ([place]); Pulmonary nodule, right; Cardiac defibrillator -dual  [facility]; Atrial tachycardia-non sustained; A-fib ([place]); PVC's (premature ventricular contractions); ICD (implantable cardiac defibrillator) in place; Ventricular tachycardia, polymorphic ([place]); and Arthritis. Also  has past surgical history that includes Colon surgery; Cardiac catheterization (2000); ICD (12/2011); Cardiac defibrillator placement; Appendectomy; TEE without cardioversion (N/A, 03/30/2013); Cardioversion (03/30/2013); Colonoscopy (N/A, 09/25/2013); and implantable cardioverter defibrillator implant (N/A,  01/06/2012).  Objective:   Vitals: BP 126/70 mmHg  Pulse 62  Temp(Src) 97.3 F (36.3 C) (Oral)  Resp 15  Ht 6\' 1"  (1.854 m)  Wt 209 lb 3.2 oz (94.892 kg)  BMI 27.61 kg/m2  SpO2 92%  Physical Exam  Constitutional: He is oriented to person, place, and time. He appears well-developed and well-nourished.  Cardiovascular: Normal rate.   Pulmonary/Chest: Effort normal.  Musculoskeletal:       Right lower leg: He exhibits tenderness (over area depicted) and swelling (trace). He exhibits no bony tenderness, no edema, no deformity and no laceration.       Legs: Neurological: He is alert and oriented to person, place, and time.  Skin: Skin is warm and dry.   Dg Tibia/fibula Right  03/09/2016  CLINICAL DATA:  Right leg injury, initial visit. EXAM: RIGHT TIBIA AND FIBULA - 2 VIEW COMPARISON:  None in PACs FINDINGS: The right tibia and fibula appear adequately mineralized. There is no acute fracture nor dislocation. The observed portions of the right knee and right ankle exhibit no acute bony abnormalities. There is diffuse soft tissue swelling about the ankle. IMPRESSION: No acute or chronic bony abnormality of the right tibia or fibula is observed. Electronically Signed   By: David  Martinique M.D.   On: 03/09/2016 13:31     Assessment and Plan :   1. Right leg injury, initial encounter 2. Contusion of leg, right, initial encounter 3. Leg abrasion, right, initial encounter - Patient was concerned with possible fracture. I reassured patient. He is to rtc in 2 weeks if his symptoms have not yet resolved.  Corey Eagles, PA-C [facility] [phone] 03/09/2016 1:11 PM

## 2016-03-10 ENCOUNTER — Encounter: Payer: Self-pay | Admitting: Internal Medicine

## 2016-03-10 ENCOUNTER — Ambulatory Visit (INDEPENDENT_AMBULATORY_CARE_PROVIDER_SITE_OTHER): Payer: Managed Care, Other (non HMO) | Admitting: Internal Medicine

## 2016-03-10 VITALS — BP 127/75 | HR 66 | Ht 73.0 in | Wt 206.6 lb

## 2016-03-10 DIAGNOSIS — I472 Ventricular tachycardia: Secondary | ICD-10-CM | POA: Diagnosis not present

## 2016-03-10 DIAGNOSIS — I5022 Chronic systolic (congestive) heart failure: Secondary | ICD-10-CM | POA: Diagnosis not present

## 2016-03-10 DIAGNOSIS — Z95810 Presence of automatic (implantable) cardiac defibrillator: Secondary | ICD-10-CM

## 2016-03-10 DIAGNOSIS — I4729 Other ventricular tachycardia: Secondary | ICD-10-CM

## 2016-03-10 DIAGNOSIS — I255 Ischemic cardiomyopathy: Secondary | ICD-10-CM | POA: Diagnosis not present

## 2016-03-10 DIAGNOSIS — I428 Other cardiomyopathies: Secondary | ICD-10-CM

## 2016-03-10 DIAGNOSIS — I429 Cardiomyopathy, unspecified: Secondary | ICD-10-CM

## 2016-03-10 LAB — CUP PACEART INCLINIC DEVICE CHECK
Brady Statistic RA Percent Paced: 58 %
Brady Statistic RV Percent Paced: 1 %
Date Time Interrogation Session: 20170418162201
HIGH POWER IMPEDANCE MEASURED VALUE: 76.5 Ohm
Implantable Lead Location: 753860
Lead Channel Impedance Value: 387.5 Ohm
Lead Channel Pacing Threshold Amplitude: 0.75 V
Lead Channel Pacing Threshold Pulse Width: 0.5 ms
Lead Channel Pacing Threshold Pulse Width: 0.5 ms
Lead Channel Pacing Threshold Pulse Width: 0.6 ms
Lead Channel Setting Sensing Sensitivity: 0.5 mV
MDC IDC LEAD IMPLANT DT: 20130213
MDC IDC LEAD IMPLANT DT: 20130213
MDC IDC LEAD LOCATION: 753859
MDC IDC MSMT BATTERY REMAINING LONGEVITY: 56.4
MDC IDC MSMT LEADCHNL RA IMPEDANCE VALUE: 437.5 Ohm
MDC IDC MSMT LEADCHNL RA PACING THRESHOLD AMPLITUDE: 0.75 V
MDC IDC MSMT LEADCHNL RA SENSING INTR AMPL: 3.4 mV
MDC IDC MSMT LEADCHNL RV PACING THRESHOLD AMPLITUDE: 1 V
MDC IDC MSMT LEADCHNL RV PACING THRESHOLD AMPLITUDE: 1 V
MDC IDC MSMT LEADCHNL RV PACING THRESHOLD PULSEWIDTH: 0.6 ms
MDC IDC MSMT LEADCHNL RV SENSING INTR AMPL: 12 mV
MDC IDC PG SERIAL: 7003419
MDC IDC SET LEADCHNL RA PACING AMPLITUDE: 1.5 V
MDC IDC SET LEADCHNL RV PACING AMPLITUDE: 2.5 V
MDC IDC SET LEADCHNL RV PACING PULSEWIDTH: 0.6 ms

## 2016-03-10 MED ORDER — CEPHALEXIN 500 MG PO CAPS: ORAL_CAPSULE | ORAL | Status: DC

## 2016-03-10 NOTE — Progress Notes (Signed)
Patient Care Team: [name], MD as PCP - General (Internal Medicine) Elsie Stain, MD (Pulmonary Disease)   HPI  Corey Tyler is a 75 y.o. male Seen in followup for ICD implantation 2/13 for primary prevention. This occurred in the context of ischemic/nonischemic cardiac myopathy with underlying bradycardia.    December 2014 he had appropriate ICD discharge for polymorphic ventricular tachycardia.  Subsequent Myoview scan was not gated; it demonstrated a large inferolateral and anterolateral scar without ischemia.  His Coreg was up titrated.  Echo from 2008 demonstrating an EF of 40-45% with inferior/posterior hypokinesis. Cardiac catheterization from 2000 demonstrating normal coronary arteries but he did have a mildly reduced ejection fraction with wall motion abnormality.    Followup cath demonstrated only minimal coronary plaque. Echocardiogram 1/14 demonstrated progressive left ventricular dysfunction with an EF of 15% and wall motion abnormalities in the inferior wall  He also has a history of atrial fibrillation. In the past he was treated with Rivaroxaban which was complicated by GI bleeding. He was then treated with apixaban. He is also been treated with amiodarone. Last available TSH was 8.63. He has treated hypothyroidism. Liver panel was normal; this apparently has been addressed by his PCP who has increased his thyroid replacement. Blood work is again expected the next week or so.   The patient denies chest pain, shortness of breath, nocturnal dyspnea, orthopnea does have some asymmetric edema right greater than left. There is a healing excoriation on the right leg   he adjusts his Lasix on an as-needed basis There have been no palpitations, lightheadedness or syncope.     Past Medical History  Diagnosis Date  . Old myocardial infarction     a. ?Silent MI. b. Large fixed inferolateral defect c/w with prior infarct, no ischemia. c. Cath in 2000/2013 with only  minimal CAD.  Marland Kitchen Hypertension   . COPD (chronic obstructive pulmonary disease) ([place])   . Hyperlipidemia   . Chronic systolic heart failure (HCC)     EF down to 20 to 25% per echo November 2012  . NICM (nonischemic cardiomyopathy) ([place])     a. Normal cors 2000. b. Minimal plaque 2013.  . Pulmonary nodule, right     Last scan in 2010 showing stability; felt to be benign.  . Cardiac defibrillator -dual  [employer]es   . Atrial tachycardia-non sustained     a. Noted on 03/2012 interrogation.  Marland Kitchen A-fib ([place])     a. Noted on 12/2012 interrogation, placed on Apixaban and ultimately stopped NOACs 2/2 personal decision 8/14.  Marland Kitchen PVC's (premature ventricular contractions)   . ICD (implantable cardiac defibrillator) in place   . Ventricular tachycardia, polymorphic ([place])     Rx via ICD 12/14  . Arthritis     Past Surgical History  Procedure Laterality Date  . Colon surgery    . Cardiac catheterization  2000  . Icd  12/2011    [name]  . Cardiac defibrillator placement    . Appendectomy    . Tee without cardioversion N/A 03/30/2013    Procedure: TRANSESOPHAGEAL ECHOCARDIOGRAM (TEE);  Surgeon: Thayer Headings, MD;  Location: [facility];  Service: Cardiovascular;  Laterality: N/A;  . Cardioversion  03/30/2013    Procedure: CARDIOVERSION;  Surgeon: Thayer Headings, MD;  Location: [facility];  Service: Cardiovascular;;  . Colonoscopy N/A 09/25/2013    Procedure: COLONOSCOPY;  Surgeon: Jerene Bears, MD;  [facility];  Service: Endoscopy;  Laterality: N/A;  .  Implantable cardioverter defibrillator implant N/A 01/06/2012    Procedure: IMPLANTABLE CARDIOVERTER DEFIBRILLATOR IMPLANT;  Surgeon: Deboraha Sprang, MD;  [facility] C[facility];  Service: Cardiovascular;  Laterality: N/A;    Current Outpatient Prescriptions  Medication Sig Dispense Refill  . albuterol (PROVENTIL) (2.5 MG/3ML) 0.083% nebulizer solution Take 3 mLs (2.5 mg total) by nebulization every 6 (six) hours as needed for wheezing or  shortness of breath. 75 mL 4  . Albuterol Sulfate (PROAIR RESPICLICK) 123XX123 (90 BASE) MCG/ACT AEPB Inhale 2 puffs into the lungs every 6 (six) hours as needed. 1 each 2  . amiodarone (PACERONE) 200 MG tablet TAKE 1 TABLET (200 MG TOTAL) BY MOUTH DAILY. 90 tablet 3  . carvedilol (COREG) 12.5 MG tablet Take 1 tablet (12.5 mg total) by mouth 2 (two) times daily with a meal. <PLEASE MAKE APPOINTMENT FOR REFILLS> 60 tablet 0  . cholecalciferol (VITAMIN D) 1000 UNITS tablet Take 1,000 Units by mouth daily.    Marland Kitchen ELIQUIS 5 MG TABS tablet TAKE 1 TABLET BY MOUTH TWICE A DAY 60 tablet 5  . finasteride (PROSCAR) 5 MG tablet Take 5 mg by mouth every morning.    . Fluticasone-Salmeterol (ADVAIR DISKUS) 250-50 MCG/DOSE AEPB Inhale 1 puff into the lungs 2 (two) times daily. 60 each 11  . furosemide (LASIX) 40 MG tablet Take 1 tablet (40 mg total) by mouth daily. 90 tablet 1  . levothyroxine (SYNTHROID, LEVOTHROID) 112 MCG tablet Take 112 mcg by mouth daily.     Marland Kitchen loratadine (CLARITIN) 10 MG tablet Take 10 mg by mouth daily.    Marland Kitchen losartan (COZAAR) 100 MG tablet Take 100 mg by mouth daily.     . Melatonin 5 MG CAPS Take 5 mg by mouth at bedtime as needed (sleep).    . MULTIPLE VITAMINS PO Take 1 tablet by mouth daily.    . nitroGLYCERIN (NITROSTAT) 0.4 MG SL tablet Place 0.4 mg under the tongue every 5 (five) minutes as needed. For chest pain    . OXYGEN Inhale into the lungs. 2 lpm qhs    . psyllium (METAMUCIL) 58.6 % powder Take 1 packet by mouth daily.     . Tamsulosin HCl (FLOMAX) 0.4 MG CAPS Take 0.4 mg by mouth at bedtime.      No current facility-administered medications for this visit.    Allergies  Allergen Reactions  . Xarelto [Rivaroxaban] Other (See Comments)    Bleeding  . Adhesive [Tape] Hives, Itching and Rash  . Atorvastatin Other (See Comments)    cough  . Codeine     nauseated  . Isosorbide Nitrate   . Latex     Rash, itching, burning  . Levofloxacin     Tachycardia  . Lisinopril       cough  . Lorazepam     "felt bad"  . Sertraline   . Tiotropium Bromide Monohydrate     Review of Systems negative except from HPI and PMH  Physical Exam BP 127/75 mmHg  Pulse 66  Ht 6\' 1"  (1.854 m)  Wt 206 lb 9.6 oz (93.713 kg)  BMI 27.26 kg/m2 Well developed and nourished in no acute distress HENT normal Neck supple with JVP-flat Clear Regular rate and rhythm, no murmurs or gallops Abd-soft with active BS No Clubbing cyanosis right greater than left plus edema Skin-warm and dry healing excoriation right shin with surrounding erythema A & Oriented  Grossly normal sensory and motor function Device pocket well healed; without hematoma or erythema.  There  is no tethering   ECG ordered today demonstrates sinus rhythm at 63 Intervals 14/11/48 Prior inferior wall MI Nonspecific T-wave changes  Assessment and  Plan  Ventricular tachycardia/fibrillation  Atrial fibrillation  High risk medication surrveillance labs pending from PCP  Cellulitis  Implantable defibrillator-Medtronic  Ischemic/nonischemic cardiomyopathy  Congestive heart failure-chronic-systolic  Mild edema with some asymmetry;  On apixoban for Afib.  Surrounding erythema worrisome for cellulits  Will begin short dose of cefalexin  Without symptoms of ischemia  No intercurrent Ventricular tachycardia  Infrequent afib--known  Corevue reviewed

## 2016-03-10 NOTE — Patient Instructions (Signed)
Medication Instructions: - Start Keflex 500 mg one capsule by mouth three times a day x 5 days  Labwork: - none  Procedures/Testing: - none  Follow-Up: - Remote monitoring is used to monitor your Pacemaker of ICD from home. This monitoring reduces the number of office visits required to check your device to one time per year. It allows [place] to keep an eye on the functioning of your device to ensure it is working properly. You are scheduled for a device check from home on 06/09/16. You may send your transmission at any time that day. If you have a wireless device, the transmission will be sent automatically. After your physician reviews your transmission, you will receive a postcard with your next transmission date.  - Your physician wants you to follow-up in: 6 months with Dr. [name]. You will receive a reminder letter in the mail two months in advance. If you don't receive a letter, please call our office to schedule the follow-up appointment.  Any Additional Special Instructions Will Be Listed Below (If Applicable).     If you need a refill on your cardiac medications before your next appointment, please call your pharmacy.

## 2016-03-24 DIAGNOSIS — I10 Essential (primary) hypertension: Secondary | ICD-10-CM | POA: Diagnosis not present

## 2016-03-24 DIAGNOSIS — Z125 Encounter for screening for malignant neoplasm of prostate: Secondary | ICD-10-CM | POA: Diagnosis not present

## 2016-03-24 DIAGNOSIS — E039 Hypothyroidism, unspecified: Secondary | ICD-10-CM | POA: Diagnosis not present

## 2016-03-31 DIAGNOSIS — E039 Hypothyroidism, unspecified: Secondary | ICD-10-CM | POA: Diagnosis not present

## 2016-03-31 DIAGNOSIS — L570 Actinic keratosis: Secondary | ICD-10-CM | POA: Diagnosis not present

## 2016-03-31 DIAGNOSIS — I2510 Atherosclerotic heart disease of native coronary artery without angina pectoris: Secondary | ICD-10-CM | POA: Diagnosis not present

## 2016-03-31 DIAGNOSIS — E785 Hyperlipidemia, unspecified: Secondary | ICD-10-CM | POA: Diagnosis not present

## 2016-03-31 DIAGNOSIS — I10 Essential (primary) hypertension: Secondary | ICD-10-CM | POA: Diagnosis not present

## 2016-04-01 ENCOUNTER — Telehealth: Payer: Self-pay | Admitting: Critical Care Medicine

## 2016-04-01 DIAGNOSIS — J439 Emphysema, unspecified: Secondary | ICD-10-CM

## 2016-04-01 NOTE — Telephone Encounter (Signed)
OK to see pt in august. [place] for ONO.  AD

## 2016-04-01 NOTE — Telephone Encounter (Signed)
Spoke with Corey Tyler at [facility], states that they need an order for an ONO for insurance purposes to re-qualify him.  Former [facility] patient, recall in system to be set up with any provider in August 2017 for Pulm Follow up.  Please advise Dr Corrie Dandy if you are okay with taking over this patient's care in August 2017 and also sending an order at this time for ONO. Thanks.

## 2016-04-02 ENCOUNTER — Telehealth: Payer: Self-pay | Admitting: Emergency Medicine

## 2016-04-02 DIAGNOSIS — R972 Elevated prostate specific antigen [PSA]: Secondary | ICD-10-CM | POA: Diagnosis not present

## 2016-04-02 NOTE — Telephone Encounter (Signed)
APS closed at time of call.

## 2016-04-02 NOTE — Telephone Encounter (Signed)
Corey Tyler from [facility] called returning Corey Tyler's call. She can be reached at [phone]- [phone]

## 2016-04-02 NOTE — Telephone Encounter (Signed)
Spoke with Jeani Hawking and advised that order for ONO has been placed. Nothing further needed.

## 2016-04-02 NOTE — Telephone Encounter (Signed)
LM with [place] to have Corey Tyler return call.

## 2016-04-03 NOTE — Telephone Encounter (Signed)
Faxed ONO order & npi sheet stamped with RB's signature. Confirmation received from fax. Nothing further needed.

## 2016-04-03 NOTE — Telephone Encounter (Signed)
Returned call to [place] at [facility] order for ONO stamped with RB signature and faxed again.  Sending to Judeen Hammans (spoke on the phone) to fax.

## 2016-04-06 ENCOUNTER — Other Ambulatory Visit: Payer: Self-pay | Admitting: Cardiology

## 2016-04-06 NOTE — Telephone Encounter (Signed)
Rx request sent to pharmacy.  

## 2016-04-07 DIAGNOSIS — N400 Enlarged prostate without lower urinary tract symptoms: Secondary | ICD-10-CM | POA: Diagnosis not present

## 2016-04-07 DIAGNOSIS — R972 Elevated prostate specific antigen [PSA]: Secondary | ICD-10-CM | POA: Diagnosis not present

## 2016-04-07 DIAGNOSIS — N411 Chronic prostatitis: Secondary | ICD-10-CM | POA: Diagnosis not present

## 2016-04-08 ENCOUNTER — Emergency Department (HOSPITAL_COMMUNITY): Payer: Managed Care, Other (non HMO)

## 2016-04-08 ENCOUNTER — Emergency Department (HOSPITAL_COMMUNITY)
Admission: EM | Admit: 2016-04-08 | Discharge: 2016-04-08 | Disposition: A | Discharge status: Home / Self Care | Payer: Managed Care, Other (non HMO) | Attending: Emergency Medicine | Admitting: Emergency Medicine

## 2016-04-08 ENCOUNTER — Encounter (HOSPITAL_COMMUNITY): Payer: Self-pay | Admitting: *Deleted

## 2016-04-08 DIAGNOSIS — Z8739 Personal history of other diseases of the musculoskeletal system and connective tissue: Secondary | ICD-10-CM | POA: Diagnosis not present

## 2016-04-08 DIAGNOSIS — Z95810 Presence of automatic (implantable) cardiac defibrillator: Secondary | ICD-10-CM | POA: Diagnosis not present

## 2016-04-08 DIAGNOSIS — Z87891 Personal history of nicotine dependence: Secondary | ICD-10-CM | POA: Diagnosis not present

## 2016-04-08 DIAGNOSIS — Z91040 Latex allergy status: Secondary | ICD-10-CM | POA: Diagnosis not present

## 2016-04-08 DIAGNOSIS — I5022 Chronic systolic (congestive) heart failure: Secondary | ICD-10-CM | POA: Diagnosis not present

## 2016-04-08 DIAGNOSIS — I252 Old myocardial infarction: Secondary | ICD-10-CM | POA: Diagnosis not present

## 2016-04-08 DIAGNOSIS — J441 Chronic obstructive pulmonary disease with (acute) exacerbation: Secondary | ICD-10-CM | POA: Insufficient documentation

## 2016-04-08 DIAGNOSIS — Z8639 Personal history of other endocrine, nutritional and metabolic disease: Secondary | ICD-10-CM | POA: Diagnosis not present

## 2016-04-08 DIAGNOSIS — I10 Essential (primary) hypertension: Secondary | ICD-10-CM | POA: Insufficient documentation

## 2016-04-08 DIAGNOSIS — R0602 Shortness of breath: Secondary | ICD-10-CM | POA: Diagnosis not present

## 2016-04-08 LAB — BRAIN NATRIURETIC PEPTIDE: B Natriuretic Peptide: 313.8 pg/mL — ABNORMAL HIGH (ref 0.0–100.0)

## 2016-04-08 LAB — CBC WITH DIFFERENTIAL/PLATELET
BASOS ABS: 0 10*3/uL (ref 0.0–0.1)
Basophils Relative: 0 %
EOS ABS: 0.2 10*3/uL (ref 0.0–0.7)
EOS PCT: 2 %
HCT: 44.1 % (ref 39.0–52.0)
Hemoglobin: 14.6 g/dL (ref 13.0–17.0)
LYMPHS PCT: 17 %
Lymphs Abs: 1.6 10*3/uL (ref 0.7–4.0)
MCH: 31.5 pg (ref 26.0–34.0)
MCHC: 33.1 g/dL (ref 30.0–36.0)
MCV: 95 fL (ref 78.0–100.0)
Monocytes Absolute: 0.9 10*3/uL (ref 0.1–1.0)
Monocytes Relative: 9 %
Neutro Abs: 7 10*3/uL (ref 1.7–7.7)
Neutrophils Relative %: 72 %
PLATELETS: 165 10*3/uL (ref 150–400)
RBC: 4.64 MIL/uL (ref 4.22–5.81)
RDW: 14.9 % (ref 11.5–15.5)
WBC: 9.7 10*3/uL (ref 4.0–10.5)

## 2016-04-08 LAB — I-STAT TROPONIN, ED: Troponin i, poc: 0.01 ng/mL (ref 0.00–0.08)

## 2016-04-08 LAB — BASIC METABOLIC PANEL
ANION GAP: 12 (ref 5–15)
BUN: 16 mg/dL (ref 6–20)
CO2: 27 mmol/L (ref 22–32)
Calcium: 8.7 mg/dL — ABNORMAL LOW (ref 8.9–10.3)
Chloride: 102 mmol/L (ref 101–111)
Creatinine, Ser: 1.3 mg/dL — ABNORMAL HIGH (ref 0.61–1.24)
GFR calc non Af Amer: 52 mL/min — ABNORMAL LOW (ref >60)
GFR, EST AFRICAN AMERICAN: 60 mL/min — AB (ref >60)
Glucose, Bld: 91 mg/dL (ref 65–99)
POTASSIUM: 3.7 mmol/L (ref 3.5–5.1)
SODIUM: 141 mmol/L (ref 135–145)

## 2016-04-08 MED ORDER — PREDNISONE 10 MG PO TABS: 60.0000 mg | ORAL_TABLET | Freq: Every day | ORAL | Status: DC

## 2016-04-08 MED ORDER — METHYLPREDNISOLONE SODIUM SUCC 125 MG IJ SOLR
125.0000 mg | Freq: Once | INTRAMUSCULAR | Status: AC
  Administered 2016-04-08: 125 mg via INTRAVENOUS
  Filled 2016-04-08: qty 2

## 2016-04-08 MED ORDER — ALBUTEROL SULFATE (2.5 MG/3ML) 0.083% IN NEBU
5.0000 mg | INHALATION_SOLUTION | Freq: Once | RESPIRATORY_TRACT | Status: AC
  Administered 2016-04-08: 5 mg via RESPIRATORY_TRACT
  Filled 2016-04-08: qty 6

## 2016-04-08 NOTE — Discharge Instructions (Signed)

## 2016-04-08 NOTE — ED Notes (Signed)
Pt is here for generalized weakness with sob which began Friday.  Pt states that last time he felt this way his heart was "out of rhythm".  Pt denies any CP or palpitations and is alert and oriented.

## 2016-04-08 NOTE — ED Provider Notes (Signed)
CSN: [id number]     Arrival date & time 04/08/16  0900 History   First MD Initiated Contact with Patient 04/08/16 [phone]     Chief Complaint  Patient presents with  . Shortness of Breath     HPI Patient presents to the emergency department with increasing generalized weakness and occasional shortness of breath.  Denies chest pain palpitations.  He reports the last time he was like this he felt like his heart was "out of rhythm".  He currently has a dual-chamber pacer and ICD (St. [name]).  He does have a history of COPD with oxygen at night.  He has a history of chronic systolic heart failure but reports no increased weight gain.  Denies increasing orthopnea.  He has a history of A. fib as well as ventricular tachycardia.  He's been compliant with his medications.  Denies lower extremity edema.  He tried his albuterol inhaler this morning without improvement in his symptoms  Past Medical History  Diagnosis Date  . Old myocardial infarction     a. ?Silent MI. b. Large fixed inferolateral defect c/w with prior infarct, no ischemia. c. Cath in 2000/2013 with only minimal CAD.  Marland Kitchen Hypertension   . COPD (chronic obstructive pulmonary disease) ([place])   . Hyperlipidemia   . Chronic systolic heart failure (HCC)     EF down to 20 to 25% per echo November 2012  . NICM (nonischemic cardiomyopathy) ([place])     a. Normal cors 2000. b. Minimal plaque 2013.  . Pulmonary nodule, right     Last scan in 2010 showing stability; felt to be benign.  . Cardiac defibrillator -dual  [employer] Judes   . Atrial tachycardia-non sustained     a. Noted on 03/2012 interrogation.  Marland Kitchen A-fib ([place])     a. Noted on 12/2012 interrogation, placed on Apixaban and ultimately stopped NOACs 2/2 personal decision 8/14.  Marland Kitchen PVC's (premature ventricular contractions)   . ICD (implantable cardiac defibrillator) in place   . Ventricular tachycardia, polymorphic ([place])     Rx via ICD 12/14  . Arthritis    Past Surgical History  Procedure  Laterality Date  . Colon surgery    . Cardiac catheterization  2000  . Icd  12/2011    [name]  . Cardiac defibrillator placement    . Appendectomy    . Tee without cardioversion N/A 03/30/2013    Procedure: TRANSESOPHAGEAL ECHOCARDIOGRAM (TEE);  Surgeon: Thayer Headings, MD;  Location: [facility];  Service: Cardiovascular;  Laterality: N/A;  . Cardioversion  03/30/2013    Procedure: CARDIOVERSION;  Surgeon: Thayer Headings, MD;  Location: [facility];  Service: Cardiovascular;;  . Colonoscopy N/A 09/25/2013    Procedure: COLONOSCOPY;  Surgeon: Jerene Bears, MD;  [facility];  Service: Endoscopy;  Laterality: N/A;  . Implantable cardioverter defibrillator implant N/A 01/06/2012    Procedure: IMPLANTABLE CARDIOVERTER DEFIBRILLATOR IMPLANT;  Surgeon: Deboraha Sprang, MD;  Location: [facility] CATH LAB;  Service: Cardiovascular;  Laterality: N/A;   Family History  Problem Relation Age of Onset  . Emphysema Mother   . Heart disease Mother     AVR  . Heart failure Father     Died agwe 81   Social History  Substance Use Topics  . Smoking status: Former Smoker -- 2.50 packs/day for 40 years    Types: Cigarettes    Quit date: 11/24/1983  . Smokeless tobacco: Never Used     Comment: 2 1/2 ppd x 40 years  .  Alcohol Use: No    Review of Systems  All other systems reviewed and are negative.     Allergies  Xarelto; Adhesive; Atorvastatin; Codeine; Isosorbide nitrate; Latex; Levofloxacin; Lisinopril; Lorazepam; Sertraline; and Tiotropium bromide monohydrate  Home Medications   Prior to Admission medications   Medication Sig Start Date End Date Taking? Authorizing Provider  albuterol (PROVENTIL) (2.5 MG/3ML) 0.083% nebulizer solution Take 3 mLs (2.5 mg total) by nebulization every 6 (six) hours as needed for wheezing or shortness of breath. 01/14/15  Yes Elsie Stain, MD  Albuterol Sulfate (PROAIR RESPICLICK) 123XX123 (90 BASE) MCG/ACT AEPB Inhale 2 puffs into the lungs every 6 (six) hours  as needed. 09/17/15  Yes Tammy S Parrett, NP  amiodarone (PACERONE) 200 MG tablet TAKE 1 TABLET (200 MG TOTAL) BY MOUTH DAILY. 05/15/15  Yes Minus Breeding, MD  carvedilol (COREG) 12.5 MG tablet Take 1 tablet (12.5 mg total) by mouth 2 (two) times daily with a meal. 04/06/16  Yes Minus Breeding, MD  cholecalciferol (VITAMIN D) 1000 UNITS tablet Take 1,000 Units by mouth daily.   Yes Historical Provider, MD  ELIQUIS 5 MG TABS tablet TAKE 1 TABLET BY MOUTH TWICE A DAY 07/30/15  Yes Minus Breeding, MD  finasteride (PROSCAR) 5 MG tablet Take 5 mg by mouth every morning.   Yes Historical Provider, MD  Fluticasone-Salmeterol (ADVAIR DISKUS) 250-50 MCG/DOSE AEPB Inhale 1 puff into the lungs 2 (two) times daily. 07/08/15  Yes Elsie Stain, MD  furosemide (LASIX) 40 MG tablet Take 1 tablet (40 mg total) by mouth daily. 01/22/16  Yes Minus Breeding, MD  levothyroxine (SYNTHROID, LEVOTHROID) 112 MCG tablet Take 112 mcg by mouth daily.    Yes Historical Provider, MD  loratadine (CLARITIN) 10 MG tablet Take 10 mg by mouth daily.   Yes Historical Provider, MD  losartan (COZAAR) 100 MG tablet Take 100 mg by mouth daily.  05/26/14  Yes Historical Provider, MD  Melatonin 5 MG CAPS Take 5 mg by mouth at bedtime as needed (sleep).   Yes Historical Provider, MD  MULTIPLE VITAMINS PO Take 1 tablet by mouth daily.   Yes Historical Provider, MD  nitroGLYCERIN (NITROSTAT) 0.4 MG SL tablet Place 0.4 mg under the tongue every 5 (five) minutes as needed. For chest pain 11/12/11  Yes Burtis Junes, NP  OXYGEN Inhale into the lungs. 2 lpm qhs   Yes Historical Provider, MD  psyllium (METAMUCIL) 58.6 % powder Take 1 packet by mouth daily.    Yes Historical Provider, MD  Tamsulosin HCl (FLOMAX) 0.4 MG CAPS Take 0.4 mg by mouth at bedtime.    Yes Historical Provider, MD  predniSONE (DELTASONE) 10 MG tablet Take 6 tablets (60 mg total) by mouth daily. 04/08/16   Jola Schmidt, MD   BP 123/73 mmHg  Pulse 60  Temp(Src) 97.7 F (36.5  C) (Oral)  Resp 18  Ht 6\' 1"  (1.854 m)  Wt 204 lb (92.534 kg)  BMI 26.92 kg/m2  SpO2 91% Physical Exam  Constitutional: He is oriented to person, place, and time. He appears well-developed and well-nourished.  HENT:  Head: Normocephalic and atraumatic.  Eyes: EOM are normal.  Neck: Normal range of motion.  Cardiovascular: Normal rate, regular rhythm, normal heart sounds and intact distal pulses.   Pulmonary/Chest: Effort normal. No respiratory distress. He has wheezes.  Abdominal: Soft. He exhibits no distension. There is no tenderness.  Musculoskeletal: Normal range of motion.  Neurological: He is alert and oriented to person, place, and time.  Skin: Skin is warm and dry.  Psychiatric: He has a normal mood and affect. Judgment normal.  Nursing note and vitals reviewed.   ED Course  Procedures (including critical care time) Labs Review Labs Reviewed  BASIC METABOLIC PANEL - Abnormal; Notable for the following:    Creatinine, Ser 1.30 (*)    Calcium 8.7 (*)    GFR calc non Af Amer 52 (*)    GFR calc Af Amer 60 (*)    All other components within normal limits  BRAIN NATRIURETIC PEPTIDE - Abnormal; Notable for the following:    B Natriuretic Peptide 313.8 (*)    All other components within normal limits  CBC WITH DIFFERENTIAL/PLATELET  Randolm Idol, ED    Imaging Review Dg Chest 2 View  04/08/2016  CLINICAL DATA:  Shortness of breath and chest pressure for 5 days. Initial encounter. EXAM: CHEST  2 VIEW COMPARISON:  PA and lateral chest 03/18/2014 and 12/22/2013. CT chest 12/12/2008. FINDINGS: AICD is unchanged. The lungs are emphysematous with mild basilar atelectasis. No consolidative process, pneumothorax or effusion. Heart size is normal. IMPRESSION: Emphysema without acute disease. Electronically Signed   By: Inge Rise M.D.   On: 04/08/2016 10:26   I have personally reviewed and evaluated these images and lab results as part of my medical decision-making.    EKG Interpretation   Date/Time:  Wednesday Apr 08 2016 09:08:48 EDT Ventricular Rate:  71 PR Interval:  190 QRS Duration: 111 QT Interval:  441 QTC Calculation: 479 R Axis:   170 Text Interpretation:  Atrial-paced complexes Ventricular premature complex  RVH with secondary repolarization abnrm Anterolateral infarct, age  indeterminate Confirmed by Goddess Gebbia  MD, Lennette Bihari ([id number]) on 04/08/2016 9:17:10  AM Also confirmed by Venora Maples  MD, Lennette Bihari ([id number]), editor Rolla Plate, Joelene Millin  [phone])  on 04/08/2016 10:42:48 AM      MDM   Final diagnoses:  COPD exacerbation ([place])    Patient has had some increased A. fib burden noted on his St. [name] pacer ICD interrogation.  This is likely related somewhat.  Patient does have wheezing on examination and feels better after albuterol and steroids.  Likely some component of this is COPD exacerbation.  Cardiology and pulmonary follow-up.  He understands to return to the ER for new or worsening symptoms.  He ambulated to the emergency department without difficulty and feels better at this time.   Expand All Collapse All   Ambulated Patient approx. 254ft. Resting HR: 61 bpm //// Resting SpO2: 93% Ambulating HR: 69 bpm //// Ambulating SpO2: 94%           Jola Schmidt, MD 04/08/16 [phone]

## 2016-04-08 NOTE — ED Notes (Signed)
Ambulated Patient approx. 238ft. Resting HR: 61 bpm //// Resting SpO2: 93% Ambulating HR: 69 bpm //// Ambulating SpO2: 94%

## 2016-04-09 ENCOUNTER — Ambulatory Visit (INDEPENDENT_AMBULATORY_CARE_PROVIDER_SITE_OTHER): Payer: Managed Care, Other (non HMO) | Admitting: Podiatry

## 2016-04-09 ENCOUNTER — Ambulatory Visit (INDEPENDENT_AMBULATORY_CARE_PROVIDER_SITE_OTHER): Payer: Managed Care, Other (non HMO)

## 2016-04-09 ENCOUNTER — Encounter: Payer: Self-pay | Admitting: Podiatry

## 2016-04-09 VITALS — BP 123/65 | HR 75 | Resp 16 | Ht 73.0 in | Wt 204.0 lb

## 2016-04-09 DIAGNOSIS — M722 Plantar fascial fibromatosis: Secondary | ICD-10-CM

## 2016-04-09 DIAGNOSIS — I255 Ischemic cardiomyopathy: Secondary | ICD-10-CM

## 2016-04-09 MED ORDER — TRIAMCINOLONE ACETONIDE 10 MG/ML IJ SUSP
10.0000 mg | Freq: Once | INTRAMUSCULAR | Status: AC
  Administered 2016-04-09: 10 mg

## 2016-04-09 NOTE — Patient Instructions (Signed)

## 2016-04-09 NOTE — Progress Notes (Signed)
Subjective:     Patient ID: Corey Tyler, male   DOB: 01-Jul-1941, 75 y.o.   MRN: [id number]  HPI patient presents with pain in the plantar aspect of the right heel for 3 months and states he tries to be active but this is made it hard   Review of Systems  All other systems reviewed and are negative.      Objective:   Physical Exam  Constitutional: He is oriented to person, place, and time.  Cardiovascular: Intact distal pulses.   Musculoskeletal: Normal range of motion.  Neurological: He is oriented to person, place, and time.  Skin: Skin is warm.  Nursing note and vitals reviewed.  neurovascular status intact muscle strength adequate range of motion within normal limits with patient found to have exquisite discomfort plantar aspect right heel at the insertional point of the tendon into the calcaneus. There is fluid buildup around the medial band of the tendon mild depression of the arch with patient having good digital perfusion and well oriented 3     Assessment:     Acute plantar fasciitis right heel    Plan:     H&P and x-rays reviewed with patient. Today I injected the right plantar fascia 3 mg Kenalog 5 mg Xylocaine and applied fascial brace with instructions on usage. Patient will do physical therapy at home and will be seen back for [place] to reevaluate  X-ray report indicates there is spur formation with no indications of stress fracture arthritis

## 2016-04-09 NOTE — Progress Notes (Signed)
   Subjective:    Patient ID: Corey Tyler, male    DOB: March 04, 1941, 75 y.o.   MRN: [id number]  HPI Chief Complaint  Patient presents with  . Foot Pain    Right foot; heel; pt stated, "Hurts more when get up in the morning"; x1 month      Review of Systems  HENT: Positive for sinus pressure.   Eyes: Positive for visual disturbance.  Hematological: Bruises/bleeds easily.  All other systems reviewed and are negative.      Objective:   Physical Exam        Assessment & Plan:

## 2016-04-17 ENCOUNTER — Ambulatory Visit: Payer: Managed Care, Other (non HMO) | Admitting: Podiatry

## 2016-04-21 ENCOUNTER — Telehealth: Payer: Self-pay | Admitting: Emergency Medicine

## 2016-04-21 NOTE — Telephone Encounter (Signed)
ONO results >> pt desaturates on room, confirms that he needs to use oxygen at night time.  Pt is aware of results. He is currently using oxygen at night time. Nothing further was needed.

## 2016-04-28 NOTE — Progress Notes (Signed)
HPI The patient presents for follow up of acute on chronic systolic heart failure and atrial fibrillation.  He has had GI bleeding last fall on Xarelto but is tolerating Eliquis.  He had  a TSH of 17.57 probably related to amiodarone and has been treated with synthroid.  He had VTach in Dec 2014  requiring cardioversion from his device. He had a followup perfusion study that demonstrated a previous inferior large fixed defect but no ischemia.    He did have an emergency room visit and I reviewed these records recently. He was more short of breath and was having going on for a while not responded to usual pulmonary treatments. The ER doctor thought he probably had some COPD exacerbation but he was also noted to have more than atrial fibrillation burden and previously. He was treated with steroids and nebulizers and improved. He says since then his breathing has been much better. I do note that his BNP was slightly elevated at 313. However, he says that his weights have been stable. He's not having any new PND or orthopnea. He says he actually doesn't feel his atrial fibrillation think it is been less problematic. He denies any chest pressure, neck or arm discomfort.  Of note he does have some pulmonary nodules that are going to be evaluated with CT. In addition he has had overnight pulse oximetry and this verified that he has desaturations requiring nighttime O2. He is followed by pulmonary.  Allergies  Allergen Reactions  . Xarelto [Rivaroxaban] Other (See Comments)    Bleeding  . Adhesive [Tape] Hives, Itching and Rash  . Atorvastatin Other (See Comments)    cough  . Codeine     nauseated  . Isosorbide Nitrate   . Latex     Rash, itching, burning  . Levofloxacin     Tachycardia  . Lisinopril     cough  . Lorazepam     "felt bad"  . Sertraline   . Tiotropium Bromide Monohydrate     Current Outpatient Prescriptions  Medication Sig Dispense Refill  . albuterol (PROVENTIL) (2.5 MG/3ML)  0.083% nebulizer solution Take 3 mLs (2.5 mg total) by nebulization every 6 (six) hours as needed for wheezing or shortness of breath. 75 mL 4  . Albuterol Sulfate (PROAIR RESPICLICK) 123XX123 (90 BASE) MCG/ACT AEPB Inhale 2 puffs into the lungs every 6 (six) hours as needed. 1 each 2  . amiodarone (PACERONE) 200 MG tablet TAKE 1 TABLET (200 MG TOTAL) BY MOUTH DAILY. 90 tablet 3  . carvedilol (COREG) 12.5 MG tablet Take 1 tablet (12.5 mg total) by mouth 2 (two) times daily with a meal. 60 tablet 0  . cholecalciferol (VITAMIN D) 1000 UNITS tablet Take 1,000 Units by mouth daily.    Marland Kitchen ELIQUIS 5 MG TABS tablet TAKE 1 TABLET BY MOUTH TWICE A DAY 60 tablet 5  . finasteride (PROSCAR) 5 MG tablet Take 5 mg by mouth every morning.    . Fluticasone-Salmeterol (ADVAIR DISKUS) 250-50 MCG/DOSE AEPB Inhale 1 puff into the lungs 2 (two) times daily. 60 each 11  . furosemide (LASIX) 40 MG tablet Take 1 tablet (40 mg total) by mouth daily. 90 tablet 1  . levothyroxine (SYNTHROID, LEVOTHROID) 112 MCG tablet Take 112 mcg by mouth daily.     Marland Kitchen loratadine (CLARITIN) 10 MG tablet Take 10 mg by mouth daily.    Marland Kitchen losartan (COZAAR) 100 MG tablet Take 100 mg by mouth daily.     . Melatonin 5 MG  CAPS Take 5 mg by mouth at bedtime as needed (sleep).    . MULTIPLE VITAMINS PO Take 1 tablet by mouth daily.    . nitroGLYCERIN (NITROSTAT) 0.4 MG SL tablet Place 0.4 mg under the tongue every 5 (five) minutes as needed. For chest pain    . OXYGEN Inhale into the lungs. 2 lpm qhs    . predniSONE (DELTASONE) 10 MG tablet Take 6 tablets (60 mg total) by mouth daily. 30 tablet 0  . psyllium (METAMUCIL) 58.6 % powder Take 1 packet by mouth daily.     . Tamsulosin HCl (FLOMAX) 0.4 MG CAPS Take 0.4 mg by mouth at bedtime.      No current facility-administered medications for this visit.    Past Medical History  Diagnosis Date  . Old myocardial infarction     a. ?Silent MI. b. Large fixed inferolateral defect c/w with prior infarct, no  ischemia. c. Cath in 2000/2013 with only minimal CAD.  Marland Kitchen Hypertension   . COPD (chronic obstructive pulmonary disease) ([place])   . Hyperlipidemia   . Chronic systolic heart failure (HCC)     EF down to 20 to 25% per echo November 2012  . NICM (nonischemic cardiomyopathy) ([place])     a. Normal cors 2000. b. Minimal plaque 2013.  . Pulmonary nodule, right     Last scan in 2010 showing stability; felt to be benign.  . Cardiac defibrillator -dual  [facility]   . Atrial tachycardia-non sustained     a. Noted on 03/2012 interrogation.  Marland Kitchen A-fib ([place])     a. Noted on 12/2012 interrogation, placed on Apixaban and ultimately stopped NOACs 2/2 personal decision 8/14.  Marland Kitchen PVC's (premature ventricular contractions)   . ICD (implantable cardiac defibrillator) in place   . Ventricular tachycardia, polymorphic ([place])     Rx via ICD 12/14  . Arthritis     Past Surgical History  Procedure Laterality Date  . Colon surgery    . Cardiac catheterization  2000  . Icd  12/2011    [name]  . Cardiac defibrillator placement    . Appendectomy    . Tee without cardioversion N/A 03/30/2013    Procedure: TRANSESOPHAGEAL ECHOCARDIOGRAM (TEE);  Surgeon: Thayer Headings, MD;  Location: [facility];  Service: Cardiovascular;  Laterality: N/A;  . Cardioversion  03/30/2013    Procedure: CARDIOVERSION;  Surgeon: Thayer Headings, MD;  Location: [facility];  Service: Cardiovascular;;  . Colonoscopy N/A 09/25/2013    Procedure: COLONOSCOPY;  Surgeon: Jerene Bears, MD;  Location: [facility];  Service: Endoscopy;  Laterality: N/A;  . Implantable cardioverter defibrillator implant N/A 01/06/2012    Procedure: IMPLANTABLE CARDIOVERTER DEFIBRILLATOR IMPLANT;  Surgeon: Deboraha Sprang, MD;  Location: [facility] CATH LAB;  Service: Cardiovascular;  Laterality: N/A;    ROS:  As stated in the HPI and negative for all other systems.   PHYSICAL EXAM BP 147/86 mmHg  Pulse 59  Ht 6\' 1"  (1.854 m)  Wt 208 lb 12.8 oz (94.711 kg)  BMI 27.55  kg/m2 GENERAL:  Well appearing NECK:  No jugular venous distention but hepatojugular reflux noted, waveform within normal limits, carotid upstroke brisk and symmetric, no bruits, no thyromegaly LUNGS:  Clear to auscultation bilaterally BACK:  No CVA tenderness CHEST:  Well healed ICD pocket.   HEART:  PMI not displaced or sustained,S1 and S2 within normal limits, no S3, no S4, no clicks, no rubs, 2/6 holosystolic apical systolic murmur, no diastolic regular ABD:  Flat, positive  bowel sounds normal in frequency in pitch, no bruits, no rebound, no guarding, no midline pulsatile mass, no hepatomegaly, no splenomegaly EXT:  2 plus pulses throughout, mild ankle edema, no cyanosis no clubbing  Lab Results  Component Value Date   TSH 8.63* 04/17/2014   ALT 23 04/17/2014   AST 19 04/17/2014   ALKPHOS 54 04/17/2014   BILITOT 1.1 04/17/2014   PROT 5.9* 04/17/2014   ALBUMIN 3.2* 04/17/2014    ASSESSMENT AND PLAN   Ischemic cardiomyopathy - At this point he thinks he is doing very well. I don't think he seems volume overloaded. He is on a reasonable medical regimen. In the past she's had some renal insufficiency so titrating meds further such as adding spironolactone I think would be problematic. His blood pressure has been lower previously so I will not titrate his beta blocker.   Atrial fibrillation - I will ask his primary provider to see what his last TSH was. I'll make sure he is up-to-date with his liver enzymes as well. If these have not been drawn we'll have to wear them.  HYPERTENSION -  This is being managed in the context of treating his CHF.  It is slightly elevated today but this is particularly unusual.  CAD - The patient has no new sypmtoms.   He had only minimal plaque at the previous catheterization. No change in therapy or further evaluation is indicated.  ER records reviewed.

## 2016-04-29 ENCOUNTER — Encounter: Payer: Self-pay | Admitting: Cardiology

## 2016-04-29 ENCOUNTER — Ambulatory Visit (INDEPENDENT_AMBULATORY_CARE_PROVIDER_SITE_OTHER): Payer: Managed Care, Other (non HMO) | Admitting: Cardiology

## 2016-04-29 ENCOUNTER — Ambulatory Visit (INDEPENDENT_AMBULATORY_CARE_PROVIDER_SITE_OTHER): Payer: Managed Care, Other (non HMO) | Admitting: Podiatry

## 2016-04-29 ENCOUNTER — Encounter: Payer: Self-pay | Admitting: Podiatry

## 2016-04-29 VITALS — BP 147/86 | HR 59 | Ht 73.0 in | Wt 208.8 lb

## 2016-04-29 DIAGNOSIS — M722 Plantar fascial fibromatosis: Secondary | ICD-10-CM

## 2016-04-29 DIAGNOSIS — I255 Ischemic cardiomyopathy: Secondary | ICD-10-CM

## 2016-04-29 DIAGNOSIS — R0602 Shortness of breath: Secondary | ICD-10-CM

## 2016-04-29 DIAGNOSIS — I5022 Chronic systolic (congestive) heart failure: Secondary | ICD-10-CM

## 2016-04-29 MED ORDER — TRIAMCINOLONE ACETONIDE 10 MG/ML IJ SUSP
10.0000 mg | Freq: Once | INTRAMUSCULAR | Status: AC
  Administered 2016-04-29: 10 mg

## 2016-04-29 NOTE — Patient Instructions (Signed)
Your physician recommends that you schedule a follow-up appointment in: 4 Months  

## 2016-04-29 NOTE — Progress Notes (Signed)
Subjective:     Patient ID: Corey Tyler, male   DOB: November 06, 1941, 75 y.o.   MRN: [id number]  HPI patient states I'm still having pain in my heel and I need to be able to stretch it as best as possible   Review of Systems     Objective:   Physical Exam Neurovascular status intact muscle strength adequate with exquisite discomfort plantar aspect right heel still noted    Assessment:     Plantar fasciitis right heel    Plan:     Reinjected the right fascia 3 mg Kenalog 5 mg Xylocaine advised on physical therapy supportive shoes and reappoint to recheck

## 2016-04-30 ENCOUNTER — Telehealth: Payer: Self-pay | Admitting: Internal Medicine

## 2016-04-30 DIAGNOSIS — J438 Other emphysema: Secondary | ICD-10-CM

## 2016-04-30 NOTE — Telephone Encounter (Signed)
Spoke with pt, requesting an order to d/c his 41.  Pt gets this through [facility].  Pt states he does not use his 02, and the [place] is managing him now.   Former PW pt-has not followed up in office with another provider, but [facility] worked with his ONO.   RB please advise if it's ok to place order for 02 pickup.  Thanks!

## 2016-05-04 ENCOUNTER — Other Ambulatory Visit: Payer: Self-pay | Admitting: Cardiology

## 2016-05-04 NOTE — Telephone Encounter (Signed)
Order sent to [facility]  Pt aware and nothing further needed 

## 2016-05-04 NOTE — Telephone Encounter (Signed)
Rx request sent to pharmacy.  

## 2016-05-04 NOTE — Telephone Encounter (Signed)
Yes this is OK 

## 2016-06-03 ENCOUNTER — Ambulatory Visit: Payer: Managed Care, Other (non HMO) | Admitting: Podiatry

## 2016-06-09 ENCOUNTER — Ambulatory Visit (INDEPENDENT_AMBULATORY_CARE_PROVIDER_SITE_OTHER): Payer: Managed Care, Other (non HMO) | Admitting: *Deleted

## 2016-06-09 ENCOUNTER — Telehealth: Payer: Self-pay | Admitting: Cardiology

## 2016-06-09 DIAGNOSIS — I428 Other cardiomyopathies: Secondary | ICD-10-CM

## 2016-06-09 DIAGNOSIS — I429 Cardiomyopathy, unspecified: Secondary | ICD-10-CM | POA: Diagnosis not present

## 2016-06-09 NOTE — Telephone Encounter (Signed)
LMOVM reminding pt to send remote transmission.   

## 2016-06-10 NOTE — Progress Notes (Signed)
Remote ICD transmission.   

## 2016-06-11 ENCOUNTER — Encounter: Payer: Self-pay | Admitting: Cardiology

## 2016-06-12 LAB — CUP PACEART REMOTE DEVICE CHECK
Battery Remaining Longevity: 48 mo
Brady Statistic AP VS Percent: 62 %
Brady Statistic AS VP Percent: 1 %
Brady Statistic AS VS Percent: 36 %
Brady Statistic RV Percent Paced: 3 %
HIGH POWER IMPEDANCE MEASURED VALUE: 77 Ohm
HighPow Impedance: 77 Ohm
Implantable Lead Implant Date: 20130213
Implantable Lead Location: 753859
Implantable Lead Location: 753860
Lead Channel Impedance Value: 400 Ohm
Lead Channel Pacing Threshold Amplitude: 0.5 V
Lead Channel Sensing Intrinsic Amplitude: 12 mV
Lead Channel Setting Pacing Amplitude: 1.5 V
Lead Channel Setting Pacing Amplitude: 2.5 V
MDC IDC LEAD IMPLANT DT: 20130213
MDC IDC MSMT BATTERY REMAINING PERCENTAGE: 52 %
MDC IDC MSMT BATTERY VOLTAGE: 2.93 V
MDC IDC MSMT LEADCHNL RA PACING THRESHOLD PULSEWIDTH: 0.5 ms
MDC IDC MSMT LEADCHNL RA SENSING INTR AMPL: 2.2 mV
MDC IDC MSMT LEADCHNL RV IMPEDANCE VALUE: 340 Ohm
MDC IDC SESS DTM: 20170719143755
MDC IDC SET LEADCHNL RV PACING PULSEWIDTH: 0.6 ms
MDC IDC SET LEADCHNL RV SENSING SENSITIVITY: 0.5 mV
MDC IDC STAT BRADY AP VP PERCENT: 1 %
MDC IDC STAT BRADY RA PERCENT PACED: 57 %
Pulse Gen Serial Number: 7003419

## 2016-07-06 ENCOUNTER — Encounter (HOSPITAL_COMMUNITY): Payer: Self-pay | Admitting: Emergency Medicine

## 2016-07-06 ENCOUNTER — Emergency Department (HOSPITAL_COMMUNITY)
Admission: EM | Admit: 2016-07-06 | Discharge: 2016-07-06 | Disposition: A | Discharge status: Home / Self Care | Payer: Managed Care, Other (non HMO) | Attending: Emergency Medicine | Admitting: Emergency Medicine

## 2016-07-06 ENCOUNTER — Emergency Department (HOSPITAL_COMMUNITY): Payer: Managed Care, Other (non HMO)

## 2016-07-06 DIAGNOSIS — R5383 Other fatigue: Secondary | ICD-10-CM | POA: Diagnosis not present

## 2016-07-06 DIAGNOSIS — Z91040 Latex allergy status: Secondary | ICD-10-CM | POA: Diagnosis not present

## 2016-07-06 DIAGNOSIS — Z87891 Personal history of nicotine dependence: Secondary | ICD-10-CM | POA: Diagnosis not present

## 2016-07-06 DIAGNOSIS — I110 Hypertensive heart disease with heart failure: Secondary | ICD-10-CM | POA: Diagnosis not present

## 2016-07-06 DIAGNOSIS — I5022 Chronic systolic (congestive) heart failure: Secondary | ICD-10-CM | POA: Insufficient documentation

## 2016-07-06 DIAGNOSIS — Z7901 Long term (current) use of anticoagulants: Secondary | ICD-10-CM | POA: Diagnosis not present

## 2016-07-06 DIAGNOSIS — J439 Emphysema, unspecified: Secondary | ICD-10-CM | POA: Diagnosis not present

## 2016-07-06 DIAGNOSIS — Z95810 Presence of automatic (implantable) cardiac defibrillator: Secondary | ICD-10-CM | POA: Insufficient documentation

## 2016-07-06 DIAGNOSIS — J449 Chronic obstructive pulmonary disease, unspecified: Secondary | ICD-10-CM | POA: Insufficient documentation

## 2016-07-06 DIAGNOSIS — E039 Hypothyroidism, unspecified: Secondary | ICD-10-CM | POA: Diagnosis not present

## 2016-07-06 DIAGNOSIS — R110 Nausea: Secondary | ICD-10-CM | POA: Diagnosis not present

## 2016-07-06 DIAGNOSIS — R531 Weakness: Secondary | ICD-10-CM | POA: Diagnosis present

## 2016-07-06 DIAGNOSIS — I252 Old myocardial infarction: Secondary | ICD-10-CM | POA: Diagnosis not present

## 2016-07-06 LAB — URINALYSIS, ROUTINE W REFLEX MICROSCOPIC
Bilirubin Urine: NEGATIVE
GLUCOSE, UA: NEGATIVE mg/dL
Hgb urine dipstick: NEGATIVE
Ketones, ur: NEGATIVE mg/dL
LEUKOCYTES UA: NEGATIVE
NITRITE: NEGATIVE
PH: 6 (ref 5.0–8.0)
Protein, ur: NEGATIVE mg/dL
SPECIFIC GRAVITY, URINE: 1.016 (ref 1.005–1.030)

## 2016-07-06 LAB — BASIC METABOLIC PANEL
ANION GAP: 8 (ref 5–15)
BUN: 15 mg/dL (ref 6–20)
CHLORIDE: 106 mmol/L (ref 101–111)
CO2: 26 mmol/L (ref 22–32)
Calcium: 8.9 mg/dL (ref 8.9–10.3)
Creatinine, Ser: 1.4 mg/dL — ABNORMAL HIGH (ref 0.61–1.24)
GFR calc Af Amer: 55 mL/min — ABNORMAL LOW (ref >60)
GFR, EST NON AFRICAN AMERICAN: 47 mL/min — AB (ref >60)
GLUCOSE: 97 mg/dL (ref 65–99)
POTASSIUM: 3.4 mmol/L — AB (ref 3.5–5.1)
Sodium: 140 mmol/L (ref 135–145)

## 2016-07-06 LAB — CBC
HEMATOCRIT: 41.3 % (ref 39.0–52.0)
HEMOGLOBIN: 13.6 g/dL (ref 13.0–17.0)
MCH: 31.6 pg (ref 26.0–34.0)
MCHC: 32.9 g/dL (ref 30.0–36.0)
MCV: 95.8 fL (ref 78.0–100.0)
Platelets: 218 10*3/uL (ref 150–400)
RBC: 4.31 MIL/uL (ref 4.22–5.81)
RDW: 14.1 % (ref 11.5–15.5)
WBC: 7.4 10*3/uL (ref 4.0–10.5)

## 2016-07-06 LAB — I-STAT TROPONIN, ED: Troponin i, poc: 0 ng/mL (ref 0.00–0.08)

## 2016-07-06 LAB — BRAIN NATRIURETIC PEPTIDE: B NATRIURETIC PEPTIDE 5: 188 pg/mL — AB (ref 0.0–100.0)

## 2016-07-06 NOTE — ED Provider Notes (Signed)
[facility] DEPT Provider Note   CSN: [id number] Arrival date & time: 07/06/16  1045     History   Chief Complaint Chief Complaint  Patient presents with  . Weakness    HPI Corey Tyler is a 75 y.o. male.  Patient is a 7 rolled male with a history of atrial fibrillation on eliquis, nonischemic cardiomyopathy, MI status post defibrillator and atrial pacemaker, hypertension, hyperlipidemia and hypothyroidism who presents today with 2 weeks of generalized weakness. He states he has a general feeling of weakness but also nauseating feeling with an occasional strange taste in his mouth that seems to be worse in the mornings and will spontaneously get slightly better by 6 PM but not completely go away. It has been every day for the last 2 weeks and seems to be getting worse. He denies any new fever, cough, chest pain, palpitations, abdominal pain, vomiting, change in bowel habits. He has not changed any medications recently. He always has some mild swelling in his lower extremities but does not feel that it is any different.  She has been taking all of his medication as prescribed.  His thyroid tests were checked several months ago and were normal and his Synthroid dose has not changed.   The history is provided by the patient and the spouse.  Weakness  This is a new problem. Episode onset: 2 weeks. The problem occurs constantly. The problem has been gradually worsening. Associated symptoms include abdominal pain. Associated symptoms comments: occassional SOB and when bending over and standing up feels like he will pass out. The symptoms are aggravated by walking. Nothing (seems to just spontaneously feel better around 6pm) relieves the symptoms. He has tried nothing for the symptoms. The treatment provided no relief.    Past Medical History:  Diagnosis Date  . A-fib ([place])    a. Noted on 12/2012 interrogation, placed on Apixaban and ultimately stopped NOACs 2/2 personal decision 8/14.  .  Arthritis   . Atrial tachycardia-non sustained    a. Noted on 03/2012 interrogation.  . Cardiac defibrillator -dual  [facility]   . Chronic systolic heart failure (HCC)    EF down to 20 to 25% per echo November 2012  . COPD (chronic obstructive pulmonary disease) ([place])   . Hyperlipidemia   . Hypertension   . ICD (implantable cardiac defibrillator) in place   . NICM (nonischemic cardiomyopathy) ([place])    a. Normal cors 2000. b. Minimal plaque 2013.  . Old myocardial infarction    a. ?Silent MI. b. Large fixed inferolateral defect c/w with prior infarct, no ischemia. c. Cath in 2000/2013 with only minimal CAD.  Marland Kitchen Pulmonary nodule, right    Last scan in 2010 showing stability; felt to be benign.  Marland Kitchen PVC's (premature ventricular contractions)   . Ventricular tachycardia, polymorphic ([place])    Rx via ICD 12/14    Patient Active Problem List   Diagnosis Date Noted  . Benign fibroma of prostate 04/02/2015  . Ventricular tachycardia, polymorphic ([place])   . Diverticulosis of colon without hemorrhage 09/25/2013  . Atrial fibrillation ([place]) 12/29/2012  . Cardiac defibrillator -dual  [facility]   . Hyperlipidemia   . PVCs 11/26/2011  . Chronic systolic heart failure ([place]) 10/07/2011  . Cardiomyopathy, nonischemic and ischemic 09/24/2011  . HYPERTENSION 12/18/2009  . 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 PULMONARY NODULE 12/04/2008    Past Surgical History:  Procedure Laterality Date  . APPENDECTOMY    .  CARDIAC CATHETERIZATION  2000  . CARDIAC DEFIBRILLATOR PLACEMENT    . CARDIOVERSION  03/30/2013   Procedure: CARDIOVERSION;  Surgeon: Thayer Headings, MD;  Location: [facility];  Service: Cardiovascular;;  . COLON SURGERY    . COLONOSCOPY N/A 09/25/2013   Procedure: COLONOSCOPY;  Surgeon: Jerene Bears, MD;  Location: [facility];  Service: Endoscopy;  Laterality: N/A;  . ICD  12/2011   [name]  . IMPLANTABLE CARDIOVERTER DEFIBRILLATOR IMPLANT N/A  01/06/2012   Procedure: IMPLANTABLE CARDIOVERTER DEFIBRILLATOR IMPLANT;  Surgeon: Deboraha Sprang, MD;  Location: [facility] C[facility] LAB;  Service: Cardiovascular;  Laterality: N/A;  . TEE WITHOUT CARDIOVERSION N/A 03/30/2013   Procedure: TRANSESOPHAGEAL ECHOCARDIOGRAM (TEE);  Surgeon: Thayer Headings, MD;  Location: [facility];  Service: Cardiovascular;  Laterality: N/A;       Home Medications    Prior to Admission medications   Medication Sig Start Date End Date Taking? Authorizing Provider  albuterol (PROVENTIL) (2.5 MG/3ML) 0.083% nebulizer solution Take 3 mLs (2.5 mg total) by nebulization every 6 (six) hours as needed for wheezing or shortness of breath. 01/14/15   Elsie Stain, MD  Albuterol Sulfate (PROAIR RESPICLICK) 123XX123 (90 BASE) MCG/ACT AEPB Inhale 2 puffs into the lungs every 6 (six) hours as needed. 09/17/15   Tammy S Parrett, NP  amiodarone (PACERONE) 200 MG tablet TAKE ONE TABLET BY MOUTH ONCE DAILY 05/04/16   Mihai Croitoru, MD  carvedilol (COREG) 12.5 MG tablet TAKE ONE TABLET BY MOUTH TWICE DAILY WITH MEALS 05/04/16   Mihai Croitoru, MD  cholecalciferol (VITAMIN D) 1000 UNITS tablet Take 1,000 Units by mouth daily.    Historical Provider, MD  ELIQUIS 5 MG TABS tablet TAKE 1 TABLET BY MOUTH TWICE A DAY 07/30/15   Minus Breeding, MD  finasteride (PROSCAR) 5 MG tablet Take 5 mg by mouth every morning.    Historical Provider, MD  Fluticasone-Salmeterol (ADVAIR DISKUS) 250-50 MCG/DOSE AEPB Inhale 1 puff into the lungs 2 (two) times daily. 07/08/15   Elsie Stain, MD  furosemide (LASIX) 40 MG tablet Take 1 tablet (40 mg total) by mouth daily. 01/22/16   Minus Breeding, MD  levothyroxine (SYNTHROID, LEVOTHROID) 112 MCG tablet Take 112 mcg by mouth daily.     Historical Provider, MD  loratadine (CLARITIN) 10 MG tablet Take 10 mg by mouth daily.    Historical Provider, MD  losartan (COZAAR) 100 MG tablet Take 100 mg by mouth daily.  05/26/14   Historical Provider, MD  Melatonin 5 MG CAPS Take 5 mg  by mouth at bedtime as needed (sleep).    Historical Provider, MD  MULTIPLE VITAMINS PO Take 1 tablet by mouth daily.    Historical Provider, MD  nitroGLYCERIN (NITROSTAT) 0.4 MG SL tablet Place 0.4 mg under the tongue every 5 (five) minutes as needed. For chest pain 11/12/11   Burtis Junes, NP  OXYGEN Inhale into the lungs. 2 lpm qhs    Historical Provider, MD  predniSONE (DELTASONE) 10 MG tablet Take 6 tablets (60 mg total) by mouth daily. 04/08/16   Jola Schmidt, MD  psyllium (METAMUCIL) 58.6 % powder Take 1 packet by mouth daily.     Historical Provider, MD  Tamsulosin HCl (FLOMAX) 0.4 MG CAPS Take 0.4 mg by mouth at bedtime.     Historical Provider, MD    Family History Family History  Problem Relation Age of Onset  . Emphysema Mother   . Heart disease Mother     AVR  . Heart failure Father  Died agwe 12    Social History Social History  Substance Use Topics  . Smoking status: Former Smoker    Packs/day: 2.50    Years: 40.00    Types: Cigarettes    Quit date: 11/24/1983  . Smokeless tobacco: Never Used     Comment: 2 1/2 ppd x 40 years  . Alcohol use No     Allergies   Xarelto [rivaroxaban]; Adhesive [tape]; Atorvastatin; Codeine; Isosorbide nitrate; Latex; Levofloxacin; Lisinopril; Lorazepam; Sertraline; and Tiotropium bromide monohydrate   Review of Systems Review of Systems  Gastrointestinal: Positive for abdominal pain.  Neurological: Positive for weakness.  All other systems reviewed and are negative.    Physical Exam Updated Vital Signs BP 155/85 (BP Location: Right Arm)   Pulse 60   Temp 98.1 F (36.7 C) (Oral)   Resp 15   SpO2 95%   Physical Exam  Constitutional: He is oriented to person, place, and time. He appears well-developed and well-nourished. No distress.  HENT:  Head: Normocephalic and atraumatic.  Mouth/Throat: Oropharynx is clear and moist.  Eyes: Conjunctivae and EOM are normal. Pupils are equal, round, and reactive to light.    Neck: Normal range of motion. Neck supple.  Cardiovascular: Normal rate, regular rhythm and intact distal pulses.   No murmur heard. Pacemaker present in the left upper chest  Pulmonary/Chest: Effort normal. No respiratory distress. He has no wheezes. He has rales in the right lower field and the left lower field.  Abdominal: Soft. He exhibits no distension. There is no tenderness. There is no rebound and no guarding.  Musculoskeletal: Normal range of motion. He exhibits edema. He exhibits no tenderness.  1+ pitting edema in bilateral lower extremities around the ankles  Neurological: He is alert and oriented to person, place, and time.  Skin: Skin is warm and dry. No rash noted. No erythema.  Psychiatric: He has a normal mood and affect. His behavior is normal.  Nursing note and vitals reviewed.    ED Treatments / Results  Labs (all labs ordered are listed, but only abnormal results are displayed) Labs Reviewed  BASIC METABOLIC PANEL - Abnormal; Notable for the following:       Result Value   Potassium 3.4 (*)    Creatinine, Ser 1.40 (*)    GFR calc non Af Amer 47 (*)    GFR calc Af Amer 55 (*)    All other components within normal limits  CBC  URINALYSIS, ROUTINE W REFLEX MICROSCOPIC (NOT AT [facility])  BRAIN NATRIURETIC PEPTIDE  I-STAT TROPOININ, ED    EKG  EKG Interpretation  Date/Time:  Monday July 06 2016 11:34:14 EDT Ventricular Rate:  60 PR Interval:  208 QRS Duration: 106 QT Interval:  472 QTC Calculation: 472 R Axis:   15 Text Interpretation:  Atrial-paced rhythm Cannot rule out Inferior infarct , age undetermined No significant change since last tracing Confirmed by [facility]  MD, Mariella Blackwelder ([id number]) on 07/06/2016 4:05:11 PM       Radiology Dg Chest 2 View  Result Date: 07/06/2016 CLINICAL DATA:  75 year old male with a history of weakness for 2 weeks EXAM: CHEST  2 VIEW COMPARISON:  04/08/2016, 03/18/2014, 12/22/2013 FINDINGS: Cardiomediastinal silhouette  unchanged. Calcifications of the aortic arch. Unchanged pacing device on the chest wall. Coarsened interstitial markings, similar to the comparison. Architectural distortion bilateral lung bases. Blunting of the left costophrenic angle with obscuration of left hemidiaphragm. IMPRESSION: Similar appearance of the chest x-ray to prior, with changes of emphysema and chronic  architectural distortion and no evidence of lobar pneumonia. Given the appearance of the lungs, following recommendations may apply: Low-dose CT lung cancer screening is recommended for patients who are 50-71 years of age with a 30+ pack-year history of smoking, and who are currently smoking or quit <=15 years ago. Aortic atherosclerosis. Signed, Dulcy Fanny. Earleen Newport, DO [facility] Radiology Electronically Signed   By: Corrie Mckusick D.O.   On: 07/06/2016 13:21    Procedures Procedures (including critical care time)  Medications Ordered in ED Medications - No data to display   Initial Impression / Assessment and Plan / ED Course  I have reviewed the triage vital signs and the nursing notes.  Pertinent labs & imaging results that were available during my care of the patient were reviewed by me and considered in my medical decision making (see chart for details).  Clinical Course   Patient is a 75yo male with vague symptoms of generalized weakness, nausea that seemed to be worse in the morning but a persistent throughout the day. He states by the end of the day at work he is so fatigued that he can barely do anything. This is new in the last 2 weeks. His job has not changed and he has been doing his same routine. He complains of a nauseated feeling but states it's no different with eating or drinking. He has been eating and drinking normally and his bowel movements have not changed. He does have a significant history of prior bowel resection from diverticulitis after a bowel obstruction but  has not had any symptoms related to that.  He denies any chest pain and states he occasionally has to take deeper breaths but does not necessarily feel short of breath. He does not know if he's had change in weight but his clothes are fitting normally. He has not had any recent medication changes. Patient does have a significant history of CHF, cardiac disease in his above bowel issues. UA, CBC and BMP today are unchanged without signs of new renal failure, UTI, anemia as the cause of his symptoms. Concerned that this may be cardiac related however it also could be GI related. Troponin and BNP as well as weight pending. Low concern for PE as this story is not consistent with PE and patient is already anticoagulated. Patient does take omeprazole daily which he has been doing for years but could still be GI especially with the strange taste in his mouth that he also is getting.  Patient does have a murmur and he could have worsening valve insufficiency which may be causing his symptoms all labs are normal today.  No sign of CHF or heart strain. Patient is otherwise well-appearing and feel that he is safe for discharge but recommended following up with his cardiologist for a new echo as it has been a long time since his last echo and then increasing his omeprazole to twice a day and following up with GI if symptoms do not improve. Final Clinical Impressions(s) / ED Diagnoses   Final diagnoses:  Other fatigue  Nausea    New Prescriptions Current Discharge Medication List       Blanchie Dessert, MD 07/06/16 1859

## 2016-07-06 NOTE — ED Notes (Signed)
Gave pt [place] sandwich and gingerale, per Dr. Maryan Rued.

## 2016-07-06 NOTE — ED Triage Notes (Signed)
Generalized weakness for two weeks. Increased SOB with normal activities gradually increasing. Denies fevers/chills. Denies pain.

## 2016-07-06 NOTE — ED Notes (Signed)
PT is in stable condition upon d/c and ambulates from ED. 

## 2016-07-08 ENCOUNTER — Encounter: Payer: Self-pay | Admitting: Physician Assistant

## 2016-07-08 ENCOUNTER — Telehealth: Payer: Self-pay | Admitting: Cardiology

## 2016-07-08 DIAGNOSIS — R600 Localized edema: Secondary | ICD-10-CM

## 2016-07-08 DIAGNOSIS — Z23 Encounter for immunization: Secondary | ICD-10-CM | POA: Diagnosis not present

## 2016-07-08 DIAGNOSIS — R5383 Other fatigue: Secondary | ICD-10-CM

## 2016-07-08 DIAGNOSIS — R011 Cardiac murmur, unspecified: Secondary | ICD-10-CM

## 2016-07-08 DIAGNOSIS — K219 Gastro-esophageal reflux disease without esophagitis: Secondary | ICD-10-CM | POA: Diagnosis not present

## 2016-07-08 DIAGNOSIS — I480 Paroxysmal atrial fibrillation: Secondary | ICD-10-CM

## 2016-07-08 DIAGNOSIS — R110 Nausea: Secondary | ICD-10-CM | POA: Diagnosis not present

## 2016-07-08 DIAGNOSIS — I428 Other cardiomyopathies: Secondary | ICD-10-CM

## 2016-07-08 NOTE — Telephone Encounter (Signed)
Echo ordered per hospital discharge notes Message routed to R. Griffin to schedule

## 2016-07-08 NOTE — Telephone Encounter (Signed)
New message     Pt stated that he just got out of the hospital and per his instructions he is suppose to have an ECHO. Please advise.

## 2016-07-08 NOTE — Telephone Encounter (Signed)
Spoke w/ pt and requested that he send a manual transmission b/c his home monitor has not updated in at least 8 days.   

## 2016-07-21 ENCOUNTER — Other Ambulatory Visit (INDEPENDENT_AMBULATORY_CARE_PROVIDER_SITE_OTHER): Payer: Managed Care, Other (non HMO)

## 2016-07-21 ENCOUNTER — Encounter: Payer: Self-pay | Admitting: Physician Assistant

## 2016-07-21 ENCOUNTER — Ambulatory Visit (INDEPENDENT_AMBULATORY_CARE_PROVIDER_SITE_OTHER): Payer: Medicare Other | Admitting: Physician Assistant

## 2016-07-21 VITALS — BP 120/80 | HR 70 | Ht 73.0 in | Wt 206.0 lb

## 2016-07-21 DIAGNOSIS — R1012 Left upper quadrant pain: Secondary | ICD-10-CM | POA: Diagnosis not present

## 2016-07-21 DIAGNOSIS — I509 Heart failure, unspecified: Secondary | ICD-10-CM

## 2016-07-21 DIAGNOSIS — R1011 Right upper quadrant pain: Secondary | ICD-10-CM | POA: Diagnosis not present

## 2016-07-21 DIAGNOSIS — R110 Nausea: Secondary | ICD-10-CM | POA: Diagnosis not present

## 2016-07-21 DIAGNOSIS — I255 Ischemic cardiomyopathy: Secondary | ICD-10-CM | POA: Diagnosis not present

## 2016-07-21 LAB — BASIC METABOLIC PANEL
BUN: 15 mg/dL (ref 6–23)
CHLORIDE: 106 meq/L (ref 96–112)
CO2: 31 meq/L (ref 19–32)
Calcium: 8.9 mg/dL (ref 8.4–10.5)
Creatinine, Ser: 1.13 mg/dL (ref 0.40–1.50)
GFR: 67 mL/min (ref >60.00)
GLUCOSE: 109 mg/dL — AB (ref 70–99)
POTASSIUM: 4.4 meq/L (ref 3.5–5.1)
SODIUM: 141 meq/L (ref 135–145)

## 2016-07-21 MED ORDER — PANTOPRAZOLE SODIUM 40 MG PO TBEC: 40.0000 mg | DELAYED_RELEASE_TABLET | Freq: Every day | ORAL | 11 refills | Status: DC

## 2016-07-21 MED ORDER — PROMETHAZINE HCL 25 MG PO TABS: 25.0000 mg | ORAL_TABLET | Freq: Four times a day (QID) | ORAL | 3 refills | Status: DC | PRN

## 2016-07-21 NOTE — Patient Instructions (Signed)
Please go to the basement level to have your labs drawn.   We have sent the following medications to your pharmacy for you to pick up at your convenience:phenergan.   We have printed the prescription for pantoprazole for you to take to the [place].  Your physician has requested that you go to the basement for the following lab work before leaving today: Bmet.   You have been scheduled for a CT scan of the abdomen and pelvis at [facility] ([address]---this is in the same building as Press [employer]).   You are scheduled on Friday 07-31-2016 at 2:00 PM. You should arrive at 1:45 PM   to your appointment time for registration. Please follow the written instructions below on the day of your exam:  WARNING: IF YOU ARE ALLERGIC TO IODINE/X-RAY DYE, PLEASE NOTIFY RADIOLOGY IMMEDIATELY AT [id number]! YOU WILL BE GIVEN A 13 HOUR PREMEDICATION PREP.  1) Do not eat  anything after  (4 hours prior to your test) 2) You have been given 2 bottles of oral contrast to drink. The solution may taste               better if refrigerated, but do NOT add ice or any other liquid to this solution. Shake             well before drinking.    Drink 1 bottle of contrast @ Noon 12:00  (2 hours prior to your exam)  Drink 1 bottle of contrast @ 1:00 PM (1 hour prior to your exam)  You may take any medications as prescribed with a small amount of water except for the following: Metformin, Glucophage, Glucovance, Avandamet, Riomet, Fortamet, Actoplus Met, Janumet, Glumetza or Metaglip. The above medications must be held the day of the exam AND 48 hours after the exam.  The purpose of you drinking the oral contrast is to aid in the visualization of your intestinal tract. The contrast solution may cause some diarrhea. Before your exam is started, you will be given a small amount of fluid to drink. Depending on your individual set of symptoms, you may also receive an intravenous injection of x-ray contrast/dye.  Plan on being at [facility] for 30 minutes or longer, depending on the type of exam you are having performed.  This test typically takes 30-45 minutes to complete.  If you have any questions regarding your exam or if you need to reschedule, you may call the CT department at [phone] between the hours of 8:00 am and 5:00 pm, Monday-Friday.  ________________________________________________________________________

## 2016-07-21 NOTE — Progress Notes (Addendum)
Subjective:    Patient ID: Corey Tyler, male    DOB: 1941-05-26, 74 y.o.   MRN: [id number]  HPI  Corey Tyler  Is a pleasant 75 year old white male known to Dr. Hilarie Fredrickson. He is referred today by Dr. Shelia Media  for evaluation of nausea, and possible acid reflux. Patient was last seen in 2014 when he underwent colonoscopy which showed a prior anastomosis, and moderate pandiverticulosis. He has not had prior EGD Patient has history of a severe cardiomyopathy with EF of 20-25%, is followed by Dr. Percival Spanish. He also has history of V. tach, COPD Gold C, atrial fibrillation, coronary artery disease, and hypertension. Patient relates that over the past couple of months he has had problems with intermittent weakness and nausea. He still works part-time at [employer] and says that frequently when he is walking the aisles at the store he will have to stop  and rest  due to onset of weakness. He says frequently with the sensation of weakness he gets queasy but never vomits. Over the past several weeks he has been having more frequent episodes of nausea. He is sometimes waking up early in the morning hours at 3 or 4 AM feeling nauseated and somewhat weak. He gets up and takes and omeprazole and Phenergan and will generally feel a bit better. He says he also feels better with food on his stomach. He has not had any vomiting his appetite is okay and his weight has remained stable. He denies any dysphagia or odynophagia no heartburn or indigestion. He denies any abdominal pain and has not had any changes in bowel habits. He is not on any aspirin or NSAIDs and has not started any new medications. In past years he had been on pantoprazole for reflux type symptoms which worked well but his insurance stopped paying for it.  He is currently taking over-the-counter omeprazole and doesn't find it very effective. Patient is scheduled for a 2-D echo tomorrow. He says he has wondered whether his weakness and nausea are related to his  heart.  Review of Systems Pertinent positive and negative review of systems were noted in the above HPI section.  All other review of systems was otherwise negative.  Outpatient Encounter Prescriptions as of 07/21/2016  Medication Sig  . albuterol (PROVENTIL) (2.5 MG/3ML) 0.083% nebulizer solution Take 3 mLs (2.5 mg total) by nebulization every 6 (six) hours as needed for wheezing or shortness of breath.  . Albuterol Sulfate (PROAIR RESPICLICK) 123XX123 (90 BASE) MCG/ACT AEPB Inhale 2 puffs into the lungs every 6 (six) hours as needed.  Marland Kitchen amiodarone (PACERONE) 200 MG tablet TAKE ONE TABLET BY MOUTH ONCE DAILY  . carvedilol (COREG) 12.5 MG tablet TAKE ONE TABLET BY MOUTH TWICE DAILY WITH MEALS  . cholecalciferol (VITAMIN D) 1000 UNITS tablet Take 1,000 Units by mouth daily.  Marland Kitchen ELIQUIS 5 MG TABS tablet TAKE 1 TABLET BY MOUTH TWICE A DAY  . finasteride (PROSCAR) 5 MG tablet Take 5 mg by mouth every morning.  . Fluticasone-Salmeterol (ADVAIR DISKUS) 250-50 MCG/DOSE AEPB Inhale 1 puff into the lungs 2 (two) times daily.  . furosemide (LASIX) 40 MG tablet Take 1 tablet (40 mg total) by mouth daily.  Marland Kitchen levothyroxine (SYNTHROID, LEVOTHROID) 112 MCG tablet Take 112 mcg by mouth daily.   Marland Kitchen loratadine (CLARITIN) 10 MG tablet Take 10 mg by mouth daily.  Marland Kitchen losartan (COZAAR) 100 MG tablet Take 100 mg by mouth daily.   . Melatonin 5 MG CAPS Take 5 mg by mouth  at bedtime as needed (sleep).  . MULTIPLE VITAMINS PO Take 1 tablet by mouth daily.  . nitroGLYCERIN (NITROSTAT) 0.4 MG SL tablet Place 0.4 mg under the tongue every 5 (five) minutes as needed. For chest pain  . OXYGEN Inhale into the lungs. 2 lpm qhs  . predniSONE (DELTASONE) 10 MG tablet Take 6 tablets (60 mg total) by mouth daily.  . promethazine (PHENERGAN) 25 MG tablet Take 1 tablet (25 mg total) by mouth every 6 (six) hours as needed for nausea or vomiting.  . psyllium (METAMUCIL) 58.6 % powder Take 1 packet by mouth daily.   . Tamsulosin HCl (FLOMAX)  0.4 MG CAPS Take 0.4 mg by mouth at bedtime.   . [DISCONTINUED] promethazine (PHENERGAN) 25 MG tablet Take 25 mg by mouth every 6 (six) hours as needed for nausea or vomiting.  . pantoprazole (PROTONIX) 40 MG tablet Take 1 tablet (40 mg total) by mouth daily.  . [DISCONTINUED] pantoprazole (PROTONIX) 40 MG tablet Take 1 tablet (40 mg total) by mouth daily.   No facility-administered encounter medications on file as of 07/21/2016.    Allergies  Allergen Reactions  . Xarelto [Rivaroxaban] Other (See Comments)    Bleeding  . Adhesive [Tape] Hives, Itching and Rash  . Atorvastatin Other (See Comments)    cough  . Codeine     nauseated  . Isosorbide Nitrate   . Latex     Rash, itching, burning  . Levofloxacin     Tachycardia  . Lisinopril     cough  . Lorazepam     "felt bad"  . Sertraline   . Tiotropium Bromide Monohydrate    Patient Active Problem List   Diagnosis Date Noted  . Benign fibroma of prostate 04/02/2015  . Ventricular tachycardia, polymorphic ([place])   . Diverticulosis of colon without hemorrhage 09/25/2013  . Atrial fibrillation ([place]) 12/29/2012  . Cardiac defibrillator -dual  [employer]   . Hyperlipidemia   . PVCs 11/26/2011  . Chronic systolic heart failure ([place]) 10/07/2011  . Cardiomyopathy, nonischemic and ischemic 09/24/2011  . HYPERTENSION 12/18/2009  . 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 PULMONARY NODULE 12/04/2008   Social History   Social History  . Marital status: Married    Spouse name: N/A  . Number of children: 2  . Years of education: N/A   Occupational History  . Retired     Press photographer  . Works [place] History Main Topics  . Smoking status: Former Smoker    Packs/day: 2.50    Years: 40.00    Types: Cigarettes    Quit date: 11/24/1983  . Smokeless tobacco: Never Used     Comment: 2 1/2 ppd x 40 years  . Alcohol use No  . Drug use: No  . Sexual activity: Not on file   Other  Topics Concern  . Not on file   Social History Narrative   Still works at [employer] part-time   Lives with Wife in [place]    Mr. Souder family history includes Emphysema in his mother; Heart disease in his mother; Heart failure in his father.      Objective:    Vitals:   07/21/16 0843  BP: 120/80  Pulse: 70    Physical Exam   well-developed older white male in no acute distress, accompanied by his wife, pleasant blood pressure 120/80 pulse 70 height 6 foot 1 weight 206 BMI 27.1. HEENT; nontraumatic, cephalic EOMI PERRLA  sclera anicteric, neck ;supple no JVD Cardiovascular; regular rate and rhythm with [id number] soft systolic murmur, Pulmonary; clear bilaterally, Abdomen ;soft nontender nondistended no palpable mass or hepatosplenomegaly, no bruit bowel sounds are present, Rectal; exam not done, Extremities; no clubbing cyanosis or edema skin warm and dry, Neuropsych ;mood and affect appropriate      Assessment & Plan:   #1 75 yo WM with 2 month history of intermittent episodes of weakness and nausea and few week history of intermittent early a.m. awakening with nausea and weakness. This is in the setting of known ischemic and nonischemic cardiomyopathy with previous EF documented at 20-25%. Patient may be having symptoms secondary to low-flow state specially if he has had a change in his EF. We will rule out symptomatic cholelithiasis, currently doubt symptoms secondary to acid reflux, rule out occult malignancy, gastric lesion. #2 Pan diverticulosis #3 COPD #4 history of V. tach #5 coronary artery disease #6 hypertension   #7 atrial fibrillation  Plan; Patient is scheduled for echo and cardiac eval tomorrow He is high risk for complications with sedation given his severe cardiomyopathy and procedures would require scheduling at Hospital. At this time would prefer to avoid endoscopic evaluation. Will schedule for CT of the abdomen and pelvis with contrast Check bmet  today Have refilled Phenergan 25 mg every 6-8 hours for when necessary use We will also switch him back to pantoprazole 40 mg by mouth every morning which he can get filled at the [facility] cheaply, he'll stop omeprazole. Bland diet We'll schedule office follow-up with Dr. Hilarie Fredrickson in 4-5 weeks   Aurelius Gildersleeve Genia Harold PA-C 07/21/2016   Cc: [name], MD  Addendum: Reviewed and agree with initial management. Jerene Bears, MD

## 2016-07-22 ENCOUNTER — Other Ambulatory Visit: Payer: Self-pay

## 2016-07-22 ENCOUNTER — Ambulatory Visit (HOSPITAL_COMMUNITY): Payer: Managed Care, Other (non HMO) | Attending: Cardiovascular Disease

## 2016-07-22 DIAGNOSIS — I429 Cardiomyopathy, unspecified: Secondary | ICD-10-CM

## 2016-07-22 DIAGNOSIS — R5383 Other fatigue: Secondary | ICD-10-CM | POA: Insufficient documentation

## 2016-07-22 DIAGNOSIS — I340 Nonrheumatic mitral (valve) insufficiency: Secondary | ICD-10-CM | POA: Diagnosis not present

## 2016-07-22 DIAGNOSIS — R600 Localized edema: Secondary | ICD-10-CM | POA: Diagnosis not present

## 2016-07-22 DIAGNOSIS — I119 Hypertensive heart disease without heart failure: Secondary | ICD-10-CM | POA: Insufficient documentation

## 2016-07-22 DIAGNOSIS — I428 Other cardiomyopathies: Secondary | ICD-10-CM | POA: Insufficient documentation

## 2016-07-22 DIAGNOSIS — J449 Chronic obstructive pulmonary disease, unspecified: Secondary | ICD-10-CM | POA: Diagnosis not present

## 2016-07-22 DIAGNOSIS — I351 Nonrheumatic aortic (valve) insufficiency: Secondary | ICD-10-CM | POA: Diagnosis not present

## 2016-07-22 DIAGNOSIS — I480 Paroxysmal atrial fibrillation: Secondary | ICD-10-CM | POA: Diagnosis not present

## 2016-07-22 DIAGNOSIS — I071 Rheumatic tricuspid insufficiency: Secondary | ICD-10-CM | POA: Insufficient documentation

## 2016-07-22 DIAGNOSIS — R011 Cardiac murmur, unspecified: Secondary | ICD-10-CM | POA: Insufficient documentation

## 2016-07-22 DIAGNOSIS — I313 Pericardial effusion (noninflammatory): Secondary | ICD-10-CM | POA: Insufficient documentation

## 2016-07-22 DIAGNOSIS — I2510 Atherosclerotic heart disease of native coronary artery without angina pectoris: Secondary | ICD-10-CM | POA: Diagnosis not present

## 2016-07-22 DIAGNOSIS — I4891 Unspecified atrial fibrillation: Secondary | ICD-10-CM | POA: Diagnosis present

## 2016-07-28 DIAGNOSIS — I700 Atherosclerosis of aorta: Secondary | ICD-10-CM | POA: Diagnosis not present

## 2016-07-28 DIAGNOSIS — N281 Cyst of kidney, acquired: Secondary | ICD-10-CM | POA: Diagnosis not present

## 2016-07-28 DIAGNOSIS — R1011 Right upper quadrant pain: Secondary | ICD-10-CM | POA: Diagnosis not present

## 2016-07-28 DIAGNOSIS — M899 Disorder of bone, unspecified: Secondary | ICD-10-CM | POA: Diagnosis not present

## 2016-07-28 DIAGNOSIS — R110 Nausea: Secondary | ICD-10-CM | POA: Diagnosis not present

## 2016-07-28 DIAGNOSIS — I714 Abdominal aortic aneurysm, without rupture: Secondary | ICD-10-CM | POA: Diagnosis not present

## 2016-07-28 DIAGNOSIS — I509 Heart failure, unspecified: Secondary | ICD-10-CM | POA: Diagnosis not present

## 2016-07-28 DIAGNOSIS — K5750 Diverticulosis of both small and large intestine without perforation or abscess without bleeding: Secondary | ICD-10-CM | POA: Diagnosis not present

## 2016-07-28 DIAGNOSIS — R1012 Left upper quadrant pain: Secondary | ICD-10-CM | POA: Diagnosis not present

## 2016-07-28 DIAGNOSIS — J432 Centrilobular emphysema: Secondary | ICD-10-CM | POA: Diagnosis not present

## 2016-07-28 DIAGNOSIS — Z8719 Personal history of other diseases of the digestive system: Secondary | ICD-10-CM | POA: Diagnosis not present

## 2016-07-28 DIAGNOSIS — R911 Solitary pulmonary nodule: Secondary | ICD-10-CM | POA: Diagnosis not present

## 2016-07-28 DIAGNOSIS — M47815 Spondylosis without myelopathy or radiculopathy, thoracolumbar region: Secondary | ICD-10-CM | POA: Diagnosis not present

## 2016-07-31 ENCOUNTER — Other Ambulatory Visit: Payer: Managed Care, Other (non HMO)

## 2016-07-31 ENCOUNTER — Telehealth: Payer: Self-pay

## 2016-07-31 NOTE — Telephone Encounter (Signed)
-----   Message from Alfredia Ferguson, PA-C sent at 07/30/2016  5:06 PM EDT ----- Regarding: Bentsen Andrae Please call pt and let him know the Ct scan does not show any abnormality to account for his nausea, and abdominal  Discomfort. He does have a small aortic aneurysm -it was recommended to be followed by CT in a couple years.  I still think his sxs may be related to his heart failure  I hope he is feeling better- continue Phenergan and protonix, and keep appt with Dr Hilarie Fredrickson  Please convert this to a phone note so will be part of his record.

## 2016-07-31 NOTE — Telephone Encounter (Signed)
Patient is advised of the results

## 2016-08-05 ENCOUNTER — Telehealth: Payer: Self-pay | Admitting: Cardiology

## 2016-08-05 NOTE — Telephone Encounter (Signed)
Spoke w/ pt wife and requested that pt send a manual transmission w/ his home monitor b/c his home monitor has not updated in at least 8 days. Pt wife verbalized understanding and said she would have pt do it tonight when he gets home from work.

## 2016-08-06 ENCOUNTER — Telehealth: Payer: Self-pay | Admitting: Cardiology

## 2016-08-06 NOTE — Telephone Encounter (Signed)
Attempt to return call, no answer-lmtcb.

## 2016-08-06 NOTE — Telephone Encounter (Signed)
New message ° ° ° ° ° ° °Pt returning nurse call  °

## 2016-08-12 NOTE — Telephone Encounter (Signed)
msg left for patient to call. 

## 2016-08-12 NOTE — Telephone Encounter (Signed)
This may be in regards to an echo from august - Nya called pt 2 weeks ago, Dr. Percival Spanish recommended office f/u  Pt scheduled return visit for 10/17 w Dr. Percival Spanish

## 2016-08-19 ENCOUNTER — Telehealth: Payer: Self-pay | Admitting: Cardiology

## 2016-08-19 NOTE — Telephone Encounter (Signed)
LMOVM requesting that pt send manual transmission b/c home monitor has not updated in at least 8 days.   

## 2016-08-20 ENCOUNTER — Telehealth: Payer: Self-pay | Admitting: Cardiology

## 2016-08-20 NOTE — Telephone Encounter (Signed)
LMOM and LMOVM requesting call back.  [facility] phone number for return call.  Remote transmission received.  ICD functioning appropriately.  CorVue and mode switch burden stable.

## 2016-08-20 NOTE — Telephone Encounter (Signed)
Pt called and stated that he has not been feeling well / lack of energy. Transmission received on 08-19-16. Please review and call pt back.

## 2016-08-24 ENCOUNTER — Ambulatory Visit: Payer: Managed Care, Other (non HMO) | Admitting: Internal Medicine

## 2016-08-24 NOTE — Telephone Encounter (Signed)
Spoke with patient and advised him of remote transmission findings.  Patient states that he is feeling better.  He thinks that he has been experiencing post-nasal drip overnight which is causing him "chest congestion" in the morning.  Patient plans to see his PCP and get a referral to an ENT.  Patient is appreciative of call and denies additional ICD-related questions or concerns at this time.  He is aware of his appointment with Dr. [name] on 09/07/16.

## 2016-09-07 ENCOUNTER — Encounter: Payer: Self-pay | Admitting: Internal Medicine

## 2016-09-07 ENCOUNTER — Ambulatory Visit: Payer: Managed Care, Other (non HMO) | Admitting: Cardiology

## 2016-09-07 ENCOUNTER — Ambulatory Visit (INDEPENDENT_AMBULATORY_CARE_PROVIDER_SITE_OTHER): Payer: Managed Care, Other (non HMO) | Admitting: Internal Medicine

## 2016-09-07 VITALS — BP 150/82 | HR 64 | Ht 73.0 in | Wt 210.2 lb

## 2016-09-07 DIAGNOSIS — I255 Ischemic cardiomyopathy: Secondary | ICD-10-CM

## 2016-09-07 DIAGNOSIS — I472 Ventricular tachycardia, unspecified: Secondary | ICD-10-CM

## 2016-09-07 DIAGNOSIS — I4901 Ventricular fibrillation: Secondary | ICD-10-CM | POA: Diagnosis not present

## 2016-09-07 DIAGNOSIS — Z95810 Presence of automatic (implantable) cardiac defibrillator: Secondary | ICD-10-CM

## 2016-09-07 DIAGNOSIS — I480 Paroxysmal atrial fibrillation: Secondary | ICD-10-CM | POA: Diagnosis not present

## 2016-09-07 DIAGNOSIS — I428 Other cardiomyopathies: Secondary | ICD-10-CM

## 2016-09-07 DIAGNOSIS — I5022 Chronic systolic (congestive) heart failure: Secondary | ICD-10-CM

## 2016-09-07 NOTE — Progress Notes (Signed)
Patient Care Team: [name], MD as PCP - General (Internal Medicine) Elsie Stain, MD (Pulmonary Disease)   HPI  Corey Tyler is a 75 y.o. male Seen in followup for ICD implantation 2/13 for primary prevention. This occurred in the context of ischemic/nonischemic cardiac myopathy with underlying bradycardia.    December 2014 he had appropriate ICD discharge for polymorphic ventricular tachycardia.  Subsequent Myoview scan was not gated; it demonstrated a large inferolateral and anterolateral scar without ischemia.  His Coreg was up titrated.  Echo from 2008 demonstrating an EF of 40-45% with inferior/posterior hypokinesis. Cardiac catheterization from 2000 demonstrating normal coronary arteries but he did have a mildly reduced ejection fraction with wall motion abnormality.    Followup cath demonstrated only minimal coronary plaque.  Echocardiogram 1/14 demonstrated progressive left ventricular dysfunction with an EF of 15% and wall motion abnormalities in the inferior wall DATE TEST    2008 echo EF40-45%   1/14    echo   EF 15 %   8/17    echo   EF 25 %    5/15 TSH   8.63` ALT 23   He also has a history of atrial fibrillation. In the past he was treated with Rivaroxaban which was complicated by GI bleeding. He was then treated with apixaban. He is also been treated with amiodarone. Last available TSH was 8.63. He has treated hypothyroidism. Liver panel was normal; this apparently has been addressed by his PCP who has increased his thyroid replacement. Blood work is again expected the next week or so.   The patient denies chest pain, shortness of breath, nocturnal dyspnea, orthopnea does have some asymmetric edema right greater than left. There is a healing excoriation on the right leg   he adjusts his Lasix on an as-needed basis There have been no palpitations, lightheadedness or syncope.   He is feeling much better following his recent cold. He does have some  exercise intolerance    Past Medical History:  Diagnosis Date  . A-fib ([place])    a. Noted on 12/2012 interrogation, placed on Apixaban and ultimately stopped NOACs 2/2 personal decision 8/14.  . Arthritis   . Atrial tachycardia-non sustained    a. Noted on 03/2012 interrogation.  . Cardiac defibrillator -dual  [employer]   . Chronic systolic heart failure ([facility])    EF down to 20 to 25% per echo November 2012  . COPD (chronic obstructive pulmonary disease) ([place])   . Hyperlipidemia   . Hypertension   . ICD (implantable cardiac defibrillator) in place   . NICM (nonischemic cardiomyopathy) ([place])    a. Normal cors 2000. b. Minimal plaque 2013.  . Old myocardial infarction    a. ?Silent MI. b. Large fixed inferolateral defect c/w with prior infarct, no ischemia. c. Cath in 2000/2013 with only minimal CAD.  Marland Kitchen Pulmonary nodule, right    Last scan in 2010 showing stability; felt to be benign.  Marland Kitchen PVC's (premature ventricular contractions)   . Ventricular tachycardia, polymorphic ([place])    Rx via ICD 12/14    Past Surgical History:  Procedure Laterality Date  . APPENDECTOMY    . CARDIAC CATHETERIZATION  2000  . CARDIAC DEFIBRILLATOR PLACEMENT    . CARDIOVERSION  03/30/2013   Procedure: CARDIOVERSION;  Surgeon: Thayer Headings, MD;  Location: [facility];  Service: Cardiovascular;;  . COLON SURGERY    . COLONOSCOPY N/A 09/25/2013   Procedure: COLONOSCOPY;  Surgeon: Jerene Bears, MD;  Location: [facility] ENDOSCOPY;  Service: Endoscopy;  Laterality: N/A;  . ICD  12/2011   [name]  . IMPLANTABLE CARDIOVERTER DEFIBRILLATOR IMPLANT N/A 01/06/2012   Procedure: IMPLANTABLE CARDIOVERTER DEFIBRILLATOR IMPLANT;  Surgeon: Deboraha Sprang, MD;  Location[facility] C[facility] LAB;  Service: Cardiovascular;  Laterality: N/A;  . TEE WITHOUT CARDIOVERSION N/A 03/30/2013   Procedure: TRANSESOPHAGEAL ECHOCARDIOGRAM (TEE);  Surgeon: Thayer Headings, MD;  Location: [facility] ENDOSCOPY;  Service: Cardiovascular;  Laterality: N/A;    Current  Outpatient Prescriptions  Medication Sig Dispense Refill  . albuterol (PROVENTIL) (2.5 MG/3ML) 0.083% nebulizer solution Take 3 mLs (2.5 mg total) by nebulization every 6 (six) hours as needed for wheezing or shortness of breath. 75 mL 4  . Albuterol Sulfate (PROAIR RESPICLICK) 123XX123 (90 BASE) MCG/ACT AEPB Inhale 2 puffs into the lungs every 6 (six) hours as needed. 1 each 2  . amiodarone (PACERONE) 200 MG tablet TAKE ONE TABLET BY MOUTH ONCE DAILY 90 tablet 1  . carvedilol (COREG) 12.5 MG tablet TAKE ONE TABLET BY MOUTH TWICE DAILY WITH MEALS 60 tablet 4  . cholecalciferol (VITAMIN D) 1000 UNITS tablet Take 1,000 Units by mouth daily.    Marland Kitchen ELIQUIS 5 MG TABS tablet TAKE 1 TABLET BY MOUTH TWICE A DAY 60 tablet 5  . finasteride (PROSCAR) 5 MG tablet Take 5 mg by mouth every morning.    . Fluticasone-Salmeterol (ADVAIR DISKUS) 250-50 MCG/DOSE AEPB Inhale 1 puff into the lungs 2 (two) times daily. 60 each 11  . furosemide (LASIX) 40 MG tablet Take 1 tablet (40 mg total) by mouth daily. 90 tablet 1  . levothyroxine (SYNTHROID, LEVOTHROID) 112 MCG tablet Take 112 mcg by mouth daily.     Marland Kitchen loratadine (CLARITIN) 10 MG tablet Take 10 mg by mouth daily.    Marland Kitchen losartan (COZAAR) 50 MG tablet Take 50 mg by mouth 2 (two) times daily.    . Melatonin 5 MG CAPS Take 5 mg by mouth at bedtime as needed (sleep).    . MULTIPLE VITAMINS PO Take 1 tablet by mouth daily.    . nitroGLYCERIN (NITROSTAT) 0.4 MG SL tablet Place 0.4 mg under the tongue every 5 (five) minutes as needed. For chest pain    . OXYGEN Inhale into the lungs. 2 lpm qhs    . pantoprazole (PROTONIX) 40 MG tablet Take 1 tablet (40 mg total) by mouth daily. 31 tablet 11  . promethazine (PHENERGAN) 25 MG tablet Take 1 tablet (25 mg total) by mouth every 6 (six) hours as needed for nausea or vomiting. 30 tablet 3  . psyllium (METAMUCIL) 58.6 % powder Take 1 packet by mouth daily.     . Tamsulosin HCl (FLOMAX) 0.4 MG CAPS Take 0.4 mg by mouth at bedtime.        No current facility-administered medications for this visit.     Allergies  Allergen Reactions  . Xarelto [Rivaroxaban] Other (See Comments)    Bleeding  . Adhesive [Tape] Hives, Itching and Rash  . Atorvastatin Other (See Comments)    cough  . Codeine     nauseated  . Isosorbide Nitrate Other (See Comments)    Reaction unknown to patient  . Latex Itching and Other (See Comments)    Rash, itching, burning  . Levofloxacin Other (See Comments)    Tachycardia  . Lisinopril Cough  . Lorazepam Other (See Comments)    "felt bad"  . Sertraline Other (See Comments)    Reaction unknown to patient  . Tiotropium Bromide Monohydrate  Other (See Comments)    Reaction unknown to patient    Review of Systems negative except from HPI and PMH  Physical Exam BP (!) 150/82   Pulse 64   Ht 6\' 1"  (1.854 m)   Wt 210 lb 3.2 oz (95.3 kg)   SpO2 91%   BMI 27.73 kg/m  Well developed and nourished in no acute distress HENT normal Neck supple with JVP-flat Clear Regular rate and rhythm, no murmurs or gallops Abd-soft with active BS No Clubbing cyanosis right greater than left plus edema Skin-warm and dry healing excoriation right shin with surrounding erythema A & Oriented  Grossly normal sensory and motor function Device pocket well healed; without hematoma or erythema.  There is no tethering   ECG ordered today demonstrates sinus rhythm at 6018/11/45   Assessment and plan  Atrial fibrillation  High risk medication surrveillance labs pending from PCP  Chronotropic incompetence   Implantable defibrillator-St [name]  The patient's device was interrogated and the information was fully reviewed.  The device was reprogrammed to increase heart rate response to activity  Ischemic/nonischemic cardiomyopathy  Congestive heart failure-chronic-systolic  Some of his exercise intolerance may be related to inotropic incompetence. We have increased his heart rate slowed  Without symptoms of  ischemia  No intercurrent Ventricular tachycardia  Infrequent afib--controlled rate.   On Anticoagulation;  No bleeding issues   Amiodarone doses labs will be drawn tomorrow.    Corevue reviewed

## 2016-09-07 NOTE — Patient Instructions (Signed)
Medication Instructions: - Your physician recommends that you continue on your current medications as directed. Please refer to the Current Medication list given to you today.  Labwork: - none ordered  Procedures/Testing: - none ordered   Follow-Up: - Remote monitoring is used to monitor your Pacemaker of ICD from home. This monitoring reduces the number of office visits required to check your device to one time per year. It allows [place] to keep an eye on the functioning of your device to ensure it is working properly. You are scheduled for a device check from home on 12/07/16. You may send your transmission at any time that day. If you have a wireless device, the transmission will be sent automatically. After your physician reviews your transmission, you will receive a postcard with your next transmission date.  - Your physician wants you to follow-up in: 1 year with Dr. [name]. You will receive a reminder letter in the mail two months in advance. If you don't receive a letter, please call our office to schedule the follow-up appointment.  Any Additional Special Instructions Will Be Listed Below (If Applicable).     If you need a refill on your cardiac medications before your next appointment, please call your pharmacy.

## 2016-09-07 NOTE — Progress Notes (Signed)
HPI The patient presents for follow up of acute on chronic systolic heart failure and atrial fibrillation.  He has had GI bleeding last fall on Xarelto but is tolerating Eliquis.  He had  a TSH of 17.57 probably related to amiodarone and has been treated with synthroid.  He had VTach in Dec 2014  requiring cardioversion from his device. He had a followup perfusion study that demonstrated a previous inferior large fixed defect but no ischemia.    He did have an emergency room visit and I reviewed these records recently. This was in August and related to an abnormal taste in his mouth and fatigue.  Since that time he has actually done well. He saw Dr. [name] yesterday and had his pacemaker rate turned up. He's otherwise been feeling okay. He's having less shortness of breath, PND or orthopnea. He is not having any palpitations, presyncope or syncope. He's had no chest pressure, neck or arm discomfort. His weights have been stable.   Allergies  Allergen Reactions  . Xarelto [Rivaroxaban] Other (See Comments)    Bleeding  . Adhesive [Tape] Hives, Itching and Rash  . Atorvastatin Other (See Comments)    cough  . Codeine     nauseated  . Isosorbide Nitrate Other (See Comments)    Reaction unknown to patient  . Latex Itching and Other (See Comments)    Rash, itching, burning  . Levofloxacin Other (See Comments)    Tachycardia  . Lisinopril Cough  . Lorazepam Other (See Comments)    "felt bad"  . Sertraline Other (See Comments)    Reaction unknown to patient  . Tiotropium Bromide Monohydrate Other (See Comments)    Reaction unknown to patient    Current Outpatient Prescriptions  Medication Sig Dispense Refill  . albuterol (PROVENTIL) (2.5 MG/3ML) 0.083% nebulizer solution Take 3 mLs (2.5 mg total) by nebulization every 6 (six) hours as needed for wheezing or shortness of breath. 75 mL 4  . Albuterol Sulfate (PROAIR RESPICLICK) 123XX123 (90 BASE) MCG/ACT AEPB Inhale 2 puffs into the lungs every 6  (six) hours as needed. 1 each 2  . amiodarone (PACERONE) 200 MG tablet TAKE ONE TABLET BY MOUTH ONCE DAILY 90 tablet 1  . carvedilol (COREG) 12.5 MG tablet TAKE ONE TABLET BY MOUTH TWICE DAILY WITH MEALS 60 tablet 4  . cholecalciferol (VITAMIN D) 1000 UNITS tablet Take 1,000 Units by mouth daily.    Marland Kitchen ELIQUIS 5 MG TABS tablet TAKE 1 TABLET BY MOUTH TWICE A DAY 60 tablet 5  . finasteride (PROSCAR) 5 MG tablet Take 5 mg by mouth every morning.    . Fluticasone-Salmeterol (ADVAIR DISKUS) 250-50 MCG/DOSE AEPB Inhale 1 puff into the lungs 2 (two) times daily. 60 each 11  . furosemide (LASIX) 40 MG tablet Take 1 tablet (40 mg total) by mouth daily. 90 tablet 1  . levothyroxine (SYNTHROID, LEVOTHROID) 112 MCG tablet Take 112 mcg by mouth daily.     Marland Kitchen loratadine (CLARITIN) 10 MG tablet Take 10 mg by mouth daily.    Marland Kitchen losartan (COZAAR) 50 MG tablet Take 50 mg by mouth 2 (two) times daily.    . Melatonin 5 MG CAPS Take 5 mg by mouth at bedtime as needed (sleep).    . MULTIPLE VITAMINS PO Take 1 tablet by mouth daily.    . nitroGLYCERIN (NITROSTAT) 0.4 MG SL tablet Place 0.4 mg under the tongue every 5 (five) minutes as needed. For chest pain    . OXYGEN Inhale into  the lungs. 2 lpm qhs    . pantoprazole (PROTONIX) 40 MG tablet Take 1 tablet (40 mg total) by mouth daily. 31 tablet 11  . promethazine (PHENERGAN) 25 MG tablet Take 1 tablet (25 mg total) by mouth every 6 (six) hours as needed for nausea or vomiting. 30 tablet 3  . psyllium (METAMUCIL) 58.6 % powder Take 1 packet by mouth daily.     . Tamsulosin HCl (FLOMAX) 0.4 MG CAPS Take 0.4 mg by mouth at bedtime.      No current facility-administered medications for this visit.     Past Medical History:  Diagnosis Date  . A-fib ([place])    a. Noted on 12/2012 interrogation, placed on Apixaban and ultimately stopped NOACs 2/2 personal decision 8/14.  . Arthritis   . Atrial tachycardia-non sustained    a. Noted on 03/2012 interrogation.  . Cardiac  defibrillator -dual  [employer]   . Chronic systolic heart failure (HCC)    EF down to 20 to 25% per echo November 2012  . COPD (chronic obstructive pulmonary disease) ([place])   . Hyperlipidemia   . Hypertension   . ICD (implantable cardiac defibrillator) in place   . NICM (nonischemic cardiomyopathy) ([place])    a. Normal cors 2000. b. Minimal plaque 2013.  . Old myocardial infarction    a. ?Silent MI. b. Large fixed inferolateral defect c/w with prior infarct, no ischemia. c. Cath in 2000/2013 with only minimal CAD.  Marland Kitchen Pulmonary nodule, right    Last scan in 2010 showing stability; felt to be benign.  Marland Kitchen PVC's (premature ventricular contractions)   . Ventricular tachycardia, polymorphic ([place])    Rx via ICD 12/14    Past Surgical History:  Procedure Laterality Date  . APPENDECTOMY    . CARDIAC CATHETERIZATION  2000  . CARDIAC DEFIBRILLATOR PLACEMENT    . CARDIOVERSION  03/30/2013   Procedure: CARDIOVERSION;  Surgeon: Thayer Headings, MD;  Location: [facility];  Service: Cardiovascular;;  . COLON SURGERY    . COLONOSCOPY N/A 09/25/2013   Procedure: COLONOSCOPY;  Surgeon: Jerene Bears, MD;  Location: [facility] ENDOSCOPY;  Service: Endoscopy;  Laterality: N/A;  . ICD  12/2011   [name]  . IMPLANTABLE CARDIOVERTER DEFIBRILLATOR IMPLANT N/A 01/06/2012   Procedure: IMPLANTABLE CARDIOVERTER DEFIBRILLATOR IMPLANT;  Surgeon: Deboraha Sprang, MD;  Location: [facility] CATH LAB;  Service: Cardiovascular;  Laterality: N/A;  . TEE WITHOUT CARDIOVERSION N/A 03/30/2013   Procedure: TRANSESOPHAGEAL ECHOCARDIOGRAM (TEE);  Surgeon: Thayer Headings, MD;  Location: [facility] ENDOSCOPY;  Service: Cardiovascular;  Laterality: N/A;    ROS:  As stated in the HPI and negative for all other systems.   PHYSICAL EXAM BP 124/80 (BP Location: Left Arm, Patient Position: Sitting, Cuff Size: Normal)   Pulse 64   Ht 6\' 1"  (1.854 m)   Wt 207 lb 6 oz (94.1 kg)   BMI 27.36 kg/m  GENERAL:  Well appearing NECK:  No jugular venous distention  but hepatojugular reflux noted, waveform within normal limits, carotid upstroke brisk and symmetric, no bruits, no thyromegaly LUNGS:  Clear to auscultation bilaterally BACK:  No CVA tenderness CHEST:  Well healed ICD pocket.   HEART:  PMI not displaced or sustained,S1 and S2 within normal limits, no S3, no S4, no clicks, no rubs, 2/6 holosystolic apical systolic murmur, no diastolic regular ABD:  Flat, positive bowel sounds normal in frequency in pitch, no bruits, no rebound, no guarding, no midline pulsatile mass, no hepatomegaly, no splenomegaly EXT:  2 plus  pulses throughout, mild ankle edema, no cyanosis no clubbing    ASSESSMENT AND PLAN   Ischemic cardiomyopathy - The patient hasn't tolerated beta blocker titration or other med titration in the past. He is actually euvolemic. He will continue on the meds as listed.  Atrial fibrillation - He had liver enzymes and TSH in May and I will schedule for him to have these in November. He tolerates the amiodarone and he's had no problems with his blood thinner..  [name] has a CHA2DS2 - VASc score of 5 with a risk of stroke of 6.7%.  We called his primary care and he is up to date with his labs.  He needs a liver profile and TSH again in Nov.  We will order this.   HYPERTENSION -  This is being managed in the context of treating his CHF.    CAD - The patient has no new sypmtoms.   He had only minimal plaque at the previous catheterization. No change in therapy or further evaluation is indicated.  OTHER - He was given a handicap placard and a flu shot today.  ER records reviewed.

## 2016-09-08 ENCOUNTER — Ambulatory Visit (INDEPENDENT_AMBULATORY_CARE_PROVIDER_SITE_OTHER): Payer: Managed Care, Other (non HMO) | Admitting: Cardiology

## 2016-09-08 ENCOUNTER — Encounter: Payer: Self-pay | Admitting: Cardiology

## 2016-09-08 VITALS — BP 124/80 | HR 64 | Ht 73.0 in | Wt 207.4 lb

## 2016-09-08 DIAGNOSIS — I5022 Chronic systolic (congestive) heart failure: Secondary | ICD-10-CM

## 2016-09-08 DIAGNOSIS — Z23 Encounter for immunization: Secondary | ICD-10-CM | POA: Diagnosis not present

## 2016-09-08 DIAGNOSIS — Z79899 Other long term (current) drug therapy: Secondary | ICD-10-CM

## 2016-09-08 DIAGNOSIS — I255 Ischemic cardiomyopathy: Secondary | ICD-10-CM

## 2016-09-08 DIAGNOSIS — R5383 Other fatigue: Secondary | ICD-10-CM

## 2016-09-08 DIAGNOSIS — I480 Paroxysmal atrial fibrillation: Secondary | ICD-10-CM | POA: Diagnosis not present

## 2016-09-08 NOTE — Patient Instructions (Signed)
Medication Instructions:  Continue current medications  Labwork: TSH and Liver funtion  Testing/Procedures: None Ordered  Follow-Up: Your physician recommends that you schedule a follow-up appointment in: March 2018   Any Other Special Instructions Will Be Listed Below (If Applicable).   If you need a refill on your cardiac medications before your next appointment, please call your pharmacy.

## 2016-09-09 ENCOUNTER — Encounter: Payer: Self-pay | Admitting: Cardiology

## 2016-09-18 ENCOUNTER — Encounter: Payer: Self-pay | Admitting: Nurse Practitioner

## 2016-09-23 ENCOUNTER — Other Ambulatory Visit: Payer: Self-pay | Admitting: Cardiology

## 2016-09-24 DIAGNOSIS — R972 Elevated prostate specific antigen [PSA]: Secondary | ICD-10-CM | POA: Diagnosis not present

## 2016-09-24 DIAGNOSIS — I10 Essential (primary) hypertension: Secondary | ICD-10-CM | POA: Diagnosis not present

## 2016-09-30 ENCOUNTER — Ambulatory Visit: Payer: Managed Care, Other (non HMO) | Admitting: Internal Medicine

## 2016-09-30 DIAGNOSIS — E039 Hypothyroidism, unspecified: Secondary | ICD-10-CM | POA: Diagnosis not present

## 2016-09-30 DIAGNOSIS — I10 Essential (primary) hypertension: Secondary | ICD-10-CM | POA: Diagnosis not present

## 2016-09-30 DIAGNOSIS — R972 Elevated prostate specific antigen [PSA]: Secondary | ICD-10-CM | POA: Diagnosis not present

## 2016-09-30 DIAGNOSIS — I2510 Atherosclerotic heart disease of native coronary artery without angina pectoris: Secondary | ICD-10-CM | POA: Diagnosis not present

## 2016-10-06 ENCOUNTER — Other Ambulatory Visit: Payer: Self-pay | Admitting: Cardiovascular Disease

## 2016-11-02 ENCOUNTER — Encounter (HOSPITAL_COMMUNITY): Payer: Self-pay | Admitting: Emergency Medicine

## 2016-11-02 ENCOUNTER — Inpatient Hospital Stay (HOSPITAL_COMMUNITY)
Admission: EM | Admit: 2016-11-02 | Discharge: 2016-11-04 | DRG: 309 | Disposition: A | Discharge status: Home / Self Care | Payer: Managed Care, Other (non HMO) | Attending: Internal Medicine | Admitting: Internal Medicine

## 2016-11-02 ENCOUNTER — Emergency Department (HOSPITAL_COMMUNITY): Payer: Managed Care, Other (non HMO)

## 2016-11-02 DIAGNOSIS — I110 Hypertensive heart disease with heart failure: Secondary | ICD-10-CM | POA: Diagnosis not present

## 2016-11-02 DIAGNOSIS — Z79899 Other long term (current) drug therapy: Secondary | ICD-10-CM

## 2016-11-02 DIAGNOSIS — I480 Paroxysmal atrial fibrillation: Secondary | ICD-10-CM

## 2016-11-02 DIAGNOSIS — Z825 Family history of asthma and other chronic lower respiratory diseases: Secondary | ICD-10-CM

## 2016-11-02 DIAGNOSIS — Z7901 Long term (current) use of anticoagulants: Secondary | ICD-10-CM

## 2016-11-02 DIAGNOSIS — R0602 Shortness of breath: Secondary | ICD-10-CM | POA: Diagnosis not present

## 2016-11-02 DIAGNOSIS — I5022 Chronic systolic (congestive) heart failure: Secondary | ICD-10-CM | POA: Diagnosis not present

## 2016-11-02 DIAGNOSIS — I472 Ventricular tachycardia, unspecified: Secondary | ICD-10-CM

## 2016-11-02 DIAGNOSIS — I252 Old myocardial infarction: Secondary | ICD-10-CM

## 2016-11-02 DIAGNOSIS — Z7951 Long term (current) use of inhaled steroids: Secondary | ICD-10-CM

## 2016-11-02 DIAGNOSIS — Z886 Allergy status to analgesic agent status: Secondary | ICD-10-CM

## 2016-11-02 DIAGNOSIS — Z8249 Family history of ischemic heart disease and other diseases of the circulatory system: Secondary | ICD-10-CM

## 2016-11-02 DIAGNOSIS — I429 Cardiomyopathy, unspecified: Secondary | ICD-10-CM | POA: Diagnosis present

## 2016-11-02 DIAGNOSIS — Z95810 Presence of automatic (implantable) cardiac defibrillator: Secondary | ICD-10-CM

## 2016-11-02 DIAGNOSIS — I428 Other cardiomyopathies: Secondary | ICD-10-CM

## 2016-11-02 DIAGNOSIS — Z9889 Other specified postprocedural states: Secondary | ICD-10-CM

## 2016-11-02 DIAGNOSIS — Z9049 Acquired absence of other specified parts of digestive tract: Secondary | ICD-10-CM

## 2016-11-02 DIAGNOSIS — R072 Precordial pain: Secondary | ICD-10-CM | POA: Diagnosis not present

## 2016-11-02 DIAGNOSIS — I2510 Atherosclerotic heart disease of native coronary artery without angina pectoris: Secondary | ICD-10-CM | POA: Diagnosis present

## 2016-11-02 DIAGNOSIS — Z91040 Latex allergy status: Secondary | ICD-10-CM

## 2016-11-02 DIAGNOSIS — Z888 Allergy status to other drugs, medicaments and biological substances status: Secondary | ICD-10-CM

## 2016-11-02 DIAGNOSIS — Z87891 Personal history of nicotine dependence: Secondary | ICD-10-CM

## 2016-11-02 DIAGNOSIS — J449 Chronic obstructive pulmonary disease, unspecified: Secondary | ICD-10-CM | POA: Diagnosis present

## 2016-11-02 LAB — CBC WITH DIFFERENTIAL/PLATELET
Basophils Absolute: 0 10*3/uL (ref 0.0–0.1)
Basophils Relative: 0 %
EOS ABS: 0.1 10*3/uL (ref 0.0–0.7)
EOS PCT: 0 %
HCT: 45.1 % (ref 39.0–52.0)
HEMOGLOBIN: 15.2 g/dL (ref 13.0–17.0)
LYMPHS ABS: 2.1 10*3/uL (ref 0.7–4.0)
Lymphocytes Relative: 18 %
MCH: 31.4 pg (ref 26.0–34.0)
MCHC: 33.7 g/dL (ref 30.0–36.0)
MCV: 93.2 fL (ref 78.0–100.0)
MONOS PCT: 8 %
Monocytes Absolute: 0.9 10*3/uL (ref 0.1–1.0)
NEUTROS PCT: 74 %
Neutro Abs: 8.5 10*3/uL — ABNORMAL HIGH (ref 1.7–7.7)
Platelets: 181 10*3/uL (ref 150–400)
RBC: 4.84 MIL/uL (ref 4.22–5.81)
RDW: 14.7 % (ref 11.5–15.5)
WBC: 11.6 10*3/uL — ABNORMAL HIGH (ref 4.0–10.5)

## 2016-11-02 LAB — BASIC METABOLIC PANEL
Anion gap: 12 (ref 5–15)
BUN: 22 mg/dL — AB (ref 6–20)
CHLORIDE: 103 mmol/L (ref 101–111)
CO2: 24 mmol/L (ref 22–32)
CREATININE: 1.61 mg/dL — AB (ref 0.61–1.24)
Calcium: 9.2 mg/dL (ref 8.9–10.3)
GFR calc Af Amer: 46 mL/min — ABNORMAL LOW (ref >60)
GFR calc non Af Amer: 40 mL/min — ABNORMAL LOW (ref >60)
GLUCOSE: 131 mg/dL — AB (ref 65–99)
Potassium: 3.8 mmol/L (ref 3.5–5.1)
SODIUM: 139 mmol/L (ref 135–145)

## 2016-11-02 LAB — I-STAT TROPONIN, ED: Troponin i, poc: 0.02 ng/mL (ref 0.00–0.08)

## 2016-11-02 LAB — MAGNESIUM: MAGNESIUM: 2.3 mg/dL (ref 1.7–2.4)

## 2016-11-02 MED ORDER — FUROSEMIDE 40 MG PO TABS
40.0000 mg | ORAL_TABLET | Freq: Every day | ORAL | Status: DC
  Administered 2016-11-03 – 2016-11-04 (×2): 40 mg via ORAL
  Filled 2016-11-02 (×2): qty 1

## 2016-11-02 MED ORDER — SODIUM CHLORIDE 0.9 % IV SOLN
1000.0000 mL | Freq: Once | INTRAVENOUS | Status: AC
  Administered 2016-11-02: 1000 mL via INTRAVENOUS

## 2016-11-02 MED ORDER — APIXABAN 5 MG PO TABS
5.0000 mg | ORAL_TABLET | Freq: Two times a day (BID) | ORAL | Status: DC
  Administered 2016-11-02 – 2016-11-04 (×4): 5 mg via ORAL
  Filled 2016-11-02 (×4): qty 1

## 2016-11-02 MED ORDER — AMIODARONE HCL IN DEXTROSE 360-4.14 MG/200ML-% IV SOLN
60.0000 mg/h | INTRAVENOUS | Status: AC
  Administered 2016-11-02 (×2): 60 mg/h via INTRAVENOUS
  Filled 2016-11-02: qty 200

## 2016-11-02 MED ORDER — ZOLPIDEM TARTRATE 5 MG PO TABS
5.0000 mg | ORAL_TABLET | Freq: Every evening | ORAL | Status: DC | PRN
  Administered 2016-11-02 – 2016-11-04 (×2): 5 mg via ORAL
  Filled 2016-11-02 (×2): qty 1

## 2016-11-02 MED ORDER — NITROGLYCERIN 0.4 MG SL SUBL
0.4000 mg | SUBLINGUAL_TABLET | SUBLINGUAL | Status: DC | PRN
Start: 2016-11-02 — End: 2016-11-04

## 2016-11-02 MED ORDER — PANTOPRAZOLE SODIUM 40 MG PO TBEC
40.0000 mg | DELAYED_RELEASE_TABLET | Freq: Every day | ORAL | Status: DC
  Administered 2016-11-03 – 2016-11-04 (×2): 40 mg via ORAL
  Filled 2016-11-02 (×2): qty 1

## 2016-11-02 MED ORDER — CARVEDILOL 12.5 MG PO TABS
12.5000 mg | ORAL_TABLET | Freq: Two times a day (BID) | ORAL | Status: DC
  Administered 2016-11-02 – 2016-11-04 (×4): 12.5 mg via ORAL
  Filled 2016-11-02 (×4): qty 1

## 2016-11-02 MED ORDER — ORAL CARE MOUTH RINSE
15.0000 mL | Freq: Two times a day (BID) | OROMUCOSAL | Status: DC
  Administered 2016-11-03 – 2016-11-04 (×3): 15 mL via OROMUCOSAL

## 2016-11-02 MED ORDER — ALBUTEROL SULFATE 108 (90 BASE) MCG/ACT IN AEPB: 2.0000 | INHALATION_SPRAY | Freq: Four times a day (QID) | RESPIRATORY_TRACT | Status: DC | PRN

## 2016-11-02 MED ORDER — ACETAMINOPHEN 325 MG PO TABS: 650.0000 mg | ORAL_TABLET | ORAL | Status: DC | PRN

## 2016-11-02 MED ORDER — ADULT MULTIVITAMIN W/MINERALS CH
1.0000 | ORAL_TABLET | Freq: Every day | ORAL | Status: DC
  Administered 2016-11-03 – 2016-11-04 (×2): 1 via ORAL
  Filled 2016-11-02 (×2): qty 1

## 2016-11-02 MED ORDER — LEVOTHYROXINE SODIUM 112 MCG PO TABS
112.0000 ug | ORAL_TABLET | Freq: Every day | ORAL | Status: DC
  Administered 2016-11-03 – 2016-11-04 (×2): 112 ug via ORAL
  Filled 2016-11-02 (×2): qty 1

## 2016-11-02 MED ORDER — VITAMIN D 1000 UNITS PO TABS
1000.0000 [IU] | ORAL_TABLET | Freq: Every day | ORAL | Status: DC
  Administered 2016-11-03 – 2016-11-04 (×2): 1000 [IU] via ORAL
  Filled 2016-11-02 (×2): qty 1

## 2016-11-02 MED ORDER — ALBUTEROL SULFATE (2.5 MG/3ML) 0.083% IN NEBU: 2.5000 mg | INHALATION_SOLUTION | Freq: Four times a day (QID) | RESPIRATORY_TRACT | Status: DC | PRN

## 2016-11-02 MED ORDER — CARVEDILOL 12.5 MG PO TABS: 12.5000 mg | ORAL_TABLET | Freq: Two times a day (BID) | ORAL | Status: DC

## 2016-11-02 MED ORDER — PSYLLIUM 95 % PO PACK
1.0000 | PACK | Freq: Every day | ORAL | Status: DC
  Administered 2016-11-03 – 2016-11-04 (×2): 1 via ORAL
  Filled 2016-11-02 (×2): qty 1

## 2016-11-02 MED ORDER — LOSARTAN POTASSIUM 50 MG PO TABS: 50.0000 mg | ORAL_TABLET | Freq: Two times a day (BID) | ORAL | Status: DC

## 2016-11-02 MED ORDER — AMIODARONE HCL IN DEXTROSE 360-4.14 MG/200ML-% IV SOLN
INTRAVENOUS | Status: AC
  Administered 2016-11-02: 60 mg/h via INTRAVENOUS
  Filled 2016-11-02: qty 200

## 2016-11-02 MED ORDER — ONDANSETRON HCL 4 MG/2ML IJ SOLN: 4.0000 mg | Freq: Four times a day (QID) | INTRAMUSCULAR | Status: DC | PRN

## 2016-11-02 MED ORDER — LOSARTAN POTASSIUM 50 MG PO TABS
50.0000 mg | ORAL_TABLET | Freq: Two times a day (BID) | ORAL | Status: DC
  Administered 2016-11-02 – 2016-11-04 (×4): 50 mg via ORAL
  Filled 2016-11-02 (×4): qty 1

## 2016-11-02 MED ORDER — FINASTERIDE 5 MG PO TABS
5.0000 mg | ORAL_TABLET | Freq: Every morning | ORAL | Status: DC
  Administered 2016-11-03 – 2016-11-04 (×2): 5 mg via ORAL
  Filled 2016-11-02 (×2): qty 1

## 2016-11-02 MED ORDER — MOMETASONE FURO-FORMOTEROL FUM 200-5 MCG/ACT IN AERO
2.0000 | INHALATION_SPRAY | Freq: Two times a day (BID) | RESPIRATORY_TRACT | Status: DC
  Administered 2016-11-03 – 2016-11-04 (×3): 2 via RESPIRATORY_TRACT
  Filled 2016-11-02: qty 8.8

## 2016-11-02 MED ORDER — PROMETHAZINE HCL 25 MG PO TABS: 25.0000 mg | ORAL_TABLET | Freq: Four times a day (QID) | ORAL | Status: DC | PRN

## 2016-11-02 MED ORDER — AMIODARONE HCL IN DEXTROSE 360-4.14 MG/200ML-% IV SOLN: 30.0000 mg/h | INTRAVENOUS | Status: DC

## 2016-11-02 MED ORDER — ASPIRIN EC 81 MG PO TBEC
81.0000 mg | DELAYED_RELEASE_TABLET | Freq: Every day | ORAL | Status: DC
  Administered 2016-11-03 – 2016-11-04 (×2): 81 mg via ORAL
  Filled 2016-11-02 (×2): qty 1

## 2016-11-02 MED ORDER — AMIODARONE LOAD VIA INFUSION
150.0000 mg | Freq: Once | INTRAVENOUS | Status: AC
Start: 2016-11-02 — End: 2016-11-02
  Administered 2016-11-02: 150 mg via INTRAVENOUS

## 2016-11-02 MED ORDER — TAMSULOSIN HCL 0.4 MG PO CAPS
0.4000 mg | ORAL_CAPSULE | Freq: Every day | ORAL | Status: DC
  Administered 2016-11-02 – 2016-11-03 (×2): 0.4 mg via ORAL
  Filled 2016-11-02 (×2): qty 1

## 2016-11-02 MED ORDER — LORATADINE 10 MG PO TABS
10.0000 mg | ORAL_TABLET | Freq: Every day | ORAL | Status: DC
  Administered 2016-11-03 – 2016-11-04 (×2): 10 mg via ORAL
  Filled 2016-11-02 (×2): qty 1

## 2016-11-02 MED ORDER — AMIODARONE LOAD VIA INFUSION
150.0000 mg | Freq: Once | INTRAVENOUS | Status: AC
  Administered 2016-11-02: 150 mg via INTRAVENOUS

## 2016-11-02 MED ORDER — MELATONIN 3 MG PO TABS
6.0000 mg | ORAL_TABLET | Freq: Every evening | ORAL | Status: DC | PRN
  Filled 2016-11-02 (×2): qty 2

## 2016-11-02 NOTE — H&P (Signed)
Primary cardiologist: Dr. Minus Breeding EP cardiologist: Dr. Virl Axe  Reason for admission: VT, chest pain  Clinical Summary Corey Tyler is a 75 y.o.male with past medical history outlined below, presenting to the ER this evening with weakness, chest discomfort, and tachycardia. He states that about a week ago he began experiencing chest congestion and cough, was treated with steroids and ultimately placed on antibiotics. Today he went to work at [employer], while he was there he suddenly became very weak and had to go home. He states that he felt lightheaded as if he might pass out, also experiencing shoulder discomfort and ultimately chest pain for which he took nitroglycerin. Called family and was encouraged to go to the ER.  On evaluation by ER staff patient noted to have systolic blood pressure in the 90s, heart rate in the 150s. ECG shows wide complex tachycardia 151 bpm concerning for VT. he denies having any syncope or device shocks. He has a [employer] [name] ICD in place, device parameters indicate a slow VT detection rate of 200 bpm.  I was consulted about the patient, reviewed the ECG and asked that the patient be given amiodarone 300 mg bolus with initiation of amiodarone infusion. Also asked for device interrogation with plan to pursue cardioversion if needed. Fortunately, he returned to sinus rhythm with amiodarone.  He reports compliance with his medications including amiodarone 200 mg daily and Coreg 12.5 mg twice daily. He also has a history of PAF and is on Eliquis.   Allergies  Allergen Reactions  . Xarelto [Rivaroxaban] Other (See Comments)    Bleeding  . Adhesive [Tape] Hives, Itching and Rash  . Atorvastatin Other (See Comments)    cough  . Codeine     nauseated  . Isosorbide Nitrate Other (See Comments)    Reaction unknown to patient  . Latex Itching and Other (See Comments)    Rash, itching, burning  . Levofloxacin Other (See Comments)    Tachycardia  .  Lisinopril Cough  . Lorazepam Other (See Comments)    "felt bad"  . Sertraline Other (See Comments)    Reaction unknown to patient  . Tiotropium Bromide Monohydrate Other (See Comments)    Reaction unknown to patient    Home Medications No current facility-administered medications on file prior to encounter.    Current Outpatient Prescriptions on File Prior to Encounter  Medication Sig Dispense Refill  . albuterol (PROVENTIL) (2.5 MG/3ML) 0.083% nebulizer solution Take 3 mLs (2.5 mg total) by nebulization every 6 (six) hours as needed for wheezing or shortness of breath. 75 mL 4  . Albuterol Sulfate (PROAIR RESPICLICK) 123XX123 (90 BASE) MCG/ACT AEPB Inhale 2 puffs into the lungs every 6 (six) hours as needed. 1 each 2  . amiodarone (PACERONE) 200 MG tablet TAKE ONE TABLET BY MOUTH ONCE DAILY 90 tablet 1  . carvedilol (COREG) 12.5 MG tablet TAKE ONE TABLET BY MOUTH TWICE DAILY WITH MEALS 60 tablet 4  . cholecalciferol (VITAMIN D) 1000 UNITS tablet Take 1,000 Units by mouth daily.    Marland Kitchen ELIQUIS 5 MG TABS tablet TAKE 1 TABLET BY MOUTH TWICE A DAY 60 tablet 5  . finasteride (PROSCAR) 5 MG tablet Take 5 mg by mouth every morning.    . Fluticasone-Salmeterol (ADVAIR DISKUS) 250-50 MCG/DOSE AEPB Inhale 1 puff into the lungs 2 (two) times daily. 60 each 11  . furosemide (LASIX) 40 MG tablet TAKE ONE TABLET BY MOUTH ONCE DAILY 90 tablet 3  . levothyroxine (SYNTHROID,  LEVOTHROID) 112 MCG tablet Take 112 mcg by mouth daily.     Marland Kitchen loratadine (CLARITIN) 10 MG tablet Take 10 mg by mouth daily.    Marland Kitchen losartan (COZAAR) 50 MG tablet Take 50 mg by mouth 2 (two) times daily.    . Melatonin 5 MG CAPS Take 5 mg by mouth at bedtime as needed (sleep).    . MULTIPLE VITAMINS PO Take 1 tablet by mouth daily.    . nitroGLYCERIN (NITROSTAT) 0.4 MG SL tablet Place 0.4 mg under the tongue every 5 (five) minutes as needed. For chest pain    . OXYGEN Inhale into the lungs. 2 lpm qhs    . pantoprazole (PROTONIX) 40 MG  tablet Take 1 tablet (40 mg total) by mouth daily. 31 tablet 11  . promethazine (PHENERGAN) 25 MG tablet Take 1 tablet (25 mg total) by mouth every 6 (six) hours as needed for nausea or vomiting. 30 tablet 3  . psyllium (METAMUCIL) 58.6 % powder Take 1 packet by mouth daily.     . Tamsulosin HCl (FLOMAX) 0.4 MG CAPS Take 0.4 mg by mouth at bedtime.        Past Medical History:  Diagnosis Date  . A-fib ([place])    a. Noted on 12/2012 interrogation, placed on Apixaban and ultimately stopped NOACs 2/2 personal decision 8/14.  . Arthritis   . Atrial tachycardia-non sustained    a. Noted on 03/2012 interrogation.  . Cardiac defibrillator -dual  [employer]   . Chronic systolic heart failure (HCC)    EF down to 20 to 25% per echo November 2012  . COPD (chronic obstructive pulmonary disease) ([place])   . Hyperlipidemia   . Hypertension   . ICD (implantable cardiac defibrillator) in place   . NICM (nonischemic cardiomyopathy) ([place])    a. Normal cors 2000. b. Minimal plaque 2013.  . Old myocardial infarction    a. ?Silent MI. b. Large fixed inferolateral defect c/w with prior infarct, no ischemia. c. Cath in 2000/2013 with only minimal CAD.  Marland Kitchen Pulmonary nodule, right    Last scan in 2010 showing stability; felt to be benign.  Marland Kitchen PVC's (premature ventricular contractions)   . Ventricular tachycardia, polymorphic ([place])    Rx via ICD 12/14    Past Surgical History:  Procedure Laterality Date  . APPENDECTOMY    . CARDIAC CATHETERIZATION  2000  . CARDIAC DEFIBRILLATOR PLACEMENT    . CARDIOVERSION  03/30/2013   Procedure: CARDIOVERSION;  Surgeon: Thayer Headings, MD;  Location: [place];  Service: Cardiovascular;;  . COLON SURGERY    . COLONOSCOPY N/A 09/25/2013   Procedure: COLONOSCOPY;  Surgeon: Jerene Bears, MD;  Location: [facility] ENDOSCOPY;  Service: Endoscopy;  Laterality: N/A;  . ICD  12/2011   [name]  . IMPLANTABLE CARDIOVERTER DEFIBRILLATOR IMPLANT N/A 01/06/2012   Procedure: IMPLANTABLE  CARDIOVERTER DEFIBRILLATOR IMPLANT;  Surgeon: Deboraha Sprang, MD;  Location: [facility] CATH LAB;  Service: Cardiovascular;  Laterality: N/A;  . TEE WITHOUT CARDIOVERSION N/A 03/30/2013   Procedure: TRANSESOPHAGEAL ECHOCARDIOGRAM (TEE);  Surgeon: Thayer Headings, MD;  Location: [facility] ENDOSCOPY;  Service: Cardiovascular;  Laterality: N/A;    Family History  Problem Relation Age of Onset  . Emphysema Mother   . Heart disease Mother     AVR  . Heart failure Father     Died agwe 26    Social History Mr. Bethancourt reports that he quit smoking about 32 years ago. His smoking use included Cigarettes. He has a 100.00  pack-year smoking history. He has never used smokeless tobacco. Mr. Sogge reports that he does not drink alcohol.  Review of Systems Complete review of systems negative except as otherwise outlined in the clinical summary and also the following. Reports no exertional chest pain.   Physical Examination Temp:  [97.7 F (36.5 C)] 97.7 F (36.5 C) (12/11 1820) Pulse Rate:  [52-150] 58 (12/11 1915) Resp:  [11-23] 18 (12/11 1915) BP: (87-161)/(59-148) 120/75 (12/11 1915) SpO2:  [87 %-92 %] 90 % (12/11 1915)  Telemetry: Sinus rhythm with rare PVC.  Gen.: Elderly male in no distress. HEENT: Conjunctiva and lids normal, oropharynx clear. Neck: Supple, no elevated JVP or carotid bruits, no thyromegaly. Lungs: Clear to auscultation, nonlabored breathing at rest. Cardiac: Indistinct PMI, RRR, no S3 or significant systolic murmur, no pericardial rub. Abdomen: Soft, nontender, bowel sounds present, no guarding or rebound. Extremities: Mild lower leg edema right greater than left, distal pulses 2+. Skin: Warm and dry. Musculoskeletal: No kyphosis. Neuropsychiatric: Alert and oriented x3, affect grossly appropriate.  Lab Results  Basic Metabolic Panel:  Recent Labs Lab 11/02/16 1835  NA 139  K 3.8  CL 103  CO2 24  GLUCOSE 131*  BUN 22*  CREATININE 1.61*  CALCIUM 9.2  MG 2.3     CBC:  Recent Labs Lab 11/02/16 1835  WBC 11.6*  NEUTROABS 8.5*  HGB 15.2  HCT 45.1  MCV 93.2  PLT 181    Cardiac Enzymes: 0.02  ECG Follow-up tracing after VT shows sinus rhythm without significant ST segment changes.  Imaging  Echocardiogram 07/22/2016: Study Conclusions  - Left ventricle: The cavity size was mildly dilated. There was   mild focal basal hypertrophy of the septum. Systolic function was   severely reduced. The estimated ejection fraction was in the   range of 25% to 30%. Diffuse hypokinesis. Hypokinesis is worse in   the inferolateral and anteriolateral myocardium. Doppler   parameters are consistent with abnormal left ventricular   relaxation (grade 1 diastolic dysfunction). Doppler parameters   are consistent with indeterminate ventricular filling pressure. - Aortic valve: Transvalvular velocity was within the normal range.   There was no stenosis. There was mild regurgitation. - Mitral valve: Transvalvular velocity was within the normal range.   There was no evidence for stenosis. There was mild regurgitation. - Left atrium: The atrium was moderately dilated. - Right ventricle: The cavity size was mildly dilated. Wall   thickness was normal. Systolic function was mildly reduced. - Tricuspid valve: There was mild regurgitation. - Pulmonic valve: Transvalvular velocity was within the normal   range. There was no evidence for stenosis. There was mild   regurgitation. - Pulmonary arteries: Systolic pressure was mildly increased. PA   peak pressure: 41 mm Hg (S). - Pericardium, extracardiac: A small pericardial effusion was   identified.  Impression  1. Presentation with symptomatic wide complex tachycardia consistent with VT in patient with an nonischemic cardiomyopathy, LVEF 25-30%, St. [name] ICD in place, and on oral amiodarone as an outpatient. Heart rate was in the 150s, below the slow VT detection/treatment zone. He has a history of PMVT but  no episodes for the last few years. He denies any recent device shocks or syncope.  2. Experienced shoulder and chest discomfort with the above symptoms, presently resolved. Question new ischemic precipitant for VT or secondary angina in the setting of rapid heart rate. Initial POC troponin I is negative. No ischemic changes by ECG in sinus rhythm.   3. History of  paroxysmal atrial fibrillation. He is currently on Eliquis.   4. Chronic systolic heart failure, reports recent URI symptoms but chronic stable lower leg edema that is mild and no recent changes in his cardiac regimen.  Recommendations  Discussed with patient and son. We will admit him to ICU/stepdown for further evaluation. Continue amiodarone infusion for now, otherwise baseline outpatient cardiac regimen including Eliquis. Cycle cardiac markers to further assess for ACS although his cardiomyopathy has been nonischemic based on previous workup. Will have EP service evaluate regarding potential medication adjustments and/or device parameter modifications. Follow-up device interrogation is pending at this time.   Satira Sark, M.D., F.A.C.C.

## 2016-11-02 NOTE — ED Provider Notes (Signed)
[facility] DEPT Provider Note   CSN: [id number] Arrival date & time: 11/02/16  1804     History   Chief Complaint Chief Complaint  Patient presents with  . Palpitations  . Nasal Congestion  . Chest Pain  . Fatigue    HPI Corey Tyler is a 75 y.o. male. Patient presents for evaluation of chest pain, palpitations, dizziness lightheadedness.  Patient has a history of previous? Silent MI, with ischemic myopathy. EF down as low as 15, most recent 25%. Follows with Dr. Virl Axe. Has implanted pacer defibrillator. Originally placed for primary prevention. Had one delivered shock for DVT December 2014.  He's had "cough and congestion" for the last week. On his own by his primary care physician. Uses nebulizers at home. Nonproductive cough. Today started feeling poorly this morning weak. By 3:00 he was feeling worse, and was aware of having palpitations and chest pain. About 5:00, he took a nitroglycerin for some discomfort in his shoulders. This resolved after nitroglycerin, however, he was near syncopal. Brought here by his family. Arrives tachycardic. Rate 150. Wide-complex. Awake.  Myoview after 2014 showed inferolateral, and anterolateral scar without ischemia. Is on Coreg 25 mg split twice a day.   DATE TEST    2008 echo EF40-45%   1/14    echo   EF 15 %   8/17    echo   EF 25 %    History of Paroxysmal  A. fib. Takes Amiodarone 200mg /day. Was previously on Xarelto. Had a GI bleed. Now continues  On Eloquis.   HPI  Past Medical History:  Diagnosis Date  . A-fib ([place])    a. Noted on 12/2012 interrogation, placed on Apixaban and ultimately stopped NOACs 2/2 personal decision 8/14.  . Arthritis   . Atrial tachycardia-non sustained    a. Noted on 03/2012 interrogation.  . Cardiac defibrillator -dual  [facility]   . Chronic systolic heart failure (HCC)    EF down to 20 to 25% per echo November 2012  . COPD (chronic obstructive pulmonary disease) ([place])   .  Hyperlipidemia   . Hypertension   . ICD (implantable cardiac defibrillator) in place   . NICM (nonischemic cardiomyopathy) ([place])    a. Normal cors 2000. b. Minimal plaque 2013.  . Old myocardial infarction    a. ?Silent MI. b. Large fixed inferolateral defect c/w with prior infarct, no ischemia. c. Cath in 2000/2013 with only minimal CAD.  Marland Kitchen Pulmonary nodule, right    Last scan in 2010 showing stability; felt to be benign.  Marland Kitchen PVC's (premature ventricular contractions)   . Ventricular tachycardia, polymorphic ([place])    Rx via ICD 12/14    Patient Active Problem List   Diagnosis Date Noted  . VT (ventricular tachycardia) ([place]) 11/02/2016  . Benign fibroma of prostate 04/02/2015  . Ventricular tachycardia, polymorphic ([place])   . Diverticulosis of colon without hemorrhage 09/25/2013  . Atrial fibrillation ([place]) 12/29/2012  . Cardiac defibrillator -dual  [facility]   . Hyperlipidemia   . PVCs 11/26/2011  . Chronic systolic heart failure ([place]) 10/07/2011  . Cardiomyopathy, nonischemic and ischemic 09/24/2011  . HYPERTENSION 12/18/2009  . 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 PULMONARY NODULE 12/04/2008    Past Surgical History:  Procedure Laterality Date  . APPENDECTOMY    . CARDIAC CATHETERIZATION  2000  . CARDIAC DEFIBRILLATOR PLACEMENT    . CARDIOVERSION  03/30/2013   Procedure: CARDIOVERSION;  Surgeon: Thayer Headings, MD;  Location: [facility] ENDOSCOPY;  Service: Cardiovascular;;  . COLON SURGERY    . COLONOSCOPY N/A 09/25/2013   Procedure: COLONOSCOPY;  Surgeon: Jerene Bears, MD;  Location: [facility] ENDOSCOPY;  Service: Endoscopy;  Laterality: N/A;  . ICD  12/2011   [name]  . IMPLANTABLE CARDIOVERTER DEFIBRILLATOR IMPLANT N/A 01/06/2012   Procedure: IMPLANTABLE CARDIOVERTER DEFIBRILLATOR IMPLANT;  Surgeon: Deboraha Sprang, MD;  Location: [facility] CATH LAB;  Service: Cardiovascular;  Laterality: N/A;  . TEE WITHOUT CARDIOVERSION N/A 03/30/2013   Procedure:  TRANSESOPHAGEAL ECHOCARDIOGRAM (TEE);  Surgeon: Thayer Headings, MD;  Location: [facility];  Service: Cardiovascular;  Laterality: N/A;       Home Medications    Prior to Admission medications   Medication Sig Start Date End Date Taking? Authorizing Provider  albuterol (PROVENTIL) (2.5 MG/3ML) 0.083% nebulizer solution Take 3 mLs (2.5 mg total) by nebulization every 6 (six) hours as needed for wheezing or shortness of breath. 01/14/15  Yes Elsie Stain, MD  Albuterol Sulfate (PROAIR RESPICLICK) [id number] (90 BASE) MCG/ACT AEPB Inhale 2 puffs into the lungs every 6 (six) hours as needed. Patient taking differently: Inhale 2 puffs into the lungs every 6 (six) hours as needed (shortness of breath/ wheezing).  09/17/15  Yes Tammy S Parrett, NP  amiodarone (PACERONE) 200 MG tablet TAKE ONE TABLET BY MOUTH ONCE DAILY 05/04/16  Yes Mihai Croitoru, MD  carvedilol (COREG) 12.5 MG tablet TAKE ONE TABLET BY MOUTH TWICE DAILY WITH MEALS 10/06/16  Yes Mihai Croitoru, MD  cholecalciferol (VITAMIN D) 1000 UNITS tablet Take 1,000 Units by mouth daily.   Yes Historical Provider, MD  ELIQUIS 5 MG TABS tablet TAKE 1 TABLET BY MOUTH TWICE A DAY 07/30/15  Yes Minus Breeding, MD  finasteride (PROSCAR) 5 MG tablet Take 5 mg by mouth daily.    Yes Historical Provider, MD  Fluticasone-Salmeterol (ADVAIR DISKUS) 250-50 MCG/DOSE AEPB Inhale 1 puff into the lungs 2 (two) times daily. Patient taking differently: Inhale 1 puff into the lungs daily.  07/08/15  Yes Elsie Stain, MD  furosemide (LASIX) 40 MG tablet TAKE ONE TABLET BY MOUTH ONCE DAILY 09/23/16  Yes Minus Breeding, MD  levothyroxine (SYNTHROID, LEVOTHROID) 112 MCG tablet Take 112 mcg by mouth daily before breakfast.    Yes Historical Provider, MD  loratadine (CLARITIN) 10 MG tablet Take 10 mg by mouth daily.   Yes Historical Provider, MD  losartan (COZAAR) 100 MG tablet Take 50 mg by mouth 2 (two) times daily.   Yes Historical Provider, MD  Melatonin 5 MG CAPS  Take 5 mg by mouth at bedtime.    Yes Historical Provider, MD  Multiple Vitamin (MULTIVITAMIN WITH MINERALS) TABS tablet Take 1 tablet by mouth daily.   Yes Historical Provider, MD  nitroGLYCERIN (NITROSTAT) 0.4 MG SL tablet Place 0.4 mg under the tongue every 5 (five) minutes as needed for chest pain.  11/12/11  Yes Burtis Junes, NP  OXYGEN Inhale 2 L into the lungs See admin instructions. Use oxygen whenever resting   Yes Historical Provider, MD  pantoprazole (PROTONIX) 40 MG tablet Take 1 tablet (40 mg total) by mouth daily. 07/21/16  Yes Amy S Esterwood, PA-C  POTASSIUM PO Take 1 tablet by mouth at bedtime.   Yes Historical Provider, MD  Psyllium (METAMUCIL PO) Take 15 mLs by mouth daily. Mix in liquid and drink   Yes Historical Provider, MD  Tamsulosin HCl (FLOMAX) 0.4 MG CAPS Take 0.4 mg by mouth at bedtime.    Yes Historical Provider,  MD  promethazine (PHENERGAN) 25 MG tablet Take 1 tablet (25 mg total) by mouth every 6 (six) hours as needed for nausea or vomiting. Patient not taking: Reported on 11/02/2016 07/21/16   Alfredia Ferguson, PA-C    Family History Family History  Problem Relation Age of Onset  . Emphysema Mother   . Heart disease Mother     AVR  . Heart failure Father     Died agwe 70    Social History Social History  Substance Use Topics  . Smoking status: Former Smoker    Packs/day: 2.50    Years: 40.00    Types: Cigarettes    Quit date: 11/24/1983  . Smokeless tobacco: Never Used     Comment: 2 1/2 ppd x 40 years  . Alcohol use No     Allergies   Xarelto [rivaroxaban]; Adhesive [tape]; Atorvastatin; Codeine; Isosorbide nitrate; Latex; Levofloxacin; Lisinopril; Lorazepam; Sertraline; and Tiotropium bromide monohydrate   Review of Systems Review of Systems  Constitutional: Negative for appetite change, chills, diaphoresis, fatigue and fever.  HENT: Negative for mouth sores, sore throat and trouble swallowing.   Eyes: Negative for visual disturbance.    Respiratory: Negative for cough, chest tightness, shortness of breath and wheezing.   Cardiovascular: Positive for chest pain and palpitations.  Gastrointestinal: Negative for abdominal distention, abdominal pain, diarrhea, nausea and vomiting.  Endocrine: Negative for polydipsia, polyphagia and polyuria.  Genitourinary: Negative for dysuria, frequency and hematuria.  Musculoskeletal: Negative for gait problem.  Skin: Negative for color change, pallor and rash.  Neurological: Positive for weakness and light-headedness. Negative for dizziness, syncope and headaches.  Hematological: Does not bruise/bleed easily.  Psychiatric/Behavioral: Negative for behavioral problems and confusion.     Physical Exam Updated Vital Signs BP 130/77 (BP Location: Right Arm)   Pulse 60   Temp 98 F (36.7 C) (Oral)   Resp 16   SpO2 92%   Physical Exam  Constitutional: He is oriented to person, place, and time. He appears well-developed and well-nourished. No distress.  HENT:  Head: Normocephalic.  Eyes: Conjunctivae are normal. Pupils are equal, round, and reactive to light. No scleral icterus.  Neck: Normal range of motion. Neck supple. No thyromegaly present.  Cardiovascular: Exam reveals no gallop and no friction rub.   No murmur heard. Tachycardic. Wide complex on monitor. Palpable pulses.  Pulmonary/Chest: Effort normal and breath sounds normal. No respiratory distress. He has no wheezes. He has no rales.  Abdominal: Soft. Bowel sounds are normal. He exhibits no distension. There is no tenderness. There is no rebound.  Musculoskeletal: Normal range of motion.  Neurological: He is alert and oriented to person, place, and time.  Skin: Skin is warm and dry. No rash noted.  Psychiatric: He has a normal mood and affect. His behavior is normal.     ED Treatments / Results  Labs (all labs ordered are listed, but only abnormal results are displayed) Labs Reviewed  CBC WITH DIFFERENTIAL/PLATELET -  Abnormal; Notable for the following:       Result Value   WBC 11.6 (*)    Neutro Abs 8.5 (*)    All other components within normal limits  BASIC METABOLIC PANEL - Abnormal; Notable for the following:    Glucose, Bld 131 (*)    BUN 22 (*)    Creatinine, Ser 1.61 (*)    GFR calc non Af Amer 40 (*)    GFR calc Af Amer 46 (*)    All other components within  normal limits  MRSA PCR SCREENING  MAGNESIUM  BASIC METABOLIC PANEL  CBC  I-STAT TROPOININ, ED    EKG  EKG Interpretation  Date/Time:  Monday November 02 2016 18:06:51 EST Ventricular Rate:  151 PR Interval:    QRS Duration: 192 QT Interval:  330 QTC Calculation: 523 R Axis:   110 Text Interpretation:  Wide complex techycardia Right bundle branch block Lateral infarct , age undetermined T wave abnormality, consider inferior ischemia Abnormal ECG Confirmed by Jeneen Rinks  MD, [place] ([id number]) on 11/02/2016 6:14:27 PM Also confirmed by Jeneen Rinks  MD, [place] ([id number])  on 11/02/2016 6:33:59 PM       Radiology Dg Chest Port 1 View  Result Date: 11/02/2016 CLINICAL DATA:  Shortness of Breath EXAM: PORTABLE CHEST 1 VIEW COMPARISON:  07/06/2016 FINDINGS: Cardiac shadow is mildly enlarged. A defibrillator is again seen and stable. The lungs are well aerated bilaterally. No focal infiltrate or sizable effusion is seen. No bony abnormality is noted. IMPRESSION: No acute abnormality seen. Electronically Signed   By: Inez Catalina M.D.   On: 11/02/2016 19:19    Procedures Procedures (including critical care time)  Medications Ordered in ED Medications  amiodarone (NEXTERONE) 1.8 mg/mL load via infusion 150 mg (150 mg Intravenous Bolus from Bag 11/02/16 1837)    Followed by  amiodarone (NEXTERONE PREMIX) 360-4.14 MG/200ML-% (1.8 mg/mL) IV infusion (60 mg/hr Intravenous New Bag/Given 11/02/16 2006)  furosemide (LASIX) tablet 40 mg (not administered)  pantoprazole (PROTONIX) EC tablet 40 mg (not administered)  promethazine (PHENERGAN) tablet 25 mg  (not administered)  apixaban (ELIQUIS) tablet 5 mg (5 mg Oral Given 11/02/16 2233)  loratadine (CLARITIN) tablet 10 mg (not administered)  Melatonin TABS 6 mg (not administered)  mometasone-formoterol (DULERA) 200-5 MCG/ACT inhaler 2 puff (2 puffs Inhalation Not Given 11/02/16 2200)  levothyroxine (SYNTHROID, LEVOTHROID) tablet 112 mcg (not administered)  multivitamin with minerals tablet 1 tablet (not administered)  albuterol (PROVENTIL) (2.5 MG/3ML) 0.083% nebulizer solution 2.5 mg (not administered)  cholecalciferol (VITAMIN D) tablet 1,000 Units (not administered)  finasteride (PROSCAR) tablet 5 mg (not administered)  nitroGLYCERIN (NITROSTAT) SL tablet 0.4 mg (not administered)  psyllium (HYDROCIL/METAMUCIL) packet 1 packet (not administered)  tamsulosin (FLOMAX) capsule 0.4 mg (0.4 mg Oral Given 11/02/16 2234)  aspirin EC tablet 81 mg (not administered)  acetaminophen (TYLENOL) tablet 650 mg (not administered)  ondansetron (ZOFRAN) injection 4 mg (not administered)  losartan (COZAAR) tablet 50 mg (50 mg Oral Given 11/02/16 2234)  carvedilol (COREG) tablet 12.5 mg (12.5 mg Oral Given 11/02/16 2234)  zolpidem (AMBIEN) tablet 5 mg (5 mg Oral Given 11/02/16 2305)  MEDLINE mouth rinse (not administered)  amiodarone (NEXTERONE) 1.8 mg/mL load via infusion 150 mg (150 mg Intravenous Bolus from Bag 11/02/16 1848)  0.9 %  sodium chloride infusion (1,000 mLs Intravenous New Bag/Given 11/02/16 1851)     Initial Impression / Assessment and Plan / ED Course  I have reviewed the triage vital signs and the nursing notes.  Pertinent labs & imaging results that were available during my care of the patient were reviewed by me and considered in my medical decision making (see chart for details).  Clinical Course     EKG interpreted by myself as wide Tachycardia. Likely ventricular tachycardia. Differential diagnosis also includes atrial fibrillation/atrial flutter with aberrant conduction. Does  not have pre-existing bundle-branch block. Given amiodarone bolus here milligrams. Started on an infusion 1 mg/m. Discussed the case with Dr. Domenic Polite. He agrees to current therapy. We'll see the patient in consult.  Patient converts to a sinus rhythm and his symptoms resolve and improve.  Interrogation shows that his device is set to record only atrial rates above 150, ventricular rate's above 200. No events or rhythms recorded today. Patient being admitted to a telemetry bed under the care of the cardiology service and Dr. Domenic Polite  Final Clinical Impressions(s) / ED Diagnoses   Final diagnoses:  Ventricular tachycardia ([place])    CRITICAL CARE Performed by: Tanna Furry JOSEPH   Total critical care time: 30 minutes  Critical care time was exclusive of separately billable procedures and treating other patients.  Critical care was necessary to treat or prevent imminent or life-threatening deterioration.  Critical care was time spent personally by me on the following activities: development of treatment plan with patient and/or surrogate as well as nursing, discussions with consultants, evaluation of patient's response to treatment, examination of patient, obtaining history from patient or surrogate, ordering and performing treatments and interventions, ordering and review of laboratory studies, ordering and review of radiographic studies, pulse oximetry and re-evaluation of patient's condition.   New Prescriptions Current Discharge Medication List       Tanna Furry, MD 11/03/16 [phone]

## 2016-11-02 NOTE — ED Notes (Signed)
EDP at bedside  

## 2016-11-02 NOTE — ED Triage Notes (Signed)
Pt. Stated. I started with a cold congestion and the doctor put me on a Z-Pak and some Prednisone, and today I started having the high heartbeat feeling a little weak.

## 2016-11-03 DIAGNOSIS — I472 Ventricular tachycardia: Secondary | ICD-10-CM | POA: Diagnosis not present

## 2016-11-03 DIAGNOSIS — J449 Chronic obstructive pulmonary disease, unspecified: Secondary | ICD-10-CM | POA: Diagnosis present

## 2016-11-03 DIAGNOSIS — Z91040 Latex allergy status: Secondary | ICD-10-CM | POA: Diagnosis not present

## 2016-11-03 DIAGNOSIS — I480 Paroxysmal atrial fibrillation: Secondary | ICD-10-CM | POA: Diagnosis present

## 2016-11-03 DIAGNOSIS — I252 Old myocardial infarction: Secondary | ICD-10-CM | POA: Diagnosis not present

## 2016-11-03 DIAGNOSIS — Z886 Allergy status to analgesic agent status: Secondary | ICD-10-CM | POA: Diagnosis not present

## 2016-11-03 DIAGNOSIS — Z7901 Long term (current) use of anticoagulants: Secondary | ICD-10-CM | POA: Diagnosis not present

## 2016-11-03 DIAGNOSIS — Z7951 Long term (current) use of inhaled steroids: Secondary | ICD-10-CM | POA: Diagnosis not present

## 2016-11-03 DIAGNOSIS — I5022 Chronic systolic (congestive) heart failure: Secondary | ICD-10-CM

## 2016-11-03 DIAGNOSIS — I10 Essential (primary) hypertension: Secondary | ICD-10-CM | POA: Diagnosis not present

## 2016-11-03 DIAGNOSIS — Z79899 Other long term (current) drug therapy: Secondary | ICD-10-CM | POA: Diagnosis not present

## 2016-11-03 DIAGNOSIS — I2510 Atherosclerotic heart disease of native coronary artery without angina pectoris: Secondary | ICD-10-CM | POA: Diagnosis present

## 2016-11-03 DIAGNOSIS — Z9889 Other specified postprocedural states: Secondary | ICD-10-CM | POA: Diagnosis not present

## 2016-11-03 DIAGNOSIS — Z888 Allergy status to other drugs, medicaments and biological substances status: Secondary | ICD-10-CM | POA: Diagnosis not present

## 2016-11-03 DIAGNOSIS — Z87891 Personal history of nicotine dependence: Secondary | ICD-10-CM | POA: Diagnosis not present

## 2016-11-03 DIAGNOSIS — I428 Other cardiomyopathies: Secondary | ICD-10-CM | POA: Diagnosis not present

## 2016-11-03 DIAGNOSIS — I429 Cardiomyopathy, unspecified: Secondary | ICD-10-CM | POA: Diagnosis present

## 2016-11-03 DIAGNOSIS — Z825 Family history of asthma and other chronic lower respiratory diseases: Secondary | ICD-10-CM | POA: Diagnosis not present

## 2016-11-03 DIAGNOSIS — Z8249 Family history of ischemic heart disease and other diseases of the circulatory system: Secondary | ICD-10-CM | POA: Diagnosis not present

## 2016-11-03 DIAGNOSIS — I110 Hypertensive heart disease with heart failure: Secondary | ICD-10-CM | POA: Diagnosis present

## 2016-11-03 DIAGNOSIS — Z95810 Presence of automatic (implantable) cardiac defibrillator: Secondary | ICD-10-CM | POA: Diagnosis not present

## 2016-11-03 DIAGNOSIS — Z9049 Acquired absence of other specified parts of digestive tract: Secondary | ICD-10-CM | POA: Diagnosis not present

## 2016-11-03 LAB — BASIC METABOLIC PANEL
Anion gap: 8 (ref 5–15)
BUN: 18 mg/dL (ref 6–20)
CO2: 29 mmol/L (ref 22–32)
Calcium: 8.7 mg/dL — ABNORMAL LOW (ref 8.9–10.3)
Chloride: 102 mmol/L (ref 101–111)
Creatinine, Ser: 1.29 mg/dL — ABNORMAL HIGH (ref 0.61–1.24)
GFR calc Af Amer: >60 mL/min (ref >60)
GFR, EST NON AFRICAN AMERICAN: 52 mL/min — AB (ref >60)
Glucose, Bld: 125 mg/dL — ABNORMAL HIGH (ref 65–99)
Potassium: 4 mmol/L (ref 3.5–5.1)
SODIUM: 139 mmol/L (ref 135–145)

## 2016-11-03 LAB — CBC
HEMATOCRIT: 43.2 % (ref 39.0–52.0)
Hemoglobin: 14.1 g/dL (ref 13.0–17.0)
MCH: 31.1 pg (ref 26.0–34.0)
MCHC: 32.6 g/dL (ref 30.0–36.0)
MCV: 95.2 fL (ref 78.0–100.0)
PLATELETS: 164 10*3/uL (ref 150–400)
RBC: 4.54 MIL/uL (ref 4.22–5.81)
RDW: 15.2 % (ref 11.5–15.5)
WBC: 7.8 10*3/uL (ref 4.0–10.5)

## 2016-11-03 LAB — MRSA PCR SCREENING: MRSA BY PCR: NEGATIVE

## 2016-11-03 MED ORDER — AMIODARONE HCL IN DEXTROSE 360-4.14 MG/200ML-% IV SOLN
30.0000 mg/h | INTRAVENOUS | Status: DC
  Administered 2016-11-03 (×2): 30 mg/h via INTRAVENOUS
  Filled 2016-11-03 (×3): qty 200

## 2016-11-03 NOTE — Progress Notes (Signed)
Report received via Estill Bamberg RN in patient's room using SBAR format, reviewed orders, labs, VS, meds and patient's general condition, assumed care of patient.

## 2016-11-03 NOTE — Progress Notes (Signed)
Call placed to Dr. Billee Cashing Clean regarding the patient's Amiodarone IV, as far as orders go the Amiodarone drip is to be stopped at 00:45 and it'd running at 60mg /hr=33.3cc/hr. Patient is in a NSR and is paced in the 60's with B/P 110's/-130's/70-80's, will turn the Amiodarone drip down to 30 mg/hr or 16.7cc/hr as per MD and run at this rate until D/C'd by MD, will continue to monitor.

## 2016-11-03 NOTE — Progress Notes (Signed)
Patient Name: Corey Tyler      SUBJECTIVE: Admitted 12/11 with Monomorphic VT  ECG was reviewed monophasic R-wave in lead V1    Had tachypalps and LH; never before  Has had edema of late  Car no way this occasion didn't have seatbelts precordial concordance  Hx of NICM CAD by cath 2013 with interval progressive LV dysfunction      DATE TEST    2008 echo EF40-45%   2013 Cath  No obstructive CAD  1/14    echo   EF 15 %   1/15 myoview   EF NG Large inferior scar without ischemia--unchanged 2012  8/17    echo   EF 25 %      Past Medical History:  Diagnosis Date  . A-fib ([place])    a. Noted on 12/2012 interrogation, placed on Apixaban and ultimately stopped NOACs 2/2 personal decision 8/14.  . Arthritis   . Atrial tachycardia-non sustained    a. Noted on 03/2012 interrogation.  . Cardiac defibrillator -dual  [facility]   . Chronic systolic heart failure (HCC)    EF down to 20 to 25% per echo November 2012  . COPD (chronic obstructive pulmonary disease) ([place])   . Hyperlipidemia   . Hypertension   . ICD (implantable cardiac defibrillator) in place   . NICM (nonischemic cardiomyopathy) ([place])    a. Normal cors 2000. b. Minimal plaque 2013.  . Old myocardial infarction    a. ?Silent MI. b. Large fixed inferolateral defect c/w with prior infarct, no ischemia. c. Cath in 2000/2013 with only minimal CAD.  Marland Kitchen Pulmonary nodule, right    Last scan in 2010 showing stability; felt to be benign.  Marland Kitchen PVC's (premature ventricular contractions)   . Ventricular tachycardia, polymorphic ([place])    Rx via ICD 12/14    Scheduled Meds:  Scheduled Meds: . apixaban  5 mg Oral BID  . aspirin EC  81 mg Oral Daily  . carvedilol  12.5 mg Oral BID WC  . cholecalciferol  1,000 Units Oral Daily  . finasteride  5 mg Oral q morning - 10a  . furosemide  40 mg Oral Daily  . levothyroxine  112 mcg Oral QAC breakfast  . loratadine  10 mg Oral Daily  . losartan  50 mg Oral BID  .  mouth rinse  15 mL Mouth Rinse BID  . mometasone-formoterol  2 puff Inhalation BID  . multivitamin with minerals  1 tablet Oral Daily  . pantoprazole  40 mg Oral Daily  . psyllium  1 packet Oral Daily  . tamsulosin  0.4 mg Oral QHS   Continuous Infusions: . amiodarone 30 mg/hr (11/03/16 0237)   acetaminophen, albuterol, Melatonin, nitroGLYCERIN, ondansetron (ZOFRAN) IV, promethazine, zolpidem    PHYSICAL EXAM Vitals:   11/03/16 0520 11/03/16 0535 11/03/16 0605 11/03/16 0749  BP: (!) 141/79 139/73 (!) 138/111 (!) 148/86  Pulse: (!) 59 65 64   Resp: 17 18 18    Temp:      TempSrc:      SpO2: 94% 96% 93%   Weight:      Height:        Well developed and nourished in no acute distress HENT normal Neck supple with JVP-flat Clear Regular rate and rhythm, no murmurs or gallops Abd-soft with active BS No Clubbing cyanosis edema Skin-warm and dry A & Oriented  Grossly normal sensory and motor function   TELEMETRY: Reviewed personnally pt in sinus  Rare pvc  No VT:  ECG personally reviewed As above   Intake/Output Summary (Last 24 hours) at 11/03/16 0756 Last data filed at 11/03/16 0600  Gross per 24 hour  Intake            450.1 ml  Output              600 ml  Net           -149.9 ml    LABS: Basic Metabolic Panel:  Recent Labs Lab 11/02/16 1835 11/03/16 0454  NA 139 139  K 3.8 4.0  CL 103 102  CO2 24 29  GLUCOSE 131* 125*  BUN 22* 18  CREATININE 1.61* 1.29*  CALCIUM 9.2 8.7*  MG 2.3  --    Cardiac Enzymes: No results for input(s): CKTOTAL, CKMB, CKMBINDEX, TROPONINI in the last 72 hours. CBC:  Recent Labs Lab 11/02/16 1835 11/03/16 0454  WBC 11.6* 7.8  NEUTROABS 8.5*  --   HGB 15.2 14.1  HCT 45.1 43.2  MCV 93.2 95.2  PLT 181 164   PROTIME: No results for input(s): LABPROT, INR in the last 72 hours. Liver Function Tests: No results for input(s): AST, ALT, ALKPHOS, BILITOT, PROT, ALBUMIN in the last 72 hours. No results for input(s): LIPASE,  AMYLASE in the last 72 hours. BNP: BNP (last 3 results)  Recent Labs  04/08/16 1000 07/06/16 1714  BNP 313.8* 188.0*    ProBNP (last 3 results) No results for input(s): PROBNP in the last 8760 hours.  D-Dimer: No results for input(s): DDIMER in the last 72 hours. Hemoglobin A1C: No results for input(s): HGBA1C in the last 72 hours. Fasting Lipid Panel: No results for input(s): CHOL, HDL, LDLCALC, TRIG, CHOLHDL, LDLDIRECT in the last 72 hours. Thyroid Function Tests: No results for input(s): TSH, T4TOTAL, T3FREE, THYROIDAB in the last 72 hours.  Invalid input(s): FREET3 Anemia Panel: No results for input(s): VITAMINB12, FOLATE, FERRITIN, TIBC, IRON, RETICCTPCT in the last 72 hours.   Device Interrogation: reveiwed personally No evidence of rapid atrial rates   ASSESSMENT AND PLAN:  Active Problems:   Chronic systolic heart failure (HCC)   Cardiomyopathy, nonischemic and ischemic   Cardiac defibrillator -dual  [facility]   VT (ventricular tachycardia) (HCC)  ECG most consistent with VT Monomorphic No clear trigger Will continue amio with rebolus Anticipate discharge tomorrow   Signed, Virl Axe MD  11/03/2016

## 2016-11-04 DIAGNOSIS — I10 Essential (primary) hypertension: Secondary | ICD-10-CM

## 2016-11-04 MED ORDER — AMIODARONE HCL 400 MG PO TABS: 400.0000 mg | ORAL_TABLET | Freq: Two times a day (BID) | ORAL | 1 refills | Status: DC

## 2016-11-04 MED ORDER — CARVEDILOL 25 MG PO TABS: 25.0000 mg | ORAL_TABLET | Freq: Two times a day (BID) | ORAL | Status: DC

## 2016-11-04 MED ORDER — AMIODARONE HCL 200 MG PO TABS
400.0000 mg | ORAL_TABLET | Freq: Two times a day (BID) | ORAL | Status: DC
  Administered 2016-11-04: 400 mg via ORAL
  Filled 2016-11-04: qty 2

## 2016-11-04 NOTE — Plan of Care (Signed)
Problem: Education: Goal: Knowledge of [facility] Education information/materials will improve Outcome: Completed/Met Date Met: 11/04/16 Pt educated throughout entire hospitalization regarding tests, procedures, medications, and available resources.   Problem: Safety: Goal: Ability to remain free from injury will improve Outcome: Completed/Met Date Met: 11/04/16 Pt has remained free from injury during this admission   Problem: Physical Regulation: Goal: Will remain free from infection Outcome: Completed/Met Date Met: 11/04/16 Pt has remained free from infection during this admission   Problem: Fluid Volume: Goal: Ability to maintain a balanced intake and output will improve Outcome: Completed/Met Date Met: 11/04/16 Pt has adequate intake and output   Problem: Nutrition: Goal: Adequate nutrition will be maintained Outcome: Completed/Met Date Met: 11/04/16 Pt has adequate nutrition

## 2016-11-04 NOTE — Discharge Instructions (Signed)
Pt may return to work without restrictions on Monday, December 18th

## 2016-11-04 NOTE — Progress Notes (Signed)
Patient Name: Corey Tyler      SUBJECTIVE: Admitted 12/11 with Monomorphic VT  ECG was reviewed monophasic R-wave in lead V1    Had tachypalps and LH; never before  Has had edema of late  Car no way this occasion didn't have seatbelts precordial concordance  Hx of NICM CAD by cath 2013 with interval progressive LV dysfunction      DATE TEST    2008 echo EF40-45%   2013 Cath  No obstructive CAD  1/14    echo   EF 15 %   1/15 myoview   EF NG Large inferior scar without ischemia--unchanged 2012  8/17    echo   EF 25 %      Past Medical History:  Diagnosis Date  . A-fib ([place])    a. Noted on 12/2012 interrogation, placed on Apixaban and ultimately stopped NOACs 2/2 personal decision 8/14.  . Arthritis   . Atrial tachycardia-non sustained    a. Noted on 03/2012 interrogation.  . Cardiac defibrillator -dual  [facility]   . Chronic systolic heart failure (HCC)    EF down to 20 to 25% per echo November 2012  . COPD (chronic obstructive pulmonary disease) ([place])   . Hyperlipidemia   . Hypertension   . ICD (implantable cardiac defibrillator) in place   . NICM (nonischemic cardiomyopathy) ([place])    a. Normal cors 2000. b. Minimal plaque 2013.  . Old myocardial infarction    a. ?Silent MI. b. Large fixed inferolateral defect c/w with prior infarct, no ischemia. c. Cath in 2000/2013 with only minimal CAD.  Marland Kitchen Pulmonary nodule, right    Last scan in 2010 showing stability; felt to be benign.  Marland Kitchen PVC's (premature ventricular contractions)   . Ventricular tachycardia, polymorphic ([place])    Rx via ICD 12/14    Scheduled Meds:  Scheduled Meds: . amiodarone  400 mg Oral BID  . apixaban  5 mg Oral BID  . aspirin EC  81 mg Oral Daily  . carvedilol  12.5 mg Oral BID WC  . cholecalciferol  1,000 Units Oral Daily  . finasteride  5 mg Oral q morning - 10a  . furosemide  40 mg Oral Daily  . levothyroxine  112 mcg Oral QAC breakfast  . loratadine  10 mg Oral Daily  .  losartan  50 mg Oral BID  . mouth rinse  15 mL Mouth Rinse BID  . mometasone-formoterol  2 puff Inhalation BID  . multivitamin with minerals  1 tablet Oral Daily  . pantoprazole  40 mg Oral Daily  . psyllium  1 packet Oral Daily  . tamsulosin  0.4 mg Oral QHS   Continuous Infusions:  acetaminophen, albuterol, Melatonin, nitroGLYCERIN, ondansetron (ZOFRAN) IV, promethazine, zolpidem    PHYSICAL EXAM Vitals:   11/03/16 2036 11/04/16 0030 11/04/16 0500 11/04/16 0812  BP: (!) 150/89 131/80 (!) 143/52 (!) 150/96  Pulse: 61 64 63 72  Resp: 19 16 16 20   Temp: 97.9 F (36.6 C) 98.7 F (37.1 C) 98.2 F (36.8 C) 98.1 F (36.7 C)  TempSrc: Oral Oral Oral Oral  SpO2: 92% 96% 93% 91%  Weight:   192 lb 4.8 oz (87.2 kg)   Height:        Well developed and nourished in no acute distress HENT normal Neck supple with JVP-flat Clear Regular rate and rhythm, no murmurs or gallops Abd-soft with active BS No Clubbing cyanosis edema Skin-warm and dry A &  Oriented  Grossly normal sensory and motor function   TELEMETRY: Reviewed personnally pt in sinus  Rare pvc  No VT: Some Vpacing in QRS device function reassessed and normal   ECG personally reviewed As above   Intake/Output Summary (Last 24 hours) at 11/04/16 0843 Last data filed at 11/04/16 0800  Gross per 24 hour  Intake              620 ml  Output             2300 ml  Net            -1680 ml    LABS: Basic Metabolic Panel:  Recent Labs Lab 11/02/16 1835 11/03/16 0454  NA 139 139  K 3.8 4.0  CL 103 102  CO2 24 29  GLUCOSE 131* 125*  BUN 22* 18  CREATININE 1.61* 1.29*  CALCIUM 9.2 8.7*  MG 2.3  --    Cardiac Enzymes: No results for input(s): CKTOTAL, CKMB, CKMBINDEX, TROPONINI in the last 72 hours. CBC:  Recent Labs Lab 11/02/16 1835 11/03/16 0454  WBC 11.6* 7.8  NEUTROABS 8.5*  --   HGB 15.2 14.1  HCT 45.1 43.2  MCV 93.2 95.2  PLT 181 164   PROTIME: No results for input(s): LABPROT, INR in the last  72 hours. Liver Function Tests: No results for input(s): AST, ALT, ALKPHOS, BILITOT, PROT, ALBUMIN in the last 72 hours. No results for input(s): LIPASE, AMYLASE in the last 72 hours. BNP: BNP (last 3 results)  Recent Labs  04/08/16 1000 07/06/16 1714  BNP 313.8* 188.0*    ProBNP (last 3 results) No results for input(s): PROBNP in the last 8760 hours.  D-Dimer: No results for input(s): DDIMER in the last 72 hours. Hemoglobin A1C: No results for input(s): HGBA1C in the last 72 hours. Fasting Lipid Panel: No results for input(s): CHOL, HDL, LDLCALC, TRIG, CHOLHDL, LDLDIRECT in the last 72 hours. Thyroid Function Tests: No results for input(s): TSH, T4TOTAL, T3FREE, THYROIDAB in the last 72 hours.  Invalid input(s): FREET3 Anemia Panel: No results for input(s): VITAMINB12, FOLATE, FERRITIN, TIBC, IRON, RETICCTPCT in the last 72 hours.   Device Interrogation: reveiwed personally No evidence of rapid atrial rates   ASSESSMENT AND PLAN:  Active Problems:   Chronic systolic heart failure (HCC)   Cardiomyopathy, nonischemic and ischemic   Cardiac defibrillator -dual  [facility]   VT (ventricular tachycardia) (HCC)  ECG most consistent with VT Monomorphic No clear trigger amio to po Augment coreg with BP Discharge today Will reprogram ICD VT [id number] ATP in zone one but no shocks Will need outpt GXT to exclude sinus rate overlap  Signed, Virl Axe MD  11/04/2016

## 2016-11-04 NOTE — Discharge Summary (Signed)
ELECTROPHYSIOLOGY PROCEDURE DISCHARGE SUMMARY    Patient ID: Corey Tyler,  MRN: [id number], DOB/AGE: Jun 23, 1941 75 y.o.  Admit date: 11/02/2016 Discharge date: 11/04/2016  Primary Care Physician: [name], MD Primary Cardiologist: [place] Electrophysiologist: [name]   Primary Discharge Diagnosis:  Active Problems:   Chronic systolic heart failure ([place])   Cardiomyopathy, nonischemic and ischemic   Cardiac defibrillator -dual  [facility]   VT (ventricular tachycardia) (HCC)   Allergies  Allergen Reactions  . Xarelto [Rivaroxaban] Other (See Comments)    Bleeding  . Adhesive [Tape] Hives, Itching and Rash  . Atorvastatin Cough  . Codeine     nauseated  . Isosorbide Nitrate Other (See Comments)    Made him feel bad  . Latex Itching and Other (See Comments)    Rash, itching, burning  . Levofloxacin Other (See Comments)    Tachycardia  . Lisinopril Cough  . Lorazepam Other (See Comments)    "felt bad"  . Sertraline Other (See Comments)    Made him feel bad  . Tiotropium Bromide Monohydrate Swelling    Throat swelling   Brief HPI/Hospital Course:  Corey Tyler is a 75 y.o. male with a past medical history significant for NICM with STJ ICD, hypertension, hyperlipidemia, paroxysmal atrial fibrillation, COPD, and ventricular tachycardia who presented to the hospital the day of admission with weakness, chest pain, and shortness of breath. He was found to be in ventricular tachycardia below his detection and was started on IV amiodarone with conversion to SR.  His home amiodarone dose was increased and his device was reprogrammed. He has been monitored on telemetry with no further VT. He has been seen by Dr [name] today who feels that he is stable for discharge to home.  He will need outpatient GXT arranged to be sure that sinus rates do not overlap into VT zone.   Physical Exam: Vitals:   11/03/16 2036 11/04/16 0030 11/04/16 0500 11/04/16 0812  BP: (!) 150/89 131/80 (!)  143/52 (!) 150/96  Pulse: 61 64 63 72  Resp: 19 16 16 20   Temp: 97.9 F (36.6 C) 98.7 F (37.1 C) 98.2 F (36.8 C) 98.1 F (36.7 C)  TempSrc: Oral Oral Oral Oral  SpO2: 92% 96% 93% 91%  Weight:   192 lb 4.8 oz (87.2 kg)   Height:        Labs:   Lab Results  Component Value Date   WBC 7.8 11/03/2016   HGB 14.1 11/03/2016   HCT 43.2 11/03/2016   MCV 95.2 11/03/2016   PLT 164 11/03/2016     Recent Labs Lab 11/03/16 0454  NA 139  K 4.0  CL 102  CO2 29  BUN 18  CREATININE 1.29*  CALCIUM 8.7*  GLUCOSE 125*     Discharge Medications:  Current Discharge Medication List    CONTINUE these medications which have CHANGED   Details  amiodarone (PACERONE) 400 MG tablet Take 1 tablet (400 mg total) by mouth 2 (two) times daily. Qty: 60 tablet, Refills: 1      CONTINUE these medications which have NOT CHANGED   Details  albuterol (PROVENTIL) (2.5 MG/3ML) 0.083% nebulizer solution Take 3 mLs (2.5 mg total) by nebulization every 6 (six) hours as needed for wheezing or shortness of breath. Qty: 75 mL, Refills: 4   Associated Diagnoses: COPD exacerbation (HCC)    Albuterol Sulfate (PROAIR RESPICLICK) 123XX123 (90 BASE) MCG/ACT AEPB Inhale 2 puffs into the lungs every 6 (six) hours as needed.  Qty: 1 each, Refills: 2    carvedilol (COREG) 12.5 MG tablet TAKE ONE TABLET BY MOUTH TWICE DAILY WITH MEALS Qty: 60 tablet, Refills: 4    cholecalciferol (VITAMIN D) 1000 UNITS tablet Take 1,000 Units by mouth daily.    ELIQUIS 5 MG TABS tablet TAKE 1 TABLET BY MOUTH TWICE A DAY Qty: 60 tablet, Refills: 5    finasteride (PROSCAR) 5 MG tablet Take 5 mg by mouth daily.     Fluticasone-Salmeterol (ADVAIR DISKUS) 250-50 MCG/DOSE AEPB Inhale 1 puff into the lungs 2 (two) times daily. Qty: 60 each, Refills: 11    furosemide (LASIX) 40 MG tablet TAKE ONE TABLET BY MOUTH ONCE DAILY Qty: 90 tablet, Refills: 3    levothyroxine (SYNTHROID, LEVOTHROID) 112 MCG tablet Take 112 mcg by mouth  daily before breakfast.     loratadine (CLARITIN) 10 MG tablet Take 10 mg by mouth daily.    losartan (COZAAR) 100 MG tablet Take 50 mg by mouth 2 (two) times daily.    Melatonin 5 MG CAPS Take 5 mg by mouth at bedtime.     Multiple Vitamin (MULTIVITAMIN WITH MINERALS) TABS tablet Take 1 tablet by mouth daily.    nitroGLYCERIN (NITROSTAT) 0.4 MG SL tablet Place 0.4 mg under the tongue every 5 (five) minutes as needed for chest pain.     OXYGEN Inhale 2 L into the lungs See admin instructions. Use oxygen whenever resting    pantoprazole (PROTONIX) 40 MG tablet Take 1 tablet (40 mg total) by mouth daily. Qty: 31 tablet, Refills: 11    POTASSIUM PO Take 1 tablet by mouth at bedtime.    Psyllium (METAMUCIL PO) Take 15 mLs by mouth daily. Mix in liquid and drink    Tamsulosin HCl (FLOMAX) 0.4 MG CAPS Take 0.4 mg by mouth at bedtime.     promethazine (PHENERGAN) 25 MG tablet Take 1 tablet (25 mg total) by mouth every 6 (six) hours as needed for nausea or vomiting. Qty: 30 tablet, Refills: 3      STOP taking these medications     psyllium (METAMUCIL) 58.6 % powder         Disposition: Pt is being discharged home today in good condition. Discharge Instructions    Diet - low sodium heart healthy    Complete by:  As directed    Increase activity slowly    Complete by:  As directed      Follow-up Information    Baldwin Jamaica, PA-C Follow up on 11/19/2016.   Specialty:  Cardiology Why:  at [facility] information: [place] [address] [phone]           Duration of Discharge Encounter: Greater than 30 minutes including physician time.  Signed, Chanetta Marshall, NP 11/04/2016 8:59 AM

## 2016-11-05 ENCOUNTER — Encounter: Payer: Self-pay | Admitting: Physician Assistant

## 2016-11-08 ENCOUNTER — Emergency Department (HOSPITAL_COMMUNITY): Payer: Managed Care, Other (non HMO)

## 2016-11-08 ENCOUNTER — Other Ambulatory Visit: Payer: Self-pay

## 2016-11-08 ENCOUNTER — Emergency Department (HOSPITAL_COMMUNITY)
Admission: EM | Admit: 2016-11-08 | Discharge: 2016-11-08 | Disposition: A | Discharge status: Home / Self Care | Payer: Managed Care, Other (non HMO) | Attending: Emergency Medicine | Admitting: Emergency Medicine

## 2016-11-08 ENCOUNTER — Encounter (HOSPITAL_COMMUNITY): Payer: Self-pay | Admitting: Emergency Medicine

## 2016-11-08 DIAGNOSIS — Z91040 Latex allergy status: Secondary | ICD-10-CM | POA: Diagnosis not present

## 2016-11-08 DIAGNOSIS — R05 Cough: Secondary | ICD-10-CM | POA: Diagnosis not present

## 2016-11-08 DIAGNOSIS — I110 Hypertensive heart disease with heart failure: Secondary | ICD-10-CM | POA: Insufficient documentation

## 2016-11-08 DIAGNOSIS — Z7901 Long term (current) use of anticoagulants: Secondary | ICD-10-CM | POA: Insufficient documentation

## 2016-11-08 DIAGNOSIS — R0602 Shortness of breath: Secondary | ICD-10-CM | POA: Diagnosis not present

## 2016-11-08 DIAGNOSIS — I5022 Chronic systolic (congestive) heart failure: Secondary | ICD-10-CM | POA: Insufficient documentation

## 2016-11-08 DIAGNOSIS — I252 Old myocardial infarction: Secondary | ICD-10-CM | POA: Diagnosis not present

## 2016-11-08 DIAGNOSIS — R002 Palpitations: Secondary | ICD-10-CM | POA: Diagnosis present

## 2016-11-08 DIAGNOSIS — Z95810 Presence of automatic (implantable) cardiac defibrillator: Secondary | ICD-10-CM | POA: Diagnosis not present

## 2016-11-08 DIAGNOSIS — Z79899 Other long term (current) drug therapy: Secondary | ICD-10-CM | POA: Diagnosis not present

## 2016-11-08 DIAGNOSIS — J441 Chronic obstructive pulmonary disease with (acute) exacerbation: Secondary | ICD-10-CM

## 2016-11-08 LAB — CBC
HEMATOCRIT: 48 % (ref 39.0–52.0)
HEMOGLOBIN: 15.7 g/dL (ref 13.0–17.0)
MCH: 31.3 pg (ref 26.0–34.0)
MCHC: 32.7 g/dL (ref 30.0–36.0)
MCV: 95.8 fL (ref 78.0–100.0)
Platelets: 213 10*3/uL (ref 150–400)
RBC: 5.01 MIL/uL (ref 4.22–5.81)
RDW: 15.1 % (ref 11.5–15.5)
WBC: 10.9 10*3/uL — ABNORMAL HIGH (ref 4.0–10.5)

## 2016-11-08 LAB — I-STAT TROPONIN, ED: Troponin i, poc: 0.02 ng/mL (ref 0.00–0.08)

## 2016-11-08 LAB — BASIC METABOLIC PANEL
ANION GAP: 9 (ref 5–15)
BUN: 18 mg/dL (ref 6–20)
CHLORIDE: 103 mmol/L (ref 101–111)
CO2: 28 mmol/L (ref 22–32)
Calcium: 9 mg/dL (ref 8.9–10.3)
Creatinine, Ser: 1.68 mg/dL — ABNORMAL HIGH (ref 0.61–1.24)
GFR calc non Af Amer: 38 mL/min — ABNORMAL LOW (ref >60)
GFR, EST AFRICAN AMERICAN: 44 mL/min — AB (ref >60)
Glucose, Bld: 109 mg/dL — ABNORMAL HIGH (ref 65–99)
Potassium: 3.9 mmol/L (ref 3.5–5.1)
Sodium: 140 mmol/L (ref 135–145)

## 2016-11-08 MED ORDER — PREDNISONE 10 MG PO TABS: 20.0000 mg | ORAL_TABLET | Freq: Every day | ORAL | 0 refills | Status: DC

## 2016-11-08 MED ORDER — PREDNISONE 20 MG PO TABS
40.0000 mg | ORAL_TABLET | Freq: Once | ORAL | Status: AC
  Administered 2016-11-08: 40 mg via ORAL
  Filled 2016-11-08: qty 2

## 2016-11-08 NOTE — ED Notes (Signed)
EDP at bedside  

## 2016-11-08 NOTE — Discharge Instructions (Signed)
Please read and follow all provided instructions.  Your diagnoses today include:  1. COPD exacerbation ([place])     Tests performed today include: An EKG of your heart A chest x-ray Cardiac enzymes - a blood test for heart muscle damage Blood counts and electrolytes Vital signs. See below for your results today.   Medications prescribed:   Take any prescribed medications only as directed.  Follow-up instructions: Please follow-up with your primary care provider as soon as you can for further evaluation of your symptoms.   Return instructions:  SEEK IMMEDIATE MEDICAL ATTENTION IF: You have severe chest pain, especially if the pain is crushing or pressure-like and spreads to the arms, back, neck, or jaw, or if you have sweating, nausea (feeling sick to your stomach), or shortness of breath. THIS IS AN EMERGENCY. Don't wait to see if the pain will go away. Get medical help at once. Call 911 or 0 (operator). DO NOT drive yourself to the hospital.  Your chest pain gets worse and does not go away with rest.  You have an attack of chest pain lasting longer than usual, despite rest and treatment with the medications your caregiver has prescribed.  You wake from sleep with chest pain or shortness of breath. You feel dizzy or faint. You have chest pain not typical of your usual pain for which you originally saw your caregiver.  You have any other emergent concerns regarding your health.  Additional Information: Chest pain comes from many different causes. Your caregiver has diagnosed you as having chest pain that is not specific for one problem, but does not require admission.  You are at low risk for an acute heart condition or other serious illness.   Your vital signs today were: BP 112/74    Pulse 65    Temp 97.4 F (36.3 C) (Oral)    Resp 15    Ht 6\' 1"  (1.854 m)    Wt 92.2 kg    SpO2 93%    BMI 26.82 kg/m  If your blood pressure (BP) was elevated above 135/85 this visit, please have this  repeated by your doctor within one month. --------------

## 2016-11-08 NOTE — ED Triage Notes (Signed)
Pt sts palpitations starting this am; pt sts hx of afib; pt feels pressure in chest

## 2016-11-08 NOTE — ED Provider Notes (Signed)
[facility] DEPT Provider Note   CSN: [id number] Arrival date & time: 11/08/16  1515     History   Chief Complaint Chief Complaint  Patient presents with  . Palpitations    HPI Corey Tyler is a 75 y.o. male.  HPI  75 y.o. male with a hx of Silent MI with Ischemic Myopathy, Paroxysmal Afib, CHF, COPD, HTN, HLD, ICD in Place, Ventricular Tachycardia, presents to the Emergency Department today complaining of palpitations this AM. Pt states that he was sitting on the couch when he felt his heart rate increase and decrease. No active chest pain. No fevers. No diaphoresis. No N/V. States symptoms resolved later in the day. Pt family concerned due to previous visit to ED. Pt was admitted on 11-02-16 due to Ventricular Tachycardia and started on IV Amiodarone with conversion to NSR. DCed on 11-04-16 in stable condition. Noted increase in home Amiodarone. Follow up appointment on 11-19-16. Pt asymptomatic currently. No other symptoms noted.   Cardiologist- Dr. Percival Spanish Electrophysiologist Dr. [name]    DATE TEST    2008 echo EF40-45%   1/14 echo EF 15%   8/17 echo EF 25%     Past Medical History:  Diagnosis Date  . A-fib ([place])    a. Noted on 12/2012 interrogation, placed on Apixaban and ultimately stopped NOACs 2/2 personal decision 8/14.  . Arthritis   . Atrial tachycardia-non sustained    a. Noted on 03/2012 interrogation.  . Cardiac defibrillator -dual  [employer] [facility]   . Chronic systolic heart failure (HCC)    EF down to 20 to 25% per echo November 2012  . COPD (chronic obstructive pulmonary disease) ([place])   . Hyperlipidemia   . Hypertension   . ICD (implantable cardiac defibrillator) in place   . NICM (nonischemic cardiomyopathy) ([place])    a. Normal cors 2000. b. Minimal plaque 2013.  . Old myocardial infarction    a. ?Silent MI. b. Large fixed inferolateral defect c/w with prior infarct, no ischemia. c. Cath in 2000/2013 with only minimal CAD.  Marland Kitchen Pulmonary  nodule, right    Last scan in 2010 showing stability; felt to be benign.  Marland Kitchen PVC's (premature ventricular contractions)   . Ventricular tachycardia, polymorphic ([place])    Rx via ICD 12/14    Patient Active Problem List   Diagnosis Date Noted  . VT (ventricular tachycardia) ([place]) 11/02/2016  . Benign fibroma of prostate 04/02/2015  . Ventricular tachycardia, polymorphic ([place])   . Diverticulosis of colon without hemorrhage 09/25/2013  . Atrial fibrillation ([place]) 12/29/2012  . Cardiac defibrillator -dual  [facility]   . Hyperlipidemia   . PVCs 11/26/2011  . Chronic systolic heart failure ([place]) 10/07/2011  . Cardiomyopathy, nonischemic and ischemic 09/24/2011  . HYPERTENSION 12/18/2009  . 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 PULMONARY NODULE 12/04/2008    Past Surgical History:  Procedure Laterality Date  . APPENDECTOMY    . CARDIAC CATHETERIZATION  2000  . CARDIAC DEFIBRILLATOR PLACEMENT    . CARDIOVERSION  03/30/2013   Procedure: CARDIOVERSION;  Surgeon: Thayer Headings, MD;  Location: [facility];  Service: Cardiovascular;;  . COLON SURGERY    . COLONOSCOPY N/A 09/25/2013   Procedure: COLONOSCOPY;  Surgeon: Jerene Bears, MD;  [facility];  Service: Endoscopy;  Laterality: N/A;  . ICD  12/2011   [name]  . IMPLANTABLE CARDIOVERTER DEFIBRILLATOR IMPLANT N/A 01/06/2012   Procedure: IMPLANTABLE CARDIOVERTER DEFIBRILLATOR IMPLANT;  Surgeon: Deboraha Sprang, MD;  Location: [facility] CATH LAB;  Service: Cardiovascular;  Laterality: N/A;  . TEE WITHOUT CARDIOVERSION N/A 03/30/2013   Procedure: TRANSESOPHAGEAL ECHOCARDIOGRAM (TEE);  Surgeon: Thayer Headings, MD;  Location: [facility];  Service: Cardiovascular;  Laterality: N/A;       Home Medications    Prior to Admission medications   Medication Sig Start Date End Date Taking? Authorizing Provider  albuterol (PROVENTIL) (2.5 MG/3ML) 0.083% nebulizer solution Take 3 mLs (2.5 mg total) by  nebulization every 6 (six) hours as needed for wheezing or shortness of breath. 01/14/15   Elsie Stain, MD  Albuterol Sulfate (PROAIR RESPICLICK) 123XX123 (90 BASE) MCG/ACT AEPB Inhale 2 puffs into the lungs every 6 (six) hours as needed. Patient taking differently: Inhale 2 puffs into the lungs every 6 (six) hours as needed (shortness of breath/ wheezing).  09/17/15   Tammy S Parrett, NP  amiodarone (PACERONE) 400 MG tablet Take 1 tablet (400 mg total) by mouth 2 (two) times daily. 11/04/16   Amber Sena Slate, NP  carvedilol (COREG) 12.5 MG tablet TAKE ONE TABLET BY MOUTH TWICE DAILY WITH MEALS 10/06/16   Mihai Croitoru, MD  cholecalciferol (VITAMIN D) 1000 UNITS tablet Take 1,000 Units by mouth daily.    Historical Provider, MD  ELIQUIS 5 MG TABS tablet TAKE 1 TABLET BY MOUTH TWICE A DAY 07/30/15   Minus Breeding, MD  finasteride (PROSCAR) 5 MG tablet Take 5 mg by mouth daily.     Historical Provider, MD  Fluticasone-Salmeterol (ADVAIR DISKUS) 250-50 MCG/DOSE AEPB Inhale 1 puff into the lungs 2 (two) times daily. Patient taking differently: Inhale 1 puff into the lungs daily.  07/08/15   Elsie Stain, MD  furosemide (LASIX) 40 MG tablet TAKE ONE TABLET BY MOUTH ONCE DAILY 09/23/16   Minus Breeding, MD  levothyroxine (SYNTHROID, LEVOTHROID) 112 MCG tablet Take 112 mcg by mouth daily before breakfast.     Historical Provider, MD  loratadine (CLARITIN) 10 MG tablet Take 10 mg by mouth daily.    Historical Provider, MD  losartan (COZAAR) 100 MG tablet Take 50 mg by mouth 2 (two) times daily.    Historical Provider, MD  Melatonin 5 MG CAPS Take 5 mg by mouth at bedtime.     Historical Provider, MD  Multiple Vitamin (MULTIVITAMIN WITH MINERALS) TABS tablet Take 1 tablet by mouth daily.    Historical Provider, MD  nitroGLYCERIN (NITROSTAT) 0.4 MG SL tablet Place 0.4 mg under the tongue every 5 (five) minutes as needed for chest pain.  11/12/11   Burtis Junes, NP  OXYGEN Inhale 2 L into the lungs See  admin instructions. Use oxygen whenever resting    Historical Provider, MD  pantoprazole (PROTONIX) 40 MG tablet Take 1 tablet (40 mg total) by mouth daily. 07/21/16   Amy S Esterwood, PA-C  POTASSIUM PO Take 1 tablet by mouth at bedtime.    Historical Provider, MD  promethazine (PHENERGAN) 25 MG tablet Take 1 tablet (25 mg total) by mouth every 6 (six) hours as needed for nausea or vomiting. Patient not taking: Reported on 11/02/2016 07/21/16   Amy S Esterwood, PA-C  Psyllium (METAMUCIL PO) Take 15 mLs by mouth daily. Mix in liquid and drink    Historical Provider, MD  Tamsulosin HCl (FLOMAX) 0.4 MG CAPS Take 0.4 mg by mouth at bedtime.     Historical Provider, MD    Family History Family History  Problem Relation Age of Onset  . Emphysema Mother   . Heart disease Mother  AVR  . Heart failure Father     Died agwe 51    Social History Social History  Substance Use Topics  . Smoking status: Former Smoker    Packs/day: 2.50    Years: 40.00    Types: Cigarettes    Quit date: 11/24/1983  . Smokeless tobacco: Never Used     Comment: 2 1/2 ppd x 40 years  . Alcohol use No     Allergies   Xarelto [rivaroxaban]; Adhesive [tape]; Atorvastatin; Codeine; Isosorbide nitrate; Latex; Levofloxacin; Lisinopril; Lorazepam; Sertraline; and Tiotropium bromide monohydrate   Review of Systems Review of Systems ROS reviewed and all are negative for acute change except as noted in the HPI.  Physical Exam Updated Vital Signs BP 122/71   Pulse 65   Temp 97.4 F (36.3 C) (Oral)   Resp 17   Ht 6\' 1"  (1.854 m)   Wt 92.2 kg   SpO2 91%   BMI 26.82 kg/m   Physical Exam  Constitutional: He is oriented to person, place, and time. Vital signs are normal. He appears well-developed and well-nourished.  HENT:  Head: Normocephalic and atraumatic.  Right Ear: Hearing normal.  Left Ear: Hearing normal.  Eyes: Conjunctivae and EOM are normal. Pupils are equal, round, and reactive to light.  Neck:  Normal range of motion. Neck supple.  Cardiovascular: Normal rate, regular rhythm, normal heart sounds and intact distal pulses.   Pulmonary/Chest: Effort normal and breath sounds normal.  Abdominal: There is no tenderness.  Musculoskeletal: Normal range of motion.  Neurological: He is alert and oriented to person, place, and time.  Skin: Skin is warm and dry.  Psychiatric: He has a normal mood and affect. His speech is normal and behavior is normal. Thought content normal.  Nursing note and vitals reviewed.  ED Treatments / Results  Labs (all labs ordered are listed, but only abnormal results are displayed) Labs Reviewed  BASIC METABOLIC PANEL - Abnormal; Notable for the following:       Result Value   Glucose, Bld 109 (*)    Creatinine, Ser 1.68 (*)    GFR calc non Af Amer 38 (*)    GFR calc Af Amer 44 (*)    All other components within normal limits  CBC - Abnormal; Notable for the following:    WBC 10.9 (*)    All other components within normal limits  I-STAT TROPOININ, ED    EKG  EKG Interpretation None       Radiology Dg Chest 2 View  Result Date: 11/08/2016 CLINICAL DATA:  Shortness of breath, dry cough for 2 weeks EXAM: CHEST  2 VIEW COMPARISON:  11/02/2016 FINDINGS: There is hyperinflation of the lungs compatible with COPD. Left AICD remains in place, unchanged. Scarring in lung bases. No confluent airspace opacities or effusions. No acute bony abnormality. IMPRESSION: COPD/chronic changes.  No active disease. Electronically Signed   By: Rolm Baptise M.D.   On: 11/08/2016 15:56    Procedures Procedures (including critical care time)  Medications Ordered in ED Medications - No data to display   Initial Impression / Assessment and Plan / ED Course  I have reviewed the triage vital signs and the nursing notes.  Pertinent labs & imaging results that were available during my care of the patient were reviewed by me and considered in my medical decision making  (see chart for details).  Clinical Course    Final Clinical Impressions(s) / ED Diagnoses  {I have reviewed and evaluated the  relevant laboratory values. {I have reviewed and evaluated the relevant imaging studies. {I have interpreted the relevant EKG. {I have reviewed the relevant previous healthcare records.  {I obtained HPI from historian. {Patient discussed with supervising physician.  ED Course:  Assessment: Pt is a 75yM with hx Silent MI with Ischemic Myopathy, Paroxysmal Afib, CHF, COPD, HTN, HLD, ICD in Place, Ventricular Tachycardia who presents with palpitations this AM. Resolved since being in ED. No active CP/SOB. Recent ED visit for VTach. Resolve with Amiodarone. Pt is anticoagulated on Eliquis. On exam, pt in NAD. Nontoxic/nonseptic appearing. VSS. Afebrile. Lungs CTA. Heart RRR. Abdomen nontender soft. CBC with mild leukocytosis. BMP unremarkable. Trop negative. EKG unremarkable. CXR with COPD changes. Given prednisone in ED. Will treat as COPD exacerbation. Seen by supervising physician. Plan is to DC home with follow up to PCP. At time of discharge, Patient is in no acute distress. Vital Signs are stable. Patient is able to ambulate. Patient able to tolerate PO.   Disposition/Plan:  DC Home Additional Verbal discharge instructions given and discussed with patient.  Pt Instructed to f/u with PCP in the next week for evaluation and treatment of symptoms. Return precautions given Pt acknowledges and agrees with plan  Supervising Physician Nat Christen, MD  Final diagnoses:  COPD exacerbation [facility])    New Prescriptions New Prescriptions   No medications on file     Shary Decamp, PA-C 11/08/16 [place], MD 11/13/16 [phone]

## 2016-11-08 NOTE — ED Notes (Signed)
Pt.  Noted to be 91% on room air. Pt. Wears oxygen at home as needed for COPD. Pt. Placed on 2L oxygen via nasal cannula.

## 2016-11-09 DIAGNOSIS — J449 Chronic obstructive pulmonary disease, unspecified: Secondary | ICD-10-CM | POA: Diagnosis not present

## 2016-11-09 DIAGNOSIS — I10 Essential (primary) hypertension: Secondary | ICD-10-CM | POA: Diagnosis not present

## 2016-11-09 DIAGNOSIS — I2510 Atherosclerotic heart disease of native coronary artery without angina pectoris: Secondary | ICD-10-CM | POA: Diagnosis not present

## 2016-11-09 DIAGNOSIS — I472 Ventricular tachycardia: Secondary | ICD-10-CM | POA: Diagnosis not present

## 2016-11-17 NOTE — Progress Notes (Signed)
Cardiology Office Note Date:  11/19/2016  Patient ID:  Nikita, Kope February 25, 1941, MRN [id number] PCP:  [name], MD  Cardiologist:  Dr. Percival Spanish Electrophysiologist: Dr. [name]    Chief Complaint:  hospital f/u  History of Present Illness: ESTIN LAMSON is a 75 y.o. male with history of Paroxysmal AFib, NICM w/ICD, chronic CHF (systolic), VT (polymorphic in 2014), most recently admitted to [facility] 11/02/16 with c/o weakness noted in monomorphic VT below his detection rate, his device reprogrammed and his home amiodarone re-bolused, discharged 11/04/16 on 400mg  BID with plans to schedule EST to make sure his sinus rate does not get above his detection rate.  He was seen in the ED 11/08/16 c/o palpitations, appears he was treated for COPD exacerbation with a dose of steroids and discharged home.  He has been feeling well since, states he felt like he ha some congestion and cleared with the steroids, these he uses only PRN not routinely.  He has not had any CP, palpitations, no near syncope or syncope, he will get fleetingly lightheaded when standing from bending down.   He has not been shocked.  He denies any bleeding or signs of bleeding   Device information: SJM dual chamber ICD, implanted 01/06/12, Dr. [name], primary prevention + hx ploymorphic VT with therapy 2014 Monomorphic VT below detection rate Dec 2017 AAD amiodarone Elevated TSH hx treated with synthroid The patient is aware of device alert, industry representative d/w the patient again today he was aware and has been already demonstrated the vibratory alert  AFib Hx GI bleed on Xarelto    Past Medical History:  Diagnosis Date  . A-fib ([place])    a. Noted on 12/2012 interrogation, placed on Apixaban and ultimately stopped NOACs 2/2 personal decision 8/14.  . Arthritis   . Atrial tachycardia-non sustained    a. Noted on 03/2012 interrogation.  . Cardiac defibrillator -dual  [facility]   . Chronic systolic heart failure  (HCC)    EF down to 20 to 25% per echo November 2012  . COPD (chronic obstructive pulmonary disease) ([place])   . Hyperlipidemia   . Hypertension   . ICD (implantable cardiac defibrillator) in place   . NICM (nonischemic cardiomyopathy) ([place])    a. Normal cors 2000. b. Minimal plaque 2013.  . Old myocardial infarction    a. ?Silent MI. b. Large fixed inferolateral defect c/w with prior infarct, no ischemia. c. Cath in 2000/2013 with only minimal CAD.  Marland Kitchen Pulmonary nodule, right    Last scan in 2010 showing stability; felt to be benign.  Marland Kitchen PVC's (premature ventricular contractions)   . Ventricular tachycardia, polymorphic ([place])    Rx via ICD 12/14    Past Surgical History:  Procedure Laterality Date  . APPENDECTOMY    . CARDIAC CATHETERIZATION  2000  . CARDIAC DEFIBRILLATOR PLACEMENT    . CARDIOVERSION  03/30/2013   Procedure: CARDIOVERSION;  Surgeon: Thayer Headings, MD;  Location: [facility];  Service: Cardiovascular;;  . COLON SURGERY    . COLONOSCOPY N/A 09/25/2013   Procedure: COLONOSCOPY;  Surgeon: Jerene Bears, MD;  [facility];  Service: Endoscopy;  Laterality: N/A;  . ICD  12/2011   [name]  . IMPLANTABLE CARDIOVERTER DEFIBRILLATOR IMPLANT N/A 01/06/2012   Procedure: IMPLANTABLE CARDIOVERTER DEFIBRILLATOR IMPLANT;  Surgeon: Deboraha Sprang, MD;  [facility] LAB;  Service: Cardiovascular;  Laterality: N/A;  . TEE WITHOUT CARDIOVERSION N/A 03/30/2013   Procedure: TRANSESOPHAGEAL ECHOCARDIOGRAM (TEE);  Surgeon: Arnette Norris  Deboraha Sprang, MD;  Location: [facility] ENDOSCOPY;  Service: Cardiovascular;  Laterality: N/A;    Current Outpatient Prescriptions  Medication Sig Dispense Refill  . albuterol (PROVENTIL) (2.5 MG/3ML) 0.083% nebulizer solution Take 3 mLs (2.5 mg total) by nebulization every 6 (six) hours as needed for wheezing or shortness of breath. 75 mL 4  . Albuterol Sulfate (PROAIR RESPICLICK) 123XX123 (90 BASE) MCG/ACT AEPB Inhale 2 puffs into the lungs every 6 (six) hours as  needed. (Patient taking differently: Inhale 2 puffs into the lungs every 6 (six) hours as needed (shortness of breath/ wheezing). ) 1 each 2  . amiodarone (PACERONE) 400 MG tablet Take 1 tablet (400 mg total) by mouth 2 (two) times daily. 60 tablet 1  . carvedilol (COREG) 12.5 MG tablet TAKE ONE TABLET BY MOUTH TWICE DAILY WITH MEALS 60 tablet 4  . cholecalciferol (VITAMIN D) 1000 UNITS tablet Take 1,000 Units by mouth daily.    Marland Kitchen ELIQUIS 5 MG TABS tablet TAKE 1 TABLET BY MOUTH TWICE A DAY 60 tablet 5  . finasteride (PROSCAR) 5 MG tablet Take 5 mg by mouth daily.     . Fluticasone-Salmeterol (ADVAIR DISKUS) 250-50 MCG/DOSE AEPB Inhale 1 puff into the lungs 2 (two) times daily. (Patient taking differently: Inhale 1 puff into the lungs daily. ) 60 each 11  . furosemide (LASIX) 40 MG tablet TAKE ONE TABLET BY MOUTH ONCE DAILY 90 tablet 3  . levothyroxine (SYNTHROID, LEVOTHROID) 112 MCG tablet Take 112 mcg by mouth daily before breakfast.     . loratadine (CLARITIN) 10 MG tablet Take 10 mg by mouth daily.    Marland Kitchen losartan (COZAAR) 100 MG tablet Take 50 mg by mouth 2 (two) times daily.    . Melatonin 5 MG CAPS Take 5 mg by mouth at bedtime.     . Multiple Vitamin (MULTIVITAMIN WITH MINERALS) TABS tablet Take 1 tablet by mouth daily.    . nitroGLYCERIN (NITROSTAT) 0.4 MG SL tablet Place 0.4 mg under the tongue every 5 (five) minutes as needed for chest pain.     . OXYGEN Inhale 2 L into the lungs See admin instructions. Use oxygen whenever resting    . pantoprazole (PROTONIX) 40 MG tablet Take 1 tablet (40 mg total) by mouth daily. 31 tablet 11  . POTASSIUM PO Take 1 tablet by mouth at bedtime.    . predniSONE (DELTASONE) 10 MG tablet Take 2 tablets (20 mg total) by mouth daily with breakfast. 5 tablet 0  . Psyllium (METAMUCIL PO) Take 15 mLs by mouth daily. Mix in liquid and drink    . Tamsulosin HCl (FLOMAX) 0.4 MG CAPS Take 0.4 mg by mouth at bedtime.      No current facility-administered medications  for this visit.     Allergies:   Xarelto [rivaroxaban]; Adhesive [tape]; Atorvastatin; Codeine; Isosorbide nitrate; Latex; Levofloxacin; Lisinopril; Lorazepam; Sertraline; and Tiotropium bromide monohydrate   Social History:  The patient  reports that he quit smoking about 33 years ago. His smoking use included Cigarettes. He has a 100.00 pack-year smoking history. He has never used smokeless tobacco. He reports that he does not drink alcohol or use drugs.   Family History:  The patient's family history includes Emphysema in his mother; Heart disease in his mother; Heart failure in his father.  ROS:  Please see the history of present illness.  All other systems are reviewed and otherwise negative.   PHYSICAL EXAM:  VS:  BP 124/70   Pulse 66   Ht  6\' 1"  (1.854 m)   Wt 211 lb (95.7 kg)   BMI 27.84 kg/m  BMI: Body mass index is 27.84 kg/m. Well nourished, well developed, in no acute distress  HEENT: normocephalic, atraumatic  Neck: no JVD, carotid bruits or masses Cardiac: RRR; no significant murmurs, no rubs, or gallops Lungs:  clear to auscultation bilaterally, no wheezing, rhonchi or rales  Abd: soft, nontender MS: no deformity or atrophy Ext:  trace edema  Skin: warm and dry, no rash Neuro:  No gross deficits appreciated Psych: euthymic mood, full affect  ICD site is stable, no tethering or discomfort   EKG:  Done 11/08/16 AFib, 92bpm, QRS 131ms, QTc 479 ICD interrogation done today and reviewed by myself: stable device and battery status, no VT, AF burden 1.6%, V pacing 1%  07/22/16: TTE Study Conclusions - Left ventricle: The cavity size was mildly dilated. There was   mild focal basal hypertrophy of the septum. Systolic function was   severely reduced. The estimated ejection fraction was in the   range of 25% to 30%. Diffuse hypokinesis. Hypokinesis is worse in   the inferolateral and anteriolateral myocardium. Doppler   parameters are consistent with abnormal left  ventricular   relaxation (grade 1 diastolic dysfunction). Doppler parameters   are consistent with indeterminate ventricular filling pressure. - Aortic valve: Transvalvular velocity was within the normal range.   There was no stenosis. There was mild regurgitation. - Mitral valve: Transvalvular velocity was within the normal range.   There was no evidence for stenosis. There was mild regurgitation. - Left atrium: The atrium was moderately dilated. 3mm - Right ventricle: The cavity size was mildly dilated. Wall   thickness was normal. Systolic function was mildly reduced. - Tricuspid valve: There was mild regurgitation. - Pulmonic valve: Transvalvular velocity was within the normal   range. There was no evidence for stenosis. There was mild   regurgitation. - Pulmonary arteries: Systolic pressure was mildly increased. PA   peak pressure: 41 mm Hg (S). - Pericardium, extracardiac: A small pericardial effusion was   identified.  Recent Labs: 07/06/2016: B Natriuretic Peptide 188.0 11/02/2016: Magnesium 2.3 11/08/2016: BUN 18; Creatinine, Ser 1.68; Hemoglobin 15.7; Platelets 213; Potassium 3.9; Sodium 140  No results found for requested labs within last 8760 hours.   Estimated Creatinine Clearance: 42.3 mL/min (by C-G formula based on SCr of 1.68 mg/dL (H)).   Wt Readings from Last 3 Encounters:  11/19/16 211 lb (95.7 kg)  11/08/16 203 lb 5 oz (92.2 kg)  11/04/16 192 lb 4.8 oz (87.2 kg)     Other studies reviewed: Additional studies/records reviewed today include: summarized above  ASSESSMENT AND PLAN:  1. VT     none further, will taper amio back to chronic dose     plan ETT to evaluate his HR with exertion to ensure his Sinus rate does not get into his VT zone     amio labs today     He sees his eye doctor regularly as well as his pulmonologist who he says is aware of his amiodarone  2. ICM w/ICD     corVue appears to have some fluid accumulation though his exam is negative  and is without symptoms of fluid OL     He weighs daily and has not observed any rapid weight gain, is good about monitoring sodium intake.     On BB/ARB     Normal device function, no changes made     He had VTach in Dec  2014  requiring cardioversion from his device. He had a followup perfusion study that demonstrated a previous inferior large fixed defect but no ischemia. Dr. Percival Spanish noted he had only minimal plaque at the previous catheterization     C/w Dr. Percival Spanish  3. AFib     Paroxysmal, AF burden 1.6%     CHA2DS2Vasc is 5, on Eliquis  4. HTN     stable  Disposition: F/u with a remote transmission in 1 month to evaluate for any VT, office visit inn 3 months, sooner if needed.  Current medicines are reviewed at length with the patient today.  The patient did not have any concerns regarding medicines.  Haywood Lasso, PA-C 11/19/2016 11:44 AM     [address] [place] [address] [phone] (office)  [phone] (fax)

## 2016-11-19 ENCOUNTER — Ambulatory Visit (INDEPENDENT_AMBULATORY_CARE_PROVIDER_SITE_OTHER): Payer: Managed Care, Other (non HMO) | Admitting: Physician Assistant

## 2016-11-19 ENCOUNTER — Encounter: Payer: Self-pay | Admitting: Physician Assistant

## 2016-11-19 DIAGNOSIS — I255 Ischemic cardiomyopathy: Secondary | ICD-10-CM

## 2016-11-19 DIAGNOSIS — I480 Paroxysmal atrial fibrillation: Secondary | ICD-10-CM

## 2016-11-19 DIAGNOSIS — I10 Essential (primary) hypertension: Secondary | ICD-10-CM

## 2016-11-19 DIAGNOSIS — I472 Ventricular tachycardia, unspecified: Secondary | ICD-10-CM

## 2016-11-19 DIAGNOSIS — Z5181 Encounter for therapeutic drug level monitoring: Secondary | ICD-10-CM | POA: Diagnosis not present

## 2016-11-19 DIAGNOSIS — I2589 Other forms of chronic ischemic heart disease: Secondary | ICD-10-CM

## 2016-11-19 LAB — HEPATIC FUNCTION PANEL
ALT: 18 U/L (ref 9–46)
AST: 16 U/L (ref 10–35)
Albumin: 3.5 g/dL — ABNORMAL LOW (ref 3.6–5.1)
Alkaline Phosphatase: 57 U/L (ref 40–115)
Bilirubin, Direct: 0.2 mg/dL (ref <=0.2)
Indirect Bilirubin: 0.7 mg/dL (ref 0.2–1.2)
TOTAL PROTEIN: 5.7 g/dL — AB (ref 6.1–8.1)
Total Bilirubin: 0.9 mg/dL (ref 0.2–1.2)

## 2016-11-19 MED ORDER — AMIODARONE HCL 200 MG PO TABS: 200.0000 mg | ORAL_TABLET | Freq: Every day | ORAL | 1 refills | Status: DC

## 2016-11-19 NOTE — Addendum Note (Signed)
Addended by: Claude Manges on: 11/19/2016 01:28 PM   Modules accepted: Orders

## 2016-11-19 NOTE — Patient Instructions (Addendum)
Medication Instructions:   FOR 2 WEEKS ONLY  TAKE AMIODARONE  200 MG TWICE A DAY   THEN RESUME TAKING AMIODARONE 200 MG  DAILY   If you need a refill on your cardiac medications before your next appointment, please call your pharmacy.  Labwork: TSH AND LFT    Testing/Procedures: Your physician has requested that you have an exercise tolerance test. For further information please visit [email or web address]. Please also follow instruction sheet, as given.     Follow-Up: IN 3 MONTHS WITH DR [name]   IN ONE MONTHS..ON December 21 2016 SEND IN A REMOTE TRANSMISSION    Any Other Special Instructions Will Be Listed Below (If Applicable).

## 2016-11-20 ENCOUNTER — Telehealth: Payer: Self-pay | Admitting: Physician Assistant

## 2016-11-20 LAB — TSH: TSH: 4.55 mIU/L — ABNORMAL HIGH (ref 0.40–4.50)

## 2016-11-20 NOTE — Telephone Encounter (Signed)
-----   Message from [facility] [place] sent at 11/20/2016  1:21 PM EST ----- Please let the patient know his labs look OK, plan repeat TSH in 6 weeks given historically has had elevated TSH.  Thanks [employer]

## 2016-11-20 NOTE — Telephone Encounter (Signed)
New Message  Pt voiced calling back to get results.  Please f/u

## 2016-11-20 NOTE — Telephone Encounter (Signed)
Informed patient of results and verbal understanding expressed.  Patient states Dr. Maudie Mercury monitors his thyroid and medication, so he declines 6 week recheck. He understands to have results forwarded to Cardiology for review when he gets labs drawn.

## 2016-11-25 DIAGNOSIS — H25043 Posterior subcapsular polar age-related cataract, bilateral: Secondary | ICD-10-CM | POA: Diagnosis not present

## 2016-11-25 DIAGNOSIS — H25013 Cortical age-related cataract, bilateral: Secondary | ICD-10-CM | POA: Diagnosis not present

## 2016-11-25 DIAGNOSIS — H2511 Age-related nuclear cataract, right eye: Secondary | ICD-10-CM | POA: Diagnosis not present

## 2016-11-25 DIAGNOSIS — H5703 Miosis: Secondary | ICD-10-CM | POA: Diagnosis not present

## 2016-11-25 DIAGNOSIS — H2513 Age-related nuclear cataract, bilateral: Secondary | ICD-10-CM | POA: Diagnosis not present

## 2016-12-03 ENCOUNTER — Encounter: Payer: Managed Care, Other (non HMO) | Admitting: Internal Medicine

## 2016-12-03 ENCOUNTER — Ambulatory Visit (INDEPENDENT_AMBULATORY_CARE_PROVIDER_SITE_OTHER): Payer: Managed Care, Other (non HMO)

## 2016-12-03 DIAGNOSIS — I5022 Chronic systolic (congestive) heart failure: Secondary | ICD-10-CM

## 2016-12-03 DIAGNOSIS — I428 Other cardiomyopathies: Secondary | ICD-10-CM

## 2016-12-03 DIAGNOSIS — Z95810 Presence of automatic (implantable) cardiac defibrillator: Secondary | ICD-10-CM

## 2016-12-03 DIAGNOSIS — I472 Ventricular tachycardia, unspecified: Secondary | ICD-10-CM

## 2016-12-03 DIAGNOSIS — Z79899 Other long term (current) drug therapy: Secondary | ICD-10-CM

## 2016-12-03 DIAGNOSIS — I480 Paroxysmal atrial fibrillation: Secondary | ICD-10-CM

## 2016-12-03 DIAGNOSIS — I255 Ischemic cardiomyopathy: Secondary | ICD-10-CM

## 2016-12-03 LAB — EXERCISE TOLERANCE TEST
CHL CUP MPHR: 144 {beats}/min
CHL CUP RESTING HR STRESS: 63 {beats}/min
CHL CUP STRESS STAGE 1 SBP: 154 mmHg
CHL CUP STRESS STAGE 1 SPEED: 0 mph
CHL CUP STRESS STAGE 2 GRADE: 0 %
CHL CUP STRESS STAGE 2 HR: 72 {beats}/min
CHL CUP STRESS STAGE 3 HR: 68 {beats}/min
CHL CUP STRESS STAGE 3 SPEED: 1 mph
CHL CUP STRESS STAGE 4 HR: 68 {beats}/min
CHL CUP STRESS STAGE 5 SPEED: 1.7 mph
CHL CUP STRESS STAGE 6 HR: 88 {beats}/min
CHL CUP STRESS STAGE 6 SPEED: 0 mph
CHL CUP STRESS STAGE 7 DBP: 77 mmHg
CHL CUP STRESS STAGE 7 SBP: 148 mmHg
CHL CUP STRESS STAGE 8 SPEED: 0 mph
CHL RATE OF PERCEIVED EXERTION: 15
CSEPED: 2 min
CSEPEDS: 21 s
CSEPEW: 2.5 METS
CSEPHR: 61 %
CSEPPHR: 88 {beats}/min
Percent of predicted max HR: 61 %
Stage 1 DBP: 75 mmHg
Stage 1 Grade: 0 %
Stage 1 HR: 72 {beats}/min
Stage 2 Speed: 0 mph
Stage 3 Grade: 0 %
Stage 4 Grade: 0 %
Stage 4 Speed: 1 mph
Stage 5 Grade: 10 %
Stage 5 HR: 86 {beats}/min
Stage 6 Grade: 6.4 %
Stage 7 Grade: 0 %
Stage 7 HR: 77 {beats}/min
Stage 7 Speed: 0 mph
Stage 8 DBP: 83 mmHg
Stage 8 Grade: 0 %
Stage 8 HR: 65 {beats}/min
Stage 8 SBP: 169 mmHg

## 2016-12-04 ENCOUNTER — Telehealth: Payer: Self-pay | Admitting: Internal Medicine

## 2016-12-04 NOTE — Telephone Encounter (Signed)
Called patient.  Advised of stress test result.  Notes Recorded by Baldwin Jamaica, PA-C on 12/04/2016 at 12:51 PM EST Please let the patient know his HR response during his exercise test was ok for his current defibrillator settings.  Patient expressed understanding

## 2016-12-04 NOTE — Telephone Encounter (Signed)
New Message   Pt did stress test, and just wanted to let nurse know how he was feeling. Would like a call back.

## 2016-12-07 ENCOUNTER — Ambulatory Visit (INDEPENDENT_AMBULATORY_CARE_PROVIDER_SITE_OTHER): Payer: Managed Care, Other (non HMO) | Admitting: *Deleted

## 2016-12-07 DIAGNOSIS — I255 Ischemic cardiomyopathy: Secondary | ICD-10-CM

## 2016-12-08 ENCOUNTER — Telehealth: Payer: Self-pay | Admitting: *Deleted

## 2016-12-08 NOTE — Telephone Encounter (Signed)
-----   Message from [facility], [place] sent at 12/04/2016 12:51 PM EST ----- Please let the patient know his HR response during his exercise test was ok for his current defibrillator settings.  Thanks [employer]

## 2016-12-08 NOTE — Telephone Encounter (Signed)
LMVOM TO CALL BACK FOR RESULTS 

## 2016-12-11 ENCOUNTER — Telehealth: Payer: Self-pay | Admitting: *Deleted

## 2016-12-11 LAB — CUP PACEART REMOTE DEVICE CHECK
Battery Remaining Percentage: 47 %
Brady Statistic AP VS Percent: 72 %
Brady Statistic RA Percent Paced: 72 %
Brady Statistic RV Percent Paced: 1 %
HighPow Impedance: 70 Ohm
HighPow Impedance: 70 Ohm
Implantable Lead Implant Date: 20130213
Implantable Lead Location: 753859
Lead Channel Impedance Value: 360 Ohm
Lead Channel Impedance Value: 410 Ohm
Lead Channel Pacing Threshold Amplitude: 1 V
Lead Channel Pacing Threshold Pulse Width: 0.6 ms
Lead Channel Sensing Intrinsic Amplitude: 2.8 mV
Lead Channel Setting Pacing Amplitude: 2.5 V
Lead Channel Setting Pacing Pulse Width: 0.6 ms
MDC IDC LEAD IMPLANT DT: 20130213
MDC IDC LEAD LOCATION: 753860
MDC IDC MSMT BATTERY REMAINING LONGEVITY: 44 mo
MDC IDC MSMT BATTERY VOLTAGE: 2.92 V
MDC IDC MSMT LEADCHNL RA PACING THRESHOLD AMPLITUDE: 0.625 V
MDC IDC MSMT LEADCHNL RA PACING THRESHOLD PULSEWIDTH: 0.5 ms
MDC IDC MSMT LEADCHNL RV SENSING INTR AMPL: 12 mV
MDC IDC PG IMPLANT DT: 20130213
MDC IDC PG SERIAL: 7003419
MDC IDC SESS DTM: 20180115144928
MDC IDC SET LEADCHNL RA PACING AMPLITUDE: 1.625
MDC IDC SET LEADCHNL RV SENSING SENSITIVITY: 0.5 mV
MDC IDC STAT BRADY AP VP PERCENT: 1 %
MDC IDC STAT BRADY AS VP PERCENT: 1 %
MDC IDC STAT BRADY AS VS PERCENT: 28 %

## 2016-12-11 NOTE — Telephone Encounter (Signed)
-----   Message from [facility], [place] sent at 12/04/2016 12:51 PM EST ----- Please let the patient know his HR response during his exercise test was ok for his current defibrillator settings.  Thanks [employer]

## 2016-12-11 NOTE — Progress Notes (Signed)
Remote ICD transmission.   

## 2016-12-11 NOTE — Telephone Encounter (Signed)
SPOKE TO PT ABOUT RESULTS AND VERALIZED UNDERSTANDING.Marland KitchenMarland Kitchen

## 2016-12-15 DIAGNOSIS — H25011 Cortical age-related cataract, right eye: Secondary | ICD-10-CM | POA: Diagnosis not present

## 2016-12-15 DIAGNOSIS — H2511 Age-related nuclear cataract, right eye: Secondary | ICD-10-CM | POA: Diagnosis not present

## 2016-12-15 DIAGNOSIS — H25811 Combined forms of age-related cataract, right eye: Secondary | ICD-10-CM | POA: Diagnosis not present

## 2016-12-16 ENCOUNTER — Encounter: Payer: Self-pay | Admitting: Cardiology

## 2016-12-24 ENCOUNTER — Telehealth: Payer: Self-pay | Admitting: Cardiology

## 2016-12-24 NOTE — Telephone Encounter (Signed)
Spoke w/ pt and requested that he send a manual transmission b/c his home monitor has not updated in at least 7 days.   

## 2016-12-28 DIAGNOSIS — R972 Elevated prostate specific antigen [PSA]: Secondary | ICD-10-CM | POA: Diagnosis not present

## 2016-12-28 DIAGNOSIS — I2510 Atherosclerotic heart disease of native coronary artery without angina pectoris: Secondary | ICD-10-CM | POA: Diagnosis not present

## 2016-12-28 DIAGNOSIS — I10 Essential (primary) hypertension: Secondary | ICD-10-CM | POA: Diagnosis not present

## 2016-12-28 DIAGNOSIS — E039 Hypothyroidism, unspecified: Secondary | ICD-10-CM | POA: Diagnosis not present

## 2016-12-29 ENCOUNTER — Telehealth: Payer: Self-pay

## 2016-12-29 NOTE — Telephone Encounter (Signed)
Called pt to discuss ATP episode from 12/25/2016 at 6:36pm, pt stated that he had taken his medications that day, doesn't recall any symptoms or anything out of the ordinary that day. Pt aware of driving restrictions, informed pt that episode would be reviewed with Dr. [name] and would call him back with any further recommendations. Pt voiced understanding.

## 2016-12-31 DIAGNOSIS — I429 Cardiomyopathy, unspecified: Secondary | ICD-10-CM | POA: Diagnosis not present

## 2016-12-31 DIAGNOSIS — I5022 Chronic systolic (congestive) heart failure: Secondary | ICD-10-CM | POA: Diagnosis not present

## 2016-12-31 DIAGNOSIS — I2510 Atherosclerotic heart disease of native coronary artery without angina pectoris: Secondary | ICD-10-CM | POA: Diagnosis not present

## 2016-12-31 DIAGNOSIS — E039 Hypothyroidism, unspecified: Secondary | ICD-10-CM | POA: Diagnosis not present

## 2017-01-04 DIAGNOSIS — H2512 Age-related nuclear cataract, left eye: Secondary | ICD-10-CM | POA: Diagnosis not present

## 2017-01-04 DIAGNOSIS — H25012 Cortical age-related cataract, left eye: Secondary | ICD-10-CM | POA: Diagnosis not present

## 2017-01-12 DIAGNOSIS — H25812 Combined forms of age-related cataract, left eye: Secondary | ICD-10-CM | POA: Diagnosis not present

## 2017-01-12 DIAGNOSIS — H2512 Age-related nuclear cataract, left eye: Secondary | ICD-10-CM | POA: Diagnosis not present

## 2017-01-12 DIAGNOSIS — H25012 Cortical age-related cataract, left eye: Secondary | ICD-10-CM | POA: Diagnosis not present

## 2017-01-13 ENCOUNTER — Telehealth: Payer: Self-pay | Admitting: Cardiology

## 2017-01-13 NOTE — Telephone Encounter (Signed)
LMOVM requesting that pt send manual transmission b/c home monitor has not updated in at least 7 days.    

## 2017-01-22 ENCOUNTER — Telehealth: Payer: Self-pay

## 2017-01-22 NOTE — Telephone Encounter (Signed)
Patient referred to [facility] clinic by Memory Dance, device RN.  Call to patient and wife answered stating he was not home.  Wife listed on DPR.  ICM intro given and she agreed to monthly ICM follow up calls.  She stated patient's leg has been swollen for the last couple of weeks but is not sure why. She stated she does not know if patient is taking Furosemide daily as prescribed and he has hard time limiting salt intake.  She is worried he has some fluid at this time.  Requested she send remote transmission on 01/25/2017 for review and will call her after it is received.  She stated that would be good and happy that fluid levels can be monitored.  1st ICM remote transmission scheduled for 01/25/2017

## 2017-01-25 ENCOUNTER — Telehealth: Payer: Self-pay | Admitting: Cardiology

## 2017-01-25 ENCOUNTER — Ambulatory Visit (INDEPENDENT_AMBULATORY_CARE_PROVIDER_SITE_OTHER): Payer: Managed Care, Other (non HMO)

## 2017-01-25 DIAGNOSIS — I5022 Chronic systolic (congestive) heart failure: Secondary | ICD-10-CM

## 2017-01-25 DIAGNOSIS — Z95810 Presence of automatic (implantable) cardiac defibrillator: Secondary | ICD-10-CM | POA: Diagnosis not present

## 2017-01-25 NOTE — Telephone Encounter (Signed)
Spoke with pt and reminded pt of remote transmission that is due today. Pt verbalized understanding.   

## 2017-01-25 NOTE — Progress Notes (Signed)
EPIC Encounter for ICM Monitoring  Patient Name: Corey Tyler is a 76 y.o. male Date: 01/25/2017 Primary Care Physican: [name], MD Primary Cardiologist: Hochrein Electrophysiologist: [name] Dry Weight: unknown       Heart Failure questions reviewed, pt asymptomatic.  He says he feels good.  Patient upset with DMV law regarding driving after have ATP on 12/25/2016.  He does not understand why it is restricted if the defibrillator is doing what it is supposed to do.  Advised Dr [name] will discuss with him on 02/18/2017.      Thoracic impedance normal but was abnormal suggesting fluid accumulation between 01/12/2017 and 01/14/2017..  Prescribed and confirmed dosage: Furosemide 40 mg 1 tablet daily  Recommendations: No changes. Advised to limit dietary salt intake to 2000 mg/day and he said he was trying to watch his salt intake. Agreed to monthly ICM calls.  Encouraged to call for fluid symptoms.  Follow-up plan: ICM clinic phone appointment on 03/08/2017.  Defib office check with Dr [name] 02/18/2017 and Dr Percival Spanish 03/09/2016  Copy of ICM check sent to device physician.   3 month ICM trend: 01/25/2017   1 Year ICM trend:      Rosalene Billings, RN 01/25/2017 1:29 PM

## 2017-02-04 ENCOUNTER — Other Ambulatory Visit: Payer: Self-pay | Admitting: Internal Medicine

## 2017-02-09 ENCOUNTER — Encounter: Payer: Self-pay | Admitting: Internal Medicine

## 2017-02-12 ENCOUNTER — Ambulatory Visit: Payer: Medicare Other | Admitting: Cardiology

## 2017-02-16 ENCOUNTER — Encounter: Payer: Self-pay | Admitting: Cardiology

## 2017-02-16 ENCOUNTER — Ambulatory Visit (INDEPENDENT_AMBULATORY_CARE_PROVIDER_SITE_OTHER): Payer: Managed Care, Other (non HMO) | Admitting: Cardiology

## 2017-02-16 VITALS — BP 116/62 | HR 66 | Ht 73.0 in | Wt 204.0 lb

## 2017-02-16 DIAGNOSIS — I472 Ventricular tachycardia, unspecified: Secondary | ICD-10-CM

## 2017-02-16 DIAGNOSIS — I480 Paroxysmal atrial fibrillation: Secondary | ICD-10-CM | POA: Diagnosis not present

## 2017-02-16 DIAGNOSIS — I5022 Chronic systolic (congestive) heart failure: Secondary | ICD-10-CM

## 2017-02-16 DIAGNOSIS — E785 Hyperlipidemia, unspecified: Secondary | ICD-10-CM | POA: Diagnosis not present

## 2017-02-16 DIAGNOSIS — I255 Ischemic cardiomyopathy: Secondary | ICD-10-CM

## 2017-02-16 MED ORDER — PRAVASTATIN SODIUM 40 MG PO TABS: 40.0000 mg | ORAL_TABLET | Freq: Every evening | ORAL | 3 refills | Status: DC

## 2017-02-16 NOTE — Patient Instructions (Signed)
Medication Instructions:  START- pravastatin 40 mg daily  Labwork: None Ordered  Testing/Procedures: None Ordered  Follow-Up: Your physician wants you to follow-up in: 6 Months. You will receive a reminder letter in the mail two months in advance. If you don't receive a letter, please call our office to schedule the follow-up appointment.   Any Other Special Instructions Will Be Listed Below (If Applicable).   If you need a refill on your cardiac medications before your next appointment, please call your pharmacy.

## 2017-02-16 NOTE — Progress Notes (Signed)
HPI The patient presents for follow up of acute on chronic systolic heart failure and atrial fibrillation.  He has had GI bleeding last fall on Xarelto but is tolerating Eliquis.  He had  a TSH of 17.57 probably related to amiodarone and has been treated with synthroid.  He had VTach in Dec 2014  requiring cardioversion from his device. He had a followup perfusion study that demonstrated a previous inferior large fixed defect but no ischemia.  Since I last saw him he was in the hospital late last year for VT with ATP.  He had amiodarone loaded and was sent home with a taper.  He is very angry that he was told not to drive for six months.  He says that he feels great.  I did review the most recent device check from Feb and he had some VT it appears since that hospitalization.  He is due to see Dr. [name].  He says that he feels better than he has in a long time.  He denies any acute chest pain.  He does have some chronic dyspnea.  He does not report any acute SOB, PND or orthopnea.    Allergies  Allergen Reactions  . Xarelto [Rivaroxaban] Other (See Comments)    Bleeding  . Adhesive [Tape] Hives, Itching and Rash  . Atorvastatin Cough  . Codeine     nauseated  . Isosorbide Nitrate Other (See Comments)    Made him feel bad  . Latex Itching and Other (See Comments)    Rash, itching, burning  . Levofloxacin Other (See Comments)    Tachycardia  . Lisinopril Cough  . Lorazepam Other (See Comments)    "felt bad"  . Sertraline Other (See Comments)    Made him feel bad  . Tiotropium Bromide Monohydrate Swelling    Throat swelling    Current Outpatient Prescriptions  Medication Sig Dispense Refill  . albuterol (PROVENTIL) (2.5 MG/3ML) 0.083% nebulizer solution Take 3 mLs (2.5 mg total) by nebulization every 6 (six) hours as needed for wheezing or shortness of breath. 75 mL 4  . Albuterol Sulfate (PROAIR RESPICLICK) 332 (90 BASE) MCG/ACT AEPB Inhale 2 puffs into the lungs every 6 (six) hours as  needed. (Patient taking differently: Inhale 2 puffs into the lungs every 6 (six) hours as needed (shortness of breath/ wheezing). ) 1 each 2  . amiodarone (PACERONE) 200 MG tablet Take 1 tablet (200 mg total) by mouth daily. 90 tablet 1  . carvedilol (COREG) 12.5 MG tablet TAKE ONE TABLET BY MOUTH TWICE DAILY WITH MEALS 60 tablet 4  . cholecalciferol (VITAMIN D) 1000 UNITS tablet Take 1,000 Units by mouth daily.    Marland Kitchen ELIQUIS 5 MG TABS tablet TAKE 1 TABLET BY MOUTH TWICE A DAY 60 tablet 5  . finasteride (PROSCAR) 5 MG tablet Take 5 mg by mouth daily.     . Fluticasone-Salmeterol (ADVAIR DISKUS) 250-50 MCG/DOSE AEPB Inhale 1 puff into the lungs 2 (two) times daily. (Patient taking differently: Inhale 1 puff into the lungs daily. ) 60 each 11  . furosemide (LASIX) 40 MG tablet TAKE ONE TABLET BY MOUTH ONCE DAILY 90 tablet 3  . levothyroxine (SYNTHROID, LEVOTHROID) 112 MCG tablet Take 112 mcg by mouth daily before breakfast.     . loratadine (CLARITIN) 10 MG tablet Take 10 mg by mouth daily.    Marland Kitchen losartan (COZAAR) 100 MG tablet Take 50 mg by mouth 2 (two) times daily.    . Melatonin 5 MG CAPS  Take 5 mg by mouth at bedtime.     . Multiple Vitamin (MULTIVITAMIN WITH MINERALS) TABS tablet Take 1 tablet by mouth daily.    . nitroGLYCERIN (NITROSTAT) 0.4 MG SL tablet Place 0.4 mg under the tongue every 5 (five) minutes as needed for chest pain.     . OXYGEN Inhale 2 L into the lungs See admin instructions. Use oxygen whenever resting    . pantoprazole (PROTONIX) 40 MG tablet Take 1 tablet (40 mg total) by mouth daily. 31 tablet 11  . POTASSIUM PO Take 1 tablet by mouth at bedtime.    . predniSONE (DELTASONE) 10 MG tablet Take 2 tablets (20 mg total) by mouth daily with breakfast. 5 tablet 0  . Psyllium (METAMUCIL PO) Take 15 mLs by mouth daily. Mix in liquid and drink    . Tamsulosin HCl (FLOMAX) 0.4 MG CAPS Take 0.4 mg by mouth at bedtime.     . pravastatin (PRAVACHOL) 40 MG tablet Take 1 tablet (40 mg  total) by mouth every evening. 90 tablet 3   No current facility-administered medications for this visit.     Past Medical History:  Diagnosis Date  . A-fib ([place])    a. Noted on 12/2012 interrogation, placed on Apixaban and ultimately stopped NOACs 2/2 personal decision 8/14.  . Arthritis   . Atrial tachycardia-non sustained    a. Noted on 03/2012 interrogation.  . Cardiac defibrillator -dual  [employer]   . Chronic systolic heart failure (HCC)    EF down to 20 to 25% per echo November 2012  . COPD (chronic obstructive pulmonary disease) ([place])   . Hyperlipidemia   . Hypertension   . ICD (implantable cardiac defibrillator) in place   . NICM (nonischemic cardiomyopathy) ([place])    a. Normal cors 2000. b. Minimal plaque 2013.  . Old myocardial infarction    a. ?Silent MI. b. Large fixed inferolateral defect c/w with prior infarct, no ischemia. c. Cath in 2000/2013 with only minimal CAD.  Marland Kitchen Pulmonary nodule, right    Last scan in 2010 showing stability; felt to be benign.  Marland Kitchen PVC's (premature ventricular contractions)   . Ventricular tachycardia, polymorphic ([place])    Rx via ICD 12/14    Past Surgical History:  Procedure Laterality Date  . APPENDECTOMY    . CARDIAC CATHETERIZATION  2000  . CARDIAC DEFIBRILLATOR PLACEMENT    . CARDIOVERSION  03/30/2013   Procedure: CARDIOVERSION;  Surgeon: Thayer Headings, MD;  Location: [facility];  Service: Cardiovascular;;  . COLON SURGERY    . COLONOSCOPY N/A 09/25/2013   Procedure: COLONOSCOPY;  Surgeon: Jerene Bears, MD;  Location: [facility] ENDOSCOPY;  Service: Endoscopy;  Laterality: N/A;  . ICD  12/2011   [name]  . IMPLANTABLE CARDIOVERTER DEFIBRILLATOR IMPLANT N/A 01/06/2012   Procedure: IMPLANTABLE CARDIOVERTER DEFIBRILLATOR IMPLANT;  Surgeon: Deboraha Sprang, MD;  Location: [facility] C[facility] LAB;  Service: Cardiovascular;  Laterality: N/A;  . TEE WITHOUT CARDIOVERSION N/A 03/30/2013   Procedure: TRANSESOPHAGEAL ECHOCARDIOGRAM (TEE);  Surgeon: Thayer Headings, MD;   Location: [facility] ENDOSCOPY;  Service: Cardiovascular;  Laterality: N/A;    ROS:  As stated in the HPI and negative for all other systems.   PHYSICAL EXAM BP 116/62   Pulse 66   Ht 6\' 1"  (1.854 m)   Wt 204 lb (92.5 kg)   BMI 26.91 kg/m  GENERAL:  Well appearing NECK:  No jugular venous distention but hepatojugular reflux noted, waveform within normal limits, carotid upstroke brisk and symmetric,  no bruits, no thyromegaly LUNGS:  Clear to auscultation bilaterally BACK:  No CVA tenderness CHEST:  Well healed ICD pocket.   HEART:  PMI not displaced or sustained,S1 and S2 within normal limits, no S3, no S4, no clicks, no rubs, 2/6 holosystolic apical systolic murmur, no diastolic murmur ABD:  Flat, positive bowel sounds normal in frequency in pitch, no bruits, no rebound, no guarding, no midline pulsatile mass, no hepatomegaly, no splenomegaly EXT:  2 plus pulses throughout, trace ankle edema, no cyanosis no clubbing    ASSESSMENT AND PLAN   Cardiomyopathy - The patient hasn't tolerated beta blocker titration or other med titration in the past. He is actually euvolemic. He will continue on the meds as listed. No change in therapy.   Atrial fibrillation - He had his amio recently increased and is now back to 200 mg daily. He's had no problems with his blood thinner.  Mr. FISHEL WAMBLE has a CHA2DS2 - VASc score of 5 with a risk of stroke of 6.7%.  We called his primary care and he is up to date with his labs.  I did review labs from his PCP and his thyroid and liver were OK.  VT - He has follow up later this week with Dr. [name].  HYPERTENSION -  This is being managed in the context of treating his CHF.    CAD - The patient has no new sypmtoms.   He had only minimal plaque at the previous catheterization. I would like for him to be on Pravachol.  He has not tolerated other statins.  He agrees to this.    DYSLIPIDEMIA - His LDL was 89 with an HDL of 42.  Treatment as above.

## 2017-02-17 ENCOUNTER — Ambulatory Visit: Payer: Medicare Other | Admitting: Cardiology

## 2017-02-18 ENCOUNTER — Ambulatory Visit (INDEPENDENT_AMBULATORY_CARE_PROVIDER_SITE_OTHER): Payer: Managed Care, Other (non HMO) | Admitting: Internal Medicine

## 2017-02-18 ENCOUNTER — Encounter: Payer: Self-pay | Admitting: Internal Medicine

## 2017-02-18 VITALS — BP 90/70 | HR 62 | Ht 73.0 in | Wt 205.0 lb

## 2017-02-18 DIAGNOSIS — I480 Paroxysmal atrial fibrillation: Secondary | ICD-10-CM

## 2017-02-18 DIAGNOSIS — Z95810 Presence of automatic (implantable) cardiac defibrillator: Secondary | ICD-10-CM

## 2017-02-18 DIAGNOSIS — I255 Ischemic cardiomyopathy: Secondary | ICD-10-CM

## 2017-02-18 DIAGNOSIS — I5022 Chronic systolic (congestive) heart failure: Secondary | ICD-10-CM | POA: Diagnosis not present

## 2017-02-18 DIAGNOSIS — I2589 Other forms of chronic ischemic heart disease: Secondary | ICD-10-CM

## 2017-02-18 LAB — CUP PACEART INCLINIC DEVICE CHECK
Brady Statistic RA Percent Paced: 99.54 %
Implantable Lead Implant Date: 20130213
Implantable Lead Location: 753859
Implantable Pulse Generator Implant Date: 20130213
Lead Channel Pacing Threshold Amplitude: 0.5 V
Lead Channel Pacing Threshold Pulse Width: 0.5 ms
Lead Channel Sensing Intrinsic Amplitude: 12 mV
Lead Channel Sensing Intrinsic Amplitude: 2.7 mV
MDC IDC LEAD IMPLANT DT: 20130213
MDC IDC LEAD LOCATION: 753860
MDC IDC SESS DTM: 20180329152130
MDC IDC SET LEADCHNL RA PACING AMPLITUDE: 1.625
MDC IDC SET LEADCHNL RV PACING AMPLITUDE: 2.5 V
MDC IDC SET LEADCHNL RV PACING PULSEWIDTH: 0.6 ms
MDC IDC SET LEADCHNL RV SENSING SENSITIVITY: 0.5 mV
MDC IDC STAT BRADY RV PERCENT PACED: 0 %
Pulse Gen Serial Number: 7003419

## 2017-02-18 NOTE — Progress Notes (Signed)
Patient Care Team: [name], MD as PCP - General (Internal Medicine) Elsie Stain, MD (Pulmonary Disease)   HPI  Corey Tyler is a 76 y.o. male Seen in followup for ICD implantation 2/13 for primary prevention. This occurred in the context of ischemic/nonischemic cardiomyopathy with underlying bradycardia.    December 2014 he had appropriate ICD discharge for polymorphic ventricular tachycardia. His Coreg was up titrated.    Subsequent Myoview scan was not gated; it demonstrated a large inferolateral and anterolateral scar without ischemia.  He also has a history of atrial fibrillation. In the past he was treated with Rivaroxaban which was complicated by GI bleeding. He was then treated with apixaban. He is also been treated with amiodarone.  12/17 he was admitted with sustained ventricular tachycardia below his detection rate. Amiodarone was uptitrated device was reprogrammed.  No intercurrent VT of which he is aware. He is feeling better now with recent down titration of his amiodarone.  He is having some orthostatic lightheadedness manifesting primarily when standing. He has had blood pressures in the 90-110 range at home.   DATE TEST    2008 echo EF40-45%   1/14    echo   EF 15 %   8/17    echo   EF 25 %    5/15 TSH   8.63` ALT 23 12/17 TSH  4.55 ALT 18       Past Medical History:  Diagnosis Date  . A-fib ([place])    a. Noted on 12/2012 interrogation, placed on Apixaban and ultimately stopped NOACs 2/2 personal decision 8/14.  . Arthritis   . Atrial tachycardia-non sustained    a. Noted on 03/2012 interrogation.  . Cardiac defibrillator -dual  [employer]   . Chronic systolic heart failure (HCC)    EF down to 20 to 25% per echo November 2012  . COPD (chronic obstructive pulmonary disease) ([place])   . Hyperlipidemia   . Hypertension   . ICD (implantable cardiac defibrillator) in place   . NICM (nonischemic cardiomyopathy) ([place])    a. Normal cors 2000. b.  Minimal plaque 2013.  . Old myocardial infarction    a. ?Silent MI. b. Large fixed inferolateral defect c/w with prior infarct, no ischemia. c. Cath in 2000/2013 with only minimal CAD.  Marland Kitchen Pulmonary nodule, right    Last scan in 2010 showing stability; felt to be benign.  Marland Kitchen PVC's (premature ventricular contractions)   . Ventricular tachycardia, polymorphic ([place])    Rx via ICD 12/14    Past Surgical History:  Procedure Laterality Date  . APPENDECTOMY    . CARDIAC CATHETERIZATION  2000  . CARDIAC DEFIBRILLATOR PLACEMENT    . CARDIOVERSION  03/30/2013   Procedure: CARDIOVERSION;  Surgeon: Thayer Headings, MD;  Location: [facility];  Service: Cardiovascular;;  . COLON SURGERY    . COLONOSCOPY N/A 09/25/2013   Procedure: COLONOSCOPY;  Surgeon: Jerene Bears, MD;  Location: [facility] ENDOSCOPY;  Service: Endoscopy;  Laterality: N/A;  . ICD  12/2011   [name]  . IMPLANTABLE CARDIOVERTER DEFIBRILLATOR IMPLANT N/A 01/06/2012   Procedure: IMPLANTABLE CARDIOVERTER DEFIBRILLATOR IMPLANT;  Surgeon: Deboraha Sprang, MD;  Location: [facility] CATH LAB;  Service: Cardiovascular;  Laterality: N/A;  . TEE WITHOUT CARDIOVERSION N/A 03/30/2013   Procedure: TRANSESOPHAGEAL ECHOCARDIOGRAM (TEE);  Surgeon: Thayer Headings, MD;  Location: [facility] ENDOSCOPY;  Service: Cardiovascular;  Laterality: N/A;    Current Outpatient Prescriptions  Medication Sig Dispense Refill  . albuterol (PROVENTIL) (2.5  MG/3ML) 0.083% nebulizer solution Take 3 mLs (2.5 mg total) by nebulization every 6 (six) hours as needed for wheezing or shortness of breath. 75 mL 4  . Albuterol Sulfate (PROAIR RESPICLICK) 425 (90 BASE) MCG/ACT AEPB Inhale 2 puffs into the lungs every 6 (six) hours as needed. (Patient taking differently: Inhale 2 puffs into the lungs every 6 (six) hours as needed (shortness of breath/ wheezing). ) 1 each 2  . amiodarone (PACERONE) 200 MG tablet Take 1 tablet (200 mg total) by mouth daily. 90 tablet 1  . carvedilol (COREG) 12.5 MG tablet TAKE  ONE TABLET BY MOUTH TWICE DAILY WITH MEALS 60 tablet 4  . cholecalciferol (VITAMIN D) 1000 UNITS tablet Take 1,000 Units by mouth daily.    Marland Kitchen ELIQUIS 5 MG TABS tablet TAKE 1 TABLET BY MOUTH TWICE A DAY 60 tablet 5  . finasteride (PROSCAR) 5 MG tablet Take 5 mg by mouth daily.     . Fluticasone-Salmeterol (ADVAIR DISKUS) 250-50 MCG/DOSE AEPB Inhale 1 puff into the lungs 2 (two) times daily. (Patient taking differently: Inhale 1 puff into the lungs daily. ) 60 each 11  . furosemide (LASIX) 40 MG tablet TAKE ONE TABLET BY MOUTH ONCE DAILY 90 tablet 3  . levothyroxine (SYNTHROID, LEVOTHROID) 112 MCG tablet Take 112 mcg by mouth daily before breakfast.     . loratadine (CLARITIN) 10 MG tablet Take 10 mg by mouth daily.    Marland Kitchen losartan (COZAAR) 100 MG tablet Take 50 mg by mouth 2 (two) times daily.    . Melatonin 5 MG CAPS Take 5 mg by mouth at bedtime.     . Multiple Vitamin (MULTIVITAMIN WITH MINERALS) TABS tablet Take 1 tablet by mouth daily.    . nitroGLYCERIN (NITROSTAT) 0.4 MG SL tablet Place 0.4 mg under the tongue every 5 (five) minutes as needed for chest pain.     . OXYGEN Inhale 2 L into the lungs See admin instructions. Use oxygen whenever resting    . pantoprazole (PROTONIX) 40 MG tablet Take 1 tablet (40 mg total) by mouth daily. 31 tablet 11  . POTASSIUM PO Take 1 tablet by mouth at bedtime.    . pravastatin (PRAVACHOL) 40 MG tablet Take 1 tablet (40 mg total) by mouth every evening. 90 tablet 3  . predniSONE (DELTASONE) 10 MG tablet Take 2 tablets (20 mg total) by mouth daily with breakfast. 5 tablet 0  . Psyllium (METAMUCIL PO) Take 15 mLs by mouth daily. Mix in liquid and drink    . Tamsulosin HCl (FLOMAX) 0.4 MG CAPS Take 0.4 mg by mouth at bedtime.      No current facility-administered medications for this visit.     Allergies  Allergen Reactions  . Xarelto [Rivaroxaban] Other (See Comments)    Bleeding  . Adhesive [Tape] Hives, Itching and Rash  . Atorvastatin Cough  .  Codeine     nauseated  . Isosorbide Nitrate Other (See Comments)    Made him feel bad  . Latex Itching and Other (See Comments)    Rash, itching, burning  . Levofloxacin Other (See Comments)    Tachycardia  . Lisinopril Cough  . Lorazepam Other (See Comments)    "felt bad"  . Sertraline Other (See Comments)    Made him feel bad  . Tiotropium Bromide Monohydrate Swelling    Throat swelling    Review of Systems negative except from HPI and PMH  Physical Exam BP 90/70   Pulse 62   Ht 6'  1" (1.854 m)   Wt 205 lb (93 kg)   SpO2 93%   BMI 27.05 kg/m  Well developed and nourished in no acute distress HENT normal Neck supple with JVP-flat Clear Device pocket well healed; without hematoma or erythema.  There is no tethering  Regular rate and rhythm, no murmurs or gallops Abd-soft with active BS No Clubbing cyanosis right greater than left plus edema Skin-warm and dry healing excoriation right shin with surrounding erythema A & Oriented  Grossly normal sensory and motor function Device pocket well healed; without hematoma or erythema.  There is no tethering   ECG ordered today demonstrates  atrial pacing at 60 Intervals 17/12/47 IMI  Assessment and plan  Atrial fibrillation paroxysmal/persistent   High risk medication  Chronotropic incompetence   Implantable defibrillator-[name]  The patient's device was interrogated and the information was fully reviewed.  The device was reprogrammed as below  Ischemic/nonischemic cardiomyopathy  Congestive heart failure-chronic-systolic  Ventricular tachycardia  Hypotension  Orthostatic lightheadedness  Without symptoms of ischemia  No intercurrent Ventricular tachycardia-sustained. There are nonsustained episodes at a rate of about 138. Hence, with his detection at 134 we have lowered it--126. We have increased the number ATP episodes   Infrequent afib--controlled rate.   On Anticoagulation;  No bleeding issues    Amiodarone doses labs ok  Corevue reviewed   Euvolemic continue current meds  With dizziness and low blood pressure we will decrease his losartan 50 twice a day--daily.

## 2017-02-18 NOTE — Patient Instructions (Addendum)
Medication Instructions: - Your physician has recommended you make the following change in your medication:  1) Decrease losartan 100mg - take 1/2 tablet (50 mg) by mouth once daily  Labwork: - none ordered  Procedures/Testing: - none ordered  Follow-Up: - Remote monitoring is used to monitor your Pacemaker of ICD from home. This monitoring reduces the number of office visits required to check your device to one time per year. It allows [place] to keep an eye on the functioning of your device to ensure it is working properly. You are scheduled for a device check from home on 05/20/17. You may send your transmission at any time that day. If you have a wireless device, the transmission will be sent automatically. After your physician reviews your transmission, you will receive a postcard with your next transmission date.  - Your physician wants you to follow-up in: 6 months with Dr. [name]. You will receive a reminder letter in the mail two months in advance. If you don't receive a letter, please call our office to schedule the follow-up appointment.   Any Additional Special Instructions Will Be Listed Below (If Applicable).     If you need a refill on your cardiac medications before your next appointment, please call your pharmacy.

## 2017-03-05 ENCOUNTER — Telehealth: Payer: Self-pay | Admitting: Cardiology

## 2017-03-05 DIAGNOSIS — R42 Dizziness and giddiness: Secondary | ICD-10-CM | POA: Diagnosis not present

## 2017-03-05 DIAGNOSIS — I10 Essential (primary) hypertension: Secondary | ICD-10-CM | POA: Diagnosis not present

## 2017-03-05 DIAGNOSIS — E039 Hypothyroidism, unspecified: Secondary | ICD-10-CM | POA: Diagnosis not present

## 2017-03-05 NOTE — Telephone Encounter (Signed)
New message    Pt c/o medication issue:  1. Name of Medication: albuterol (PROVENTIL) (2.5 MG/3ML) 0.083% nebulizer solution  2. How are you currently taking this medication (dosage and times per day)?2.5nmg  3. Are you having a reaction (difficulty breathing--STAT)? NO  4. What is your medication issue? Pt states that his PCP Dr. Maudie Mercury wants to lower his albuterol (PROVENTIL) (2.5 MG/3ML) 0.083% nebulizer solution that was prescribed and the pt requests a call back about thsi

## 2017-03-05 NOTE — Telephone Encounter (Signed)
Returned call to patient-reports he saw Dr. Maudie Mercury (PCP) and he recommended decreasing his amiodarone due to low BP.   Also reports Dr. Maudie Mercury started him on Entresto 24-26mg  on 2/8 and stopped losartan (med list not updated).  Also reports Dr. Maudie Mercury decreased his Coreg to 3.125 BID today.  Reports feeling weak and dizzy.  BP this AM 98/52 pulse 62.   Reports Dr. Maudie Mercury wanted patient to reach out to Dr. Percival Spanish for further medication changes.    Per chart review-patient was seen on 3/27 by Dr. Percival Spanish and 3/29 by Dr. [name] and these med changes were not reported.  BP with Dr. [name] 90/70-losartan was decreased, per patient report he is not taking losartan and has not since 2/8 when Entresto was started?   Hx: ICD, ICM, bradycardia, VT, Afib, CHF.  Attempted to get OV note from visit today with PCP to confirm medication changes-office closed.   Routed to MD for recommendations.

## 2017-03-06 NOTE — Telephone Encounter (Signed)
Need to get office note to review.

## 2017-03-08 ENCOUNTER — Telehealth: Payer: Self-pay | Admitting: Cardiology

## 2017-03-08 ENCOUNTER — Ambulatory Visit (INDEPENDENT_AMBULATORY_CARE_PROVIDER_SITE_OTHER): Payer: Managed Care, Other (non HMO)

## 2017-03-08 DIAGNOSIS — Z95810 Presence of automatic (implantable) cardiac defibrillator: Secondary | ICD-10-CM

## 2017-03-08 DIAGNOSIS — I5022 Chronic systolic (congestive) heart failure: Secondary | ICD-10-CM

## 2017-03-08 NOTE — Telephone Encounter (Signed)
LMOVM reminding pt to send remote transmission.   

## 2017-03-09 ENCOUNTER — Ambulatory Visit: Payer: Medicare Other | Admitting: Cardiology

## 2017-03-09 NOTE — Telephone Encounter (Signed)
OV given to Dr Percival Spanish for review.Marland Kitchen

## 2017-03-12 ENCOUNTER — Telehealth: Payer: Self-pay | Admitting: *Deleted

## 2017-03-12 DIAGNOSIS — I480 Paroxysmal atrial fibrillation: Secondary | ICD-10-CM | POA: Diagnosis not present

## 2017-03-12 DIAGNOSIS — I10 Essential (primary) hypertension: Secondary | ICD-10-CM | POA: Diagnosis not present

## 2017-03-12 NOTE — Telephone Encounter (Signed)
Alert received this morning for VT episode with ATP therapy on 03/06/17 AM.   Corey Tyler reports that he didn't feel anything at the time of the episode and he has been feeling great. About 2 weeks ago he saw Dr. Maudie Mercury and he was started on Entresto at that time. His Coreg was decreased from 12.5mg  BID to 3.125mg  BID and his losartan was discontinued due to low BP.   He was made aware of [place] driving restrictions. I will review this information with Dr. [name] next week and call Corey Tyler back with any recommendations. Corey Tyler understanding.

## 2017-03-12 NOTE — Progress Notes (Signed)
EPIC Encounter for ICM Monitoring  Patient Name: Corey Tyler is a 76 y.o. male Date: 03/12/2017 Primary Care Physican: [name], MD Primary Cardiologist: Hochrein Electrophysiologist: [name] Dry Weight:    unknown       Call to patient.  He was very upset that he had a call from device clinic nurse saying he had VT episode.  He said he feels fine and does not believe that he is having these problems.  He reported he would be able to feel the episodes if he was having them.   He used inappropriate language during conversation.  He said he is tired of the calls telling him these problems because he feels just fine.  He is tired about the [facility] law and does not want to hear it anymore. He said he did not care what any one said he would do what ever he wanted to.  He reported he will be going to the [place] to be followed.  Advised I would stop the ICM calls.     Thoracic impedance abnormal suggesting fluid accumulation starting 03/05/2017 but is trending back toward baseline 03/11/2017.  Prescribed and confirmed dosage: Furosemide 40 mg 1 tablet daily  Labs: 12/28/2016 Creatinine 1.18, BUN 15,  Potassium 5.0, Sodium 141, EGFR >60  Recommendations:  Patient disenrolled from [facility] monthly follow up but will continue to be followed by device clinic.    Follow-up plan: None  Copy of ICM check sent to primary cardiologist and device physician.   3 month ICM trend: 03/11/2017   Direct Trend Viewer    1 Year ICM trend:      Rosalene Billings, RN 03/12/2017 8:02 AM

## 2017-03-25 ENCOUNTER — Encounter: Payer: Self-pay | Admitting: Internal Medicine

## 2017-03-25 ENCOUNTER — Ambulatory Visit (INDEPENDENT_AMBULATORY_CARE_PROVIDER_SITE_OTHER): Payer: Managed Care, Other (non HMO) | Admitting: Internal Medicine

## 2017-03-25 VITALS — BP 110/68 | HR 69 | Ht 73.0 in | Wt 202.6 lb

## 2017-03-25 DIAGNOSIS — Z79899 Other long term (current) drug therapy: Secondary | ICD-10-CM

## 2017-03-25 DIAGNOSIS — I255 Ischemic cardiomyopathy: Secondary | ICD-10-CM | POA: Diagnosis not present

## 2017-03-25 DIAGNOSIS — I428 Other cardiomyopathies: Secondary | ICD-10-CM

## 2017-03-25 DIAGNOSIS — I2589 Other forms of chronic ischemic heart disease: Secondary | ICD-10-CM

## 2017-03-25 DIAGNOSIS — I472 Ventricular tachycardia, unspecified: Secondary | ICD-10-CM

## 2017-03-25 DIAGNOSIS — Z95810 Presence of automatic (implantable) cardiac defibrillator: Secondary | ICD-10-CM

## 2017-03-25 DIAGNOSIS — I5022 Chronic systolic (congestive) heart failure: Secondary | ICD-10-CM

## 2017-03-25 DIAGNOSIS — I480 Paroxysmal atrial fibrillation: Secondary | ICD-10-CM | POA: Diagnosis not present

## 2017-03-25 MED ORDER — PROPRANOLOL HCL ER 80 MG PO CP24: 80.0000 mg | ORAL_CAPSULE | Freq: Every day | ORAL | Status: DC

## 2017-03-25 NOTE — Patient Instructions (Addendum)
Medication Instructions: - Your physician has recommended you make the following change in your medication:  1) Stop coreg (carvedilol) 2) Start Inderal LA (propranolol) 80 mg- take one tablet by mouth every evening  Labwork: - none ordered  Procedures/Testing: - none ordered  Follow-Up: - You have been referred to : Dr. Cristopher Peru- follow up in 6 weeks for consideration of VT ablation  - Your physician recommends that you schedule a follow-up appointment in: 3 months with Dr. [name].      Any Additional Special Instructions Will Be Listed Below (If Applicable).     If you need a refill on your cardiac medications before your next appointment, please call your pharmacy.

## 2017-03-25 NOTE — Progress Notes (Signed)
Patient Care Team: [name], MD as PCP - General (Internal Medicine) Elsie Stain, MD (Pulmonary Disease)   HPI  Corey Tyler is a 76 y.o. male Seen in followup for ICD implantation 2/13 for primary prevention. This occurred in the context of ischemic/nonischemic cardiomyopathy with underlying bradycardia.    December 2014 he had appropriate ICD discharge for polymorphic ventricular tachycardia. His Coreg was up titrated.   Subsequent Myoview scan was not gated; it demonstrated a large inferolateral and anterolateral scar without ischemia.  He also has a history of atrial fibrillation. In the past he was treated with Rivaroxaban which was complicated by GI bleeding. He was then treated with apixaban. He is also been treated with amiodarone.  12/17 he was admitted with sustained ventricular tachycardia below his detection rate. Amiodarone was uptitrated device was reprogrammed.   He is having some orthostatic lightheadedness manifesting primarily when standing. He has had blood pressures in the 90-110 range at home.  He has continued to have episodes of Ventricular tachycardia which were below detection rate and the device was reprogramed  He is largely unaware; he has noted no change in exercise tolerance. He's had no chest pain.  Recently he has been started on Entresto; his carvedilol was done titrated because of hypotension   DATE TEST    2008 echo EF40-45%   1/14    echo   EF 15 %   8/17    echo   EF 25 %    5/15 TSH    8.63` ALT  23 12/17 TSH  4.55 ALT 18       Past Medical History:  Diagnosis Date  . A-fib ([address])    a. Noted on 12/2012 interrogation, placed on Apixaban and ultimately stopped NOACs 2/2 personal decision 8/14.  . Arthritis   . Atrial tachycardia-non sustained    a. Noted on 03/2012 interrogation.  . Cardiac defibrillator -dual  [employer]   . Chronic systolic heart failure (HCC)    EF down to 20 to 25% per echo November 2012  . COPD  (chronic obstructive pulmonary disease) ([place])   . Hyperlipidemia   . Hypertension   . ICD (implantable cardiac defibrillator) in place   . NICM (nonischemic cardiomyopathy) ([place])    a. Normal cors 2000. b. Minimal plaque 2013.  . Old myocardial infarction    a. ?Silent MI. b. Large fixed inferolateral defect c/w with prior infarct, no ischemia. c. Cath in 2000/2013 with only minimal CAD.  Marland Kitchen Pulmonary nodule, right    Last scan in 2010 showing stability; felt to be benign.  Marland Kitchen PVC's (premature ventricular contractions)   . Ventricular tachycardia, polymorphic ([place])    Rx via ICD 12/14    Past Surgical History:  Procedure Laterality Date  . APPENDECTOMY    . CARDIAC CATHETERIZATION  2000  . CARDIAC DEFIBRILLATOR PLACEMENT    . CARDIOVERSION  03/30/2013   Procedure: CARDIOVERSION;  Surgeon: Thayer Headings, MD;  Location: [facility];  Service: Cardiovascular;;  . COLON SURGERY    . COLONOSCOPY N/A 09/25/2013   Procedure: COLONOSCOPY;  Surgeon: Jerene Bears, MD;  Location: [facility] ENDOSCOPY;  Service: Endoscopy;  Laterality: N/A;  . ICD  12/2011   [name]  . IMPLANTABLE CARDIOVERTER DEFIBRILLATOR IMPLANT N/A 01/06/2012   Procedure: IMPLANTABLE CARDIOVERTER DEFIBRILLATOR IMPLANT;  Surgeon: Deboraha Sprang, MD;  Location[facility] CATH LAB;  Service: Cardiovascular;  Laterality: N/A;  . TEE WITHOUT CARDIOVERSION N/A 03/30/2013   Procedure:  TRANSESOPHAGEAL ECHOCARDIOGRAM (TEE);  Surgeon: Thayer Headings, MD;  Location[facility] ENDOSCOPY;  Service: Cardiovascular;  Laterality: N/A;    Current Outpatient Prescriptions  Medication Sig Dispense Refill  . albuterol (PROVENTIL) (2.5 MG/3ML) 0.083% nebulizer solution Take 3 mLs (2.5 mg total) by nebulization every 6 (six) hours as needed for wheezing or shortness of breath. 75 mL 4  . Albuterol Sulfate (PROAIR RESPICLICK) 619 (90 BASE) MCG/ACT AEPB Inhale 2 puffs into the lungs every 6 (six) hours as needed. (Patient taking differently: Inhale 2 puffs into the lungs  every 6 (six) hours as needed (shortness of breath/ wheezing). ) 1 each 2  . amiodarone (PACERONE) 200 MG tablet Take 1 tablet (200 mg total) by mouth daily. 90 tablet 1  . carvedilol (COREG) 3.125 MG tablet Take 3.125 mg by mouth 2 (two) times daily with a meal.    . cholecalciferol (VITAMIN D) 1000 UNITS tablet Take 1,000 Units by mouth daily.    Marland Kitchen ELIQUIS 5 MG TABS tablet TAKE 1 TABLET BY MOUTH TWICE A DAY 60 tablet 5  . finasteride (PROSCAR) 5 MG tablet Take 5 mg by mouth daily.     . Fluticasone-Salmeterol (ADVAIR DISKUS) 250-50 MCG/DOSE AEPB Inhale 1 puff into the lungs 2 (two) times daily. (Patient taking differently: Inhale 1 puff into the lungs daily. ) 60 each 11  . furosemide (LASIX) 40 MG tablet TAKE ONE TABLET BY MOUTH ONCE DAILY 90 tablet 3  . levothyroxine (SYNTHROID, LEVOTHROID) 112 MCG tablet Take 112 mcg by mouth daily before breakfast.     . loratadine (CLARITIN) 10 MG tablet Take 10 mg by mouth daily.    Marland Kitchen losartan (COZAAR) 100 MG tablet Take 1/2 tablet (50 mg) by mouth once daily    . Melatonin 5 MG CAPS Take 5 mg by mouth at bedtime.     . Multiple Vitamin (MULTIVITAMIN WITH MINERALS) TABS tablet Take 1 tablet by mouth daily.    . nitroGLYCERIN (NITROSTAT) 0.4 MG SL tablet Place 0.4 mg under the tongue every 5 (five) minutes as needed for chest pain.     . OXYGEN Inhale 2 L into the lungs See admin instructions. Use oxygen whenever resting    . pantoprazole (PROTONIX) 40 MG tablet Take 1 tablet (40 mg total) by mouth daily. 31 tablet 11  . POTASSIUM PO Take 1 tablet by mouth at bedtime.    . pravastatin (PRAVACHOL) 40 MG tablet Take 1 tablet (40 mg total) by mouth every evening. 90 tablet 3  . Psyllium (METAMUCIL PO) Take 15 mLs by mouth daily. Mix in liquid and drink    . sacubitril-valsartan (ENTRESTO) 24-26 MG Take 1 tablet by mouth 2 (two) times daily.    . Tamsulosin HCl (FLOMAX) 0.4 MG CAPS Take 0.4 mg by mouth at bedtime.      No current facility-administered  medications for this visit.     Allergies  Allergen Reactions  . Xarelto [Rivaroxaban] Other (See Comments)    Bleeding  . Adhesive [Tape] Hives, Itching and Rash  . Atorvastatin Cough  . Codeine     nauseated  . Isosorbide Nitrate Other (See Comments)    Made him feel bad  . Latex Itching and Other (See Comments)    Rash, itching, burning  . Levofloxacin Other (See Comments)    Tachycardia  . Lisinopril Cough  . Lorazepam Other (See Comments)    "felt bad"  . Sertraline Other (See Comments)    Made him feel bad  . Tiotropium  Bromide Monohydrate Swelling    Throat swelling    Review of Systems negative except from HPI and PMH  Physical Exam BP 110/68   Pulse 69   Ht 6\' 1"  (1.854 m)   Wt 202 lb 9.6 oz (91.9 kg)   SpO2 93%   BMI 26.73 kg/m  Well developed and nourished in no acute distress HENT normal Neck supple with JVP-flat Clear Device pocket well healed; without hematoma or erythema.  There is no tethering  Regular rate and rhythm, no murmurs or gallops Abd-soft with active BS No Clubbing cyanosis right greater than left plus edema Skin-warm and dry healing excoriation right shin with surrounding erythema A & Oriented  Grossly normal sensory and motor function Device pocket well healed; without hematoma or erythema.  There is no tethering   ECG ordered today demonstrates  atrial pacing at 63 Intervals 22/11/45 IMI  Assessment and plan  Atrial fibrillation paroxysmal/persistent   High risk medication  Chronotropic incompetence   Implantable defibrillator-[name]  The patient's device was interrogated and the information was fully reviewed.  The device was reprogrammed as below  Ischemic/nonischemic cardiomyopathy  Congestive heart failure-chronic-systolic  Ventricular tachycardia--recurrent   Hypotension     He continues to have ventricular tachycardia. It has been paced terminated. It is monomorphic. 2 options present themselves, the first is  adjunctive medication and the second is catheter ablation. We will undertake these efforts simultaneously. I'm going to use propranolol in exchange for carvedilol because it has sodium channel blocking characteristics and his current dose of carvedilol is low enough that its utility is not so clear. Alternatively we could use ranolazine.  We will have him take the Inderal at night so as to mitigate his impact on blood pressure  We will also arrange for him to consult with Dr.   Lovena Le for consideration of catheter ablation;  he remains aware of the recommendations regarding driving I will ask Dr. Lovena Le to obtain amiodarone surveillance laboratories with the season in 6 weeks   no interval atrial fibrillation  No chest pain  Euvolemic continue current meds

## 2017-03-26 ENCOUNTER — Other Ambulatory Visit: Payer: Self-pay | Admitting: Internal Medicine

## 2017-03-26 MED ORDER — PROPRANOLOL HCL ER 80 MG PO CP24: 80.0000 mg | ORAL_CAPSULE | Freq: Every day | ORAL | 11 refills | Status: DC

## 2017-03-26 MED ORDER — SACUBITRIL-VALSARTAN 24-26 MG PO TABS: 1.0000 | ORAL_TABLET | Freq: Two times a day (BID) | ORAL | 11 refills | Status: DC

## 2017-03-26 NOTE — Telephone Encounter (Signed)
Pt called stating that his medication was not sent in to his pharmacy when came in to see Dr. [name] on 03/25/17. Pt's medication was sent, but did not go to the pharmacy because it was on print. Pt's medication was resent to his pharmacy as requested. Confirmation received.

## 2017-04-07 ENCOUNTER — Telehealth: Payer: Self-pay | Admitting: Cardiology

## 2017-04-07 NOTE — Telephone Encounter (Signed)
Spoke w/ pt and requested that he send a manual transmission b/c his home monitor has not updated in at least 7 days.   

## 2017-04-09 ENCOUNTER — Other Ambulatory Visit: Payer: Self-pay | Admitting: Internal Medicine

## 2017-04-09 LAB — CUP PACEART INCLINIC DEVICE CHECK
Brady Statistic RA Percent Paced: 57 %
Brady Statistic RV Percent Paced: 0.72 %
HIGH POWER IMPEDANCE MEASURED VALUE: 82.125
Implantable Lead Implant Date: 20130213
Implantable Lead Implant Date: 20130213
Implantable Lead Location: 753859
Lead Channel Impedance Value: 437.5 Ohm
Lead Channel Impedance Value: 450 Ohm
Lead Channel Pacing Threshold Amplitude: 0.5 V
Lead Channel Pacing Threshold Pulse Width: 0.5 ms
Lead Channel Pacing Threshold Pulse Width: 0.6 ms
Lead Channel Sensing Intrinsic Amplitude: 2.8 mV
Lead Channel Setting Pacing Amplitude: 2.5 V
MDC IDC LEAD LOCATION: 753860
MDC IDC MSMT LEADCHNL RV PACING THRESHOLD AMPLITUDE: 1 V
MDC IDC MSMT LEADCHNL RV SENSING INTR AMPL: 12 mV
MDC IDC PG IMPLANT DT: 20130213
MDC IDC PG SERIAL: 7003419
MDC IDC SESS DTM: 20180503150622
MDC IDC SET LEADCHNL RA PACING AMPLITUDE: 1.5 V
MDC IDC SET LEADCHNL RV PACING PULSEWIDTH: 0.6 ms
MDC IDC SET LEADCHNL RV SENSING SENSITIVITY: 0.5 mV

## 2017-04-14 DIAGNOSIS — R109 Unspecified abdominal pain: Secondary | ICD-10-CM | POA: Diagnosis not present

## 2017-04-14 DIAGNOSIS — J111 Influenza due to unidentified influenza virus with other respiratory manifestations: Secondary | ICD-10-CM | POA: Diagnosis not present

## 2017-05-06 ENCOUNTER — Encounter: Payer: Self-pay | Admitting: Internal Medicine

## 2017-05-06 ENCOUNTER — Ambulatory Visit (INDEPENDENT_AMBULATORY_CARE_PROVIDER_SITE_OTHER): Payer: Managed Care, Other (non HMO) | Admitting: Internal Medicine

## 2017-05-06 VITALS — BP 98/70 | HR 63 | Ht 73.0 in | Wt 202.8 lb

## 2017-05-06 DIAGNOSIS — I255 Ischemic cardiomyopathy: Secondary | ICD-10-CM

## 2017-05-06 DIAGNOSIS — Z95810 Presence of automatic (implantable) cardiac defibrillator: Secondary | ICD-10-CM

## 2017-05-06 DIAGNOSIS — I472 Ventricular tachycardia, unspecified: Secondary | ICD-10-CM

## 2017-05-06 DIAGNOSIS — Z01812 Encounter for preprocedural laboratory examination: Secondary | ICD-10-CM

## 2017-05-06 NOTE — Patient Instructions (Signed)
Medication Instructions:  Please continue all medications as listed.  Labwork: Lab appointment for BMET/CBC on 06/01/17 at the [facility] office  Testing/Procedures: Your physician has recommended that you have an ablation. Catheter ablation is a medical procedure used to treat some cardiac arrhythmias (irregular heartbeats). During catheter ablation, a long, thin, flexible tube is put into a blood vessel in your groin (upper thigh), or neck. This tube is called an ablation catheter. It is then guided to your heart through the blood vessel. Radio frequency waves destroy small areas of heart tissue where abnormal heartbeats may cause an arrhythmia to start. Please see the instruction sheet given to you today.  Follow-Up: Follow-up appointment and 4-6 weeks with Dr. Lovena Le.   Any Other Special Instructions Will Be Listed Below (If Applicable).  HOLD Eliquis 2 days prior to procedure. Last dose will be on 06/07/17.  Please report to the [facility] 06/10/17  at 5:30 AM  Nothing to eat or drink after midnight prior to procedure  Do not take any medication prior to procedure  Plan 1 night stay OR someone to drive you home after the procedure

## 2017-05-06 NOTE — Progress Notes (Signed)
HPI Corey Tyler is referred today by Dr. [name] for consideration of catheter ablation of VT. He is a pleasant 76 yo man with chronic systolic heart failure, ICD, S/P MI, with recurrent VT and ICD shocks and successful episodes of ATP.  The patient has been on amiodarone. He has experienced recurrent VT despite amiodarone. He has not had syncope. He feels dizzy and light headed when he goes into the VT. He has experienced ICD shocks due to unsuccessful VT. No syncope. He does feel palpitations and gets lightheaded.  Allergies  Allergen Reactions  . Xarelto [Rivaroxaban] Other (See Comments)    Bleeding  . Adhesive [Tape] Hives, Itching and Rash  . Atorvastatin Cough  . Codeine     nauseated  . Isosorbide Nitrate Other (See Comments)    Made him feel bad  . Latex Itching and Other (See Comments)    Rash, itching, burning  . Levofloxacin Other (See Comments)    Tachycardia  . Lisinopril Cough  . Lorazepam Other (See Comments)    "felt bad"  . Sertraline Other (See Comments)    Made him feel bad  . Tiotropium Bromide Monohydrate Swelling    Throat swelling     Current Outpatient Prescriptions  Medication Sig Dispense Refill  . albuterol (PROVENTIL) (2.5 MG/3ML) 0.083% nebulizer solution Take 3 mLs (2.5 mg total) by nebulization every 6 (six) hours as needed for wheezing or shortness of breath. 75 mL 4  . Albuterol Sulfate (PROAIR RESPICLICK) 761 (90 Base) MCG/ACT AEPB Inhale 2 puffs into the lungs every 6 (six) hours as needed (shortness of breath/ wheezing).    Marland Kitchen amiodarone (PACERONE) 200 MG tablet Take 1 tablet (200 mg total) by mouth daily. 90 tablet 1  . cholecalciferol (VITAMIN D) 1000 UNITS tablet Take 1,000 Units by mouth daily.    Marland Kitchen ELIQUIS 5 MG TABS tablet TAKE 1 TABLET BY MOUTH TWICE A DAY 60 tablet 5  . finasteride (PROSCAR) 5 MG tablet Take 5 mg by mouth daily.     . Fluticasone-Salmeterol (ADVAIR) 250-50 MCG/DOSE AEPB Inhale 1 puff into the lungs 2 (two) times daily  as needed (shortness of breath, wheezing).    . furosemide (LASIX) 40 MG tablet TAKE ONE TABLET BY MOUTH ONCE DAILY 90 tablet 3  . levothyroxine (SYNTHROID, LEVOTHROID) 112 MCG tablet Take 112 mcg by mouth daily before breakfast.     . loratadine (CLARITIN) 10 MG tablet Take 10 mg by mouth daily.    . Melatonin 5 MG CAPS Take 5 mg by mouth at bedtime.     . Multiple Vitamin (MULTIVITAMIN WITH MINERALS) TABS tablet Take 1 tablet by mouth daily.    . nitroGLYCERIN (NITROSTAT) 0.4 MG SL tablet Place 0.4 mg under the tongue every 5 (five) minutes as needed for chest pain.     . OXYGEN Inhale 2 L into the lungs See admin instructions. Use oxygen whenever resting    . pantoprazole (PROTONIX) 40 MG tablet Take 1 tablet (40 mg total) by mouth daily. 31 tablet 11  . POTASSIUM PO Take 1 tablet by mouth at bedtime.    . pravastatin (PRAVACHOL) 40 MG tablet Take 1 tablet (40 mg total) by mouth every evening. 90 tablet 3  . promethazine (PHENERGAN) 25 MG tablet Take 25 mg by mouth daily as needed for nausea.    . propranolol ER (INDERAL LA) 80 MG 24 hr capsule Take 1 capsule (80 mg total) by mouth at bedtime. 30 capsule 11  .  Psyllium (METAMUCIL PO) Take 15 mLs by mouth daily. Mix in liquid and drink    . sacubitril-valsartan (ENTRESTO) 24-26 MG Take 1 tablet by mouth 2 (two) times daily. 60 tablet 11  . Tamsulosin HCl (FLOMAX) 0.4 MG CAPS Take 0.4 mg by mouth at bedtime.      No current facility-administered medications for this visit.      Past Medical History:  Diagnosis Date  . A-fib ([place])    a. Noted on 12/2012 interrogation, placed on Apixaban and ultimately stopped NOACs 2/2 personal decision 8/14.  . Arthritis   . Atrial tachycardia-non sustained    a. Noted on 03/2012 interrogation.  . Cardiac defibrillator -dual  [employer]   . Chronic systolic heart failure (HCC)    EF down to 20 to 25% per echo November 2012  . COPD (chronic obstructive pulmonary disease) ([place])   . Hyperlipidemia   .  Hypertension   . ICD (implantable cardiac defibrillator) in place   . NICM (nonischemic cardiomyopathy) ([place])    a. Normal cors 2000. b. Minimal plaque 2013.  . Old myocardial infarction    a. ?Silent MI. b. Large fixed inferolateral defect c/w with prior infarct, no ischemia. c. Cath in 2000/2013 with only minimal CAD.  Marland Kitchen Pulmonary nodule, right    Last scan in 2010 showing stability; felt to be benign.  Marland Kitchen PVC's (premature ventricular contractions)   . Ventricular tachycardia, polymorphic ([place])    Rx via ICD 12/14    ROS:   All systems reviewed and negative except as noted in the HPI.   Past Surgical History:  Procedure Laterality Date  . APPENDECTOMY    . CARDIAC CATHETERIZATION  2000  . CARDIAC DEFIBRILLATOR PLACEMENT    . CARDIOVERSION  03/30/2013   Procedure: CARDIOVERSION;  Surgeon: Thayer Headings, MD;  Location: [place];  Service: Cardiovascular;;  . COLON SURGERY    . COLONOSCOPY N/A 09/25/2013   Procedure: COLONOSCOPY;  Surgeon: Jerene Bears, MD;  Location: [facility] ENDOSCOPY;  Service: Endoscopy;  Laterality: N/A;  . ICD  12/2011   [name]  . IMPLANTABLE CARDIOVERTER DEFIBRILLATOR IMPLANT N/A 01/06/2012   Procedure: IMPLANTABLE CARDIOVERTER DEFIBRILLATOR IMPLANT;  Surgeon: Deboraha Sprang, MD;  Location: [facility] CATH LAB;  Service: Cardiovascular;  Laterality: N/A;  . TEE WITHOUT CARDIOVERSION N/A 03/30/2013   Procedure: TRANSESOPHAGEAL ECHOCARDIOGRAM (TEE);  Surgeon: Thayer Headings, MD;  Location: [facility] ENDOSCOPY;  Service: Cardiovascular;  Laterality: N/A;     Family History  Problem Relation Age of Onset  . Emphysema Mother   . Heart disease Mother        AVR  . Heart failure Father        Died agwe 92     Social History   Social History  . Marital status: Married    Spouse name: N/A  . Number of children: 2  . Years of education: N/A   Occupational History  . Retired     Press photographer  . Works [place] History Main Topics  . Smoking status: Former  Smoker    Packs/day: 2.50    Years: 40.00    Types: Cigarettes    Quit date: 11/24/1983  . Smokeless tobacco: Never Used     Comment: 2 1/2 ppd x 40 years  . Alcohol use No  . Drug use: No  . Sexual activity: Yes    Birth control/ protection: None   Other Topics Concern  . Not on file   Social  History Narrative   Still works at [employer] part-time   Lives with Wife in [place]     BP 98/70   Pulse 63   Ht 6\' 1"  (1.854 m)   Wt 202 lb 12.8 oz (92 kg)   SpO2 93%   BMI 26.76 kg/m   Physical Exam:  Well appearing 76 yo man,NAD HEENT: Unremarkable Neck:  6 cm JVD, no thyromegally Lymphatics:  No adenopathy Back:  No CVA tenderness Lungs:  Clear with no wheezes HEART:  Regular rate rhythm, no murmurs, no rubs, no clicks Abd:  soft, positive bowel sounds, no organomegally, no rebound, no guarding Ext:  2 plus pulses, no edema, no cyanosis, no clubbing Skin:  No rashes no nodules Neuro:  CN II through XII intact, motor grossly intact  EKG - NSR   DEVICE  Normal device function.  See PaceArt for details.   Assess/Plan: 1. VT - He has had recurrent VT . He will continue amio. I have discussed the treatment options with the patient and the risks/benefits/goals/expectations of VT ablation with the patient and he wishes to proceed. 2. ICM - he denies anginal symptoms and remains active. 3. Chronic systolic heart failure - He has class 2 symptoms and appears to be on good medical therapy.  Mikle Bosworth.D.

## 2017-05-09 LAB — CUP PACEART INCLINIC DEVICE CHECK
Battery Remaining Longevity: 44 mo
HighPow Impedance: 84.375
Implantable Lead Implant Date: 20130213
Implantable Lead Location: 753859
Implantable Pulse Generator Implant Date: 20130213
Lead Channel Impedance Value: 387.5 Ohm
Lead Channel Impedance Value: 425 Ohm
Lead Channel Setting Pacing Amplitude: 1.5 V
Lead Channel Setting Pacing Amplitude: 2.5 V
Lead Channel Setting Pacing Pulse Width: 0.6 ms
Lead Channel Setting Sensing Sensitivity: 0.5 mV
MDC IDC LEAD IMPLANT DT: 20130213
MDC IDC LEAD LOCATION: 753860
MDC IDC PG SERIAL: 7003419
MDC IDC SESS DTM: 20180614151746
MDC IDC STAT BRADY RA PERCENT PACED: 80 %
MDC IDC STAT BRADY RV PERCENT PACED: 2.4 %

## 2017-05-10 DIAGNOSIS — R972 Elevated prostate specific antigen [PSA]: Secondary | ICD-10-CM | POA: Diagnosis not present

## 2017-05-17 DIAGNOSIS — N400 Enlarged prostate without lower urinary tract symptoms: Secondary | ICD-10-CM | POA: Diagnosis not present

## 2017-05-17 DIAGNOSIS — R972 Elevated prostate specific antigen [PSA]: Secondary | ICD-10-CM | POA: Diagnosis not present

## 2017-05-19 ENCOUNTER — Telehealth: Payer: Self-pay | Admitting: Cardiology

## 2017-05-19 NOTE — Telephone Encounter (Signed)
Spoke w/ pt and requested that he send a manual transmission b/c his home monitor has not updated in at least 7 days.   

## 2017-05-20 ENCOUNTER — Ambulatory Visit (INDEPENDENT_AMBULATORY_CARE_PROVIDER_SITE_OTHER): Payer: Managed Care, Other (non HMO) | Admitting: *Deleted

## 2017-05-20 DIAGNOSIS — I255 Ischemic cardiomyopathy: Secondary | ICD-10-CM

## 2017-05-20 NOTE — Progress Notes (Signed)
Remote ICD transmission.   

## 2017-05-21 ENCOUNTER — Encounter: Payer: Self-pay | Admitting: Cardiology

## 2017-06-01 ENCOUNTER — Other Ambulatory Visit: Payer: Managed Care, Other (non HMO)

## 2017-06-02 ENCOUNTER — Other Ambulatory Visit: Payer: PPO | Admitting: *Deleted

## 2017-06-02 DIAGNOSIS — Z01812 Encounter for preprocedural laboratory examination: Secondary | ICD-10-CM

## 2017-06-02 DIAGNOSIS — I472 Ventricular tachycardia, unspecified: Secondary | ICD-10-CM

## 2017-06-03 LAB — BASIC METABOLIC PANEL
BUN/Creatinine Ratio: 12 (ref 10–24)
BUN: 17 mg/dL (ref 8–27)
CHLORIDE: 99 mmol/L (ref 96–106)
CO2: 24 mmol/L (ref 20–29)
Calcium: 8.9 mg/dL (ref 8.6–10.2)
Creatinine, Ser: 1.42 mg/dL — ABNORMAL HIGH (ref 0.76–1.27)
GFR calc Af Amer: 55 mL/min/{1.73_m2} — ABNORMAL LOW (ref >59)
GFR calc non Af Amer: 47 mL/min/{1.73_m2} — ABNORMAL LOW (ref >59)
Glucose: 91 mg/dL (ref 65–99)
POTASSIUM: 3.9 mmol/L (ref 3.5–5.2)
Sodium: 140 mmol/L (ref 134–144)

## 2017-06-03 LAB — CBC WITH DIFFERENTIAL/PLATELET
BASOS ABS: 0.1 10*3/uL (ref 0.0–0.2)
Basos: 1 %
EOS (ABSOLUTE): 0.3 10*3/uL (ref 0.0–0.4)
Eos: 3 %
Hematocrit: 42.6 % (ref 37.5–51.0)
Hemoglobin: 14.6 g/dL (ref 13.0–17.7)
IMMATURE GRANS (ABS): 0 10*3/uL (ref 0.0–0.1)
IMMATURE GRANULOCYTES: 1 %
LYMPHS: 30 %
Lymphocytes Absolute: 2.3 10*3/uL (ref 0.7–3.1)
MCH: 31.7 pg (ref 26.6–33.0)
MCHC: 34.3 g/dL (ref 31.5–35.7)
MCV: 92 fL (ref 79–97)
Monocytes Absolute: 0.7 10*3/uL (ref 0.1–0.9)
Monocytes: 9 %
NEUTROS PCT: 56 %
Neutrophils Absolute: 4.5 10*3/uL (ref 1.4–7.0)
PLATELETS: 203 10*3/uL (ref 150–379)
RBC: 4.61 x10E6/uL (ref 4.14–5.80)
RDW: 14.8 % (ref 12.3–15.4)
WBC: 7.8 10*3/uL (ref 3.4–10.8)

## 2017-06-04 ENCOUNTER — Encounter: Payer: Self-pay | Admitting: Cardiology

## 2017-06-07 ENCOUNTER — Telehealth: Payer: Self-pay | Admitting: Internal Medicine

## 2017-06-07 NOTE — Telephone Encounter (Signed)
Called, spoke with pt. Answered all pre-op questions. Pt verbalized understanding and thanked me for calling.

## 2017-06-07 NOTE — Telephone Encounter (Signed)
Mr.Mcknight is calling about the ablation procedure he is to have on Thursday . Please call

## 2017-06-08 DIAGNOSIS — I5022 Chronic systolic (congestive) heart failure: Secondary | ICD-10-CM | POA: Diagnosis not present

## 2017-06-08 DIAGNOSIS — I2510 Atherosclerotic heart disease of native coronary artery without angina pectoris: Secondary | ICD-10-CM | POA: Diagnosis not present

## 2017-06-08 DIAGNOSIS — E039 Hypothyroidism, unspecified: Secondary | ICD-10-CM | POA: Diagnosis not present

## 2017-06-10 ENCOUNTER — Encounter (HOSPITAL_COMMUNITY): Payer: Self-pay | Admitting: Anesthesiology

## 2017-06-10 ENCOUNTER — Ambulatory Visit (HOSPITAL_COMMUNITY): Payer: PPO | Admitting: Anesthesiology

## 2017-06-10 ENCOUNTER — Encounter (HOSPITAL_COMMUNITY)
Admission: RE | Disposition: A | Discharge status: Home / Self Care | Payer: Self-pay | Source: Ambulatory Visit | Attending: Internal Medicine

## 2017-06-10 ENCOUNTER — Ambulatory Visit (HOSPITAL_COMMUNITY)
Admission: RE | Admit: 2017-06-10 | Discharge: 2017-06-11 | Disposition: A | Discharge status: Home / Self Care | Payer: PPO | Source: Ambulatory Visit | Attending: Internal Medicine | Admitting: Internal Medicine

## 2017-06-10 DIAGNOSIS — Z95810 Presence of automatic (implantable) cardiac defibrillator: Secondary | ICD-10-CM | POA: Insufficient documentation

## 2017-06-10 DIAGNOSIS — I4510 Unspecified right bundle-branch block: Secondary | ICD-10-CM | POA: Insufficient documentation

## 2017-06-10 DIAGNOSIS — I4891 Unspecified atrial fibrillation: Secondary | ICD-10-CM | POA: Diagnosis not present

## 2017-06-10 DIAGNOSIS — I5022 Chronic systolic (congestive) heart failure: Secondary | ICD-10-CM | POA: Diagnosis not present

## 2017-06-10 DIAGNOSIS — I10 Essential (primary) hypertension: Secondary | ICD-10-CM | POA: Diagnosis not present

## 2017-06-10 DIAGNOSIS — E785 Hyperlipidemia, unspecified: Secondary | ICD-10-CM | POA: Insufficient documentation

## 2017-06-10 DIAGNOSIS — I2510 Atherosclerotic heart disease of native coronary artery without angina pectoris: Secondary | ICD-10-CM | POA: Diagnosis not present

## 2017-06-10 DIAGNOSIS — Z7951 Long term (current) use of inhaled steroids: Secondary | ICD-10-CM | POA: Insufficient documentation

## 2017-06-10 DIAGNOSIS — I252 Old myocardial infarction: Secondary | ICD-10-CM | POA: Diagnosis not present

## 2017-06-10 DIAGNOSIS — Z87891 Personal history of nicotine dependence: Secondary | ICD-10-CM | POA: Insufficient documentation

## 2017-06-10 DIAGNOSIS — Z7901 Long term (current) use of anticoagulants: Secondary | ICD-10-CM | POA: Diagnosis not present

## 2017-06-10 DIAGNOSIS — I472 Ventricular tachycardia, unspecified: Secondary | ICD-10-CM

## 2017-06-10 DIAGNOSIS — J449 Chronic obstructive pulmonary disease, unspecified: Secondary | ICD-10-CM | POA: Diagnosis not present

## 2017-06-10 DIAGNOSIS — M1990 Unspecified osteoarthritis, unspecified site: Secondary | ICD-10-CM | POA: Insufficient documentation

## 2017-06-10 DIAGNOSIS — Z428 Encounter for other plastic and reconstructive surgery following medical procedure or healed injury: Secondary | ICD-10-CM | POA: Diagnosis not present

## 2017-06-10 DIAGNOSIS — I429 Cardiomyopathy, unspecified: Secondary | ICD-10-CM | POA: Diagnosis not present

## 2017-06-10 DIAGNOSIS — I110 Hypertensive heart disease with heart failure: Secondary | ICD-10-CM | POA: Diagnosis not present

## 2017-06-10 DIAGNOSIS — I493 Ventricular premature depolarization: Secondary | ICD-10-CM | POA: Insufficient documentation

## 2017-06-10 HISTORY — PX: V TACH ABLATION: EP1227

## 2017-06-10 HISTORY — DX: Presence of automatic (implantable) cardiac defibrillator: Z95.810

## 2017-06-10 LAB — POCT ACTIVATED CLOTTING TIME
ACTIVATED CLOTTING TIME: 219 s
ACTIVATED CLOTTING TIME: 191 s
ACTIVATED CLOTTING TIME: 175 s
Activated Clotting Time: 191 seconds
Activated Clotting Time: 213 seconds
Activated Clotting Time: 241 seconds
Activated Clotting Time: 208 seconds

## 2017-06-10 SURGERY — V TACH ABLATION
Anesthesia: General

## 2017-06-10 MED ORDER — DEXMEDETOMIDINE HCL IN NACL 400 MCG/100ML IV SOLN
0.4000 ug/kg/h | INTRAVENOUS | Status: AC
  Administered 2017-06-10: .5 ug/kg/h via INTRAVENOUS
  Filled 2017-06-10: qty 100

## 2017-06-10 MED ORDER — SODIUM CHLORIDE 0.9% FLUSH
3.0000 mL | Freq: Two times a day (BID) | INTRAVENOUS | Status: DC
  Administered 2017-06-10: 3 mL via INTRAVENOUS

## 2017-06-10 MED ORDER — ADULT MULTIVITAMIN W/MINERALS CH: 1.0000 | ORAL_TABLET | Freq: Every day | ORAL | Status: DC

## 2017-06-10 MED ORDER — BUPIVACAINE HCL (PF) 0.25 % IJ SOLN
INTRAMUSCULAR | Status: AC
  Filled 2017-06-10: qty 30

## 2017-06-10 MED ORDER — ALBUTEROL SULFATE 108 (90 BASE) MCG/ACT IN AEPB: 2.0000 | INHALATION_SPRAY | Freq: Four times a day (QID) | RESPIRATORY_TRACT | Status: DC | PRN

## 2017-06-10 MED ORDER — SODIUM CHLORIDE 0.9% FLUSH: 3.0000 mL | INTRAVENOUS | Status: DC | PRN

## 2017-06-10 MED ORDER — HEPARIN (PORCINE) IN NACL 2-0.9 UNIT/ML-% IJ SOLN
INTRAMUSCULAR | Status: AC | PRN
  Administered 2017-06-10: 1000 mL

## 2017-06-10 MED ORDER — HEPARIN (PORCINE) IN NACL 2-0.9 UNIT/ML-% IJ SOLN
INTRAMUSCULAR | Status: AC
  Filled 2017-06-10: qty 500

## 2017-06-10 MED ORDER — PRAVASTATIN SODIUM 40 MG PO TABS
40.0000 mg | ORAL_TABLET | Freq: Every evening | ORAL | Status: DC
  Filled 2017-06-10: qty 1

## 2017-06-10 MED ORDER — PROPRANOLOL HCL ER 80 MG PO CP24
80.0000 mg | ORAL_CAPSULE | Freq: Every day | ORAL | Status: DC
  Administered 2017-06-10: 80 mg via ORAL
  Filled 2017-06-10: qty 1

## 2017-06-10 MED ORDER — MELATONIN 3 MG PO TABS
3.0000 mg | ORAL_TABLET | Freq: Every day | ORAL | Status: DC
  Administered 2017-06-10: 3 mg via ORAL
  Filled 2017-06-10: qty 1

## 2017-06-10 MED ORDER — FINASTERIDE 5 MG PO TABS
5.0000 mg | ORAL_TABLET | Freq: Every day | ORAL | Status: DC
  Administered 2017-06-10: 5 mg via ORAL
  Filled 2017-06-10: qty 1

## 2017-06-10 MED ORDER — SACUBITRIL-VALSARTAN 24-26 MG PO TABS
1.0000 | ORAL_TABLET | Freq: Two times a day (BID) | ORAL | Status: DC
  Administered 2017-06-10: 1 via ORAL
  Filled 2017-06-10 (×2): qty 1

## 2017-06-10 MED ORDER — ONDANSETRON HCL 4 MG/2ML IJ SOLN: 4.0000 mg | Freq: Four times a day (QID) | INTRAMUSCULAR | Status: DC | PRN

## 2017-06-10 MED ORDER — APIXABAN 5 MG PO TABS
5.0000 mg | ORAL_TABLET | Freq: Two times a day (BID) | ORAL | Status: DC
  Administered 2017-06-10: 5 mg via ORAL
  Filled 2017-06-10: qty 1

## 2017-06-10 MED ORDER — DEXMEDETOMIDINE HCL 200 MCG/2ML IV SOLN
INTRAVENOUS | Status: DC | PRN
  Administered 2017-06-10: 20 ug via INTRAVENOUS
  Administered 2017-06-10: 12 ug via INTRAVENOUS
  Administered 2017-06-10: 20 ug via INTRAVENOUS
  Administered 2017-06-10: 8 ug via INTRAVENOUS
  Administered 2017-06-10: 20 ug via INTRAVENOUS

## 2017-06-10 MED ORDER — FENTANYL CITRATE (PF) 100 MCG/2ML IJ SOLN
INTRAMUSCULAR | Status: DC | PRN
  Administered 2017-06-10 (×3): 50 ug via INTRAVENOUS

## 2017-06-10 MED ORDER — ACETAMINOPHEN 325 MG PO TABS: 650.0000 mg | ORAL_TABLET | ORAL | Status: DC | PRN

## 2017-06-10 MED ORDER — VITAMIN D3 25 MCG (1000 UNIT) PO TABS
1000.0000 [IU] | ORAL_TABLET | Freq: Every day | ORAL | Status: DC
  Filled 2017-06-10: qty 1

## 2017-06-10 MED ORDER — ALBUTEROL SULFATE (2.5 MG/3ML) 0.083% IN NEBU: 2.5000 mg | INHALATION_SOLUTION | Freq: Four times a day (QID) | RESPIRATORY_TRACT | Status: DC | PRN

## 2017-06-10 MED ORDER — BUPIVACAINE HCL (PF) 0.25 % IJ SOLN
INTRAMUSCULAR | Status: DC | PRN
  Administered 2017-06-10: 40 mL

## 2017-06-10 MED ORDER — HEPARIN SODIUM (PORCINE) 1000 UNIT/ML IJ SOLN
INTRAMUSCULAR | Status: AC
  Filled 2017-06-10: qty 1

## 2017-06-10 MED ORDER — LEVOTHYROXINE SODIUM 112 MCG PO TABS: 112.0000 ug | ORAL_TABLET | Freq: Every day | ORAL | Status: DC

## 2017-06-10 MED ORDER — AMIODARONE HCL 200 MG PO TABS
200.0000 mg | ORAL_TABLET | Freq: Every day | ORAL | Status: DC
  Administered 2017-06-10: 200 mg via ORAL
  Filled 2017-06-10: qty 1

## 2017-06-10 MED ORDER — NITROGLYCERIN 0.4 MG SL SUBL: 0.4000 mg | SUBLINGUAL_TABLET | SUBLINGUAL | Status: DC | PRN

## 2017-06-10 MED ORDER — HEPARIN SODIUM (PORCINE) 1000 UNIT/ML IJ SOLN
INTRAMUSCULAR | Status: DC | PRN
  Administered 2017-06-10: 1000 [IU] via INTRAVENOUS

## 2017-06-10 MED ORDER — FUROSEMIDE 40 MG PO TABS
40.0000 mg | ORAL_TABLET | Freq: Every day | ORAL | Status: DC
  Administered 2017-06-10: 40 mg via ORAL
  Filled 2017-06-10: qty 1

## 2017-06-10 MED ORDER — PROMETHAZINE HCL 25 MG PO TABS: 25.0000 mg | ORAL_TABLET | Freq: Every day | ORAL | Status: DC | PRN

## 2017-06-10 MED ORDER — HEPARIN SODIUM (PORCINE) 1000 UNIT/ML IJ SOLN
INTRAMUSCULAR | Status: DC | PRN
Start: 2017-06-10 — End: 2017-06-10
  Administered 2017-06-10 (×2): 3000 [IU] via INTRAVENOUS
  Administered 2017-06-10 (×2): 2000 [IU] via INTRAVENOUS
  Administered 2017-06-10: 7000 [IU] via INTRAVENOUS

## 2017-06-10 MED ORDER — LORATADINE 10 MG PO TABS: 10.0000 mg | ORAL_TABLET | Freq: Every day | ORAL | Status: DC

## 2017-06-10 MED ORDER — SODIUM CHLORIDE 0.9 % IV SOLN
INTRAVENOUS | Status: DC
Start: 2017-06-10 — End: 2017-06-10
  Administered 2017-06-10 (×2): via INTRAVENOUS

## 2017-06-10 MED ORDER — PANTOPRAZOLE SODIUM 40 MG PO TBEC
40.0000 mg | DELAYED_RELEASE_TABLET | Freq: Every day | ORAL | Status: DC
Start: 2017-06-11 — End: 2017-06-11

## 2017-06-10 MED ORDER — SODIUM CHLORIDE 0.9 % IV SOLN: 250.0000 mL | INTRAVENOUS | Status: DC | PRN

## 2017-06-10 MED ORDER — TAMSULOSIN HCL 0.4 MG PO CAPS
0.4000 mg | ORAL_CAPSULE | Freq: Every day | ORAL | Status: DC
  Administered 2017-06-10: 0.4 mg via ORAL
  Filled 2017-06-10: qty 1

## 2017-06-10 SURGICAL SUPPLY — 14 items
BAG SNAP BAND KOVER 36X36 (MISCELLANEOUS) ×2 IMPLANT
CATH JOSEPHSON QUAD-ALLRED 6FR (CATHETERS) ×4 IMPLANT
CATH SMTCH THERMOCOOL SF DF (CATHETERS) ×2 IMPLANT
CATH WEBSTER BI DIR CS D-F CRV (CATHETERS) ×2 IMPLANT
HOVERMATT SINGLE USE (MISCELLANEOUS) ×2 IMPLANT
PACK EP LATEX FREE (CUSTOM PROCEDURE TRAY) ×3
PACK EP LF (CUSTOM PROCEDURE TRAY) IMPLANT
PAD DEFIB LIFELINK (PAD) ×2 IMPLANT
PATCH CARTO3 (PAD) ×2 IMPLANT
SHEATH PINNACLE 6F 10CM (SHEATH) ×4 IMPLANT
SHEATH PINNACLE 7F 10CM (SHEATH) ×2 IMPLANT
SHEATH PINNACLE 8F 10CM (SHEATH) ×2 IMPLANT
SHIELD RADPAD SCOOP 12X17 (MISCELLANEOUS) ×2 IMPLANT
TUBING SMART ABLATE COOLFLOW (TUBING) ×2 IMPLANT

## 2017-06-10 NOTE — Discharge Summary (Signed)
ELECTROPHYSIOLOGY PROCEDURE DISCHARGE SUMMARY    Patient ID: Corey Tyler,  MRN: [id number], DOB/AGE: [id number] y.o.  Admit date: 06/10/2017 Discharge date: 06/11/17  Primary Care Physician: [name], MD  Primary Cardiologist: Dr. Percival Spanish Electrophysiologist: Dr. [name]  Primary Discharge Diagnosis:  1. VT  Secondary Discharge Diagnosis:  1. Chronic CHF (systolic) 2. PAFib     CHA2DS2Vasc is 5, on Eliquis (appropriately dosed at 5mg  BID) 3. NCM 4. HTN 5. CAD 6. COPD  Allergies  Allergen Reactions  . Xarelto [Rivaroxaban] Other (See Comments)    Bleeding  . Adhesive [Tape] Hives, Itching and Rash  . Atorvastatin Cough  . Codeine     nauseated  . Isosorbide Nitrate Other (See Comments)    Made him feel bad  . Latex Itching and Other (See Comments)    Rash, itching, burning  . Levofloxacin Other (See Comments)    Tachycardia  . Lisinopril Cough  . Lorazepam Other (See Comments)    "felt bad"  . Sertraline Other (See Comments)    Made him feel bad  . Tiotropium Bromide Monohydrate Swelling    Throat swelling     Procedures This Admission: 1.  Electrophysiology study and radiofrequency catheter ablation on 06/09/17 by Dr Lovena Le.   This study demonstrated  Conclusion: Successful EP study and catheter ablation of the patient's clinical ventricular tachycardia utilizing 3-dimensional electro anatomic mapping. There is a paucity of scar tissue on the endocardium of the left ventricle. A second VT was induced which was hemodynamically unstable and could not be mapped. There is no clearcut scar tissue associated with the endocardial surface of the left ventricle from which this VT originated.  Brief HPI: Corey Tyler is a 76 y.o. male with a past medical history as outlined above with recurrent VT treated by his ICD.  Risks, benefits, and alternatives to ablation were reviewed with the patient who wished to proceed.   Hospital Course:  The patient was  admitted and underwent EPS/RFCA with details as outlined above.He was monitored on telemetry overnight which demonstrated SR.  Right groin site is stable without complication.  The patient was examined by Dr. Lovena Le and considered stable for discharge to home.  Follow up whas been arranged.  Wound care and restrictions were reviewed with the patient prior to discharge. No changes to home medications, he is reminded of driving restriction.  Physical Exam: Vitals:   06/10/17 1759 06/10/17 2008 06/10/17 2100 06/11/17 0500  BP: 125/73 123/71 133/71 110/63  Pulse:  62  64  Resp:  16  16  Temp:  98.2 F (36.8 C)  98.4 F (36.9 C)  TempSrc:  Oral  Oral  SpO2: 94% 94% 95% 92%  Weight:      Height:        GEN- The patient is well appearing, alert and oriented x 3 today.   HEENT: normocephalic, atraumatic; sclera clear, conjunctiva pink; hearing intact; oropharynx clear; neck supple, no JVP Lymph- no cervical lymphadenopathy Lungs-CTA b/l, normal work of breathing.  No wheezes, rales, rhonchi Heart-  RRR, no murmurs, rubs or gallops, PMI not laterally displaced GI- soft, non-tender, non-distended, Extremities- no clubbing, cyanosis, or edema; DP/PT/radial pulses 2+ bilaterally, right groin without hematoma/bruit MS- no significant deformity or atrophy Skin- warm and dry, no rash or lesion Psych- euthymic mood, full affect Neuro- strength and sensation are intact   Discharge Vitals: BP 110/63 (BP Location: Left Arm)   Pulse 64   Temp 98.4 F (36.9  C) (Oral)   Resp 16   Ht 6\' 2"  (1.88 m)   Wt 202 lb (91.6 kg)   SpO2 92%   BMI 25.94 kg/m    Labs:   Lab Results  Component Value Date   WBC 7.8 06/02/2017   HGB 14.6 06/02/2017   HCT 42.6 06/02/2017   MCV 92 06/02/2017   PLT 203 06/02/2017   No results for input(s): NA, K, CL, CO2, BUN, CREATININE, CALCIUM, PROT, BILITOT, ALKPHOS, ALT, AST, GLUCOSE in the last 168 hours.  Invalid input(s): LABALBU  Discharge Medications:    Allergies as of 06/11/2017      Reactions   Xarelto [rivaroxaban] Other (See Comments)   Bleeding   Adhesive [tape] Hives, Itching, Rash   Atorvastatin Cough   Codeine    nauseated   Isosorbide Nitrate Other (See Comments)   Made him feel bad   Latex Itching, Other (See Comments)   Rash, itching, burning   Levofloxacin Other (See Comments)   Tachycardia   Lisinopril Cough   Lorazepam Other (See Comments)   "felt bad"   Sertraline Other (See Comments)   Made him feel bad   Tiotropium Bromide Monohydrate Swelling   Throat swelling      Medication List    TAKE these medications   PROAIR RESPICLICK 195 (90 Base) MCG/ACT Aepb Generic drug:  Albuterol Sulfate Inhale 2 puffs into the lungs every 6 (six) hours as needed (shortness of breath/ wheezing).   albuterol (2.5 MG/3ML) 0.083% nebulizer solution Commonly known as:  PROVENTIL Take 3 mLs (2.5 mg total) by nebulization every 6 (six) hours as needed for wheezing or shortness of breath.   amiodarone 200 MG tablet Commonly known as:  PACERONE Take 1 tablet (200 mg total) by mouth daily.   cholecalciferol 1000 units tablet Commonly known as:  VITAMIN D Take 1,000 Units by mouth daily.   ELIQUIS 5 MG Tabs tablet Generic drug:  apixaban TAKE 1 TABLET BY MOUTH TWICE A DAY   finasteride 5 MG tablet Commonly known as:  PROSCAR Take 5 mg by mouth daily.   FLOMAX 0.4 MG Caps capsule Generic drug:  tamsulosin Take 0.4 mg by mouth at bedtime.   Fluticasone-Salmeterol 250-50 MCG/DOSE Aepb Commonly known as:  ADVAIR Inhale 1 puff into the lungs 2 (two) times daily.   furosemide 40 MG tablet Commonly known as:  LASIX TAKE ONE TABLET BY MOUTH ONCE DAILY   levothyroxine 112 MCG tablet Commonly known as:  SYNTHROID, LEVOTHROID Take 112 mcg by mouth daily before breakfast.   loratadine 10 MG tablet Commonly known as:  CLARITIN Take 10 mg by mouth daily.   LUBRICATING EYE DROPS OP Apply 1 drop to eye daily as needed  (dry eyes).   Melatonin 5 MG Caps Take 5 mg by mouth at bedtime.   METAMUCIL PO Take 1 Dose by mouth daily. Mix in liquid and drink   multivitamin with minerals Tabs tablet Take 1 tablet by mouth daily.   nitroGLYCERIN 0.4 MG SL tablet Commonly known as:  NITROSTAT Place 0.4 mg under the tongue every 5 (five) minutes as needed for chest pain.   OXYGEN Inhale 2 L into the lungs See admin instructions. Use oxygen whenever resting   pantoprazole 40 MG tablet Commonly known as:  PROTONIX Take 1 tablet (40 mg total) by mouth daily.   POTASSIUM PO Take 1 tablet by mouth at bedtime.   pravastatin 40 MG tablet Commonly known as:  PRAVACHOL Take 1 tablet (40 mg  total) by mouth every evening.   promethazine 25 MG tablet Commonly known as:  PHENERGAN Take 25 mg by mouth daily as needed for nausea or vomiting.   propranolol ER 80 MG 24 hr capsule Commonly known as:  INDERAL LA Take 1 capsule (80 mg total) by mouth at bedtime.   sacubitril-valsartan 24-26 MG Commonly known as:  ENTRESTO Take 1 tablet by mouth 2 (two) times daily.       Disposition: Home Discharge Instructions    Diet - low sodium heart healthy    Complete by:  As directed    Increase activity slowly    Complete by:  As directed      Follow-up Information    Evans Lance, MD Follow up on 07/13/2017.   Specialty:  Cardiology Why:  12:15PM Contact information: [address] [address] [phone]           Duration of Discharge Encounter: Greater than 30 minutes including physician time.  Venetia Night, PA-C 06/11/2017 9:10 AM   EP Attending  Patient is doing well this morning. He has a small amount of ecchymosis over the right femoral region. Tele quiet overnight. No chest pain. Vitals are stable. Exam without evidence of volume overload. DC home on preop meds.  Mikle Bosworth.D.

## 2017-06-10 NOTE — Discharge Instructions (Signed)
No driving for 6 months. No lifting over 5 lbs for 1 week. No vigorous or sexual activity for 1 week. You may return to work on 06/17/17. Keep procedure site clean & dry. If you notice increased pain, swelling, bleeding or pus, call/return!  You may shower, but no soaking baths/hot tubs/pools for 1 week.

## 2017-06-10 NOTE — Plan of Care (Signed)
Problem: Safety: Goal: Ability to remain free from injury will improve Outcome: Progressing Pt uses call light appropriately for assistance out of bed.  Ambulates independently with standby assist without difficulty.  Problem: Pain Managment: Goal: General experience of comfort will improve Outcome: Progressing Pt denies c/o pain or discomfort at this time.  Problem: Physical Regulation: Goal: Ability to maintain clinical measurements within normal limits will improve Outcome: Progressing VSS

## 2017-06-10 NOTE — Progress Notes (Signed)
Site area: rt groin fa and fv x3  sheaths Site Prior to Removal:  Level 1 Pressure Applied For: 45 minutes Manual:   yes Patient Status During Pull:  stable Post Pull Site:  Level 1 bruise Post Pull Instructions Given:  yes Post Pull Pulses Present: palpable Dressing Applied:  Gauze and tegaderm Bedrest begins @ 1440 Comments: IV saline locked

## 2017-06-10 NOTE — Anesthesia Postprocedure Evaluation (Signed)
Anesthesia Post Note  Patient: BURHANUDDIN KOHLMANN  Procedure(s) Performed: Procedure(s) (LRB): V Tach Ablation (N/A)     Patient location during evaluation: Cath Lab Anesthesia Type: MAC Level of consciousness: awake, awake and alert and oriented Pain management: pain level controlled Vital Signs Assessment: post-procedure vital signs reviewed and stable Cardiovascular status: blood pressure returned to baseline Postop Assessment: no headache Anesthetic complications: no    Last Vitals:  Vitals:   06/10/17 1544 06/10/17 1600  BP: 120/73 120/73  Pulse:    Resp:    Temp:      Last Pain:  Vitals:   06/10/17 1500  TempSrc: Axillary                 Travoris Bushey COKER

## 2017-06-10 NOTE — Transfer of Care (Signed)
Immediate Anesthesia Transfer of Care Note  Patient: Corey Tyler  Procedure(s) Performed: Procedure(s): V Tach Ablation (N/A)  Patient Location: PACU and Cath Lab  Anesthesia Type:MAC  Level of Consciousness: awake, alert , oriented and patient cooperative  Airway & Oxygen Therapy: Patient Spontanous Breathing and Patient connected to nasal cannula oxygen  Post-op Assessment: Report given to RN, Post -op Vital signs reviewed and stable and Patient moving all extremities  Post vital signs: Reviewed and stable  Last Vitals:  Vitals:   06/10/17 0544  BP: (!) 145/79  Pulse: (!) 59  Temp: 36.5 C    Last Pain:  Vitals:   06/10/17 0544  TempSrc: Oral         Complications: No apparent anesthesia complications

## 2017-06-10 NOTE — H&P (Signed)
HPI Corey Tyler is referred today by Dr. [name] for consideration of catheter ablation of VT. He is a pleasant 76 yo man with chronic systolic heart failure, ICD, S/P MI, with recurrent VT and ICD shocks and successful episodes of ATP.  The patient has been on amiodarone. He has experienced recurrent VT despite amiodarone. He has not had syncope. He feels dizzy and light headed when he goes into the VT. He has experienced ICD shocks due to unsuccessful VT. No syncope. He does feel palpitations and gets lightheaded.       Allergies  Allergen Reactions  . Xarelto [Rivaroxaban] Other (See Comments)    Bleeding  . Adhesive [Tape] Hives, Itching and Rash  . Atorvastatin Cough  . Codeine     nauseated  . Isosorbide Nitrate Other (See Comments)    Made him feel bad  . Latex Itching and Other (See Comments)    Rash, itching, burning  . Levofloxacin Other (See Comments)    Tachycardia  . Lisinopril Cough  . Lorazepam Other (See Comments)    "felt bad"  . Sertraline Other (See Comments)    Made him feel bad  . Tiotropium Bromide Monohydrate Swelling    Throat swelling           Current Outpatient Prescriptions  Medication Sig Dispense Refill  . albuterol (PROVENTIL) (2.5 MG/3ML) 0.083% nebulizer solution Take 3 mLs (2.5 mg total) by nebulization every 6 (six) hours as needed for wheezing or shortness of breath. 75 mL 4  . Albuterol Sulfate (PROAIR RESPICLICK) 735 (90 Base) MCG/ACT AEPB Inhale 2 puffs into the lungs every 6 (six) hours as needed (shortness of breath/ wheezing).    Marland Kitchen amiodarone (PACERONE) 200 MG tablet Take 1 tablet (200 mg total) by mouth daily. 90 tablet 1  . cholecalciferol (VITAMIN D) 1000 UNITS tablet Take 1,000 Units by mouth daily.    Marland Kitchen ELIQUIS 5 MG TABS tablet TAKE 1 TABLET BY MOUTH TWICE A DAY 60 tablet 5  . finasteride (PROSCAR) 5 MG tablet Take 5 mg by mouth daily.     . Fluticasone-Salmeterol (ADVAIR) 250-50 MCG/DOSE AEPB  Inhale 1 puff into the lungs 2 (two) times daily as needed (shortness of breath, wheezing).    . furosemide (LASIX) 40 MG tablet TAKE ONE TABLET BY MOUTH ONCE DAILY 90 tablet 3  . levothyroxine (SYNTHROID, LEVOTHROID) 112 MCG tablet Take 112 mcg by mouth daily before breakfast.     . loratadine (CLARITIN) 10 MG tablet Take 10 mg by mouth daily.    . Melatonin 5 MG CAPS Take 5 mg by mouth at bedtime.     . Multiple Vitamin (MULTIVITAMIN WITH MINERALS) TABS tablet Take 1 tablet by mouth daily.    . nitroGLYCERIN (NITROSTAT) 0.4 MG SL tablet Place 0.4 mg under the tongue every 5 (five) minutes as needed for chest pain.     . OXYGEN Inhale 2 L into the lungs See admin instructions. Use oxygen whenever resting    . pantoprazole (PROTONIX) 40 MG tablet Take 1 tablet (40 mg total) by mouth daily. 31 tablet 11  . POTASSIUM PO Take 1 tablet by mouth at bedtime.    . pravastatin (PRAVACHOL) 40 MG tablet Take 1 tablet (40 mg total) by mouth every evening. 90 tablet 3  . promethazine (PHENERGAN) 25 MG tablet Take 25 mg by mouth daily as needed for nausea.    . propranolol ER (INDERAL LA) 80 MG 24 hr capsule Take 1  capsule (80 mg total) by mouth at bedtime. 30 capsule 11  . Psyllium (METAMUCIL PO) Take 15 mLs by mouth daily. Mix in liquid and drink    . sacubitril-valsartan (ENTRESTO) 24-26 MG Take 1 tablet by mouth 2 (two) times daily. 60 tablet 11  . Tamsulosin HCl (FLOMAX) 0.4 MG CAPS Take 0.4 mg by mouth at bedtime.      No current facility-administered medications for this visit.          Past Medical History:  Diagnosis Date  . A-fib ([place])    a. Noted on 12/2012 interrogation, placed on Apixaban and ultimately stopped NOACs 2/2 personal decision 8/14.  . Arthritis   . Atrial tachycardia-non sustained    a. Noted on 03/2012 interrogation.  . Cardiac defibrillator -dual  [facility]   . Chronic systolic heart failure ([facility])    EF down to 20 to 25% per echo November  2012  . COPD (chronic obstructive pulmonary disease) ([place])   . Hyperlipidemia   . Hypertension   . ICD (implantable cardiac defibrillator) in place   . NICM (nonischemic cardiomyopathy) ([place])    a. Normal cors 2000. b. Minimal plaque 2013.  . Old myocardial infarction    a. ?Silent MI. b. Large fixed inferolateral defect c/w with prior infarct, no ischemia. c. Cath in 2000/2013 with only minimal CAD.  Marland Kitchen Pulmonary nodule, right    Last scan in 2010 showing stability; felt to be benign.  Marland Kitchen PVC's (premature ventricular contractions)   . Ventricular tachycardia, polymorphic ([place])    Rx via ICD 12/14    ROS:   All systems reviewed and negative except as noted in the HPI.        Past Surgical History:  Procedure Laterality Date  . APPENDECTOMY    . CARDIAC CATHETERIZATION  2000  . CARDIAC DEFIBRILLATOR PLACEMENT    . CARDIOVERSION  03/30/2013   Procedure: CARDIOVERSION;  Surgeon: Thayer Headings, MD;  Location: [facility];  Service: Cardiovascular;;  . COLON SURGERY    . COLONOSCOPY N/A 09/25/2013   Procedure: COLONOSCOPY;  Surgeon: Jerene Bears, MD;  Location: [facility] ENDOSCOPY;  Service: Endoscopy;  Laterality: N/A;  . ICD  12/2011   [name]  . IMPLANTABLE CARDIOVERTER DEFIBRILLATOR IMPLANT N/A 01/06/2012   Procedure: IMPLANTABLE CARDIOVERTER DEFIBRILLATOR IMPLANT;  Surgeon: Deboraha Sprang, MD;  Location: [facility] CATH LAB;  Service: Cardiovascular;  Laterality: N/A;  . TEE WITHOUT CARDIOVERSION N/A 03/30/2013   Procedure: TRANSESOPHAGEAL ECHOCARDIOGRAM (TEE);  Surgeon: Thayer Headings, MD;  Location: [facility] ENDOSCOPY;  Service: Cardiovascular;  Laterality: N/A;          Family History  Problem Relation Age of Onset  . Emphysema Mother   . Heart disease Mother        AVR  . Heart failure Father        Died agwe 28     Social History        Social History  . Marital status: Married    Spouse name: N/A  . Number of children: 2  . Years of  education: N/A        Occupational History  . Retired     Press photographer  . Works [employer] History Main Topics  . Smoking status: Former Smoker    Packs/day: 2.50    Years: 40.00    Types: Cigarettes    Quit date: 11/24/1983  . Smokeless tobacco: Never Used  Comment: 2 1/2 ppd x 40 years  . Alcohol use No  . Drug use: No  . Sexual activity: Yes    Birth control/ protection: None       Other Topics Concern  . Not on file      Social History Narrative   Still works at [employer] part-time   Lives with Wife in [place]     BP 98/70   Pulse 63   Ht 6\' 1"  (1.854 m)   Wt 202 lb 12.8 oz (92 kg)   SpO2 93%   BMI 26.76 kg/m   Physical Exam:  Well appearing 76 yo man,NAD HEENT: Unremarkable Neck:  6 cm JVD, no thyromegally Lymphatics:  No adenopathy Back:  No CVA tenderness Lungs:  Clear with no wheezes HEART:  Regular rate rhythm, no murmurs, no rubs, no clicks Abd:  soft, positive bowel sounds, no organomegally, no rebound, no guarding Ext:  2 plus pulses, no edema, no cyanosis, no clubbing Skin:  No rashes no nodules Neuro:  CN II through XII intact, motor grossly intact  EKG - NSR   DEVICE  Normal device function.  See PaceArt for details.   Assess/Plan: 1. VT - He has had recurrent VT . He will continue amio. I have discussed the treatment options with the patient and the risks/benefits/goals/expectations of VT ablation with the patient and he wishes to proceed. 2. ICM - he denies anginal symptoms and remains active. 3. Chronic systolic heart failure - He has class 2 symptoms and appears to be on good medical therapy.  Ponciano Ort.  EP Attending  Patient seen and examined. Since his last visit, no change in the history, exam, and plan. For VT ablation today. I have discussed the risks/benefits/goals/expectations of the procedure and he wishes to proceed.  Mikle Bosworth.D.

## 2017-06-10 NOTE — Anesthesia Preprocedure Evaluation (Addendum)
Anesthesia Evaluation  Patient identified by MRN, date of birth, ID band Patient awake    Reviewed: Allergy & Precautions, NPO status , Patient's Chart, lab work & pertinent test results  Airway Mallampati: II  TM Distance: >3 FB Neck ROM: Full    Dental  (+) Edentulous Upper, Partial Lower   Pulmonary former smoker,    breath sounds clear to auscultation       Cardiovascular hypertension,  Rhythm:Regular Rate:Normal     Neuro/Psych    GI/Hepatic   Endo/Other    Renal/GU      Musculoskeletal   Abdominal   Peds  Hematology   Anesthesia Other Findings   Reproductive/Obstetrics                            Anesthesia Physical Anesthesia Plan  ASA: III  Anesthesia Plan: General   Post-op Pain Management:    Induction: Intravenous  PONV Risk Score and Plan: Ondansetron and Dexamethasone  Airway Management Planned: LMA  Additional Equipment:   Intra-op Plan:   Post-operative Plan:   Informed Consent: I have reviewed the patients History and Physical, chart, labs and discussed the procedure including the risks, benefits and alternatives for the proposed anesthesia with the patient or authorized representative who has indicated his/her understanding and acceptance.     Plan Discussed with: CRNA and Anesthesiologist  Anesthesia Plan Comments:        Anesthesia Quick Evaluation

## 2017-06-11 ENCOUNTER — Encounter (HOSPITAL_COMMUNITY): Payer: Self-pay | Admitting: Internal Medicine

## 2017-06-11 DIAGNOSIS — I4891 Unspecified atrial fibrillation: Secondary | ICD-10-CM | POA: Diagnosis not present

## 2017-06-11 DIAGNOSIS — I2510 Atherosclerotic heart disease of native coronary artery without angina pectoris: Secondary | ICD-10-CM | POA: Diagnosis not present

## 2017-06-11 DIAGNOSIS — M1990 Unspecified osteoarthritis, unspecified site: Secondary | ICD-10-CM | POA: Diagnosis not present

## 2017-06-11 DIAGNOSIS — I493 Ventricular premature depolarization: Secondary | ICD-10-CM | POA: Diagnosis not present

## 2017-06-11 DIAGNOSIS — E785 Hyperlipidemia, unspecified: Secondary | ICD-10-CM | POA: Diagnosis not present

## 2017-06-11 DIAGNOSIS — I252 Old myocardial infarction: Secondary | ICD-10-CM | POA: Diagnosis not present

## 2017-06-11 DIAGNOSIS — I472 Ventricular tachycardia: Secondary | ICD-10-CM | POA: Diagnosis not present

## 2017-06-11 DIAGNOSIS — Z7901 Long term (current) use of anticoagulants: Secondary | ICD-10-CM | POA: Diagnosis not present

## 2017-06-11 DIAGNOSIS — J449 Chronic obstructive pulmonary disease, unspecified: Secondary | ICD-10-CM | POA: Diagnosis not present

## 2017-06-11 DIAGNOSIS — I110 Hypertensive heart disease with heart failure: Secondary | ICD-10-CM | POA: Diagnosis not present

## 2017-06-11 DIAGNOSIS — I4510 Unspecified right bundle-branch block: Secondary | ICD-10-CM | POA: Diagnosis not present

## 2017-06-11 DIAGNOSIS — I5022 Chronic systolic (congestive) heart failure: Secondary | ICD-10-CM | POA: Diagnosis not present

## 2017-06-15 ENCOUNTER — Telehealth: Payer: Self-pay

## 2017-06-15 DIAGNOSIS — E039 Hypothyroidism, unspecified: Secondary | ICD-10-CM | POA: Diagnosis not present

## 2017-06-15 DIAGNOSIS — I10 Essential (primary) hypertension: Secondary | ICD-10-CM | POA: Diagnosis not present

## 2017-06-15 NOTE — Telephone Encounter (Signed)
Spoke with pt regarding ATP episode~ pt does not recall any thing specific from 7/23 at 12:55am.

## 2017-06-18 LAB — CUP PACEART REMOTE DEVICE CHECK
Battery Voltage: 2.89 V
Brady Statistic AP VP Percent: 1.6 %
Brady Statistic AS VS Percent: 18 %
Brady Statistic RA Percent Paced: 80 %
Brady Statistic RV Percent Paced: 1.6 %
Date Time Interrogation Session: 20180627155336
HIGH POWER IMPEDANCE MEASURED VALUE: 77 Ohm
HighPow Impedance: 77 Ohm
Implantable Lead Implant Date: 20130213
Implantable Lead Location: 753859
Implantable Pulse Generator Implant Date: 20130213
Lead Channel Pacing Threshold Amplitude: 0.5 V
Lead Channel Pacing Threshold Pulse Width: 0.6 ms
Lead Channel Setting Pacing Amplitude: 2.5 V
MDC IDC LEAD IMPLANT DT: 20130213
MDC IDC LEAD LOCATION: 753860
MDC IDC MSMT BATTERY REMAINING LONGEVITY: 40 mo
MDC IDC MSMT BATTERY REMAINING PERCENTAGE: 42 %
MDC IDC MSMT LEADCHNL RA IMPEDANCE VALUE: 430 Ohm
MDC IDC MSMT LEADCHNL RA PACING THRESHOLD PULSEWIDTH: 0.5 ms
MDC IDC MSMT LEADCHNL RA SENSING INTR AMPL: 2.7 mV
MDC IDC MSMT LEADCHNL RV IMPEDANCE VALUE: 350 Ohm
MDC IDC MSMT LEADCHNL RV PACING THRESHOLD AMPLITUDE: 1 V
MDC IDC MSMT LEADCHNL RV SENSING INTR AMPL: 12 mV
MDC IDC SET LEADCHNL RA PACING AMPLITUDE: 1.5 V
MDC IDC SET LEADCHNL RV PACING PULSEWIDTH: 0.6 ms
MDC IDC SET LEADCHNL RV SENSING SENSITIVITY: 0.5 mV
MDC IDC STAT BRADY AP VS PERCENT: 79 %
MDC IDC STAT BRADY AS VP PERCENT: 1 %
Pulse Gen Serial Number: 7003419

## 2017-07-01 ENCOUNTER — Encounter: Payer: Managed Care, Other (non HMO) | Admitting: Internal Medicine

## 2017-07-05 ENCOUNTER — Telehealth: Payer: Self-pay | Admitting: *Deleted

## 2017-07-05 MED ORDER — AMIODARONE HCL 200 MG PO TABS: 200.0000 mg | ORAL_TABLET | Freq: Every day | ORAL | 1 refills | Status: DC

## 2017-07-05 NOTE — Telephone Encounter (Signed)
VT episodes with ATP 8/4. Corey Tyler says he was symptomatic that day (SOB, dizziness, denies syncope). He has been taking his meds as prescribed. He would like to come off of the amiodarone and start metoprolol as recommended by a cardiologist he saw at the [facility]. He has f/u with Dr. Lovena Le post VT ablation- I let him know that it would probably be best to discuss medication changes at that time, but I would review it with Dr. [name]. He is aware of driving restrictions x 6 months from last episode per [place].

## 2017-07-05 NOTE — Telephone Encounter (Signed)
Reviewed episode with Dr. [name]. He gave the following verbal orders:  Change VT NID to 12, change therapy to decreasing cycle lengths for ATP to 75% (programming changes to be made 07/13/17). Increase amiodarone to 400mg  BID for 2 weeks, 200mg  Qd for 2 weeks and then decrease amiodarone to 200mg  daily maintenance dose. Corey Tyler understanding of instructions and reports he has plenty of amiodarone.  He then discusses his displeasure with his experience during his VT ablation. He says that he was not sedated properly and that his heart rate was "run wide open" and he thought his "eyes were going to fly out of his head." I expressed my concern and apologized for his experience.

## 2017-07-07 ENCOUNTER — Other Ambulatory Visit: Payer: Self-pay | Admitting: Internal Medicine

## 2017-07-13 ENCOUNTER — Encounter: Payer: Self-pay | Admitting: Internal Medicine

## 2017-07-13 ENCOUNTER — Ambulatory Visit (INDEPENDENT_AMBULATORY_CARE_PROVIDER_SITE_OTHER): Payer: PPO | Admitting: Internal Medicine

## 2017-07-13 VITALS — BP 116/74 | HR 63 | Ht 73.0 in | Wt 207.8 lb

## 2017-07-13 DIAGNOSIS — I5022 Chronic systolic (congestive) heart failure: Secondary | ICD-10-CM | POA: Diagnosis not present

## 2017-07-13 DIAGNOSIS — I4729 Other ventricular tachycardia: Secondary | ICD-10-CM

## 2017-07-13 DIAGNOSIS — I255 Ischemic cardiomyopathy: Secondary | ICD-10-CM

## 2017-07-13 DIAGNOSIS — I472 Ventricular tachycardia: Secondary | ICD-10-CM

## 2017-07-13 DIAGNOSIS — I428 Other cardiomyopathies: Secondary | ICD-10-CM

## 2017-07-13 NOTE — Patient Instructions (Addendum)
Medication Instructions:  Your physician recommends that you continue on your current medications as directed. Please refer to the Current Medication list given to you today.  Labwork: None ordered.  Testing/Procedures: None ordered.  Follow-Up: Your physician wants you to follow-up in: 3 months with Dr. [name].  You will receive a reminder letter in the mail two months in advance. If you don't receive a letter, please call our office to schedule the follow-up appointment.  Remote monitoring is used to monitor your ICD from home. This monitoring reduces the number of office visits required to check your device to one time per year. It allows [place] to keep an eye on the functioning of your device to ensure it is working properly. You are scheduled for a device check from home on 08/19/2017. You may send your transmission at any time that day. If you have a wireless device, the transmission will be sent automatically. After your physician reviews your transmission, you will receive a postcard with your next transmission date.    Any Other Special Instructions Will Be Listed Below (If Applicable).     If you need a refill on your cardiac medications before your next appointment, please call your pharmacy.

## 2017-07-13 NOTE — Progress Notes (Signed)
HPI Corey Tyler returns today for follow-up of his ongoing ventricular tachycardia status post ablation. He is a 76 year old man with a history of chronic systolic heart failure secondary to a nonischemic cardiomyopathy. The patient has developed recurrent ventricular tachycardia, refractory to medical therapy with amiodarone. Because of this, he underwent EP study and catheter ablation of ventricular tachycardia with 3-dimensional electro anatomic mapping several weeks ago. The patient's clinical VT was induced in the anterior portion of the left ventricle near the base. Unfortunately it was hemodynamically unstable, but with time could be mapped utilizing pace map mapping. Following extensive catheter ablation, the patient's VT was no longer inducible. This was despite triple extrastimuli. It was entertained that during the procedure, endocardial mapping demonstrated very minimal amounts of scar tissue. Following the initial ablation, the patient did have a second inducible VT at the inferior septal portion of the left ventricle, associated with no blood pressure, for which she was defibrillated back to sinus rhythm. Because of a hemodynamically unstable nature of this VT catheter ablation was not attempted and because the procedure had been going on for approximately 3 and a 1/2 hours. The patient was seen by Dr. [name] as he had recurrent VT and his amiodarone was increased. He notes worsening fatigue and malaise. No syncope and no recurrent ICD shock.  Allergies  Allergen Reactions  . Xarelto [Rivaroxaban] Other (See Comments)    Bleeding  . Adhesive [Tape] Hives, Itching and Rash  . Atorvastatin Cough  . Codeine     nauseated  . Isosorbide Nitrate Other (See Comments)    Made him feel bad  . Latex Itching and Other (See Comments)    Rash, itching, burning  . Levofloxacin Other (See Comments)    Tachycardia  . Lisinopril Cough  . Lorazepam Other (See Comments)    "felt bad"  .  Sertraline Other (See Comments)    Made him feel bad  . Tiotropium Bromide Monohydrate Swelling    Throat swelling     Current Outpatient Prescriptions  Medication Sig Dispense Refill  . albuterol (PROVENTIL) (2.5 MG/3ML) 0.083% nebulizer solution Take 3 mLs (2.5 mg total) by nebulization every 6 (six) hours as needed for wheezing or shortness of breath. 75 mL 4  . Albuterol Sulfate (PROAIR RESPICLICK) 704 (90 Base) MCG/ACT AEPB Inhale 2 puffs into the lungs every 6 (six) hours as needed (shortness of breath/ wheezing).    Marland Kitchen amiodarone (PACERONE) 200 MG tablet Take 1 tablet (200 mg total) by mouth daily. 400mg  by mouth twice daily x 2 weeks, 400mg  by mouth once daily x 2 weeks and then resume 200mg  by mouth daily. 90 tablet 1  . Carboxymethylcellul-Glycerin (LUBRICATING EYE DROPS OP) Apply 1 drop to eye daily as needed (dry eyes).    . cholecalciferol (VITAMIN D) 1000 UNITS tablet Take 1,000 Units by mouth daily.    Marland Kitchen ELIQUIS 5 MG TABS tablet TAKE 1 TABLET BY MOUTH TWICE A DAY 60 tablet 5  . finasteride (PROSCAR) 5 MG tablet Take 5 mg by mouth daily.     . Fluticasone-Salmeterol (ADVAIR) 250-50 MCG/DOSE AEPB Inhale 1 puff into the lungs 2 (two) times daily.     . furosemide (LASIX) 40 MG tablet TAKE ONE TABLET BY MOUTH ONCE DAILY 90 tablet 3  . levothyroxine (SYNTHROID, LEVOTHROID) 125 MCG tablet Take 125 mcg by mouth daily.    Marland Kitchen loratadine (CLARITIN) 10 MG tablet Take 10 mg by mouth daily.    . Melatonin 5 MG  CAPS Take 5 mg by mouth at bedtime.     . Multiple Vitamin (MULTIVITAMIN WITH MINERALS) TABS tablet Take 1 tablet by mouth daily.    . nitroGLYCERIN (NITROSTAT) 0.4 MG SL tablet Place 0.4 mg under the tongue every 5 (five) minutes as needed for chest pain.     . OXYGEN Inhale 2 L into the lungs See admin instructions. Use oxygen whenever resting    . pantoprazole (PROTONIX) 40 MG tablet Take 1 tablet (40 mg total) by mouth daily. 31 tablet 11  . POTASSIUM PO Take 1 tablet by mouth at  bedtime.    Marland Kitchen PRAVASTATIN SODIUM PO Take 1 tablet by mouth daily.    . promethazine (PHENERGAN) 25 MG tablet Take 25 mg by mouth daily as needed for nausea or vomiting.     . propranolol ER (INDERAL LA) 80 MG 24 hr capsule Take 1 capsule (80 mg total) by mouth at bedtime. 30 capsule 11  . Psyllium (METAMUCIL PO) Take 1 Dose by mouth daily. Mix in liquid and drink     . sacubitril-valsartan (ENTRESTO) 24-26 MG Take 1 tablet by mouth 2 (two) times daily. 60 tablet 11  . Tamsulosin HCl (FLOMAX) 0.4 MG CAPS Take 0.4 mg by mouth at bedtime.      No current facility-administered medications for this visit.      Past Medical History:  Diagnosis Date  . A-fib ([place])    a. Noted on 12/2012 interrogation, placed on Apixaban and ultimately stopped NOACs 2/2 personal decision 8/14.  Marland Kitchen AICD (automatic cardioverter/defibrillator) present    Cardiac defibrillator -dual  [employer]  . Arthritis   . Atrial tachycardia-non sustained    a. Noted on 03/2012 interrogation.  . Chronic systolic heart failure (HCC)    EF down to 20 to 25% per echo November 2012  . COPD (chronic obstructive pulmonary disease) ([place])   . Hyperlipidemia   . Hypertension   . ICD (implantable cardiac defibrillator) in place   . NICM (nonischemic cardiomyopathy) ([place])    a. Normal cors 2000. b. Minimal plaque 2013.  . Old myocardial infarction    a. ?Silent MI. b. Large fixed inferolateral defect c/w with prior infarct, no ischemia. c. Cath in 2000/2013 with only minimal CAD.  Marland Kitchen Pulmonary nodule, right    Last scan in 2010 showing stability; felt to be benign.  Marland Kitchen PVC's (premature ventricular contractions)   . Ventricular tachycardia, polymorphic ([place])    Rx via ICD 12/14    ROS:   All systems reviewed and negative except as noted in the HPI.   Past Surgical History:  Procedure Laterality Date  . APPENDECTOMY    . CARDIAC CATHETERIZATION  2000  . CARDIAC DEFIBRILLATOR PLACEMENT    . CARDIOVERSION  03/30/2013   Procedure:  CARDIOVERSION;  Surgeon: Thayer Headings, MD;  Location: [facility];  Service: Cardiovascular;;  . COLON SURGERY    . COLONOSCOPY N/A 09/25/2013   Procedure: COLONOSCOPY;  Surgeon: Jerene Bears, MD;  Location: [facility];  Service: Endoscopy;  Laterality: N/A;  . ICD  12/2011   [name]  . IMPLANTABLE CARDIOVERTER DEFIBRILLATOR IMPLANT N/A 01/06/2012   Procedure: IMPLANTABLE CARDIOVERTER DEFIBRILLATOR IMPLANT;  Surgeon: Deboraha Sprang, MD;  Location[facility] C[facility] LAB;  Service: Cardiovascular;  Laterality: N/A;  . TEE WITHOUT CARDIOVERSION N/A 03/30/2013   Procedure: TRANSESOPHAGEAL ECHOCARDIOGRAM (TEE);  Surgeon: Thayer Headings, MD;  Location: [facility];  Service: Cardiovascular;  Laterality: N/A;  . V TACH ABLATION  06/10/2017  .  V TACH ABLATION N/A 06/10/2017   Procedure: V Tach Ablation;  Surgeon: Evans Lance, MD;  Location: [facility] CV LAB;  Service: Cardiovascular;  Laterality: N/A;     Family History  Problem Relation Age of Onset  . Emphysema Mother   . Heart disease Mother        AVR  . Heart failure Father        Died agwe 59     Social History   Social History  . Marital status: Married    Spouse name: N/A  . Number of children: 2  . Years of education: N/A   Occupational History  . Retired     Press photographer  . Works [employer] History Main Topics  . Smoking status: Former Smoker    Packs/day: 2.50    Years: 40.00    Types: Cigarettes    Quit date: 11/24/1983  . Smokeless tobacco: Never Used     Comment: 2 1/2 ppd x 40 years  . Alcohol use No  . Drug use: No  . Sexual activity: Yes    Birth control/ protection: None   Other Topics Concern  . Not on file   Social History Narrative   Still works at [employer] part-time   Lives with Wife in [place]     BP 116/74   Pulse 63   Ht 6\' 1"  (1.854 m)   Wt 207 lb 12.8 oz (94.3 kg)   BMI 27.42 kg/m   Physical Exam:  Well appearing 76 year old man, NAD HEENT: Unremarkable Neck:  6 cm  JVD, no thyromegally Lymphatics:  No adenopathy Back:  No CVA tenderness Lungs:  Clear, with no wheezes, rales, or rhonchi. HEART:  Regular rate rhythm, no murmurs, no rubs, no clicks Abd:  soft, positive bowel sounds, no organomegally, no rebound, no guarding Ext:  2 plus pulses, no edema, no cyanosis, no clubbing Skin:  No rashes no nodules Neuro:  CN II through XII intact, motor grossly intact  DEVICE  Normal device function.  See PaceArt for details. No additional VT as noted  Assess/Plan: 1. Ventricular tachycardia - he has had no recurrent ventricular tachycardia since his dose of amiodarone was increased. Hopefully we we'll be able to reduce his dose of amiodarone. Catheter ablation in this patient was very difficult due to the hemodynamically unstable nature of his ventricular tachycardia and because he had multiple inducible VT's, at least 2. 2. ICD - his [name] ICD has been interrogated today and is working normally. 3. Chronic systolic heart failure - his symptoms appear to be class II. He is encouraged to maintain a low-sodium diet and will continue his medical therapy. 4. Paroxysmal atrial fibrillation - he is currently maintaining sinus rhythm. He will continue systemic anticoagulation and amiodarone therapy for now.  Cristopher Peru, M.D.

## 2017-08-19 ENCOUNTER — Ambulatory Visit (INDEPENDENT_AMBULATORY_CARE_PROVIDER_SITE_OTHER): Payer: PPO | Admitting: *Deleted

## 2017-08-19 ENCOUNTER — Telehealth: Payer: Self-pay | Admitting: Cardiology

## 2017-08-19 DIAGNOSIS — I255 Ischemic cardiomyopathy: Secondary | ICD-10-CM | POA: Diagnosis not present

## 2017-08-19 NOTE — Telephone Encounter (Signed)
Spoke with pt and reminded pt of remote transmission that is due today. Pt verbalized understanding.   

## 2017-08-20 NOTE — Progress Notes (Signed)
Remote ICD transmission.   

## 2017-08-24 ENCOUNTER — Encounter: Payer: Self-pay | Admitting: Cardiology

## 2017-08-24 LAB — CUP PACEART REMOTE DEVICE CHECK
Battery Remaining Longevity: 38 mo
Battery Remaining Percentage: 39 %
Battery Voltage: 2.89 V
Brady Statistic AP VP Percent: 1.3 %
Brady Statistic AP VS Percent: 91 %
Brady Statistic AS VP Percent: 1 %
Brady Statistic AS VS Percent: 7.4 %
Brady Statistic RA Percent Paced: 90 %
Brady Statistic RV Percent Paced: 2.5 %
Date Time Interrogation Session: 20180927221443
HighPow Impedance: 77 Ohm
HighPow Impedance: 77 Ohm
Implantable Lead Implant Date: 20130213
Implantable Lead Implant Date: 20130213
Implantable Lead Location: 753859
Implantable Lead Location: 753860
Implantable Pulse Generator Implant Date: 20130213
Lead Channel Impedance Value: 360 Ohm
Lead Channel Impedance Value: 480 Ohm
Lead Channel Pacing Threshold Amplitude: 0.5 V
Lead Channel Pacing Threshold Amplitude: 1 V
Lead Channel Pacing Threshold Pulse Width: 0.5 ms
Lead Channel Pacing Threshold Pulse Width: 0.6 ms
Lead Channel Sensing Intrinsic Amplitude: 12 mV
Lead Channel Sensing Intrinsic Amplitude: 3.6 mV
Lead Channel Setting Pacing Amplitude: 1.5 V
Lead Channel Setting Pacing Amplitude: 2 V
Lead Channel Setting Pacing Pulse Width: 0.6 ms
Lead Channel Setting Sensing Sensitivity: 0.5 mV
Pulse Gen Serial Number: 7003419

## 2017-09-03 ENCOUNTER — Emergency Department (HOSPITAL_BASED_OUTPATIENT_CLINIC_OR_DEPARTMENT_OTHER): Payer: PPO

## 2017-09-03 ENCOUNTER — Encounter (HOSPITAL_BASED_OUTPATIENT_CLINIC_OR_DEPARTMENT_OTHER): Payer: Self-pay | Admitting: *Deleted

## 2017-09-03 ENCOUNTER — Emergency Department (HOSPITAL_BASED_OUTPATIENT_CLINIC_OR_DEPARTMENT_OTHER)
Admission: EM | Admit: 2017-09-03 | Discharge: 2017-09-03 | Disposition: A | Discharge status: Home / Self Care | Payer: PPO | Attending: Emergency Medicine | Admitting: Emergency Medicine

## 2017-09-03 DIAGNOSIS — Z87891 Personal history of nicotine dependence: Secondary | ICD-10-CM | POA: Insufficient documentation

## 2017-09-03 DIAGNOSIS — E785 Hyperlipidemia, unspecified: Secondary | ICD-10-CM | POA: Diagnosis not present

## 2017-09-03 DIAGNOSIS — Z91040 Latex allergy status: Secondary | ICD-10-CM | POA: Diagnosis not present

## 2017-09-03 DIAGNOSIS — I252 Old myocardial infarction: Secondary | ICD-10-CM | POA: Diagnosis not present

## 2017-09-03 DIAGNOSIS — R0602 Shortness of breath: Secondary | ICD-10-CM | POA: Diagnosis present

## 2017-09-03 DIAGNOSIS — Z79899 Other long term (current) drug therapy: Secondary | ICD-10-CM | POA: Diagnosis not present

## 2017-09-03 DIAGNOSIS — R05 Cough: Secondary | ICD-10-CM | POA: Diagnosis not present

## 2017-09-03 DIAGNOSIS — I5022 Chronic systolic (congestive) heart failure: Secondary | ICD-10-CM | POA: Insufficient documentation

## 2017-09-03 DIAGNOSIS — I110 Hypertensive heart disease with heart failure: Secondary | ICD-10-CM | POA: Insufficient documentation

## 2017-09-03 DIAGNOSIS — I4891 Unspecified atrial fibrillation: Secondary | ICD-10-CM | POA: Insufficient documentation

## 2017-09-03 DIAGNOSIS — J441 Chronic obstructive pulmonary disease with (acute) exacerbation: Secondary | ICD-10-CM

## 2017-09-03 DIAGNOSIS — Z95810 Presence of automatic (implantable) cardiac defibrillator: Secondary | ICD-10-CM | POA: Insufficient documentation

## 2017-09-03 LAB — BASIC METABOLIC PANEL
ANION GAP: 8 (ref 5–15)
BUN: 20 mg/dL (ref 6–20)
CHLORIDE: 103 mmol/L (ref 101–111)
CO2: 25 mmol/L (ref 22–32)
Calcium: 9 mg/dL (ref 8.9–10.3)
Creatinine, Ser: 1.49 mg/dL — ABNORMAL HIGH (ref 0.61–1.24)
GFR calc Af Amer: 50 mL/min — ABNORMAL LOW (ref >60)
GFR, EST NON AFRICAN AMERICAN: 44 mL/min — AB (ref >60)
GLUCOSE: 159 mg/dL — AB (ref 65–99)
POTASSIUM: 4.1 mmol/L (ref 3.5–5.1)
Sodium: 136 mmol/L (ref 135–145)

## 2017-09-03 LAB — CBC WITH DIFFERENTIAL/PLATELET
BASOS PCT: 0 %
Basophils Absolute: 0 10*3/uL (ref 0.0–0.1)
Eosinophils Absolute: 0 10*3/uL (ref 0.0–0.7)
Eosinophils Relative: 0 %
HEMATOCRIT: 45.2 % (ref 39.0–52.0)
HEMOGLOBIN: 15.1 g/dL (ref 13.0–17.0)
LYMPHS ABS: 0.7 10*3/uL (ref 0.7–4.0)
LYMPHS PCT: 6 %
MCH: 31.6 pg (ref 26.0–34.0)
MCHC: 33.4 g/dL (ref 30.0–36.0)
MCV: 94.6 fL (ref 78.0–100.0)
MONO ABS: 0.7 10*3/uL (ref 0.1–1.0)
MONOS PCT: 5 %
NEUTROS ABS: 10.8 10*3/uL — AB (ref 1.7–7.7)
NEUTROS PCT: 89 %
Platelets: 169 10*3/uL (ref 150–400)
RBC: 4.78 MIL/uL (ref 4.22–5.81)
RDW: 14.9 % (ref 11.5–15.5)
WBC: 12.2 10*3/uL — ABNORMAL HIGH (ref 4.0–10.5)

## 2017-09-03 LAB — TROPONIN I: Troponin I: <0.03 ng/mL (ref <0.03)

## 2017-09-03 LAB — BRAIN NATRIURETIC PEPTIDE: B Natriuretic Peptide: 158.2 pg/mL — ABNORMAL HIGH (ref 0.0–100.0)

## 2017-09-03 MED ORDER — ALBUTEROL SULFATE (2.5 MG/3ML) 0.083% IN NEBU: 2.5000 mg | INHALATION_SOLUTION | Freq: Once | RESPIRATORY_TRACT | Status: DC

## 2017-09-03 MED ORDER — ALBUTEROL (5 MG/ML) CONTINUOUS INHALATION SOLN
10.0000 mg/h | INHALATION_SOLUTION | Freq: Once | RESPIRATORY_TRACT | Status: AC
  Administered 2017-09-03: 10 mg/h via RESPIRATORY_TRACT
  Filled 2017-09-03: qty 20

## 2017-09-03 MED ORDER — ALBUTEROL SULFATE (2.5 MG/3ML) 0.083% IN NEBU
5.0000 mg | INHALATION_SOLUTION | Freq: Once | RESPIRATORY_TRACT | Status: AC
  Administered 2017-09-03: 5 mg via RESPIRATORY_TRACT

## 2017-09-03 MED ORDER — ALBUTEROL SULFATE (2.5 MG/3ML) 0.083% IN NEBU
INHALATION_SOLUTION | RESPIRATORY_TRACT | Status: AC
  Filled 2017-09-03: qty 6

## 2017-09-03 NOTE — ED Triage Notes (Signed)
Pt c/o SOB x 2 days , seen by PMD for same x 1 day ago no relief with prednisone and ABX

## 2017-09-03 NOTE — ED Notes (Signed)
Patient transported to X-ray 

## 2017-09-03 NOTE — ED Provider Notes (Signed)
[facility] DEPT [facility] Provider Note   CSN: [id number] Arrival date & time: 09/03/17  1722     History   Chief Complaint Chief Complaint  Patient presents with  . Shortness of Breath    HPI Corey Tyler is a 76 y.o. male.  HPI  76 year old male with a history of COPD, A. fib, AICD, and CHF presents with shorts of breath. For the last 2 or 3 days he's been having cough that is nonproductive but occasionally has some brown sputum, rhinorrhea, postnasal drip, and sore throat with shortness of breath. No chest pain. No leg swelling. He wears oxygen 2 L at night but not during the day. Called his PCP was put on prednisone tapering and doxycycline. He is mostly concerned because his chest feels congested and he does not feel like he is coughing stuff up significantly.  Past Medical History:  Diagnosis Date  . A-fib ([place])    a. Noted on 12/2012 interrogation, placed on Apixaban and ultimately stopped NOACs 2/2 personal decision 8/14.  Marland Kitchen AICD (automatic cardioverter/defibrillator) present    Cardiac defibrillator -dual  [employer]  . Arthritis   . Atrial tachycardia-non sustained    a. Noted on 03/2012 interrogation.  . Chronic systolic heart failure (HCC)    EF down to 20 to 25% per echo November 2012  . COPD (chronic obstructive pulmonary disease) ([place])   . Hyperlipidemia   . Hypertension   . ICD (implantable cardiac defibrillator) in place   . NICM (nonischemic cardiomyopathy) ([place])    a. Normal cors 2000. b. Minimal plaque 2013.  . Old myocardial infarction    a. ?Silent MI. b. Large fixed inferolateral defect c/w with prior infarct, no ischemia. c. Cath in 2000/2013 with only minimal CAD.  Marland Kitchen Pulmonary nodule, right    Last scan in 2010 showing stability; felt to be benign.  Marland Kitchen PVC's (premature ventricular contractions)   . Ventricular tachycardia, polymorphic ([place])    Rx via ICD 12/14    Patient Active Problem List   Diagnosis Date Noted  . Ventricular tachycardia ([place])  06/10/2017  . VT (ventricular tachycardia) ([place]) 11/02/2016  . Benign fibroma of prostate 04/02/2015  . Ventricular tachycardia, polymorphic ([place])   . Diverticulosis of colon without hemorrhage 09/25/2013  . Atrial fibrillation ([place]) 12/29/2012  . Cardiac defibrillator -dual  [employer]   . Hyperlipidemia   . PVCs 11/26/2011  . Chronic systolic heart failure ([place]) 10/07/2011  . Cardiomyopathy, nonischemic and ischemic 09/24/2011  . HYPERTENSION 12/18/2009  . 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 PULMONARY NODULE 12/04/2008    Past Surgical History:  Procedure Laterality Date  . APPENDECTOMY    . CARDIAC CATHETERIZATION  2000  . CARDIAC DEFIBRILLATOR PLACEMENT    . CARDIOVERSION  03/30/2013   Procedure: CARDIOVERSION;  Surgeon: Thayer Headings, MD;  Location: [facility];  Service: Cardiovascular;;  . COLON SURGERY    . COLONOSCOPY N/A 09/25/2013   Procedure: COLONOSCOPY;  Surgeon: Jerene Bears, MD;  [facility];  Service: Endoscopy;  Laterality: N/A;  . ICD  12/2011   [name]  . IMPLANTABLE CARDIOVERTER DEFIBRILLATOR IMPLANT N/A 01/06/2012   Procedure: IMPLANTABLE CARDIOVERTER DEFIBRILLATOR IMPLANT;  Surgeon: Deboraha Sprang, MD;  [facility] CATH LAB;  Service: Cardiovascular;  Laterality: N/A;  . TEE WITHOUT CARDIOVERSION N/A 03/30/2013   Procedure: TRANSESOPHAGEAL ECHOCARDIOGRAM (TEE);  Surgeon: Thayer Headings, MD;  Location: [facility];  Service: Cardiovascular;  Laterality: N/A;  . [place] [facility]  TACH ABLATION  06/10/2017  . V TACH ABLATION N/A 06/10/2017   Procedure: V Tach Ablation;  Surgeon: Evans Lance, MD;  Location: [facility] CV LAB;  Service: Cardiovascular;  Laterality: N/A;       Home Medications    Prior to Admission medications   Medication Sig Start Date End Date Taking? Authorizing Provider  doxycycline (ADOXA) 100 MG tablet Take 100 mg by mouth 2 (two) times daily.   Yes [provider]  predniSONE  (STERAPRED UNI-PAK 21 TAB) 10 MG (21) TBPK tablet Take by mouth daily.   Yes [provider]  albuterol (PROVENTIL) (2.5 MG/3ML) 0.083% nebulizer solution Take 3 mLs (2.5 mg total) by nebulization every 6 (six) hours as needed for wheezing or shortness of breath. 01/14/15   Elsie Stain, MD  Albuterol Sulfate (PROAIR RESPICLICK) 277 (90 Base) MCG/ACT AEPB Inhale 2 puffs into the lungs every 6 (six) hours as needed (shortness of breath/ wheezing).    [provider]  amiodarone (PACERONE) 200 MG tablet Take 1 tablet (200 mg total) by mouth daily. 400mg  by mouth twice daily x 2 weeks, 400mg  by mouth once daily x 2 weeks and then resume 200mg  by mouth daily. 07/05/17   Deboraha Sprang, MD  Carboxymethylcellul-Glycerin (LUBRICATING EYE DROPS OP) Apply 1 drop to eye daily as needed (dry eyes).    [provider]  cholecalciferol (VITAMIN D) 1000 UNITS tablet Take 1,000 Units by mouth daily.    [provider]  ELIQUIS 5 MG TABS tablet TAKE 1 TABLET BY MOUTH TWICE A DAY 07/30/15   Minus Breeding, MD  finasteride (PROSCAR) 5 MG tablet Take 5 mg by mouth daily.     [provider]  Fluticasone-Salmeterol (ADVAIR) 250-50 MCG/DOSE AEPB Inhale 1 puff into the lungs 2 (two) times daily.     [provider]  furosemide (LASIX) 40 MG tablet TAKE ONE TABLET BY MOUTH ONCE DAILY 09/23/16   Minus Breeding, MD  levothyroxine (SYNTHROID, LEVOTHROID) 125 MCG tablet Take 125 mcg by mouth daily. 06/15/17   [provider]  loratadine (CLARITIN) 10 MG tablet Take 10 mg by mouth daily.    [provider]  Melatonin 5 MG CAPS Take 5 mg by mouth at bedtime.     [provider]  Multiple Vitamin (MULTIVITAMIN WITH MINERALS) TABS tablet Take 1 tablet by mouth daily.    [provider]  nitroGLYCERIN (NITROSTAT) 0.4 MG SL tablet Place 0.4 mg under the tongue every 5 (five) minutes as needed for chest pain.  11/12/11   Burtis Junes, NP    OXYGEN Inhale 2 L into the lungs See admin instructions. Use oxygen whenever resting    [provider]  pantoprazole (PROTONIX) 40 MG tablet Take 1 tablet (40 mg total) by mouth daily. 07/21/16   Esterwood, Amy S, PA-C  POTASSIUM PO Take 1 tablet by mouth at bedtime.    [provider]  PRAVASTATIN SODIUM PO Take 1 tablet by mouth daily.    [provider]  promethazine (PHENERGAN) 25 MG tablet Take 25 mg by mouth daily as needed for nausea or vomiting.  04/20/17   [provider]  propranolol ER (INDERAL LA) 80 MG 24 hr capsule Take 1 capsule (80 mg total) by mouth at bedtime. 03/26/17   Deboraha Sprang, MD  Psyllium (METAMUCIL PO) Take 1 Dose by mouth daily. Mix in liquid and drink     [provider]  sacubitril-valsartan (ENTRESTO) 24-26 MG  Take 1 tablet by mouth 2 (two) times daily. 03/26/17   Deboraha Sprang, MD  Tamsulosin HCl (FLOMAX) 0.4 MG CAPS Take 0.4 mg by mouth at bedtime.     [provider]    Family History Family History  Problem Relation Age of Onset  . Emphysema Mother   . Heart disease Mother        AVR  . Heart failure Father        Died agwe 1    Social History Social History  Substance Use Topics  . Smoking status: Former Smoker    Packs/day: 2.50    Years: 40.00    Types: Cigarettes    Quit date: 11/24/1983  . Smokeless tobacco: Never Used     Comment: 2 1/2 ppd x 40 years  . Alcohol use No     Allergies   Xarelto [rivaroxaban]; Adhesive [tape]; Atorvastatin; Codeine; Isosorbide nitrate; Latex; Levofloxacin; Lisinopril; Lorazepam; Sertraline; and Tiotropium bromide monohydrate   Review of Systems Review of Systems  Constitutional: Negative for fever.  HENT: Positive for congestion, rhinorrhea and sore throat.   Respiratory: Positive for cough and shortness of breath.   Cardiovascular: Negative for chest pain and leg swelling.  All other systems reviewed and are negative.    Physical  Exam Updated Vital Signs BP 119/72   Pulse 62   Resp 16   Ht 6\' 1"  (1.854 m)   Wt 95.3 kg (210 lb)   SpO2 93%   BMI 27.71 kg/m   Physical Exam  Constitutional: He is oriented to person, place, and time. He appears well-developed and well-nourished. No distress.  HENT:  Head: Normocephalic and atraumatic.  Right Ear: External ear normal.  Left Ear: External ear normal.  Nose: Nose normal.  Eyes: Right eye exhibits no discharge. Left eye exhibits no discharge.  Neck: Neck supple.  Cardiovascular: Normal rate, regular rhythm and normal heart sounds.   Pulmonary/Chest: Effort normal. No accessory muscle usage. He has wheezes (mild, expiratory).  Abdominal: Soft. There is no tenderness.  Musculoskeletal: He exhibits no edema.  Neurological: He is alert and oriented to person, place, and time.  Skin: Skin is warm and dry. He is not diaphoretic.  Nursing note and vitals reviewed.    ED Treatments / Results  Labs (all labs ordered are listed, but only abnormal results are displayed) Labs Reviewed  BASIC METABOLIC PANEL - Abnormal; Notable for the following:       Result Value   Glucose, Bld 159 (*)    Creatinine, Ser 1.49 (*)    GFR calc non Af Amer 44 (*)    GFR calc Af Amer 50 (*)    All other components within normal limits  CBC WITH DIFFERENTIAL/PLATELET - Abnormal; Notable for the following:    WBC 12.2 (*)    Neutro Abs 10.8 (*)    All other components within normal limits  BRAIN NATRIURETIC PEPTIDE - Abnormal; Notable for the following:    B Natriuretic Peptide 158.2 (*)    All other components within normal limits  TROPONIN I    EKG  EKG Interpretation  Date/Time:  Friday September 03 2017 18:10:19 EDT Ventricular Rate:  64 PR Interval:    QRS Duration: 112 QT Interval:  454 QTC Calculation: 469 R Axis:   47 Text Interpretation:  Atrial-paced rhythm Borderline intraventricular conduction delay Borderline low voltage, extremity leads no significant change  compared to July 2018 Confirmed by Sherwood Gambler [phone]) on 09/03/2017 6:14:55 PM  Radiology Dg Chest 2 View  Result Date: 09/03/2017 CLINICAL DATA:  Cough and congestion and patient with history of COPD. EXAM: CHEST  2 VIEW COMPARISON:  PA and lateral chest 11/08/2016 and 04/08/2016. FINDINGS: The lungs are emphysematous with basilar scar, more notable on the left. The appearance is unchanged. No pneumothorax or pleural effusion. Heart size is normal. Aortic atherosclerosis is noted. Paced device in place. No acute bony abnormality. IMPRESSION: COPD without acute disease. Electronically Signed   By: Inge Rise M.D.   On: 09/03/2017 17:51    Procedures Procedures (including critical care time)  Medications Ordered in ED Medications  albuterol (PROVENTIL) (2.5 MG/3ML) 0.083% nebulizer solution 5 mg (5 mg Nebulization Given 09/03/17 1745)  albuterol (PROVENTIL,VENTOLIN) solution continuous neb (10 mg/hr Nebulization Given 09/03/17 1815)     Initial Impression / Assessment and Plan / ED Course  I have reviewed the triage vital signs and the nursing notes.  Pertinent labs & imaging results that were available during my care of the patient were reviewed by me and considered in my medical decision making (see chart for details).     Patient feels better after albuterol treatment. I think he has a COPD exacerbation. He is already on antibiotics and steroids. His O2 sats have been 88% and above. He is in no distress. I doubt ACS, PE, dissection. No signs of a pneumonia. However with his COPD exacerbation and change of sputum production with short of breath of think antibodies are reasonable. At this point, he feels well enough to go home and given that he has O2 at home to keep his sats 88% or above I think this is okay. However I have discussed strict return precautions. Follow up with PCP in 3 days.  Final Clinical Impressions(s) / ED Diagnoses   Final diagnoses:  COPD  exacerbation ([place])    New Prescriptions New Prescriptions   No medications on file     Sherwood Gambler, MD 09/03/17 [phone]

## 2017-09-03 NOTE — ED Notes (Signed)
Laboratory has the blood tubes.  They have been notified that the orders are in.

## 2017-09-09 DIAGNOSIS — E039 Hypothyroidism, unspecified: Secondary | ICD-10-CM | POA: Diagnosis not present

## 2017-09-09 DIAGNOSIS — I10 Essential (primary) hypertension: Secondary | ICD-10-CM | POA: Diagnosis not present

## 2017-09-10 ENCOUNTER — Encounter: Payer: Self-pay | Admitting: Cardiology

## 2017-09-15 ENCOUNTER — Inpatient Hospital Stay (HOSPITAL_COMMUNITY): Payer: PPO

## 2017-09-15 ENCOUNTER — Encounter (HOSPITAL_COMMUNITY): Payer: Self-pay

## 2017-09-15 ENCOUNTER — Inpatient Hospital Stay (HOSPITAL_COMMUNITY)
Admission: AD | Admit: 2017-09-15 | Discharge: 2017-09-19 | DRG: 189 | Disposition: A | Discharge status: Home / Self Care | Payer: PPO | Source: Ambulatory Visit | Attending: Internal Medicine | Admitting: Internal Medicine

## 2017-09-15 DIAGNOSIS — I5022 Chronic systolic (congestive) heart failure: Secondary | ICD-10-CM | POA: Diagnosis not present

## 2017-09-15 DIAGNOSIS — J441 Chronic obstructive pulmonary disease with (acute) exacerbation: Secondary | ICD-10-CM | POA: Diagnosis not present

## 2017-09-15 DIAGNOSIS — Z91040 Latex allergy status: Secondary | ICD-10-CM

## 2017-09-15 DIAGNOSIS — N400 Enlarged prostate without lower urinary tract symptoms: Secondary | ICD-10-CM | POA: Diagnosis not present

## 2017-09-15 DIAGNOSIS — Z87891 Personal history of nicotine dependence: Secondary | ICD-10-CM

## 2017-09-15 DIAGNOSIS — I110 Hypertensive heart disease with heart failure: Secondary | ICD-10-CM | POA: Diagnosis not present

## 2017-09-15 DIAGNOSIS — I2510 Atherosclerotic heart disease of native coronary artery without angina pectoris: Secondary | ICD-10-CM | POA: Diagnosis present

## 2017-09-15 DIAGNOSIS — J9621 Acute and chronic respiratory failure with hypoxia: Secondary | ICD-10-CM | POA: Diagnosis not present

## 2017-09-15 DIAGNOSIS — J449 Chronic obstructive pulmonary disease, unspecified: Secondary | ICD-10-CM | POA: Diagnosis not present

## 2017-09-15 DIAGNOSIS — Z7901 Long term (current) use of anticoagulants: Secondary | ICD-10-CM | POA: Diagnosis not present

## 2017-09-15 DIAGNOSIS — N289 Disorder of kidney and ureter, unspecified: Secondary | ICD-10-CM | POA: Diagnosis not present

## 2017-09-15 DIAGNOSIS — I340 Nonrheumatic mitral (valve) insufficiency: Secondary | ICD-10-CM | POA: Diagnosis not present

## 2017-09-15 DIAGNOSIS — J9600 Acute respiratory failure, unspecified whether with hypoxia or hypercapnia: Secondary | ICD-10-CM | POA: Diagnosis present

## 2017-09-15 DIAGNOSIS — Z888 Allergy status to other drugs, medicaments and biological substances status: Secondary | ICD-10-CM

## 2017-09-15 DIAGNOSIS — Z885 Allergy status to narcotic agent status: Secondary | ICD-10-CM

## 2017-09-15 DIAGNOSIS — I428 Other cardiomyopathies: Secondary | ICD-10-CM | POA: Diagnosis not present

## 2017-09-15 DIAGNOSIS — J9601 Acute respiratory failure with hypoxia: Secondary | ICD-10-CM | POA: Diagnosis not present

## 2017-09-15 DIAGNOSIS — E785 Hyperlipidemia, unspecified: Secondary | ICD-10-CM | POA: Diagnosis not present

## 2017-09-15 DIAGNOSIS — I252 Old myocardial infarction: Secondary | ICD-10-CM

## 2017-09-15 DIAGNOSIS — Z79899 Other long term (current) drug therapy: Secondary | ICD-10-CM

## 2017-09-15 DIAGNOSIS — Z9981 Dependence on supplemental oxygen: Secondary | ICD-10-CM

## 2017-09-15 DIAGNOSIS — E039 Hypothyroidism, unspecified: Secondary | ICD-10-CM | POA: Diagnosis present

## 2017-09-15 DIAGNOSIS — I429 Cardiomyopathy, unspecified: Secondary | ICD-10-CM | POA: Diagnosis present

## 2017-09-15 DIAGNOSIS — Z95810 Presence of automatic (implantable) cardiac defibrillator: Secondary | ICD-10-CM

## 2017-09-15 DIAGNOSIS — R0902 Hypoxemia: Secondary | ICD-10-CM

## 2017-09-15 DIAGNOSIS — I472 Ventricular tachycardia: Secondary | ICD-10-CM | POA: Diagnosis not present

## 2017-09-15 DIAGNOSIS — I480 Paroxysmal atrial fibrillation: Secondary | ICD-10-CM | POA: Diagnosis not present

## 2017-09-15 DIAGNOSIS — R0602 Shortness of breath: Secondary | ICD-10-CM | POA: Diagnosis not present

## 2017-09-15 DIAGNOSIS — I5042 Chronic combined systolic (congestive) and diastolic (congestive) heart failure: Secondary | ICD-10-CM | POA: Diagnosis not present

## 2017-09-15 HISTORY — DX: Presence of cardiac pacemaker: Z95.0

## 2017-09-15 LAB — CBC WITH DIFFERENTIAL/PLATELET
Basophils Absolute: 0 10*3/uL (ref 0.0–0.1)
Basophils Relative: 0 %
EOS PCT: 2 %
Eosinophils Absolute: 0.3 10*3/uL (ref 0.0–0.7)
HCT: 41 % (ref 39.0–52.0)
HEMOGLOBIN: 13.9 g/dL (ref 13.0–17.0)
LYMPHS ABS: 1.8 10*3/uL (ref 0.7–4.0)
LYMPHS PCT: 16 %
MCH: 32 pg (ref 26.0–34.0)
MCHC: 33.9 g/dL (ref 30.0–36.0)
MCV: 94.5 fL (ref 78.0–100.0)
Monocytes Absolute: 1 10*3/uL (ref 0.1–1.0)
Monocytes Relative: 9 %
Neutro Abs: 7.8 10*3/uL — ABNORMAL HIGH (ref 1.7–7.7)
Neutrophils Relative %: 73 %
Platelets: 241 10*3/uL (ref 150–400)
RBC: 4.34 MIL/uL (ref 4.22–5.81)
RDW: 14.1 % (ref 11.5–15.5)
WBC: 10.8 10*3/uL — AB (ref 4.0–10.5)

## 2017-09-15 LAB — COMPREHENSIVE METABOLIC PANEL
ALBUMIN: 2.8 g/dL — AB (ref 3.5–5.0)
ALT: 27 U/L (ref 17–63)
AST: 21 U/L (ref 15–41)
Alkaline Phosphatase: 90 U/L (ref 38–126)
Anion gap: 9 (ref 5–15)
BUN: 22 mg/dL — AB (ref 6–20)
CHLORIDE: 99 mmol/L — AB (ref 101–111)
CO2: 29 mmol/L (ref 22–32)
Calcium: 8.7 mg/dL — ABNORMAL LOW (ref 8.9–10.3)
Creatinine, Ser: 1.51 mg/dL — ABNORMAL HIGH (ref 0.61–1.24)
GFR calc Af Amer: 50 mL/min — ABNORMAL LOW (ref >60)
GFR, EST NON AFRICAN AMERICAN: 43 mL/min — AB (ref >60)
Glucose, Bld: 101 mg/dL — ABNORMAL HIGH (ref 65–99)
POTASSIUM: 3.6 mmol/L (ref 3.5–5.1)
SODIUM: 137 mmol/L (ref 135–145)
Total Bilirubin: 0.8 mg/dL (ref 0.3–1.2)
Total Protein: 6.3 g/dL — ABNORMAL LOW (ref 6.5–8.1)

## 2017-09-15 LAB — BRAIN NATRIURETIC PEPTIDE: B NATRIURETIC PEPTIDE 5: 57.6 pg/mL (ref 0.0–100.0)

## 2017-09-15 LAB — APTT: aPTT: 34 seconds (ref 24–36)

## 2017-09-15 LAB — PROTIME-INR
INR: 1.17
Prothrombin Time: 14.8 seconds (ref 11.4–15.2)

## 2017-09-15 LAB — TSH: TSH: 1.603 u[IU]/mL (ref 0.350–4.500)

## 2017-09-15 LAB — TROPONIN I

## 2017-09-15 MED ORDER — SODIUM CHLORIDE 0.9% FLUSH
3.0000 mL | Freq: Two times a day (BID) | INTRAVENOUS | Status: DC
Start: 2017-09-15 — End: 2017-09-19
  Administered 2017-09-15 – 2017-09-19 (×5): 3 mL via INTRAVENOUS

## 2017-09-15 MED ORDER — SIMVASTATIN 10 MG PO TABS: 20.0000 mg | ORAL_TABLET | Freq: Every day | ORAL | Status: DC

## 2017-09-15 MED ORDER — MOMETASONE FURO-FORMOTEROL FUM 200-5 MCG/ACT IN AERO
2.0000 | INHALATION_SPRAY | Freq: Two times a day (BID) | RESPIRATORY_TRACT | Status: DC
  Administered 2017-09-16 – 2017-09-17 (×2): 2 via RESPIRATORY_TRACT
  Filled 2017-09-15: qty 8.8

## 2017-09-15 MED ORDER — NITROGLYCERIN 0.4 MG SL SUBL: 0.4000 mg | SUBLINGUAL_TABLET | SUBLINGUAL | Status: DC | PRN

## 2017-09-15 MED ORDER — FINASTERIDE 5 MG PO TABS
5.0000 mg | ORAL_TABLET | Freq: Every day | ORAL | Status: DC
  Administered 2017-09-16 – 2017-09-19 (×4): 5 mg via ORAL
  Filled 2017-09-15 (×4): qty 1

## 2017-09-15 MED ORDER — DEXTROSE 5 % IV SOLN
1.0000 g | INTRAVENOUS | Status: DC
  Administered 2017-09-15 – 2017-09-18 (×4): 1 g via INTRAVENOUS
  Filled 2017-09-15 (×6): qty 10

## 2017-09-15 MED ORDER — AMIODARONE HCL 100 MG PO TABS
100.0000 mg | ORAL_TABLET | Freq: Two times a day (BID) | ORAL | Status: DC
  Administered 2017-09-15 – 2017-09-19 (×8): 100 mg via ORAL
  Filled 2017-09-15 (×8): qty 1

## 2017-09-15 MED ORDER — ACETAMINOPHEN 650 MG RE SUPP: 650.0000 mg | Freq: Four times a day (QID) | RECTAL | Status: DC | PRN

## 2017-09-15 MED ORDER — FUROSEMIDE 40 MG PO TABS
40.0000 mg | ORAL_TABLET | Freq: Every day | ORAL | Status: DC
  Administered 2017-09-16 – 2017-09-19 (×4): 40 mg via ORAL
  Filled 2017-09-15 (×4): qty 1

## 2017-09-15 MED ORDER — ACETAMINOPHEN 325 MG PO TABS
650.0000 mg | ORAL_TABLET | Freq: Four times a day (QID) | ORAL | Status: DC | PRN
  Administered 2017-09-15: 650 mg via ORAL
  Filled 2017-09-15: qty 2

## 2017-09-15 MED ORDER — ALBUTEROL SULFATE (2.5 MG/3ML) 0.083% IN NEBU
2.5000 mg | INHALATION_SOLUTION | Freq: Four times a day (QID) | RESPIRATORY_TRACT | Status: DC | PRN
Start: 2017-09-15 — End: 2017-09-19

## 2017-09-15 MED ORDER — BENZONATATE 100 MG PO CAPS: 100.0000 mg | ORAL_CAPSULE | Freq: Three times a day (TID) | ORAL | Status: DC | PRN

## 2017-09-15 MED ORDER — METHYLPREDNISOLONE SODIUM SUCC 125 MG IJ SOLR
80.0000 mg | Freq: Three times a day (TID) | INTRAMUSCULAR | Status: DC
  Administered 2017-09-15 – 2017-09-16 (×4): 80 mg via INTRAVENOUS
  Filled 2017-09-15 (×4): qty 2

## 2017-09-15 MED ORDER — TAMSULOSIN HCL 0.4 MG PO CAPS
0.4000 mg | ORAL_CAPSULE | Freq: Every day | ORAL | Status: DC
  Administered 2017-09-15 – 2017-09-18 (×4): 0.4 mg via ORAL
  Filled 2017-09-15 (×4): qty 1

## 2017-09-15 MED ORDER — ALBUTEROL SULFATE (2.5 MG/3ML) 0.083% IN NEBU: 3.0000 mL | INHALATION_SOLUTION | Freq: Four times a day (QID) | RESPIRATORY_TRACT | Status: DC | PRN

## 2017-09-15 MED ORDER — PANTOPRAZOLE SODIUM 40 MG PO TBEC
40.0000 mg | DELAYED_RELEASE_TABLET | Freq: Every day | ORAL | Status: DC
  Administered 2017-09-15 – 2017-09-19 (×4): 40 mg via ORAL
  Filled 2017-09-15 (×5): qty 1

## 2017-09-15 MED ORDER — MELATONIN 5 MG PO CAPS: 5.0000 mg | ORAL_CAPSULE | Freq: Every day | ORAL | Status: DC

## 2017-09-15 MED ORDER — PROPRANOLOL HCL ER 80 MG PO CP24: 80.0000 mg | ORAL_CAPSULE | Freq: Every day | ORAL | Status: DC

## 2017-09-15 MED ORDER — SACUBITRIL-VALSARTAN 24-26 MG PO TABS
1.0000 | ORAL_TABLET | Freq: Two times a day (BID) | ORAL | Status: DC
  Administered 2017-09-15 – 2017-09-19 (×8): 1 via ORAL
  Filled 2017-09-15 (×8): qty 1

## 2017-09-15 MED ORDER — LORATADINE 10 MG PO TABS
10.0000 mg | ORAL_TABLET | Freq: Every day | ORAL | Status: DC
  Administered 2017-09-16 – 2017-09-19 (×4): 10 mg via ORAL
  Filled 2017-09-15 (×4): qty 1

## 2017-09-15 MED ORDER — PRO-STAT SUGAR FREE PO LIQD
30.0000 mL | Freq: Two times a day (BID) | ORAL | Status: DC
  Administered 2017-09-15 – 2017-09-19 (×8): 30 mL via ORAL
  Filled 2017-09-15 (×7): qty 30

## 2017-09-15 MED ORDER — LEVOTHYROXINE SODIUM 125 MCG PO TABS
125.0000 ug | ORAL_TABLET | Freq: Every day | ORAL | Status: DC
  Administered 2017-09-16 – 2017-09-19 (×4): 125 ug via ORAL
  Filled 2017-09-15 (×4): qty 1

## 2017-09-15 MED ORDER — ADULT MULTIVITAMIN W/MINERALS CH
1.0000 | ORAL_TABLET | Freq: Every day | ORAL | Status: DC
  Administered 2017-09-16 – 2017-09-19 (×4): 1 via ORAL
  Filled 2017-09-15 (×4): qty 1

## 2017-09-15 MED ORDER — DEXTROSE 5 % IV SOLN
500.0000 mg | INTRAVENOUS | Status: DC
  Administered 2017-09-15 – 2017-09-17 (×3): 500 mg via INTRAVENOUS
  Filled 2017-09-15 (×3): qty 500

## 2017-09-15 MED ORDER — SODIUM CHLORIDE 0.9% FLUSH: 3.0000 mL | INTRAVENOUS | Status: DC | PRN

## 2017-09-15 MED ORDER — APIXABAN 5 MG PO TABS
5.0000 mg | ORAL_TABLET | Freq: Two times a day (BID) | ORAL | Status: DC
  Administered 2017-09-15 – 2017-09-19 (×8): 5 mg via ORAL
  Filled 2017-09-15 (×8): qty 1

## 2017-09-15 MED ORDER — SODIUM CHLORIDE 0.9 % IV SOLN: 250.0000 mL | INTRAVENOUS | Status: DC | PRN

## 2017-09-15 MED ORDER — VITAMIN D3 25 MCG (1000 UNIT) PO TABS
1000.0000 [IU] | ORAL_TABLET | Freq: Every day | ORAL | Status: DC
  Administered 2017-09-15 – 2017-09-19 (×5): 1000 [IU] via ORAL
  Filled 2017-09-15 (×5): qty 1

## 2017-09-15 MED ORDER — PROMETHAZINE HCL 25 MG PO TABS
25.0000 mg | ORAL_TABLET | Freq: Every day | ORAL | Status: DC | PRN
  Administered 2017-09-15: 25 mg via ORAL
  Filled 2017-09-15: qty 1

## 2017-09-15 NOTE — H&P (Signed)
H&P   Patient Demographics:    Corey Tyler, is a 76 y.o. male  MRN: [id number]   DOB - 12-Nov-1941  Admit Date - 09/15/2017  Outpatient Primary MD for the patient is [name], MD  Referring MD/NP/PA:    Outpatient Specialists:  Lazarus Salines Clance   Patient coming from: home  No chief complaint on file. Hypoxia    HPI:    Corey Tyler  is a 76 y.o. male, w Copd on home o2, CHF (EF 25-30%) , Pafib who presents with hypoxia.  Pt states pox 72% on RA,  Feels weak. Slight dry cough. + dyspnea x 1-2 weeks.  " felt like my lungs were full"  Pt was seen in ED on 10/12 and sent home but his dyspnea continued and came to pcp office today .  Lying down makes his breathing better. Exertion worse.  Slight wheezing.  Pt denies fever, chills, cp, palp, n/v, diarrhea, brbpr.  Pox in office on 3L o2 [place]  89%.  Pt sent for direct admission for Copd exacerbation.     Review of systems:    In addition to the HPI above, No Fever-chills, No Headache, No changes with Vision or hearing, No problems swallowing food or Liquids, No Chest pain, No Abdominal pain, No Nausea or Vommitting, Bowel movements are regular, No Blood in stool or Urine, No dysuria, No new skin rashes or bruises, No new joints pains-aches,  No new weakness, tingling, numbness in any extremity, No recent weight gain or loss, No polyuria, polydypsia or polyphagia, No significant Mental Stressors.  A full 10 point Review of Systems was done, except as stated above, all other Review of Systems were negative.   With Past History of the following :    Past Medical History:  Diagnosis Date  . A-fib ([place])    a. Noted on 12/2012 interrogation, placed on Apixaban and ultimately stopped NOACs 2/2 personal decision 8/14.  Marland Kitchen AICD (automatic cardioverter/defibrillator) present    Cardiac defibrillator -dual  [facility]  .  Arthritis   . Atrial tachycardia-non sustained    a. Noted on 03/2012 interrogation.  . Chronic systolic heart failure (HCC)    EF down to 20 to 25% per echo November 2012  . COPD (chronic obstructive pulmonary disease) ([place])   . Hyperlipidemia   . Hypertension   . ICD (implantable cardiac defibrillator) in place   . NICM (nonischemic cardiomyopathy) ([place])    a. Normal cors 2000. b. Minimal plaque 2013.  . Old myocardial infarction    a. ?Silent MI. b. Large fixed inferolateral defect c/w with prior infarct, no ischemia. c. Cath in 2000/2013 with only minimal CAD.  Marland Kitchen Pulmonary nodule, right    Last scan in 2010 showing stability; felt to be benign.  Marland Kitchen PVC's (premature ventricular contractions)   . Ventricular tachycardia, polymorphic ([place])    Rx via ICD 12/14  Past Surgical History:  Procedure Laterality Date  . APPENDECTOMY    . CARDIAC CATHETERIZATION  2000  . CARDIAC DEFIBRILLATOR PLACEMENT    . CARDIOVERSION  03/30/2013   Procedure: CARDIOVERSION;  Surgeon: Thayer Headings, MD;  Location: [facility];  Service: Cardiovascular;;  . COLON SURGERY    . COLONOSCOPY N/A 09/25/2013   Procedure: COLONOSCOPY;  Surgeon: Jerene Bears, MD;  Location: [facility] END[facility];  Service: Endoscopy;  Laterality: N/A;  . ICD  12/2011   [name]  . IMPLANTABLE CARDIOVERTER DEFIBRILLATOR IMPLANT N/A 01/06/2012   Procedure: IMPLANTABLE CARDIOVERTER DEFIBRILLATOR IMPLANT;  Surgeon: Deboraha Sprang, MD;  Location: [facility] CATH LAB;  Service: Cardiovascular;  Laterality: N/A;  . TEE WITHOUT CARDIOVERSION N/A 03/30/2013   Procedure: TRANSESOPHAGEAL ECHOCARDIOGRAM (TEE);  Surgeon: Thayer Headings, MD;  Location: [facility];  Service: Cardiovascular;  Laterality: N/A;  . V TACH ABLATION  06/10/2017  . V TACH ABLATION N/A 06/10/2017   Procedure: V Tach Ablation;  Surgeon: Evans Lance, MD;  Location: [facility] CV LAB;  Service: Cardiovascular;  Laterality: N/A;      Social History:     Social History   Substance Use Topics  . Smoking status: Former Smoker    Packs/day: 2.50    Years: 40.00    Types: Cigarettes    Quit date: 11/24/1983  . Smokeless tobacco: Never Used     Comment: 2 1/2 ppd x 40 years  . Alcohol use No     Lives -  At home Mobility -  Walks by self   Family History :     Family History  Problem Relation Age of Onset  . Emphysema Mother   . Heart disease Mother        AVR  . Heart failure Father        Died agwe 48    Home Medications:   Prior to Admission medications   Medication Sig Start Date End Date Taking? Authorizing Provider  albuterol (PROVENTIL) (2.5 MG/3ML) 0.083% nebulizer solution Take 3 mLs (2.5 mg total) by nebulization every 6 (six) hours as needed for wheezing or shortness of breath. 01/14/15   Elsie Stain, MD  Albuterol Sulfate (PROAIR RESPICLICK) 308 (90 Base) MCG/ACT AEPB Inhale 2 puffs into the lungs every 6 (six) hours as needed (shortness of breath/ wheezing).    [provider]  amiodarone (PACERONE) 200 MG tablet Take 1 tablet (200 mg total) by mouth daily. 400mg  by mouth twice daily x 2 weeks, 400mg  by mouth once daily x 2 weeks and then resume 200mg  by mouth daily. 07/05/17   Deboraha Sprang, MD  Carboxymethylcellul-Glycerin (LUBRICATING EYE DROPS OP) Apply 1 drop to eye daily as needed (dry eyes).    [provider]  cholecalciferol (VITAMIN D) 1000 UNITS tablet Take 1,000 Units by mouth daily.    [provider]  doxycycline (ADOXA) 100 MG tablet Take 100 mg by mouth 2 (two) times daily.    [provider]  ELIQUIS 5 MG TABS tablet TAKE 1 TABLET BY MOUTH TWICE A DAY 07/30/15   Minus Breeding, MD  finasteride (PROSCAR) 5 MG tablet Take 5 mg by mouth daily.     [provider]  Fluticasone-Salmeterol (ADVAIR) 250-50 MCG/DOSE AEPB Inhale 1 puff into the lungs 2 (two) times daily.     [provider]  furosemide (LASIX) 40 MG tablet TAKE ONE TABLET BY MOUTH ONCE DAILY 09/23/16    Minus Breeding, MD  levothyroxine (SYNTHROID, LEVOTHROID) 125  MCG tablet Take 125 mcg by mouth daily. 06/15/17   [provider]  loratadine (CLARITIN) 10 MG tablet Take 10 mg by mouth daily.    [provider]  Melatonin 5 MG CAPS Take 5 mg by mouth at bedtime.     [provider]  Multiple Vitamin (MULTIVITAMIN WITH MINERALS) TABS tablet Take 1 tablet by mouth daily.    [provider]  nitroGLYCERIN (NITROSTAT) 0.4 MG SL tablet Place 0.4 mg under the tongue every 5 (five) minutes as needed for chest pain.  11/12/11   Burtis Junes, NP  OXYGEN Inhale 2 L into the lungs See admin instructions. Use oxygen whenever resting    [provider]  pantoprazole (PROTONIX) 40 MG tablet Take 1 tablet (40 mg total) by mouth daily. 07/21/16   Esterwood, Amy S, PA-C  POTASSIUM PO Take 1 tablet by mouth at bedtime.    [provider]  PRAVASTATIN SODIUM PO Take 1 tablet by mouth daily.    [provider]  predniSONE (STERAPRED UNI-PAK 21 TAB) 10 MG (21) TBPK tablet Take by mouth daily.    [provider]  promethazine (PHENERGAN) 25 MG tablet Take 25 mg by mouth daily as needed for nausea or vomiting.  04/20/17   [provider]  propranolol ER (INDERAL LA) 80 MG 24 hr capsule Take 1 capsule (80 mg total) by mouth at bedtime. 03/26/17   Deboraha Sprang, MD  Psyllium (METAMUCIL PO) Take 1 Dose by mouth daily. Mix in liquid and drink     [provider]  sacubitril-valsartan (ENTRESTO) 24-26 MG Take 1 tablet by mouth 2 (two) times daily. 03/26/17   Deboraha Sprang, MD  Tamsulosin HCl (FLOMAX) 0.4 MG CAPS Take 0.4 mg by mouth at bedtime.     [provider]     Allergies:     Allergies  Allergen Reactions  . Xarelto [Rivaroxaban] Other (See Comments)    Bleeding  . Adhesive [Tape] Hives, Itching and Rash  . Atorvastatin Cough  . Codeine     nauseated  . Isosorbide Nitrate Other (See Comments)    Made him  feel bad  . Latex Itching and Other (See Comments)    Rash, itching, burning  . Levofloxacin Other (See Comments)    Tachycardia  . Lisinopril Cough  . Lorazepam Other (See Comments)    "felt bad"  . Sertraline Other (See Comments)    Made him feel bad  . Tiotropium Bromide Monohydrate Swelling    Throat swelling     Physical Exam:   Vitals  Blood pressure (!) 108/57, pulse 67, temperature 97.6 F (36.4 C), temperature source Oral, resp. rate 18, SpO2 93 %.   1. General  lying in bed in NAD,    2. Normal affect and insight, Not Suicidal or Homicidal, Awake Alert, Oriented X 3.  3. No F.N deficits, ALL C.Nerves Intact, Strength 5/5 all 4 extremities, Sensation intact all 4 extremities, Plantars down going.  4. Ears and Eyes appear Normal, Conjunctivae clear, PERRLA. Moist Oral Mucosa.  5. Supple Neck, No JVD, No cervical lymphadenopathy appriciated, No Carotid Bruits.  6. Symmetrical Chest wall movement, Good air movement bilaterally, slight crackles at bilateral bases, minimal wheezing  7. RRR, No Gallops, Rubs or Murmurs, No Parasternal Heave.  8. Positive Bowel Sounds, Abdomen Soft, No tenderness, No organomegaly appriciated,No rebound -guarding or rigidity.  9.  No Cyanosis, Normal Skin Turgor, No Skin Rash or Bruise.  10. Good muscle  tone,  joints appear normal , no effusions, Normal ROM.  11. No Palpable Lymph Nodes in Neck or Axillae     Data Review:    CBC No results for input(s): WBC, HGB, HCT, PLT, MCV, MCH, MCHC, RDW, LYMPHSABS, MONOABS, EOSABS, BASOSABS, BANDABS in the last 168 hours.  Invalid input(s): NEUTRABS, BANDSABD ------------------------------------------------------------------------------------------------------------------  Chemistries  No results for input(s): NA, K, CL, CO2, GLUCOSE, BUN, CREATININE, CALCIUM, MG, AST, ALT, ALKPHOS, BILITOT in the last 168 hours.  Invalid input(s):  GFRCGP ------------------------------------------------------------------------------------------------------------------ estimated creatinine clearance is 46.9 mL/min (A) (by C-G formula based on SCr of 1.49 mg/dL (H)). ------------------------------------------------------------------------------------------------------------------ No results for input(s): TSH, T4TOTAL, T3FREE, THYROIDAB in the last 72 hours.  Invalid input(s): FREET3  Coagulation profile No results for input(s): INR, PROTIME in the last 168 hours. ------------------------------------------------------------------------------------------------------------------- No results for input(s): DDIMER in the last 72 hours. -------------------------------------------------------------------------------------------------------------------  Cardiac Enzymes No results for input(s): CKMB, TROPONINI, MYOGLOBIN in the last 168 hours.  Invalid input(s): CK ------------------------------------------------------------------------------------------------------------------    Component Value Date/Time   BNP 158.2 (H) 09/03/2017 1745   BNP 52.8 12/25/2014 1343     ---------------------------------------------------------------------------------------------------------------  Urinalysis    Component Value Date/Time   COLORURINE YELLOW 07/06/2016 1626   APPEARANCEUR CLEAR 07/06/2016 1626   LABSPEC 1.016 07/06/2016 1626   PHURINE 6.0 07/06/2016 1626   GLUCOSEU NEGATIVE 07/06/2016 1626   HGBUR NEGATIVE 07/06/2016 1626   BILIRUBINUR NEGATIVE 07/06/2016 1626   KETONESUR NEGATIVE 07/06/2016 1626   PROTEINUR NEGATIVE 07/06/2016 1626   UROBILINOGEN 0.2 07/18/2015 1154   NITRITE NEGATIVE 07/06/2016 1626   LEUKOCYTESUR NEGATIVE 07/06/2016 1626    ----------------------------------------------------------------------------------------------------------------   Imaging Results:    No results found.   Assessment & Plan:     Principal Problem:   Acute respiratory failure (HCC) Active Problems:   COPD exacerbation (HCC)   Hypoxia Copd exacerbation vs CAP Resp pathogen panel Start on rocephin, zithromax iv Solumedrol 80mg  iv q8h Advair=> Dulera Cont albuterol   CHF Cont lasix  Pafib Cont amiodarone Cont Eliquis  Bph Cont proscar  Mild renal insufficiency Check cmp in am  Hypothyroidism Check tsh Cont levothyroxine          DVT Prophylaxis Eliquis   AM Labs Ordered, also please review Full Orders  Family Communication: Admission, patients condition and plan of care including tests being ordered have been discussed with the patient who indicate understanding and agree with the plan and Code Status.  Code Status FULL CODE  Likely DC to  home  Condition GUARDED    Consults called: none  Admission status: inpatient   Time spent in minutes : 45   [name] M.D on 09/15/2017 at 3:54 PM  Between 7am to 7am - Pager - [phone]

## 2017-09-15 NOTE — Progress Notes (Signed)
PHARMACIST - PHYSICIAN ORDER COMMUNICATION  CONCERNING: P&T Medication Policy on Herbal Medications  DESCRIPTION:  This patient's order for:  Melatonin  has been noted.  This product(s) is classified as an "herbal" or natural product. Due to a lack of definitive safety studies or FDA approval, nonstandard manufacturing practices, plus the potential risk of unknown drug-drug interactions while on inpatient medications, the Pharmacy and Therapeutics Committee does not permit the use of "herbal" or natural products of this type within [facility].   ACTION TAKEN: The pharmacy department is unable to verify this order at this time and the order has been discontinued. Please reevaluate patient's clinical condition at discharge and address if the herbal or natural product(s) should be resumed at that time.  Royetta Asal, PharmD, BCPS Pager [phone] 09/15/2017 5:35 PM

## 2017-09-16 ENCOUNTER — Encounter (HOSPITAL_COMMUNITY): Payer: Self-pay | Admitting: *Deleted

## 2017-09-16 ENCOUNTER — Inpatient Hospital Stay (HOSPITAL_COMMUNITY): Payer: PPO

## 2017-09-16 DIAGNOSIS — I340 Nonrheumatic mitral (valve) insufficiency: Secondary | ICD-10-CM

## 2017-09-16 LAB — RESPIRATORY PANEL BY PCR
ADENOVIRUS-RVPPCR: NOT DETECTED
Bordetella pertussis: NOT DETECTED
CORONAVIRUS HKU1-RVPPCR: NOT DETECTED
CORONAVIRUS NL63-RVPPCR: NOT DETECTED
Chlamydophila pneumoniae: NOT DETECTED
Coronavirus 229E: NOT DETECTED
Coronavirus OC43: NOT DETECTED
Influenza A: NOT DETECTED
Influenza B: NOT DETECTED
METAPNEUMOVIRUS-RVPPCR: NOT DETECTED
Mycoplasma pneumoniae: NOT DETECTED
PARAINFLUENZA VIRUS 1-RVPPCR: NOT DETECTED
PARAINFLUENZA VIRUS 2-RVPPCR: NOT DETECTED
PARAINFLUENZA VIRUS 3-RVPPCR: NOT DETECTED
Parainfluenza Virus 4: NOT DETECTED
RHINOVIRUS / ENTEROVIRUS - RVPPCR: NOT DETECTED
Respiratory Syncytial Virus: NOT DETECTED

## 2017-09-16 LAB — ECHOCARDIOGRAM COMPLETE: Weight: 3280.44 oz

## 2017-09-16 LAB — COMPREHENSIVE METABOLIC PANEL
ALT: 29 U/L (ref 17–63)
AST: 22 U/L (ref 15–41)
Albumin: 3 g/dL — ABNORMAL LOW (ref 3.5–5.0)
Alkaline Phosphatase: 99 U/L (ref 38–126)
Anion gap: 11 (ref 5–15)
BILIRUBIN TOTAL: 0.6 mg/dL (ref 0.3–1.2)
BUN: 25 mg/dL — ABNORMAL HIGH (ref 6–20)
CHLORIDE: 100 mmol/L — AB (ref 101–111)
CO2: 25 mmol/L (ref 22–32)
CREATININE: 1.16 mg/dL (ref 0.61–1.24)
Calcium: 8.6 mg/dL — ABNORMAL LOW (ref 8.9–10.3)
GFR, EST NON AFRICAN AMERICAN: 59 mL/min — AB (ref >60)
Glucose, Bld: 154 mg/dL — ABNORMAL HIGH (ref 65–99)
POTASSIUM: 4 mmol/L (ref 3.5–5.1)
Sodium: 136 mmol/L (ref 135–145)
Total Protein: 6.9 g/dL (ref 6.5–8.1)

## 2017-09-16 LAB — CBC
HEMATOCRIT: 44.3 % (ref 39.0–52.0)
Hemoglobin: 14.8 g/dL (ref 13.0–17.0)
MCH: 31.3 pg (ref 26.0–34.0)
MCHC: 33.4 g/dL (ref 30.0–36.0)
MCV: 93.7 fL (ref 78.0–100.0)
PLATELETS: 242 10*3/uL (ref 150–400)
RBC: 4.73 MIL/uL (ref 4.22–5.81)
RDW: 14 % (ref 11.5–15.5)
WBC: 7 10*3/uL (ref 4.0–10.5)

## 2017-09-16 MED ORDER — METHYLPREDNISOLONE SODIUM SUCC 40 MG IJ SOLR
40.0000 mg | Freq: Three times a day (TID) | INTRAMUSCULAR | Status: DC
  Administered 2017-09-16 – 2017-09-17 (×4): 40 mg via INTRAVENOUS
  Filled 2017-09-16 (×4): qty 1

## 2017-09-16 NOTE — Progress Notes (Signed)
  Echocardiogram 2D Echocardiogram has been performed.  Corey Tyler L Androw 09/16/2017, 12:46 PM

## 2017-09-16 NOTE — Progress Notes (Signed)
Patient ID: Corey Tyler, male   DOB: May 26, 1941, 76 y.o.   MRN: [id number]                                                                PROGRESS NOTE                                                                                                                                                                                                             Patient Demographics:    Corey Tyler, is a 76 y.o. male, DOB - 1941-08-16, MHD:[id number]  Admit date - 09/15/2017   Admitting Physician [name], MD  Outpatient Primary MD for the patient is [name], MD  LOS - 1  Outpatient Specialists:    No chief complaint on file. dyspnea, hypoxia     Brief Narrative 76 y.o. male, w Copd on home o2, CHF (EF 25-30%) , Pafib who presents with hypoxia.  Pt states pox 72% on RA,  Feels weak. Slight dry cough. + dyspnea x 1-2 weeks.  " felt like my lungs were full"  Pt was seen in ED on 10/12 and sent home but his dyspnea continued and came to pcp office today .  Lying down makes his breathing better. Exertion worse.  Slight wheezing.  Pt denies fever, chills, cp, palp, n/v, diarrhea, brbpr.  Pox in office on 3L o2 [place]  89%.  Pt sent for direct admission for Copd exacerbation.     Subjective:    Corey Tyler today states that his breathing is slightly better.  Afebrile.  Slight wheezing  No headache, No chest pain, No abdominal pain - No Nausea, No new weakness tingling or numbness, No Cough - SOB.   Assessment  & Plan :    Principal Problem:   Acute respiratory failure (HCC) Active Problems:   COPD exacerbation (HCC)   Hypoxia Copd exacerbation vs CAP Resp pathogen panel Cont rocephin, zithromax iv Solumedrol 80mg  iv q8h Advair=> Dulera Cont albuterol   CHF Cont lasix  Pafib Cont amiodarone Cont Eliquis  Bph Cont proscar  Mild renal insufficiency Check cmp in am  Hypothyroidism Check tsh Cont levothyroxine  Code Status :  FULL CODE  Family Communication  : w  patient  Disposition Plan  : home  Barriers For Discharge :  Consults  :  none  Procedures  : none  DVT Prophylaxis  :  Lovenox - SCDs   Lab Results  Component Value Date   PLT 242 09/16/2017    Antibiotics  :  Rocephin , zithromax  Anti-infectives    Start     Dose/Rate Route Frequency Ordered Stop   09/15/17 2200  azithromycin (ZITHROMAX) 500 mg in dextrose 5 % 250 mL IVPB     500 mg 250 mL/hr over 60 Minutes Intravenous Every 24 hours 09/15/17 2011     09/15/17 2100  cefTRIAXone (ROCEPHIN) 1 g in dextrose 5 % 50 mL IVPB     1 g 100 mL/hr over 30 Minutes Intravenous Every 24 hours 09/15/17 2011          Objective:   Vitals:   09/15/17 2138 09/16/17 0500 09/16/17 0808 09/16/17 0816  BP: 117/65 119/77    Pulse: 65 65    Resp: 20 20    Temp: 97.8 F (36.6 C) 97.7 F (36.5 C)    TempSrc: Oral Oral    SpO2: (!) 89% (!) 89% 92% 92%  Weight:  93 kg (205 lb 0.4 oz)      Wt Readings from Last 3 Encounters:  09/16/17 93 kg (205 lb 0.4 oz)  09/03/17 95.3 kg (210 lb)  07/13/17 94.3 kg (207 lb 12.8 oz)     Intake/Output Summary (Last 24 hours) at 09/16/17 0823 Last data filed at 09/16/17 0600  Gross per 24 hour  Intake              300 ml  Output                0 ml  Net              300 ml     Physical Exam  Awake Alert, Oriented X 3, No new F.N deficits, Normal affect [place].AT,PERRAL Supple Neck,No JVD, No cervical lymphadenopathy appriciated.  Symmetrical Chest wall movement, slight decrease in bs at the bases, trace crackle at the left lung base, moving air better.  RRR,No Gallops,Rubs or new Murmurs, No Parasternal Heave +ve B.Sounds, Abd Soft, No tenderness, No organomegaly appriciated, No rebound - guarding or rigidity. No Cyanosis, Clubbing or edema, No new Rash or bruise      Data Review:    CBC  Recent Labs Lab 09/15/17 1626 09/16/17 0527  WBC 10.8* 7.0  HGB 13.9 14.8  HCT 41.0 44.3  PLT 241 242  MCV 94.5 93.7  MCH 32.0 31.3  MCHC  33.9 33.4  RDW 14.1 14.0  LYMPHSABS 1.8  --   MONOABS 1.0  --   EOSABS 0.3  --   BASOSABS 0.0  --     Chemistries   Recent Labs Lab 09/15/17 1626 09/16/17 0527  NA 137 136  K 3.6 4.0  CL 99* 100*  CO2 29 25  GLUCOSE 101* 154*  BUN 22* 25*  CREATININE 1.51* 1.16  CALCIUM 8.7* 8.6*  AST 21 22  ALT 27 29  ALKPHOS 90 99  BILITOT 0.8 0.6   ------------------------------------------------------------------------------------------------------------------ No results for input(s): CHOL, HDL, LDLCALC, TRIG, CHOLHDL, LDLDIRECT in the last 72 hours.  Lab Results  Component Value Date   HGBA1C 6.0 (H) 03/27/2013   ------------------------------------------------------------------------------------------------------------------  Recent Labs  09/15/17 1626  TSH 1.603   ------------------------------------------------------------------------------------------------------------------ No results for input(s): VITAMINB12, FOLATE, FERRITIN, TIBC, IRON, RETICCTPCT in the last 72 hours.  Coagulation profile  Recent Labs Lab 09/15/17 1626  INR 1.17    No results for input(s): DDIMER in the last 72 hours.  Cardiac Enzymes  Recent Labs Lab 09/15/17 1626  TROPONINI <0.03   ------------------------------------------------------------------------------------------------------------------    Component Value Date/Time   BNP 57.6 09/15/2017 1626   BNP 52.8 12/25/2014 1343    Inpatient Medications  Scheduled Meds: . amiodarone  100 mg Oral BID  . apixaban  5 mg Oral BID  . cholecalciferol  1,000 Units Oral Daily  . feeding supplement (PRO-STAT SUGAR FREE 64)  30 mL Oral BID  . finasteride  5 mg Oral Daily  . furosemide  40 mg Oral Daily  . levothyroxine  125 mcg Oral QAC breakfast  . loratadine  10 mg Oral Daily  . methylPREDNISolone (SOLU-MEDROL) injection  80 mg Intravenous Q8H  . mometasone-formoterol  2 puff Inhalation BID  . multivitamin with minerals  1 tablet  Oral Daily  . pantoprazole  40 mg Oral Daily  . sacubitril-valsartan  1 tablet Oral BID  . sodium chloride flush  3 mL Intravenous Q12H  . tamsulosin  0.4 mg Oral QHS   Continuous Infusions: . sodium chloride    . azithromycin 500 mg (09/15/17 2157)  . cefTRIAXone (ROCEPHIN)  IV 1 g (09/15/17 2100)   PRN Meds:.sodium chloride, acetaminophen **OR** acetaminophen, albuterol, albuterol, benzonatate, nitroGLYCERIN, promethazine, sodium chloride flush  Micro Results Recent Results (from the past 240 hour(s))  Respiratory Panel by PCR     Status: None   Collection Time: 09/15/17  8:19 PM  Result Value Ref Range Status   Adenovirus NOT DETECTED NOT DETECTED Final   Coronavirus 229E NOT DETECTED NOT DETECTED Final   Coronavirus HKU1 NOT DETECTED NOT DETECTED Final   Coronavirus NL63 NOT DETECTED NOT DETECTED Final   Coronavirus OC43 NOT DETECTED NOT DETECTED Final   Metapneumovirus NOT DETECTED NOT DETECTED Final   Rhinovirus / Enterovirus NOT DETECTED NOT DETECTED Final   Influenza A NOT DETECTED NOT DETECTED Final   Influenza B NOT DETECTED NOT DETECTED Final   Parainfluenza Virus 1 NOT DETECTED NOT DETECTED Final   Parainfluenza Virus 2 NOT DETECTED NOT DETECTED Final   Parainfluenza Virus 3 NOT DETECTED NOT DETECTED Final   Parainfluenza Virus 4 NOT DETECTED NOT DETECTED Final   Respiratory Syncytial Virus NOT DETECTED NOT DETECTED Final   Bordetella pertussis NOT DETECTED NOT DETECTED Final   Chlamydophila pneumoniae NOT DETECTED NOT DETECTED Final   Mycoplasma pneumoniae NOT DETECTED NOT DETECTED Final    Comment: Performed at [facility], [place] [address], [place], [place] [address]    Radiology Reports Dg Chest 2 View  Result Date: 09/15/2017 CLINICAL DATA:  Shortness of breath and congestion for 2 weeks EXAM: CHEST  2 VIEW COMPARISON:  09/03/2017 FINDINGS: Cardiac shadow is stable. Defibrillator is again noted. Mild interstitial changes are seen with basilar scarring.  No focal infiltrate is noted. Very mild interstitial edema is present. No acute bony abnormality is seen. IMPRESSION: Mild interstitial edema. Chronic changes in the bases bilaterally. Electronically Signed   By: Inez Catalina M.D.   On: 09/15/2017 19:59   Dg Chest 2 View  Result Date: 09/03/2017 CLINICAL DATA:  Cough and congestion and patient with history of COPD. EXAM: CHEST  2 VIEW COMPARISON:  PA and lateral chest 11/08/2016 and 04/08/2016. FINDINGS: The lungs are emphysematous with basilar scar, more notable on the left. The appearance is unchanged. No pneumothorax or pleural effusion. Heart size is normal. Aortic atherosclerosis is noted. Paced device in place.  No acute bony abnormality. IMPRESSION: COPD without acute disease. Electronically Signed   By: Inge Rise M.D.   On: 09/03/2017 17:51    Time Spent in minutes  30   [name] M.D on 09/16/2017 at 8:23 AM  Between 7am to 7am - Pager - [phone]

## 2017-09-16 NOTE — Care Management Note (Signed)
Case Management Note  Patient Details  Name: Corey Tyler MRN: [id number] Date of Birth: 08-01-1941  Subjective/Objective:                  hypoxia  Action/Plan: Date:  September 16, 2017 Chart reviewed for concurrent status and case management needs.  Will continue to follow patient progress.  Discharge Planning: following for needs  Expected discharge date: [id number]  Velva Harman, BSN, [place], [place]   Expected Discharge Date:                  Expected Discharge Plan:  Home/Self Care  In-House Referral:     Discharge planning Services  CM Consult  Post Acute Care Choice:    Choice offered to:     DME Arranged:    DME Agency:     HH Arranged:    HH Agency:     Status of Service:  In process, will continue to follow  If discussed at Long Length of Stay Meetings, dates discussed:    Additional Comments:  Leeroy Cha, RN 09/16/2017, 8:49 AM

## 2017-09-17 DIAGNOSIS — I2510 Atherosclerotic heart disease of native coronary artery without angina pectoris: Secondary | ICD-10-CM

## 2017-09-17 DIAGNOSIS — J9621 Acute and chronic respiratory failure with hypoxia: Principal | ICD-10-CM

## 2017-09-17 DIAGNOSIS — I472 Ventricular tachycardia: Secondary | ICD-10-CM

## 2017-09-17 DIAGNOSIS — I5042 Chronic combined systolic (congestive) and diastolic (congestive) heart failure: Secondary | ICD-10-CM

## 2017-09-17 DIAGNOSIS — Z95810 Presence of automatic (implantable) cardiac defibrillator: Secondary | ICD-10-CM

## 2017-09-17 DIAGNOSIS — J441 Chronic obstructive pulmonary disease with (acute) exacerbation: Secondary | ICD-10-CM

## 2017-09-17 DIAGNOSIS — I428 Other cardiomyopathies: Secondary | ICD-10-CM

## 2017-09-17 DIAGNOSIS — J9601 Acute respiratory failure with hypoxia: Secondary | ICD-10-CM

## 2017-09-17 LAB — COMPREHENSIVE METABOLIC PANEL
ALBUMIN: 2.7 g/dL — AB (ref 3.5–5.0)
ALT: 25 U/L (ref 17–63)
AST: 19 U/L (ref 15–41)
Alkaline Phosphatase: 79 U/L (ref 38–126)
Anion gap: 11 (ref 5–15)
BILIRUBIN TOTAL: 0.7 mg/dL (ref 0.3–1.2)
BUN: 25 mg/dL — AB (ref 6–20)
CHLORIDE: 98 mmol/L — AB (ref 101–111)
CO2: 29 mmol/L (ref 22–32)
Calcium: 8.8 mg/dL — ABNORMAL LOW (ref 8.9–10.3)
Creatinine, Ser: 1.07 mg/dL (ref 0.61–1.24)
GFR calc Af Amer: >60 mL/min (ref >60)
GFR calc non Af Amer: >60 mL/min (ref >60)
GLUCOSE: 146 mg/dL — AB (ref 65–99)
POTASSIUM: 4.9 mmol/L (ref 3.5–5.1)
Sodium: 138 mmol/L (ref 135–145)
TOTAL PROTEIN: 6.2 g/dL — AB (ref 6.5–8.1)

## 2017-09-17 LAB — CBC
HEMATOCRIT: 40.8 % (ref 39.0–52.0)
Hemoglobin: 13.6 g/dL (ref 13.0–17.0)
MCH: 31.6 pg (ref 26.0–34.0)
MCHC: 33.3 g/dL (ref 30.0–36.0)
MCV: 94.9 fL (ref 78.0–100.0)
Platelets: 236 10*3/uL (ref 150–400)
RBC: 4.3 MIL/uL (ref 4.22–5.81)
RDW: 14.1 % (ref 11.5–15.5)
WBC: 15 10*3/uL — AB (ref 4.0–10.5)

## 2017-09-17 LAB — TROPONIN I
Troponin I: <0.03 ng/mL (ref <0.03)
Troponin I: <0.03 ng/mL (ref <0.03)

## 2017-09-17 MED ORDER — METOPROLOL TARTRATE 12.5 MG HALF TABLET
12.5000 mg | ORAL_TABLET | Freq: Two times a day (BID) | ORAL | Status: DC
  Administered 2017-09-17: 12.5 mg via ORAL
  Filled 2017-09-17: qty 1

## 2017-09-17 MED ORDER — PSYLLIUM 95 % PO PACK
1.0000 | PACK | Freq: Every day | ORAL | Status: DC
  Administered 2017-09-17 – 2017-09-18 (×2): 1 via ORAL
  Filled 2017-09-17 (×3): qty 1

## 2017-09-17 MED ORDER — PREDNISONE 50 MG PO TABS
60.0000 mg | ORAL_TABLET | Freq: Every day | ORAL | Status: DC
  Administered 2017-09-18: 60 mg via ORAL
  Filled 2017-09-17: qty 1

## 2017-09-17 MED ORDER — METOPROLOL TARTRATE 5 MG/5ML IV SOLN
5.0000 mg | Freq: Once | INTRAVENOUS | Status: AC
  Administered 2017-09-17: 5 mg via INTRAVENOUS
  Filled 2017-09-17: qty 5

## 2017-09-17 MED ORDER — POLYETHYLENE GLYCOL 3350 17 G PO PACK
17.0000 g | PACK | Freq: Every day | ORAL | Status: DC | PRN
  Administered 2017-09-17: 17 g via ORAL
  Filled 2017-09-17: qty 1

## 2017-09-17 NOTE — Progress Notes (Signed)
Pt apparently shaving and had tachycardia.  Tele 12 lead ekg Trop I q6h x3 Metoprolol 5mg  iv x1 Cardiology consulted appreciate input Will transfer to stepdown if needed.

## 2017-09-17 NOTE — Significant Event (Signed)
Rapid Response Event Note  Overview: Time Called: 1411 Arrival Time: 1415 Event Type: Cardiac  Initial Focused Assessment:  Rapid response called for patient due to rapid heart rate. Upon arrival patient not in distress resting in bed, slightly pale in color. 12 lead EKG performed and showed a wide QRS Ventricular tachycardia. Patient states feeling bad but no chest pain or tightness. Patient on O2 with stable oxygen saturation. MD had been notified by bedside RN, and Metoprolol push had been ordered. Patients wife at bedside stating that the patient had had 5 episodes like this in the past and that Amioderone typically converts him back into sinus rhythm. Metoprolol was pushed with no immediate reaction, then patient slows heart rate and converts to NSR, patient states feeling much better and he thought maybe his defibrillator had shocked him. MD called back and MD wanting to keep patient in tele for observation.  1419 107/82 (91)  HR  138, O2 93 1424 118/78 (90)  HR  147, O2 94 1429 107/70 (81)  HR   69,  O2  95  Interventions:  Metoprolol given as ordered. 12 lead EKG performed and repeat performed, both results called to MD.     [place]

## 2017-09-17 NOTE — Progress Notes (Signed)
Patient ID: Corey Tyler, male   DOB: Jul 14, 1941, 76 y.o.   MRN: [id number]                                                                PROGRESS NOTE                                                                                                                                                                                                             Patient Demographics:    Corey Tyler, is a 76 y.o. male, DOB - 1941-02-10, [id number]:[id number]  Admit date - 09/15/2017   Admitting Physician [name], MD  Outpatient Primary MD for the patient is [name], MD  LOS - 2  Outpatient Specialists:  No chief complaint on file. Copd excerbation     Brief Narrative  76 y.o.male,w Copd on home o2, CHF (EF 25-30%) , Pafib who presents with hypoxia. Pt states pox 72% on RA, Feels weak. Slight dry cough. + dyspnea x 1-2 weeks. " felt like my lungs were full" Pt was seen in ED on 10/12 and sent home but his dyspnea continued and came to pcp office today . Lying down makes his breathing better. Exertion worse. Slight wheezing. Pt denies fever, chills, cp, palp, n/v, diarrhea, brbpr. Pox in office on 3L o2 [place] 89%. Pt sent for direct admission for Copd exacerbation.    Subjective:    Kden [place]ag[place] today has been afebrile overnite.  Breathing improving,  Pox 89% on RA,  Prior to admission 72% on RA.     No headache, No chest pain, No abdominal pain - No Nausea, No new weakness tingling or numbness   Assessment  & Plan :    Principal Problem:   Acute respiratory failure (HCC) Active Problems:   COPD exacerbation (HCC)    Hypoxia Copd exacerbation vs CAP Resp pathogen panel Cont rocephin, zithromax iv Solumedrol 80mg  iv q8h=> prednisone 60mg  po qday later today Advair=> Dulera Cont albuterol   CHF Cont lasix  Pafib Cont amiodarone Cont Eliquis  Bph Cont proscar  Mild renal insufficiency Check cmp in am  Hypothyroidism Check tsh Cont levothyroxine  Code  Status :  FULL CODE  Family Communication  : w patient  Disposition Plan  : home  Barriers For Discharge :  Consults  :  none  Procedures  : none  DVT Prophylaxis  :  Lovenox - SCDs   Recent Labs       Lab Results  Component Value Date   PLT 242 09/16/2017      Antibiotics  :  Rocephin , zithromax      Anti-infectives    Start     Dose/Rate Route Frequency Ordered Stop   09/15/17 2200  azithromycin (ZITHROMAX) 500 mg in dextrose 5 % 250 mL IVPB     500 mg 250 mL/hr over 60 Minutes Intravenous Every 24 hours 09/15/17 2011     09/15/17 2100  cefTRIAXone (ROCEPHIN) 1 g in dextrose 5 % 50 mL IVPB     1 g 100 mL/hr over 30 Minutes Intravenous Every 24 hours 09/15/17 2011          Objective:   Vitals:   09/16/17 0924 09/16/17 1438 09/16/17 2038 09/17/17 0613  BP: 111/65 118/71 119/71 123/70  Pulse: 71 76 76 76  Resp: 19 19 (!) 22 20  Temp: 97.6 F (36.4 C) 98 F (36.7 C) 97.8 F (36.6 C) 97.6 F (36.4 C)  TempSrc: Oral Oral Oral Oral  SpO2: 90% 92% 91% (!) 89%  Weight:        Wt Readings from Last 3 Encounters:  09/16/17 93 kg (205 lb 0.4 oz)  09/03/17 95.3 kg (210 lb)  07/13/17 94.3 kg (207 lb 12.8 oz)     Intake/Output Summary (Last 24 hours) at 09/17/17 0835 Last data filed at 09/16/17 0900  Gross per 24 hour  Intake              360 ml  Output                0 ml  Net              360 ml     Physical Exam  Awake Alert, Oriented X 3, No new F.N deficits, Normal affect [place].AT,PERRAL Supple Neck,No JVD, No cervical lymphadenopathy appriciated.  Symmetrical Chest wall movement, Good air movement bilaterally, CTAB RRR,No Gallops,Rubs or new Murmurs, No Parasternal Heave +ve B.Sounds, Abd Soft, No tenderness, No organomegaly appriciated, No rebound - guarding or rigidity. No Cyanosis, Clubbing or edema, No new Rash or bruise      Data Review:    CBC  Recent Labs Lab 09/15/17 1626 09/16/17 0527 09/17/17 0551  WBC 10.8* 7.0  15.0*  HGB 13.9 14.8 13.6  HCT 41.0 44.3 40.8  PLT 241 242 236  MCV 94.5 93.7 94.9  MCH 32.0 31.3 31.6  MCHC 33.9 33.4 33.3  RDW 14.1 14.0 14.1  LYMPHSABS 1.8  --   --   MONOABS 1.0  --   --   EOSABS 0.3  --   --   BASOSABS 0.0  --   --     Chemistries   Recent Labs Lab 09/15/17 1626 09/16/17 0527 09/17/17 0551  NA 137 136 138  K 3.6 4.0 4.9  CL 99* 100* 98*  CO2 29 25 29   GLUCOSE 101* 154* 146*  BUN 22* 25* 25*  CREATININE 1.51* 1.16 1.07  CALCIUM 8.7* 8.6* 8.8*  AST 21 22 19   ALT 27 29 25   ALKPHOS 90 99 79  BILITOT 0.8 0.6 0.7   ------------------------------------------------------------------------------------------------------------------ No results for input(s): CHOL, HDL, LDLCALC, TRIG, CHOLHDL, LDLDIRECT in the last 72 hours.  Lab Results  Component Value Date   HGBA1C 6.0 (H) 03/27/2013   ------------------------------------------------------------------------------------------------------------------  Recent Labs  09/15/17 1626  TSH 1.603   ------------------------------------------------------------------------------------------------------------------ No results for input(s): VITAMINB12, FOLATE, FERRITIN, TIBC, IRON, RETICCTPCT in the last 72 hours.  Coagulation profile  Recent Labs Lab 09/15/17 1626  INR 1.17    No results for input(s): DDIMER in the last 72 hours.  Cardiac Enzymes  Recent Labs Lab 09/15/17 1626  TROPONINI <0.03   ------------------------------------------------------------------------------------------------------------------    Component Value Date/Time   BNP 57.6 09/15/2017 1626   BNP 52.8 12/25/2014 1343    Inpatient Medications  Scheduled Meds: . amiodarone  100 mg Oral BID  . apixaban  5 mg Oral BID  . cholecalciferol  1,000 Units Oral Daily  . feeding supplement (PRO-STAT SUGAR FREE 64)  30 mL Oral BID  . finasteride  5 mg Oral Daily  . furosemide  40 mg Oral Daily  . levothyroxine  125 mcg Oral  QAC breakfast  . loratadine  10 mg Oral Daily  . methylPREDNISolone (SOLU-MEDROL) injection  40 mg Intravenous Q8H  . mometasone-formoterol  2 puff Inhalation BID  . multivitamin with minerals  1 tablet Oral Daily  . pantoprazole  40 mg Oral Daily  . sacubitril-valsartan  1 tablet Oral BID  . sodium chloride flush  3 mL Intravenous Q12H  . tamsulosin  0.4 mg Oral QHS   Continuous Infusions: . sodium chloride    . azithromycin Stopped (09/16/17 2209)  . cefTRIAXone (ROCEPHIN)  IV Stopped (09/16/17 2051)   PRN Meds:.sodium chloride, acetaminophen **OR** acetaminophen, albuterol, albuterol, benzonatate, nitroGLYCERIN, promethazine, sodium chloride flush  Micro Results Recent Results (from the past 240 hour(s))  Respiratory Panel by PCR     Status: None   Collection Time: 09/15/17  8:19 PM  Result Value Ref Range Status   Adenovirus NOT DETECTED NOT DETECTED Final   Coronavirus 229E NOT DETECTED NOT DETECTED Final   Coronavirus HKU1 NOT DETECTED NOT DETECTED Final   Coronavirus NL63 NOT DETECTED NOT DETECTED Final   Coronavirus OC43 NOT DETECTED NOT DETECTED Final   Metapneumovirus NOT DETECTED NOT DETECTED Final   Rhinovirus / Enterovirus NOT DETECTED NOT DETECTED Final   Influenza A NOT DETECTED NOT DETECTED Final   Influenza B NOT DETECTED NOT DETECTED Final   Parainfluenza Virus 1 NOT DETECTED NOT DETECTED Final   Parainfluenza Virus 2 NOT DETECTED NOT DETECTED Final   Parainfluenza Virus 3 NOT DETECTED NOT DETECTED Final   Parainfluenza Virus 4 NOT DETECTED NOT DETECTED Final   Respiratory Syncytial Virus NOT DETECTED NOT DETECTED Final   Bordetella pertussis NOT DETECTED NOT DETECTED Final   Chlamydophila pneumoniae NOT DETECTED NOT DETECTED Final   Mycoplasma pneumoniae NOT DETECTED NOT DETECTED Final    Comment: Performed at [facility], [place] [address], [place], [place] [address]    Radiology Reports Dg Chest 2 View  Result Date: 09/15/2017 CLINICAL DATA:   Shortness of breath and congestion for 2 weeks EXAM: CHEST  2 VIEW COMPARISON:  09/03/2017 FINDINGS: Cardiac shadow is stable. Defibrillator is again noted. Mild interstitial changes are seen with basilar scarring. No focal infiltrate is noted. Very mild interstitial edema is present. No acute bony abnormality is seen. IMPRESSION: Mild interstitial edema. Chronic changes in the bases bilaterally. Electronically Signed   By: Inez Catalina M.D.   On: 09/15/2017 19:59   Dg Chest 2 View  Result Date: 09/03/2017 CLINICAL DATA:  Cough and congestion and patient with history of COPD. EXAM: CHEST  2 VIEW COMPARISON:  PA and lateral chest 11/08/2016 and 04/08/2016.  FINDINGS: The lungs are emphysematous with basilar scar, more notable on the left. The appearance is unchanged. No pneumothorax or pleural effusion. Heart size is normal. Aortic atherosclerosis is noted. Paced device in place. No acute bony abnormality. IMPRESSION: COPD without acute disease. Electronically Signed   By: Inge Rise M.D.   On: 09/03/2017 17:51    Time Spent in minutes  30   [name] M.D on 09/17/2017 at 8:35 AM  Between 7am to 7am - Pager - [phone]

## 2017-09-17 NOTE — Consult Note (Signed)
Cardiology Consultation:   Patient ID: Corey Tyler; [id number]; 31-Jan-1941   Admit date: 09/15/2017 Date of Consult: 09/17/2017  Primary Care Provider: [name], MD Primary Cardiologist: Hochrein Primary Electrophysiologist:  [name]   Patient Profile:   Corey Tyler is a 76 y.o. male with a hx of ventricular tachycardia, atrial fibrillation, nonischemic cardiomyopathy, minor nonobstructive CAD by angiography who is being seen today for the evaluation of symptomatic sustained ventricular tachycardia at the request of Kim.  History of Present Illness:   Corey Tyler was admitted for an acute respiratory infection associated with hypoxemia October 24 and was already improving.  Today while shaving he suddenly felt dizzy and nauseous and felt that his heart was racing.  He was not on telemetry at the time, but when he was placed on telemetry the monitor showed wide complex tachycardia with a ventricular rate of around 145 bpm.  It lasted for several more minutes and eventually resolved after intravenous metoprolol 5 mg.  Interrogation of his device shows that the episode of ventricular tachycardia lasted for over 30 minutes.  At onset the VT was monomorphic with a rate of 162 bpm (cycle length 370 ms).  This placed it in the VT 1 zone and the device exhausted all antitachycardia therapies unsuccessfully (runs of 8-beat bursts at 85% of cycle length).  Towards the end the rhythm did slow down to 145 bpm.  He has been using more of his rescue albuterol inhaler recently.  He is on long-acting beta agonists.  He did not have significant electrolyte abnormalities.  An old nuclear study shows evidence of previous scar, but coronary angiography has shown only minor atherosclerosis.  He is felt to have nonischemic cardiomyopathy.  Echocardiogram performed just yesterday shows LVEF of 45-50% basal inferior and inferolateral akinesis  He has a long-standing history of recurrent sustained ventricular  tachycardia and is on amiodarone therapy.  He underwent a partially successful EP/radiofrequency ablation procedure last July.  Not all of the VT reentry circuit could be treated.  He never wants to have an ablation again and found the experience very unnerving.  Is also worried about the potential side effects of amiodarone.  He has had problems with amiodarone-induced hypothyroidism, now appropriately treated.  He was also on beta-blockers in the past, but these have been discontinued.  He is not sure why.  His device is a Environmental health practitioner dual-chamber fortify assura with a Durata defibrillator lead implanted in 2013.  It was last checked on September 27 and showed normal device function.  This device has never delivered shock therapy.  It is programmed to a VT-1 zone zone at 126 bpm with ATP only, VT-2 therapy zone at 181 bpm with 3 sequences of ATP followed by shocks and a VF zone at 214 bpm with ATP during charging.  Fibrillation threshold is documented as being less than 15 J.  Device shows that on September 19 of this year he had a 20-hour episode of atrial fibrillation with controlled ventricular response.  Peak ventricular rate was only 115 bpm. His overall burden of atrial fibrillation since device implantation is only 1.4%.  Thoracic impedance is at baseline, without evidence of recent volume overload  Past Medical History:  Diagnosis Date  . A-fib ([place])    a. Noted on 12/2012 interrogation, placed on Apixaban and ultimately stopped NOACs 2/2 personal decision 8/14.  Marland Kitchen AICD (automatic cardioverter/defibrillator) present    Cardiac defibrillator -dual  [employer]  . Arthritis   . Atrial  tachycardia-non sustained    a. Noted on 03/2012 interrogation.  . Chronic systolic heart failure (HCC)    EF down to 20 to 25% per echo November 2012  . COPD (chronic obstructive pulmonary disease) ([place])   . Hyperlipidemia   . Hypertension   . ICD (implantable cardiac defibrillator) in place   . NICM (nonischemic  cardiomyopathy) ([place])    a. Normal cors 2000. b. Minimal plaque 2013.  . Old myocardial infarction    a. ?Silent MI. b. Large fixed inferolateral defect c/w with prior infarct, no ischemia. c. Cath in 2000/2013 with only minimal CAD.  Marland Kitchen Presence of permanent cardiac pacemaker   . Pulmonary nodule, right    Last scan in 2010 showing stability; felt to be benign.  Marland Kitchen PVC's (premature ventricular contractions)   . Ventricular tachycardia, polymorphic ([place])    Rx via ICD 12/14    Past Surgical History:  Procedure Laterality Date  . APPENDECTOMY    . CARDIAC CATHETERIZATION  2000  . CARDIAC DEFIBRILLATOR PLACEMENT    . CARDIOVERSION  03/30/2013   Procedure: CARDIOVERSION;  Surgeon: Thayer Headings, MD;  Location: [facility];  Service: Cardiovascular;;  . COLON SURGERY    . COLONOSCOPY N/A 09/25/2013   Procedure: COLONOSCOPY;  Surgeon: Jerene Bears, MD;  Location: [facility];  Service: Endoscopy;  Laterality: N/A;  . ICD  12/2011   [name]  . IMPLANTABLE CARDIOVERTER DEFIBRILLATOR IMPLANT N/A 01/06/2012   Procedure: IMPLANTABLE CARDIOVERTER DEFIBRILLATOR IMPLANT;  Surgeon: Deboraha Sprang, MD;  Location: [facility] CATH LAB;  Service: Cardiovascular;  Laterality: N/A;  . TEE WITHOUT CARDIOVERSION N/A 03/30/2013   Procedure: TRANSESOPHAGEAL ECHOCARDIOGRAM (TEE);  Surgeon: Thayer Headings, MD;  Location: [facility];  Service: Cardiovascular;  Laterality: N/A;  . V TACH ABLATION  06/10/2017  . V TACH ABLATION N/A 06/10/2017   Procedure: V Tach Ablation;  Surgeon: Evans Lance, MD;  Location: [facility] CV LAB;  Service: Cardiovascular;  Laterality: N/A;     Home Medications:  Prior to Admission medications   Medication Sig Start Date End Date Taking? Authorizing Provider  albuterol (PROVENTIL) (2.5 MG/3ML) 0.083% nebulizer solution Take 3 mLs (2.5 mg total) by nebulization every 6 (six) hours as needed for wheezing or shortness of breath. 01/14/15  Yes Elsie Stain, MD  Albuterol Sulfate  (PROAIR RESPICLICK) 409 (90 Base) MCG/ACT AEPB Inhale 2 puffs into the lungs every 6 (six) hours as needed (shortness of breath/ wheezing).   Yes [provider]  amiodarone (PACERONE) 200 MG tablet Take 1 tablet (200 mg total) by mouth daily. 400mg  by mouth twice daily x 2 weeks, 400mg  by mouth once daily x 2 weeks and then resume 200mg  by mouth daily. Patient taking differently: Take 100 mg by mouth 2 (two) times daily.  07/05/17  Yes Deboraha Sprang, MD  Carboxymethylcellul-Glycerin (LUBRICATING EYE DROPS OP) Apply 1 drop to eye daily as needed (dry eyes).   Yes [provider]  cholecalciferol (VITAMIN D) 1000 UNITS tablet Take 1,000 Units by mouth daily.   Yes [provider]  ELIQUIS 5 MG TABS tablet TAKE 1 TABLET BY MOUTH TWICE A DAY 07/30/15  Yes Minus Breeding, MD  finasteride (PROSCAR) 5 MG tablet Take 5 mg by mouth daily.    Yes [provider]  fluticasone (FLONASE) 50 MCG/ACT nasal spray Place 1 spray into both nostrils daily.  09/08/17  Yes [provider]  Fluticasone-Salmeterol (ADVAIR) 250-50 MCG/DOSE AEPB Inhale 1 puff into the lungs 2 (  two) times daily.    Yes [provider]  furosemide (LASIX) 40 MG tablet TAKE ONE TABLET BY MOUTH ONCE DAILY 09/23/16  Yes Hochrein, Jeneen Rinks, MD  levothyroxine (SYNTHROID, LEVOTHROID) 125 MCG tablet Take 125 mcg by mouth daily. 06/15/17  Yes [provider]  loratadine (CLARITIN) 10 MG tablet Take 10 mg by mouth daily.   Yes [provider]  Melatonin 5 MG CAPS Take 5 mg by mouth at bedtime.    Yes [provider]  Multiple Vitamin (MULTIVITAMIN WITH MINERALS) TABS tablet Take 1 tablet by mouth daily.   Yes [provider]  OXYGEN Inhale 2 L into the lungs See admin instructions. Use oxygen whenever resting   Yes [provider]  pantoprazole (PROTONIX) 40 MG tablet Take 1 tablet (40 mg total) by mouth daily. 07/21/16  Yes Esterwood, Amy S, PA-C  POTASSIUM PO  Take 1 tablet by mouth at bedtime.   Yes [provider]  Psyllium (METAMUCIL PO) Take 1 Dose by mouth daily. Mix in liquid and drink    Yes [provider]  Tamsulosin HCl (FLOMAX) 0.4 MG CAPS Take 0.4 mg by mouth at bedtime.    Yes [provider]  azelastine (ASTELIN) 0.1 % nasal spray Place 1 spray into both nostrils 2 (two) times daily.  09/07/17   [provider]  nitroGLYCERIN (NITROSTAT) 0.4 MG SL tablet Place 0.4 mg under the tongue every 5 (five) minutes as needed for chest pain.  11/12/11   Burtis Junes, NP  promethazine (PHENERGAN) 25 MG tablet Take 25 mg by mouth daily as needed for nausea or vomiting.  04/20/17   [provider]  sacubitril-valsartan (ENTRESTO) 24-26 MG Take 1 tablet by mouth 2 (two) times daily. 03/26/17   Deboraha Sprang, MD    Inpatient Medications: Scheduled Meds: . amiodarone  100 mg Oral BID  . apixaban  5 mg Oral BID  . cholecalciferol  1,000 Units Oral Daily  . feeding supplement (PRO-STAT SUGAR FREE 64)  30 mL Oral BID  . finasteride  5 mg Oral Daily  . furosemide  40 mg Oral Daily  . levothyroxine  125 mcg Oral QAC breakfast  . loratadine  10 mg Oral Daily  . methylPREDNISolone (SOLU-MEDROL) injection  40 mg Intravenous Q8H  . mometasone-formoterol  2 puff Inhalation BID  . multivitamin with minerals  1 tablet Oral Daily  . pantoprazole  40 mg Oral Daily  . psyllium  1 packet Oral Daily  . sacubitril-valsartan  1 tablet Oral BID  . sodium chloride flush  3 mL Intravenous Q12H  . tamsulosin  0.4 mg Oral QHS   Continuous Infusions: . sodium chloride    . azithromycin Stopped (09/16/17 2209)  . cefTRIAXone (ROCEPHIN)  IV Stopped (09/16/17 2051)   PRN Meds: sodium chloride, acetaminophen **OR** acetaminophen, albuterol, albuterol, benzonatate, nitroGLYCERIN, polyethylene glycol, promethazine, sodium chloride flush  Allergies:    Allergies  Allergen Reactions  . Xarelto [Rivaroxaban] Other (See  Comments)    Bleeding  . Adhesive [Tape] Hives, Itching and Rash  . Atorvastatin Cough  . Codeine     nauseated  . Isosorbide Nitrate Other (See Comments)    Made him feel bad  . Latex Itching and Other (See Comments)    Rash, itching, burning  . Levofloxacin Other (See Comments)    Tachycardia  . Lisinopril Cough  . Lorazepam Other (See Comments)    "felt bad"  . Sertraline Other (See Comments)  Made him feel bad  . Tiotropium Bromide Monohydrate Swelling    Throat swelling    Social History:   Social History   Social History  . Marital status: Married    Spouse name: N/A  . Number of children: 2  . Years of education: N/A   Occupational History  . Retired     Press photographer  . Works [place] History Main Topics  . Smoking status: Former Smoker    Packs/day: 2.50    Years: 40.00    Types: Cigarettes    Quit date: 11/24/1983  . Smokeless tobacco: Never Used     Comment: 2 1/2 ppd x 40 years  . Alcohol use No  . Drug use: No  . Sexual activity: Yes    Birth control/ protection: None   Other Topics Concern  . Not on file   Social History Narrative   Still works at [employer] part-time   Lives with Wife in [place]    Family History:    Family History  Problem Relation Age of Onset  . Emphysema Mother   . Heart disease Mother        AVR  . Heart failure Father        Died agwe 77     ROS:  Please see the history of present illness.  ROS  All other ROS reviewed and negative.     Physical Exam/Data:   Vitals:   09/17/17 0613 09/17/17 1325 09/17/17 1404 09/17/17 1507  BP: 123/70  109/61 (!) 119/104  Pulse: 76  74   Resp: 20  18   Temp: 97.6 F (36.4 C)  97.6 F (36.4 C)   TempSrc: Oral  Oral   SpO2: (!) 89% (!) 89% 92%   Weight:       No intake or output data in the 24 hours ending 09/17/17 1623 Filed Weights   09/16/17 0500  Weight: 205 lb 0.4 oz (93 kg)   Body mass index is 27.05 kg/m.  General:  Well nourished,  well developed, in no acute distress right now he appears relaxed HEENT: normal Lymph: no adenopathy Neck: no JVD Endocrine:  No thryomegaly Vascular: No carotid bruits; FA pulses 2+ bilaterally without bruits  Cardiac:  normal S1, S2; RRR; no murmur, healthy appearing left subclavian defibrillator pocket Lungs: Diminished breath sounds throughout but otherwise clear to auscultation bilaterally, no wheezing, rhonchi or rales  Abd: soft, nontender, no hepatomegaly  Ext: no edema Musculoskeletal:  No deformities, BUE and BLE strength normal and equal Skin: warm and dry  Neuro:  CNs 2-12 intact, no focal abnormalities noted Psych:  Normal affect   EKG:  The EKG was personally reviewed and demonstrates: Sustained ventricular tachycardia at 147 bpm with positive concordance across the precordial leads Telemetry:  Telemetry was personally reviewed and demonstrates: Sustained ventricular tachycardia converting to sinus rhythm  Relevant CV Studies: ECHO 09/16/2017  - Left ventricle: The cavity size was normal. Wall thickness was   increased in a pattern of mild LVH. Systolic function was mildly   reduced. The estimated ejection fraction was in the range of 45%   to 50%. There is akinesis of the basalinferolateral and inferior   myocardium. Doppler parameters are consistent with abnormal left   ventricular relaxation (grade 1 diastolic dysfunction). - Aortic valve: There was trivial regurgitation. - Aortic root: The aortic root was mildly dilated. - Mitral valve: There was mild regurgitation.  Impressions:  - Akinesis of the basal/mild  inferior and inferolateral walls;   overall mildly reduced LV systolic function; mild diastolic   dysfunction; trace AI; mildly dilated aortic root; mild MR.   Laboratory Data:  Chemistry Recent Labs Lab 09/15/17 1626 09/16/17 0527 09/17/17 0551  NA 137 136 138  K 3.6 4.0 4.9  CL 99* 100* 98*  CO2 29 25 29   GLUCOSE 101* 154* 146*  BUN 22*  25* 25*  CREATININE 1.51* 1.16 1.07  CALCIUM 8.7* 8.6* 8.8*  GFRNONAA 43* 59* >60  GFRAA 50* >60 >60  ANIONGAP 9 11 11      Recent Labs Lab 09/15/17 1626 09/16/17 0527 09/17/17 0551  PROT 6.3* 6.9 6.2*  ALBUMIN 2.8* 3.0* 2.7*  AST 21 22 19   ALT 27 29 25   ALKPHOS 90 99 79  BILITOT 0.8 0.6 0.7   Hematology Recent Labs Lab 09/15/17 1626 09/16/17 0527 09/17/17 0551  WBC 10.8* 7.0 15.0*  RBC 4.34 4.73 4.30  HGB 13.9 14.8 13.6  HCT 41.0 44.3 40.8  MCV 94.5 93.7 94.9  MCH 32.0 31.3 31.6  MCHC 33.9 33.4 33.3  RDW 14.1 14.0 14.1  PLT 241 242 236   Cardiac Enzymes Recent Labs Lab 09/15/17 1626  TROPONINI <0.03   No results for input(s): TROPIPOC in the last 168 hours.  BNP Recent Labs Lab 09/15/17 1626  BNP 57.6    DDimer No results for input(s): DDIMER in the last 168 hours.  Radiology/Studies:  Dg Chest 2 View  Result Date: 09/15/2017 CLINICAL DATA:  Shortness of breath and congestion for 2 weeks EXAM: CHEST  2 VIEW COMPARISON:  09/03/2017 FINDINGS: Cardiac shadow is stable. Defibrillator is again noted. Mild interstitial changes are seen with basilar scarring. No focal infiltrate is noted. Very mild interstitial edema is present. No acute bony abnormality is seen. IMPRESSION: Mild interstitial edema. Chronic changes in the bases bilaterally. Electronically Signed   By: Inez Catalina M.D.   On: 09/15/2017 19:59    Assessment and Plan:   1. Sustained VT: Is a long-standing problem but appears to have been relatively quiescent on amiodarone therapy.  This possible that the current flare of arrhythmia is related to his respiratory illness and increased use of beta agonist medications.  Will restart a low-dose of beta-blocker, continue amiodarone.  He is very resistant to the idea of increasing the dose of amiodarone.  He does not think he can tolerate more than 200 mg daily.  He does not want to have future ablation procedures.  Will make his antitachycardia therapy more  aggressive in the VT 1 zone.  Clearly the current bursts at 85% of cycle length are unsuccessful.  We will lengthen the number of beats in each burst and also reduce the percentage of cycle length, then add ramp sequences.  Since he did not have syncope with this current arrhythmia, we will not add defibrillator shocks in the VT 1 zone.  Despite having episodes of atrial fibrillation and atrial tachycardia, he has not had any ventricular rates approaching with a therapy zone during atrial arrhythmia. 2. CHF: He does not have evidence of congestive heart failure by physical exam, chest x-ray or device measured thoracic impedance.  He appears to be euvolemic.  He did not tolerate ACE inhibitors in the past.  By the most recent echo his EF is actually better than it has been in the past.  It is likely that the area of perfusion scar described on his nuclear test is the nidus for his ventricular tachycardia, even though  he does not have significant coronary disease 3. CAD: He does not have angina pectoris, had minimal coronary artery sclerosis at angiography and does not have symptoms suggestive of an acute coronary event.  I would not be surprised if his cardiac troponin is mildly abnormal following the prolonged episode of VT at around 160 bpm.  I do not think he needs new coronary evaluation even if his troponin is mildly abnormal. 4. COPD/chronic resp failure: This will preclude the use of nonselective beta blockers or higher dose even of selective blockers.  If possible it be beneficial to use more selective bronchodilators such as Xopenex.  Avoid long-acting beta agonists, at least  during the current period of increased ventricular irritability.  OK to use inhaled steroids. 5. Amiodarone related hypothyroidism: Recent TSH normal.  Plans for management and defibrillator setting changes discussed with Dr. Curt Bears, who will be on call for EP this weekend.  For questions or updates, please contact [place] Please consult [email or web address] for contact info under Cardiology/STEMI.   Signed, Sanda Klein, MD  09/17/2017 4:23 PM

## 2017-09-17 NOTE — Progress Notes (Signed)
   09/17/17 1600  Clinical Encounter Type  Visited With Patient and family together  Visit Type Initial;Psychological support;Spiritual support;Other (Comment) (Rapid Response )  Referral From Family  Consult/Referral To Chaplain  Spiritual Encounters  Spiritual Needs Prayer;Emotional;Other (Comment) (Pastoral Conversation/Support)  Stress Factors  Patient Stress Factors Health changes  Family Stress Factors Health changes   I went into the patient's room during a Rapid Response to support the patient's wife, who was at the bedside. She was very receptive to my visit and requested that I pray with her. We prayed for her husband and for her.  The patient's wife stated that her husband is Corey Tyler and we were able to connect, due to me being a [employer].  She stated that they have dealt with the patient's heart condition before and she was not really anxious about the situation. The patient was talking to me and able to thank me for visiting.   Please, contact Spiritual Care for further assistance.   [place] M.Div.

## 2017-09-17 NOTE — Progress Notes (Addendum)
Patient reports he was standing in the bathroom, shaving. Reports heart is racing. Patient placed on tele monitor. MD paged. Will put orders in. Rapid response at bedside.

## 2017-09-18 LAB — COMPREHENSIVE METABOLIC PANEL
ALT: 24 U/L (ref 17–63)
AST: 15 U/L (ref 15–41)
Albumin: 2.6 g/dL — ABNORMAL LOW (ref 3.5–5.0)
Alkaline Phosphatase: 70 U/L (ref 38–126)
Anion gap: 9 (ref 5–15)
BILIRUBIN TOTAL: 0.4 mg/dL (ref 0.3–1.2)
BUN: 29 mg/dL — AB (ref 6–20)
CO2: 27 mmol/L (ref 22–32)
Calcium: 8.4 mg/dL — ABNORMAL LOW (ref 8.9–10.3)
Chloride: 100 mmol/L — ABNORMAL LOW (ref 101–111)
Creatinine, Ser: 1.12 mg/dL (ref 0.61–1.24)
Glucose, Bld: 160 mg/dL — ABNORMAL HIGH (ref 65–99)
POTASSIUM: 3.9 mmol/L (ref 3.5–5.1)
Sodium: 136 mmol/L (ref 135–145)
TOTAL PROTEIN: 5.6 g/dL — AB (ref 6.5–8.1)

## 2017-09-18 LAB — TROPONIN I

## 2017-09-18 LAB — CBC
HCT: 39.6 % (ref 39.0–52.0)
Hemoglobin: 13.5 g/dL (ref 13.0–17.0)
MCH: 32.1 pg (ref 26.0–34.0)
MCHC: 34.1 g/dL (ref 30.0–36.0)
MCV: 94.1 fL (ref 78.0–100.0)
Platelets: 228 10*3/uL (ref 150–400)
RBC: 4.21 MIL/uL — ABNORMAL LOW (ref 4.22–5.81)
RDW: 14.3 % (ref 11.5–15.5)
WBC: 15.7 10*3/uL — AB (ref 4.0–10.5)

## 2017-09-18 MED ORDER — METOPROLOL TARTRATE 25 MG PO TABS
25.0000 mg | ORAL_TABLET | Freq: Two times a day (BID) | ORAL | Status: DC
  Administered 2017-09-18 – 2017-09-19 (×3): 25 mg via ORAL
  Filled 2017-09-18 (×3): qty 1

## 2017-09-18 NOTE — Progress Notes (Signed)
Subjective:  Breathing is much improved today.  Less coughing.  Had one episode of ventricular tachycardia terminated by pacing earlier this morning.  Feels much better today.  No chest pain.  Objective:  Vital Signs in the last 24 hours: BP 112/72 (BP Location: Right Arm)   Pulse 65   Temp 97.7 F (36.5 C) (Oral)   Resp 18   Wt 93.1 kg (205 lb 4.8 oz)   SpO2 91%   BMI 27.09 kg/m   Physical Exam: Pleasant male currently in no acute distress lying in bed Lungs:  Clear  Cardiac:  Regular rhythm, normal S1 and S2, no S3 Extremities:  No edema present  Intake/Output from previous day: 10/26 0701 - 10/27 0700 In: -  Out: 400 [Urine:400] Weight Filed Weights   09/16/17 0500 09/18/17 0448  Weight: 93 kg (205 lb 0.4 oz) 93.1 kg (205 lb 4.8 oz)    Lab Results: Basic Metabolic Panel:  Recent Labs  09/17/17 0551 09/18/17 0230  NA 138 136  K 4.9 3.9  CL 98* 100*  CO2 29 27  GLUCOSE 146* 160*  BUN 25* 29*  CREATININE 1.07 1.12    CBC:  Recent Labs  09/15/17 1626  09/17/17 0551 09/18/17 0230  WBC 10.8*  < > 15.0* 15.7*  NEUTROABS 7.8*  --   --   --   HGB 13.9  < > 13.6 13.5  HCT 41.0  < > 40.8 39.6  MCV 94.5  < > 94.9 94.1  PLT 241  < > 236 228  < > = values in this interval not displayed.  BNP    Component Value Date/Time   BNP 57.6 09/15/2017 1626   BNP 52.8 12/25/2014 1343    Cardiac Panel (last 3 results)  Recent Labs  09/17/17 1604 09/17/17 2121 09/18/17 0230  TROPONINI <0.03 <0.03 <0.03   Telemetry: Personally reviewed.  Episode of ventricular tachycardia was 33 beats and terminated by pacing earlier today.  Occasional sinus rhythm with ventricular paced beats.  Some PVCs noted.  Assessment/Plan:  1.  Paroxysmal ventricular tachycardia-appears to settle down some with changes made yesterday.  Dr. Sallyanne Kuster mentions does not wish to have additional ablation or increase amiodarone.  Currently on beta blocker started yesterday.-Successfully  terminated tachycardia with pacing earlier today.  It would be helpful to get rid of the Zithromax as this could exacerbate ventricular tachycardia 2.  Minimal CAD 3.  COPD with exacerbation 4.  Amiodarone-related hypothyroidism previously treated  Recommendations:  Clinically is much better.  We'll try to increase beta blocker little bit and I would stop his Zithromax for now.  Continue to monitor and ambulate today.   [name]  MD [facility] Cardiology  09/18/2017, 8:12 AM

## 2017-09-18 NOTE — Progress Notes (Signed)
Patient had 33 beat run VT around 0713. Patient denies any chest pain.  Cardiologist Dr. Wynonia Lawman notified when arrived on unit.

## 2017-09-18 NOTE — H&P (Addendum)
PROGRESS NOTE                                                                                                                                                                                                             Patient Demographics:    Corey Tyler, is a 76 y.o. male, DOB - 1941/05/08, PIR:[id number]  Admit date - 09/15/2017   Admitting Physician [name], MD  Outpatient Primary MD for the patient is [name], MD  LOS - 3  Outpatient Specialists:  [facility]  No chief complaint on file.    Copd exacerbation  Brief Narrative w Copd on home o2, CHF (EF 25-30%) , Pafib who presents with hypoxia. Pt states pox 72% on RA, Feels weak. Slight dry cough. + dyspnea x 1-2 weeks. " felt like my lungs were full" Pt was seen in ED on 10/12 and sent home but his dyspnea continued and came to pcp office today . Lying down makes his breathing better. Exertion worse. Slight wheezing. Pt denies fever, chills, cp, palp, n/v, diarrhea, brbpr. Pox in office on 3L o2 [place] 89%. Pt sent for direct admission for Copd exacerbation.    Subjective:    Corey Tyler today is feeling better.  Yesterday pt had SVT after taking his dulera.  Pt felt nauseated, and was noted to be in wide complex tachycardia. Resolved with metoprolol 5mg  iv x1.   Trop I q6hx3 negative.   No headache, No chest pain, No abdominal pain - No Nausea, No new weakness tingling or numbness, No Cough - SOB.    Assessment  & Plan :    Principal Problem:   Acute respiratory failure (HCC) Active Problems:   COPD exacerbation (HCC)   Hypoxia Copd exacerbation vs CAP Resp pathogen panel=> negative Cont rocephin, zithromax iv Solumedrol 80mg  iv q8h=> prednisone 60mg  po qday  Advair=> Dulera,  STOP dulera. 10/26 Cont albuterol   CHF Cont lasix  Pafib /SVT Cont metoprolol 12.5mg  po bid (started 10/26) Cont amiodarone Cont Eliquis appreciate  cardiology input  Bph Cont proscar  Mild renal insufficiency Check cmp in am  Hypothyroidism Check tsh Cont levothyroxine  Code Status:FULL CODE  Family  Communication :w patient  Disposition Plan:home  Barriers For Discharge:  Consults :none  Procedures : none  DVT Prophylaxis: Eqliuis, SCDs   Lab Results  Component Value Date   PLT 228 09/18/2017    Anti-infectives    Start     Dose/Rate Route Frequency Ordered Stop   09/15/17 2200  azithromycin (ZITHROMAX) 500 mg in dextrose 5 % 250 mL IVPB     500 mg 250 mL/hr over 60 Minutes Intravenous Every 24 hours 09/15/17 2011     09/15/17 2100  cefTRIAXone (ROCEPHIN) 1 g in dextrose 5 % 50 mL IVPB     1 g 100 mL/hr over 30 Minutes Intravenous Every 24 hours 09/15/17 2011          Objective:   Vitals:   09/17/17 1419 09/17/17 1507 09/17/17 2142 09/18/17 0448  BP: 107/82 (!) 119/104 125/74 112/72  Pulse: (!) 150  61 65  Resp: 16  18 18   Temp:   98.2 F (36.8 C) 97.7 F (36.5 C)  TempSrc:   Oral Oral  SpO2: 94%  92% 91%  Weight:    93.1 kg (205 lb 4.8 oz)    Wt Readings from Last 3 Encounters:  09/18/17 93.1 kg (205 lb 4.8 oz)  09/03/17 95.3 kg (210 lb)  07/13/17 94.3 kg (207 lb 12.8 oz)    No intake or output data in the 24 hours ending 09/18/17 3825   Physical Exam  Awake Alert, Oriented X 3, No new F.N deficits, Normal affect [place].AT,PERRAL Supple Neck,No JVD, No cervical lymphadenopathy appriciated.  Symmetrical Chest wall movement, Good air movement bilaterally, CTAB RRR,No Gallops,Rubs or new Murmurs, No Parasternal Heave +ve B.Sounds, Abd Soft, No tenderness, No organomegaly appriciated, No rebound - guarding or rigidity. No Cyanosis, Clubbing or edema, No new Rash or bruise      Data Review:    CBC  Recent Labs Lab 09/15/17 1626 09/16/17 0527 09/17/17 0551 09/18/17 0230  WBC 10.8* 7.0 15.0* 15.7*  HGB 13.9 14.8 13.6 13.5  HCT 41.0 44.3 40.8 39.6  PLT  241 242 236 228  MCV 94.5 93.7 94.9 94.1  MCH 32.0 31.3 31.6 32.1  MCHC 33.9 33.4 33.3 34.1  RDW 14.1 14.0 14.1 14.3  LYMPHSABS 1.8  --   --   --   MONOABS 1.0  --   --   --   EOSABS 0.3  --   --   --   BASOSABS 0.0  --   --   --     Chemistries   Recent Labs Lab 09/15/17 1626 09/16/17 0527 09/17/17 0551 09/18/17 0230  NA 137 136 138 136  K 3.6 4.0 4.9 3.9  CL 99* 100* 98* 100*  CO2 29 25 29 27   GLUCOSE 101* 154* 146* 160*  BUN 22* 25* 25* 29*  CREATININE 1.51* 1.16 1.07 1.12  CALCIUM 8.7* 8.6* 8.8* 8.4*  AST 21 22 19 15   ALT 27 29 25 24   ALKPHOS 90 99 79 70  BILITOT 0.8 0.6 0.7 0.4   ------------------------------------------------------------------------------------------------------------------ No results for input(s): CHOL, HDL, LDLCALC, TRIG, CHOLHDL, LDLDIRECT in the last 72 hours.  Lab Results  Component Value Date   HGBA1C 6.0 (H) 03/27/2013   ------------------------------------------------------------------------------------------------------------------  Recent Labs  09/15/17 1626  TSH 1.603   ------------------------------------------------------------------------------------------------------------------ No results for input(s): VITAMINB12, FOLATE, FERRITIN, TIBC, IRON, RETICCTPCT in the last 72 hours.  Coagulation profile  Recent Labs Lab 09/15/17 1626  INR 1.17    No results for input(s):  DDIMER in the last 72 hours.  Cardiac Enzymes  Recent Labs Lab 09/17/17 1604 09/17/17 2121 09/18/17 0230  TROPONINI <0.03 <0.03 <0.03   ------------------------------------------------------------------------------------------------------------------    Component Value Date/Time   BNP 57.6 09/15/2017 1626   BNP 52.8 12/25/2014 1343    Inpatient Medications  Scheduled Meds: . amiodarone  100 mg Oral BID  . apixaban  5 mg Oral BID  . cholecalciferol  1,000 Units Oral Daily  . feeding supplement (PRO-STAT SUGAR FREE 64)  30 mL Oral BID  .  finasteride  5 mg Oral Daily  . furosemide  40 mg Oral Daily  . levothyroxine  125 mcg Oral QAC breakfast  . loratadine  10 mg Oral Daily  . metoprolol tartrate  12.5 mg Oral BID  . multivitamin with minerals  1 tablet Oral Daily  . pantoprazole  40 mg Oral Daily  . predniSONE  60 mg Oral Q breakfast  . psyllium  1 packet Oral Daily  . sacubitril-valsartan  1 tablet Oral BID  . sodium chloride flush  3 mL Intravenous Q12H  . tamsulosin  0.4 mg Oral QHS   Continuous Infusions: . sodium chloride    . azithromycin Stopped (09/17/17 2341)  . cefTRIAXone (ROCEPHIN)  IV Stopped (09/17/17 2311)   PRN Meds:.sodium chloride, acetaminophen **OR** acetaminophen, albuterol, albuterol, benzonatate, nitroGLYCERIN, polyethylene glycol, promethazine, sodium chloride flush  Micro Results Recent Results (from the past 240 hour(s))  Respiratory Panel by PCR     Status: None   Collection Time: 09/15/17  8:19 PM  Result Value Ref Range Status   Adenovirus NOT DETECTED NOT DETECTED Final   Coronavirus 229E NOT DETECTED NOT DETECTED Final   Coronavirus HKU1 NOT DETECTED NOT DETECTED Final   Coronavirus NL63 NOT DETECTED NOT DETECTED Final   Coronavirus OC43 NOT DETECTED NOT DETECTED Final   Metapneumovirus NOT DETECTED NOT DETECTED Final   Rhinovirus / Enterovirus NOT DETECTED NOT DETECTED Final   Influenza A NOT DETECTED NOT DETECTED Final   Influenza B NOT DETECTED NOT DETECTED Final   Parainfluenza Virus 1 NOT DETECTED NOT DETECTED Final   Parainfluenza Virus 2 NOT DETECTED NOT DETECTED Final   Parainfluenza Virus 3 NOT DETECTED NOT DETECTED Final   Parainfluenza Virus 4 NOT DETECTED NOT DETECTED Final   Respiratory Syncytial Virus NOT DETECTED NOT DETECTED Final   Bordetella pertussis NOT DETECTED NOT DETECTED Final   Chlamydophila pneumoniae NOT DETECTED NOT DETECTED Final   Mycoplasma pneumoniae NOT DETECTED NOT DETECTED Final    Comment: Performed at [facility], [place] [address], [place], [place] [address]    Radiology Reports Dg Chest 2 View  Result Date: 09/15/2017 CLINICAL DATA:  Shortness of breath and congestion for 2 weeks EXAM: CHEST  2 VIEW COMPARISON:  09/03/2017 FINDINGS: Cardiac shadow is stable. Defibrillator is again noted. Mild interstitial changes are seen with basilar scarring. No focal infiltrate is noted. Very mild interstitial edema is present. No acute bony abnormality is seen. IMPRESSION: Mild interstitial edema. Chronic changes in the bases bilaterally. Electronically Signed   By: Inez Catalina M.D.   On: 09/15/2017 19:59   Dg Chest 2 View  Result Date: 09/03/2017 CLINICAL DATA:  Cough and congestion and patient with history of COPD. EXAM: CHEST  2 VIEW COMPARISON:  PA and lateral chest 11/08/2016 and 04/08/2016. FINDINGS: The lungs are emphysematous with basilar scar, more notable on the left. The appearance is unchanged. No pneumothorax or pleural effusion. Heart size is normal. Aortic atherosclerosis is  noted. Paced device in place. No acute bony abnormality. IMPRESSION: COPD without acute disease. Electronically Signed   By: Inge Rise M.D.   On: 09/03/2017 17:51    Time Spent in minutes  30   [name] M.D on 09/18/2017 at 6:52 AM  Between 7am to 7am - Pager - [phone]

## 2017-09-19 LAB — COMPREHENSIVE METABOLIC PANEL
ALBUMIN: 2.6 g/dL — AB (ref 3.5–5.0)
ALK PHOS: 64 U/L (ref 38–126)
ALT: 25 U/L (ref 17–63)
ANION GAP: 5 (ref 5–15)
AST: 15 U/L (ref 15–41)
BUN: 31 mg/dL — ABNORMAL HIGH (ref 6–20)
CALCIUM: 8.3 mg/dL — AB (ref 8.9–10.3)
CO2: 32 mmol/L (ref 22–32)
Chloride: 101 mmol/L (ref 101–111)
Creatinine, Ser: 1.08 mg/dL (ref 0.61–1.24)
GFR calc Af Amer: >60 mL/min (ref >60)
GFR calc non Af Amer: >60 mL/min (ref >60)
GLUCOSE: 111 mg/dL — AB (ref 65–99)
POTASSIUM: 4.5 mmol/L (ref 3.5–5.1)
Sodium: 138 mmol/L (ref 135–145)
Total Bilirubin: 0.5 mg/dL (ref 0.3–1.2)
Total Protein: 5.3 g/dL — ABNORMAL LOW (ref 6.5–8.1)

## 2017-09-19 MED ORDER — CEFUROXIME AXETIL 500 MG PO TABS: 500.0000 mg | ORAL_TABLET | Freq: Two times a day (BID) | ORAL | 0 refills | Status: AC

## 2017-09-19 MED ORDER — PREDNISONE 10 MG PO TABS: ORAL_TABLET | ORAL | 0 refills | Status: DC

## 2017-09-19 MED ORDER — METOPROLOL TARTRATE 25 MG PO TABS: 25.0000 mg | ORAL_TABLET | Freq: Two times a day (BID) | ORAL | 0 refills | Status: DC

## 2017-09-19 MED ORDER — PREDNISONE 50 MG PO TABS: 50.0000 mg | ORAL_TABLET | Freq: Every day | ORAL | Status: DC

## 2017-09-19 MED ORDER — PRO-STAT SUGAR FREE PO LIQD: 30.0000 mL | Freq: Two times a day (BID) | ORAL | 0 refills | Status: DC

## 2017-09-19 MED ORDER — POLYETHYLENE GLYCOL 3350 17 G PO PACK: 17.0000 g | PACK | Freq: Every day | ORAL | 0 refills | Status: DC | PRN

## 2017-09-19 MED ORDER — PREDNISONE 50 MG PO TABS
50.0000 mg | ORAL_TABLET | Freq: Every day | ORAL | Status: DC
  Administered 2017-09-19: 50 mg via ORAL
  Filled 2017-09-19: qty 1

## 2017-09-19 NOTE — Discharge Summary (Signed)
Corey Tyler, is a 76 y.o. male  DOB Nov 03, 1941  MRN [id number].  Admission date:  09/15/2017  Admitting Physician  [name], MD  Discharge Date:  09/19/2017   Primary MD  [name], MD  Recommendations for primary care physician for things to follow:   Hypoxia Copd exacerbation vs CAP Resp pathogen panel=> negative Cefuroxime 500mg  po bid x 5 days prednisone  Taper (50mg  ...10mg  po qday) Hold off on Advair Cont albuterol  F/u with pcp in 1 week  CHF Cont lasix  Pafib/monomorphic VT Cont metoprolol 25mg  po bid (started 10/26) Cont amiodarone Cont Eliquis appreciate cardiology input F/u with cardiology in 1 week  Bph Cont proscar  Mild renal insufficiency resolved Check cmp in 1 week  Hypothyroidism Cont levothyroxine   Admission Diagnosis  COPD exacerbation   Discharge Diagnosis  COPD exacerbation      Principal Problem:   Acute respiratory failure ([place]) Active Problems:   COPD exacerbation ([place])      Past Medical History:  Diagnosis Date  . A-fib ([place])    a. Noted on 12/2012 interrogation, placed on Apixaban and ultimately stopped NOACs 2/2 personal decision 8/14.  Marland Kitchen AICD (automatic cardioverter/defibrillator) present    Cardiac defibrillator -dual  [place] [facility]  . Arthritis   . Atrial tachycardia-non sustained    a. Noted on 03/2012 interrogation.  . Chronic systolic heart failure (HCC)    EF down to 20 to 25% per echo November 2012  . COPD (chronic obstructive pulmonary disease) ([place])   . Hyperlipidemia   . Hypertension   . ICD (implantable cardiac defibrillator) in place   . NICM (nonischemic cardiomyopathy) ([place])    a. Normal cors 2000. b. Minimal plaque 2013.  . Old myocardial infarction    a. ?Silent MI. b. Large fixed inferolateral defect c/w with prior infarct, no ischemia. c. Cath in 2000/2013 with only minimal CAD.  Marland Kitchen Presence of permanent cardiac  pacemaker   . Pulmonary nodule, right    Last scan in 2010 showing stability; felt to be benign.  Marland Kitchen PVC's (premature ventricular contractions)   . Ventricular tachycardia, polymorphic ([place])    Rx via ICD 12/14    Past Surgical History:  Procedure Laterality Date  . APPENDECTOMY    . CARDIAC CATHETERIZATION  2000  . CARDIAC DEFIBRILLATOR PLACEMENT    . CARDIOVERSION  03/30/2013   Procedure: CARDIOVERSION;  Surgeon: Thayer Headings, MD;  Location: [facility];  Service: Cardiovascular;;  . COLON SURGERY    . COLONOSCOPY N/A 09/25/2013   Procedure: COLONOSCOPY;  Surgeon: Jerene Bears, MD;  Location: [facility] ENDOSCOPY;  Service: Endoscopy;  Laterality: N/A;  . ICD  12/2011   [name]  . IMPLANTABLE CARDIOVERTER DEFIBRILLATOR IMPLANT N/A 01/06/2012   Procedure: IMPLANTABLE CARDIOVERTER DEFIBRILLATOR IMPLANT;  Surgeon: Deboraha Sprang, MD;  Location: [facility] CATH LAB;  Service: Cardiovascular;  Laterality: N/A;  . TEE WITHOUT CARDIOVERSION N/A 03/30/2013   Procedure: TRANSESOPHAGEAL ECHOCARDIOGRAM (TEE);  Surgeon: Thayer Headings, MD;  Location: [facility]  ENDOSCOPY;  Service: Cardiovascular;  Laterality: N/A;  . V TACH ABLATION  06/10/2017  . V TACH ABLATION N/A 06/10/2017   Procedure: V Tach Ablation;  Surgeon: Evans Lance, MD;  Location: [facility] CV LAB;  Service: Cardiovascular;  Laterality: N/A;       HPI  from the history and physical done on the day of admission:     76yo male with Copd on home o2, CHF (EF 25-30%) , Pafib who presents with hypoxia. Pt states pox 72% on RA, Feels weak. Slight dry cough. + dyspnea x 1-2 weeks. " felt like my lungs were full" Pt was seen in ED on 10/12 and sent home but his dyspnea continued and came to pcp office today . Lying down makes his breathing better. Exertion worse. Slight wheezing. Pt denies fever, chills, cp, palp, n/v, diarrhea, brbpr. Pox in office on 3L o2 [place] 89%. Pt sent for direct admission for Copd exacerbation.      Hospital Course:      Pt was admitted.  CXR negative for infiltrate . Pt started on solumedrol 80mg  iv q8h, and rocephin and zithromax.  Pt was doing well and thus solumedrol was decreased on day 2 to 40mg  iv q8h.  Advair nonformulary and therefore dulera used in its place.  Pt on 10/26 had monomorphic VT at 163, which was terminated w metoprolol 5mg  iv x1 => nsr.   Trop I q6h x3 negative.  Cardiology consulted and started metoprolol 12.5mg  po bid.  Dulera discontinued. Metoprolol was increase to 25mg  po bid on 10/27.  Zithromax discontinued on 10/27.  Pt has been doing well.  Breathing back to baseline.  Pt hopes to be discharged today.      Follow UP  Follow-up Information    [name], MD Follow up in 1 week(s).   Specialty:  Internal Medicine Contact information: [place] [address] [phone]        Minus Breeding, MD Follow up in 1 week(s).   Specialty:  Cardiology Contact information: [address] [place] [address] [phone]            Consults obtained - cardiology  Discharge Condition: stable  Diet and Activity recommendation: See Discharge Instructions below  Discharge Instructions      Discharge Medications     Allergies as of 09/19/2017      Reactions   Xarelto [rivaroxaban] Other (See Comments)   Bleeding   Adhesive [tape] Hives, Itching, Rash   Atorvastatin Cough   Codeine    nauseated   Isosorbide Nitrate Other (See Comments)   Made him feel bad   Latex Itching, Other (See Comments)   Rash, itching, burning   Levofloxacin Other (See Comments)   Tachycardia   Lisinopril Cough   Lorazepam Other (See Comments)   "felt bad"   Sertraline Other (See Comments)   Made him feel bad   Tiotropium Bromide Monohydrate Swelling   Throat swelling      Medication List    STOP taking these medications   Fluticasone-Salmeterol 250-50 MCG/DOSE Aepb Commonly known as:  ADVAIR     TAKE these medications   PROAIR  RESPICLICK 891 (90 Base) MCG/ACT Aepb Generic drug:  Albuterol Sulfate Inhale 2 puffs into the lungs every 6 (six) hours as needed (shortness of breath/ wheezing).   albuterol (2.5 MG/3ML) 0.083% nebulizer solution Commonly known as:  PROVENTIL Take 3 mLs (2.5 mg total) by nebulization every 6 (six) hours as  needed for wheezing or shortness of breath.   amiodarone 200 MG tablet Commonly known as:  PACERONE Take 1 tablet (200 mg total) by mouth daily. 400mg  by mouth twice daily x 2 weeks, 400mg  by mouth once daily x 2 weeks and then resume 200mg  by mouth daily. What changed:  how much to take  when to take this  additional instructions   azelastine 0.1 % nasal spray Commonly known as:  ASTELIN Place 1 spray into both nostrils 2 (two) times daily.   cefUROXime 500 MG tablet Commonly known as:  CEFTIN Take 1 tablet (500 mg total) by mouth 2 (two) times daily.   cholecalciferol 1000 units tablet Commonly known as:  VITAMIN D Take 1,000 Units by mouth daily.   ELIQUIS 5 MG Tabs tablet Generic drug:  apixaban TAKE 1 TABLET BY MOUTH TWICE A DAY   feeding supplement (PRO-STAT SUGAR FREE 64) Liqd Take 30 mLs by mouth 2 (two) times daily.   finasteride 5 MG tablet Commonly known as:  PROSCAR Take 5 mg by mouth daily.   FLOMAX 0.4 MG Caps capsule Generic drug:  tamsulosin Take 0.4 mg by mouth at bedtime.   fluticasone 50 MCG/ACT nasal spray Commonly known as:  FLONASE Place 1 spray into both nostrils daily.   furosemide 40 MG tablet Commonly known as:  LASIX TAKE ONE TABLET BY MOUTH ONCE DAILY   levothyroxine 125 MCG tablet Commonly known as:  SYNTHROID, LEVOTHROID Take 125 mcg by mouth daily.   loratadine 10 MG tablet Commonly known as:  CLARITIN Take 10 mg by mouth daily.   LUBRICATING EYE DROPS OP Apply 1 drop to eye daily as needed (dry eyes).   Melatonin 5 MG Caps Take 5 mg by mouth at bedtime.   METAMUCIL PO Take 1 Dose by mouth daily. Mix in liquid and  drink   metoprolol tartrate 25 MG tablet Commonly known as:  LOPRESSOR Take 1 tablet (25 mg total) by mouth 2 (two) times daily.   multivitamin with minerals Tabs tablet Take 1 tablet by mouth daily.   nitroGLYCERIN 0.4 MG SL tablet Commonly known as:  NITROSTAT Place 0.4 mg under the tongue every 5 (five) minutes as needed for chest pain.   OXYGEN Inhale 2 L into the lungs See admin instructions. Use oxygen whenever resting   pantoprazole 40 MG tablet Commonly known as:  PROTONIX Take 1 tablet (40 mg total) by mouth daily.   polyethylene glycol packet Commonly known as:  MIRALAX / GLYCOLAX Take 17 g by mouth daily as needed for mild constipation or moderate constipation.   POTASSIUM PO Take 1 tablet by mouth at bedtime.   predniSONE 10 MG tablet Commonly known as:  DELTASONE 50mg  po qday x 2 days then 40mg  po qday x 2 days then 30mg  po qday x 2 days then 20mg  po qday x 2 days then 10mg  po qday x 2 days   promethazine 25 MG tablet Commonly known as:  PHENERGAN Take 25 mg by mouth daily as needed for nausea or vomiting.   sacubitril-valsartan 24-26 MG Commonly known as:  ENTRESTO Take 1 tablet by mouth 2 (two) times daily.       Major procedures and Radiology Reports - PLEASE review detailed and final reports for all details, in brief -      Dg Chest 2 View  Result Date: 09/15/2017 CLINICAL DATA:  Shortness of breath and congestion for 2 weeks EXAM: CHEST  2 VIEW COMPARISON:  09/03/2017 FINDINGS: Cardiac shadow  is stable. Defibrillator is again noted. Mild interstitial changes are seen with basilar scarring. No focal infiltrate is noted. Very mild interstitial edema is present. No acute bony abnormality is seen. IMPRESSION: Mild interstitial edema. Chronic changes in the bases bilaterally. Electronically Signed   By: Inez Catalina M.D.   On: 09/15/2017 19:59   Dg Chest 2 View  Result Date: 09/03/2017 CLINICAL DATA:  Cough and congestion and patient with history of  COPD. EXAM: CHEST  2 VIEW COMPARISON:  PA and lateral chest 11/08/2016 and 04/08/2016. FINDINGS: The lungs are emphysematous with basilar scar, more notable on the left. The appearance is unchanged. No pneumothorax or pleural effusion. Heart size is normal. Aortic atherosclerosis is noted. Paced device in place. No acute bony abnormality. IMPRESSION: COPD without acute disease. Electronically Signed   By: Inge Rise M.D.   On: 09/03/2017 17:51    Micro Results     Recent Results (from the past 240 hour(s))  Respiratory Panel by PCR     Status: None   Collection Time: 09/15/17  8:19 PM  Result Value Ref Range Status   Adenovirus NOT DETECTED NOT DETECTED Final   Coronavirus 229E NOT DETECTED NOT DETECTED Final   Coronavirus HKU1 NOT DETECTED NOT DETECTED Final   Coronavirus NL63 NOT DETECTED NOT DETECTED Final   Coronavirus OC43 NOT DETECTED NOT DETECTED Final   Metapneumovirus NOT DETECTED NOT DETECTED Final   Rhinovirus / Enterovirus NOT DETECTED NOT DETECTED Final   Influenza A NOT DETECTED NOT DETECTED Final   Influenza B NOT DETECTED NOT DETECTED Final   Parainfluenza Virus 1 NOT DETECTED NOT DETECTED Final   Parainfluenza Virus 2 NOT DETECTED NOT DETECTED Final   Parainfluenza Virus 3 NOT DETECTED NOT DETECTED Final   Parainfluenza Virus 4 NOT DETECTED NOT DETECTED Final   Respiratory Syncytial Virus NOT DETECTED NOT DETECTED Final   Bordetella pertussis NOT DETECTED NOT DETECTED Final   Chlamydophila pneumoniae NOT DETECTED NOT DETECTED Final   Mycoplasma pneumoniae NOT DETECTED NOT DETECTED Final    Comment: Performed at [facility], [address], [place], [place] [address]       Today   Subjective    Rosalio Catterton today has no headache,no chest abdominal pain,no new weakness tingling or numbness, feels much better wants to go home today.    Objective   Blood pressure 131/75, pulse 61, temperature 98.1 F (36.7 C), temperature source Oral, resp. rate  16, weight 93.4 kg (205 lb 14.6 oz), SpO2 93 %.   Intake/Output Summary (Last 24 hours) at 09/19/17 0854 Last data filed at 09/18/17 2120  Gross per 24 hour  Intake              530 ml  Output              300 ml  Net              230 ml    Exam Awake Alert, Oriented x 3, No new F.N deficits, Normal affect [place].AT,PERRAL Supple Neck,No JVD, No cervical lymphadenopathy appriciated.  Symmetrical Chest wall movement, Good air movement bilaterally, CTAB RRR,No Gallops,Rubs or new Murmurs, No Parasternal Heave +ve B.Sounds, Abd Soft, Non tender, No organomegaly appriciated, No rebound -guarding or rigidity. No Cyanosis, Clubbing or edema, No new Rash or bruise   Data Review   CBC w Diff: Lab Results  Component Value Date   WBC 15.7 (H) 09/18/2017   HGB 13.5 09/18/2017   HGB 14.6 06/02/2017  HCT 39.6 09/18/2017   HCT 42.6 06/02/2017   PLT 228 09/18/2017   PLT 203 06/02/2017   LYMPHOPCT 16 09/15/2017   MONOPCT 9 09/15/2017   EOSPCT 2 09/15/2017   BASOPCT 0 09/15/2017    CMP: Lab Results  Component Value Date   NA 138 09/19/2017   NA 140 06/02/2017   K 4.5 09/19/2017   CL 101 09/19/2017   CO2 32 09/19/2017   BUN 31 (H) 09/19/2017   BUN 17 06/02/2017   CREATININE 1.08 09/19/2017   PROT 5.3 (L) 09/19/2017   ALBUMIN 2.6 (L) 09/19/2017   BILITOT 0.5 09/19/2017   ALKPHOS 64 09/19/2017   AST 15 09/19/2017   ALT 25 09/19/2017  .   Total Time in preparing paper work, data evaluation and todays exam - 68 minutes  [name] M.D on 09/19/2017 at 8:54 AM  [facility]   Office  [phone]

## 2017-09-19 NOTE — Progress Notes (Signed)
Patient ID: KAYVEON LENNARTZ, male   DOB: 1941/05/15, 76 y.o.   MRN: [id number]                                                                PROGRESS NOTE                                                                                                                                                                                                             Patient Demographics:    Juventino Pavone, is a 76 y.o. male, DOB - 09/07/1941, LPF:[id number]  Admit date - 09/15/2017   Admitting Physician [name], MD  Outpatient Primary MD for the patient is [name], MD  LOS - 4  Outpatient Specialists:    No chief complaint on file.      Brief Narrative  Copd on home o2, CHF (EF 25-30%) , Pafib who presents with hypoxia. Pt states pox 72% on RA, Feels weak. Slight dry cough. + dyspnea x 1-2 weeks. " felt like my lungs were full" Pt was seen in ED on 10/12 and sent home but his dyspnea continued and came to pcp office today . Lying down makes his breathing better. Exertion worse. Slight wheezing. Pt denies fever, chills, cp, palp, n/v, diarrhea, brbpr. Pox in office on 3L o2 [place] 89%. Pt sent for direct admission for Copd exacerbation.    Subjective:    Corey Tyler today feeling like breathing almost back to normal.  He is off zithromax. Afebrile, denies cp, palp, sob beyond his baseline.    No headache, No chest pain, No abdominal pain - No Nausea, No new weakness tingling or numbness   Assessment  & Plan :    Principal Problem:   Acute respiratory failure (HCC) Active Problems:   COPD exacerbation (HCC)  Hypoxia Copd exacerbation vs CAP Resp pathogen panel=> negative Zithromax (stopped 10/27) Cont rocephin,   Solumedrol 80mg  iv q8h=>prednisone  Decrease prednisone to 50mg  po qday Advair=> Dulera,  STOP dulera. 10/26 Cont albuterol   CHF Cont lasix  Pafib /SVT Cont metoprolol 25mg  po bid (started 10/26) Cont amiodarone Cont Eliquis appreciate cardiology  input  Bph Cont proscar  Mild renal insufficiency Check cmp in am  Hypothyroidism Check tsh Cont levothyroxine  Code Status:FULL CODE  Family Communication :w patient  Disposition Plan:home if OK with cardiology  Barriers For Discharge:  Consults :none  Procedures : none  DVT Prophylaxis: Eqliuis, SCDs    Anti-infectives    Start     Dose/Rate Route Frequency Ordered Stop   09/15/17 2200  azithromycin (ZITHROMAX) 500 mg in dextrose 5 % 250 mL IVPB  Status:  Discontinued     500 mg 250 mL/hr over 60 Minutes Intravenous Every 24 hours 09/15/17 2011 09/18/17 0817   09/15/17 2100  cefTRIAXone (ROCEPHIN) 1 g in dextrose 5 % 50 mL IVPB     1 g 100 mL/hr over 30 Minutes Intravenous Every 24 hours 09/15/17 2011          Objective:   Vitals:   09/18/17 0919 09/18/17 1500 09/18/17 2207 09/19/17 0427  BP: 122/69 (!) 108/56 130/74 131/75  Pulse: 68 66 63 61  Resp:  18 18 16   Temp:  98.1 F (36.7 C) 98.7 F (37.1 C) 98.1 F (36.7 C)  TempSrc:  Oral Oral Oral  SpO2: 94% 94% 92% 93%  Weight:    93.4 kg (205 lb 14.6 oz)    Wt Readings from Last 3 Encounters:  09/19/17 93.4 kg (205 lb 14.6 oz)  09/03/17 95.3 kg (210 lb)  07/13/17 94.3 kg (207 lb 12.8 oz)     Intake/Output Summary (Last 24 hours) at 09/19/17 0834 Last data filed at 09/18/17 2120  Gross per 24 hour  Intake              530 ml  Output              300 ml  Net              230 ml     Physical Exam  Awake Alert, Oriented X 3, No new F.N deficits, Normal affect [place].AT,PERRAL Supple Neck,No JVD, No cervical lymphadenopathy appriciated.  Symmetrical Chest wall movement, Good air movement bilaterally, CTAB RRR,No Gallops,Rubs or new Murmurs, No Parasternal Heave +ve B.Sounds, Abd Soft, No tenderness, No organomegaly appriciated, No rebound - guarding or rigidity. No Cyanosis, Clubbing or edema, No new Rash or bruise      Data Review:    CBC  Recent Labs Lab  09/15/17 1626 09/16/17 0527 09/17/17 0551 09/18/17 0230  WBC 10.8* 7.0 15.0* 15.7*  HGB 13.9 14.8 13.6 13.5  HCT 41.0 44.3 40.8 39.6  PLT 241 242 236 228  MCV 94.5 93.7 94.9 94.1  MCH 32.0 31.3 31.6 32.1  MCHC 33.9 33.4 33.3 34.1  RDW 14.1 14.0 14.1 14.3  LYMPHSABS 1.8  --   --   --   MONOABS 1.0  --   --   --   EOSABS 0.3  --   --   --   BASOSABS 0.0  --   --   --     Chemistries   Recent Labs Lab 09/15/17 1626 09/16/17 0527 09/17/17 0551 09/18/17 0230 09/19/17 0549  NA 137 136 138 136 138  K 3.6 4.0 4.9 3.9 4.5  CL 99* 100* 98* 100* 101  CO2 29 25 29 27  32  GLUCOSE 101* 154* 146* 160* 111*  BUN 22* 25* 25* 29* 31*  CREATININE 1.51* 1.16 1.07 1.12 1.08  CALCIUM 8.7* 8.6* 8.8* 8.4* 8.3*  AST 21 22 19 15 15   ALT 27 29 25 24 25   ALKPHOS 90 99 79 70 64  BILITOT 0.8 0.6 0.7 0.4 0.5   ------------------------------------------------------------------------------------------------------------------ No results for input(s): CHOL, HDL, LDLCALC, TRIG, CHOLHDL,  LDLDIRECT in the last 72 hours.  Lab Results  Component Value Date   HGBA1C 6.0 (H) 03/27/2013   ------------------------------------------------------------------------------------------------------------------ No results for input(s): TSH, T4TOTAL, T3FREE, THYROIDAB in the last 72 hours.  Invalid input(s): FREET3 ------------------------------------------------------------------------------------------------------------------ No results for input(s): VITAMINB12, FOLATE, FERRITIN, TIBC, IRON, RETICCTPCT in the last 72 hours.  Coagulation profile  Recent Labs Lab 09/15/17 1626  INR 1.17    No results for input(s): DDIMER in the last 72 hours.  Cardiac Enzymes  Recent Labs Lab 09/17/17 1604 09/17/17 2121 09/18/17 0230  TROPONINI <0.03 <0.03 <0.03   ------------------------------------------------------------------------------------------------------------------    Component Value Date/Time    BNP 57.6 09/15/2017 1626   BNP 52.8 12/25/2014 1343    Inpatient Medications  Scheduled Meds: . amiodarone  100 mg Oral BID  . apixaban  5 mg Oral BID  . cholecalciferol  1,000 Units Oral Daily  . feeding supplement (PRO-STAT SUGAR FREE 64)  30 mL Oral BID  . finasteride  5 mg Oral Daily  . furosemide  40 mg Oral Daily  . levothyroxine  125 mcg Oral QAC breakfast  . loratadine  10 mg Oral Daily  . metoprolol tartrate  25 mg Oral BID  . multivitamin with minerals  1 tablet Oral Daily  . pantoprazole  40 mg Oral Daily  . [START ON 09/20/2017] predniSONE  50 mg Oral Q breakfast  . psyllium  1 packet Oral Daily  . sacubitril-valsartan  1 tablet Oral BID  . sodium chloride flush  3 mL Intravenous Q12H  . tamsulosin  0.4 mg Oral QHS   Continuous Infusions: . sodium chloride    . cefTRIAXone (ROCEPHIN)  IV Stopped (09/19/17 0457)   PRN Meds:.sodium chloride, acetaminophen **OR** acetaminophen, albuterol, albuterol, benzonatate, nitroGLYCERIN, polyethylene glycol, promethazine, sodium chloride flush  Micro Results Recent Results (from the past 240 hour(s))  Respiratory Panel by PCR     Status: None   Collection Time: 09/15/17  8:19 PM  Result Value Ref Range Status   Adenovirus NOT DETECTED NOT DETECTED Final   Coronavirus 229E NOT DETECTED NOT DETECTED Final   Coronavirus HKU1 NOT DETECTED NOT DETECTED Final   Coronavirus NL63 NOT DETECTED NOT DETECTED Final   Coronavirus OC43 NOT DETECTED NOT DETECTED Final   Metapneumovirus NOT DETECTED NOT DETECTED Final   Rhinovirus / Enterovirus NOT DETECTED NOT DETECTED Final   Influenza A NOT DETECTED NOT DETECTED Final   Influenza B NOT DETECTED NOT DETECTED Final   Parainfluenza Virus 1 NOT DETECTED NOT DETECTED Final   Parainfluenza Virus 2 NOT DETECTED NOT DETECTED Final   Parainfluenza Virus 3 NOT DETECTED NOT DETECTED Final   Parainfluenza Virus 4 NOT DETECTED NOT DETECTED Final   Respiratory Syncytial Virus NOT DETECTED NOT  DETECTED Final   Bordetella pertussis NOT DETECTED NOT DETECTED Final   Chlamydophila pneumoniae NOT DETECTED NOT DETECTED Final   Mycoplasma pneumoniae NOT DETECTED NOT DETECTED Final    Comment: Performed at [facility], [place] [address], [place], [place] [address]    Radiology Reports Dg Chest 2 View  Result Date: 09/15/2017 CLINICAL DATA:  Shortness of breath and congestion for 2 weeks EXAM: CHEST  2 VIEW COMPARISON:  09/03/2017 FINDINGS: Cardiac shadow is stable. Defibrillator is again noted. Mild interstitial changes are seen with basilar scarring. No focal infiltrate is noted. Very mild interstitial edema is present. No acute bony abnormality is seen. IMPRESSION: Mild interstitial edema. Chronic changes in the bases bilaterally. Electronically Signed   By: Inez Catalina  M.D.   On: 09/15/2017 19:59   Dg Chest 2 View  Result Date: 09/03/2017 CLINICAL DATA:  Cough and congestion and patient with history of COPD. EXAM: CHEST  2 VIEW COMPARISON:  PA and lateral chest 11/08/2016 and 04/08/2016. FINDINGS: The lungs are emphysematous with basilar scar, more notable on the left. The appearance is unchanged. No pneumothorax or pleural effusion. Heart size is normal. Aortic atherosclerosis is noted. Paced device in place. No acute bony abnormality. IMPRESSION: COPD without acute disease. Electronically Signed   By: Inge Rise M.D.   On: 09/03/2017 17:51    Time Spent in minutes  30   [name] M.D on 09/19/2017 at 8:34 AM  Between 7am to 7am - Pager - [phone]

## 2017-09-19 NOTE — Care Management Important Message (Signed)
Important Message  Patient Details  Name: ANASTASIOS MELANDER MRN: [id number] Date of Birth: 19-Jan-1941   Medicare Important Message Given:  Yes    Erenest Rasher, RN 09/19/2017, 12:39 PM

## 2017-09-19 NOTE — Progress Notes (Signed)
Subjective:  Breathing is much improved today.  Less coughing. Feels much better today.  No chest pain. Wants to go home. Only mild nonsustained VT seen.  Objective:  Vital Signs in the last 24 hours: BP 105/77 (BP Location: Left Arm)   Pulse 63   Temp 98.1 F (36.7 C) (Oral)   Resp 16   Wt 93.4 kg (205 lb 14.6 oz)   SpO2 95%   BMI 27.17 kg/m   Physical Exam: Pleasant male currently in no acute distress lying in bed Lungs:  Clear  Cardiac:  Regular rhythm, normal S1 and S2, no S3 Extremities:  No edema present  Intake/Output from previous day: 10/27 0701 - 10/28 0700 In: 770 [P.O.:720; IV Piggyback:50] Out: 300 [Urine:300] Weight Filed Weights   09/16/17 0500 09/18/17 0448 09/19/17 0427  Weight: 93 kg (205 lb 0.4 oz) 93.1 kg (205 lb 4.8 oz) 93.4 kg (205 lb 14.6 oz)    Lab Results: Basic Metabolic Panel:  Recent Labs  09/18/17 0230 09/19/17 0549  NA 136 138  K 3.9 4.5  CL 100* 101  CO2 27 32  GLUCOSE 160* 111*  BUN 29* 31*  CREATININE 1.12 1.08    CBC:  Recent Labs  09/17/17 0551 09/18/17 0230  WBC 15.0* 15.7*  HGB 13.6 13.5  HCT 40.8 39.6  MCV 94.9 94.1  PLT 236 228    BNP    Component Value Date/Time   BNP 57.6 09/15/2017 1626   BNP 52.8 12/25/2014 1343    Cardiac Panel (last 3 results)  Recent Labs  09/17/17 1604 09/17/17 2121 09/18/17 0230  TROPONINI <0.03 <0.03 <0.03   Telemetry: Personally reviewed.  Episode of ventricular tachycardia nunsutainted 3 and 4 beats.terminated by pacing earlier today.  sinus rhythm with ventricular paced beats.  Some PVCs noted.  Assessment/Plan:  1.  Paroxysmal ventricular tachycardia-appears to settled down some with changes made Friday  Dr. Sallyanne Tyler mentions does not wish to have additional ablation or increase amiodarone.  Currently on beta blocker started yesterday.- 2.  Minimal CAD 3.  COPD with exacerbation 4.  Amiodarone-related hypothyroidism previously  treated  Recommendations:  Clinically is much better.  Discharge on beta blocker and avoid Zithromax.  OK to go home from card viewpoint when stable from pulmonary.  Should f/u with Dr. Rayann Heman in 2 weeks.    Corey Hough  MD [facility]  09/19/2017, 9:44 AM

## 2017-09-19 NOTE — Progress Notes (Signed)
Discharge instructions reviewed with patient utilizing teach back method no questions at this time. Patient discharged to home. ?

## 2017-09-20 ENCOUNTER — Telehealth: Payer: Self-pay | Admitting: *Deleted

## 2017-09-20 NOTE — Telephone Encounter (Signed)
Called patient regarding VT treated with ATP from 09/18/17. Patient states that he was hospitalized when he had the episode. Patient states that he feels much better since discharge. Informed patient about driving restrictions. Patient verbalized understanding.

## 2017-10-08 ENCOUNTER — Telehealth: Payer: Self-pay

## 2017-10-08 NOTE — Telephone Encounter (Signed)
LVM on pts home and cell phone regarding ATP episodes from 11/13 at 2:30pm

## 2017-10-11 ENCOUNTER — Telehealth: Payer: Self-pay

## 2017-10-11 ENCOUNTER — Encounter: Payer: Self-pay | Admitting: Internal Medicine

## 2017-10-11 NOTE — Telephone Encounter (Signed)
LVM on pt's cell and home phone regarding ATP episodes over weekend.

## 2017-10-12 ENCOUNTER — Other Ambulatory Visit: Payer: Self-pay

## 2017-10-12 ENCOUNTER — Telehealth: Payer: Self-pay | Admitting: Internal Medicine

## 2017-10-12 DIAGNOSIS — R972 Elevated prostate specific antigen [PSA]: Secondary | ICD-10-CM | POA: Diagnosis not present

## 2017-10-12 DIAGNOSIS — E039 Hypothyroidism, unspecified: Secondary | ICD-10-CM | POA: Diagnosis not present

## 2017-10-12 DIAGNOSIS — I2510 Atherosclerotic heart disease of native coronary artery without angina pectoris: Secondary | ICD-10-CM | POA: Diagnosis not present

## 2017-10-12 DIAGNOSIS — J449 Chronic obstructive pulmonary disease, unspecified: Secondary | ICD-10-CM | POA: Diagnosis not present

## 2017-10-12 DIAGNOSIS — I472 Ventricular tachycardia: Secondary | ICD-10-CM | POA: Diagnosis not present

## 2017-10-12 DIAGNOSIS — F321 Major depressive disorder, single episode, moderate: Secondary | ICD-10-CM | POA: Diagnosis not present

## 2017-10-12 MED ORDER — RANOLAZINE ER 500 MG PO TB12: 500.0000 mg | ORAL_TABLET | Freq: Two times a day (BID) | ORAL | 1 refills | Status: DC

## 2017-10-12 NOTE — Telephone Encounter (Signed)
New Message     Pt is returning [place] call

## 2017-10-12 NOTE — Telephone Encounter (Signed)
Spoke with patient and explained that Dr. [name] would like him to begin taking ranolazine 500mg  BID and to keep his appointment for 11/29. Patient was agreeable to this plain. Confirmed that medication would be sent to the [facility] listed.

## 2017-10-12 NOTE — Telephone Encounter (Signed)
Spoke with patients wife who reports that over the past couple of days patient has had intermittent episodes of feeling bad where he gets weak and feels like he might pass out. She states that his episodes come and go suddenly and that he is taking all medication as prescribed. She confirmed his OV with SK on 11/29. I will review with SK and call back with additional recommendations. Wife verbalized understanding.

## 2017-10-21 ENCOUNTER — Ambulatory Visit: Payer: PPO | Admitting: Internal Medicine

## 2017-10-21 ENCOUNTER — Encounter: Payer: Self-pay | Admitting: Internal Medicine

## 2017-10-21 VITALS — BP 98/60 | HR 71 | Ht 73.0 in | Wt 216.4 lb

## 2017-10-21 DIAGNOSIS — I5022 Chronic systolic (congestive) heart failure: Secondary | ICD-10-CM

## 2017-10-21 DIAGNOSIS — I255 Ischemic cardiomyopathy: Secondary | ICD-10-CM

## 2017-10-21 DIAGNOSIS — I472 Ventricular tachycardia: Secondary | ICD-10-CM | POA: Diagnosis not present

## 2017-10-21 DIAGNOSIS — Z95810 Presence of automatic (implantable) cardiac defibrillator: Secondary | ICD-10-CM

## 2017-10-21 DIAGNOSIS — I4729 Other ventricular tachycardia: Secondary | ICD-10-CM

## 2017-10-21 LAB — CUP PACEART INCLINIC DEVICE CHECK
Brady Statistic RV Percent Paced: 0.25 %
HIGH POWER IMPEDANCE MEASURED VALUE: 81 Ohm
Implantable Lead Location: 753860
Lead Channel Impedance Value: 412.5 Ohm
Lead Channel Pacing Threshold Amplitude: 0.5 V
Lead Channel Pacing Threshold Pulse Width: 0.5 ms
Lead Channel Pacing Threshold Pulse Width: 0.5 ms
Lead Channel Pacing Threshold Pulse Width: 0.6 ms
Lead Channel Pacing Threshold Pulse Width: 0.6 ms
Lead Channel Sensing Intrinsic Amplitude: 3.9 mV
Lead Channel Setting Sensing Sensitivity: 0.5 mV
MDC IDC LEAD IMPLANT DT: 20130213
MDC IDC LEAD IMPLANT DT: 20130213
MDC IDC LEAD LOCATION: 753859
MDC IDC MSMT BATTERY REMAINING LONGEVITY: 39 mo
MDC IDC MSMT LEADCHNL RA IMPEDANCE VALUE: 437.5 Ohm
MDC IDC MSMT LEADCHNL RA PACING THRESHOLD AMPLITUDE: 0.5 V
MDC IDC MSMT LEADCHNL RV PACING THRESHOLD AMPLITUDE: 0.75 V
MDC IDC MSMT LEADCHNL RV PACING THRESHOLD AMPLITUDE: 0.75 V
MDC IDC MSMT LEADCHNL RV SENSING INTR AMPL: 12 mV
MDC IDC PG IMPLANT DT: 20130213
MDC IDC SESS DTM: 20181129142854
MDC IDC SET LEADCHNL RA PACING AMPLITUDE: 1.5 V
MDC IDC SET LEADCHNL RV PACING AMPLITUDE: 2.5 V
MDC IDC SET LEADCHNL RV PACING PULSEWIDTH: 0.6 ms
MDC IDC STAT BRADY RA PERCENT PACED: 52 %
Pulse Gen Serial Number: 7003419

## 2017-10-21 NOTE — Progress Notes (Signed)
Patient Care Team: [name], MD as PCP - General (Internal Medicine)   HPI  Corey Tyler is a 76 y.o. male Seen in followup for ICD implantation 2/13 for primary prevention. This occurred in the context of ischemic/nonischemic cardiomyopathy with underlying bradycardia.    December 2014 he had appropriate ICD discharge for polymorphic ventricular tachycardia. His Coreg was up titrated.   Subsequent Myoview scan was not gated; it demonstrated a large inferolateral and anterolateral scar without ischemia.  He also has a history of atrial fibrillation. In the past he was treated with Rivaroxaban which was complicated by GI bleeding. He was then treated with apixaban. He is also been treated with amiodarone.  12/17 he was admitted with sustained ventricular tachycardia below his detection rate. Amiodarone was uptitrated device was reprogrammed.    11/18 Interval ventricular tachycardia prompted the recent introduction of ranolazine adjunctive to his amiodarone;  No further VT and he feels better   Breathing is better he has noted some increased swelling.  His diuretics were not working all that well.        DATE TEST    2008 echo EF40-45%   1/14 echo   EF 15 %   8/17 echo   EF 25 %   10/18 Echo  EF 45-50%       Date TSH LFTs PFTs  5/15  8.63 23   12/17  4.55 18   10/18 1.603 27         Past Medical History:  Diagnosis Date  . A-fib ([place])    a. Noted on 12/2012 interrogation, placed on Apixaban and ultimately stopped NOACs 2/2 personal decision 8/14.  Marland Kitchen AICD (automatic cardioverter/defibrillator) present    Cardiac defibrillator -dual  [employer]  . Arthritis   . Atrial tachycardia-non sustained    a. Noted on 03/2012 interrogation.  . Chronic systolic heart failure (HCC)    EF down to 20 to 25% per echo November 2012  . COPD (chronic obstructive pulmonary disease) ([place])   . Hyperlipidemia   . Hypertension   . ICD (implantable cardiac defibrillator) in place    . NICM (nonischemic cardiomyopathy) ([place])    a. Normal cors 2000. b. Minimal plaque 2013.  . Old myocardial infarction    a. ?Silent MI. b. Large fixed inferolateral defect c/w with prior infarct, no ischemia. c. Cath in 2000/2013 with only minimal CAD.  Marland Kitchen Presence of permanent cardiac pacemaker   . Pulmonary nodule, right    Last scan in 2010 showing stability; felt to be benign.  Marland Kitchen PVC's (premature ventricular contractions)   . Ventricular tachycardia, polymorphic ([place])    Rx via ICD 12/14    Past Surgical History:  Procedure Laterality Date  . APPENDECTOMY    . CARDIAC CATHETERIZATION  2000  . CARDIAC DEFIBRILLATOR PLACEMENT    . CARDIOVERSION  03/30/2013   Procedure: CARDIOVERSION;  Surgeon: Thayer Headings, MD;  Location: [facility];  Service: Cardiovascular;;  . COLON SURGERY    . COLONOSCOPY N/A 09/25/2013   Procedure: COLONOSCOPY;  Surgeon: Jerene Bears, MD;  Location: [facility] ENDOSCOPY;  Service: Endoscopy;  Laterality: N/A;  . ICD  12/2011   [name]  . IMPLANTABLE CARDIOVERTER DEFIBRILLATOR IMPLANT N/A 01/06/2012   Procedure: IMPLANTABLE CARDIOVERTER DEFIBRILLATOR IMPLANT;  Surgeon: Deboraha Sprang, MD;  Location: [facility] CATH LAB;  Service: Cardiovascular;  Laterality: N/A;  . TEE WITHOUT CARDIOVERSION N/A 03/30/2013   Procedure: TRANSESOPHAGEAL ECHOCARDIOGRAM (TEE);  Surgeon: Wonda Cheng Nahser,  MD;  Location: [facility];  Service: Cardiovascular;  Laterality: N/A;  . V TACH ABLATION  06/10/2017  . V TACH ABLATION N/A 06/10/2017   Procedure: V Tach Ablation;  Surgeon: Evans Lance, MD;  Location: [facility] CV LAB;  Service: Cardiovascular;  Laterality: N/A;    Current Outpatient Medications  Medication Sig Dispense Refill  . albuterol (PROVENTIL) (2.5 MG/3ML) 0.083% nebulizer solution Take 3 mLs (2.5 mg total) by nebulization every 6 (six) hours as needed for wheezing or shortness of breath. 75 mL 4  . Albuterol Sulfate (PROAIR RESPICLICK) 580 (90 Base) MCG/ACT AEPB Inhale 2 puffs  into the lungs every 6 (six) hours as needed (shortness of breath/ wheezing).    . Amino Acids-Protein Hydrolys (FEEDING SUPPLEMENT, PRO-STAT SUGAR FREE 64,) LIQD Take 30 mLs by mouth 2 (two) times daily. 900 mL 0  . amiodarone (PACERONE) 200 MG tablet Take 1 tablet (200 mg total) by mouth daily. 400mg  by mouth twice daily x 2 weeks, 400mg  by mouth once daily x 2 weeks and then resume 200mg  by mouth daily. (Patient taking differently: Take 100 mg by mouth 2 (two) times daily. ) 90 tablet 1  . azelastine (ASTELIN) 0.1 % nasal spray Place 1 spray into both nostrils 2 (two) times daily.     . Carboxymethylcellul-Glycerin (LUBRICATING EYE DROPS OP) Apply 1 drop to eye daily as needed (dry eyes).    . cholecalciferol (VITAMIN D) 1000 UNITS tablet Take 1,000 Units by mouth daily.    Marland Kitchen ELIQUIS 5 MG TABS tablet TAKE 1 TABLET BY MOUTH TWICE A DAY 60 tablet 5  . finasteride (PROSCAR) 5 MG tablet Take 5 mg by mouth daily.     . fluticasone (FLONASE) 50 MCG/ACT nasal spray Place 1 spray into both nostrils daily.     . furosemide (LASIX) 40 MG tablet TAKE ONE TABLET BY MOUTH ONCE DAILY 90 tablet 3  . levothyroxine (SYNTHROID, LEVOTHROID) 125 MCG tablet Take 125 mcg by mouth daily.    Marland Kitchen loratadine (CLARITIN) 10 MG tablet Take 10 mg by mouth daily.    . Melatonin 5 MG CAPS Take 5 mg by mouth at bedtime.     . metoprolol tartrate (LOPRESSOR) 25 MG tablet Take 1 tablet (25 mg total) by mouth 2 (two) times daily. 60 tablet 0  . Multiple Vitamin (MULTIVITAMIN WITH MINERALS) TABS tablet Take 1 tablet by mouth daily.    . nitroGLYCERIN (NITROSTAT) 0.4 MG SL tablet Place 0.4 mg under the tongue every 5 (five) minutes as needed for chest pain.     . OXYGEN Inhale 2 L into the lungs See admin instructions. Use oxygen whenever resting    . pantoprazole (PROTONIX) 40 MG tablet Take 1 tablet (40 mg total) by mouth daily. 31 tablet 11  . polyethylene glycol (MIRALAX / GLYCOLAX) packet Take 17 g by mouth daily as needed for  mild constipation or moderate constipation. 14 each 0  . POTASSIUM PO Take 1 tablet by mouth at bedtime.    . predniSONE (DELTASONE) 10 MG tablet 50mg  po qday x 2 days then 40mg  po qday x 2 days then 30mg  po qday x 2 days then 20mg  po qday x 2 days then 10mg  po qday x 2 days 30 tablet 0  . promethazine (PHENERGAN) 25 MG tablet Take 25 mg by mouth daily as needed for nausea or vomiting.     . Psyllium (METAMUCIL PO) Take 1 Dose by mouth daily. Mix in liquid and drink     .  ranolazine (RANEXA) 500 MG 12 hr tablet Take 1 tablet (500 mg total) by mouth 2 (two) times daily. 60 tablet 1  . sacubitril-valsartan (ENTRESTO) 24-26 MG Take 1 tablet by mouth 2 (two) times daily. 60 tablet 11  . Tamsulosin HCl (FLOMAX) 0.4 MG CAPS Take 0.4 mg by mouth at bedtime.      No current facility-administered medications for this visit.     Allergies  Allergen Reactions  . Xarelto [Rivaroxaban] Other (See Comments)    Bleeding  . Adhesive [Tape] Hives, Itching and Rash  . Atorvastatin Cough  . Codeine     nauseated  . Isosorbide Nitrate Other (See Comments)    Made him feel bad  . Latex Itching and Other (See Comments)    Rash, itching, burning  . Levofloxacin Other (See Comments)    Tachycardia  . Lisinopril Cough  . Lorazepam Other (See Comments)    "felt bad"  . Sertraline Other (See Comments)    Made him feel bad  . Tiotropium Bromide Monohydrate Swelling    Throat swelling    Review of Systems negative except from HPI and PMH  Physical Exam There were no vitals taken for this visit. Well developed and nourished in no acute distress HENT normal Neck supple with JVP-flat Clear Device pocket well healed; without hematoma or erythema.  There is no tethering  Regular rate and rhythm, no murmurs or gallops Abd-soft with active BS No Clubbing cyanosis right greater than left plus edema Skin-warm and dry healing excoriation right shin with surrounding erythema A & Oriented  Grossly normal  sensory and motor function Device pocket well healed; without hematoma or erythema.  There is no tethering   ECG ordered today demonstrates  atrial pacing at 63 Intervals 22/11/45 IMI  Assessment and plan  Atrial fibrillation paroxysmal/persistent   High risk medication  Chronotropic incompetence   Implantable defibrillator-[name]  The patient's device was interrogated and the information was fully reviewed.  The device was reprogrammed as below  Ischemic/nonischemic cardiomyopathy  Congestive heart failure-chronic-systolic  Ventricular tachycardia--recurrent   Hypotension    BP stable and without symptoms  VT is quiet on ranolazine.  We will continue.  No clear trigger.  There may be microscopic ischemic pain which resolves he may benefit as well.  Amiodarone surveillance laboratories were fine last month.  Volume overloaded.  He is now responding to his diuretics.  I have suggested that they take an extra one if he does not continue to diurese back to his euvolemic state.  Without symptoms of ischemia

## 2017-10-21 NOTE — Patient Instructions (Signed)
Medication Instructions: Your physician recommends that you continue on your current medications as directed. Please refer to the Current Medication list given to you today.  Labwork: None Ordered  Procedures/Testing: None Ordered  Follow-Up: Your physician recommends that you schedule a follow-up appointment in: 2 MONTHS with Chanetta Marshall, NP   Any Additional Special Instructions Will Be Listed Below (If Applicable).     If you need a refill on your cardiac medications before your next appointment, please call your pharmacy.

## 2017-11-08 ENCOUNTER — Telehealth: Payer: Self-pay

## 2017-11-08 NOTE — Telephone Encounter (Signed)
Spoke with pt regarding ATP episodes from 12/15 at @ 9:45am. Pt reported compliance with medications. Pt denied passing out, pt reported just feeling weak and weird around that time. Informed pt if there were any further recommendations from Dr. [name] he would receive a call back. Pt voiced understanding pt aware of driving restrictions.

## 2017-11-10 ENCOUNTER — Telehealth: Payer: Self-pay

## 2017-11-10 NOTE — Telephone Encounter (Signed)
Reviewed episodes with Dr. [name] who recommended Increasing patients Ranexa from 500mg  BID to 100mg  BID. I relayed this information to the patient who verbalized understanding. He reports taking his blood pressure daily with results around 111/68 consistently. I requested that he continue to monitor his BP daily at the request of Dr. [name]. Patient verbalized understanding.   Patient additionally reports weight gain and fluid retention for which he has been increasing his Lasix dose to 1.5 tabs every other day which helps "somehwat". He reports taking his potassium as prescribed. He additionally notes swelling in his feet and ankles beginning sometime after his appt on 11/29.

## 2017-11-15 ENCOUNTER — Telehealth: Payer: Self-pay

## 2017-11-15 NOTE — Telephone Encounter (Addendum)
Corey Tyler had a total of 4  VT-1 zone episodes successfully terminated with ATP between  three episodes on 12/19 and one on 12/21.

## 2017-11-15 NOTE — Telephone Encounter (Signed)
Spoke with pt regarding episodes of VT in 126-131bpm range from 12/19 and 12/21, Mr. Binion doesn't recall anything specific from dates and times. He denies any syncopal episodes. He states that he gets a little dizzy going from sitting to standing position, he notes some "weak" episodes but says they do not last long so he does not take note of the dates and times. Pt stated that he feels better since increasing of the Ranexa 500mg  to BID. Pt reports that he has been taking his BP and it has not been affected by the Ranexa.

## 2017-11-19 ENCOUNTER — Ambulatory Visit (INDEPENDENT_AMBULATORY_CARE_PROVIDER_SITE_OTHER): Payer: PPO | Admitting: *Deleted

## 2017-11-19 DIAGNOSIS — I255 Ischemic cardiomyopathy: Secondary | ICD-10-CM | POA: Diagnosis not present

## 2017-11-19 NOTE — Progress Notes (Signed)
Remote ICD transmission.   

## 2017-11-21 ENCOUNTER — Other Ambulatory Visit: Payer: Self-pay | Admitting: Internal Medicine

## 2017-11-22 ENCOUNTER — Encounter: Payer: Self-pay | Admitting: Cardiology

## 2017-11-25 ENCOUNTER — Telehealth: Payer: Self-pay | Admitting: Internal Medicine

## 2017-11-25 MED ORDER — RANOLAZINE ER 1000 MG PO TB12: 1000.0000 mg | ORAL_TABLET | Freq: Two times a day (BID) | ORAL | 6 refills | Status: DC

## 2017-11-25 NOTE — Telephone Encounter (Signed)
Jimmey Ralph, RN      11/10/17 3:14 PM  Note    Reviewed episodes with Dr. [name] who recommended Increasing patients Ranexa from 500mg  BID to 100mg  BID. I relayed this information to the patient who verbalized understanding. He reports taking his blood pressure daily with results around 111/68 consistently. I requested that he continue to monitor his BP daily at the request of Dr. [name]. Patient verbalized understanding.

## 2017-11-25 NOTE — Telephone Encounter (Signed)
Copied from phone note 11/10/17.  Refill for prescription for ranexa 500mg  bid sent to [facility] 11/22/17-per phone note 11/10/17 dose had been increased by Dr [name] for 1000 mg bid pharmacy needs updated prescription to insure insurance coverage.  I verified with patient that he is taking ranexa 1000 mg bid as recommended by Dr [name] 11/10/17, advised pt and April that I will send in updated prescription for ranexa 1000mg  bid, pt requesting 30 day prescription.

## 2017-11-25 NOTE — Telephone Encounter (Signed)
New Message  Pt c/o medication issue:  1. Name of Medication: Ranexa   2. How are you currently taking this medication (dosage and times per day)? Does is incorrect   3. Are you having a reaction (difficulty breathing--STAT)? no  4. What is your medication issue? April from [facility] states pts was told his dosage would be changed for medication. She states the pts insurance is not going to cover the quanity change. Pt will need the dosage change for the insurance to cover the medication. Please call back to discuss

## 2017-11-25 NOTE — Telephone Encounter (Signed)
[place] ( [facility] ) is calling to verify that  Mr. Rao is on Oxygen . Please call   Thanks

## 2017-11-25 NOTE — Telephone Encounter (Signed)
Callled patient to confirm that he is still seeing pulmonologist at [facility] as listed in his chart. He confirmed that he is and that he already spoke with [facility] to update them with that Physicians information and to let them know that we are his "heart doctor". Patient has already given [facility] his Pulmonologist information therefore no need for me to follow up at this time. Both pt and I are agreeable.

## 2017-12-02 ENCOUNTER — Telehealth: Payer: Self-pay | Admitting: *Deleted

## 2017-12-02 MED ORDER — AMIODARONE HCL 200 MG PO TABS: 400.0000 mg | ORAL_TABLET | Freq: Every day | ORAL | 0 refills | Status: DC

## 2017-12-02 MED ORDER — AMIODARONE HCL 200 MG PO TABS: 200.0000 mg | ORAL_TABLET | Freq: Every day | ORAL | 3 refills | Status: DC

## 2017-12-02 MED ORDER — MEXILETINE HCL 150 MG PO CAPS: 150.0000 mg | ORAL_CAPSULE | Freq: Two times a day (BID) | ORAL | 11 refills | Status: DC

## 2017-12-02 NOTE — Telephone Encounter (Signed)
Reviewed episodes with Dr. [name]. He would like to add Mexilitine 150mg  BID and increase amiodarone to 400mg  daily x 1 month then return to 200mg  daily dose. Corey Tyler is agreeable but expresses discontent with the amount of medications he is taking. I explained that we can evaluate the effectiveness of these medications at his 12/29/17 OV with Chanetta Marshall, NP. He verbalizes understanding. Driving restrictions still in effect.

## 2017-12-02 NOTE — Telephone Encounter (Signed)
VT episodes with ATP treated this morning shortly after 7am. Corey Tyler reports that he was dizzy and felt like he may pass out. No missed meds. I will review with Dr. [name] this afternoon and call [name] if there are any other recommendations. 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

## 2017-12-06 ENCOUNTER — Telehealth: Payer: Self-pay | Admitting: Internal Medicine

## 2017-12-06 NOTE — Telephone Encounter (Signed)
New message    Pt c/o medication issue:  1. Name of Medication:   mexiletine (MEXITIL) 150 MG capsule Take 1 capsule (150 mg total) by mouth 2 (two) times daily.     2. How are you currently taking this medication (dosage and times per day)? 1 capsule 2 x a day   3. Are you having a reaction (difficulty breathing--STAT)? yes  4. What is your medication issue?  Medication is burning his stomach and his throat

## 2017-12-06 NOTE — Telephone Encounter (Signed)
Called patient about pharmacy recommendations. Patient stated he has tried all these things and nothing helps. Patient stated at this time he is not going to take Mexitil. Will forward to Tommye Standard PA for advisement since Dr. [name] is out.

## 2017-12-06 NOTE — Telephone Encounter (Signed)
Acid reflux can occur with mexiletine - would advise pt to take medication with food (or with milk or antacid) to see if this helps.

## 2017-12-06 NOTE — Telephone Encounter (Signed)
Called patient with Tommye Standard PA recommendations. Per Joseph Art, if he can not tolerate the mexiletine despite taking with food please make sure he is on the higher dose of amiodarone as directed, 400mg  daily and keep his appointment with Amber 12/29/16.  If any symptoms near syncope to call, syncope or shocks to call 911. Patient verbalized understanding.

## 2017-12-07 LAB — CUP PACEART REMOTE DEVICE CHECK
Battery Voltage: 2.87 V
Brady Statistic AP VP Percent: 1 %
Brady Statistic RA Percent Paced: 57 %
Brady Statistic RV Percent Paced: 1 %
Date Time Interrogation Session: 20181229145353
HIGH POWER IMPEDANCE MEASURED VALUE: 73 Ohm
HighPow Impedance: 73 Ohm
Implantable Lead Implant Date: 20130213
Implantable Lead Location: 753859
Implantable Pulse Generator Implant Date: 20130213
Lead Channel Pacing Threshold Pulse Width: 0.6 ms
Lead Channel Sensing Intrinsic Amplitude: 12 mV
Lead Channel Setting Pacing Amplitude: 1.5 V
Lead Channel Setting Pacing Amplitude: 2.5 V
MDC IDC LEAD IMPLANT DT: 20130213
MDC IDC LEAD LOCATION: 753860
MDC IDC MSMT BATTERY REMAINING LONGEVITY: 35 mo
MDC IDC MSMT BATTERY REMAINING PERCENTAGE: 36 %
MDC IDC MSMT LEADCHNL RA IMPEDANCE VALUE: 430 Ohm
MDC IDC MSMT LEADCHNL RA PACING THRESHOLD AMPLITUDE: 0.5 V
MDC IDC MSMT LEADCHNL RA PACING THRESHOLD PULSEWIDTH: 0.5 ms
MDC IDC MSMT LEADCHNL RA SENSING INTR AMPL: 3.1 mV
MDC IDC MSMT LEADCHNL RV IMPEDANCE VALUE: 350 Ohm
MDC IDC MSMT LEADCHNL RV PACING THRESHOLD AMPLITUDE: 0.75 V
MDC IDC SET LEADCHNL RV PACING PULSEWIDTH: 0.6 ms
MDC IDC SET LEADCHNL RV SENSING SENSITIVITY: 0.5 mV
MDC IDC STAT BRADY AP VS PERCENT: 57 %
MDC IDC STAT BRADY AS VP PERCENT: 1 %
MDC IDC STAT BRADY AS VS PERCENT: 43 %
Pulse Gen Serial Number: 7003419

## 2017-12-10 ENCOUNTER — Telehealth: Payer: Self-pay | Admitting: Internal Medicine

## 2017-12-10 NOTE — Telephone Encounter (Signed)
Tried to get patient to send a transmission to the device clinic for Dr. Rayann Heman to evaluate.  Finally reached the patient and informed him to transmit his device.  Patient presently sending.

## 2017-12-10 NOTE — Telephone Encounter (Signed)
New Message   Pt c/o medication issue:  1. Name of Medication: mexiletine (MEXITIL) 2. How are you currently taking this medication (dosage and times per day)? 150 mg twice a day  3. Are you having a reaction (difficulty breathing--STAT)?no  4. What is your medication issue? Patient states that he feels like he is going to fall and this morning he did fall. It effects his balance.

## 2017-12-10 NOTE — Telephone Encounter (Addendum)
Spoke with patient regarding his medication (Mexiletine) Mexitil.   He started taking it on 1/10.  It started making his throat and stomach burn so he stopped taking on 1/12.   He resumed taking on 1/14, but told his wife it made him dizzy, feeling like he may fall down.  Well unfortunately, he fell in his foyer at home this morning suffering bruises on his arms and hip.  He refuses to continue medication until we figure this out because he doesn't want to keep feeling like this. I will forward to Dr. [name].

## 2017-12-10 NOTE — Telephone Encounter (Signed)
Spoke with patient and informed him that his monitor looked uneventful per Dr. Rayann Heman.  Also, the patient may discontinue his Mexilitine (patient preference) per Dr. Rayann Heman.  Will f/u with patient on Monday.  Also informed patient to go to the ER if dizziness (feel like falling) persists.

## 2017-12-10 NOTE — Telephone Encounter (Signed)
Attempted to call patient on home and cell #'s to instruct him to send. N/A, LMTCB

## 2017-12-13 NOTE — Telephone Encounter (Signed)
Spoke with the patient to follow up on how he felt over the weekend.  He commented that he felt great since stopping the Mexilitine on Friday (per Dr. Allred/DOD on Friday.  "No dizziness or feeling like he may fall" has occurred.  If symptoms reoccur, patient will give [place] a call.

## 2017-12-15 ENCOUNTER — Telehealth: Payer: Self-pay

## 2017-12-15 NOTE — Telephone Encounter (Signed)
Noted   I have added mex to drug intolerances  Continue amio and ranolazine

## 2017-12-15 NOTE — Telephone Encounter (Signed)
Spoke with patient in response to Dr. Olin Pia advice.  The patient is feeling so much better since stopping the Mexiletine (now listed on allergy list).  Patient verbalized understanding to continue Amiodarone and Ranolazine.

## 2017-12-15 NOTE — Addendum Note (Signed)
Addended by: Frederik Schmidt on: 12/15/2017 03:11 PM   Modules accepted: Orders

## 2017-12-17 ENCOUNTER — Other Ambulatory Visit: Payer: Self-pay | Admitting: Cardiology

## 2017-12-23 ENCOUNTER — Other Ambulatory Visit: Payer: Self-pay | Admitting: Internal Medicine

## 2017-12-28 NOTE — Progress Notes (Signed)
Electrophysiology Office Note Date: 12/30/2017  ID:  Corey, Tyler [birth year], MRN [id number]  PCP: [name], MD  Cardiologist:  Hochrein Electrophysiologist: [name]  CC: VT follow-up  Corey Tyler is a 77 y.o. male seen today for Dr [name].  He presents today for routine electrophysiology followup.  Since last being seen in our clinic, the patient reports doing relatively well.  He has had no further VT since end of January.  He did not tolerate Mexiletine - it made him feel imbalanced.  His symptoms are much improved since stopping. He has gained 20 pounds in the last 4-5 weeks by home scales with abdominal fullness.  He does not have LE edema.  He sleeps in a recliner chronically 2/2 sinus drainage. He feels that Lasix does not work as well as it once did.  He denies chest pain, palpitations, dyspnea, PND, orthopnea, nausea, vomiting, dizziness, syncope.  He has not had ICD shocks.   Device History: STJ dual chamber ICD implanted 2013 for ICM History of appropriate therapy: Yes History of AAD therapy: Yes - amiodarone and Ranexa; intolerant of Mexiletine   Past Medical History:  Diagnosis Date  . A-fib ([place])    a. Noted on 12/2012 interrogation, placed on Apixaban and ultimately stopped NOACs 2/2 personal decision 8/14.  Marland Kitchen AICD (automatic cardioverter/defibrillator) present    Cardiac defibrillator -dual  [facility]  . Arthritis   . Atrial tachycardia-non sustained    a. Noted on 03/2012 interrogation.  . Chronic systolic heart failure (H[facility])    EF down to 20 to 25% per echo November 2012  . COPD (chronic obstructive pulmonary disease) ([place])   . Hyperlipidemia   . Hypertension   . ICD (implantable cardiac defibrillator) in place   . NICM (nonischemic cardiomyopathy) ([place])    a. Normal cors 2000. b. Minimal plaque 2013.  . Old myocardial infarction    a. ?Silent MI. b. Large fixed inferolateral defect c/w with prior infarct, no ischemia. c. Cath in 2000/2013 with only  minimal CAD.  Marland Kitchen Presence of permanent cardiac pacemaker   . Pulmonary nodule, right    Last scan in 2010 showing stability; felt to be benign.  Marland Kitchen PVC's (premature ventricular contractions)   . Ventricular tachycardia, polymorphic ([place])    Rx via ICD 12/14   Past Surgical History:  Procedure Laterality Date  . APPENDECTOMY    . CARDIAC CATHETERIZATION  2000  . CARDIAC DEFIBRILLATOR PLACEMENT    . CARDIOVERSION  03/30/2013   Procedure: CARDIOVERSION;  Surgeon: Thayer Headings, MD;  Location: [facility];  Service: Cardiovascular;;  . COLON SURGERY    . COLONOSCOPY N/A 09/25/2013   Procedure: COLONOSCOPY;  Surgeon: Jerene Bears, MD;  Location: [facility];  Service: Endoscopy;  Laterality: N/A;  . ICD  12/2011   [name]  . IMPLANTABLE CARDIOVERTER DEFIBRILLATOR IMPLANT N/A 01/06/2012   Procedure: IMPLANTABLE CARDIOVERTER DEFIBRILLATOR IMPLANT;  Surgeon: Deboraha Sprang, MD;  Location: [facility] CATH LAB;  Service: Cardiovascular;  Laterality: N/A;  . TEE WITHOUT CARDIOVERSION N/A 03/30/2013   Procedure: TRANSESOPHAGEAL ECHOCARDIOGRAM (TEE);  Surgeon: Thayer Headings, MD;  Location: [facility];  Service: Cardiovascular;  Laterality: N/A;  . V TACH ABLATION  06/10/2017  . V TACH ABLATION N/A 06/10/2017   Procedure: V Tach Ablation;  Surgeon: Evans Lance, MD;  Location: [facility] CV LAB;  Service: Cardiovascular;  Laterality: N/A;    Current Outpatient Medications  Medication Sig Dispense Refill  . albuterol (PROVENTIL) (2.5  MG/3ML) 0.083% nebulizer solution Take 3 mLs (2.5 mg total) by nebulization every 6 (six) hours as needed for wheezing or shortness of breath. 75 mL 4  . Albuterol Sulfate (PROAIR RESPICLICK) 151 (90 Base) MCG/ACT AEPB Inhale 2 puffs into the lungs every 6 (six) hours as needed (shortness of breath/ wheezing).    Marland Kitchen amiodarone (PACERONE) 200 MG tablet Take 2 tablets (400 mg total) by mouth daily. Take 400mg  by mouth daily for 1 month, then resume 200mg  by mouth daily dose. 60  tablet 0  . azelastine (ASTELIN) 0.1 % nasal spray Place 1 spray into both nostrils 2 (two) times daily.     . Carboxymethylcellul-Glycerin (LUBRICATING EYE DROPS OP) Apply 1 drop to eye daily as needed (dry eyes).    . cholecalciferol (VITAMIN D) 1000 UNITS tablet Take 1,000 Units by mouth daily.    Marland Kitchen ELIQUIS 5 MG TABS tablet TAKE 1 TABLET BY MOUTH TWICE A DAY 60 tablet 5  . finasteride (PROSCAR) 5 MG tablet Take 5 mg by mouth daily.     . fluticasone (FLONASE) 50 MCG/ACT nasal spray Place 1 spray into both nostrils daily.     . Fluticasone-Salmeterol (ADVAIR DISKUS) 250-50 MCG/DOSE AEPB Inhale 1 puff into the lungs 2 (two) times daily.    Marland Kitchen levothyroxine (SYNTHROID, LEVOTHROID) 125 MCG tablet Take 125 mcg by mouth daily.    . Melatonin 10 MG TABS Take 10 mg by mouth at bedtime.    . metoprolol tartrate (LOPRESSOR) 25 MG tablet Take 1 tablet (25 mg total) by mouth 2 (two) times daily. 60 tablet 0  . Multiple Vitamin (MULTIVITAMIN WITH MINERALS) TABS tablet Take 1 tablet by mouth daily.    . nitroGLYCERIN (NITROSTAT) 0.4 MG SL tablet Place 0.4 mg under the tongue every 5 (five) minutes as needed for chest pain.     . OXYGEN Inhale 3 L into the lungs See admin instructions. Use oxygen whenever resting    . pantoprazole (PROTONIX) 40 MG tablet Take 1 tablet (40 mg total) by mouth daily. 31 tablet 11  . polyethylene glycol (MIRALAX / GLYCOLAX) packet Take 17 g by mouth daily as needed for mild constipation or moderate constipation. 14 each 0  . POTASSIUM PO Take 1 tablet by mouth at bedtime.    . promethazine (PHENERGAN) 25 MG tablet Take 25 mg by mouth daily as needed for nausea or vomiting.     . Psyllium (METAMUCIL PO) Take 1 Dose by mouth daily. Mix in liquid and drink     . ranolazine (RANEXA) 1000 MG SR tablet Take 1 tablet (1,000 mg total) by mouth 2 (two) times daily. 60 tablet 6  . sacubitril-valsartan (ENTRESTO) 24-26 MG Take 1 tablet by mouth 2 (two) times daily. 60 tablet 11  .  Tamsulosin HCl (FLOMAX) 0.4 MG CAPS Take 0.4 mg by mouth at bedtime.     . vitamin B-12 (CYANOCOBALAMIN) 1000 MCG tablet Take 1,000 mcg by mouth daily.    Marland Kitchen torsemide (DEMADEX) 20 MG tablet Take 1 tablet (20 mg total) by mouth daily. 30 tablet 3   No current facility-administered medications for this visit.     Allergies:   Xarelto [rivaroxaban]; Adhesive [tape]; Atorvastatin; Codeine; Isosorbide nitrate; Latex; Levofloxacin; Lisinopril; Lorazepam; Mexiletine; Sertraline; and Tiotropium bromide monohydrate   Social History: Social History   Socioeconomic History  . Marital status: Married    Spouse name: Not on file  . Number of children: 2  . Years of education: Not on file  .  Highest education level: Not on file  Social Needs  . Financial resource strain: Not on file  . Food insecurity - worry: Not on file  . Food insecurity - inability: Not on file  . Transportation needs - medical: Not on file  . Transportation needs - non-medical: Not on file  Occupational History  . Occupation: Retired    Comment: Press photographer  . Occupation: Works    Fish farm manager: [employer]  Tobacco Use  . Smoking status: Former Smoker    Packs/day: 2.50    Years: 40.00    Pack years: 100.00    Types: Cigarettes    Last attempt to quit: 11/24/1983    Years since quitting: 34.1  . Smokeless tobacco: Never Used  . Tobacco comment: 2 1/2 ppd x 40 years  Substance and Sexual Activity  . Alcohol use: No  . Drug use: No  . Sexual activity: Yes    Birth control/protection: None  Other Topics Concern  . Not on file  Social History Narrative   Still works at [employer] part-time   Lives with Wife in [place]    Family History: Family History  Problem Relation Age of Onset  . Emphysema Mother   . Heart disease Mother        AVR  . Heart failure Father        Died agwe 73    Review of Systems: All other systems reviewed and are otherwise negative except as noted above.   Physical Exam: VS:   BP 128/70   Pulse 60   Ht 6\' 1"  (1.854 m)   Wt 222 lb 6.4 oz (100.9 kg)   SpO2 95%   BMI 29.34 kg/m  , BMI Body mass index is 29.34 kg/m.  GEN- The patient is elderly appearing, alert and oriented x 3 today.   HEENT: normocephalic, atraumatic; sclera clear, conjunctiva pink; hearing intact; oropharynx clear; neck supple  Lungs- Clear to ausculation bilaterally, normal work of breathing.  No wheezes, rales, rhonchi Heart- Regular rate and rhythm  GI- soft, non-tender, non-distended, bowel sounds present  Extremities- no clubbing, cyanosis, or edema  MS- no significant deformity or atrophy Skin- warm and dry, no rash or lesion; ICD pocket well healed Psych- euthymic mood, full affect Neuro- strength and sensation are intact  ICD interrogation- reviewed in detail today,  See PACEART report  EKG:  EKG is ordered today. The ekg ordered today shows atrial pacing with intrinsic ventricular conduction, PR 224msec, rate 60  Recent Labs: 09/15/2017: B Natriuretic Peptide 57.6; TSH 1.603 09/18/2017: Hemoglobin 13.5; Platelets 228 09/19/2017: ALT 25 12/29/2017: BUN 18; Creatinine, Ser 1.31; NT-Pro BNP 381; Potassium 4.1; Sodium 141   Wt Readings from Last 3 Encounters:  12/29/17 222 lb 6.4 oz (100.9 kg)  10/21/17 216 lb 6.4 oz (98.2 kg)  09/19/17 205 lb 14.6 oz (93.4 kg)     Other studies Reviewed: Additional studies/ records that were reviewed today include: Dr Olin Pia office notes   Assessment and Plan:  1.  Chronic systolic dysfunction Likely some volume overloaded  Will stop Lasix and start Torsemide 20mg  daily to see if we can augment diuresis BMET, BNP today, repeat BMET next week Normal ICD function See Pace Art report No changes today  2.  Ventricular tachycardia None since increasing Ranexa Was intolerant of Mexiletine Continue amiodarone No driving Keep T>0.1, Mg >1.8  3.  CAD No recent ischemic symptoms Continue current therapy  He has not had recent ischemic  eval (no cath  since 2000).  If he has recurrent VT or if he does not respond to change in diuretics, consider L/RHC - discussed with patient today who would be willing to proceed.  Could also consider cardiac MRI to evaluate scar burden  Current medicines are reviewed at length with the patient today.   The patient does not have concerns regarding his medicines.  The following changes were made today:  Stop Lasix, start Torsemide 20mg  daily   Labs/ tests ordered today include:  Orders Placed This Encounter  Procedures  . Basic metabolic panel  . Basic metabolic panel  . Pro b natriuretic peptide (BNP)  . CUP PACEART [place]  . EKG 12-Lead     Disposition:   Follow up with Dr [name] in 3 months    Signed, Chanetta Marshall, NP 12/30/2017 9:02 AM  [facility] [address] [place] [address] [phone] (office) [phone] (fax)

## 2017-12-29 ENCOUNTER — Encounter: Payer: Self-pay | Admitting: Nurse Practitioner

## 2017-12-29 ENCOUNTER — Ambulatory Visit (INDEPENDENT_AMBULATORY_CARE_PROVIDER_SITE_OTHER): Payer: PPO | Admitting: Nurse Practitioner

## 2017-12-29 VITALS — BP 128/70 | HR 60 | Ht 73.0 in | Wt 222.4 lb

## 2017-12-29 DIAGNOSIS — I472 Ventricular tachycardia, unspecified: Secondary | ICD-10-CM

## 2017-12-29 DIAGNOSIS — I255 Ischemic cardiomyopathy: Secondary | ICD-10-CM

## 2017-12-29 DIAGNOSIS — I5022 Chronic systolic (congestive) heart failure: Secondary | ICD-10-CM

## 2017-12-29 LAB — CUP PACEART INCLINIC DEVICE CHECK
Date Time Interrogation Session: 20190206113827
Implantable Lead Implant Date: 20130213
Implantable Lead Location: 753859
Implantable Pulse Generator Implant Date: 20130213
MDC IDC LEAD IMPLANT DT: 20130213
MDC IDC LEAD LOCATION: 753860
MDC IDC PG SERIAL: 7003419

## 2017-12-29 MED ORDER — TORSEMIDE 20 MG PO TABS: 20.0000 mg | ORAL_TABLET | Freq: Every day | ORAL | 3 refills | Status: DC

## 2017-12-29 NOTE — Patient Instructions (Addendum)
Medication Instructions:   START TAKING TORSEMIDE  20 MG  ONCE A DAY     If you need a refill on your cardiac medications before your next appointment, please call your pharmacy.  Labwork:  BMET AND BNP TODAY   BMET  NEXT WEEK    Testing/Procedures: NONE ORDERED  TODAY    Follow-Up: SK IN 3 MONTHS    Any Other Special Instructions Will Be Listed Below (If Applicable).

## 2017-12-30 LAB — BASIC METABOLIC PANEL
BUN / CREAT RATIO: 14 (ref 10–24)
BUN: 18 mg/dL (ref 8–27)
CHLORIDE: 100 mmol/L (ref 96–106)
CO2: 29 mmol/L (ref 20–29)
Calcium: 9 mg/dL (ref 8.6–10.2)
Creatinine, Ser: 1.31 mg/dL — ABNORMAL HIGH (ref 0.76–1.27)
GFR calc Af Amer: 60 mL/min/{1.73_m2} (ref >59)
GFR calc non Af Amer: 52 mL/min/{1.73_m2} — ABNORMAL LOW (ref >59)
GLUCOSE: 94 mg/dL (ref 65–99)
Potassium: 4.1 mmol/L (ref 3.5–5.2)
Sodium: 141 mmol/L (ref 134–144)

## 2017-12-30 LAB — PRO B NATRIURETIC PEPTIDE: NT-PRO BNP: 381 pg/mL (ref 0–486)

## 2017-12-31 ENCOUNTER — Telehealth: Payer: Self-pay | Admitting: *Deleted

## 2017-12-31 NOTE — Telephone Encounter (Signed)
-----   Message from Patsey Berthold, NP sent at 12/30/2017  9:12 AM EST ----- Regarding: lab results Please notify patient of lab results - BNP stable. BMP with slightly elevated creatine - could be due to volume overload. I did not see on instructions where he was told to stop Lasix when he started Torsemide - please be sure he knows to only take Torsemide. Track weights, repeat labs next week.    Thanks! Museum/gallery conservator

## 2017-12-31 NOTE — Telephone Encounter (Signed)
Informed patient of results and verbal understanding expressed. He voiced that he was instructed to stop Lasix and only take Torsemide

## 2018-01-05 DIAGNOSIS — R972 Elevated prostate specific antigen [PSA]: Secondary | ICD-10-CM | POA: Diagnosis not present

## 2018-01-05 DIAGNOSIS — I2510 Atherosclerotic heart disease of native coronary artery without angina pectoris: Secondary | ICD-10-CM | POA: Diagnosis not present

## 2018-01-05 DIAGNOSIS — E7800 Pure hypercholesterolemia, unspecified: Secondary | ICD-10-CM | POA: Diagnosis not present

## 2018-01-05 DIAGNOSIS — E039 Hypothyroidism, unspecified: Secondary | ICD-10-CM | POA: Diagnosis not present

## 2018-01-06 ENCOUNTER — Other Ambulatory Visit: Payer: PPO | Admitting: *Deleted

## 2018-01-06 DIAGNOSIS — I5022 Chronic systolic (congestive) heart failure: Secondary | ICD-10-CM | POA: Diagnosis not present

## 2018-01-07 LAB — BASIC METABOLIC PANEL
BUN / CREAT RATIO: 14 (ref 10–24)
BUN: 21 mg/dL (ref 8–27)
CALCIUM: 9 mg/dL (ref 8.6–10.2)
CHLORIDE: 99 mmol/L (ref 96–106)
CO2: 28 mmol/L (ref 20–29)
CREATININE: 1.47 mg/dL — AB (ref 0.76–1.27)
GFR calc Af Amer: 52 mL/min/{1.73_m2} — ABNORMAL LOW (ref >59)
GFR calc non Af Amer: 45 mL/min/{1.73_m2} — ABNORMAL LOW (ref >59)
GLUCOSE: 101 mg/dL — AB (ref 65–99)
Potassium: 4.7 mmol/L (ref 3.5–5.2)
Sodium: 141 mmol/L (ref 134–144)

## 2018-01-12 ENCOUNTER — Telehealth (HOSPITAL_COMMUNITY): Payer: Self-pay

## 2018-01-12 DIAGNOSIS — I2510 Atherosclerotic heart disease of native coronary artery without angina pectoris: Secondary | ICD-10-CM | POA: Diagnosis not present

## 2018-01-12 DIAGNOSIS — R972 Elevated prostate specific antigen [PSA]: Secondary | ICD-10-CM | POA: Diagnosis not present

## 2018-01-12 DIAGNOSIS — I10 Essential (primary) hypertension: Secondary | ICD-10-CM | POA: Diagnosis not present

## 2018-01-12 DIAGNOSIS — E039 Hypothyroidism, unspecified: Secondary | ICD-10-CM | POA: Diagnosis not present

## 2018-01-12 NOTE — Telephone Encounter (Signed)
Patient will be insured through [place]

## 2018-01-12 NOTE — Telephone Encounter (Signed)
Called to speak with patient in regards to Pulmonary Rehab - Patient is interested in the program. Patient wants to participate in the 1:30pm exc class. Class is full right now and patient understands that we will give him a call once a spot becomes available.

## 2018-01-14 ENCOUNTER — Telehealth (HOSPITAL_COMMUNITY): Payer: Self-pay

## 2018-01-14 NOTE — Telephone Encounter (Signed)
Patient returned phone call and scheduled orientation on 02/11/2018 at 1:30pm. Patient will attend the 1;30pm exc class.

## 2018-01-20 ENCOUNTER — Ambulatory Visit: Payer: PPO | Admitting: Physician Assistant

## 2018-01-20 ENCOUNTER — Encounter: Payer: Self-pay | Admitting: Physician Assistant

## 2018-01-20 ENCOUNTER — Telehealth: Payer: Self-pay

## 2018-01-20 VITALS — BP 86/50 | HR 64

## 2018-01-20 DIAGNOSIS — R1030 Lower abdominal pain, unspecified: Secondary | ICD-10-CM | POA: Diagnosis not present

## 2018-01-20 DIAGNOSIS — R109 Unspecified abdominal pain: Secondary | ICD-10-CM

## 2018-01-20 DIAGNOSIS — Z9981 Dependence on supplemental oxygen: Secondary | ICD-10-CM

## 2018-01-20 DIAGNOSIS — Z7901 Long term (current) use of anticoagulants: Secondary | ICD-10-CM

## 2018-01-20 DIAGNOSIS — K219 Gastro-esophageal reflux disease without esophagitis: Secondary | ICD-10-CM

## 2018-01-20 MED ORDER — RANITIDINE HCL 150 MG PO TABS: 150.0000 mg | ORAL_TABLET | Freq: Every day | ORAL | 3 refills | Status: DC

## 2018-01-20 MED ORDER — ESOMEPRAZOLE MAGNESIUM 40 MG PO CPDR: 40.0000 mg | DELAYED_RELEASE_CAPSULE | Freq: Every day | ORAL | 3 refills | Status: DC

## 2018-01-20 NOTE — Progress Notes (Addendum)
Chief Complaint: GERD, left lower quadrant abdominal pain  HPI:    Corey Tyler is a 77 year old Caucasian male, known to Dr. Hilarie Fredrickson, with a past medical history as listed below including chronic systolic heart failure (LVEF 45-50% 09/16/17), cardiac defibrillator placement, A. fib on Eliquis, COPD on O2 via nasal cannula and multiple others listed below, who was referred to me by [name], MD for a complaint of GERD and left lower quadrant abdominal pain.      Last colonoscopy 09/25/13 with normal mucosa and terminal ileum, moderate diverticulosis in the ascending, transverse, descending and sigmoid colon, evidence of prior surgical anastomosis in the sigmoid colon and otherwise normal appearing mucosa.    Today, presents with wife who assists with history, for the past month has had an increase in reflux and regurgitation, feeling a "burning in his stomach and throat", typically when he lays back in his recliner to go to sleep.  Changed his medication from Pantoprazole 40 mg to over-the-counter Nexium 20 mg which he takes around 4 PM every day and this has helped with his symptoms some over the past week or so.  Does admit to eating late at night, sometimes up until 8 or 9:00 and sometimes this is chips and salsa.    Also describes a pulling sensation in his left upper quadrant which pulls down to his left lower quadrant, this has been occurring for a couple of months and is almost constant, irregardless of position.  Rates as a 1/10 as far as pain goes, more of a sensation/discomfort.  Reminds me of his partial colectomy at one point due to bowel blockage.    Wife concerned regarding hypotension today, apparently patient has been running a very low blood pressure and has feels felt lightheaded recently.    Denies fever, chills, weight loss, anorexia, nausea, vomiting or change in bowel habits.  Past Medical History:  Diagnosis Date  . A-fib ([place])    a. Noted on 12/2012 interrogation, placed on  Apixaban and ultimately stopped NOACs 2/2 personal decision 8/14.  Marland Kitchen AICD (automatic cardioverter/defibrillator) present    Cardiac defibrillator -dual  [facility]  . Arthritis   . Atrial tachycardia-non sustained    a. Noted on 03/2012 interrogation.  . Chronic systolic heart failure ([facility])    EF down to 20 to 25% per echo November 2012  . COPD (chronic obstructive pulmonary disease) ([place])   . Hyperlipidemia   . Hypertension   . ICD (implantable cardiac defibrillator) in place   . NICM (nonischemic cardiomyopathy) ([place])    a. Normal cors 2000. b. Minimal plaque 2013.  . Old myocardial infarction    a. ?Silent MI. b. Large fixed inferolateral defect c/w with prior infarct, no ischemia. c. Cath in 2000/2013 with only minimal CAD.  Marland Kitchen Presence of permanent cardiac pacemaker   . Pulmonary nodule, right    Last scan in 2010 showing stability; felt to be benign.  Marland Kitchen PVC's (premature ventricular contractions)   . Ventricular tachycardia, polymorphic ([place])    Rx via ICD 12/14    Past Surgical History:  Procedure Laterality Date  . APPENDECTOMY    . CARDIAC CATHETERIZATION  2000  . CARDIAC DEFIBRILLATOR PLACEMENT    . CARDIOVERSION  03/30/2013   Procedure: CARDIOVERSION;  Surgeon: Thayer Headings, MD;  Location: [facility];  Service: Cardiovascular;;  . COLON SURGERY    . COLONOSCOPY N/A 09/25/2013   Procedure: COLONOSCOPY;  Surgeon: Jerene Bears, MD;  Location: [facility];  Service:  Endoscopy;  Laterality: N/A;  . ICD  12/2011   [name]  . IMPLANTABLE CARDIOVERTER DEFIBRILLATOR IMPLANT N/A 01/06/2012   Procedure: IMPLANTABLE CARDIOVERTER DEFIBRILLATOR IMPLANT;  Surgeon: Deboraha Sprang, MD;  Location: [facility] CATH LAB;  Service: Cardiovascular;  Laterality: N/A;  . TEE WITHOUT CARDIOVERSION N/A 03/30/2013   Procedure: TRANSESOPHAGEAL ECHOCARDIOGRAM (TEE);  Surgeon: Thayer Headings, MD;  Location: [facility];  Service: Cardiovascular;  Laterality: N/A;  . V TACH ABLATION  06/10/2017  . V TACH  ABLATION N/A 06/10/2017   Procedure: V Tach Ablation;  Surgeon: Evans Lance, MD;  Location: [facility] CV LAB;  Service: Cardiovascular;  Laterality: N/A;    Current Outpatient Medications  Medication Sig Dispense Refill  . Albuterol Sulfate (PROAIR RESPICLICK) 250 (90 Base) MCG/ACT AEPB Inhale 2 puffs into the lungs every 6 (six) hours as needed (shortness of breath/ wheezing).    Marland Kitchen amiodarone (PACERONE) 200 MG tablet Take 2 tablets (400 mg total) by mouth daily. Take 400mg  by mouth daily for 1 month, then resume 200mg  by mouth daily dose. 60 tablet 0  . azelastine (ASTELIN) 0.1 % nasal spray Place 1 spray into both nostrils 2 (two) times daily.     . Carboxymethylcellul-Glycerin (LUBRICATING EYE DROPS OP) Apply 1 drop to eye daily as needed (dry eyes).    . cholecalciferol (VITAMIN D) 1000 UNITS tablet Take 1,000 Units by mouth daily.    Marland Kitchen ELIQUIS 5 MG TABS tablet TAKE 1 TABLET BY MOUTH TWICE A DAY 60 tablet 5  . finasteride (PROSCAR) 5 MG tablet Take 5 mg by mouth daily.     . fluticasone (FLONASE) 50 MCG/ACT nasal spray Place 1 spray into both nostrils daily.     . Fluticasone-Salmeterol (ADVAIR DISKUS) 250-50 MCG/DOSE AEPB Inhale 1 puff into the lungs 2 (two) times daily.    Marland Kitchen levothyroxine (SYNTHROID, LEVOTHROID) 125 MCG tablet Take 125 mcg by mouth daily.    . Melatonin 10 MG TABS Take 10 mg by mouth at bedtime.    . metoprolol tartrate (LOPRESSOR) 25 MG tablet Take 1 tablet (25 mg total) by mouth 2 (two) times daily. 60 tablet 0  . Multiple Vitamin (MULTIVITAMIN WITH MINERALS) TABS tablet Take 1 tablet by mouth daily.    . nitroGLYCERIN (NITROSTAT) 0.4 MG SL tablet Place 0.4 mg under the tongue every 5 (five) minutes as needed for chest pain.     . OXYGEN Inhale 3 L into the lungs See admin instructions. Use oxygen whenever resting    . POTASSIUM PO Take 1 tablet by mouth at bedtime.    . promethazine (PHENERGAN) 25 MG tablet Take 25 mg by mouth daily as needed for nausea or  vomiting.     . Psyllium (METAMUCIL PO) Take 1 Dose by mouth daily. Mix in liquid and drink     . ranolazine (RANEXA) 1000 MG SR tablet Take 1 tablet (1,000 mg total) by mouth 2 (two) times daily. 60 tablet 6  . sacubitril-valsartan (ENTRESTO) 24-26 MG Take 1 tablet by mouth 2 (two) times daily. 60 tablet 11  . Tamsulosin HCl (FLOMAX) 0.4 MG CAPS Take 0.4 mg by mouth at bedtime.     . torsemide (DEMADEX) 20 MG tablet Take 1 tablet (20 mg total) by mouth daily. 30 tablet 3  . vitamin B-12 (CYANOCOBALAMIN) 1000 MCG tablet Take 1,000 mcg by mouth daily.     No current facility-administered medications for this visit.     Allergies as of 01/20/2018 - Review Complete 01/20/2018  Allergen Reaction Noted  . Xarelto [rivaroxaban] Other (See Comments) 12/22/2013  . Adhesive [tape] Hives, Itching, and Rash 12/22/2013  . Atorvastatin Cough 09/16/2010  . Codeine    . Isosorbide nitrate Other (See Comments)   . Latex Itching and Other (See Comments)   . Levofloxacin Other (See Comments)   . Lisinopril Cough   . Lorazepam Other (See Comments)   . Mexiletine  12/15/2017  . Sertraline Other (See Comments)   . Tiotropium bromide monohydrate Swelling     Family History  Problem Relation Age of Onset  . Emphysema Mother   . Heart disease Mother        AVR  . Heart failure Father        Died agwe 83    Social History   Socioeconomic History  . Marital status: Married    Spouse name: Not on file  . Number of children: 2  . Years of education: Not on file  . Highest education level: Not on file  Social Needs  . Financial resource strain: Not on file  . Food insecurity - worry: Not on file  . Food insecurity - inability: Not on file  . Transportation needs - medical: Not on file  . Transportation needs - non-medical: Not on file  Occupational History  . Occupation: Retired    Comment: Press photographer  . Occupation: Works    Fish farm manager: [employer]  Tobacco Use  . Smoking status: Former  Smoker    Packs/day: 2.50    Years: 40.00    Pack years: 100.00    Types: Cigarettes    Last attempt to quit: 11/24/1983    Years since quitting: 34.1  . Smokeless tobacco: Never Used  . Tobacco comment: 2 1/2 ppd x 40 years  Substance and Sexual Activity  . Alcohol use: No  . Drug use: No  . Sexual activity: Yes    Birth control/protection: None  Other Topics Concern  . Not on file  Social History Narrative   Still works at [employer] part-time   Lives with Wife in [place]:    Constitutional: No weight loss, fever or chills Skin: No rash  Cardiovascular: No chest pain   Respiratory: + chronic SOB Gastrointestinal: See HPI and otherwise negative   Physical Exam:  Vital signs: BP (!) 86/50   Pulse 64    Constitutional:   Chronically ill-appearing Pleasant Elderly Caucasian male appears to be in NAD, Well developed, Well nourished, alert and cooperative Head:  Normocephalic and atraumatic. Eyes:   PEERL, EOMI. No icterus. Conjunctiva pink. Ears:  Normal auditory acuity. Neck:  Supple Throat: Oral cavity and pharynx without inflammation, swelling or lesion.  Respiratory: Respirations even and unlabored. Lungs clear to auscultation bilaterally.   No wheezes, crackles, or rhonchi.  On O2 via nasal cannula Cardiovascular: Normal S1, S2. No MRG. Regular rate and rhythm. No peripheral edema, cyanosis or pallor.  Gastrointestinal:  Soft, nondistended, nontender. No rebound or guarding. Normal bowel sounds. No appreciable masses or hepatomegaly. Rectal:  Not performed.  Msk:  Symmetrical without gross deformities. Without edema, no deformity or joint abnormality.  Ambulating in a wheelchair Neurologic:  Alert and  oriented x4;  grossly normal neurologically.  Skin:   Dry and intact without significant lesions or rashes. Psychiatric: Demonstrates good judgement and reason without abnormal affect or behaviors.  RELEVANT LABS AND IMAGING: No recent  CBC  CMP     Component Value Date/Time   NA  141 01/06/2018 1456   K 4.7 01/06/2018 1456   CL 99 01/06/2018 1456   CO2 28 01/06/2018 1456   GLUCOSE 101 (H) 01/06/2018 1456   GLUCOSE 111 (H) 09/19/2017 0549   BUN 21 01/06/2018 1456   CREATININE 1.47 (H) 01/06/2018 1456   CALCIUM 9.0 01/06/2018 1456   PROT 5.3 (L) 09/19/2017 0549   ALBUMIN 2.6 (L) 09/19/2017 0549   AST 15 09/19/2017 0549   ALT 25 09/19/2017 0549   ALKPHOS 64 09/19/2017 0549   BILITOT 0.5 09/19/2017 0549   GFRNONAA 45 (L) 01/06/2018 1456   GFRAA 52 (L) 01/06/2018 1456    Assessment: 1. GERD: Increased over the past few months, some better with a switch from Pantoprazole to Nexium, does continue with nighttime regurgitation, admits to late snacking  2.  Left-sided abdominal pain: Pulling sensation on the left side of his abdomen, worse over the past couple of months; consider relation to previous partial colectomy and adhesions/scar tissue versus mass or other abnormality 3.  Chronic O2 use 4.  Chronic anticoagulation: For A. fib on Eliquis  Plan: 1.  Discussed with the patient that due to his multiple comorbidities he is very high risk for any endoscopic procedures.  I would NOT recommend these at this time and we will try to avoid. 2.  Ordered a CT of the abdomen and pelvis with oral contrast only as patient's creatinine is elevated. 3.  Prescribed Nexium 40 mg once daily, 30-60 minutes before eating dinner.  #90 with 3 refills 4.  Prescribed Zantac 150 mg nightly #90 with 3 refills 5.  Encouraged the patient to change his eating habits.  Encouraged him to stop eating chips and salsa late at night. 6.  Patient to follow in clinic with me in 4-6 weeks. 7.  Did encourage the patient to call his cardiologist regarding hypotension.  They will try to call later this afternoon.  Ellouise Newer, PA-C [facility] 01/20/2018, 10:04 AM  Cc: [name], MD   Addendum: Reviewed and agree with initial  management. Pyrtle, Lajuan Lines, MD

## 2018-01-20 NOTE — Telephone Encounter (Signed)
Spoke with Mr. Mozingo regarding ATP episode from 01/19/18, pt stated that for the past couple of days he hasn't felt well, pt stated that his BP this morning was 77/48 after medications and at the doctors office it was 84/50 at 10am, pt stated that at times he just feels like he is going to fall over.  Pt stated that the [facility] had asked him to reduce his dose of torsesmide from 20mg  to 10mg , which he has done. Asked pt to retake his BP and call back, on call back pts BP was 99/56. Informed pt that I would discuss this with Dr. [name] this afternoon and call back pt voiced understanding.

## 2018-01-20 NOTE — Telephone Encounter (Signed)
LVM on cell and home phone for pt to call [facility] to discuss ATP episodes from 01/16/18@6 :24am

## 2018-01-20 NOTE — Patient Instructions (Addendum)
We have sent the following medications to your pharmacy for you to pick up at your convenience: Nexium 40 mg daily Zantac 150 mg every night  Please stop eating 3-4 hours before bed, especially salsa.  Please follow up with Ellouise Newer, PA-C in 4-6 weeks. We will contact you with an appointment once a schedule has been made available. ______________________________________________________________ Dennis Bast have been scheduled for a CT scan of the abdomen and pelvis at [facility] (1st floor of hospital)   You are scheduled on Wednesday, 01/26/18 at 12:30 pm. You should arrive 15 minutes prior to your appointment time for registration. Please follow the written instructions below on the day of your exam:  1) Do not eat or drink anything after 8:30 am (4 hours prior to your test) 2) You have been given 2 bottles of oral contrast to drink. The solution may taste               better if refrigerated, but do NOT add ice or any other liquid to this solution. Shake             well before drinking.    Drink 1 bottle of contrast @ 10:30 am (2 hours prior to your exam)  Drink 1 bottle of contrast @ 11:30 am (1 hour prior to your exam)  You may take any medications as prescribed with a small amount of water except for the following: Metformin, Glucophage, Glucovance, Avandamet, Riomet, Fortamet, Actoplus Met, Janumet, Glumetza or Metaglip. The above medications must be held the day of the exam AND 48 hours after the exam.  The purpose of you drinking the oral contrast is to aid in the visualization of your intestinal tract. The contrast solution may cause some diarrhea. Before your exam is started, you will be given a small amount of fluid to drink. Depending on your individual set of symptoms, you may also receive an intravenous injection of x-ray contrast/dye. Plan on being at radiology for 30 minutes or longer, depending on the type of exam you are having performed.  This test typically takes  30-45 minutes to complete.  If you have any questions regarding your exam or if you need to reschedule, you may call the CT department at [phone] between the hours of 8:00 am and 5:00 pm, Monday-Friday.  _____________________________________________________________________

## 2018-01-20 NOTE — Telephone Encounter (Signed)
Spoke with Corey Tyler informed him that Dr.Klein recommended stopping his Lopressor and keeping a log of his BPs before taking am medications and 2 hours after medications as well as a BP in the afternoon and call the clinic back in a week with those numbers and how he was doing overall pt voiced understanding.

## 2018-01-24 ENCOUNTER — Telehealth: Payer: Self-pay

## 2018-01-24 NOTE — Telephone Encounter (Signed)
Spoke pt informed him that Dr. [name] would like him to restart his Lopressor 25mg  twice a day due to him have a fair amount of ATP over the weekend. Pt voiced understanding. Asked pt if he was interested in another VT ablation, pt adamantly stated that he would never have one of those again. Informed pt that plan would be to just continue the current treatment plan.

## 2018-01-26 ENCOUNTER — Ambulatory Visit (HOSPITAL_COMMUNITY)
Admission: RE | Admit: 2018-01-26 | Discharge: 2018-01-26 | Disposition: A | Discharge status: Home / Self Care | Payer: PPO | Source: Ambulatory Visit | Attending: Physician Assistant | Admitting: Physician Assistant

## 2018-01-26 ENCOUNTER — Encounter (HOSPITAL_COMMUNITY): Payer: Self-pay

## 2018-01-26 DIAGNOSIS — K5730 Diverticulosis of large intestine without perforation or abscess without bleeding: Secondary | ICD-10-CM | POA: Insufficient documentation

## 2018-01-26 DIAGNOSIS — K219 Gastro-esophageal reflux disease without esophagitis: Secondary | ICD-10-CM | POA: Diagnosis not present

## 2018-01-26 DIAGNOSIS — J984 Other disorders of lung: Secondary | ICD-10-CM | POA: Insufficient documentation

## 2018-01-26 DIAGNOSIS — I517 Cardiomegaly: Secondary | ICD-10-CM | POA: Insufficient documentation

## 2018-01-26 DIAGNOSIS — R109 Unspecified abdominal pain: Secondary | ICD-10-CM | POA: Insufficient documentation

## 2018-01-26 DIAGNOSIS — I714 Abdominal aortic aneurysm, without rupture: Secondary | ICD-10-CM | POA: Diagnosis not present

## 2018-01-28 ENCOUNTER — Ambulatory Visit (HOSPITAL_COMMUNITY): Payer: PPO

## 2018-02-02 ENCOUNTER — Telehealth: Payer: Self-pay | Admitting: *Deleted

## 2018-02-02 NOTE — Telephone Encounter (Signed)
As per Ellouise Newer, PA-C's request at patient's 01/20/18 office visit, I contacted patient to get him scheduled for a follow up visit around 02/21/18(at the time of his Feb. Visit, we did not have a schedule far enough out to make a future appointment). However, patient states that he is doing much better now and does not feel he needs an appointment. He states he will call back if he feels he needs to be seen again.

## 2018-02-11 ENCOUNTER — Encounter (HOSPITAL_COMMUNITY): Payer: Self-pay

## 2018-02-11 ENCOUNTER — Encounter (HOSPITAL_COMMUNITY)
Admission: RE | Admit: 2018-02-11 | Discharge: 2018-02-11 | Disposition: A | Discharge status: Home / Self Care | Payer: PPO | Source: Ambulatory Visit | Attending: Pulmonary Disease | Admitting: Pulmonary Disease

## 2018-02-11 VITALS — BP 111/65 | HR 61 | Resp 18 | Ht 73.0 in | Wt 224.6 lb

## 2018-02-11 DIAGNOSIS — J438 Other emphysema: Secondary | ICD-10-CM | POA: Insufficient documentation

## 2018-02-11 DIAGNOSIS — J439 Emphysema, unspecified: Secondary | ICD-10-CM

## 2018-02-11 NOTE — Progress Notes (Signed)
Corey Tyler 77 y.o. male Pulmonary Rehab Orientation Note Patient arrived today in Cardiac and Pulmonary Rehab for orientation to Pulmonary Rehab. He was transported from [employer] via wheel chair. He does carry portable oxygen however he does not wear it continously. Per pt, he uses oxygen continuously at night and intermittently during the day however it has been prescribed continuously. Color good, skin warm and dry. Patient is oriented to time and place. Patient's medical history, psychosocial health, and medications reviewed. Psychosocial assessment reveals pt lives with their spouse. Pt is currently retired. Pt states he does not have any real hobbies however he enjoys watching TV. Pt reports his stress level is moderate. Areas of stress/anxiety include Health.  Pt does not exhibit signs of depression however he has an extremely difficult time sleeping. He sleeps in the recliner and has gotten into the habit of keeping the TV on. Discuss sleep hygiene. PHQ2/9 score 0/na. Pt shows good  coping skills with positive outlook. He is offered emotional support and reassurance. Will continue to monitor and evaluate progress toward psychosocial goal(s) of remaining positive about his ability to participate in pulmonary rehab without recurrent VT or Afib. Physical assessment reveals heart rate is normal, breath sounds clear to auscultation, no wheezes, rales, or rhonchi. Grip strength equal, strong. Distal pulses faint. 2+ pitting edema noted to lower legs and ankles.Patient reports he does not take medications as prescribed. He was prescribed Demadex recently however decided it did not work as well as lasix and subsequently went back to taking his lasix without letting his provider know. Patient states he follows a Low Sodium diet. The patient has been trying to lose weight through a healthy diet and exercise program. Patient's weight will be monitored closely. Demonstration and practice of PLB using pulse  oximeter. Patient able to return demonstration satisfactorily. Safety and hand hygiene in the exercise area reviewed with patient. Patient voices understanding of the information reviewed. Department expectations discussed with patient and achievable goals were set. The patient shows enthusiasm about attending the program and we look forward to working with this nice gentleman. The patient is scheduled for a 6 min walk test on 02/15/18 and to begin exercise on 02/22/18.   45 minutes was spent on a variety of activities such as assessment of the patient, obtaining baseline data including height, weight, BMI, and grip strength, verifying medical history, allergies, and current medications, and teaching patient strategies for performing tasks with less respiratory effort with emphasis on pursed lip breathing.

## 2018-02-15 ENCOUNTER — Encounter (HOSPITAL_COMMUNITY)
Admission: RE | Admit: 2018-02-15 | Discharge: 2018-02-15 | Disposition: A | Discharge status: Home / Self Care | Payer: PPO | Source: Ambulatory Visit | Attending: Pulmonary Disease | Admitting: Pulmonary Disease

## 2018-02-15 DIAGNOSIS — J438 Other emphysema: Secondary | ICD-10-CM | POA: Diagnosis not present

## 2018-02-15 DIAGNOSIS — J439 Emphysema, unspecified: Secondary | ICD-10-CM

## 2018-02-17 NOTE — Progress Notes (Signed)
Pulmonary Individual Treatment Plan  Patient Details  Name: AMAHRI DENGEL MRN: [id number] Date of Birth: 01-08-1941 Referring Provider:     Pulmonary Rehab Walk Test from 02/15/2018 in [place]  Referring Provider  Dr. Nelda Marseille      Initial Encounter Date:    Pulmonary Rehab Walk Test from 02/15/2018 in [facility]  Date  02/17/18  Referring Provider  Dr. Nelda Marseille      Visit Diagnosis: Pulmonary emphysema, unspecified emphysema type ([place])  Patient's Home Medications on Admission:   Current Outpatient Medications:  .  Albuterol Sulfate (PROAIR RESPICLICK) 761 (90 Base) MCG/ACT AEPB, Inhale 2 puffs into the lungs every 6 (six) hours as needed (shortness of breath/ wheezing)., Disp: , Rfl:  .  amiodarone (PACERONE) 200 MG tablet, Take 2 tablets (400 mg total) by mouth daily. Take 400mg  by mouth daily for 1 month, then resume 200mg  by mouth daily dose., Disp: 60 tablet, Rfl: 0 .  Carboxymethylcellul-Glycerin (LUBRICATING EYE DROPS OP), Apply 1 drop to eye daily as needed (dry eyes)., Disp: , Rfl:  .  cholecalciferol (VITAMIN D) 1000 UNITS tablet, Take 1,000 Units by mouth daily., Disp: , Rfl:  .  ELIQUIS 5 MG TABS tablet, TAKE 1 TABLET BY MOUTH TWICE A DAY, Disp: 60 tablet, Rfl: 5 .  esomeprazole (NEXIUM) 40 MG capsule, Take 1 capsule (40 mg total) by mouth daily., Disp: 90 capsule, Rfl: 3 .  finasteride (PROSCAR) 5 MG tablet, Take 5 mg by mouth daily. , Disp: , Rfl:  .  fluticasone (FLONASE) 50 MCG/ACT nasal spray, Place 1 spray into both nostrils daily. , Disp: , Rfl:  .  Fluticasone-Salmeterol (ADVAIR DISKUS) 250-50 MCG/DOSE AEPB, Inhale 1 puff into the lungs 2 (two) times daily., Disp: , Rfl:  .  levothyroxine (SYNTHROID, LEVOTHROID) 125 MCG tablet, Take 125 mcg by mouth daily., Disp: , Rfl:  .  Melatonin 10 MG TABS, Take 10 mg by mouth at bedtime., Disp: , Rfl:  .  metoprolol tartrate (LOPRESSOR) 25 MG tablet, Take 1 tablet  (25 mg total) by mouth 2 (two) times daily., Disp: 60 tablet, Rfl: 0 .  Multiple Vitamin (MULTIVITAMIN WITH MINERALS) TABS tablet, Take 1 tablet by mouth daily., Disp: , Rfl:  .  nitroGLYCERIN (NITROSTAT) 0.4 MG SL tablet, Place 0.4 mg under the tongue every 5 (five) minutes as needed for chest pain. , Disp: , Rfl:  .  OXYGEN, Inhale 3 L into the lungs See admin instructions. Use oxygen whenever resting, Disp: , Rfl:  .  POTASSIUM PO, Take 1 tablet by mouth at bedtime., Disp: , Rfl:  .  promethazine (PHENERGAN) 25 MG tablet, Take 25 mg by mouth daily as needed for nausea or vomiting. , Disp: , Rfl:  .  Psyllium (METAMUCIL PO), Take 1 Dose by mouth daily. Mix in liquid and drink , Disp: , Rfl:  .  ranitidine (ZANTAC) 150 MG tablet, Take 1 tablet (150 mg total) by mouth at bedtime., Disp: 90 tablet, Rfl: 3 .  ranolazine (RANEXA) 1000 MG SR tablet, Take 1 tablet (1,000 mg total) by mouth 2 (two) times daily., Disp: 60 tablet, Rfl: 6 .  sacubitril-valsartan (ENTRESTO) 24-26 MG, Take 1 tablet by mouth 2 (two) times daily., Disp: 60 tablet, Rfl: 11 .  Tamsulosin HCl (FLOMAX) 0.4 MG CAPS, Take 0.4 mg by mouth at bedtime. , Disp: , Rfl:  .  torsemide (DEMADEX) 20 MG tablet, Take 1 tablet (20 mg total) by mouth daily. (  Patient not taking: Reported on 02/11/2018), Disp: 30 tablet, Rfl: 3 .  vitamin B-12 (CYANOCOBALAMIN) 1000 MCG tablet, Take 1,000 mcg by mouth daily., Disp: , Rfl:   Past Medical History: Past Medical History:  Diagnosis Date  . A-fib ([place])    a. Noted on 12/2012 interrogation, placed on Apixaban and ultimately stopped NOACs 2/2 personal decision 8/14.  Marland Kitchen AICD (automatic cardioverter/defibrillator) present    Cardiac defibrillator -dual  [facility]  . Arthritis   . Atrial tachycardia-non sustained    a. Noted on 03/2012 interrogation.  . Chronic systolic heart failure ([facility])    EF down to 20 to 25% per echo November 2012  . COPD (chronic obstructive pulmonary disease) ([place])   .  Hyperlipidemia   . Hypertension   . ICD (implantable cardiac defibrillator) in place   . NICM (nonischemic cardiomyopathy) ([place])    a. Normal cors 2000. b. Minimal plaque 2013.  . Old myocardial infarction    a. ?Silent MI. b. Large fixed inferolateral defect c/w with prior infarct, no ischemia. c. Cath in 2000/2013 with only minimal CAD.  Marland Kitchen Presence of permanent cardiac pacemaker   . Pulmonary nodule, right    Last scan in 2010 showing stability; felt to be benign.  Marland Kitchen PVC's (premature ventricular contractions)   . Ventricular tachycardia, polymorphic ([place])    Rx via ICD 12/14    Tobacco Use: Social History   Tobacco Use  Smoking Status Former Smoker  . Packs/day: 2.50  . Years: 40.00  . Pack years: 100.00  . Types: Cigarettes  . Last attempt to quit: 11/24/1983  . Years since quitting: 34.2  Smokeless Tobacco Never Used  Tobacco Comment   2 1/2 ppd x 40 years    Labs: Recent Review Flowsheet Data    Labs for ITP Cardiac and Pulmonary Rehab Latest Ref Rng & Units 08/10/2008 12/18/2011 12/18/2011 03/27/2013 04/17/2014   Cholestrol 0 - 200 mg/dL - - - - 113   LDLCALC 0 - 99 mg/dL - - - - 52   HDL >39.00 mg/dL - - - - 49.80   Trlycerides 0.0 - 149.0 mg/dL - - - - 58.0   Hemoglobin A1c <5.7 % - - - 6.0(H) -   PHART 7.350 - 7.450 - - 7.395 - -   PCO2ART 35.0 - 45.0 mmHg - - 38.1 - -   HCO3 20.0 - 24.0 mEq/L - 23.8 23.4 - -   TCO2 0 - 100 mmol/L 25 25 25  - -   ACIDBASEDEF 0.0 - 2.0 mmol/L - 2.0 1.0 - -   O2SAT % - 57.0 93.0 - -      Capillary Blood Glucose: Lab Results  Component Value Date   GLUCAP 109 (H) 07/18/2015     Pulmonary Assessment Scores: Pulmonary Assessment Scores    Row Name 02/17/18 1603         mMRC Score   mMRC Score  3        Pulmonary Function Assessment: Pulmonary Function Assessment - 02/11/18 1431      Breath   Bilateral Breath Sounds  Clear;Decreased    Shortness of Breath  Limiting activity;Yes       Exercise Target Goals: Date:  02/17/18  Exercise Program Goal: Individual exercise prescription set using results from initial 6 min walk test and THRR while considering  patient's activity barriers and safety.    Exercise Prescription Goal: Initial exercise prescription builds to 30-45 minutes a day of aerobic activity, 2-3 days per week.  Home exercise guidelines will be given to patient during program as part of exercise prescription that the participant will acknowledge.  Activity Barriers & Risk Stratification: Activity Barriers & Cardiac Risk Stratification - 02/11/18 1408      Activity Barriers & Cardiac Risk Stratification   Activity Barriers  Shortness of Breath;Muscular Weakness;Deconditioning;Balance Concerns;History of Falls;Decreased Ventricular Function       6 Minute Walk: 6 Minute Walk    Row Name 02/17/18 1557         6 Minute Walk   Phase  Initial     Distance  1200 feet     Walk Time  6 minutes     # of Rest Breaks  0     MPH  2.27     METS  2.68     RPE  12     Perceived Dyspnea   1     Symptoms  No     Resting HR  66 bpm     Resting BP  108/67     Resting Oxygen Saturation   93 %     Exercise Oxygen Saturation  during 6 min walk  86 %     Max Ex. HR  87 bpm     Max Ex. BP  130/64       Interval HR   1 Minute HR  76     2 Minute HR  70     3 Minute HR  71     4 Minute HR  80     5 Minute HR  87     6 Minute HR  87     2 Minute Post HR  67     Interval Heart Rate?  Yes       Interval Oxygen   Interval Oxygen?  Yes     Baseline Oxygen Saturation %  93 %     1 Minute Oxygen Saturation %  88 %     1 Minute Liters of Oxygen  3 L     2 Minute Oxygen Saturation %  86 %     2 Minute Liters of Oxygen  4 L     3 Minute Oxygen Saturation %  86 %     3 Minute Liters of Oxygen  4 L     4 Minute Oxygen Saturation %  91 %     4 Minute Liters of Oxygen  4 L     5 Minute Oxygen Saturation %  87 %     5 Minute Liters of Oxygen  6 L     6 Minute Oxygen Saturation %  88 %     6  Minute Liters of Oxygen  6 L     2 Minute Post Oxygen Saturation %  95 %     2 Minute Post Liters of Oxygen  6 L        Oxygen Initial Assessment: Oxygen Initial Assessment - 02/17/18 1603      Initial 6 min Walk   Oxygen Used  Continuous;E-Tanks    Liters per minute  6      Program Oxygen Prescription   Program Oxygen Prescription  Continuous;E-Tanks    Liters per minute  6       Oxygen Re-Evaluation:   Oxygen Discharge (Final Oxygen Re-Evaluation):   Initial Exercise Prescription: Initial Exercise Prescription - 02/17/18 1600      Date of Initial Exercise RX and Referring Provider   Date  02/17/18    Referring Provider  Dr. Nelda Marseille      Oxygen   Oxygen  Continuous    Liters  6      NuStep   Level  2    SPM  80    Minutes  17    METs  1.5      Arm Ergometer   Level  2    Watts  10    Minutes  17      Track   Laps  5    Minutes  17    METs  1      Prescription Details   Frequency (times per week)  2    Duration  Progress to 45 minutes of aerobic exercise without signs/symptoms of physical distress      Intensity   THRR 40-80% of Max Heartrate  57-114    Ratings of Perceived Exertion  11-13    Perceived Dyspnea  0-4      Progression   Progression  Continue progressive overload as per policy without signs/symptoms or physical distress.      Resistance Training   Training Prescription  Yes    Weight  blue bands    Reps  10-15       Perform Capillary Blood Glucose checks as needed.  Exercise Prescription Changes:   Exercise Comments:   Exercise Goals and Review: Exercise Goals    Row Name 02/11/18 1411             Exercise Goals   Increase Physical Activity  Yes       Intervention  Provide advice, education, support and counseling about physical activity/exercise needs.;Develop an individualized exercise prescription for aerobic and resistive training based on initial evaluation findings, risk stratification, comorbidities and  participant's personal goals.       Expected Outcomes  Long Term: Add in home exercise to make exercise part of routine and to increase amount of physical activity.;Long Term: Exercising regularly at least 3-5 days a week.       Increase Strength and Stamina  Yes       Intervention  Develop an individualized exercise prescription for aerobic and resistive training based on initial evaluation findings, risk stratification, comorbidities and participant's personal goals.;Provide advice, education, support and counseling about physical activity/exercise needs.       Expected Outcomes  Short Term: Perform resistance training exercises routinely during rehab and add in resistance training at home;Long Term: Improve cardiorespiratory fitness, muscular endurance and strength as measured by increased METs and functional capacity (6MWT);Short Term: Increase workloads from initial exercise prescription for resistance, speed, and METs.       Able to understand and use rate of perceived exertion (RPE) scale  Yes       Intervention  Provide education and explanation on how to use RPE scale       Expected Outcomes  Short Term: Able to use RPE daily in rehab to express subjective intensity level;Long Term:  Able to use RPE to guide intensity level when exercising independently       Able to understand and use Dyspnea scale  Yes       Intervention  Provide education and explanation on how to use Dyspnea scale       Expected Outcomes  Short Term: Able to use Dyspnea scale daily in rehab to express subjective sense of shortness of breath during exertion;Long Term: Able to use Dyspnea scale to guide intensity level when exercising independently  Knowledge and understanding of Target Heart Rate Range (THRR)  Yes       Intervention  Provide education and explanation of THRR including how the numbers were predicted and where they are located for reference       Expected Outcomes  Short Term: Able to use daily as  guideline for intensity in rehab;Short Term: Able to state/look up THRR;Long Term: Able to use THRR to govern intensity when exercising independently       Understanding of Exercise Prescription  Yes       Intervention  Provide education, explanation, and written materials on patient's individual exercise prescription       Expected Outcomes  Short Term: Able to explain program exercise prescription;Long Term: Able to explain home exercise prescription to exercise independently          Exercise Goals Re-Evaluation :   Discharge Exercise Prescription (Final Exercise Prescription Changes):   Nutrition:  Target Goals: Understanding of nutrition guidelines, daily intake of sodium 1500mg , cholesterol 200mg , calories 30% from fat and 7% or less from saturated fats, daily to have 5 or more servings of fruits and vegetables.  Biometrics: Pre Biometrics - 02/11/18 1457      Pre Biometrics   Grip Strength  35 kg        Nutrition Therapy Plan and Nutrition Goals:   Nutrition Assessments:   Nutrition Goals Re-Evaluation:   Nutrition Goals Discharge (Final Nutrition Goals Re-Evaluation):   Psychosocial: Target Goals: Acknowledge presence or absence of significant depression and/or stress, maximize coping skills, provide positive support system. Participant is able to verbalize types and ability to use techniques and skills needed for reducing stress and depression.  Initial Review & Psychosocial Screening: Initial Psych Review & Screening - 02/11/18 1433      Initial Review   Current issues with  Current Sleep Concerns      Family Dynamics   Good Support System?  Yes    Comments  children, wife, and siblings      Barriers   Psychosocial barriers to participate in program  There are no identifiable barriers or psychosocial needs.      Screening Interventions   Interventions  Encouraged to exercise       Quality of Life Scores:  Scores of 19 and below usually indicate  a poorer quality of life in these areas.  A difference of  2-3 points is a clinically meaningful difference.  A difference of 2-3 points in the total score of the Quality of Life Index has been associated with significant improvement in overall quality of life, self-image, physical symptoms, and general health in studies assessing change in quality of life.   PHQ-9: Recent Review Flowsheet Data    Depression screen [facility] 2/9 02/11/2018 03/09/2016   Decreased Interest 0 0   Down, Depressed, Hopeless 0 0   PHQ - 2 Score 0 0     Interpretation of Total Score  Total Score Depression Severity:  1-4 = Minimal depression, 5-9 = Mild depression, 10-14 = Moderate depression, 15-19 = Moderately severe depression, 20-27 = Severe depression   Psychosocial Evaluation and Intervention: Psychosocial Evaluation - 02/11/18 1447      Psychosocial Evaluation & Interventions   Interventions  Encouraged to exercise with the program and follow exercise prescription    Expected Outcomes  patient will remain free from psychosocial barriers to participation    Continue Psychosocial Services   No Follow up required       Psychosocial Re-Evaluation:  Psychosocial Discharge (Final Psychosocial Re-Evaluation):   Education: Education Goals: Education classes will be provided on a weekly basis, covering required topics. Participant will state understanding/return demonstration of topics presented.  Learning Barriers/Preferences: Learning Barriers/Preferences - 02/11/18 1431      Learning Barriers/Preferences   Learning Barriers  None    Learning Preferences  Group Instruction;Individual Instruction;Skilled Demonstration;Verbal Instruction       Education Topics: Risk Factor Reduction:  -Group instruction that is supported by a PowerPoint presentation. Instructor discusses the definition of a risk factor, different risk factors for pulmonary disease, and how the heart and lungs work together.      Nutrition for Pulmonary Patient:  -Group instruction provided by PowerPoint slides, verbal discussion, and written materials to support subject matter. The instructor gives an explanation and review of healthy diet recommendations, which includes a discussion on weight management, recommendations for fruit and vegetable consumption, as well as protein, fluid, caffeine, fiber, sodium, sugar, and alcohol. Tips for eating when patients are short of breath are discussed.   Pursed Lip Breathing:  -Group instruction that is supported by demonstration and informational handouts. Instructor discusses the benefits of pursed lip and diaphragmatic breathing and detailed demonstration on how to preform both.     Oxygen Safety:  -Group instruction provided by PowerPoint, verbal discussion, and written material to support subject matter. There is an overview of "What is Oxygen" and "Why do we need it".  Instructor also reviews how to create a safe environment for oxygen use, the importance of using oxygen as prescribed, and the risks of noncompliance. There is a brief discussion on traveling with oxygen and resources the patient may utilize.   Oxygen Equipment:  -Group instruction provided by [facility] Staff utilizing handouts, written materials, and equipment demonstrations.   Signs and Symptoms:  -Group instruction provided by written material and verbal discussion to support subject matter. Warning signs and symptoms of infection, stroke, and heart attack are reviewed and when to call the physician/911 reinforced. Tips for preventing the spread of infection discussed.   Advanced Directives:  -Group instruction provided by verbal instruction and written material to support subject matter. Instructor reviews Advanced Directive laws and proper instruction for filling out document.   Pulmonary Video:  -Group video education that reviews the importance of medication and oxygen compliance, exercise,  good nutrition, pulmonary hygiene, and pursed lip and diaphragmatic breathing for the pulmonary patient.   Exercise for the Pulmonary Patient:  -Group instruction that is supported by a PowerPoint presentation. Instructor discusses benefits of exercise, core components of exercise, frequency, duration, and intensity of an exercise routine, importance of utilizing pulse oximetry during exercise, safety while exercising, and options of places to exercise outside of rehab.     Pulmonary Medications:  -Verbally interactive group education provided by instructor with focus on inhaled medications and proper administration.   Anatomy and Physiology of the Respiratory System and Intimacy:  -Group instruction provided by PowerPoint, verbal discussion, and written material to support subject matter. Instructor reviews respiratory cycle and anatomical components of the respiratory system and their functions. Instructor also reviews differences in obstructive and restrictive respiratory diseases with examples of each. Intimacy, Sex, and Sexuality differences are reviewed with a discussion on how relationships can change when diagnosed with pulmonary disease. Common sexual concerns are reviewed.   MD DAY -A group question and answer session with a medical doctor that allows participants to ask questions that relate to their pulmonary disease state.   OTHER EDUCATION -Group or  individual verbal, written, or video instructions that support the educational goals of the pulmonary rehab program.   Holiday Eating Survival Tips:  -Group instruction provided by PowerPoint slides, verbal discussion, and written materials to support subject matter. The instructor gives patients tips, tricks, and techniques to help them not only survive but enjoy the holidays despite the onslaught of food that accompanies the holidays.   Knowledge Questionnaire Score:   Core Components/Risk Factors/Patient Goals at  Admission: Personal Goals and Risk Factors at Admission - 02/11/18 1432      Core Components/Risk Factors/Patient Goals on Admission   Improve shortness of breath with ADL's  Yes    Intervention  Provide education, individualized exercise plan and daily activity instruction to help decrease symptoms of SOB with activities of daily living.    Expected Outcomes  Short Term: Improve cardiorespiratory fitness to achieve a reduction of symptoms when performing ADLs;Long Term: Be able to perform more ADLs without symptoms or delay the onset of symptoms    Heart Failure  Yes    Intervention  Provide a combined exercise and nutrition program that is supplemented with education, support and counseling about heart failure. Directed toward relieving symptoms such as shortness of breath, decreased exercise tolerance, and extremity edema.    Expected Outcomes  Improve functional capacity of life;Short term: Attendance in program 2-3 days a week with increased exercise capacity. Reported lower sodium intake. Reported increased fruit and vegetable intake. Reports medication compliance.;Short term: Daily weights obtained and reported for increase. Utilizing diuretic protocols set by physician.;Long term: Adoption of self-care skills and reduction of barriers for early signs and symptoms recognition and intervention leading to self-care maintenance.       Core Components/Risk Factors/Patient Goals Review:    Core Components/Risk Factors/Patient Goals at Discharge (Final Review):    ITP Comments:   Comments:

## 2018-02-22 ENCOUNTER — Encounter (HOSPITAL_COMMUNITY)
Admission: RE | Admit: 2018-02-22 | Discharge: 2018-02-22 | Disposition: A | Discharge status: Home / Self Care | Payer: PPO | Source: Ambulatory Visit | Attending: Pulmonary Disease | Admitting: Pulmonary Disease

## 2018-02-22 ENCOUNTER — Ambulatory Visit (INDEPENDENT_AMBULATORY_CARE_PROVIDER_SITE_OTHER): Payer: PPO | Admitting: *Deleted

## 2018-02-22 VITALS — Wt 224.0 lb

## 2018-02-22 DIAGNOSIS — J439 Emphysema, unspecified: Secondary | ICD-10-CM

## 2018-02-22 DIAGNOSIS — J438 Other emphysema: Secondary | ICD-10-CM | POA: Diagnosis not present

## 2018-02-22 DIAGNOSIS — I255 Ischemic cardiomyopathy: Secondary | ICD-10-CM

## 2018-02-22 NOTE — Progress Notes (Signed)
Remote ICD transmission.   

## 2018-02-22 NOTE — Progress Notes (Addendum)
Daily Session Note  Patient Details  Name: Corey Tyler MRN: [id number] Date of Birth: 03-13-1941 Referring Provider:     Pulmonary Rehab Walk Test from 02/15/2018 in [place]  Referring Provider  Dr. Nelda Marseille      Encounter Date: 02/22/2018  Check In: Session Check In - 02/22/18 1529      Check-In   Location  [facility]    Staff Present  Trish Fountain, RN, BSN;Molly diVincenzo, MS, ACSM RCEP, Exercise Physiologist;Lisa Ysidro Evert, RN    Supervising physician immediately available to respond to emergencies  [facility] immediately available    Physician(s)  Dr. Posey Pronto    Medication changes reported      No    Fall or balance concerns reported     Yes    Tobacco Cessation  No Change    Warm-up and Cool-down  Performed as group-led instruction    Resistance Training Performed  Yes    VAD Patient?  No      Pain Assessment   Currently in Pain?  No/denies    Multiple Pain Sites  No       Capillary Blood Glucose: No results found for this or any previous visit (from the past 24 hour(s)).  Exercise Prescription Changes - 02/22/18 1535      Response to Exercise   Blood Pressure (Admit)  90/42    Blood Pressure (Exercise)  98/52    Blood Pressure (Exit)  112/60    Heart Rate (Admit)  64 bpm    Heart Rate (Exercise)  89 bpm    Heart Rate (Exit)  63 bpm    Oxygen Saturation (Admit)  93 %    Oxygen Saturation (Exercise)  89 %    Oxygen Saturation (Exit)  95 %    Rating of Perceived Exertion (Exercise)  13    Perceived Dyspnea (Exercise)  2    Duration  Progress to 45 minutes of aerobic exercise without signs/symptoms of physical distress 40-80% HRR    Intensity  THRR unchanged      Progression   Progression  Continue to progress workloads to maintain intensity without signs/symptoms of physical distress.      Resistance Training   Training Prescription  Yes    Weight  blue bands    Reps  10-15      Oxygen   Oxygen   Continuous    Liters  6      NuStep   Level  2    SPM  80    Minutes  17    METs  2      Arm Ergometer   Level  2    Watts  10    Minutes  17      Track   Laps  13    Minutes  17    METs  1       Social History   Tobacco Use  Smoking Status Former Smoker  . Packs/day: 2.50  . Years: 40.00  . Pack years: 100.00  . Types: Cigarettes  . Last attempt to quit: 11/24/1983  . Years since quitting: 34.2  Smokeless Tobacco Never Used  Tobacco Comment   2 1/2 ppd x 40 years    Goals Met:  Exercise tolerated well Queuing for purse lip breathing No report of cardiac concerns or symptoms Strength training completed today  Goals Unmet:  Not Applicable  Comments: Service time is from 1330 to 1515  Dr. Rush Farmer is Medical Director for Pulmonary Rehab at [facility].

## 2018-02-23 ENCOUNTER — Encounter: Payer: Self-pay | Admitting: Cardiology

## 2018-02-23 ENCOUNTER — Encounter (HOSPITAL_COMMUNITY): Payer: Self-pay | Admitting: *Deleted

## 2018-02-24 ENCOUNTER — Other Ambulatory Visit: Payer: Self-pay | Admitting: *Deleted

## 2018-02-24 ENCOUNTER — Encounter (HOSPITAL_COMMUNITY)
Admission: RE | Admit: 2018-02-24 | Discharge: 2018-02-24 | Disposition: A | Discharge status: Home / Self Care | Payer: PPO | Source: Ambulatory Visit | Attending: Internal Medicine | Admitting: Internal Medicine

## 2018-02-24 DIAGNOSIS — J438 Other emphysema: Secondary | ICD-10-CM | POA: Diagnosis not present

## 2018-02-24 DIAGNOSIS — J439 Emphysema, unspecified: Secondary | ICD-10-CM

## 2018-02-24 MED ORDER — RANOLAZINE ER 1000 MG PO TB12: 1000.0000 mg | ORAL_TABLET | Freq: Two times a day (BID) | ORAL | 3 refills | Status: DC

## 2018-02-24 NOTE — Telephone Encounter (Signed)
Pt called requesting a refill on ranolazine (Ranexa) sent to his [facility] in [place]. Pt's Rx was faxed to [phone], ATTN: [facility]. Confirmation received.

## 2018-02-24 NOTE — Progress Notes (Addendum)
Daily Session Note  Patient Details  Name: Corey Tyler MRN: [id number] Date of Birth: 20-Feb-1941 Referring Provider:     Pulmonary Rehab Walk Test from 02/15/2018 in [place]  Referring Provider  Dr. Nelda Marseille      Encounter Date: 02/24/2018  Check In: Session Check In - 02/24/18 1330      Check-In   Location  [facility]    Staff Present  Trish Fountain, RN, BSN;Molly diVincenzo, MS, ACSM RCEP, Exercise Physiologist;Lisa Ysidro Evert, RN;Other    Supervising physician immediately available to respond to emergencies  [facility] immediately available    Physician(s)  Dr. Lonny Prude    Medication changes reported      No    Fall or balance concerns reported     Yes    Tobacco Cessation  No Change    Warm-up and Cool-down  Performed as group-led instruction    Resistance Training Performed  Yes    VAD Patient?  No      Pain Assessment   Currently in Pain?  No/denies    Multiple Pain Sites  No       Capillary Blood Glucose: No results found for this or any previous visit (from the past 24 hour(s)).    Social History   Tobacco Use  Smoking Status Former Smoker  . Packs/day: 2.50  . Years: 40.00  . Pack years: 100.00  . Types: Cigarettes  . Last attempt to quit: 11/24/1983  . Years since quitting: 34.2  Smokeless Tobacco Never Used  Tobacco Comment   2 1/2 ppd x 40 years    Goals Met:  Exercise tolerated well Queuing for purse lip breathing No report of cardiac concerns or symptoms Strength training completed today  Goals Unmet:  Not Applicable  Comments: Service time is from 1330 to 1510   Dr. Rush Farmer is Medical Director for Pulmonary Rehab at [facility].

## 2018-02-28 NOTE — Progress Notes (Signed)
Pulmonary Individual Treatment Plan  Patient Details  Name: Corey Tyler MRN: [id number] Date of Birth: 1941/01/03 Referring Provider:     Pulmonary Rehab Walk Test from 02/15/2018 in [facility]  Referring Provider  Dr. Nelda Marseille      Initial Encounter Date:    Pulmonary Rehab Walk Test from 02/15/2018 in [facility]  Date  02/17/18  Referring Provider  Dr. Nelda Marseille      Visit Diagnosis: Pulmonary emphysema, unspecified emphysema type ([place])  Patient's Home Medications on Admission:   Current Outpatient Medications:  .  Albuterol Sulfate (PROAIR RESPICLICK) 409 (90 Base) MCG/ACT AEPB, Inhale 2 puffs into the lungs every 6 (six) hours as needed (shortness of breath/ wheezing)., Disp: , Rfl:  .  amiodarone (PACERONE) 200 MG tablet, Take 2 tablets (400 mg total) by mouth daily. Take 474m by mouth daily for 1 month, then resume 2056mby mouth daily dose., Disp: 60 tablet, Rfl: 0 .  Carboxymethylcellul-Glycerin (LUBRICATING EYE DROPS OP), Apply 1 drop to eye daily as needed (dry eyes)., Disp: , Rfl:  .  cholecalciferol (VITAMIN D) 1000 UNITS tablet, Take 1,000 Units by mouth daily., Disp: , Rfl:  .  ELIQUIS 5 MG TABS tablet, TAKE 1 TABLET BY MOUTH TWICE A DAY, Disp: 60 tablet, Rfl: 5 .  esomeprazole (NEXIUM) 40 MG capsule, Take 1 capsule (40 mg total) by mouth daily., Disp: 90 capsule, Rfl: 3 .  finasteride (PROSCAR) 5 MG tablet, Take 5 mg by mouth daily. , Disp: , Rfl:  .  fluticasone (FLONASE) 50 MCG/ACT nasal spray, Place 1 spray into both nostrils daily. , Disp: , Rfl:  .  Fluticasone-Salmeterol (ADVAIR DISKUS) 250-50 MCG/DOSE AEPB, Inhale 1 puff into the lungs 2 (two) times daily., Disp: , Rfl:  .  levothyroxine (SYNTHROID, LEVOTHROID) 125 MCG tablet, Take 125 mcg by mouth daily., Disp: , Rfl:  .  Melatonin 10 MG TABS, Take 10 mg by mouth at bedtime., Disp: , Rfl:  .  metoprolol tartrate (LOPRESSOR) 25 MG tablet, Take 1 tablet  (25 mg total) by mouth 2 (two) times daily., Disp: 60 tablet, Rfl: 0 .  Multiple Vitamin (MULTIVITAMIN WITH MINERALS) TABS tablet, Take 1 tablet by mouth daily., Disp: , Rfl:  .  nitroGLYCERIN (NITROSTAT) 0.4 MG SL tablet, Place 0.4 mg under the tongue every 5 (five) minutes as needed for chest pain. , Disp: , Rfl:  .  OXYGEN, Inhale 3 L into the lungs See admin instructions. Use oxygen whenever resting, Disp: , Rfl:  .  POTASSIUM PO, Take 1 tablet by mouth at bedtime., Disp: , Rfl:  .  promethazine (PHENERGAN) 25 MG tablet, Take 25 mg by mouth daily as needed for nausea or vomiting. , Disp: , Rfl:  .  Psyllium (METAMUCIL PO), Take 1 Dose by mouth daily. Mix in liquid and drink , Disp: , Rfl:  .  ranitidine (ZANTAC) 150 MG tablet, Take 1 tablet (150 mg total) by mouth at bedtime., Disp: 90 tablet, Rfl: 3 .  ranolazine (RANEXA) 1000 MG SR tablet, Take 1 tablet (1,000 mg total) by mouth 2 (two) times daily., Disp: 180 tablet, Rfl: 3 .  sacubitril-valsartan (ENTRESTO) 24-26 MG, Take 1 tablet by mouth 2 (two) times daily., Disp: 60 tablet, Rfl: 11 .  Tamsulosin HCl (FLOMAX) 0.4 MG CAPS, Take 0.4 mg by mouth at bedtime. , Disp: , Rfl:  .  torsemide (DEMADEX) 20 MG tablet, Take 1 tablet (20 mg total) by mouth daily. (  Patient not taking: Reported on 02/11/2018), Disp: 30 tablet, Rfl: 3 .  vitamin B-12 (CYANOCOBALAMIN) 1000 MCG tablet, Take 1,000 mcg by mouth daily., Disp: , Rfl:   Past Medical History: Past Medical History:  Diagnosis Date  . A-fib ([place])    a. Noted on 12/2012 interrogation, placed on Apixaban and ultimately stopped NOACs 2/2 personal decision 8/14.  Marland Kitchen AICD (automatic cardioverter/defibrillator) present    Cardiac defibrillator -dual  [facility]  . Arthritis   . Atrial tachycardia-non sustained    a. Noted on 03/2012 interrogation.  . Chronic systolic heart failure ([facility])    EF down to 20 to 25% per echo November 2012  . COPD (chronic obstructive pulmonary disease) ([place])   .  Hyperlipidemia   . Hypertension   . ICD (implantable cardiac defibrillator) in place   . NICM (nonischemic cardiomyopathy) ([place])    a. Normal cors 2000. b. Minimal plaque 2013.  . Old myocardial infarction    a. ?Silent MI. b. Large fixed inferolateral defect c/w with prior infarct, no ischemia. c. Cath in 2000/2013 with only minimal CAD.  Marland Kitchen Presence of permanent cardiac pacemaker   . Pulmonary nodule, right    Last scan in 2010 showing stability; felt to be benign.  Marland Kitchen PVC's (premature ventricular contractions)   . Ventricular tachycardia, polymorphic ([place])    Rx via ICD 12/14    Tobacco Use: Social History   Tobacco Use  Smoking Status Former Smoker  . Packs/day: 2.50  . Years: 40.00  . Pack years: 100.00  . Types: Cigarettes  . Last attempt to quit: 11/24/1983  . Years since quitting: 34.2  Smokeless Tobacco Never Used  Tobacco Comment   2 1/2 ppd x 40 years    Labs: Recent Review Flowsheet Data    Labs for ITP Cardiac and Pulmonary Rehab Latest Ref Rng & Units 08/10/2008 12/18/2011 12/18/2011 03/27/2013 04/17/2014   Cholestrol 0 - 200 mg/dL - - - - 113   LDLCALC 0 - 99 mg/dL - - - - 52   HDL >39.00 mg/dL - - - - 49.80   Trlycerides 0.0 - 149.0 mg/dL - - - - 58.0   Hemoglobin A1c <5.7 % - - - 6.0(H) -   PHART 7.350 - 7.450 - - 7.395 - -   PCO2ART 35.0 - 45.0 mmHg - - 38.1 - -   HCO3 20.0 - 24.0 mEq/L - 23.8 23.4 - -   TCO2 0 - 100 mmol/L '25 25 25 ' - -   ACIDBASEDEF 0.0 - 2.0 mmol/L - 2.0 1.0 - -   O2SAT % - 57.0 93.0 - -      Capillary Blood Glucose: Lab Results  Component Value Date   GLUCAP 109 (H) 07/18/2015     Pulmonary Assessment Scores: Pulmonary Assessment Scores    Row Name 02/17/18 1603 02/23/18 1155       ADL UCSD   ADL Phase  -  Entry    SOB Score total  -  87      CAT Score   CAT Score  -  22 Entry      mMRC Score   mMRC Score  3  -       Pulmonary Function Assessment: Pulmonary Function Assessment - 02/11/18 1431      Breath    Bilateral Breath Sounds  Clear;Decreased    Shortness of Breath  Limiting activity;Yes       Exercise Target Goals:    Exercise Program Goal: Individual exercise  prescription set using results from initial 6 min walk test and THRR while considering  patient's activity barriers and safety.    Exercise Prescription Goal: Initial exercise prescription builds to 30-45 minutes a day of aerobic activity, 2-3 days per week.  Home exercise guidelines will be given to patient during program as part of exercise prescription that the participant will acknowledge.  Activity Barriers & Risk Stratification: Activity Barriers & Cardiac Risk Stratification - 02/11/18 1408      Activity Barriers & Cardiac Risk Stratification   Activity Barriers  Shortness of Breath;Muscular Weakness;Deconditioning;Balance Concerns;History of Falls;Decreased Ventricular Function       6 Minute Walk: 6 Minute Walk    Row Name 02/17/18 1557         6 Minute Walk   Phase  Initial     Distance  1200 feet     Walk Time  6 minutes     # of Rest Breaks  0     MPH  2.27     METS  2.68     RPE  12     Perceived Dyspnea   1     Symptoms  No     Resting HR  66 bpm     Resting BP  108/67     Resting Oxygen Saturation   93 %     Exercise Oxygen Saturation  during 6 min walk  86 %     Max Ex. HR  87 bpm     Max Ex. BP  130/64       Interval HR   1 Minute HR  76     2 Minute HR  70     3 Minute HR  71     4 Minute HR  80     5 Minute HR  87     6 Minute HR  87     2 Minute Post HR  67     Interval Heart Rate?  Yes       Interval Oxygen   Interval Oxygen?  Yes     Baseline Oxygen Saturation %  93 %     1 Minute Oxygen Saturation %  88 %     1 Minute Liters of Oxygen  3 L     2 Minute Oxygen Saturation %  86 %     2 Minute Liters of Oxygen  4 L     3 Minute Oxygen Saturation %  86 %     3 Minute Liters of Oxygen  4 L     4 Minute Oxygen Saturation %  91 %     4 Minute Liters of Oxygen  4 L     5  Minute Oxygen Saturation %  87 %     5 Minute Liters of Oxygen  6 L     6 Minute Oxygen Saturation %  88 %     6 Minute Liters of Oxygen  6 L     2 Minute Post Oxygen Saturation %  95 %     2 Minute Post Liters of Oxygen  6 L        Oxygen Initial Assessment: Oxygen Initial Assessment - 02/17/18 1603      Initial 6 min Walk   Oxygen Used  Continuous;E-Tanks    Liters per minute  6      Program Oxygen Prescription   Program Oxygen Prescription  Continuous;E-Tanks    Liters per minute  6       Oxygen Re-Evaluation: Oxygen Re-Evaluation    Row Name 02/25/18 1554             Program Oxygen Prescription   Program Oxygen Prescription  Continuous;E-Tanks       Liters per minute  - 3-6         Home Oxygen   Home Oxygen Device  Portable Concentrator;Home Concentrator;E-Tanks       Sleep Oxygen Prescription  Continuous       Liters per minute  3       Home Exercise Oxygen Prescription  Pulsed       Liters per minute  4       Home at Rest Exercise Oxygen Prescription  Continuous       Liters per minute  3       Compliance with Home Oxygen Use  No         Goals/Expected Outcomes   Short Term Goals  To learn and exhibit compliance with exercise, home and travel O2 prescription;To learn and understand importance of monitoring SPO2 with pulse oximeter and demonstrate accurate use of the pulse oximeter.;To learn and understand importance of maintaining oxygen saturations>88%;To learn and demonstrate proper pursed lip breathing techniques or other breathing techniques.       Long  Term Goals  Exhibits compliance with exercise, home and travel O2 prescription;Verbalizes importance of monitoring SPO2 with pulse oximeter and return demonstration;Maintenance of O2 saturations>88%;Exhibits proper breathing techniques, such as pursed lip breathing or other method taught during program session;Compliance with respiratory medication       Goals/Expected Outcomes  compliance           Oxygen Discharge (Final Oxygen Re-Evaluation): Oxygen Re-Evaluation - 02/25/18 1554      Program Oxygen Prescription   Program Oxygen Prescription  Continuous;E-Tanks    Liters per minute  -- 3-6      Home Oxygen   Home Oxygen Device  Portable Concentrator;Home Concentrator;E-Tanks    Sleep Oxygen Prescription  Continuous    Liters per minute  3    Home Exercise Oxygen Prescription  Pulsed    Liters per minute  4    Home at Rest Exercise Oxygen Prescription  Continuous    Liters per minute  3    Compliance with Home Oxygen Use  No      Goals/Expected Outcomes   Short Term Goals  To learn and exhibit compliance with exercise, home and travel O2 prescription;To learn and understand importance of monitoring SPO2 with pulse oximeter and demonstrate accurate use of the pulse oximeter.;To learn and understand importance of maintaining oxygen saturations>88%;To learn and demonstrate proper pursed lip breathing techniques or other breathing techniques.    Long  Term Goals  Exhibits compliance with exercise, home and travel O2 prescription;Verbalizes importance of monitoring SPO2 with pulse oximeter and return demonstration;Maintenance of O2 saturations>88%;Exhibits proper breathing techniques, such as pursed lip breathing or other method taught during program session;Compliance with respiratory medication    Goals/Expected Outcomes  compliance       Initial Exercise Prescription: Initial Exercise Prescription - 02/17/18 1600      Date of Initial Exercise RX and Referring Provider   Date  02/17/18    Referring Provider  Dr. Nelda Marseille      Oxygen   Oxygen  Continuous    Liters  6      NuStep   Level  2    SPM  80    Minutes  17    METs  1.5      Arm Ergometer   Level  2    Watts  10    Minutes  17      Track   Laps  5    Minutes  17    METs  1      Prescription Details   Frequency (times per week)  2    Duration  Progress to 45 minutes of aerobic exercise without  signs/symptoms of physical distress      Intensity   THRR 40-80% of Max Heartrate  57-114    Ratings of Perceived Exertion  11-13    Perceived Dyspnea  0-4      Progression   Progression  Continue progressive overload as per policy without signs/symptoms or physical distress.      Resistance Training   Training Prescription  Yes    Weight  blue bands    Reps  10-15       Perform Capillary Blood Glucose checks as needed.  Exercise Prescription Changes: Exercise Prescription Changes    Row Name 02/22/18 1535             Response to Exercise   Blood Pressure (Admit)  90/42       Blood Pressure (Exercise)  98/52       Blood Pressure (Exit)  112/60       Heart Rate (Admit)  64 bpm       Heart Rate (Exercise)  89 bpm       Heart Rate (Exit)  63 bpm       Oxygen Saturation (Admit)  93 %       Oxygen Saturation (Exercise)  89 %       Oxygen Saturation (Exit)  95 %       Rating of Perceived Exertion (Exercise)  13       Perceived Dyspnea (Exercise)  2       Duration  Progress to 45 minutes of aerobic exercise without signs/symptoms of physical distress 40-80% HRR       Intensity  THRR unchanged         Progression   Progression  Continue to progress workloads to maintain intensity without signs/symptoms of physical distress.         Resistance Training   Training Prescription  Yes       Weight  blue bands       Reps  10-15         Oxygen   Oxygen  Continuous       Liters  6         NuStep   Level  2       SPM  80       Minutes  17       METs  2         Arm Ergometer   Level  2       Watts  10       Minutes  17         Track   Laps  13       Minutes  17       METs  1          Exercise Comments:   Exercise Goals and Review: Exercise Goals    Row Name 02/11/18 1411             Exercise Goals   Increase Physical Activity  Yes  Intervention  Provide advice, education, support and counseling about physical activity/exercise needs.;Develop an  individualized exercise prescription for aerobic and resistive training based on initial evaluation findings, risk stratification, comorbidities and participant's personal goals.       Expected Outcomes  Long Term: Add in home exercise to make exercise part of routine and to increase amount of physical activity.;Long Term: Exercising regularly at least 3-5 days a week.       Increase Strength and Stamina  Yes       Intervention  Develop an individualized exercise prescription for aerobic and resistive training based on initial evaluation findings, risk stratification, comorbidities and participant's personal goals.;Provide advice, education, support and counseling about physical activity/exercise needs.       Expected Outcomes  Short Term: Perform resistance training exercises routinely during rehab and add in resistance training at home;Long Term: Improve cardiorespiratory fitness, muscular endurance and strength as measured by increased METs and functional capacity (6MWT);Short Term: Increase workloads from initial exercise prescription for resistance, speed, and METs.       Able to understand and use rate of perceived exertion (RPE) scale  Yes       Intervention  Provide education and explanation on how to use RPE scale       Expected Outcomes  Short Term: Able to use RPE daily in rehab to express subjective intensity level;Long Term:  Able to use RPE to guide intensity level when exercising independently       Able to understand and use Dyspnea scale  Yes       Intervention  Provide education and explanation on how to use Dyspnea scale       Expected Outcomes  Short Term: Able to use Dyspnea scale daily in rehab to express subjective sense of shortness of breath during exertion;Long Term: Able to use Dyspnea scale to guide intensity level when exercising independently       Knowledge and understanding of Target Heart Rate Range (THRR)  Yes       Intervention  Provide education and explanation of THRR  including how the numbers were predicted and where they are located for reference       Expected Outcomes  Short Term: Able to use daily as guideline for intensity in rehab;Short Term: Able to state/look up THRR;Long Term: Able to use THRR to govern intensity when exercising independently       Understanding of Exercise Prescription  Yes       Intervention  Provide education, explanation, and written materials on patient's individual exercise prescription       Expected Outcomes  Short Term: Able to explain program exercise prescription;Long Term: Able to explain home exercise prescription to exercise independently          Exercise Goals Re-Evaluation : Exercise Goals Re-Evaluation    Row Name 02/25/18 1555             Exercise Goal Re-Evaluation   Exercise Goals Review  Able to understand and use Dyspnea scale;Increase Strength and Stamina;Increase Physical Activity;Able to understand and use rate of perceived exertion (RPE) scale;Knowledge and understanding of Target Heart Rate Range (THRR);Understanding of Exercise Prescription       Comments  Patient has only attended 2 rehab sessions. Will cont. to monitor and progress as able.        Expected Outcomes  Through exercise at rehab and at home, patient will increase strength and stamina. The patient will also gain the confidence and knowledge to maintain an  exercise regime at home.           Discharge Exercise Prescription (Final Exercise Prescription Changes): Exercise Prescription Changes - 02/22/18 1535      Response to Exercise   Blood Pressure (Admit)  90/42    Blood Pressure (Exercise)  98/52    Blood Pressure (Exit)  112/60    Heart Rate (Admit)  64 bpm    Heart Rate (Exercise)  89 bpm    Heart Rate (Exit)  63 bpm    Oxygen Saturation (Admit)  93 %    Oxygen Saturation (Exercise)  89 %    Oxygen Saturation (Exit)  95 %    Rating of Perceived Exertion (Exercise)  13    Perceived Dyspnea (Exercise)  2    Duration   Progress to 45 minutes of aerobic exercise without signs/symptoms of physical distress 40-80% HRR    Intensity  THRR unchanged      Progression   Progression  Continue to progress workloads to maintain intensity without signs/symptoms of physical distress.      Resistance Training   Training Prescription  Yes    Weight  blue bands    Reps  10-15      Oxygen   Oxygen  Continuous    Liters  6      NuStep   Level  2    SPM  80    Minutes  17    METs  2      Arm Ergometer   Level  2    Watts  10    Minutes  17      Track   Laps  13    Minutes  17    METs  1       Nutrition:  Target Goals: Understanding of nutrition guidelines, daily intake of sodium <1559m, cholesterol <209m calories 30% from fat and 7% or less from saturated fats, daily to have 5 or more servings of fruits and vegetables.  Biometrics: Pre Biometrics - 02/11/18 1457      Pre Biometrics   Grip Strength  35 kg        Nutrition Therapy Plan and Nutrition Goals:   Nutrition Assessments:   Nutrition Goals Re-Evaluation:   Nutrition Goals Discharge (Final Nutrition Goals Re-Evaluation):   Psychosocial: Target Goals: Acknowledge presence or absence of significant depression and/or stress, maximize coping skills, provide positive support system. Participant is able to verbalize types and ability to use techniques and skills needed for reducing stress and depression.  Initial Review & Psychosocial Screening: Initial Psych Review & Screening - 02/11/18 1433      Initial Review   Current issues with  Current Sleep Concerns      Family Dynamics   Good Support System?  Yes    Comments  children, wife, and siblings      Barriers   Psychosocial barriers to participate in program  There are no identifiable barriers or psychosocial needs.      Screening Interventions   Interventions  Encouraged to exercise       Quality of Life Scores:  Scores of 19 and below usually indicate a poorer  quality of life in these areas.  A difference of  2-3 points is a clinically meaningful difference.  A difference of 2-3 points in the total score of the Quality of Life Index has been associated with significant improvement in overall quality of life, self-image, physical symptoms, and general health in studies assessing change in  quality of life.   PHQ-9: Recent Review Flowsheet Data    Depression screen [facility] 2/9 02/11/2018 03/09/2016   Decreased Interest 0 0   Down, Depressed, Hopeless 0 0   PHQ - 2 Score 0 0     Interpretation of Total Score  Total Score Depression Severity:  1-4 = Minimal depression, 5-9 = Mild depression, 10-14 = Moderate depression, 15-19 = Moderately severe depression, 20-27 = Severe depression   Psychosocial Evaluation and Intervention: Psychosocial Evaluation - 02/11/18 1447      Psychosocial Evaluation & Interventions   Interventions  Encouraged to exercise with the program and follow exercise prescription    Expected Outcomes  patient will remain free from psychosocial barriers to participation    Continue Psychosocial Services   No Follow up required       Psychosocial Re-Evaluation: Psychosocial Re-Evaluation    [place] Name 02/28/18 1642             Psychosocial Re-Evaluation   Current issues with  Current Sleep Concerns       Expected Outcomes  patient will remain free from psychosocial barriers to participation in pulmonary rehab       Interventions  Encouraged to attend Pulmonary Rehabilitation for the exercise       Continue Psychosocial Services   Follow up required by staff          Psychosocial Discharge (Final Psychosocial Re-Evaluation): Psychosocial Re-Evaluation - 02/28/18 1642      Psychosocial Re-Evaluation   Current issues with  Current Sleep Concerns    Expected Outcomes  patient will remain free from psychosocial barriers to participation in pulmonary rehab    Interventions  Encouraged to attend Pulmonary Rehabilitation for the  exercise    Continue Psychosocial Services   Follow up required by staff       Education: Education Goals: Education classes will be provided on a weekly basis, covering required topics. Participant will state understanding/return demonstration of topics presented.  Learning Barriers/Preferences: Learning Barriers/Preferences - 02/11/18 1431      Learning Barriers/Preferences   Learning Barriers  None    Learning Preferences  Group Instruction;Individual Instruction;Skilled Demonstration;Verbal Instruction       Education Topics: Risk Factor Reduction:  -Group instruction that is supported by a PowerPoint presentation. Instructor discusses the definition of a risk factor, different risk factors for pulmonary disease, and how the heart and lungs work together.     Nutrition for Pulmonary Patient:  -Group instruction provided by PowerPoint slides, verbal discussion, and written materials to support subject matter. The instructor gives an explanation and review of healthy diet recommendations, which includes a discussion on weight management, recommendations for fruit and vegetable consumption, as well as protein, fluid, caffeine, fiber, sodium, sugar, and alcohol. Tips for eating when patients are short of breath are discussed.   Pursed Lip Breathing:  -Group instruction that is supported by demonstration and informational handouts. Instructor discusses the benefits of pursed lip and diaphragmatic breathing and detailed demonstration on how to preform both.     Oxygen Safety:  -Group instruction provided by PowerPoint, verbal discussion, and written material to support subject matter. There is an overview of "What is Oxygen" and "Why do we need it".  Instructor also reviews how to create a safe environment for oxygen use, the importance of using oxygen as prescribed, and the risks of noncompliance. There is a brief discussion on traveling with oxygen and resources the patient may  utilize.   Oxygen Equipment:  -Group instruction  provided by [facility] Staff utilizing handouts, written materials, and equipment demonstrations.   Signs and Symptoms:  -Group instruction provided by written material and verbal discussion to support subject matter. Warning signs and symptoms of infection, stroke, and heart attack are reviewed and when to call the physician/911 reinforced. Tips for preventing the spread of infection discussed.   Advanced Directives:  -Group instruction provided by verbal instruction and written material to support subject matter. Instructor reviews Advanced Directive laws and proper instruction for filling out document.   Pulmonary Video:  -Group video education that reviews the importance of medication and oxygen compliance, exercise, good nutrition, pulmonary hygiene, and pursed lip and diaphragmatic breathing for the pulmonary patient.   Exercise for the Pulmonary Patient:  -Group instruction that is supported by a PowerPoint presentation. Instructor discusses benefits of exercise, core components of exercise, frequency, duration, and intensity of an exercise routine, importance of utilizing pulse oximetry during exercise, safety while exercising, and options of places to exercise outside of rehab.     Pulmonary Medications:  -Verbally interactive group education provided by instructor with focus on inhaled medications and proper administration.   Anatomy and Physiology of the Respiratory System and Intimacy:  -Group instruction provided by PowerPoint, verbal discussion, and written material to support subject matter. Instructor reviews respiratory cycle and anatomical components of the respiratory system and their functions. Instructor also reviews differences in obstructive and restrictive respiratory diseases with examples of each. Intimacy, Sex, and Sexuality differences are reviewed with a discussion on how relationships can change when diagnosed  with pulmonary disease. Common sexual concerns are reviewed.   MD DAY -A group question and answer session with a medical doctor that allows participants to ask questions that relate to their pulmonary disease state.   OTHER EDUCATION -Group or individual verbal, written, or video instructions that support the educational goals of the pulmonary rehab program.   PULMONARY REHAB OTHER RESPIRATORY from 02/24/2018 in [place]  Date  02/24/18 Rodney Booze a sedentary lifestyle]  Educator  EP  Instruction Review Code  1- [employer] Eating Survival Tips:  -Group instruction provided by PowerPoint slides, verbal discussion, and written materials to support subject matter. The instructor gives patients tips, tricks, and techniques to help them not only survive but enjoy the holidays despite the onslaught of food that accompanies the holidays.   Knowledge Questionnaire Score: Knowledge Questionnaire Score - 02/23/18 1155      Knowledge Questionnaire Score   Pre Score  16/18       Core Components/Risk Factors/Patient Goals at Admission: Personal Goals and Risk Factors at Admission - 02/11/18 1432      Core Components/Risk Factors/Patient Goals on Admission   Improve shortness of breath with ADL's  Yes    Intervention  Provide education, individualized exercise plan and daily activity instruction to help decrease symptoms of SOB with activities of daily living.    Expected Outcomes  Short Term: Improve cardiorespiratory fitness to achieve a reduction of symptoms when performing ADLs;Long Term: Be able to perform more ADLs without symptoms or delay the onset of symptoms    Heart Failure  Yes    Intervention  Provide a combined exercise and nutrition program that is supplemented with education, support and counseling about heart failure. Directed toward relieving symptoms such as shortness of breath, decreased exercise tolerance, and extremity  edema.    Expected Outcomes  Improve functional capacity of life;Short term: Attendance in program  2-3 days a week with increased exercise capacity. Reported lower sodium intake. Reported increased fruit and vegetable intake. Reports medication compliance.;Short term: Daily weights obtained and reported for increase. Utilizing diuretic protocols set by physician.;Long term: Adoption of self-care skills and reduction of barriers for early signs and symptoms recognition and intervention leading to self-care maintenance.       Core Components/Risk Factors/Patient Goals Review:  Goals and Risk Factor Review    Row Name 02/28/18 1641             Core Components/Risk Factors/Patient Goals Review   Personal Goals Review  Improve shortness of breath with ADL's;Develop more efficient breathing techniques such as purse lipped breathing and diaphragmatic breathing and practicing self-pacing with activity.;Heart Failure       Review  patient has only attended 2 exercise sessions since admission. too early to evaluate progression towards pulmonary rehab goals. will re-evaluate in 30 days and expect to see progress       Expected Outcomes  see admission expected outcomes.          Core Components/Risk Factors/Patient Goals at Discharge (Final Review):  Goals and Risk Factor Review - 02/28/18 1641      Core Components/Risk Factors/Patient Goals Review   Personal Goals Review  Improve shortness of breath with ADL's;Develop more efficient breathing techniques such as purse lipped breathing and diaphragmatic breathing and practicing self-pacing with activity.;Heart Failure    Review  patient has only attended 2 exercise sessions since admission. too early to evaluate progression towards pulmonary rehab goals. will re-evaluate in 30 days and expect to see progress    Expected Outcomes  see admission expected outcomes.       ITP Comments:   Comments: patient has attended 2 pulmonary rehab sessions since  admission

## 2018-03-01 ENCOUNTER — Encounter (HOSPITAL_COMMUNITY)
Admission: RE | Admit: 2018-03-01 | Discharge: 2018-03-01 | Disposition: A | Discharge status: Home / Self Care | Payer: PPO | Source: Ambulatory Visit | Attending: Pulmonary Disease | Admitting: Pulmonary Disease

## 2018-03-01 DIAGNOSIS — J439 Emphysema, unspecified: Secondary | ICD-10-CM

## 2018-03-01 DIAGNOSIS — J438 Other emphysema: Secondary | ICD-10-CM | POA: Diagnosis not present

## 2018-03-01 NOTE — Progress Notes (Signed)
Daily Session Note  Patient Details  Name: Corey Tyler MRN: [id number] Date of Birth: 08/20/1941 Referring Provider:     Pulmonary Rehab Walk Test from 02/15/2018 in [facility]  Referring Provider  Dr. Yacoub      Encounter Date: 03/01/2018  Check In: Session Check In - 03/01/18 1516      Check-In   Location  [facility]    Staff Present  Portia Payne, RN, BSN;Molly diVincenzo, MS, ACSM RCEP, Exercise Physiologist; , RN    Supervising physician immediately available to respond to emergencies  [facility] immediately available    Physician(s)  Dr. Nettey    Medication changes reported      No    Fall or balance concerns reported     No    Tobacco Cessation  No Change    Warm-up and Cool-down  Performed as group-led instruction    Resistance Training Performed  Yes    VAD Patient?  No      Pain Assessment   Currently in Pain?  No/denies    Multiple Pain Sites  No       Capillary Blood Glucose: No results found for this or any previous visit (from the past 24 hour(s)).    Social History   Tobacco Use  Smoking Status Former Smoker  . Packs/day: 2.50  . Years: 40.00  . Pack years: 100.00  . Types: Cigarettes  . Last attempt to quit: 11/24/1983  . Years since quitting: 34.2  Smokeless Tobacco Never Used  Tobacco Comment   2 1/2 ppd x 40 years    Goals Met:  Exercise tolerated well No report of cardiac concerns or symptoms Strength training completed today  Goals Unmet:  Not Applicable  Comments: Service time is from 1330 to 1500    Dr. Wesam G. Yacoub is Medical Director for Pulmonary Rehab at [facility]. 

## 2018-03-03 ENCOUNTER — Encounter (HOSPITAL_COMMUNITY)
Admission: RE | Admit: 2018-03-03 | Discharge: 2018-03-03 | Disposition: A | Discharge status: Home / Self Care | Payer: PPO | Source: Ambulatory Visit | Attending: Pulmonary Disease | Admitting: Pulmonary Disease

## 2018-03-03 DIAGNOSIS — J438 Other emphysema: Secondary | ICD-10-CM | POA: Diagnosis not present

## 2018-03-03 DIAGNOSIS — J439 Emphysema, unspecified: Secondary | ICD-10-CM

## 2018-03-03 NOTE — Progress Notes (Signed)
Daily Session Note  Patient Details  Name: Corey Tyler MRN: [id number] Date of Birth: Apr 10, 1941 Referring Provider:     Pulmonary Rehab Walk Test from 02/15/2018 in [place]  Referring Provider  Dr. Nelda Marseille      Encounter Date: 03/03/2018  Check In: Session Check In - 03/03/18 1408      Check-In   Location  [facility]    Staff Present  Trish Fountain, RN, BSN;Devanee Pomplun, MS, ACSM RCEP, Exercise Physiologist;Carlette Wilber Oliphant, RN, BSN    Supervising physician immediately available to respond to emergencies  [facility] immediately available    Physician(s)  Dr. Alfredia Ferguson    Medication changes reported      No    Fall or balance concerns reported     No    Tobacco Cessation  No Change    Warm-up and Cool-down  Performed as group-led instruction    Resistance Training Performed  Yes    VAD Patient?  No      Pain Assessment   Currently in Pain?  No/denies    Multiple Pain Sites  No       Capillary Blood Glucose: No results found for this or any previous visit (from the past 24 hour(s)).    Social History   Tobacco Use  Smoking Status Former Smoker  . Packs/day: 2.50  . Years: 40.00  . Pack years: 100.00  . Types: Cigarettes  . Last attempt to quit: 11/24/1983  . Years since quitting: 34.2  Smokeless Tobacco Never Used  Tobacco Comment   2 1/2 ppd x 40 years    Goals Met:  Exercise tolerated well No report of cardiac concerns or symptoms Strength training completed today  Goals Unmet:  Not Applicable  Comments: Service time is from 1:30p to 3:30p    Dr. Rush Farmer is Medical Director for Pulmonary Rehab at [facility].

## 2018-03-08 ENCOUNTER — Telehealth: Payer: Self-pay

## 2018-03-08 ENCOUNTER — Encounter (HOSPITAL_COMMUNITY)
Admission: RE | Admit: 2018-03-08 | Discharge: 2018-03-08 | Disposition: A | Discharge status: Home / Self Care | Payer: PPO | Source: Ambulatory Visit | Attending: Pulmonary Disease | Admitting: Pulmonary Disease

## 2018-03-08 VITALS — Wt 227.3 lb

## 2018-03-08 DIAGNOSIS — J438 Other emphysema: Secondary | ICD-10-CM | POA: Diagnosis not present

## 2018-03-08 DIAGNOSIS — J439 Emphysema, unspecified: Secondary | ICD-10-CM

## 2018-03-08 NOTE — Progress Notes (Signed)
Daily Session Note  Patient Details  Name: LENVIL SWAIM MRN: [id number] Date of Birth: 1941/08/25 Referring Provider:     Pulmonary Rehab Walk Test from 02/15/2018 in [place]  Referring Provider  Dr. Nelda Marseille      Encounter Date: 03/08/2018  Check In: Session Check In - 03/08/18 1537      Check-In   Location  [facility]    Staff Present  Rodney Langton, RN;Molly DiVincenzo, MS, ACSM RCEP, Exercise Physiologist;Kadeem Hyle [name], RN, BSN    Supervising physician immediately available to respond to emergencies  [facility] immediately available    Physician(s)  Dr. Alfredia Ferguson    Medication changes reported      No    Fall or balance concerns reported     No    Tobacco Cessation  No Change    Warm-up and Cool-down  Performed as group-led instruction    Resistance Training Performed  Yes    VAD Patient?  No      Pain Assessment   Currently in Pain?  No/denies    Multiple Pain Sites  No       Capillary Blood Glucose: No results found for this or any previous visit (from the past 24 hour(s)).  Exercise Prescription Changes - 03/08/18 1555      Response to Exercise   Blood Pressure (Admit)  106/60    Blood Pressure (Exercise)  108/72    Blood Pressure (Exit)  106/62    Heart Rate (Admit)  69 bpm    Heart Rate (Exercise)  85 bpm    Heart Rate (Exit)  65 bpm    Oxygen Saturation (Admit)  91 %    Oxygen Saturation (Exercise)  88 %    Oxygen Saturation (Exit)  89 %    Rating of Perceived Exertion (Exercise)  13    Perceived Dyspnea (Exercise)  2    Duration  Progress to 45 minutes of aerobic exercise without signs/symptoms of physical distress 40-80% HRR    Intensity  THRR unchanged      Progression   Progression  Continue to progress workloads to maintain intensity without signs/symptoms of physical distress.      Resistance Training   Training Prescription  Yes    Weight  blue bands    Reps  10-15      Oxygen   Oxygen   Continuous    Liters  6      NuStep   Level  2    SPM  80    Minutes  17    METs  1.9      Arm Ergometer   Level  3    Watts  10    Minutes  17      Track   Laps  15    Minutes  17       Social History   Tobacco Use  Smoking Status Former Smoker  . Packs/day: 2.50  . Years: 40.00  . Pack years: 100.00  . Types: Cigarettes  . Last attempt to quit: 11/24/1983  . Years since quitting: 34.3  Smokeless Tobacco Never Used  Tobacco Comment   2 1/2 ppd x 40 years    Goals Met:  Independence with exercise equipment Improved SOB with ADL's Using PLB without cueing & demonstrates good technique Exercise tolerated well No report of cardiac concerns or symptoms Strength training completed today  Goals Unmet:  Not Applicable  Comments: Service time is  from 1330 to 1515   Dr. Rush Farmer is Medical Director for Pulmonary Rehab at [facility].

## 2018-03-08 NOTE — Telephone Encounter (Signed)
LVM for call back to review ATP episode

## 2018-03-09 NOTE — Progress Notes (Signed)
CHICK COUSINS 77 y.o. male  DOB: [birth year] MRN: [id number]           Nutrition Note 1. Pulmonary emphysema, unspecified emphysema type ([place])    Past Medical History:  Diagnosis Date  . A-fib ([employer])    a. Noted on 12/2012 interrogation, placed on Apixaban and ultimately stopped NOACs 2/2 personal decision 8/14.  Marland Kitchen AICD (automatic cardioverter/defibrillator) present    Cardiac defibrillator -dual  [facility]  . Arthritis   . Atrial tachycardia-non sustained    a. Noted on 03/2012 interrogation.  . Chronic systolic heart failure (H[facility])    EF down to 20 to 25% per echo November 2012  . COPD (chronic obstructive pulmonary disease) ([place])   . Hyperlipidemia   . Hypertension   . ICD (implantable cardiac defibrillator) in place   . NICM (nonischemic cardiomyopathy) ([place])    a. Normal cors 2000. b. Minimal plaque 2013.  . Old myocardial infarction    a. ?Silent MI. b. Large fixed inferolateral defect c/w with prior infarct, no ischemia. c. Cath in 2000/2013 with only minimal CAD.  Marland Kitchen Presence of permanent cardiac pacemaker   . Pulmonary nodule, right    Last scan in 2010 showing stability; felt to be benign.  Marland Kitchen PVC's (premature ventricular contractions)   . Ventricular tachycardia, polymorphic ([place])    Rx via ICD 12/14   Meds reviewed. Cholecalciferol, MVI, Vitamin B-12, Potassium    Ht: Ht Readings from Last 1 Encounters:  02/11/18 6\' 1"  (1.854 m)   Wt:  Wt Readings from Last 10 Encounters:  03/08/18 227 lb 4.7 oz (103.1 kg)  02/22/18 223 lb 15.8 oz (101.6 kg)  02/11/18 224 lb 10.4 oz (101.9 kg)  12/29/17 222 lb 6.4 oz (100.9 kg)  10/21/17 216 lb 6.4 oz (98.2 kg)  09/19/17 205 lb 14.6 oz (93.4 kg)  09/03/17 210 lb (95.3 kg)  07/13/17 207 lb 12.8 oz (94.3 kg)  06/10/17 202 lb (91.6 kg)  05/06/17 202 lb 12.8 oz (92 kg)   BMI: Body mass index is 29.99 kg/m.    Current tobacco use? No. Former smoker, quit in 1985.  Labs:  Lipid Panel     Component Value Date/Time   CHOL 113  04/17/2014 0854   TRIG 58.0 04/17/2014 0854   HDL 49.80 04/17/2014 0854   CHOLHDL 2 04/17/2014 0854   VLDL 11.6 04/17/2014 0854   LDLCALC 52 04/17/2014 0854    Lab Results  Component Value Date   HGBA1C 6.0 (H) 03/27/2013    Nutrition Diagnosis ? Food-and nutrition-related knowledge deficit related to lack of exposure to information as related to diagnosis of pulmonary disease  Goal(s) 1. To be determined  Plan:  Pt to attend Pulmonary Nutrition class Will provide client-centered nutrition education as part of interdisciplinary care.   Monitor and evaluate progress toward nutrition goal with team.  Monitor and Evaluate progress toward nutrition goal with team.   Ranell Patrick, Dietetic Intern 03/09/2018 10:38 AM

## 2018-03-10 ENCOUNTER — Encounter (HOSPITAL_COMMUNITY)
Admission: RE | Admit: 2018-03-10 | Discharge: 2018-03-10 | Disposition: A | Discharge status: Home / Self Care | Payer: PPO | Source: Ambulatory Visit | Attending: Pulmonary Disease | Admitting: Pulmonary Disease

## 2018-03-10 DIAGNOSIS — J438 Other emphysema: Secondary | ICD-10-CM | POA: Diagnosis not present

## 2018-03-10 DIAGNOSIS — J439 Emphysema, unspecified: Secondary | ICD-10-CM

## 2018-03-10 NOTE — Progress Notes (Signed)
Daily Session Note  Patient Details  Name: JALIEL DEAVERS MRN: [id number] Date of Birth: 05-22-1941 Referring Provider:     Pulmonary Rehab Walk Test from 02/15/2018 in [place]  Referring Provider  Dr. Nelda Marseille      Encounter Date: 03/10/2018  Check In: Session Check In - 03/10/18 1404      Check-In   Location  [facility]    Staff Present  Rodney Langton, RN;Molly DiVincenzo, MS, ACSM RCEP, Exercise Physiologist;Portia [name], RN, BSN    Supervising physician immediately available to respond to emergencies  [facility] immediately available    Physician(s)  Dr. Cruzita Lederer    Medication changes reported      No    Fall or balance concerns reported     No    Tobacco Cessation  No Change    Warm-up and Cool-down  Performed as group-led instruction    Resistance Training Performed  Yes    VAD Patient?  No      Pain Assessment   Currently in Pain?  No/denies    Multiple Pain Sites  No       Capillary Blood Glucose: No results found for this or any previous visit (from the past 24 hour(s)).    Social History   Tobacco Use  Smoking Status Former Smoker  . Packs/day: 2.50  . Years: 40.00  . Pack years: 100.00  . Types: Cigarettes  . Last attempt to quit: 11/24/1983  . Years since quitting: 34.3  Smokeless Tobacco Never Used  Tobacco Comment   2 1/2 ppd x 40 years    Goals Met:  Exercise tolerated well No report of cardiac concerns or symptoms Strength training completed today  Goals Unmet:  Not Applicable  Comments: Service time is from 1330 to 1540    Dr. Rush Farmer is Medical Director for Pulmonary Rehab at [facility].

## 2018-03-10 NOTE — Telephone Encounter (Signed)
Reviewed episode with patient. Patient denies symptoms at time of event and reports taking all medication as prescribed.   He states that his ranexa is very expensive and so he is getting it at the [place]. However he was told by Dr. Higinio Plan at the [facility] cardiology office that he would not be re-ordering his Ranexa unless he had documentation from Dr. [name] on "why" he was taking the Ranexa. Patient is very concerned that he will run out - stating that his heart has "really calmed down" since taking Ranexa. I explained that I will review this with Dr. [name] when he is in the office on 4/18. Patient verbalized understating.

## 2018-03-14 ENCOUNTER — Other Ambulatory Visit: Payer: Self-pay | Admitting: Internal Medicine

## 2018-03-15 ENCOUNTER — Encounter (HOSPITAL_COMMUNITY)
Admission: RE | Admit: 2018-03-15 | Discharge: 2018-03-15 | Disposition: A | Discharge status: Home / Self Care | Payer: PPO | Source: Ambulatory Visit | Attending: Pulmonary Disease | Admitting: Pulmonary Disease

## 2018-03-15 DIAGNOSIS — J438 Other emphysema: Secondary | ICD-10-CM | POA: Diagnosis not present

## 2018-03-15 NOTE — Progress Notes (Signed)
Daily Session Note  Patient Details  Name: RONDEL EPISCOPO MRN: [id number] Date of Birth: [birth year] Referring Provider:     Pulmonary Rehab Walk Test from 02/15/2018 in [place]  Referring Provider  Dr. Nelda Marseille      Encounter Date: 03/15/2018  Check In: Session Check In - 03/15/18 1508      Check-In   Location  [facility]    Staff Present  Rodney Langton, RN;Molly DiVincenzo, MS, ACSM RCEP, Exercise Physiologist    Supervising physician immediately available to respond to emergencies  [facility] immediately available    Physician(s)  Dr. Maylene Roes    Medication changes reported      No    Fall or balance concerns reported     No    Tobacco Cessation  No Change    Warm-up and Cool-down  Performed as group-led instruction    Resistance Training Performed  Yes    VAD Patient?  No      Pain Assessment   Currently in Pain?  No/denies    Multiple Pain Sites  No       Capillary Blood Glucose: No results found for this or any previous visit (from the past 24 hour(s)).    Social History   Tobacco Use  Smoking Status Former Smoker  . Packs/day: 2.50  . Years: 40.00  . Pack years: 100.00  . Types: Cigarettes  . Last attempt to quit: 11/24/1983  . Years since quitting: 34.3  Smokeless Tobacco Never Used  Tobacco Comment   2 1/2 ppd x 40 years    Goals Met:  Exercise tolerated well No report of cardiac concerns or symptoms Strength training completed today  Goals Unmet:  Not Applicable  Comments: Service time is from 1330 to 1500    Dr. Rush Farmer is Medical Director for Pulmonary Rehab at [facility].

## 2018-03-15 NOTE — Telephone Encounter (Signed)
Cyril Mourning  I hope this helps    Ranolazine reduces ventricular tachycardia burden and ICD shocks in patients with drug?refractory ICD shocks TJ Marjorie Smolder, D Murdock. - Pacing and Clinical ., 2011 - Wiley Online Library   Impact of ranolazine on ventricular arrhythmias-A systematic review G Bazoukis, Curt Jews, KP [place]. - . of arrhythmia, 2018 - Wiley Online Library  Vida Rigger., Daubert, J.P., Roxy Cedar, J.D., Owens Shark, M.W., Sonia Side., McNitt, Donnella Bi, M.K., 2018. Ranolazine in high-risk patients with implanted cardioverter-defibrillators: the RAID trial. Journal of the [employer] of Cardiology, 72(6), [phone].

## 2018-03-16 NOTE — Telephone Encounter (Signed)
Spoke with patient who stated that he was still having difficulty getting Ranexa re-ordered. I offered to have a letter printed for Dr. Higinio Plan and placed at the front desk for patient to take to Dr. Stephenie Acres office. Patient was agreeable to this plan.

## 2018-03-17 ENCOUNTER — Encounter (HOSPITAL_COMMUNITY)
Admission: RE | Admit: 2018-03-17 | Discharge: 2018-03-17 | Disposition: A | Discharge status: Home / Self Care | Payer: PPO | Source: Ambulatory Visit | Attending: Pulmonary Disease | Admitting: Pulmonary Disease

## 2018-03-17 DIAGNOSIS — M549 Dorsalgia, unspecified: Secondary | ICD-10-CM | POA: Diagnosis not present

## 2018-03-17 DIAGNOSIS — M47816 Spondylosis without myelopathy or radiculopathy, lumbar region: Secondary | ICD-10-CM | POA: Diagnosis not present

## 2018-03-17 DIAGNOSIS — S39012A Strain of muscle, fascia and tendon of lower back, initial encounter: Secondary | ICD-10-CM | POA: Diagnosis not present

## 2018-03-17 DIAGNOSIS — J439 Emphysema, unspecified: Secondary | ICD-10-CM

## 2018-03-17 LAB — CUP PACEART REMOTE DEVICE CHECK
Battery Remaining Percentage: 34 %
Brady Statistic AS VS Percent: 32 %
Brady Statistic RV Percent Paced: 1.1 %
Date Time Interrogation Session: 20190402100500
HIGH POWER IMPEDANCE MEASURED VALUE: 77 Ohm
HIGH POWER IMPEDANCE MEASURED VALUE: 77 Ohm
Implantable Lead Implant Date: 20130213
Implantable Lead Implant Date: 20130213
Implantable Lead Location: 753859
Implantable Pulse Generator Implant Date: 20130213
Lead Channel Impedance Value: 360 Ohm
Lead Channel Pacing Threshold Amplitude: 0.5 V
Lead Channel Pacing Threshold Pulse Width: 0.6 ms
Lead Channel Setting Pacing Amplitude: 2.5 V
Lead Channel Setting Pacing Pulse Width: 0.6 ms
MDC IDC LEAD LOCATION: 753860
MDC IDC MSMT BATTERY REMAINING LONGEVITY: 32 mo
MDC IDC MSMT BATTERY VOLTAGE: 2.86 V
MDC IDC MSMT LEADCHNL RA IMPEDANCE VALUE: 430 Ohm
MDC IDC MSMT LEADCHNL RA PACING THRESHOLD PULSEWIDTH: 0.5 ms
MDC IDC MSMT LEADCHNL RA SENSING INTR AMPL: 2.9 mV
MDC IDC MSMT LEADCHNL RV PACING THRESHOLD AMPLITUDE: 0.75 V
MDC IDC MSMT LEADCHNL RV SENSING INTR AMPL: 12 mV
MDC IDC SET LEADCHNL RA PACING AMPLITUDE: 1.5 V
MDC IDC SET LEADCHNL RV SENSING SENSITIVITY: 0.5 mV
MDC IDC STAT BRADY AP VP PERCENT: 1 %
MDC IDC STAT BRADY AP VS PERCENT: 66 %
MDC IDC STAT BRADY AS VP PERCENT: 1 %
MDC IDC STAT BRADY RA PERCENT PACED: 66 %
Pulse Gen Serial Number: 7003419

## 2018-03-17 NOTE — Progress Notes (Signed)
Pulmonary Rehab  Corey Tyler came for the education class but was not able to exercise. He states that his back "is out." He is complaining of back pain and he is going to the doctor today.

## 2018-03-22 ENCOUNTER — Encounter (HOSPITAL_COMMUNITY)
Admission: RE | Admit: 2018-03-22 | Discharge: 2018-03-22 | Disposition: A | Discharge status: Home / Self Care | Payer: PPO | Source: Ambulatory Visit | Attending: Pulmonary Disease | Admitting: Pulmonary Disease

## 2018-03-22 VITALS — Wt 227.1 lb

## 2018-03-22 DIAGNOSIS — J438 Other emphysema: Secondary | ICD-10-CM | POA: Diagnosis not present

## 2018-03-22 DIAGNOSIS — J439 Emphysema, unspecified: Secondary | ICD-10-CM

## 2018-03-22 NOTE — Progress Notes (Signed)
Daily Session Note  Patient Details  Name: GEORG ANG MRN: [id number] Date of Birth: March 02, 1941 Referring Provider:     Pulmonary Rehab Walk Test from 02/15/2018 in [place]  Referring Provider  Dr. Nelda Marseille      Encounter Date: 03/22/2018  Check In: Session Check In - 03/22/18 1517      Check-In   Location  [facility]    Staff Present  Rodney Langton, RN;Molly DiVincenzo, MS, ACSM RCEP, Exercise Physiologist;Portia [name], RN, BSN    Supervising physician immediately available to respond to emergencies  [facility] immediately available    Physician(s)  Dr. Maylene Roes    Medication changes reported      No    Fall or balance concerns reported     No    Tobacco Cessation  No Change    Warm-up and Cool-down  Performed as group-led instruction    Resistance Training Performed  Yes    VAD Patient?  No      Pain Assessment   Currently in Pain?  No/denies    Multiple Pain Sites  No       Capillary Blood Glucose: No results found for this or any previous visit (from the past 24 hour(s)).  Exercise Prescription Changes - 03/22/18 1500      Response to Exercise   Blood Pressure (Admit)  108/67    Blood Pressure (Exercise)  110/60    Blood Pressure (Exit)  90/60    Heart Rate (Admit)  65 bpm    Heart Rate (Exercise)  91 bpm    Heart Rate (Exit)  82 bpm    Oxygen Saturation (Admit)  88 %    Oxygen Saturation (Exercise)  88 %    Oxygen Saturation (Exit)  88 %    Rating of Perceived Exertion (Exercise)  13    Perceived Dyspnea (Exercise)  2    Duration  Progress to 45 minutes of aerobic exercise without signs/symptoms of physical distress    Intensity  THRR unchanged      Progression   Progression  Continue to progress workloads to maintain intensity without signs/symptoms of physical distress.      Resistance Training   Training Prescription  Yes    Weight  blue bands    Reps  10-15    Time  10 Minutes      Oxygen   Oxygen  Continuous    Liters  3      NuStep   Level  3    Minutes  17      Arm Ergometer   Level  3    Minutes  17      Track   Minutes  17       Social History   Tobacco Use  Smoking Status Former Smoker  . Packs/day: 2.50  . Years: 40.00  . Pack years: 100.00  . Types: Cigarettes  . Last attempt to quit: 11/24/1983  . Years since quitting: 34.3  Smokeless Tobacco Never Used  Tobacco Comment   2 1/2 ppd x 40 years    Goals Met:  Exercise tolerated well No report of cardiac concerns or symptoms Strength training completed today  Goals Unmet:  Not Applicable  Comments: Service time is from 1330 to 1500    Dr. Rush Farmer is Medical Director for Pulmonary Rehab at [facility].

## 2018-03-24 ENCOUNTER — Encounter (HOSPITAL_COMMUNITY)
Admission: RE | Admit: 2018-03-24 | Discharge: 2018-03-24 | Disposition: A | Discharge status: Home / Self Care | Payer: PPO | Source: Ambulatory Visit | Attending: Pulmonary Disease | Admitting: Pulmonary Disease

## 2018-03-24 DIAGNOSIS — J439 Emphysema, unspecified: Secondary | ICD-10-CM

## 2018-03-24 DIAGNOSIS — J438 Other emphysema: Secondary | ICD-10-CM | POA: Diagnosis not present

## 2018-03-24 NOTE — Progress Notes (Signed)
Daily Session Note  Patient Details  Name: Corey Tyler MRN: [id number] Date of Birth: 06-Mar-1941 Referring Provider:     Pulmonary Rehab Walk Test from 02/15/2018 in [place]  Referring Provider  Dr. Nelda Marseille      Encounter Date: 03/24/2018  Check In: Session Check In - 03/24/18 1330      Check-In   Location  [facility]    Staff Present  Rodney Langton, RN;Molly DiVincenzo, MS, ACSM RCEP, Exercise Physiologist;Portia [name], RN, BSN    Supervising physician immediately available to respond to emergencies  [facility] immediately available    Physician(s)  Dr. Reesa Chew    Medication changes reported      No    Fall or balance concerns reported     No    Tobacco Cessation  No Change    Warm-up and Cool-down  Performed as group-led instruction    Resistance Training Performed  Yes    VAD Patient?  No      Pain Assessment   Currently in Pain?  No/denies    Multiple Pain Sites  No       Capillary Blood Glucose: No results found for this or any previous visit (from the past 24 hour(s)).    Social History   Tobacco Use  Smoking Status Former Smoker  . Packs/day: 2.50  . Years: 40.00  . Pack years: 100.00  . Types: Cigarettes  . Last attempt to quit: 11/24/1983  . Years since quitting: 34.3  Smokeless Tobacco Never Used  Tobacco Comment   2 1/2 ppd x 40 years    Goals Met:  Exercise tolerated well No report of cardiac concerns or symptoms Strength training completed today  Goals Unmet:  Not Applicable  Comments: Service time is from 1330 to 1545    Dr. Rush Farmer is Medical Director for Pulmonary Rehab at [facility].

## 2018-03-29 ENCOUNTER — Encounter (HOSPITAL_COMMUNITY)
Admission: RE | Admit: 2018-03-29 | Discharge: 2018-03-29 | Disposition: A | Discharge status: Home / Self Care | Payer: PPO | Source: Ambulatory Visit | Attending: Pulmonary Disease | Admitting: Pulmonary Disease

## 2018-03-29 DIAGNOSIS — J439 Emphysema, unspecified: Secondary | ICD-10-CM

## 2018-03-29 DIAGNOSIS — J438 Other emphysema: Secondary | ICD-10-CM | POA: Diagnosis not present

## 2018-03-29 NOTE — Progress Notes (Signed)
Daily Session Note  Patient Details  Name: Corey Tyler MRN: [id number] Date of Birth: 1941/03/09 Referring Provider:     Pulmonary Rehab Walk Test from 02/15/2018 in [place]  Referring Provider  Dr. Nelda Marseille      Encounter Date: 03/29/2018  Check In: Session Check In - 03/29/18 1330      Check-In   Location  [facility]    Staff Present  Rodney Langton, RN;Molly DiVincenzo, MS, ACSM RCEP, Exercise Physiologist;Portia [name], RN, BSN;Carlette Carlton, RN, Deland Pretty, MS, ACSM CEP, Exercise Physiologist    Supervising physician immediately available to respond to emergencies  [facility] immediately available    Physician(s)  Dr. Reesa Chew    Medication changes reported      No    Fall or balance concerns reported     No    Tobacco Cessation  No Change    Warm-up and Cool-down  Performed as group-led instruction    Resistance Training Performed  Yes    VAD Patient?  No      Pain Assessment   Currently in Pain?  No/denies    Multiple Pain Sites  No       Capillary Blood Glucose: No results found for this or any previous visit (from the past 24 hour(s)).    Social History   Tobacco Use  Smoking Status Former Smoker  . Packs/day: 2.50  . Years: 40.00  . Pack years: 100.00  . Types: Cigarettes  . Last attempt to quit: 11/24/1983  . Years since quitting: 34.3  Smokeless Tobacco Never Used  Tobacco Comment   2 1/2 ppd x 40 years    Goals Met:  Exercise tolerated well No report of cardiac concerns or symptoms Strength training completed today  Goals Unmet:  Not Applicable  Comments: Service time is from 1330 to 1510    Dr. Rush Farmer is Medical Director for Pulmonary Rehab at [facility].

## 2018-03-29 NOTE — Progress Notes (Signed)
While exercising at Pulmonary Rehab, Corey Tyler consistently uses 3 liters of continuous flow oxygen. This is provided by [facility]. At home, the patient currently uses a POC for their home oxygen system. While exercising at home, the patient was instructed to use 4 liters. In Pulmonary Rehab today, Corey Tyler walked the track utilizing their own home oxygen system. They used 4 liters pulsed which maintained their oxygen saturation above 85%.

## 2018-03-31 ENCOUNTER — Encounter (HOSPITAL_COMMUNITY)
Admission: RE | Admit: 2018-03-31 | Discharge: 2018-03-31 | Disposition: A | Discharge status: Home / Self Care | Payer: PPO | Source: Ambulatory Visit | Attending: Pulmonary Disease | Admitting: Pulmonary Disease

## 2018-03-31 DIAGNOSIS — J438 Other emphysema: Secondary | ICD-10-CM | POA: Diagnosis not present

## 2018-03-31 DIAGNOSIS — J439 Emphysema, unspecified: Secondary | ICD-10-CM

## 2018-03-31 NOTE — Progress Notes (Signed)
Corey Tyler 77 y.o. male   DOB: 1941/07/14 MRN: [id number]          Nutrition 1. Pulmonary emphysema, unspecified emphysema type ([place])    Past Medical History:  Diagnosis Date  . A-fib ([place])    a. Noted on 12/2012 interrogation, placed on Apixaban and ultimately stopped NOACs 2/2 personal decision 8/14.  Marland Kitchen AICD (automatic cardioverter/defibrillator) present    Cardiac defibrillator -dual  [facility]  . Arthritis   . Atrial tachycardia-non sustained    a. Noted on 03/2012 interrogation.  . Chronic systolic heart failure ([facility])    EF down to 20 to 25% per echo November 2012  . COPD (chronic obstructive pulmonary disease) ([place])   . Hyperlipidemia   . Hypertension   . ICD (implantable cardiac defibrillator) in place   . NICM (nonischemic cardiomyopathy) ([place])    a. Normal cors 2000. b. Minimal plaque 2013.  . Old myocardial infarction    a. ?Silent MI. b. Large fixed inferolateral defect c/w with prior infarct, no ischemia. c. Cath in 2000/2013 with only minimal CAD.  Marland Kitchen Presence of permanent cardiac pacemaker   . Pulmonary nodule, right    Last scan in 2010 showing stability; felt to be benign.  Marland Kitchen PVC's (premature ventricular contractions)   . Ventricular tachycardia, polymorphic ([place])    Rx via ICD 12/14   Meds reviewed.   Ht: Ht Readings from Last 1 Encounters:  02/11/18 6\' 1"  (1.854 m)     Wt:  Wt Readings from Last 3 Encounters:  03/22/18 227 lb 1.2 oz (103 kg)  03/08/18 227 lb 4.7 oz (103.1 kg)  02/22/18 223 lb 15.8 oz (101.6 kg)    BMI: 29.99    Current tobacco use? No  Labs: reviewed   Note Spoke with pt. There are some ways the pt can make his eating habits healthier. Pt's Rate Your Plate results reviewed with pt. Age-appropriate nutrition recommendations discussed. Pt expressed understanding of the information reviewed via feedback method.    Nutrition Diagnosis ? Food-and nutrition-related knowledge deficit related to lack of exposure to information as  related to diagnosis of pulmonary disease  Nutrition Intervention ? Pt's individual nutrition plan and goals reviewed with pt. ? Benefits of adopting healthy eating habits discussed when pt's Rate Your Plate reviewed.  Goal(s) 1. Pt to increase fruit and vegetable consumption by aiming for 1 serving of fruit and/or vegetable at each meal. 2. Pt to consider eating a meatless meal at least 2 times/week.  Plan:  Pt to attend Pulmonary Nutrition class Will provide client-centered nutrition education as part of interdisciplinary care.   Monitor and evaluate progress toward nutrition goal with team.  Monitor and Evaluate progress toward nutrition goal with team.   Derek Mound, M.Ed, RD, LDN, CDE 03/31/2018 3:41 PM

## 2018-03-31 NOTE — Progress Notes (Signed)
Pulmonary Individual Treatment Plan  Patient Details  Name: HERVE HAUG MRN: [id number] Date of Birth: 05-May-1941 Referring Provider:     Pulmonary Rehab Walk Test from 02/15/2018 in [facility]  Referring Provider  Dr. Nelda Marseille      Initial Encounter Date:    Pulmonary Rehab Walk Test from 02/15/2018 in [facility]  Date  02/17/18  Referring Provider  Dr. Nelda Marseille      Visit Diagnosis: Pulmonary emphysema, unspecified emphysema type ([place])  Patient's Home Medications on Admission:   Current Outpatient Medications:  .  Albuterol Sulfate (PROAIR RESPICLICK) 659 (90 Base) MCG/ACT AEPB, Inhale 2 puffs into the lungs every 6 (six) hours as needed (shortness of breath/ wheezing)., Disp: , Rfl:  .  amiodarone (PACERONE) 200 MG tablet, Take 2 tablets (400 mg total) by mouth daily. Take 472m by mouth daily for 1 month, then resume 2064mby mouth daily dose., Disp: 60 tablet, Rfl: 0 .  Carboxymethylcellul-Glycerin (LUBRICATING EYE DROPS OP), Apply 1 drop to eye daily as needed (dry eyes)., Disp: , Rfl:  .  cholecalciferol (VITAMIN D) 1000 UNITS tablet, Take 1,000 Units by mouth daily., Disp: , Rfl:  .  ELIQUIS 5 MG TABS tablet, TAKE 1 TABLET BY MOUTH TWICE A DAY, Disp: 60 tablet, Rfl: 5 .  esomeprazole (NEXIUM) 40 MG capsule, Take 1 capsule (40 mg total) by mouth daily., Disp: 90 capsule, Rfl: 3 .  finasteride (PROSCAR) 5 MG tablet, Take 5 mg by mouth daily. , Disp: , Rfl:  .  fluticasone (FLONASE) 50 MCG/ACT nasal spray, Place 1 spray into both nostrils daily. , Disp: , Rfl:  .  Fluticasone-Salmeterol (ADVAIR DISKUS) 250-50 MCG/DOSE AEPB, Inhale 1 puff into the lungs 2 (two) times daily., Disp: , Rfl:  .  levothyroxine (SYNTHROID, LEVOTHROID) 125 MCG tablet, Take 125 mcg by mouth daily., Disp: , Rfl:  .  Melatonin 10 MG TABS, Take 10 mg by mouth at bedtime., Disp: , Rfl:  .  metoprolol tartrate (LOPRESSOR) 25 MG tablet, Take 1 tablet  (25 mg total) by mouth 2 (two) times daily., Disp: 60 tablet, Rfl: 0 .  Multiple Vitamin (MULTIVITAMIN WITH MINERALS) TABS tablet, Take 1 tablet by mouth daily., Disp: , Rfl:  .  nitroGLYCERIN (NITROSTAT) 0.4 MG SL tablet, Place 0.4 mg under the tongue every 5 (five) minutes as needed for chest pain. , Disp: , Rfl:  .  OXYGEN, Inhale 3 L into the lungs See admin instructions. Use oxygen whenever resting, Disp: , Rfl:  .  POTASSIUM PO, Take 1 tablet by mouth at bedtime., Disp: , Rfl:  .  promethazine (PHENERGAN) 25 MG tablet, Take 25 mg by mouth daily as needed for nausea or vomiting. , Disp: , Rfl:  .  Psyllium (METAMUCIL PO), Take 1 Dose by mouth daily. Mix in liquid and drink , Disp: , Rfl:  .  ranitidine (ZANTAC) 150 MG tablet, Take 1 tablet (150 mg total) by mouth at bedtime., Disp: 90 tablet, Rfl: 3 .  ranolazine (RANEXA) 1000 MG SR tablet, Take 1 tablet (1,000 mg total) by mouth 2 (two) times daily., Disp: 180 tablet, Rfl: 3 .  sacubitril-valsartan (ENTRESTO) 24-26 MG, Take 1 tablet by mouth 2 (two) times daily., Disp: 60 tablet, Rfl: 11 .  Tamsulosin HCl (FLOMAX) 0.4 MG CAPS, Take 0.4 mg by mouth at bedtime. , Disp: , Rfl:  .  torsemide (DEMADEX) 20 MG tablet, Take 1 tablet (20 mg total) by mouth daily. (  Patient not taking: Reported on 02/11/2018), Disp: 30 tablet, Rfl: 3 .  vitamin B-12 (CYANOCOBALAMIN) 1000 MCG tablet, Take 1,000 mcg by mouth daily., Disp: , Rfl:   Past Medical History: Past Medical History:  Diagnosis Date  . A-fib ([place])    a. Noted on 12/2012 interrogation, placed on Apixaban and ultimately stopped NOACs 2/2 personal decision 8/14.  Marland Kitchen AICD (automatic cardioverter/defibrillator) present    Cardiac defibrillator -dual  [facility]  . Arthritis   . Atrial tachycardia-non sustained    a. Noted on 03/2012 interrogation.  . Chronic systolic heart failure (H[facility])    EF down to 20 to 25% per echo November 2012  . COPD (chronic obstructive pulmonary disease) ([place])   .  Hyperlipidemia   . Hypertension   . ICD (implantable cardiac defibrillator) in place   . NICM (nonischemic cardiomyopathy) ([place])    a. Normal cors 2000. b. Minimal plaque 2013.  . Old myocardial infarction    a. ?Silent MI. b. Large fixed inferolateral defect c/w with prior infarct, no ischemia. c. Cath in 2000/2013 with only minimal CAD.  Marland Kitchen Presence of permanent cardiac pacemaker   . Pulmonary nodule, right    Last scan in 2010 showing stability; felt to be benign.  Marland Kitchen PVC's (premature ventricular contractions)   . Ventricular tachycardia, polymorphic ([place])    Rx via ICD 12/14    Tobacco Use: Social History   Tobacco Use  Smoking Status Former Smoker  . Packs/day: 2.50  . Years: 40.00  . Pack years: 100.00  . Types: Cigarettes  . Last attempt to quit: 11/24/1983  . Years since quitting: 34.3  Smokeless Tobacco Never Used  Tobacco Comment   2 1/2 ppd x 40 years    Labs: Recent Review Flowsheet Data    Labs for ITP Cardiac and Pulmonary Rehab Latest Ref Rng & Units 08/10/2008 12/18/2011 12/18/2011 03/27/2013 04/17/2014   Cholestrol 0 - 200 mg/dL - - - - 113   LDLCALC 0 - 99 mg/dL - - - - 52   HDL >39.00 mg/dL - - - - 49.80   Trlycerides 0.0 - 149.0 mg/dL - - - - 58.0   Hemoglobin A1c <5.7 % - - - 6.0(H) -   PHART 7.350 - 7.450 - - 7.395 - -   PCO2ART 35.0 - 45.0 mmHg - - 38.1 - -   HCO3 20.0 - 24.0 mEq/L - 23.8 23.4 - -   TCO2 0 - 100 mmol/L '25 25 25 ' - -   ACIDBASEDEF 0.0 - 2.0 mmol/L - 2.0 1.0 - -   O2SAT % - 57.0 93.0 - -      Capillary Blood Glucose: Lab Results  Component Value Date   GLUCAP 109 (H) 07/18/2015     Pulmonary Assessment Scores: Pulmonary Assessment Scores    Row Name 02/17/18 1603 02/23/18 1155       ADL UCSD   ADL Phase  -  Entry    SOB Score total  -  87      CAT Score   CAT Score  -  22 Entry      mMRC Score   mMRC Score  3  -       Pulmonary Function Assessment: Pulmonary Function Assessment - 02/11/18 1431      Breath    Bilateral Breath Sounds  Clear;Decreased    Shortness of Breath  Limiting activity;Yes       Exercise Target Goals:    Exercise Program Goal: Individual exercise  prescription set using results from initial 6 min walk test and THRR while considering  patient's activity barriers and safety.    Exercise Prescription Goal: Initial exercise prescription builds to 30-45 minutes a day of aerobic activity, 2-3 days per week.  Home exercise guidelines will be given to patient during program as part of exercise prescription that the participant will acknowledge.  Activity Barriers & Risk Stratification: Activity Barriers & Cardiac Risk Stratification - 02/11/18 1408      Activity Barriers & Cardiac Risk Stratification   Activity Barriers  Shortness of Breath;Muscular Weakness;Deconditioning;Balance Concerns;History of Falls;Decreased Ventricular Function       6 Minute Walk: 6 Minute Walk    Row Name 02/17/18 1557         6 Minute Walk   Phase  Initial     Distance  1200 feet     Walk Time  6 minutes     # of Rest Breaks  0     MPH  2.27     METS  2.68     RPE  12     Perceived Dyspnea   1     Symptoms  No     Resting HR  66 bpm     Resting BP  108/67     Resting Oxygen Saturation   93 %     Exercise Oxygen Saturation  during 6 min walk  86 %     Max Ex. HR  87 bpm     Max Ex. BP  130/64       Interval HR   1 Minute HR  76     2 Minute HR  70     3 Minute HR  71     4 Minute HR  80     5 Minute HR  87     6 Minute HR  87     2 Minute Post HR  67     Interval Heart Rate?  Yes       Interval Oxygen   Interval Oxygen?  Yes     Baseline Oxygen Saturation %  93 %     1 Minute Oxygen Saturation %  88 %     1 Minute Liters of Oxygen  3 L     2 Minute Oxygen Saturation %  86 %     2 Minute Liters of Oxygen  4 L     3 Minute Oxygen Saturation %  86 %     3 Minute Liters of Oxygen  4 L     4 Minute Oxygen Saturation %  91 %     4 Minute Liters of Oxygen  4 L     5  Minute Oxygen Saturation %  87 %     5 Minute Liters of Oxygen  6 L     6 Minute Oxygen Saturation %  88 %     6 Minute Liters of Oxygen  6 L     2 Minute Post Oxygen Saturation %  95 %     2 Minute Post Liters of Oxygen  6 L        Oxygen Initial Assessment: Oxygen Initial Assessment - 02/17/18 1603      Initial 6 min Walk   Oxygen Used  Continuous;E-Tanks    Liters per minute  6      Program Oxygen Prescription   Program Oxygen Prescription  Continuous;E-Tanks    Liters per minute  6       Oxygen Re-Evaluation: Oxygen Re-Evaluation    Row Name 02/25/18 1554 03/29/18 0751           Program Oxygen Prescription   Program Oxygen Prescription  Continuous;E-Tanks  Continuous;E-Tanks      Liters per minute  - 3-6  3        Home Oxygen   Home Oxygen Device  Portable Concentrator;Home Concentrator;E-Tanks  Portable Concentrator;Home Concentrator;E-Tanks      Sleep Oxygen Prescription  Continuous  -      Liters per minute  3  3      Home Exercise Oxygen Prescription  Pulsed  Pulsed      Liters per minute  4  4      Home at Rest Exercise Oxygen Prescription  Continuous  Continuous      Liters per minute  3  3      Compliance with Home Oxygen Use  No  Yes        Goals/Expected Outcomes   Short Term Goals  To learn and exhibit compliance with exercise, home and travel O2 prescription;To learn and understand importance of monitoring SPO2 with pulse oximeter and demonstrate accurate use of the pulse oximeter.;To learn and understand importance of maintaining oxygen saturations>88%;To learn and demonstrate proper pursed lip breathing techniques or other breathing techniques.  To learn and exhibit compliance with exercise, home and travel O2 prescription;To learn and understand importance of monitoring SPO2 with pulse oximeter and demonstrate accurate use of the pulse oximeter.;To learn and understand importance of maintaining oxygen saturations>88%;To learn and demonstrate proper  pursed lip breathing techniques or other breathing techniques.      Long  Term Goals  Exhibits compliance with exercise, home and travel O2 prescription;Verbalizes importance of monitoring SPO2 with pulse oximeter and return demonstration;Maintenance of O2 saturations>88%;Exhibits proper breathing techniques, such as pursed lip breathing or other method taught during program session;Compliance with respiratory medication  Exhibits compliance with exercise, home and travel O2 prescription;Verbalizes importance of monitoring SPO2 with pulse oximeter and return demonstration;Maintenance of O2 saturations>88%;Exhibits proper breathing techniques, such as pursed lip breathing or other method taught during program session;Compliance with respiratory medication      Goals/Expected Outcomes  compliance  compliance         Oxygen Discharge (Final Oxygen Re-Evaluation): Oxygen Re-Evaluation - 03/29/18 0751      Program Oxygen Prescription   Program Oxygen Prescription  Continuous;E-Tanks    Liters per minute  3      Home Oxygen   Home Oxygen Device  Portable Concentrator;Home Concentrator;E-Tanks    Liters per minute  3    Home Exercise Oxygen Prescription  Pulsed    Liters per minute  4    Home at Rest Exercise Oxygen Prescription  Continuous    Liters per minute  3    Compliance with Home Oxygen Use  Yes      Goals/Expected Outcomes   Short Term Goals  To learn and exhibit compliance with exercise, home and travel O2 prescription;To learn and understand importance of monitoring SPO2 with pulse oximeter and demonstrate accurate use of the pulse oximeter.;To learn and understand importance of maintaining oxygen saturations>88%;To learn and demonstrate proper pursed lip breathing techniques or other breathing techniques.    Long  Term Goals  Exhibits compliance with exercise, home and travel O2 prescription;Verbalizes importance of monitoring SPO2 with pulse oximeter and return  demonstration;Maintenance of O2 saturations>88%;Exhibits proper breathing techniques, such as pursed lip breathing or  other method taught during program session;Compliance with respiratory medication    Goals/Expected Outcomes  compliance       Initial Exercise Prescription: Initial Exercise Prescription - 02/17/18 1600      Date of Initial Exercise RX and Referring Provider   Date  02/17/18    Referring Provider  Dr. Nelda Marseille      Oxygen   Oxygen  Continuous    Liters  6      NuStep   Level  2    SPM  80    Minutes  17    METs  1.5      Arm Ergometer   Level  2    Watts  10    Minutes  17      Track   Laps  5    Minutes  17    METs  1      Prescription Details   Frequency (times per week)  2    Duration  Progress to 45 minutes of aerobic exercise without signs/symptoms of physical distress      Intensity   THRR 40-80% of Max Heartrate  57-114    Ratings of Perceived Exertion  11-13    Perceived Dyspnea  0-4      Progression   Progression  Continue progressive overload as per policy without signs/symptoms or physical distress.      Resistance Training   Training Prescription  Yes    Weight  blue bands    Reps  10-15       Perform Capillary Blood Glucose checks as needed.  Exercise Prescription Changes: Exercise Prescription Changes    Row Name 02/22/18 1535 03/08/18 1555 03/22/18 1500         Response to Exercise   Blood Pressure (Admit)  90/42  106/60  108/67     Blood Pressure (Exercise)  98/52  108/72  110/60     Blood Pressure (Exit)  112/60  106/62  90/60     Heart Rate (Admit)  64 bpm  69 bpm  65 bpm     Heart Rate (Exercise)  89 bpm  85 bpm  91 bpm     Heart Rate (Exit)  63 bpm  65 bpm  82 bpm     Oxygen Saturation (Admit)  93 %  91 %  88 %     Oxygen Saturation (Exercise)  89 %  88 %  88 %     Oxygen Saturation (Exit)  95 %  89 %  88 %     Rating of Perceived Exertion (Exercise)  '13  13  13     ' Perceived Dyspnea (Exercise)  '2  2  2      ' Duration  Progress to 45 minutes of aerobic exercise without signs/symptoms of physical distress 40-80% HRR  Progress to 45 minutes of aerobic exercise without signs/symptoms of physical distress 40-80% HRR  Progress to 45 minutes of aerobic exercise without signs/symptoms of physical distress     Intensity  THRR unchanged  THRR unchanged  THRR unchanged       Progression   Progression  Continue to progress workloads to maintain intensity without signs/symptoms of physical distress.  Continue to progress workloads to maintain intensity without signs/symptoms of physical distress.  Continue to progress workloads to maintain intensity without signs/symptoms of physical distress.       Resistance Training   Training Prescription  Yes  Yes  Yes     Weight  blue  bands  blue bands  blue bands     Reps  10-15  10-15  10-15     Time  -  -  10 Minutes       Oxygen   Oxygen  Continuous  Continuous  Continuous     Liters  '6  6  3       ' NuStep   Level  '2  2  3     ' SPM  80  80  -     Minutes  '17  17  17     ' METs  2  1.9  -       Arm Ergometer   Level  '2  3  3     ' Watts  10  10  -     Minutes  '17  17  17       ' Track   Laps  13  15  -     Minutes  '17  17  17     ' METs  1  -  -        Exercise Comments:   Exercise Goals and Review: Exercise Goals    Row Name 02/11/18 1411             Exercise Goals   Increase Physical Activity  Yes       Intervention  Provide advice, education, support and counseling about physical activity/exercise needs.;Develop an individualized exercise prescription for aerobic and resistive training based on initial evaluation findings, risk stratification, comorbidities and participant's personal goals.       Expected Outcomes  Long Term: Add in home exercise to make exercise part of routine and to increase amount of physical activity.;Long Term: Exercising regularly at least 3-5 days a week.       Increase Strength and Stamina  Yes       Intervention  Develop  an individualized exercise prescription for aerobic and resistive training based on initial evaluation findings, risk stratification, comorbidities and participant's personal goals.;Provide advice, education, support and counseling about physical activity/exercise needs.       Expected Outcomes  Short Term: Perform resistance training exercises routinely during rehab and add in resistance training at home;Long Term: Improve cardiorespiratory fitness, muscular endurance and strength as measured by increased METs and functional capacity (6MWT);Short Term: Increase workloads from initial exercise prescription for resistance, speed, and METs.       Able to understand and use rate of perceived exertion (RPE) scale  Yes       Intervention  Provide education and explanation on how to use RPE scale       Expected Outcomes  Short Term: Able to use RPE daily in rehab to express subjective intensity level;Long Term:  Able to use RPE to guide intensity level when exercising independently       Able to understand and use Dyspnea scale  Yes       Intervention  Provide education and explanation on how to use Dyspnea scale       Expected Outcomes  Short Term: Able to use Dyspnea scale daily in rehab to express subjective sense of shortness of breath during exertion;Long Term: Able to use Dyspnea scale to guide intensity level when exercising independently       Knowledge and understanding of Target Heart Rate Range (THRR)  Yes       Intervention  Provide education and explanation of THRR including how the numbers were predicted and where they are located  for reference       Expected Outcomes  Short Term: Able to use daily as guideline for intensity in rehab;Short Term: Able to state/look up THRR;Long Term: Able to use THRR to govern intensity when exercising independently       Understanding of Exercise Prescription  Yes       Intervention  Provide education, explanation, and written materials on patient's individual  exercise prescription       Expected Outcomes  Short Term: Able to explain program exercise prescription;Long Term: Able to explain home exercise prescription to exercise independently          Exercise Goals Re-Evaluation : Exercise Goals Re-Evaluation    Row Name 02/25/18 1555 03/29/18 0752           Exercise Goal Re-Evaluation   Exercise Goals Review  Able to understand and use Dyspnea scale;Increase Strength and Stamina;Increase Physical Activity;Able to understand and use rate of perceived exertion (RPE) scale;Knowledge and understanding of Target Heart Rate Range (THRR);Understanding of Exercise Prescription  Able to understand and use Dyspnea scale;Increase Strength and Stamina;Increase Physical Activity;Able to understand and use rate of perceived exertion (RPE) scale;Knowledge and understanding of Target Heart Rate Range (THRR);Understanding of Exercise Prescription      Comments  Patient has only attended 2 rehab sessions. Will cont. to monitor and progress as able.   Patient is able to walk up to 15 laps (200 ft) in 15 minutes. Patient is limited by deconditioning and back pain. Will cont. to monitor and progress as able.       Expected Outcomes  Through exercise at rehab and at home, patient will increase strength and stamina. The patient will also gain the confidence and knowledge to maintain an exercise regime at home.   Through exercise at rehab and at home, patient will increase strength and stamina. The patient will also gain the confidence and knowledge to maintain an exercise regime at home.          Discharge Exercise Prescription (Final Exercise Prescription Changes): Exercise Prescription Changes - 03/22/18 1500      Response to Exercise   Blood Pressure (Admit)  108/67    Blood Pressure (Exercise)  110/60    Blood Pressure (Exit)  90/60    Heart Rate (Admit)  65 bpm    Heart Rate (Exercise)  91 bpm    Heart Rate (Exit)  82 bpm    Oxygen Saturation (Admit)  88 %     Oxygen Saturation (Exercise)  88 %    Oxygen Saturation (Exit)  88 %    Rating of Perceived Exertion (Exercise)  13    Perceived Dyspnea (Exercise)  2    Duration  Progress to 45 minutes of aerobic exercise without signs/symptoms of physical distress    Intensity  THRR unchanged      Progression   Progression  Continue to progress workloads to maintain intensity without signs/symptoms of physical distress.      Resistance Training   Training Prescription  Yes    Weight  blue bands    Reps  10-15    Time  10 Minutes      Oxygen   Oxygen  Continuous    Liters  3      NuStep   Level  3    Minutes  17      Arm Ergometer   Level  3    Minutes  17      Track   Minutes  17       Nutrition:  Target Goals: Understanding of nutrition guidelines, daily intake of sodium <1533m, cholesterol <2020m calories 30% from fat and 7% or less from saturated fats, daily to have 5 or more servings of fruits and vegetables.  Biometrics: Pre Biometrics - 02/11/18 1457      Pre Biometrics   Grip Strength  35 kg        Nutrition Therapy Plan and Nutrition Goals: Nutrition Therapy & Goals - 03/09/18 1051      Nutrition Therapy   Diet  Heart HealthyTo be determined      Personal Nutrition Goals   Nutrition Goal  To be determined      Intervention Plan   Intervention  Prescribe, educate and counsel regarding individualized specific dietary modifications aiming towards targeted core components such as weight, hypertension, lipid management, diabetes, heart failure and other comorbidities.    Expected Outcomes  Short Term Goal: Understand basic principles of dietary content, such as calories, fat, sodium, cholesterol and nutrients.;Long Term Goal: Adherence to prescribed nutrition plan.       Nutrition Assessments: Nutrition Assessments - 03/09/18 1052      Rate Your Plate Scores   Pre Score  50       Nutrition Goals Re-Evaluation:   Nutrition Goals Discharge (Final Nutrition  Goals Re-Evaluation):   Psychosocial: Target Goals: Acknowledge presence or absence of significant depression and/or stress, maximize coping skills, provide positive support system. Participant is able to verbalize types and ability to use techniques and skills needed for reducing stress and depression.  Initial Review & Psychosocial Screening: Initial Psych Review & Screening - 02/11/18 1433      Initial Review   Current issues with  Current Sleep Concerns      Family Dynamics   Good Support System?  Yes    Comments  children, wife, and siblings      Barriers   Psychosocial barriers to participate in program  There are no identifiable barriers or psychosocial needs.      Screening Interventions   Interventions  Encouraged to exercise       Quality of Life Scores:  Scores of 19 and below usually indicate a poorer quality of life in these areas.  A difference of  2-3 points is a clinically meaningful difference.  A difference of 2-3 points in the total score of the Quality of Life Index has been associated with significant improvement in overall quality of life, self-image, physical symptoms, and general health in studies assessing change in quality of life.   PHQ-9: Recent Review Flowsheet Data    [facility] 02/11/2018 03/09/2016   Decreased Interest 0 0   Down, Depressed, Hopeless 0 0   PHQ - 2 Score 0 0     Interpretation of Total Score  Total Score Depression Severity:  1-4 = Minimal depression, 5-9 = Mild depression, 10-14 = Moderate depression, 15-19 = Moderately severe depression, 20-27 = Severe depression   Psychosocial Evaluation and Intervention: Psychosocial Evaluation - 02/11/18 1447      Psychosocial Evaluation & Interventions   Interventions  Encouraged to exercise with the program and follow exercise prescription    Expected Outcomes  patient will remain free from psychosocial barriers to participation    Continue Psychosocial Services   No  Follow up required       Psychosocial Re-Evaluation: Psychosocial Re-Evaluation    [place] 02/28/18 1642 03/28/18 1545  Psychosocial Re-Evaluation   Current issues with  Current Sleep Concerns  Current Sleep Concerns      Comments  -  patient states his sleep concerns are not related to any psychosocial issues      Expected Outcomes  patient will remain free from psychosocial barriers to participation in pulmonary rehab  patient will remain free from psychosocial barriers to participation in pulmonary rehab      Interventions  Encouraged to attend Pulmonary Rehabilitation for the exercise  Encouraged to attend Pulmonary Rehabilitation for the exercise      Continue Psychosocial Services   Follow up required by staff  Follow up required by staff         Psychosocial Discharge (Final Psychosocial Re-Evaluation): Psychosocial Re-Evaluation - 03/28/18 1545      Psychosocial Re-Evaluation   Current issues with  Current Sleep Concerns    Comments  patient states his sleep concerns are not related to any psychosocial issues    Expected Outcomes  patient will remain free from psychosocial barriers to participation in pulmonary rehab    Interventions  Encouraged to attend Pulmonary Rehabilitation for the exercise    Continue Psychosocial Services   Follow up required by staff       Education: Education Goals: Education classes will be provided on a weekly basis, covering required topics. Participant will state understanding/return demonstration of topics presented.  Learning Barriers/Preferences: Learning Barriers/Preferences - 02/11/18 1431      Learning Barriers/Preferences   Learning Barriers  None    Learning Preferences  Group Instruction;Individual Instruction;Skilled Demonstration;Verbal Instruction       Education Topics: Risk Factor Reduction:  -Group instruction that is supported by a PowerPoint presentation. Instructor discusses the definition of a risk  factor, different risk factors for pulmonary disease, and how the heart and lungs work together.     Nutrition for Pulmonary Patient:  -Group instruction provided by PowerPoint slides, verbal discussion, and written materials to support subject matter. The instructor gives an explanation and review of healthy diet recommendations, which includes a discussion on weight management, recommendations for fruit and vegetable consumption, as well as protein, fluid, caffeine, fiber, sodium, sugar, and alcohol. Tips for eating when patients are short of breath are discussed.   Pursed Lip Breathing:  -Group instruction that is supported by demonstration and informational handouts. Instructor discusses the benefits of pursed lip and diaphragmatic breathing and detailed demonstration on how to preform both.     Oxygen Safety:  -Group instruction provided by PowerPoint, verbal discussion, and written material to support subject matter. There is an overview of "What is Oxygen" and "Why do we need it".  Instructor also reviews how to create a safe environment for oxygen use, the importance of using oxygen as prescribed, and the risks of noncompliance. There is a brief discussion on traveling with oxygen and resources the patient may utilize.   PULMONARY REHAB OTHER RESPIRATORY from 03/24/2018 in [place]  Date  03/10/18  Educator  Truddie Crumble  Instruction Review Code  2- Demonstrated Understanding      Oxygen Equipment:  -Group instruction provided by [facility] Staff utilizing handouts, written materials, and equipment demonstrations.   PULMONARY REHAB OTHER RESPIRATORY from 03/24/2018 in [place]  Date  03/24/18  Educator  george with [employer]  Instruction Review Code  1- Verbalizes Understanding      Signs and Symptoms:  -Group instruction provided by written material and verbal discussion to support subject  matter. Warning signs and  symptoms of infection, stroke, and heart attack are reviewed and when to call the physician/911 reinforced. Tips for preventing the spread of infection discussed.   Advanced Directives:  -Group instruction provided by verbal instruction and written material to support subject matter. Instructor reviews Advanced Directive laws and proper instruction for filling out document.   Pulmonary Video:  -Group video education that reviews the importance of medication and oxygen compliance, exercise, good nutrition, pulmonary hygiene, and pursed lip and diaphragmatic breathing for the pulmonary patient.   Exercise for the Pulmonary Patient:  -Group instruction that is supported by a PowerPoint presentation. Instructor discusses benefits of exercise, core components of exercise, frequency, duration, and intensity of an exercise routine, importance of utilizing pulse oximetry during exercise, safety while exercising, and options of places to exercise outside of rehab.     Pulmonary Medications:  -Verbally interactive group education provided by instructor with focus on inhaled medications and proper administration.   Anatomy and Physiology of the Respiratory System and Intimacy:  -Group instruction provided by PowerPoint, verbal discussion, and written material to support subject matter. Instructor reviews respiratory cycle and anatomical components of the respiratory system and their functions. Instructor also reviews differences in obstructive and restrictive respiratory diseases with examples of each. Intimacy, Sex, and Sexuality differences are reviewed with a discussion on how relationships can change when diagnosed with pulmonary disease. Common sexual concerns are reviewed.   MD DAY -A group question and answer session with a medical doctor that allows participants to ask questions that relate to their pulmonary disease state.   PULMONARY REHAB OTHER RESPIRATORY from 03/24/2018 in [place]  Date  03/03/18  Educator  yacoub  Instruction Review Code  2- Demonstrated Understanding      OTHER EDUCATION -Group or individual verbal, written, or video instructions that support the educational goals of the pulmonary rehab program.   PULMONARY REHAB OTHER RESPIRATORY from 03/24/2018 in [place]  Date  02/24/18 Rodney Booze a sedentary lifestyle]  Educator  EP  Instruction Review Code  1- Verbalizes Understanding      Holiday Eating Survival Tips:  -Group instruction provided by PowerPoint slides, verbal discussion, and written materials to support subject matter. The instructor gives patients tips, tricks, and techniques to help them not only survive but enjoy the holidays despite the onslaught of food that accompanies the holidays.   Knowledge Questionnaire Score: Knowledge Questionnaire Score - 02/23/18 1155      Knowledge Questionnaire Score   Pre Score  16/18       Core Components/Risk Factors/Patient Goals at Admission: Personal Goals and Risk Factors at Admission - 02/11/18 1432      Core Components/Risk Factors/Patient Goals on Admission   Improve shortness of breath with ADL's  Yes    Intervention  Provide education, individualized exercise plan and daily activity instruction to help decrease symptoms of SOB with activities of daily living.    Expected Outcomes  Short Term: Improve cardiorespiratory fitness to achieve a reduction of symptoms when performing ADLs;Long Term: Be able to perform more ADLs without symptoms or delay the onset of symptoms    Heart Failure  Yes    Intervention  Provide a combined exercise and nutrition program that is supplemented with education, support and counseling about heart failure. Directed toward relieving symptoms such as shortness of breath, decreased exercise tolerance, and extremity edema.    Expected Outcomes  Improve functional capacity of life;Short  term: Attendance in  program 2-3 days a week with increased exercise capacity. Reported lower sodium intake. Reported increased fruit and vegetable intake. Reports medication compliance.;Short term: Daily weights obtained and reported for increase. Utilizing diuretic protocols set by physician.;Long term: Adoption of self-care skills and reduction of barriers for early signs and symptoms recognition and intervention leading to self-care maintenance.       Core Components/Risk Factors/Patient Goals Review:  Goals and Risk Factor Review    Row Name 02/28/18 1641 03/28/18 1541           Core Components/Risk Factors/Patient Goals Review   Personal Goals Review  Improve shortness of breath with ADL's;Develop more efficient breathing techniques such as purse lipped breathing and diaphragmatic breathing and practicing self-pacing with activity.;Heart Failure  Improve shortness of breath with ADL's;Develop more efficient breathing techniques such as purse lipped breathing and diaphragmatic breathing and practicing self-pacing with activity.;Heart Failure      Review  patient has only attended 2 exercise sessions since admission. too early to evaluate progression towards pulmonary rehab goals. will re-evaluate in 30 days and expect to see progress  patient has done well over the past 30 days in pulmonary rehab. he states he is beginning to see an improvement in his shortness of breath with his exertional activities at home and with exercise. he is learning the PLB and pacing technique and beginning to use them independently with cueing. his heartfailure is well managed with medications. he verbalizes weighing himself daily and understanding when to notify MD with weight gain. he has gained 2.4 kg since admission but there is no evidence of fluid overload or edema. will continue to monitor progression towards pulmonary rehab goals over the next 30 days.      Expected Outcomes  see admission expected outcomes.  see admission expected  outcomes.         Core Components/Risk Factors/Patient Goals at Discharge (Final Review):  Goals and Risk Factor Review - 03/28/18 1541      Core Components/Risk Factors/Patient Goals Review   Personal Goals Review  Improve shortness of breath with ADL's;Develop more efficient breathing techniques such as purse lipped breathing and diaphragmatic breathing and practicing self-pacing with activity.;Heart Failure    Review  patient has done well over the past 30 days in pulmonary rehab. he states he is beginning to see an improvement in his shortness of breath with his exertional activities at home and with exercise. he is learning the PLB and pacing technique and beginning to use them independently with cueing. his heartfailure is well managed with medications. he verbalizes weighing himself daily and understanding when to notify MD with weight gain. he has gained 2.4 kg since admission but there is no evidence of fluid overload or edema. will continue to monitor progression towards pulmonary rehab goals over the next 30 days.    Expected Outcomes  see admission expected outcomes.       ITP Comments: ITP Comments    Row Name 03/28/18 1541           ITP Comments  Dr. Jennet Maduro, Medical Director          Comments: patient has attended 9 pulmonary rehab sessions since admission

## 2018-03-31 NOTE — Progress Notes (Signed)
Daily Session Note  Patient Details  Name: Corey Tyler MRN: [id number] Date of Birth: 10-06-1941 Referring Provider:     Pulmonary Rehab Walk Test from 02/15/2018 in [place]  Referring Provider  Dr. Nelda Marseille      Encounter Date: 03/31/2018  Check In: Session Check In - 03/31/18 1330      Check-In   Location  [facility]    Staff Present  Rodney Langton, RN;Molly DiVincenzo, MS, ACSM RCEP, Exercise Physiologist;Portia [name], RN, BSN;Carlette Wilber Oliphant, RN, BSN    Supervising physician immediately available to respond to emergencies  [facility] immediately available    Physician(s)  Dr. Tawanna Solo    Medication changes reported      No    Fall or balance concerns reported     No    Tobacco Cessation  No Change    Warm-up and Cool-down  Performed as group-led instruction    Resistance Training Performed  Yes    VAD Patient?  No      Pain Assessment   Currently in Pain?  No/denies    Multiple Pain Sites  No       Capillary Blood Glucose: No results found for this or any previous visit (from the past 24 hour(s)).    Social History   Tobacco Use  Smoking Status Former Smoker  . Packs/day: 2.50  . Years: 40.00  . Pack years: 100.00  . Types: Cigarettes  . Last attempt to quit: 11/24/1983  . Years since quitting: 34.3  Smokeless Tobacco Never Used  Tobacco Comment   2 1/2 ppd x 40 years    Goals Met:  Exercise tolerated well No report of cardiac concerns or symptoms Strength training completed today  Goals Unmet:  Not Applicable  Comments: Service time is from 1330 to 1545    Dr. Rush Farmer is Medical Director for Pulmonary Rehab at [facility].

## 2018-04-05 ENCOUNTER — Encounter (HOSPITAL_COMMUNITY): Payer: Self-pay | Admitting: *Deleted

## 2018-04-05 ENCOUNTER — Encounter (HOSPITAL_COMMUNITY)
Admission: RE | Admit: 2018-04-05 | Discharge: 2018-04-05 | Disposition: A | Discharge status: Home / Self Care | Payer: PPO | Source: Ambulatory Visit | Attending: Pulmonary Disease | Admitting: Pulmonary Disease

## 2018-04-05 DIAGNOSIS — J439 Emphysema, unspecified: Secondary | ICD-10-CM

## 2018-04-05 DIAGNOSIS — J438 Other emphysema: Secondary | ICD-10-CM | POA: Diagnosis not present

## 2018-04-05 NOTE — Progress Notes (Signed)
Daily Session Note  Patient Details  Name: Corey Tyler MRN: [id number] Date of Birth: 15-Sep-1941 Referring Provider:     Pulmonary Rehab Walk Test from 02/15/2018 in [place]  Referring Provider  Dr. Nelda Marseille      Encounter Date: 04/05/2018  Check In: Session Check In - 04/05/18 1522      Check-In   Location  [facility]    Staff Present  Maurice Small, RN, BSN;Molly DiVincenzo, MS, ACSM RCEP, Exercise Physiologist;Zalea Pete Ysidro Evert, RN;Portia Payne, RN, BSN    Supervising physician immediately available to respond to emergencies  [facility] immediately available    Physician(s)  Dr.        Myles Gip Blood Glucose: No results found for this or any previous visit (from the past 24 hour(s)).  Exercise Prescription Changes - 04/05/18 1600      Response to Exercise   Blood Pressure (Admit)  120/60    Blood Pressure (Exercise)  100/54    Blood Pressure (Exit)  94/62    Heart Rate (Admit)  66 bpm    Heart Rate (Exercise)  86 bpm    Heart Rate (Exit)  66 bpm    Oxygen Saturation (Admit)  90 %    Oxygen Saturation (Exercise)  85 %    Oxygen Saturation (Exit)  92 %    Rating of Perceived Exertion (Exercise)  13    Perceived Dyspnea (Exercise)  2    Duration  Progress to 45 minutes of aerobic exercise without signs/symptoms of physical distress    Intensity  THRR unchanged      Progression   Progression  Continue to progress workloads to maintain intensity without signs/symptoms of physical distress.      Resistance Training   Training Prescription  Yes    Weight  blue bands    Reps  10-15    Time  10 Minutes      Oxygen   Oxygen  Continuous    Liters  3-4      NuStep   Level  4    Minutes  17    METs  2.3      Arm Ergometer   Level  4    Minutes  17      Track   Laps  15    Minutes  17       Social History   Tobacco Use  Smoking Status Former Smoker  . Packs/day: 2.50  . Years: 40.00  . Pack  years: 100.00  . Types: Cigarettes  . Last attempt to quit: 11/24/1983  . Years since quitting: 34.3  Smokeless Tobacco Never Used  Tobacco Comment   2 1/2 ppd x 40 years    Goals Met:  Exercise tolerated well No report of cardiac concerns or symptoms Strength training completed today  Goals Unmet:  Not Applicable  Comments: Service time is from 1330 to 1500    Dr. Rush Farmer is Medical Director for Pulmonary Rehab at [facility].

## 2018-04-05 NOTE — Addendum Note (Signed)
Encounter addended by: Deon Pilling, RN on: 04/05/2018 4:27 PM  Actions taken: Flowsheet accepted, Sign clinical note

## 2018-04-07 ENCOUNTER — Encounter (HOSPITAL_COMMUNITY)
Admission: RE | Admit: 2018-04-07 | Discharge: 2018-04-07 | Disposition: A | Discharge status: Home / Self Care | Payer: PPO | Source: Ambulatory Visit | Attending: Pulmonary Disease | Admitting: Pulmonary Disease

## 2018-04-07 DIAGNOSIS — J438 Other emphysema: Secondary | ICD-10-CM | POA: Diagnosis not present

## 2018-04-07 DIAGNOSIS — J439 Emphysema, unspecified: Secondary | ICD-10-CM

## 2018-04-07 NOTE — Progress Notes (Signed)
Daily Session Note  Patient Details  Name: Corey Tyler MRN: [id number] Date of Birth: 10/16/1941 Referring Provider:     Pulmonary Rehab Walk Test from 02/15/2018 in [facility]  Referring Provider  Dr. Yacoub      Encounter Date: 04/07/2018  Check In: Session Check In - 04/07/18 1620      Check-In   Location  [facility]    Staff Present   , MS, ACSM RCEP, Exercise Physiologist;Lisa Hughes, RN    Supervising physician immediately available to respond to emergencies  [facility] immediately available    Physician(s)   Dr. Adhikari    Medication changes reported      No    Fall or balance concerns reported     No    Tobacco Cessation  No Change    Warm-up and Cool-down  Performed as group-led instruction    Resistance Training Performed  Yes    VAD Patient?  No      Pain Assessment   Currently in Pain?  No/denies    Multiple Pain Sites  No       Capillary Blood Glucose: No results found for this or any previous visit (from the past 24 hour(s)).    Social History   Tobacco Use  Smoking Status Former Smoker  . Packs/day: 2.50  . Years: 40.00  . Pack years: 100.00  . Types: Cigarettes  . Last attempt to quit: 11/24/1983  . Years since quitting: 34.3  Smokeless Tobacco Never Used  Tobacco Comment   2 1/2 ppd x 40 years    Goals Met:  Exercise tolerated well No report of cardiac concerns or symptoms Strength training completed today  Goals Unmet:  Not Applicable  Comments: Service time is from 1:30p to 3:45p    Dr. Wesam G. Yacoub is Medical Director for Pulmonary Rehab at [facility]. 

## 2018-04-12 ENCOUNTER — Encounter: Payer: Self-pay | Admitting: Internal Medicine

## 2018-04-12 ENCOUNTER — Encounter (HOSPITAL_COMMUNITY): Payer: PPO

## 2018-04-12 ENCOUNTER — Ambulatory Visit: Payer: PPO | Admitting: Internal Medicine

## 2018-04-12 VITALS — BP 94/64 | HR 63 | Ht 73.0 in | Wt 227.0 lb

## 2018-04-12 DIAGNOSIS — I472 Ventricular tachycardia, unspecified: Secondary | ICD-10-CM

## 2018-04-12 DIAGNOSIS — I5022 Chronic systolic (congestive) heart failure: Secondary | ICD-10-CM | POA: Diagnosis not present

## 2018-04-12 DIAGNOSIS — I255 Ischemic cardiomyopathy: Secondary | ICD-10-CM | POA: Diagnosis not present

## 2018-04-12 LAB — CUP PACEART INCLINIC DEVICE CHECK
Battery Remaining Longevity: 33 mo
Brady Statistic RV Percent Paced: 1 %
Date Time Interrogation Session: 20190521171856
HighPow Impedance: 82.125
Implantable Lead Implant Date: 20130213
Implantable Lead Location: 753860
Implantable Pulse Generator Implant Date: 20130213
Lead Channel Impedance Value: 387.5 Ohm
Lead Channel Pacing Threshold Amplitude: 0.5 V
Lead Channel Pacing Threshold Amplitude: 1 V
Lead Channel Pacing Threshold Pulse Width: 0.5 ms
Lead Channel Pacing Threshold Pulse Width: 0.5 ms
Lead Channel Pacing Threshold Pulse Width: 0.6 ms
Lead Channel Sensing Intrinsic Amplitude: 12 mV
Lead Channel Setting Pacing Amplitude: 1.5 V
Lead Channel Setting Pacing Amplitude: 2.5 V
Lead Channel Setting Pacing Pulse Width: 0.6 ms
Lead Channel Setting Sensing Sensitivity: 0.5 mV
MDC IDC LEAD IMPLANT DT: 20130213
MDC IDC LEAD LOCATION: 753859
MDC IDC MSMT LEADCHNL RA IMPEDANCE VALUE: 425 Ohm
MDC IDC MSMT LEADCHNL RA PACING THRESHOLD AMPLITUDE: 0.5 V
MDC IDC MSMT LEADCHNL RA SENSING INTR AMPL: 1.4 mV
MDC IDC MSMT LEADCHNL RV PACING THRESHOLD AMPLITUDE: 1 V
MDC IDC MSMT LEADCHNL RV PACING THRESHOLD PULSEWIDTH: 0.6 ms
MDC IDC STAT BRADY RA PERCENT PACED: 68 %
Pulse Gen Serial Number: 7003419

## 2018-04-12 NOTE — Patient Instructions (Signed)
Medication Instructions: Your physician recommends that you continue on your current medications as directed. Please refer to the Current Medication list given to you today.  Labwork: None Ordered  Procedures/Testing: None Ordered  Follow-Up: Your physician wants you to follow-up in: 1 year with Dr. [name]. You will receive a reminder letter in the mail two months in advance. If you don't receive a letter, please call our office to schedule the follow-up appointment.  Remote monitoring is used to monitor your ICD from home. This monitoring reduces the number of office visits required to check your device to one time per year. It allows [place] to keep an eye on the functioning of your device to ensure it is working properly. You are scheduled for a device check from home on 05/24/18. You may send your transmission at any time that day. If you have a wireless device, the transmission will be sent automatically. After your physician reviews your transmission, you will receive a postcard with your next transmission date.    If you need a refill on your cardiac medications before your next appointment, please call your pharmacy.

## 2018-04-12 NOTE — Progress Notes (Signed)
Patient Care Team: [name], MD as PCP - General (Internal Medicine)   HPI  Corey Tyler is a 77 y.o. male Seen in followup for ICD implantation 2/13 for primary prevention. This occurred in the context of ischemic/nonischemic cardiomyopathy with underlying bradycardia.    December 2014 he had appropriate ICD discharge for polymorphic ventricular tachycardia. His Coreg was up titrated.   Subsequent Myoview scan was not gated; it demonstrated a large inferolateral and anterolateral scar without ischemia.  He also has a history of atrial fibrillation. In the past he was treated with Rivaroxaban which was complicated by GI bleeding. He was then treated with apixaban. He is also been treated with amiodarone.  12/17 he was admitted with sustained ventricular tachycardia below his detection rate. Amiodarone was uptitrated device was reprogrammed.    He was ablated  7/18 (GT)   11/18 Interval ventricular tachycardia prompted the recent introduction of ranolazine adjunctive to his amiodarone;  No further VT and he feels better   He had recurrent ventricular tachycardia again in the spring.  It is been quiesced and since his ranolazine was uptitrated from 500--1000 mg twice daily.  He remains short of breath he is on oxygen.  This is relatively stable.  2 weeks ago he went to lunch @ [employer] He became acutely weak his wife describes him as pale he was diaphoretic.  Symptoms abated.  There was some orthostatic lightheadedness.      DATE TEST    2008 echo EF40-45%   1/14 echo   EF 15 %   8/17 echo   EF 25 %   10/18 Echo  EF 45-50%       Date TSH LFTs Hgb PFTs  5/15  8.63 23    12/17  4.55 18    10/18 1.603 27    2/19 4.03 17 14.1         Past Medical History:  Diagnosis Date  . A-fib ([place])    a. Noted on 12/2012 interrogation, placed on Apixaban and ultimately stopped NOACs 2/2 personal decision 8/14.  Marland Kitchen AICD (automatic cardioverter/defibrillator) present    Cardiac defibrillator -dual  [employer]  . Arthritis   . Atrial tachycardia-non sustained    a. Noted on 03/2012 interrogation.  . Chronic systolic heart failure ([facility])    EF down to 20 to 25% per echo November 2012  . COPD (chronic obstructive pulmonary disease) ([place])   . Hyperlipidemia   . Hypertension   . ICD (implantable cardiac defibrillator) in place   . NICM (nonischemic cardiomyopathy) ([place])    a. Normal cors 2000. b. Minimal plaque 2013.  . Old myocardial infarction    a. ?Silent MI. b. Large fixed inferolateral defect c/w with prior infarct, no ischemia. c. Cath in 2000/2013 with only minimal CAD.  Marland Kitchen Presence of permanent cardiac pacemaker   . Pulmonary nodule, right    Last scan in 2010 showing stability; felt to be benign.  Marland Kitchen PVC's (premature ventricular contractions)   . Ventricular tachycardia, polymorphic ([place])    Rx via ICD 12/14    Past Surgical History:  Procedure Laterality Date  . APPENDECTOMY    . CARDIAC CATHETERIZATION  2000  . CARDIAC DEFIBRILLATOR PLACEMENT    . CARDIOVERSION  03/30/2013   Procedure: CARDIOVERSION;  Surgeon: Thayer Headings, MD;  Location: [facility];  Service: Cardiovascular;;  . COLON SURGERY    . COLONOSCOPY N/A 09/25/2013   Procedure: COLONOSCOPY;  Surgeon: Jerene Bears,  MD;  Location: [facility];  Service: Endoscopy;  Laterality: N/A;  . ICD  12/2011   [name]  . IMPLANTABLE CARDIOVERTER DEFIBRILLATOR IMPLANT N/A 01/06/2012   Procedure: IMPLANTABLE CARDIOVERTER DEFIBRILLATOR IMPLANT;  Surgeon: Deboraha Sprang, MD;  Location: [facility] CATH LAB;  Service: Cardiovascular;  Laterality: N/A;  . PACEMAKER IMPLANT     St [name]  . TEE WITHOUT CARDIOVERSION N/A 03/30/2013   Procedure: TRANSESOPHAGEAL ECHOCARDIOGRAM (TEE);  Surgeon: Thayer Headings, MD;  Location: [facility];  Service: Cardiovascular;  Laterality: N/A;  . V TACH ABLATION  06/10/2017  . V TACH ABLATION N/A 06/10/2017   Procedure: V Tach Ablation;  Surgeon: Evans Lance, MD;  Location:  [facility] CV LAB;  Service: Cardiovascular;  Laterality: N/A;    Current Outpatient Medications  Medication Sig Dispense Refill  . Albuterol Sulfate (PROAIR RESPICLICK) 211 (90 Base) MCG/ACT AEPB Inhale 2 puffs into the lungs every 6 (six) hours as needed (shortness of breath/ wheezing).    Marland Kitchen amiodarone (PACERONE) 200 MG tablet Take 2 tablets (400 mg total) by mouth daily. Take 400mg  by mouth daily for 1 month, then resume 200mg  by mouth daily dose. 60 tablet 0  . Carboxymethylcellul-Glycerin (LUBRICATING EYE DROPS OP) Apply 1 drop to eye daily as needed (dry eyes).    . cholecalciferol (VITAMIN D) 1000 UNITS tablet Take 1,000 Units by mouth daily.    Marland Kitchen ELIQUIS 5 MG TABS tablet TAKE 1 TABLET BY MOUTH TWICE A DAY 60 tablet 5  . esomeprazole (NEXIUM) 40 MG capsule Take 1 capsule (40 mg total) by mouth daily. 90 capsule 3  . finasteride (PROSCAR) 5 MG tablet Take 5 mg by mouth daily.     . fluticasone (FLONASE) 50 MCG/ACT nasal spray Place 1 spray into both nostrils daily.     . Fluticasone-Salmeterol (ADVAIR DISKUS) 250-50 MCG/DOSE AEPB Inhale 1 puff into the lungs 2 (two) times daily.    Marland Kitchen levothyroxine (SYNTHROID, LEVOTHROID) 125 MCG tablet Take 125 mcg by mouth daily.    . Melatonin 10 MG TABS Take 10 mg by mouth at bedtime.    . metoprolol tartrate (LOPRESSOR) 25 MG tablet Take 1 tablet (25 mg total) by mouth 2 (two) times daily. 60 tablet 0  . Multiple Vitamin (MULTIVITAMIN WITH MINERALS) TABS tablet Take 1 tablet by mouth daily.    . nitroGLYCERIN (NITROSTAT) 0.4 MG SL tablet Place 0.4 mg under the tongue every 5 (five) minutes as needed for chest pain.     . OXYGEN Inhale 3 L into the lungs See admin instructions. Use oxygen whenever resting    . POTASSIUM PO Take 1 tablet by mouth at bedtime.    . promethazine (PHENERGAN) 25 MG tablet Take 25 mg by mouth daily as needed for nausea or vomiting.     . Psyllium (METAMUCIL PO) Take 1 Dose by mouth daily. Mix in liquid and drink     .  ranitidine (ZANTAC) 150 MG tablet Take 1 tablet (150 mg total) by mouth at bedtime. 90 tablet 3  . ranolazine (RANEXA) 1000 MG SR tablet Take 1 tablet (1,000 mg total) by mouth 2 (two) times daily. 180 tablet 3  . sacubitril-valsartan (ENTRESTO) 24-26 MG Take 1 tablet by mouth 2 (two) times daily. 60 tablet 11  . Tamsulosin HCl (FLOMAX) 0.4 MG CAPS Take 0.4 mg by mouth at bedtime.     . torsemide (DEMADEX) 20 MG tablet Take 1 tablet (20 mg total) by mouth daily. 30 tablet 3  . vitamin  B-12 (CYANOCOBALAMIN) 1000 MCG tablet Take 1,000 mcg by mouth daily.     No current facility-administered medications for this visit.     Allergies  Allergen Reactions  . Xarelto [Rivaroxaban] Other (See Comments)    Bleeding  . Adhesive [Tape] Hives, Itching and Rash  . Atorvastatin Cough  . Codeine     nauseated  . Isosorbide Nitrate Other (See Comments)    Made him feel bad  . Latex Itching and Other (See Comments)    Rash, itching, burning  . Levofloxacin Other (See Comments)    Tachycardia  . Lisinopril Cough  . Lorazepam Other (See Comments)    "felt bad"  . Mexiletine     GI distress  . Sertraline Other (See Comments)    Made him feel bad  . Tiotropium Bromide Monohydrate Swelling    Throat swelling    Review of Systems negative except from HPI and PMH  Physical Exam BP 94/64   Pulse 63   Ht 6\' 1"  (1.854 m)   Wt 227 lb (103 kg)   SpO2 94%   BMI 29.95 kg/m  Well developed and nourished in no acute distress HENT normal Neck supple with JVP-flat Clear Device pocket well healed; without hematoma or erythema.  There is no tethering  Regular rate and rhythm, no murmurs or gallops Abd-soft with active BS No Clubbing cyanosis right greater than left plus edema Skin-warm and dry healing excoriation right shin with surrounding erythema A & Oriented  Grossly normal sensory and motor function Device pocket well healed; without hematoma or erythema.  There is no tethering   ECG  Apacing@ 64 28/12/45  Assessment and plan  Atrial fibrillation paroxysmal/persistent   High risk medication  Chronotropic incompetence   Implantable defibrillator-St [name]  The patient's device was interrogated and the information was fully reviewed.  The device was reprogrammed as below  Ischemic/nonischemic cardiomyopathy interval improvement  Congestive heart failure-chronic-systolic  Ventricular tachycardia--recurrent   Hypotension    Presyncope    Patient is without intercurrent ventricular tachycardia since the addition of ranolazine.  With his oxygen dependent COPD I worry about the amiodarone; however, he and his wife would both like to not change his heart medications at the present time.  He will have this discussion with Dr. Gwenette Greet and we will await their guidance.  Amiodarone surveillance labs are within normal limits.  No evidence of amiodarone skin toxicity.  As noted above we have to keep in mind the possibility of lung toxicity.  Without symptoms of ischemia  No intercurrent atrial fibrillation or flutter  His presyncopal episode sounds like post prandial hypotension perhaps in the context of his chronic low blood pressure.  We have discussed the physiology and the importance of staying seated until the symptoms abate.  No arrhythmias were noted.  We spent more than 50% of our >25 min visit in face to face counseling regarding the above

## 2018-04-14 ENCOUNTER — Encounter (HOSPITAL_COMMUNITY)
Admission: RE | Admit: 2018-04-14 | Discharge: 2018-04-14 | Disposition: A | Discharge status: Home / Self Care | Payer: PPO | Source: Ambulatory Visit | Attending: Pulmonary Disease | Admitting: Pulmonary Disease

## 2018-04-14 DIAGNOSIS — J439 Emphysema, unspecified: Secondary | ICD-10-CM

## 2018-04-14 DIAGNOSIS — J438 Other emphysema: Secondary | ICD-10-CM | POA: Diagnosis not present

## 2018-04-14 NOTE — Progress Notes (Signed)
Daily Session Note  Patient Details  Name: Corey Tyler MRN: [id number] Date of Birth: November 02, 1941 Referring Provider:     Pulmonary Rehab Walk Test from 02/15/2018 in [facility]  Referring Provider  Dr. Nelda Marseille      Encounter Date: 04/14/2018  Check In: Session Check In - 04/14/18 1351      Check-In   Location  [facility]    Staff Present  Cloyde Reams Shameer Molstad, MS, ACSM RCEP, Exercise Physiologist;Lisa Ysidro Evert, RN    Supervising physician immediately available to respond to emergencies  [facility] immediately available    Physician(s)  Dr. Wyline Copas    Medication changes reported      No    Fall or balance concerns reported     No    Tobacco Cessation  No Change    Warm-up and Cool-down  Performed as group-led instruction    Resistance Training Performed  Yes    VAD Patient?  No      Pain Assessment   Currently in Pain?  No/denies    Multiple Pain Sites  No       Capillary Blood Glucose: No results found for this or any previous visit (from the past 24 hour(s)).    Social History   Tobacco Use  Smoking Status Former Smoker  . Packs/day: 2.50  . Years: 40.00  . Pack years: 100.00  . Types: Cigarettes  . Last attempt to quit: 11/24/1983  . Years since quitting: 34.4  Smokeless Tobacco Never Used  Tobacco Comment   2 1/2 ppd x 40 years    Goals Met:  Exercise tolerated well No report of cardiac concerns or symptoms Strength training completed today  Goals Unmet:  Not Applicable  Comments: Service time is from 1:30p to 3:30p    Dr. Rush Farmer is Medical Director for Pulmonary Rehab at [facility].

## 2018-04-19 ENCOUNTER — Encounter (HOSPITAL_COMMUNITY)
Admission: RE | Admit: 2018-04-19 | Discharge: 2018-04-19 | Disposition: A | Discharge status: Home / Self Care | Payer: PPO | Source: Ambulatory Visit | Attending: Pulmonary Disease | Admitting: Pulmonary Disease

## 2018-04-19 VITALS — Wt 228.6 lb

## 2018-04-19 DIAGNOSIS — J438 Other emphysema: Secondary | ICD-10-CM | POA: Diagnosis not present

## 2018-04-19 DIAGNOSIS — J439 Emphysema, unspecified: Secondary | ICD-10-CM

## 2018-04-19 NOTE — Progress Notes (Signed)
Daily Session Note  Patient Details  Name: Corey Tyler MRN: [id number] Date of Birth: May 12, 1941 Referring Provider:     Pulmonary Rehab Walk Test from 02/15/2018 in [facility] [facility]  Referring Provider  Dr. Nelda Marseille      Encounter Date: 04/19/2018  Check In: Session Check In - 04/19/18 1329      Check-In   Location  [facility]    Staff Present  Cloyde Reams DiVincenzo, MS, ACSM RCEP, Exercise Physiologist;Annedrea Stackhouse, RN, MHA;Carlette Wilber Oliphant, RN, BSN    Supervising physician immediately available to respond to emergencies  [facility] immediately available    Physician(s)  Dr. Wyline Copas    Medication changes reported      No    Fall or balance concerns reported     No    Tobacco Cessation  No Change    Warm-up and Cool-down  Performed as group-led instruction    Resistance Training Performed  Yes    VAD Patient?  No      Pain Assessment   Currently in Pain?  No/denies    Multiple Pain Sites  No       Capillary Blood Glucose: No results found for this or any previous visit (from the past 24 hour(s)).  Exercise Prescription Changes - 04/19/18 1600      Response to Exercise   Blood Pressure (Admit)  110/68    Blood Pressure (Exercise)  120/72    Blood Pressure (Exit)  102/64    Heart Rate (Admit)  72 bpm    Heart Rate (Exercise)  85 bpm    Heart Rate (Exit)  74 bpm    Oxygen Saturation (Admit)  89 %    Oxygen Saturation (Exercise)  87 %    Oxygen Saturation (Exit)  89 %    Rating of Perceived Exertion (Exercise)  13    Perceived Dyspnea (Exercise)  3    Duration  Progress to 45 minutes of aerobic exercise without signs/symptoms of physical distress    Intensity  THRR unchanged      Progression   Progression  Continue to progress workloads to maintain intensity without signs/symptoms of physical distress.      Resistance Training   Training Prescription  Yes    Weight  blue bands    Reps  10-15    Time  10 Minutes       Oxygen   Oxygen  Continuous    Liters  3-4      NuStep   Level  4    Minutes  17    METs  2.4      Arm Ergometer   Level  5    Minutes  17      Track   Laps  11    Minutes  17       Social History   Tobacco Use  Smoking Status Former Smoker  . Packs/day: 2.50  . Years: 40.00  . Pack years: 100.00  . Types: Cigarettes  . Last attempt to quit: 11/24/1983  . Years since quitting: 34.4  Smokeless Tobacco Never Used  Tobacco Comment   2 1/2 ppd x 40 years    Goals Met:  Exercise tolerated well No report of cardiac concerns or symptoms Strength training completed today  Goals Unmet:  Not Applicable  Comments: Service time is from 1:30 to 3:00pm    Dr. Rush Farmer is Medical Director for Pulmonary Rehab at [facility].

## 2018-04-19 NOTE — Progress Notes (Signed)
Pt in today for pulmonary rehab.  As pt was getting ready to leave he mentioned he was having a "spell"  This lasted for a few seconds and went away.  Further assessing revealed that during exercise pt had another "spell" that was not reported to any staff when it occurred.  Pt bp was wnl - 112/70.  Pt stated that when he saw the NP in February,  in the office about a month ago he told him about his spell.  Asked pt when the pacer was interrogated did he having any afib.  Pt stated that he had a short run and the pacer paced him out. Pt with no other complaints and felt good. Pt advised to send transmission of his ICD and call and let Dr. [name] office know so that his transmission may be reviewed for possible correlation of his "spell" to an arrythmia.  Pt instructed to follow back up with pulmonary staff.  Pt and wife are in agreement and verbalized understanding of the plan.  Cherre Huger, BSN Cardiac and Training and development officer

## 2018-04-21 ENCOUNTER — Emergency Department (HOSPITAL_COMMUNITY)
Admission: EM | Admit: 2018-04-21 | Discharge: 2018-04-21 | Disposition: A | Discharge status: Home / Self Care | Payer: PPO | Attending: Physician Assistant | Admitting: Physician Assistant

## 2018-04-21 ENCOUNTER — Other Ambulatory Visit: Payer: Self-pay

## 2018-04-21 ENCOUNTER — Emergency Department (HOSPITAL_COMMUNITY): Payer: PPO

## 2018-04-21 ENCOUNTER — Encounter (HOSPITAL_COMMUNITY): Payer: Self-pay

## 2018-04-21 DIAGNOSIS — Z91040 Latex allergy status: Secondary | ICD-10-CM | POA: Diagnosis not present

## 2018-04-21 DIAGNOSIS — Z87891 Personal history of nicotine dependence: Secondary | ICD-10-CM | POA: Diagnosis not present

## 2018-04-21 DIAGNOSIS — R05 Cough: Secondary | ICD-10-CM | POA: Diagnosis not present

## 2018-04-21 DIAGNOSIS — J181 Lobar pneumonia, unspecified organism: Secondary | ICD-10-CM | POA: Diagnosis not present

## 2018-04-21 DIAGNOSIS — I110 Hypertensive heart disease with heart failure: Secondary | ICD-10-CM | POA: Insufficient documentation

## 2018-04-21 DIAGNOSIS — I5022 Chronic systolic (congestive) heart failure: Secondary | ICD-10-CM | POA: Insufficient documentation

## 2018-04-21 DIAGNOSIS — R0789 Other chest pain: Secondary | ICD-10-CM | POA: Diagnosis not present

## 2018-04-21 DIAGNOSIS — Z79899 Other long term (current) drug therapy: Secondary | ICD-10-CM | POA: Diagnosis not present

## 2018-04-21 DIAGNOSIS — R0600 Dyspnea, unspecified: Secondary | ICD-10-CM | POA: Insufficient documentation

## 2018-04-21 DIAGNOSIS — R0602 Shortness of breath: Secondary | ICD-10-CM | POA: Diagnosis not present

## 2018-04-21 DIAGNOSIS — J189 Pneumonia, unspecified organism: Secondary | ICD-10-CM | POA: Diagnosis not present

## 2018-04-21 DIAGNOSIS — Z95810 Presence of automatic (implantable) cardiac defibrillator: Secondary | ICD-10-CM | POA: Insufficient documentation

## 2018-04-21 DIAGNOSIS — J449 Chronic obstructive pulmonary disease, unspecified: Secondary | ICD-10-CM | POA: Insufficient documentation

## 2018-04-21 DIAGNOSIS — R531 Weakness: Secondary | ICD-10-CM | POA: Diagnosis present

## 2018-04-21 DIAGNOSIS — Z7901 Long term (current) use of anticoagulants: Secondary | ICD-10-CM | POA: Insufficient documentation

## 2018-04-21 LAB — I-STAT TROPONIN, ED: Troponin i, poc: 0 ng/mL (ref 0.00–0.08)

## 2018-04-21 LAB — CBC
HEMATOCRIT: 41.7 % (ref 39.0–52.0)
HEMOGLOBIN: 13.8 g/dL (ref 13.0–17.0)
MCH: 32.4 pg (ref 26.0–34.0)
MCHC: 33.1 g/dL (ref 30.0–36.0)
MCV: 97.9 fL (ref 78.0–100.0)
Platelets: 200 10*3/uL (ref 150–400)
RBC: 4.26 MIL/uL (ref 4.22–5.81)
RDW: 14.1 % (ref 11.5–15.5)
WBC: 8.5 10*3/uL (ref 4.0–10.5)

## 2018-04-21 LAB — D-DIMER, QUANTITATIVE (NOT AT ARMC): D DIMER QUANT: 0.48 ug{FEU}/mL (ref 0.00–0.50)

## 2018-04-21 LAB — BASIC METABOLIC PANEL
ANION GAP: 12 (ref 5–15)
BUN: 21 mg/dL — ABNORMAL HIGH (ref 6–20)
CALCIUM: 8.8 mg/dL — AB (ref 8.9–10.3)
CHLORIDE: 100 mmol/L — AB (ref 101–111)
CO2: 26 mmol/L (ref 22–32)
Creatinine, Ser: 1.64 mg/dL — ABNORMAL HIGH (ref 0.61–1.24)
GFR calc Af Amer: 45 mL/min — ABNORMAL LOW (ref >60)
GFR calc non Af Amer: 38 mL/min — ABNORMAL LOW (ref >60)
GLUCOSE: 112 mg/dL — AB (ref 65–99)
POTASSIUM: 4 mmol/L (ref 3.5–5.1)
Sodium: 138 mmol/L (ref 135–145)

## 2018-04-21 LAB — BRAIN NATRIURETIC PEPTIDE: B Natriuretic Peptide: 49.3 pg/mL (ref 0.0–100.0)

## 2018-04-21 MED ORDER — SODIUM CHLORIDE 0.9 % IV SOLN
1.0000 g | Freq: Once | INTRAVENOUS | Status: AC
  Administered 2018-04-21: 1 g via INTRAVENOUS
  Filled 2018-04-21: qty 10

## 2018-04-21 MED ORDER — DOXYCYCLINE HYCLATE 100 MG PO CAPS: 100.0000 mg | ORAL_CAPSULE | Freq: Two times a day (BID) | ORAL | 0 refills | Status: DC

## 2018-04-21 MED ORDER — PREDNISONE 20 MG PO TABS: ORAL_TABLET | ORAL | 0 refills | Status: DC

## 2018-04-21 MED ORDER — AZITHROMYCIN 250 MG PO TABS
500.0000 mg | ORAL_TABLET | Freq: Once | ORAL | Status: AC
  Administered 2018-04-21: 500 mg via ORAL
  Filled 2018-04-21: qty 2

## 2018-04-21 MED ORDER — AMOXICILLIN-POT CLAVULANATE 875-125 MG PO TABS: 1.0000 | ORAL_TABLET | Freq: Two times a day (BID) | ORAL | 0 refills | Status: DC

## 2018-04-21 NOTE — ED Provider Notes (Signed)
[facility] EMERGENCY DEPARTMENT Provider Note   CSN: [id number] Arrival date & time: 04/21/18  1324     History   Chief Complaint Chief Complaint  Patient presents with  . Weakness    HPI YASSEEN SALLS is a 77 y.o. male.  HPI   Patient is a 77 year old male presenting with left-sided chest pain.  Patient reports that it hurts occasionally takes a deep breath.  He felt like it was something to do with gastroenterology so he was saw the gastroenterologist who had a negative CAT scan a couple weeks ago.  Patient reports he had mild cough, mild increased shortness of breath.  However he has COPD and is on 4 L of home oxygen normally.  Patient came to ED today because he was felt lightheaded and had a nagging pain in the left lower lung.  Patient does note about a 10 to 15 pound weight gain over the last 4 to 6 weeks. Patient is on multiple blood pressure medications, blood pressure usually runs around 100/60.  Past Medical History:  Diagnosis Date  . A-fib ([place])    a. Noted on 12/2012 interrogation, placed on Apixaban and ultimately stopped NOACs 2/2 personal decision 8/14.  Marland Kitchen AICD (automatic cardioverter/defibrillator) present    Cardiac defibrillator -dual  [employer]  . Arthritis   . Atrial tachycardia-non sustained    a. Noted on 03/2012 interrogation.  . Chronic systolic heart failure (HCC)    EF down to 20 to 25% per echo November 2012  . COPD (chronic obstructive pulmonary disease) ([place])   . Hyperlipidemia   . Hypertension   . ICD (implantable cardiac defibrillator) in place   . NICM (nonischemic cardiomyopathy) ([place])    a. Normal cors 2000. b. Minimal plaque 2013.  . Old myocardial infarction    a. ?Silent MI. b. Large fixed inferolateral defect c/w with prior infarct, no ischemia. c. Cath in 2000/2013 with only minimal CAD.  Marland Kitchen Presence of permanent cardiac pacemaker   . Pulmonary nodule, right    Last scan in 2010 showing stability; felt to be benign.   Marland Kitchen PVC's (premature ventricular contractions)   . Ventricular tachycardia, polymorphic ([place])    Rx via ICD 12/14    Patient Active Problem List   Diagnosis Date Noted  . Acute respiratory failure ([place]) 09/15/2017  . COPD exacerbation ([place]) 09/15/2017  . Sustained ventricular tachycardia ([place]) 06/10/2017  . VT (ventricular tachycardia) ([place]) 11/02/2016  . Benign fibroma of prostate 04/02/2015  . Ventricular tachycardia, polymorphic ([place])   . Diverticulosis of colon without hemorrhage 09/25/2013  . Atrial fibrillation ([place]) 12/29/2012  . Cardiac defibrillator -dual  [employer]   . Hyperlipidemia   . PVCs 11/26/2011  . Chronic systolic heart failure ([place]) 10/07/2011  . Cardiomyopathy, nonischemic and ischemic 09/24/2011  . HYPERTENSION 12/18/2009  . 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 PULMONARY NODULE 12/04/2008    Past Surgical History:  Procedure Laterality Date  . APPENDECTOMY    . CARDIAC CATHETERIZATION  2000  . CARDIAC DEFIBRILLATOR PLACEMENT    . CARDIOVERSION  03/30/2013   Procedure: CARDIOVERSION;  Surgeon: Thayer Headings, MD;  Location: [facility];  Service: Cardiovascular;;  . COLON SURGERY    . COLONOSCOPY N/A 09/25/2013   Procedure: COLONOSCOPY;  Surgeon: Jerene Bears, MD;  Location: [facility] ENDOSCOPY;  Service: Endoscopy;  Laterality: N/A;  . ICD  12/2011   [name]  . IMPLANTABLE CARDIOVERTER DEFIBRILLATOR IMPLANT N/A 01/06/2012  Procedure: IMPLANTABLE CARDIOVERTER DEFIBRILLATOR IMPLANT;  Surgeon: Deboraha Sprang, MD;  Location[facility] C[facility] LAB;  Service: Cardiovascular;  Laterality: N/A;  . PACEMAKER IMPLANT     [name]  . TEE WITHOUT CARDIOVERSION N/A 03/30/2013   Procedure: TRANSESOPHAGEAL ECHOCARDIOGRAM (TEE);  Surgeon: Thayer Headings, MD;  Location: [facility];  Service: Cardiovascular;  Laterality: N/A;  . V TACH ABLATION  06/10/2017  . V TACH ABLATION N/A 06/10/2017   Procedure: V Tach Ablation;  Surgeon: Evans Lance,  MD;  Location: [facility] CV LAB;  Service: Cardiovascular;  Laterality: N/A;        Home Medications    Prior to Admission medications   Medication Sig Start Date End Date Taking? Authorizing Provider  Albuterol Sulfate (PROAIR RESPICLICK) 546 (90 Base) MCG/ACT AEPB Inhale 2 puffs into the lungs every 6 (six) hours as needed (shortness of breath/ wheezing).   Yes [provider]  amiodarone (PACERONE) 200 MG tablet Take 2 tablets (400 mg total) by mouth daily. Take 400mg  by mouth daily for 1 month, then resume 200mg  by mouth daily dose. Patient taking differently: Take 200 mg by mouth daily.  12/02/17  Yes Deboraha Sprang, MD  Carboxymethylcellul-Glycerin (LUBRICATING EYE DROPS OP) Apply 1 drop to eye daily as needed (dry eyes).   Yes [provider]  cholecalciferol (VITAMIN D) 1000 UNITS tablet Take 1,000 Units by mouth daily.   Yes [provider]  ELIQUIS 5 MG TABS tablet TAKE 1 TABLET BY MOUTH TWICE A DAY 07/30/15  Yes Minus Breeding, MD  finasteride (PROSCAR) 5 MG tablet Take 5 mg by mouth daily.    Yes [provider]  fluticasone (FLONASE) 50 MCG/ACT nasal spray Place 1 spray into both nostrils daily as needed for allergies.  09/08/17  Yes [provider]  Fluticasone-Salmeterol (ADVAIR DISKUS) 250-50 MCG/DOSE AEPB Inhale 1 puff into the lungs 2 (two) times daily.   Yes [provider]  levothyroxine (SYNTHROID, LEVOTHROID) 125 MCG tablet Take 125 mcg by mouth daily. 06/15/17  Yes [provider]  Melatonin 3 MG TABS Take 3 mg by mouth at bedtime.   Yes [provider]  metoprolol tartrate (LOPRESSOR) 25 MG tablet Take 1 tablet (25 mg total) by mouth 2 (two) times daily. 09/19/17  Yes [name], MD  Multiple Vitamin (MULTIVITAMIN WITH MINERALS) TABS tablet Take 1 tablet by mouth daily.   Yes [provider]  nitroGLYCERIN (NITROSTAT) 0.4 MG SL tablet Place 0.4 mg under the tongue every 5 (five) minutes as  needed for chest pain.  11/12/11  Yes Burtis Junes, NP  OXYGEN Inhale 3 L into the lungs daily.    Yes [provider]  potassium chloride SA (K-DUR,KLOR-CON) 20 MEQ tablet Take 20 mEq by mouth at bedtime.   Yes [provider]  promethazine (PHENERGAN) 25 MG tablet Take 25 mg by mouth daily as needed for nausea or vomiting.  04/20/17  Yes [provider]  Psyllium (METAMUCIL PO) Take 1 Dose by mouth daily. Mix in liquid and drink    Yes [provider]  ranolazine (RANEXA) 1000 MG SR tablet Take 1 tablet (1,000 mg total) by mouth 2 (two) times daily. 02/24/18  Yes Seiler, Amber K, NP  sacubitril-valsartan (ENTRESTO) 24-26 MG Take 1 tablet by mouth 2 (two) times daily. 03/26/17  Yes Deboraha Sprang, MD  Tamsulosin HCl (FLOMAX) 0.4 MG CAPS Take 0.4 mg by mouth at bedtime.    Yes [provider]  tiotropium (SPIRIVA) 18 MCG inhalation capsule Place 18 mcg into inhaler and inhale 2 (two) times daily.   Yes [provider]  torsemide (DEMADEX) 20 MG tablet Take 1 tablet (20 mg total) by mouth daily. 12/29/17  Yes Seiler, Amber K, NP  vitamin B-12 (CYANOCOBALAMIN) 1000 MCG tablet Take 1,000 mcg by mouth daily.   Yes [provider]  esomeprazole (NEXIUM) 40 MG capsule Take 1 capsule (40 mg total) by mouth daily. Patient not taking: Reported on 04/21/2018 01/20/18   Levin Erp, PA  ranitidine (ZANTAC) 150 MG tablet Take 1 tablet (150 mg total) by mouth at bedtime. Patient not taking: Reported on 04/21/2018 01/20/18   Levin Erp, PA    Family History Family History  Problem Relation Age of Onset  . Emphysema Mother   . Heart disease Mother        AVR  . Heart failure Father        Died agwe 12  . Esophageal cancer Brother   . Colon cancer Neg Hx     Social History Social History   Tobacco Use  . Smoking status: Former Smoker    Packs/day: 2.50    Years: 40.00    Pack years: 100.00    Types: Cigarettes     Last attempt to quit: 11/24/1983    Years since quitting: 34.4  . Smokeless tobacco: Never Used  . Tobacco comment: 2 1/2 ppd x 40 years  Substance Use Topics  . Alcohol use: No  . Drug use: No     Allergies   Xarelto [rivaroxaban]; Adhesive [tape]; Atorvastatin; Codeine; Isosorbide nitrate; Latex; Levofloxacin; Lisinopril; Lorazepam; Mexiletine; Sertraline; and Tiotropium bromide monohydrate   Review of Systems Review of Systems  Constitutional: Positive for fatigue. Negative for fever.  Respiratory: Positive for cough and shortness of breath.   Neurological: Positive for light-headedness.  All other systems reviewed and are negative.    Physical Exam Updated Vital Signs BP 103/61   Pulse 62   Temp 97.7 F (36.5 C) (Oral)   Resp 16   SpO2 94%   Physical Exam  Constitutional: He is oriented to person, place, and time. He appears well-nourished.  On 4L   HENT:  Head: Normocephalic.  Eyes: Pupils are equal, round, and reactive to light. Conjunctivae are normal.  Cardiovascular: Normal rate and regular rhythm.  No murmur heard. Pulmonary/Chest: No respiratory distress. He has no wheezes.  Mild tachypnea.  Crackles LLL  Musculoskeletal: He exhibits edema.  Mild edema bilaterally  Neurological: He is oriented to person, place, and time.  Skin: Skin is warm and dry. He is not diaphoretic.  Psychiatric: He has a normal mood and affect. His behavior is normal.     ED Treatments / Results  Labs (all labs ordered are listed, but only abnormal results are displayed) Labs Reviewed  BASIC METABOLIC PANEL - Abnormal; Notable for the following components:      Result Value   Chloride 100 (*)    Glucose, Bld 112 (*)    BUN 21 (*)    Creatinine, Ser 1.64 (*)    Calcium 8.8 (*)    GFR calc non Af Amer 38 (*)    GFR calc Af Amer 45 (*)    All other components within normal limits  CBC  BRAIN NATRIURETIC PEPTIDE  D-DIMER, QUANTITATIVE (NOT AT [facility])  I-STAT TROPONIN, ED     EKG None  Radiology Dg Chest 2 View  Result Date: 04/21/2018 CLINICAL DATA:  77 year old male with shortness of breath, weakness and near syncope for the past week. Flank pain. EXAM: CHEST - 2 VIEW COMPARISON:  09/15/2017 and earlier. FINDINGS: Confluent opacity at the left lung base with suggestion of small pleural effusion on the lateral view. Stable lung volumes. Mediastinal contours are stable and within normal limits. Chronic left chest AICD. No pneumothorax or pulmonary edema. Stable mild patchy opacity at the right lung base. No acute osseous abnormality identified. Negative visible bowel gas pattern. IMPRESSION: 1. Acute left lung base opacity is nonspecific and might reflect a small left pleural effusion and/or airspace disease such as pneumonia or atelectasis. 2. No other acute cardiopulmonary abnormality. Electronically Signed   By: Genevie Ann M.D.   On: 04/21/2018 14:18    Procedures Procedures (including critical care time)  Medications Ordered in ED Medications  cefTRIAXone (ROCEPHIN) 1 g in sodium chloride 0.9 % 100 mL IVPB (1 g Intravenous New Bag/Given 04/21/18 1630)  azithromycin (ZITHROMAX) tablet 500 mg (500 mg Oral Given 04/21/18 1629)     Initial Impression / Assessment and Plan / ED Course  I have reviewed the triage vital signs and the nursing notes.  Pertinent labs & imaging results that were available during my care of the patient were reviewed by me and considered in my medical decision making (see chart for details).     Patient is a 77 year old male presenting with left-sided chest pain.  Patient reports that it hurts occasionally takes a deep breath.  He felt like it was something to do with gastroenterology so he was saw the gastroenterologist who had a negative CAT scan a couple weeks ago.  Patient reports he had mild cough, mild increased shortness of breath.  However he has COPD and is on 4 L of home oxygen normally.  Patient came to ED today because he  was felt lightheaded and had a nagging pain in the left lower lung.  Patient does note about a 10 to 15 pound weight gain over the last 4 to 6 weeks. Patient is on multiple blood pressure medications, blood pressure usually runs around 100/60.   5:35 PM Patient has  atelectasis versus pneumonia on x-ray.  Given the prolonged course, this is going on for several weeks, a little bit concerned about a pulmonary embolism.  Will send d-dimer.  Patient had no prolonged immobility or any unilateral swelling bilateral lower extremity's.  Patient has expressed if is just pneumonia that he would like to return home.  As he is planning a trip to the beach.  Will send d-dimer, give him initial dose of antibiotics as well as prednisone given his extensive COPD history.\  Patient has reassuring vital signs on home O2. Patient understands retuen precautions.   Final Clinical Impressions(s) / ED Diagnoses   Final diagnoses:  None    ED Discharge Orders    None       Macarthur Critchley, MD 04/21/18 2337

## 2018-04-21 NOTE — Discharge Instructions (Signed)
You were seen today and found to have community-acquired pneumonia.  We also have given you two antibiotifcs help treat this as well as steroids.  Please follow-up with your primary care physician.  We offered you admission at this time and you did not want it right now.  However if you continue to feel ill, or would like further care you are welcome to return back and be admitted.

## 2018-04-21 NOTE — ED Notes (Signed)
Pt ambulated in hall with no assistance or difficulty

## 2018-04-21 NOTE — ED Notes (Signed)
ED Provider at bedside. 

## 2018-04-21 NOTE — ED Notes (Signed)
Spoke with main lab and they will add on D-dimer to previous collection.

## 2018-04-21 NOTE — ED Triage Notes (Signed)
Pt reports he has been having spells of weakness and near syncope for the last week. Pt states today he took a shower and became very weak/fagitued. Pt denies increased sob. Pt does report some pain in the flank area at times. Pt is cardiac pt and sees Dr. [name]. Pt AOX4, no distress noted. 4L of O2 at all times.

## 2018-04-26 ENCOUNTER — Encounter (HOSPITAL_COMMUNITY)
Admission: RE | Admit: 2018-04-26 | Discharge: 2018-04-26 | Disposition: A | Discharge status: Home / Self Care | Payer: PPO | Source: Ambulatory Visit | Attending: Pulmonary Disease | Admitting: Pulmonary Disease

## 2018-04-26 DIAGNOSIS — J438 Other emphysema: Secondary | ICD-10-CM | POA: Insufficient documentation

## 2018-04-27 NOTE — Progress Notes (Signed)
Pulmonary Individual Treatment Plan  Patient Details  Name: Corey Tyler MRN: [id number] Date of Birth: September 22, 1941 Referring Provider:     Pulmonary Rehab Walk Test from 02/15/2018 in [facility]  Referring Provider  Dr. Nelda Marseille      Initial Encounter Date:    Pulmonary Rehab Walk Test from 02/15/2018 in [facility]  Date  02/17/18  Referring Provider  Dr. Nelda Marseille      Visit Diagnosis: Pulmonary emphysema, unspecified emphysema type ([place])  Patient's Home Medications on Admission:   Current Outpatient Medications:  .  Albuterol Sulfate (PROAIR RESPICLICK) 163 (90 Base) MCG/ACT AEPB, Inhale 2 puffs into the lungs every 6 (six) hours as needed (shortness of breath/ wheezing)., Disp: , Rfl:  .  amiodarone (PACERONE) 200 MG tablet, Take 2 tablets (400 mg total) by mouth daily. Take 475m by mouth daily for 1 month, then resume 2045mby mouth daily dose. (Patient taking differently: Take 200 mg by mouth daily. ), Disp: 60 tablet, Rfl: 0 .  amoxicillin-clavulanate (AUGMENTIN) 875-125 MG tablet, Take 1 tablet by mouth every 12 (twelve) hours., Disp: 14 tablet, Rfl: 0 .  Carboxymethylcellul-Glycerin (LUBRICATING EYE DROPS OP), Apply 1 drop to eye daily as needed (dry eyes)., Disp: , Rfl:  .  cholecalciferol (VITAMIN D) 1000 UNITS tablet, Take 1,000 Units by mouth daily., Disp: , Rfl:  .  doxycycline (VIBRAMYCIN) 100 MG capsule, Take 1 capsule (100 mg total) by mouth 2 (two) times daily., Disp: 20 capsule, Rfl: 0 .  ELIQUIS 5 MG TABS tablet, TAKE 1 TABLET BY MOUTH TWICE A DAY, Disp: 60 tablet, Rfl: 5 .  esomeprazole (NEXIUM) 40 MG capsule, Take 1 capsule (40 mg total) by mouth daily. (Patient not taking: Reported on 04/21/2018), Disp: 90 capsule, Rfl: 3 .  finasteride (PROSCAR) 5 MG tablet, Take 5 mg by mouth daily. , Disp: , Rfl:  .  fluticasone (FLONASE) 50 MCG/ACT nasal spray, Place 1 spray into both nostrils daily as needed for  allergies. , Disp: , Rfl:  .  Fluticasone-Salmeterol (ADVAIR DISKUS) 250-50 MCG/DOSE AEPB, Inhale 1 puff into the lungs 2 (two) times daily., Disp: , Rfl:  .  levothyroxine (SYNTHROID, LEVOTHROID) 125 MCG tablet, Take 125 mcg by mouth daily., Disp: , Rfl:  .  Melatonin 3 MG TABS, Take 3 mg by mouth at bedtime., Disp: , Rfl:  .  metoprolol tartrate (LOPRESSOR) 25 MG tablet, Take 1 tablet (25 mg total) by mouth 2 (two) times daily., Disp: 60 tablet, Rfl: 0 .  Multiple Vitamin (MULTIVITAMIN WITH MINERALS) TABS tablet, Take 1 tablet by mouth daily., Disp: , Rfl:  .  nitroGLYCERIN (NITROSTAT) 0.4 MG SL tablet, Place 0.4 mg under the tongue every 5 (five) minutes as needed for chest pain. , Disp: , Rfl:  .  OXYGEN, Inhale 3 L into the lungs daily. , Disp: , Rfl:  .  potassium chloride SA (K-DUR,KLOR-CON) 20 MEQ tablet, Take 20 mEq by mouth at bedtime., Disp: , Rfl:  .  predniSONE (DELTASONE) 20 MG tablet, Day 1 and 2: Take 3 tabs  Day 3-5: Take 2 tabs.  Day 5-8: take 1 tab, Disp: 16 tablet, Rfl: 0 .  promethazine (PHENERGAN) 25 MG tablet, Take 25 mg by mouth daily as needed for nausea or vomiting. , Disp: , Rfl:  .  Psyllium (METAMUCIL PO), Take 1 Dose by mouth daily. Mix in liquid and drink , Disp: , Rfl:  .  ranitidine (ZANTAC) 150 MG tablet,  Take 1 tablet (150 mg total) by mouth at bedtime. (Patient not taking: Reported on 04/21/2018), Disp: 90 tablet, Rfl: 3 .  ranolazine (RANEXA) 1000 MG SR tablet, Take 1 tablet (1,000 mg total) by mouth 2 (two) times daily., Disp: 180 tablet, Rfl: 3 .  sacubitril-valsartan (ENTRESTO) 24-26 MG, Take 1 tablet by mouth 2 (two) times daily., Disp: 60 tablet, Rfl: 11 .  Tamsulosin HCl (FLOMAX) 0.4 MG CAPS, Take 0.4 mg by mouth at bedtime. , Disp: , Rfl:  .  tiotropium (SPIRIVA) 18 MCG inhalation capsule, Place 18 mcg into inhaler and inhale 2 (two) times daily., Disp: , Rfl:  .  torsemide (DEMADEX) 20 MG tablet, Take 1 tablet (20 mg total) by mouth daily., Disp: 30  tablet, Rfl: 3 .  vitamin B-12 (CYANOCOBALAMIN) 1000 MCG tablet, Take 1,000 mcg by mouth daily., Disp: , Rfl:   Past Medical History: Past Medical History:  Diagnosis Date  . A-fib ([place])    a. Noted on 12/2012 interrogation, placed on Apixaban and ultimately stopped NOACs 2/2 personal decision 8/14.  Marland Kitchen AICD (automatic cardioverter/defibrillator) present    Cardiac defibrillator -dual  [facility]  . Arthritis   . Atrial tachycardia-non sustained    a. Noted on 03/2012 interrogation.  . Chronic systolic heart failure (H[facility])    EF down to 20 to 25% per echo November 2012  . COPD (chronic obstructive pulmonary disease) ([place])   . Hyperlipidemia   . Hypertension   . ICD (implantable cardiac defibrillator) in place   . NICM (nonischemic cardiomyopathy) ([place])    a. Normal cors 2000. b. Minimal plaque 2013.  . Old myocardial infarction    a. ?Silent MI. b. Large fixed inferolateral defect c/w with prior infarct, no ischemia. c. Cath in 2000/2013 with only minimal CAD.  Marland Kitchen Presence of permanent cardiac pacemaker   . Pulmonary nodule, right    Last scan in 2010 showing stability; felt to be benign.  Marland Kitchen PVC's (premature ventricular contractions)   . Ventricular tachycardia, polymorphic ([place])    Rx via ICD 12/14    Tobacco Use: Social History   Tobacco Use  Smoking Status Former Smoker  . Packs/day: 2.50  . Years: 40.00  . Pack years: 100.00  . Types: Cigarettes  . Last attempt to quit: 11/24/1983  . Years since quitting: 34.4  Smokeless Tobacco Never Used  Tobacco Comment   2 1/2 ppd x 40 years    Labs: Recent Review Flowsheet Data    Labs for ITP Cardiac and Pulmonary Rehab Latest Ref Rng & Units 08/10/2008 12/18/2011 12/18/2011 03/27/2013 04/17/2014   Cholestrol 0 - 200 mg/dL - - - - 113   LDLCALC 0 - 99 mg/dL - - - - 52   HDL >39.00 mg/dL - - - - 49.80   Trlycerides 0.0 - 149.0 mg/dL - - - - 58.0   Hemoglobin A1c <5.7 % - - - 6.0(H) -   PHART 7.350 - 7.450 - - 7.395 - -   PCO2ART 35.0  - 45.0 mmHg - - 38.1 - -   HCO3 20.0 - 24.0 mEq/L - 23.8 23.4 - -   TCO2 0 - 100 mmol/L '25 25 25 ' - -   ACIDBASEDEF 0.0 - 2.0 mmol/L - 2.0 1.0 - -   O2SAT % - 57.0 93.0 - -      Capillary Blood Glucose: Lab Results  Component Value Date   GLUCAP 109 (H) 07/18/2015     Pulmonary Assessment Scores: Pulmonary Assessment Scores  Gann Name 02/17/18 1603 02/23/18 1155       ADL UCSD   ADL Phase  -  Entry    SOB Score total  -  87      CAT Score   CAT Score  -  22 Entry      mMRC Score   mMRC Score  3  -       Pulmonary Function Assessment: Pulmonary Function Assessment - 02/11/18 1431      Breath   Bilateral Breath Sounds  Clear;Decreased    Shortness of Breath  Limiting activity;Yes       Exercise Target Goals:    Exercise Program Goal: Individual exercise prescription set using results from initial 6 min walk test and THRR while considering  patient's activity barriers and safety.    Exercise Prescription Goal: Initial exercise prescription builds to 30-45 minutes a day of aerobic activity, 2-3 days per week.  Home exercise guidelines will be given to patient during program as part of exercise prescription that the participant will acknowledge.  Activity Barriers & Risk Stratification: Activity Barriers & Cardiac Risk Stratification - 02/11/18 1408      Activity Barriers & Cardiac Risk Stratification   Activity Barriers  Shortness of Breath;Muscular Weakness;Deconditioning;Balance Concerns;History of Falls;Decreased Ventricular Function       6 Minute Walk: 6 Minute Walk    Row Name 02/17/18 1557         6 Minute Walk   Phase  Initial     Distance  1200 feet     Walk Time  6 minutes     # of Rest Breaks  0     MPH  2.27     METS  2.68     RPE  12     Perceived Dyspnea   1     Symptoms  No     Resting HR  66 bpm     Resting BP  108/67     Resting Oxygen Saturation   93 %     Exercise Oxygen Saturation  during 6 min walk  86 %     Max Ex. HR   87 bpm     Max Ex. BP  130/64       Interval HR   1 Minute HR  76     2 Minute HR  70     3 Minute HR  71     4 Minute HR  80     5 Minute HR  87     6 Minute HR  87     2 Minute Post HR  67     Interval Heart Rate?  Yes       Interval Oxygen   Interval Oxygen?  Yes     Baseline Oxygen Saturation %  93 %     1 Minute Oxygen Saturation %  88 %     1 Minute Liters of Oxygen  3 L     2 Minute Oxygen Saturation %  86 %     2 Minute Liters of Oxygen  4 L     3 Minute Oxygen Saturation %  86 %     3 Minute Liters of Oxygen  4 L     4 Minute Oxygen Saturation %  91 %     4 Minute Liters of Oxygen  4 L     5 Minute Oxygen Saturation %  87 %     5 Minute  Liters of Oxygen  6 L     6 Minute Oxygen Saturation %  88 %     6 Minute Liters of Oxygen  6 L     2 Minute Post Oxygen Saturation %  95 %     2 Minute Post Liters of Oxygen  6 L        Oxygen Initial Assessment: Oxygen Initial Assessment - 02/17/18 1603      Initial 6 min Walk   Oxygen Used  Continuous;E-Tanks    Liters per minute  6      Program Oxygen Prescription   Program Oxygen Prescription  Continuous;E-Tanks    Liters per minute  6       Oxygen Re-Evaluation: Oxygen Re-Evaluation    Row Name 02/25/18 1554 03/29/18 0751 04/25/18 1645         Program Oxygen Prescription   Program Oxygen Prescription  Continuous;E-Tanks  Continuous;E-Tanks  Continuous;E-Tanks     Liters per minute  - 3-'6  3  3 ' 3-4       Home Oxygen   Home Oxygen Device  Portable Concentrator;Home Concentrator;E-Tanks  Portable Concentrator;Home Concentrator;E-Tanks  Portable Concentrator;Home Concentrator;E-Tanks     Sleep Oxygen Prescription  Continuous  -  Continuous     Liters per minute  '3  3  3     ' Home Exercise Oxygen Prescription  Pulsed  Pulsed  Pulsed     Liters per minute  '4  4  4     ' Home at Rest Exercise Oxygen Prescription  Continuous  Continuous  Continuous     Liters per minute  '3  3  3     ' Compliance with Home Oxygen Use   No  Yes  Yes       Goals/Expected Outcomes   Short Term Goals  To learn and exhibit compliance with exercise, home and travel O2 prescription;To learn and understand importance of monitoring SPO2 with pulse oximeter and demonstrate accurate use of the pulse oximeter.;To learn and understand importance of maintaining oxygen saturations>88%;To learn and demonstrate proper pursed lip breathing techniques or other breathing techniques.  To learn and exhibit compliance with exercise, home and travel O2 prescription;To learn and understand importance of monitoring SPO2 with pulse oximeter and demonstrate accurate use of the pulse oximeter.;To learn and understand importance of maintaining oxygen saturations>88%;To learn and demonstrate proper pursed lip breathing techniques or other breathing techniques.  To learn and exhibit compliance with exercise, home and travel O2 prescription;To learn and understand importance of monitoring SPO2 with pulse oximeter and demonstrate accurate use of the pulse oximeter.;To learn and understand importance of maintaining oxygen saturations>88%;To learn and demonstrate proper pursed lip breathing techniques or other breathing techniques.     Long  Term Goals  Exhibits compliance with exercise, home and travel O2 prescription;Verbalizes importance of monitoring SPO2 with pulse oximeter and return demonstration;Maintenance of O2 saturations>88%;Exhibits proper breathing techniques, such as pursed lip breathing or other method taught during program session;Compliance with respiratory medication  Exhibits compliance with exercise, home and travel O2 prescription;Verbalizes importance of monitoring SPO2 with pulse oximeter and return demonstration;Maintenance of O2 saturations>88%;Exhibits proper breathing techniques, such as pursed lip breathing or other method taught during program session;Compliance with respiratory medication  Exhibits compliance with exercise, home and travel O2  prescription;Verbalizes importance of monitoring SPO2 with pulse oximeter and return demonstration;Maintenance of O2 saturations>88%;Exhibits proper breathing techniques, such as pursed lip breathing or other method taught during program session;Compliance with respiratory medication  Goals/Expected Outcomes  compliance  compliance  compliance        Oxygen Discharge (Final Oxygen Re-Evaluation): Oxygen Re-Evaluation - 04/25/18 1645      Program Oxygen Prescription   Program Oxygen Prescription  Continuous;E-Tanks    Liters per minute  3 3-4      Home Oxygen   Home Oxygen Device  Portable Concentrator;Home Concentrator;E-Tanks    Sleep Oxygen Prescription  Continuous    Liters per minute  3    Home Exercise Oxygen Prescription  Pulsed    Liters per minute  4    Home at Rest Exercise Oxygen Prescription  Continuous    Liters per minute  3    Compliance with Home Oxygen Use  Yes      Goals/Expected Outcomes   Short Term Goals  To learn and exhibit compliance with exercise, home and travel O2 prescription;To learn and understand importance of monitoring SPO2 with pulse oximeter and demonstrate accurate use of the pulse oximeter.;To learn and understand importance of maintaining oxygen saturations>88%;To learn and demonstrate proper pursed lip breathing techniques or other breathing techniques.    Long  Term Goals  Exhibits compliance with exercise, home and travel O2 prescription;Verbalizes importance of monitoring SPO2 with pulse oximeter and return demonstration;Maintenance of O2 saturations>88%;Exhibits proper breathing techniques, such as pursed lip breathing or other method taught during program session;Compliance with respiratory medication    Goals/Expected Outcomes  compliance       Initial Exercise Prescription: Initial Exercise Prescription - 02/17/18 1600      Date of Initial Exercise RX and Referring Provider   Date  02/17/18    Referring Provider  Dr. Nelda Marseille       Oxygen   Oxygen  Continuous    Liters  6      NuStep   Level  2    SPM  80    Minutes  17    METs  1.5      Arm Ergometer   Level  2    Watts  10    Minutes  17      Track   Laps  5    Minutes  17    METs  1      Prescription Details   Frequency (times per week)  2    Duration  Progress to 45 minutes of aerobic exercise without signs/symptoms of physical distress      Intensity   THRR 40-80% of Max Heartrate  57-114    Ratings of Perceived Exertion  11-13    Perceived Dyspnea  0-4      Progression   Progression  Continue progressive overload as per policy without signs/symptoms or physical distress.      Resistance Training   Training Prescription  Yes    Weight  blue bands    Reps  10-15       Perform Capillary Blood Glucose checks as needed.  Exercise Prescription Changes:  Exercise Prescription Changes    Row Name 02/22/18 1535 03/08/18 1555 03/22/18 1500 04/05/18 1600 04/19/18 1600     Response to Exercise   Blood Pressure (Admit)  90/42  106/60  108/67  120/60  110/68   Blood Pressure (Exercise)  98/52  108/72  110/60  100/54  120/72   Blood Pressure (Exit)  112/60  106/62  90/60  94/62  102/64   Heart Rate (Admit)  64 bpm  69 bpm  65 bpm  66 bpm  72 bpm  Heart Rate (Exercise)  89 bpm  85 bpm  91 bpm  86 bpm  85 bpm   Heart Rate (Exit)  63 bpm  65 bpm  82 bpm  66 bpm  74 bpm   Oxygen Saturation (Admit)  93 %  91 %  88 %  90 %  89 %   Oxygen Saturation (Exercise)  89 %  88 %  88 %  85 %  87 %   Oxygen Saturation (Exit)  95 %  89 %  88 %  92 %  89 %   Rating of Perceived Exertion (Exercise)  '13  13  13  13  13   ' Perceived Dyspnea (Exercise)  '2  2  2  2  3   ' Duration  Progress to 45 minutes of aerobic exercise without signs/symptoms of physical distress 40-80% HRR  Progress to 45 minutes of aerobic exercise without signs/symptoms of physical distress 40-80% HRR  Progress to 45 minutes of aerobic exercise without signs/symptoms of physical distress   Progress to 45 minutes of aerobic exercise without signs/symptoms of physical distress  Progress to 45 minutes of aerobic exercise without signs/symptoms of physical distress   Intensity  THRR unchanged  THRR unchanged  THRR unchanged  THRR unchanged  THRR unchanged     Progression   Progression  Continue to progress workloads to maintain intensity without signs/symptoms of physical distress.  Continue to progress workloads to maintain intensity without signs/symptoms of physical distress.  Continue to progress workloads to maintain intensity without signs/symptoms of physical distress.  Continue to progress workloads to maintain intensity without signs/symptoms of physical distress.  Continue to progress workloads to maintain intensity without signs/symptoms of physical distress.     Resistance Training   Training Prescription  Yes  Yes  Yes  Yes  Yes   Weight  blue bands  blue bands  blue bands  blue bands  blue bands   Reps  10-15  10-15  10-15  10-15  10-15   Time  -  -  10 Minutes  10 Minutes  10 Minutes     Oxygen   Oxygen  Continuous  Continuous  Continuous  Continuous  Continuous   Liters  '6  6  3  ' 3-4  3-4     NuStep   Level  '2  2  3  4  4   ' SPM  80  80  -  -  -   Minutes  '17  17  17  17  17   ' METs  2  1.9  -  2.3  2.4     Arm Ergometer   Level  '2  3  3  4  5   ' Watts  10  10  -  -  -   Minutes  '17  17  17  17  17     ' Track   Laps  13  15  -  15  11   Minutes  '17  17  17  17  17   ' METs  1  -  -  -  -      Exercise Comments:   Exercise Goals and Review:  Exercise Goals    Row Name 02/11/18 1411             Exercise Goals   Increase Physical Activity  Yes       Intervention  Provide advice, education, support and counseling about physical activity/exercise needs.;Develop  an individualized exercise prescription for aerobic and resistive training based on initial evaluation findings, risk stratification, comorbidities and participant's personal goals.        Expected Outcomes  Long Term: Add in home exercise to make exercise part of routine and to increase amount of physical activity.;Long Term: Exercising regularly at least 3-5 days a week.       Increase Strength and Stamina  Yes       Intervention  Develop an individualized exercise prescription for aerobic and resistive training based on initial evaluation findings, risk stratification, comorbidities and participant's personal goals.;Provide advice, education, support and counseling about physical activity/exercise needs.       Expected Outcomes  Short Term: Perform resistance training exercises routinely during rehab and add in resistance training at home;Long Term: Improve cardiorespiratory fitness, muscular endurance and strength as measured by increased METs and functional capacity (6MWT);Short Term: Increase workloads from initial exercise prescription for resistance, speed, and METs.       Able to understand and use rate of perceived exertion (RPE) scale  Yes       Intervention  Provide education and explanation on how to use RPE scale       Expected Outcomes  Short Term: Able to use RPE daily in rehab to express subjective intensity level;Long Term:  Able to use RPE to guide intensity level when exercising independently       Able to understand and use Dyspnea scale  Yes       Intervention  Provide education and explanation on how to use Dyspnea scale       Expected Outcomes  Short Term: Able to use Dyspnea scale daily in rehab to express subjective sense of shortness of breath during exertion;Long Term: Able to use Dyspnea scale to guide intensity level when exercising independently       Knowledge and understanding of Target Heart Rate Range (THRR)  Yes       Intervention  Provide education and explanation of THRR including how the numbers were predicted and where they are located for reference       Expected Outcomes  Short Term: Able to use daily as guideline for intensity in rehab;Short Term:  Able to state/look up THRR;Long Term: Able to use THRR to govern intensity when exercising independently       Understanding of Exercise Prescription  Yes       Intervention  Provide education, explanation, and written materials on patient's individual exercise prescription       Expected Outcomes  Short Term: Able to explain program exercise prescription;Long Term: Able to explain home exercise prescription to exercise independently          Exercise Goals Re-Evaluation : Exercise Goals Re-Evaluation    Row Name 02/25/18 1555 03/29/18 0752 04/25/18 1646         Exercise Goal Re-Evaluation   Exercise Goals Review  Able to understand and use Dyspnea scale;Increase Strength and Stamina;Increase Physical Activity;Able to understand and use rate of perceived exertion (RPE) scale;Knowledge and understanding of Target Heart Rate Range (THRR);Understanding of Exercise Prescription  Able to understand and use Dyspnea scale;Increase Strength and Stamina;Increase Physical Activity;Able to understand and use rate of perceived exertion (RPE) scale;Knowledge and understanding of Target Heart Rate Range (THRR);Understanding of Exercise Prescription  Able to understand and use Dyspnea scale;Increase Strength and Stamina;Increase Physical Activity;Able to understand and use rate of perceived exertion (RPE) scale;Knowledge and understanding of Target Heart Rate Range (THRR);Understanding of Exercise Prescription  Comments  Patient has only attended 2 rehab sessions. Will cont. to monitor and progress as able.   Patient is able to walk up to 15 laps (200 ft) in 15 minutes. Patient is limited by deconditioning and back pain. Will cont. to monitor and progress as able.   Patient is able to walk up to 15 laps (200 ft) in 15 minutes. Patient is limited by deconditioning and back pain. Patient has been having frequent dizzy episodes-presented to the ER. They stated that he has pneumonia. Will cont. to monitor and  progress as able.      Expected Outcomes  Through exercise at rehab and at home, patient will increase strength and stamina. The patient will also gain the confidence and knowledge to maintain an exercise regime at home.   Through exercise at rehab and at home, patient will increase strength and stamina. The patient will also gain the confidence and knowledge to maintain an exercise regime at home.   Through exercise at rehab and at home, patient will increase physical capacity and be able to carry out ADL's with ease at home. Patient will also gain the confidence and knowledge to adhere to an exercise regime at home.        Discharge Exercise Prescription (Final Exercise Prescription Changes): Exercise Prescription Changes - 04/19/18 1600      Response to Exercise   Blood Pressure (Admit)  110/68    Blood Pressure (Exercise)  120/72    Blood Pressure (Exit)  102/64    Heart Rate (Admit)  72 bpm    Heart Rate (Exercise)  85 bpm    Heart Rate (Exit)  74 bpm    Oxygen Saturation (Admit)  89 %    Oxygen Saturation (Exercise)  87 %    Oxygen Saturation (Exit)  89 %    Rating of Perceived Exertion (Exercise)  13    Perceived Dyspnea (Exercise)  3    Duration  Progress to 45 minutes of aerobic exercise without signs/symptoms of physical distress    Intensity  THRR unchanged      Progression   Progression  Continue to progress workloads to maintain intensity without signs/symptoms of physical distress.      Resistance Training   Training Prescription  Yes    Weight  blue bands    Reps  10-15    Time  10 Minutes      Oxygen   Oxygen  Continuous    Liters  3-4      NuStep   Level  4    Minutes  17    METs  2.4      Arm Ergometer   Level  5    Minutes  17      Track   Laps  11    Minutes  17       Nutrition:  Target Goals: Understanding of nutrition guidelines, daily intake of sodium <1517m, cholesterol <2057m calories 30% from fat and 7% or less from saturated fats, daily  to have 5 or more servings of fruits and vegetables.  Biometrics: Pre Biometrics - 04/19/18 1608      Pre Biometrics   Weight  228 lb 9.9 oz (103.7 kg)    BMI (Calculated)  30.17        Nutrition Therapy Plan and Nutrition Goals: Nutrition Therapy & Goals - 04/01/18 1328      Nutrition Therapy   Diet  Heart Healthy      Personal Nutrition Goals  Nutrition Goal  Pt to increase fruit and vegetable consumption by aiming for 1 serving of fruit and/or vegetable at each meal.    Personal Goal #2  Pt to consider eating a meatless meal at least 2 times/week.      Intervention Plan   Intervention  Prescribe, educate and counsel regarding individualized specific dietary modifications aiming towards targeted core components such as weight, hypertension, lipid management, diabetes, heart failure and other comorbidities.    Expected Outcomes  Short Term Goal: Understand basic principles of dietary content, such as calories, fat, sodium, cholesterol and nutrients.;Long Term Goal: Adherence to prescribed nutrition plan.       Nutrition Assessments: Nutrition Assessments - 03/09/18 1052      Rate Your Plate Scores   Pre Score  50       Nutrition Goals Re-Evaluation:   Nutrition Goals Discharge (Final Nutrition Goals Re-Evaluation):   Psychosocial: Target Goals: Acknowledge presence or absence of significant depression and/or stress, maximize coping skills, provide positive support system. Participant is able to verbalize types and ability to use techniques and skills needed for reducing stress and depression.  Initial Review & Psychosocial Screening: Initial Psych Review & Screening - 02/11/18 1433      Initial Review   Current issues with  Current Sleep Concerns      Family Dynamics   Good Support System?  Yes    Comments  children, wife, and siblings      Barriers   Psychosocial barriers to participate in program  There are no identifiable barriers or psychosocial needs.       Screening Interventions   Interventions  Encouraged to exercise       Quality of Life Scores:  Scores of 19 and below usually indicate a poorer quality of life in these areas.  A difference of  2-3 points is a clinically meaningful difference.  A difference of 2-3 points in the total score of the Quality of Life Index has been associated with significant improvement in overall quality of life, self-image, physical symptoms, and general health in studies assessing change in quality of life.   PHQ-9: Recent Review Flowsheet Data    Depression screen [facility] 2/9 02/11/2018 03/09/2016   Decreased Interest 0 0   Down, Depressed, Hopeless 0 0   PHQ - 2 Score 0 0     Interpretation of Total Score  Total Score Depression Severity:  1-4 = Minimal depression, 5-9 = Mild depression, 10-14 = Moderate depression, 15-19 = Moderately severe depression, 20-27 = Severe depression   Psychosocial Evaluation and Intervention: Psychosocial Evaluation - 04/27/18 1457      Psychosocial Evaluation & Interventions   Interventions  Encouraged to exercise with the program and follow exercise prescription    Comments  Pt is supported in his rehab participation. Pt is accompanied by his wife.    Expected Outcomes  patient will remain free from psychosocial barriers to participation    Continue Psychosocial Services   No Follow up required       Psychosocial Re-Evaluation: Psychosocial Re-Evaluation    [place] Name 02/28/18 1642 03/28/18 1545           Psychosocial Re-Evaluation   Current issues with  Current Sleep Concerns  Current Sleep Concerns      Comments  -  patient states his sleep concerns are not related to any psychosocial issues      Expected Outcomes  patient will remain free from psychosocial barriers to participation in pulmonary rehab  patient will remain free from psychosocial barriers to participation in pulmonary rehab      Interventions  Encouraged to attend Pulmonary Rehabilitation for  the exercise  Encouraged to attend Pulmonary Rehabilitation for the exercise      Continue Psychosocial Services   Follow up required by staff  Follow up required by staff         Psychosocial Discharge (Final Psychosocial Re-Evaluation): Psychosocial Re-Evaluation - 03/28/18 1545      Psychosocial Re-Evaluation   Current issues with  Current Sleep Concerns    Comments  patient states his sleep concerns are not related to any psychosocial issues    Expected Outcomes  patient will remain free from psychosocial barriers to participation in pulmonary rehab    Interventions  Encouraged to attend Pulmonary Rehabilitation for the exercise    Continue Psychosocial Services   Follow up required by staff       Education: Education Goals: Education classes will be provided on a weekly basis, covering required topics. Participant will state understanding/return demonstration of topics presented.  Learning Barriers/Preferences: Learning Barriers/Preferences - 02/11/18 1431      Learning Barriers/Preferences   Learning Barriers  None    Learning Preferences  Group Instruction;Individual Instruction;Skilled Demonstration;Verbal Instruction       Education Topics: Risk Factor Reduction:  -Group instruction that is supported by a PowerPoint presentation. Instructor discusses the definition of a risk factor, different risk factors for pulmonary disease, and how the heart and lungs work together.     Nutrition for Pulmonary Patient:  -Group instruction provided by PowerPoint slides, verbal discussion, and written materials to support subject matter. The instructor gives an explanation and review of healthy diet recommendations, which includes a discussion on weight management, recommendations for fruit and vegetable consumption, as well as protein, fluid, caffeine, fiber, sodium, sugar, and alcohol. Tips for eating when patients are short of breath are discussed.   PULMONARY REHAB OTHER RESPIRATORY  from 04/14/2018 in [place]  Date  04/07/18  Educator  edna  Instruction Review Code  2- Demonstrated Understanding      Pursed Lip Breathing:  -Group instruction that is supported by demonstration and informational handouts. Instructor discusses the benefits of pursed lip and diaphragmatic breathing and detailed demonstration on how to preform both.     Oxygen Safety:  -Group instruction provided by PowerPoint, verbal discussion, and written material to support subject matter. There is an overview of "What is Oxygen" and "Why do we need it".  Instructor also reviews how to create a safe environment for oxygen use, the importance of using oxygen as prescribed, and the risks of noncompliance. There is a brief discussion on traveling with oxygen and resources the patient may utilize.   PULMONARY REHAB OTHER RESPIRATORY from 04/14/2018 in [place]  Date  03/10/18  Educator  Truddie Crumble  Instruction Review Code  2- Demonstrated Understanding      Oxygen Equipment:  -Group instruction provided by [facility] Staff utilizing handouts, written materials, and equipment demonstrations.   PULMONARY REHAB OTHER RESPIRATORY from 04/14/2018 in [place]  Date  03/24/18  Educator  george with [employer]  Instruction Review Code  1- Verbalizes Understanding      Signs and Symptoms:  -Group instruction provided by written material and verbal discussion to support subject matter. Warning signs and symptoms of infection, stroke, and heart attack are reviewed and when to call the physician/911 reinforced. Tips for  preventing the spread of infection discussed.   Advanced Directives:  -Group instruction provided by verbal instruction and written material to support subject matter. Instructor reviews Advanced Directive laws and proper instruction for filling out document.   Pulmonary Video:  -Group video  education that reviews the importance of medication and oxygen compliance, exercise, good nutrition, pulmonary hygiene, and pursed lip and diaphragmatic breathing for the pulmonary patient.   Exercise for the Pulmonary Patient:  -Group instruction that is supported by a PowerPoint presentation. Instructor discusses benefits of exercise, core components of exercise, frequency, duration, and intensity of an exercise routine, importance of utilizing pulse oximetry during exercise, safety while exercising, and options of places to exercise outside of rehab.     PULMONARY REHAB OTHER RESPIRATORY from 04/14/2018 in [place]  Date  03/31/18  Educator  EP  Instruction Review Code  1- Verbalizes Understanding      Pulmonary Medications:  -Verbally interactive group education provided by instructor with focus on inhaled medications and proper administration.   Anatomy and Physiology of the Respiratory System and Intimacy:  -Group instruction provided by PowerPoint, verbal discussion, and written material to support subject matter. Instructor reviews respiratory cycle and anatomical components of the respiratory system and their functions. Instructor also reviews differences in obstructive and restrictive respiratory diseases with examples of each. Intimacy, Sex, and Sexuality differences are reviewed with a discussion on how relationships can change when diagnosed with pulmonary disease. Common sexual concerns are reviewed.   MD DAY -A group question and answer session with a medical doctor that allows participants to ask questions that relate to their pulmonary disease state.   PULMONARY REHAB OTHER RESPIRATORY from 04/14/2018 in [place]  Date  03/03/18  Educator  yacoub  Instruction Review Code  2- Demonstrated Understanding      OTHER EDUCATION -Group or individual verbal, written, or video instructions that support the  educational goals of the pulmonary rehab program.   PULMONARY REHAB OTHER RESPIRATORY from 04/14/2018 in [place]  Date  02/24/18 Rodney Booze a sedentary lifestyle]  Educator  EP  Instruction Review Code  1- Verbalizes Understanding      Holiday Eating Survival Tips:  -Group instruction provided by PowerPoint slides, verbal discussion, and written materials to support subject matter. The instructor gives patients tips, tricks, and techniques to help them not only survive but enjoy the holidays despite the onslaught of food that accompanies the holidays.   Knowledge Questionnaire Score: Knowledge Questionnaire Score - 02/23/18 1155      Knowledge Questionnaire Score   Pre Score  16/18       Core Components/Risk Factors/Patient Goals at Admission: Personal Goals and Risk Factors at Admission - 02/11/18 1432      Core Components/Risk Factors/Patient Goals on Admission   Improve shortness of breath with ADL's  Yes    Intervention  Provide education, individualized exercise plan and daily activity instruction to help decrease symptoms of SOB with activities of daily living.    Expected Outcomes  Short Term: Improve cardiorespiratory fitness to achieve a reduction of symptoms when performing ADLs;Long Term: Be able to perform more ADLs without symptoms or delay the onset of symptoms    Heart Failure  Yes    Intervention  Provide a combined exercise and nutrition program that is supplemented with education, support and counseling about heart failure. Directed toward relieving symptoms such as shortness of breath, decreased exercise tolerance, and extremity  edema.    Expected Outcomes  Improve functional capacity of life;Short term: Attendance in program 2-3 days a week with increased exercise capacity. Reported lower sodium intake. Reported increased fruit and vegetable intake. Reports medication compliance.;Short term: Daily weights obtained and reported for  increase. Utilizing diuretic protocols set by physician.;Long term: Adoption of self-care skills and reduction of barriers for early signs and symptoms recognition and intervention leading to self-care maintenance.       Core Components/Risk Factors/Patient Goals Review:  Goals and Risk Factor Review    Row Name 02/28/18 1641 03/28/18 1541 04/27/18 1440         Core Components/Risk Factors/Patient Goals Review   Personal Goals Review  Improve shortness of breath with ADL's;Develop more efficient breathing techniques such as purse lipped breathing and diaphragmatic breathing and practicing self-pacing with activity.;Heart Failure  Improve shortness of breath with ADL's;Develop more efficient breathing techniques such as purse lipped breathing and diaphragmatic breathing and practicing self-pacing with activity.;Heart Failure  Improve shortness of breath with ADL's;Develop more efficient breathing techniques such as purse lipped breathing and diaphragmatic breathing and practicing self-pacing with activity.;Heart Failure     Review  patient has only attended 2 exercise sessions since admission. too early to evaluate progression towards pulmonary rehab goals. will re-evaluate in 30 days and expect to see progress  patient has done well over the past 30 days in pulmonary rehab. he states he is beginning to see an improvement in his shortness of breath with his exertional activities at home and with exercise. he is learning the PLB and pacing technique and beginning to use them independently with cueing. his heartfailure is well managed with medications. he verbalizes weighing himself daily and understanding when to notify MD with weight gain. he has gained 2.4 kg since admission but there is no evidence of fluid overload or edema. will continue to monitor progression towards pulmonary rehab goals over the next 30 days.  patient has done well over the past 30 days in pulmonary rehab. Pt with one ER Visit for  his complaint of feeling weak, pt cxray showed Pnuemonia.  Pt sees improvement in his shortness of breath with his exertional activities at home and with exercise. Corey Tyler is learning the PLB and pacing technique and has been observed to use them independently. Pt  heartfailure is well managed with medications. He verbalizes weighing himself daily and understanding when to notify MD with weight gain. he has gained 2.1 kg since admission but there is no evidence of fluid overload or edema. will continue to monitor progression towards pulmonary rehab goals over the next 30 days.     Expected Outcomes  see admission expected outcomes.  see admission expected outcomes.  see admission expected outcomes.        Core Components/Risk Factors/Patient Goals at Discharge (Final Review):  Goals and Risk Factor Review - 04/27/18 1440      Core Components/Risk Factors/Patient Goals Review   Personal Goals Review  Improve shortness of breath with ADL's;Develop more efficient breathing techniques such as purse lipped breathing and diaphragmatic breathing and practicing self-pacing with activity.;Heart Failure    Review  patient has done well over the past 30 days in pulmonary rehab. Pt with one ER Visit for his complaint of feeling weak, pt cxray showed Pnuemonia.  Pt sees improvement in his shortness of breath with his exertional activities at home and with exercise. Corey Tyler is learning the PLB and pacing technique and has been observed to use them independently. Pt  heartfailure is well managed with medications. He verbalizes weighing himself daily and understanding when to notify MD with weight gain. he has gained 2.1 kg since admission but there is no evidence of fluid overload or edema. will continue to monitor progression towards pulmonary rehab goals over the next 30 days.    Expected Outcomes  see admission expected outcomes.       ITP Comments: ITP Comments    Row Name 03/28/18 1541 04/27/18 1440          ITP Comments  Dr. Jennet Maduro, Medical Director  Dr. Jennet Maduro, Medical Director         Comments:  Pt has completed 15 exercise sessions. Cherre Huger, BSN Cardiac and Training and development officer

## 2018-04-28 ENCOUNTER — Encounter (HOSPITAL_COMMUNITY)
Admission: RE | Admit: 2018-04-28 | Discharge: 2018-04-28 | Disposition: A | Discharge status: Home / Self Care | Payer: PPO | Source: Ambulatory Visit | Attending: Pulmonary Disease | Admitting: Pulmonary Disease

## 2018-04-28 DIAGNOSIS — J439 Emphysema, unspecified: Secondary | ICD-10-CM

## 2018-04-28 DIAGNOSIS — J438 Other emphysema: Secondary | ICD-10-CM | POA: Diagnosis not present

## 2018-04-28 NOTE — Progress Notes (Signed)
Daily Session Note  Patient Details  Name: GLENNIS MONTENEGRO MRN: [id number] Date of Birth: [birth year] Referring Provider:     Pulmonary Rehab Walk Test from 02/15/2018 in [place]  Referring Provider  Dr. Nelda Marseille      Encounter Date: 04/28/2018  Check In: Session Check In - 04/28/18 1538      Check-In   Location  [facility]    Staff Present  Rodney Langton, RN;Carlette Wilber Oliphant, RN, BSN;Molly DiVincenzo, MS, ACSM RCEP, Exercise Physiologist;Annedrea Rosezella [place], RN, [facility]    Supervising physician immediately available to respond to emergencies  [facility] immediately available    Physician(s)  Dr. Tana Coast    Medication changes reported      No    Fall or balance concerns reported     No    Tobacco Cessation  No Change    Warm-up and Cool-down  Performed as group-led instruction    Resistance Training Performed  Yes    VAD Patient?  No      Pain Assessment   Currently in Pain?  No/denies    Multiple Pain Sites  No       Capillary Blood Glucose: No results found for this or any previous visit (from the past 24 hour(s)).    Social History   Tobacco Use  Smoking Status Former Smoker  . Packs/day: 2.50  . Years: 40.00  . Pack years: 100.00  . Types: Cigarettes  . Last attempt to quit: 11/24/1983  . Years since quitting: 34.4  Smokeless Tobacco Never Used  Tobacco Comment   2 1/2 ppd x 40 years    Goals Met:  Exercise tolerated well No report of cardiac concerns or symptoms Strength training completed today  Goals Unmet:  Not Applicable  Comments: Service time is from 1330 to 1505    Dr. Rush Farmer is Medical Director for Pulmonary Rehab at [facility].

## 2018-05-03 ENCOUNTER — Encounter (HOSPITAL_COMMUNITY)
Admission: RE | Admit: 2018-05-03 | Discharge: 2018-05-03 | Disposition: A | Discharge status: Home / Self Care | Payer: PPO | Source: Ambulatory Visit | Attending: Pulmonary Disease | Admitting: Pulmonary Disease

## 2018-05-03 VITALS — Wt 226.2 lb

## 2018-05-03 DIAGNOSIS — J439 Emphysema, unspecified: Secondary | ICD-10-CM

## 2018-05-03 DIAGNOSIS — J438 Other emphysema: Secondary | ICD-10-CM | POA: Diagnosis not present

## 2018-05-03 NOTE — Progress Notes (Signed)
Daily Session Note  Patient Details  Name: Corey Tyler MRN: [id number] Date of Birth: [birth year] Referring Provider:     Pulmonary Rehab Walk Test from 02/15/2018 in [place]  Referring Provider  Dr. Nelda Marseille      Encounter Date: 05/03/2018  Check In: Session Check In - 05/03/18 1330      Check-In   Location  [facility]    Staff Present  Rosebud Poles, RN, BSN;Molly DiVincenzo, MS, ACSM RCEP, Exercise Physiologist;Lisa Ysidro Evert, Felipe Drone, RN, MHA;Olinty Celesta Aver, MS, ACSM CEP, Exercise Physiologist;Carlette Wilber Oliphant, RN, BSN    Supervising physician immediately available to respond to emergencies  [facility] immediately available    Physician(s)  Dr. Tana Coast    Medication changes reported      No    Fall or balance concerns reported     No    Tobacco Cessation  No Change    Warm-up and Cool-down  Performed as group-led instruction    Resistance Training Performed  Yes    VAD Patient?  No      Pain Assessment   Currently in Pain?  No/denies    Multiple Pain Sites  No       Capillary Blood Glucose: No results found for this or any previous visit (from the past 24 hour(s)).  Exercise Prescription Changes - 05/03/18 1500      Response to Exercise   Blood Pressure (Admit)  100/60    Blood Pressure (Exercise)  100/62    Blood Pressure (Exit)  94/64    Heart Rate (Admit)  65 bpm    Heart Rate (Exercise)  90 bpm    Heart Rate (Exit)  66 bpm    Oxygen Saturation (Admit)  93 %    Oxygen Saturation (Exercise)  91 %    Oxygen Saturation (Exit)  92 %    Rating of Perceived Exertion (Exercise)  13    Perceived Dyspnea (Exercise)  1    Duration  Progress to 45 minutes of aerobic exercise without signs/symptoms of physical distress    Intensity  THRR unchanged      Progression   Progression  Continue to progress workloads to maintain intensity without signs/symptoms of physical distress.      Resistance Training    Training Prescription  Yes    Weight  blue bands    Reps  10-15    Time  10 Minutes      Oxygen   Oxygen  Continuous    Liters  3-4      NuStep   Level  3    Minutes  17    METs  1.9      Arm Ergometer   Level  4    Minutes  17      Track   Laps  13    Minutes  17       Social History   Tobacco Use  Smoking Status Former Smoker  . Packs/day: 2.50  . Years: 40.00  . Pack years: 100.00  . Types: Cigarettes  . Last attempt to quit: 11/24/1983  . Years since quitting: 34.4  Smokeless Tobacco Never Used  Tobacco Comment   2 1/2 ppd x 40 years    Goals Met:  Exercise tolerated well Strength training completed today  Goals Unmet:  Not Applicable  Comments: Service time is from 1330 to 1500.    Dr. Rush Farmer is Medical Director for Pulmonary Rehab at  [facility].

## 2018-05-05 ENCOUNTER — Encounter (HOSPITAL_COMMUNITY)
Admission: RE | Admit: 2018-05-05 | Discharge: 2018-05-05 | Disposition: A | Discharge status: Home / Self Care | Payer: PPO | Source: Ambulatory Visit | Attending: Pulmonary Disease | Admitting: Pulmonary Disease

## 2018-05-05 VITALS — Wt 228.8 lb

## 2018-05-05 DIAGNOSIS — R972 Elevated prostate specific antigen [PSA]: Secondary | ICD-10-CM | POA: Diagnosis not present

## 2018-05-05 DIAGNOSIS — J438 Other emphysema: Secondary | ICD-10-CM | POA: Diagnosis not present

## 2018-05-05 DIAGNOSIS — I2510 Atherosclerotic heart disease of native coronary artery without angina pectoris: Secondary | ICD-10-CM | POA: Diagnosis not present

## 2018-05-05 DIAGNOSIS — J439 Emphysema, unspecified: Secondary | ICD-10-CM

## 2018-05-05 DIAGNOSIS — E039 Hypothyroidism, unspecified: Secondary | ICD-10-CM | POA: Diagnosis not present

## 2018-05-05 NOTE — Progress Notes (Signed)
Daily Session Note  Patient Details  Name: Corey Tyler MRN: [id number] Date of Birth: 11/25/1940 Referring Provider:     Pulmonary Rehab Walk Test from 02/15/2018 in [facility]  Referring Provider  Dr. Yacoub      Encounter Date: 05/05/2018  Check In: Session Check In - 05/05/18 1330      Check-In   Location  [facility]    Staff Present   , RN, BSN;Molly DiVincenzo, MS, ACSM RCEP, Exercise Physiologist;Lisa Hughes, RN;Carlette Carlton, RN, BSN;Annedrea Stackhouse, RN, MHA    Supervising physician immediately available to respond to emergencies  [facility] immediately available    Physician(s)  Dr. Joseph    Medication changes reported      No    Tobacco Cessation  No Change    Warm-up and Cool-down  Performed as group-led instruction    Resistance Training Performed  Yes    VAD Patient?  No      Pain Assessment   Currently in Pain?  No/denies    Multiple Pain Sites  No       Capillary Blood Glucose: No results found for this or any previous visit (from the past 24 hour(s)).    Social History   Tobacco Use  Smoking Status Former Smoker  . Packs/day: 2.50  . Years: 40.00  . Pack years: 100.00  . Types: Cigarettes  . Last attempt to quit: 11/24/1983  . Years since quitting: 34.4  Smokeless Tobacco Never Used  Tobacco Comment   2 1/2 ppd x 40 years    Goals Met:  Exercise tolerated well Strength training completed today  Goals Unmet:  Not Applicable  Comments: Service time is from 1330 to 1515    Dr. Wesam G. Yacoub is Medical Director for Pulmonary Rehab at [facility]. 

## 2018-05-09 ENCOUNTER — Telehealth: Payer: Self-pay | Admitting: Internal Medicine

## 2018-05-09 NOTE — Telephone Encounter (Signed)
I spoke with pt. He reports he was feeling very weak this morning. Checked BP at that time (around 10:00) and it was 80/42.  Had taken morning medications around 7:30. Has since rechecked and BP was 111/60.  States had similar episode at [facility] after eating dinner a few weeks ago. He reports he is weak all the time but episode this morning was worse.  He does not check BP daily but states it usually runs 103-104/80.  He rechecked BP while on phone with me and it is now 120/63.  States weakness is not as bad now as earlier today but worse than usual chronic weakness.  He is seeing primary care this week.  He was going to check on this appointment information but then stated he could not get up because he was feeling so weak.  I advised him to call 911.

## 2018-05-09 NOTE — Telephone Encounter (Signed)
Pt calling   Pt c/o BP issue: STAT if pt c/o blurred vision, one-sided weakness or slurred speech  1. What are your last 5 BP readings? Only checked today 6.17.19   85/48  2. Are you having any other symptoms (ex. Dizziness, headache, blurred vision, passed out)? dizziness  3. What is your BP issue? BP is low

## 2018-05-10 ENCOUNTER — Encounter (HOSPITAL_COMMUNITY): Payer: PPO

## 2018-05-10 ENCOUNTER — Telehealth (HOSPITAL_COMMUNITY): Payer: Self-pay | Admitting: Internal Medicine

## 2018-05-12 DIAGNOSIS — I10 Essential (primary) hypertension: Secondary | ICD-10-CM | POA: Diagnosis not present

## 2018-05-12 DIAGNOSIS — R972 Elevated prostate specific antigen [PSA]: Secondary | ICD-10-CM | POA: Diagnosis not present

## 2018-05-12 DIAGNOSIS — I2510 Atherosclerotic heart disease of native coronary artery without angina pectoris: Secondary | ICD-10-CM | POA: Diagnosis not present

## 2018-05-12 DIAGNOSIS — E039 Hypothyroidism, unspecified: Secondary | ICD-10-CM | POA: Diagnosis not present

## 2018-05-12 NOTE — Telephone Encounter (Signed)
noted 

## 2018-05-17 ENCOUNTER — Encounter (HOSPITAL_COMMUNITY): Payer: PPO

## 2018-05-19 ENCOUNTER — Encounter (HOSPITAL_COMMUNITY)
Admission: RE | Admit: 2018-05-19 | Discharge: 2018-05-19 | Disposition: A | Discharge status: Home / Self Care | Payer: PPO | Source: Ambulatory Visit | Attending: Pulmonary Disease | Admitting: Pulmonary Disease

## 2018-05-19 VITALS — Wt 228.0 lb

## 2018-05-19 DIAGNOSIS — J438 Other emphysema: Secondary | ICD-10-CM | POA: Diagnosis not present

## 2018-05-19 DIAGNOSIS — J439 Emphysema, unspecified: Secondary | ICD-10-CM

## 2018-05-19 NOTE — Progress Notes (Signed)
Daily Session Note  Patient Details  Name: Corey Tyler MRN: [id number] Date of Birth: 10/06/1941 Referring Provider:     Pulmonary Rehab Walk Test from 02/15/2018 in [place]  Referring Provider  Dr. Nelda Marseille      Encounter Date: 05/19/2018  Check In: Session Check In - 05/19/18 1230      Check-In   Location  [facility]    Staff Present  Rosebud Poles, RN, BSN;Molly DiVincenzo, MS, ACSM RCEP, Exercise Physiologist;Lisa Ysidro Evert, Felipe Drone, RN, [facility]    Supervising physician immediately available to respond to emergencies  [facility] immediately available    Physician(s)  Dr. Bonner Puna    Medication changes reported      No    Fall or balance concerns reported     No    Tobacco Cessation  No Change    Warm-up and Cool-down  Performed as group-led instruction    Resistance Training Performed  Yes    VAD Patient?  No    PAD/SET Patient?  No      Pain Assessment   Currently in Pain?  No/denies    Multiple Pain Sites  No       Capillary Blood Glucose: No results found for this or any previous visit (from the past 24 hour(s)).    Social History   Tobacco Use  Smoking Status Former Smoker  . Packs/day: 2.50  . Years: 40.00  . Pack years: 100.00  . Types: Cigarettes  . Last attempt to quit: 11/24/1983  . Years since quitting: 34.5  Smokeless Tobacco Never Used  Tobacco Comment   2 1/2 ppd x 40 years    Goals Met:  Exercise tolerated well Strength training completed today  Goals Unmet:  Not Applicable  Comments: Service time is from 1230 to 1445   Dr. Rush Farmer is Medical Director for Pulmonary Rehab at [facility].

## 2018-05-24 ENCOUNTER — Ambulatory Visit (INDEPENDENT_AMBULATORY_CARE_PROVIDER_SITE_OTHER): Payer: PPO | Admitting: *Deleted

## 2018-05-24 ENCOUNTER — Encounter (HOSPITAL_COMMUNITY)
Admission: RE | Admit: 2018-05-24 | Discharge: 2018-05-24 | Disposition: A | Discharge status: Home / Self Care | Payer: PPO | Source: Ambulatory Visit | Attending: Pulmonary Disease | Admitting: Pulmonary Disease

## 2018-05-24 VITALS — Wt 228.2 lb

## 2018-05-24 DIAGNOSIS — I255 Ischemic cardiomyopathy: Secondary | ICD-10-CM | POA: Diagnosis not present

## 2018-05-24 DIAGNOSIS — J438 Other emphysema: Secondary | ICD-10-CM | POA: Insufficient documentation

## 2018-05-24 DIAGNOSIS — J439 Emphysema, unspecified: Secondary | ICD-10-CM

## 2018-05-24 NOTE — Progress Notes (Signed)
Daily Session Note  Patient Details  Name: Corey Tyler MRN: [id number] Date of Birth: [birth year] Referring Provider:     Pulmonary Rehab Walk Test from 02/15/2018 in [place]  Referring Provider  Dr. Nelda Marseille      Encounter Date: 05/24/2018  Check In: Session Check In - 05/24/18 1330      Check-In   Location  [facility]    Staff Present  Rosebud Poles, RN, BSN;Carlette Carlton, RN, BSN;Molly DiVincenzo, MS, ACSM RCEP, Exercise Physiologist;Lisa Ysidro Evert, Felipe Drone, RN, [facility]    Supervising physician immediately available to respond to emergencies  [facility] immediately available    Physician(s)  Dr. Bonner Puna    Medication changes reported      No    Fall or balance concerns reported     No    Tobacco Cessation  No Change    Warm-up and Cool-down  Performed as group-led instruction    Resistance Training Performed  Yes    VAD Patient?  No    PAD/SET Patient?  No      Pain Assessment   Currently in Pain?  No/denies    Multiple Pain Sites  No       Capillary Blood Glucose: No results found for this or any previous visit (from the past 24 hour(s)).    Social History   Tobacco Use  Smoking Status Former Smoker  . Packs/day: 2.50  . Years: 40.00  . Pack years: 100.00  . Types: Cigarettes  . Last attempt to quit: 11/24/1983  . Years since quitting: 34.5  Smokeless Tobacco Never Used  Tobacco Comment   2 1/2 ppd x 40 years    Goals Met:  Exercise tolerated well Strength training completed today  Goals Unmet:  Not Applicable  Comments: Service time is from 1330 to 55    Dr. Rush Farmer is Medical Director for Pulmonary Rehab at [facility].

## 2018-05-24 NOTE — Progress Notes (Signed)
Pulmonary Individual Treatment Plan  Patient Details  Name: Corey Tyler MRN: [id number] Date of Birth: 29-Apr-1941 Referring Provider:     Pulmonary Rehab Walk Test from 02/15/2018 in [place]  Referring Provider  Dr. Nelda Marseille      Initial Encounter Date:    Pulmonary Rehab Walk Test from 02/15/2018 in [facility]  Date  02/17/18      Visit Diagnosis: Pulmonary emphysema, unspecified emphysema type ([place])  Patient's Home Medications on Admission:   Current Outpatient Medications:  .  Albuterol Sulfate (PROAIR RESPICLICK) 607 (90 Base) MCG/ACT AEPB, Inhale 2 puffs into the lungs every 6 (six) hours as needed (shortness of breath/ wheezing)., Disp: , Rfl:  .  amiodarone (PACERONE) 200 MG tablet, Take 2 tablets (400 mg total) by mouth daily. Take 488m by mouth daily for 1 month, then resume 2057mby mouth daily dose. (Patient taking differently: Take 200 mg by mouth daily. ), Disp: 60 tablet, Rfl: 0 .  amoxicillin-clavulanate (AUGMENTIN) 875-125 MG tablet, Take 1 tablet by mouth every 12 (twelve) hours., Disp: 14 tablet, Rfl: 0 .  Carboxymethylcellul-Glycerin (LUBRICATING EYE DROPS OP), Apply 1 drop to eye daily as needed (dry eyes)., Disp: , Rfl:  .  cholecalciferol (VITAMIN D) 1000 UNITS tablet, Take 1,000 Units by mouth daily., Disp: , Rfl:  .  doxycycline (VIBRAMYCIN) 100 MG capsule, Take 1 capsule (100 mg total) by mouth 2 (two) times daily., Disp: 20 capsule, Rfl: 0 .  ELIQUIS 5 MG TABS tablet, TAKE 1 TABLET BY MOUTH TWICE A DAY, Disp: 60 tablet, Rfl: 5 .  esomeprazole (NEXIUM) 40 MG capsule, Take 1 capsule (40 mg total) by mouth daily. (Patient not taking: Reported on 04/21/2018), Disp: 90 capsule, Rfl: 3 .  finasteride (PROSCAR) 5 MG tablet, Take 5 mg by mouth daily. , Disp: , Rfl:  .  fluticasone (FLONASE) 50 MCG/ACT nasal spray, Place 1 spray into both nostrils daily as needed for allergies. , Disp: , Rfl:  .   Fluticasone-Salmeterol (ADVAIR DISKUS) 250-50 MCG/DOSE AEPB, Inhale 1 puff into the lungs 2 (two) times daily., Disp: , Rfl:  .  levothyroxine (SYNTHROID, LEVOTHROID) 125 MCG tablet, Take 125 mcg by mouth daily., Disp: , Rfl:  .  Melatonin 3 MG TABS, Take 3 mg by mouth at bedtime., Disp: , Rfl:  .  metoprolol tartrate (LOPRESSOR) 25 MG tablet, Take 1 tablet (25 mg total) by mouth 2 (two) times daily., Disp: 60 tablet, Rfl: 0 .  Multiple Vitamin (MULTIVITAMIN WITH MINERALS) TABS tablet, Take 1 tablet by mouth daily., Disp: , Rfl:  .  nitroGLYCERIN (NITROSTAT) 0.4 MG SL tablet, Place 0.4 mg under the tongue every 5 (five) minutes as needed for chest pain. , Disp: , Rfl:  .  OXYGEN, Inhale 3 L into the lungs daily. , Disp: , Rfl:  .  potassium chloride SA (K-DUR,KLOR-CON) 20 MEQ tablet, Take 20 mEq by mouth at bedtime., Disp: , Rfl:  .  predniSONE (DELTASONE) 20 MG tablet, Day 1 and 2: Take 3 tabs  Day 3-5: Take 2 tabs.  Day 5-8: take 1 tab, Disp: 16 tablet, Rfl: 0 .  promethazine (PHENERGAN) 25 MG tablet, Take 25 mg by mouth daily as needed for nausea or vomiting. , Disp: , Rfl:  .  Psyllium (METAMUCIL PO), Take 1 Dose by mouth daily. Mix in liquid and drink , Disp: , Rfl:  .  ranitidine (ZANTAC) 150 MG tablet, Take 1 tablet (150 mg total)  by mouth at bedtime. (Patient not taking: Reported on 04/21/2018), Disp: 90 tablet, Rfl: 3 .  ranolazine (RANEXA) 1000 MG SR tablet, Take 1 tablet (1,000 mg total) by mouth 2 (two) times daily., Disp: 180 tablet, Rfl: 3 .  sacubitril-valsartan (ENTRESTO) 24-26 MG, Take 1 tablet by mouth 2 (two) times daily., Disp: 60 tablet, Rfl: 11 .  Tamsulosin HCl (FLOMAX) 0.4 MG CAPS, Take 0.4 mg by mouth at bedtime. , Disp: , Rfl:  .  tiotropium (SPIRIVA) 18 MCG inhalation capsule, Place 18 mcg into inhaler and inhale 2 (two) times daily., Disp: , Rfl:  .  torsemide (DEMADEX) 20 MG tablet, Take 1 tablet (20 mg total) by mouth daily., Disp: 30 tablet, Rfl: 3 .  vitamin B-12  (CYANOCOBALAMIN) 1000 MCG tablet, Take 1,000 mcg by mouth daily., Disp: , Rfl:   Past Medical History: Past Medical History:  Diagnosis Date  . A-fib ([place])    a. Noted on 12/2012 interrogation, placed on Apixaban and ultimately stopped NOACs 2/2 personal decision 8/14.  Marland Kitchen AICD (automatic cardioverter/defibrillator) present    Cardiac defibrillator -dual  [facility]  . Arthritis   . Atrial tachycardia-non sustained    a. Noted on 03/2012 interrogation.  . Chronic systolic heart failure (H[facility])    EF down to 20 to 25% per echo November 2012  . COPD (chronic obstructive pulmonary disease) ([place])   . Hyperlipidemia   . Hypertension   . ICD (implantable cardiac defibrillator) in place   . NICM (nonischemic cardiomyopathy) ([place])    a. Normal cors 2000. b. Minimal plaque 2013.  . Old myocardial infarction    a. ?Silent MI. b. Large fixed inferolateral defect c/w with prior infarct, no ischemia. c. Cath in 2000/2013 with only minimal CAD.  Marland Kitchen Presence of permanent cardiac pacemaker   . Pulmonary nodule, right    Last scan in 2010 showing stability; felt to be benign.  Marland Kitchen PVC's (premature ventricular contractions)   . Ventricular tachycardia, polymorphic ([place])    Rx via ICD 12/14    Tobacco Use: Social History   Tobacco Use  Smoking Status Former Smoker  . Packs/day: 2.50  . Years: 40.00  . Pack years: 100.00  . Types: Cigarettes  . Last attempt to quit: 11/24/1983  . Years since quitting: 34.5  Smokeless Tobacco Never Used  Tobacco Comment   2 1/2 ppd x 40 years    Labs: Recent Review Flowsheet Data    Labs for ITP Cardiac and Pulmonary Rehab Latest Ref Rng & Units 08/10/2008 12/18/2011 12/18/2011 03/27/2013 04/17/2014   Cholestrol 0 - 200 mg/dL - - - - 113   LDLCALC 0 - 99 mg/dL - - - - 52   HDL >39.00 mg/dL - - - - 49.80   Trlycerides 0.0 - 149.0 mg/dL - - - - 58.0   Hemoglobin A1c <5.7 % - - - 6.0(H) -   PHART 7.350 - 7.450 - - 7.395 - -   PCO2ART 35.0 - 45.0 mmHg - - 38.1 - -    HCO3 20.0 - 24.0 mEq/L - 23.8 23.4 - -   TCO2 0 - 100 mmol/L '25 25 25 ' - -   ACIDBASEDEF 0.0 - 2.0 mmol/L - 2.0 1.0 - -   O2SAT % - 57.0 93.0 - -      Capillary Blood Glucose: Lab Results  Component Value Date   GLUCAP 109 (H) 07/18/2015     Pulmonary Assessment Scores: Pulmonary Assessment Scores    Row Name 02/17/18 [phone]  mMRC Score   mMRC Score  3        Pulmonary Function Assessment: Pulmonary Function Assessment - 02/11/18 1431      Breath   Bilateral Breath Sounds  Clear;Decreased    Shortness of Breath  Limiting activity;Yes       Exercise Target Goals:    Exercise Program Goal: Individual exercise prescription set using results from initial 6 min walk test and THRR while considering  patient's activity barriers and safety.   Exercise Prescription Goal: Initial exercise prescription builds to 30-45 minutes a day of aerobic activity, 2-3 days per week.  Home exercise guidelines will be given to patient during program as part of exercise prescription that the participant will acknowledge.  Activity Barriers & Risk Stratification: Activity Barriers & Cardiac Risk Stratification - 02/11/18 1408      Activity Barriers & Cardiac Risk Stratification   Activity Barriers  Shortness of Breath;Muscular Weakness;Deconditioning;Balance Concerns;History of Falls;Decreased Ventricular Function       6 Minute Walk: 6 Minute Walk    Row Name 02/17/18 1557         6 Minute Walk   Phase  Initial     Distance  1200 feet     Walk Time  6 minutes     # of Rest Breaks  0     MPH  2.27     METS  2.68     RPE  12     Perceived Dyspnea   1     Symptoms  No     Resting HR  66 bpm     Resting BP  108/67     Resting Oxygen Saturation   93 %     Exercise Oxygen Saturation  during 6 min walk  86 %     Max Ex. HR  87 bpm     Max Ex. BP  130/64       Interval HR   1 Minute HR  76     2 Minute HR  70     3 Minute HR  71     4 Minute HR  80     5 Minute HR  87      6 Minute HR  87     2 Minute Post HR  67     Interval Heart Rate?  Yes       Interval Oxygen   Interval Oxygen?  Yes     Baseline Oxygen Saturation %  93 %     1 Minute Oxygen Saturation %  88 %     1 Minute Liters of Oxygen  3 L     2 Minute Oxygen Saturation %  86 %     2 Minute Liters of Oxygen  4 L     3 Minute Oxygen Saturation %  86 %     3 Minute Liters of Oxygen  4 L     4 Minute Oxygen Saturation %  91 %     4 Minute Liters of Oxygen  4 L     5 Minute Oxygen Saturation %  87 %     5 Minute Liters of Oxygen  6 L     6 Minute Oxygen Saturation %  88 %     6 Minute Liters of Oxygen  6 L     2 Minute Post Oxygen Saturation %  95 %     2 Minute Post Liters of Oxygen  6  L        Oxygen Initial Assessment: Oxygen Initial Assessment - 02/17/18 1603      Initial 6 min Walk   Oxygen Used  Continuous;E-Tanks    Liters per minute  6      Program Oxygen Prescription   Program Oxygen Prescription  Continuous;E-Tanks    Liters per minute  6       Oxygen Re-Evaluation: Oxygen Re-Evaluation    Row Name 02/25/18 1554 03/29/18 0751 04/25/18 1645 05/20/18 1354       Program Oxygen Prescription   Program Oxygen Prescription  Continuous;E-Tanks  Continuous;E-Tanks  Continuous;E-Tanks  Continuous;E-Tanks    Liters per minute  - 3-'6  3  3 ' 3-4  6    Comments  -  -  -  increased oxygen flow for exercise      Home Oxygen   Home Oxygen Device  Portable Concentrator;Home Concentrator;E-Tanks  Portable Concentrator;Home Concentrator;E-Tanks  Portable Concentrator;Home Concentrator;E-Tanks  Portable Concentrator;Home Concentrator;E-Tanks    Sleep Oxygen Prescription  Continuous  -  Continuous  Continuous    Liters per minute  '3  3  3  3    ' Home Exercise Oxygen Prescription  Pulsed  Pulsed  Pulsed  Pulsed    Liters per minute  '4  4  4  6    ' Home at Rest Exercise Oxygen Prescription  Continuous  Continuous  Continuous  Continuous    Liters per minute  '3  3  3  3    ' Compliance with  Home Oxygen Use  No  Yes  Yes  Yes      Goals/Expected Outcomes   Short Term Goals  To learn and exhibit compliance with exercise, home and travel O2 prescription;To learn and understand importance of monitoring SPO2 with pulse oximeter and demonstrate accurate use of the pulse oximeter.;To learn and understand importance of maintaining oxygen saturations>88%;To learn and demonstrate proper pursed lip breathing techniques or other breathing techniques.  To learn and exhibit compliance with exercise, home and travel O2 prescription;To learn and understand importance of monitoring SPO2 with pulse oximeter and demonstrate accurate use of the pulse oximeter.;To learn and understand importance of maintaining oxygen saturations>88%;To learn and demonstrate proper pursed lip breathing techniques or other breathing techniques.  To learn and exhibit compliance with exercise, home and travel O2 prescription;To learn and understand importance of monitoring SPO2 with pulse oximeter and demonstrate accurate use of the pulse oximeter.;To learn and understand importance of maintaining oxygen saturations>88%;To learn and demonstrate proper pursed lip breathing techniques or other breathing techniques.  To learn and exhibit compliance with exercise, home and travel O2 prescription;To learn and understand importance of monitoring SPO2 with pulse oximeter and demonstrate accurate use of the pulse oximeter.;To learn and understand importance of maintaining oxygen saturations>88%;To learn and demonstrate proper pursed lip breathing techniques or other breathing techniques.    Long  Term Goals  Exhibits compliance with exercise, home and travel O2 prescription;Verbalizes importance of monitoring SPO2 with pulse oximeter and return demonstration;Maintenance of O2 saturations>88%;Exhibits proper breathing techniques, such as pursed lip breathing or other method taught during program session;Compliance with respiratory medication   Exhibits compliance with exercise, home and travel O2 prescription;Verbalizes importance of monitoring SPO2 with pulse oximeter and return demonstration;Maintenance of O2 saturations>88%;Exhibits proper breathing techniques, such as pursed lip breathing or other method taught during program session;Compliance with respiratory medication  Exhibits compliance with exercise, home and travel O2 prescription;Verbalizes importance of monitoring SPO2 with pulse oximeter and return demonstration;Maintenance of  O2 saturations>88%;Exhibits proper breathing techniques, such as pursed lip breathing or other method taught during program session;Compliance with respiratory medication  Exhibits compliance with exercise, home and travel O2 prescription;Verbalizes importance of monitoring SPO2 with pulse oximeter and return demonstration;Maintenance of O2 saturations>88%;Exhibits proper breathing techniques, such as pursed lip breathing or other method taught during program session;Compliance with respiratory medication    Goals/Expected Outcomes  compliance  compliance  compliance  compliance and education       Oxygen Discharge (Final Oxygen Re-Evaluation): Oxygen Re-Evaluation - 05/20/18 1354      Program Oxygen Prescription   Program Oxygen Prescription  Continuous;E-Tanks    Liters per minute  6    Comments  increased oxygen flow for exercise      Home Oxygen   Home Oxygen Device  Portable Concentrator;Home Concentrator;E-Tanks    Sleep Oxygen Prescription  Continuous    Liters per minute  3    Home Exercise Oxygen Prescription  Pulsed    Liters per minute  6    Home at Rest Exercise Oxygen Prescription  Continuous    Liters per minute  3    Compliance with Home Oxygen Use  Yes      Goals/Expected Outcomes   Short Term Goals  To learn and exhibit compliance with exercise, home and travel O2 prescription;To learn and understand importance of monitoring SPO2 with pulse oximeter and demonstrate accurate  use of the pulse oximeter.;To learn and understand importance of maintaining oxygen saturations>88%;To learn and demonstrate proper pursed lip breathing techniques or other breathing techniques.    Long  Term Goals  Exhibits compliance with exercise, home and travel O2 prescription;Verbalizes importance of monitoring SPO2 with pulse oximeter and return demonstration;Maintenance of O2 saturations>88%;Exhibits proper breathing techniques, such as pursed lip breathing or other method taught during program session;Compliance with respiratory medication    Goals/Expected Outcomes  compliance and education       Initial Exercise Prescription: Initial Exercise Prescription - 02/17/18 1600      Date of Initial Exercise RX and Referring Provider   Date  02/17/18    Referring Provider  Dr. Nelda Marseille      Oxygen   Oxygen  Continuous    Liters  6      NuStep   Level  2    SPM  80    Minutes  17    METs  1.5      Arm Ergometer   Level  2    Watts  10    Minutes  17      Track   Laps  5    Minutes  17    METs  1      Prescription Details   Frequency (times per week)  2    Duration  Progress to 45 minutes of aerobic exercise without signs/symptoms of physical distress      Intensity   THRR 40-80% of Max Heartrate  57-114    Ratings of Perceived Exertion  11-13    Perceived Dyspnea  0-4      Progression   Progression  Continue progressive overload as per policy without signs/symptoms or physical distress.      Resistance Training   Training Prescription  Yes    Weight  blue bands    Reps  10-15       Perform Capillary Blood Glucose checks as needed.  Exercise Prescription Changes: Exercise Prescription Changes    Row Name 02/22/18 1535 03/08/18 1555 03/22/18 1500 04/05/18  1600 04/19/18 1600     Response to Exercise   Blood Pressure (Admit)  90/42  106/60  108/67  120/60  110/68   Blood Pressure (Exercise)  98/52  108/72  110/60  100/54  120/72   Blood Pressure (Exit)   112/60  106/62  90/60  94/62  102/64   Heart Rate (Admit)  64 bpm  69 bpm  65 bpm  66 bpm  72 bpm   Heart Rate (Exercise)  89 bpm  85 bpm  91 bpm  86 bpm  85 bpm   Heart Rate (Exit)  63 bpm  65 bpm  82 bpm  66 bpm  74 bpm   Oxygen Saturation (Admit)  93 %  91 %  88 %  90 %  89 %   Oxygen Saturation (Exercise)  89 %  88 %  88 %  85 %  87 %   Oxygen Saturation (Exit)  95 %  89 %  88 %  92 %  89 %   Rating of Perceived Exertion (Exercise)  '13  13  13  13  13   ' Perceived Dyspnea (Exercise)  '2  2  2  2  3   ' Duration  Progress to 45 minutes of aerobic exercise without signs/symptoms of physical distress 40-80% HRR  Progress to 45 minutes of aerobic exercise without signs/symptoms of physical distress 40-80% HRR  Progress to 45 minutes of aerobic exercise without signs/symptoms of physical distress  Progress to 45 minutes of aerobic exercise without signs/symptoms of physical distress  Progress to 45 minutes of aerobic exercise without signs/symptoms of physical distress   Intensity  THRR unchanged  THRR unchanged  THRR unchanged  THRR unchanged  THRR unchanged     Progression   Progression  Continue to progress workloads to maintain intensity without signs/symptoms of physical distress.  Continue to progress workloads to maintain intensity without signs/symptoms of physical distress.  Continue to progress workloads to maintain intensity without signs/symptoms of physical distress.  Continue to progress workloads to maintain intensity without signs/symptoms of physical distress.  Continue to progress workloads to maintain intensity without signs/symptoms of physical distress.     Resistance Training   Training Prescription  Yes  Yes  Yes  Yes  Yes   Weight  blue bands  blue bands  blue bands  blue bands  blue bands   Reps  10-15  10-15  10-15  10-15  10-15   Time  -  -  10 Minutes  10 Minutes  10 Minutes     Oxygen   Oxygen  Continuous  Continuous  Continuous  Continuous  Continuous   Liters  '6  6  3   ' 3-4  3-4     NuStep   Level  '2  2  3  4  4   ' SPM  80  80  -  -  -   Minutes  '17  17  17  17  17   ' METs  2  1.9  -  2.3  2.4     Arm Ergometer   Level  '2  3  3  4  5   ' Watts  10  10  -  -  -   Minutes  '17  17  17  17  17     ' Track   Laps  13  15  -  15  11   Minutes  17  17  17  17  17   METs  1  -  -  -  -   Row Name 05/03/18 1500 05/05/18 1449           Response to Exercise   Blood Pressure (Admit)  100/60  100/60      Blood Pressure (Exercise)  100/62  106/70      Blood Pressure (Exit)  94/64  90/50      Heart Rate (Admit)  65 bpm  67 bpm      Heart Rate (Exercise)  90 bpm  78 bpm      Heart Rate (Exit)  66 bpm  86 bpm      Oxygen Saturation (Admit)  93 %  92 %      Oxygen Saturation (Exercise)  91 %  88 %      Oxygen Saturation (Exit)  92 %  91 %      Rating of Perceived Exertion (Exercise)  13  13      Perceived Dyspnea (Exercise)  1  1      Duration  Progress to 45 minutes of aerobic exercise without signs/symptoms of physical distress  Progress to 45 minutes of aerobic exercise without signs/symptoms of physical distress      Intensity  THRR unchanged  THRR unchanged        Progression   Progression  Continue to progress workloads to maintain intensity without signs/symptoms of physical distress.  Continue to progress workloads to maintain intensity without signs/symptoms of physical distress.        Resistance Training   Training Prescription  Yes  Yes      Weight  blue bands  blue bands      Reps  10-15  10-15      Time  10 Minutes  10 Minutes        Interval Training   Interval Training  -  No        Oxygen   Oxygen  Continuous  Continuous      Liters  3-4  3-4        NuStep   Level  3  4      Minutes  17  17      METs  1.9  2.3        Arm Ergometer   Level  4  5      Minutes  17  17        Track   Laps  13  -      Minutes  17  -         Exercise Comments:   Exercise Goals and Review: Exercise Goals    Row Name 02/11/18 1411              Exercise Goals   Increase Physical Activity  Yes       Intervention  Provide advice, education, support and counseling about physical activity/exercise needs.;Develop an individualized exercise prescription for aerobic and resistive training based on initial evaluation findings, risk stratification, comorbidities and participant's personal goals.       Expected Outcomes  Long Term: Add in home exercise to make exercise part of routine and to increase amount of physical activity.;Long Term: Exercising regularly at least 3-5 days a week.       Increase Strength and Stamina  Yes       Intervention  Develop an individualized exercise prescription for aerobic and resistive training based on initial evaluation findings, risk stratification,  comorbidities and participant's personal goals.;Provide advice, education, support and counseling about physical activity/exercise needs.       Expected Outcomes  Short Term: Perform resistance training exercises routinely during rehab and add in resistance training at home;Long Term: Improve cardiorespiratory fitness, muscular endurance and strength as measured by increased METs and functional capacity (6MWT);Short Term: Increase workloads from initial exercise prescription for resistance, speed, and METs.       Able to understand and use rate of perceived exertion (RPE) scale  Yes       Intervention  Provide education and explanation on how to use RPE scale       Expected Outcomes  Short Term: Able to use RPE daily in rehab to express subjective intensity level;Long Term:  Able to use RPE to guide intensity level when exercising independently       Able to understand and use Dyspnea scale  Yes       Intervention  Provide education and explanation on how to use Dyspnea scale       Expected Outcomes  Short Term: Able to use Dyspnea scale daily in rehab to express subjective sense of shortness of breath during exertion;Long Term: Able to use Dyspnea scale to guide  intensity level when exercising independently       Knowledge and understanding of Target Heart Rate Range (THRR)  Yes       Intervention  Provide education and explanation of THRR including how the numbers were predicted and where they are located for reference       Expected Outcomes  Short Term: Able to use daily as guideline for intensity in rehab;Short Term: Able to state/look up THRR;Long Term: Able to use THRR to govern intensity when exercising independently       Understanding of Exercise Prescription  Yes       Intervention  Provide education, explanation, and written materials on patient's individual exercise prescription       Expected Outcomes  Short Term: Able to explain program exercise prescription;Long Term: Able to explain home exercise prescription to exercise independently          Exercise Goals Re-Evaluation : Exercise Goals Re-Evaluation    Row Name 02/25/18 1555 03/29/18 0752 04/25/18 1646 05/20/18 1356       Exercise Goal Re-Evaluation   Exercise Goals Review  Able to understand and use Dyspnea scale;Increase Strength and Stamina;Increase Physical Activity;Able to understand and use rate of perceived exertion (RPE) scale;Knowledge and understanding of Target Heart Rate Range (THRR);Understanding of Exercise Prescription  Able to understand and use Dyspnea scale;Increase Strength and Stamina;Increase Physical Activity;Able to understand and use rate of perceived exertion (RPE) scale;Knowledge and understanding of Target Heart Rate Range (THRR);Understanding of Exercise Prescription  Able to understand and use Dyspnea scale;Increase Strength and Stamina;Increase Physical Activity;Able to understand and use rate of perceived exertion (RPE) scale;Knowledge and understanding of Target Heart Rate Range (THRR);Understanding of Exercise Prescription  Able to understand and use Dyspnea scale;Increase Strength and Stamina;Increase Physical Activity;Able to understand and use rate of  perceived exertion (RPE) scale;Knowledge and understanding of Target Heart Rate Range (THRR);Understanding of Exercise Prescription    Comments  Patient has only attended 2 rehab sessions. Will cont. to monitor and progress as able.   Patient is able to walk up to 15 laps (200 ft) in 15 minutes. Patient is limited by deconditioning and back pain. Will cont. to monitor and progress as able.   Patient is able to walk up to 15 laps (  200 ft) in 15 minutes. Patient is limited by deconditioning and back pain. Patient has been having frequent dizzy episodes-presented to the ER. They stated that he has pneumonia. Will cont. to monitor and progress as able.   Patient has been having dizzy spells. He states that its due to his BP. Doctors are aware. He was out of rehab for 2 weeks due to not feeling well. Patient presented to rehab yesterday weak. Will cont. to monitor patient and progress as able.      Expected Outcomes  Through exercise at rehab and at home, patient will increase strength and stamina. The patient will also gain the confidence and knowledge to maintain an exercise regime at home.   Through exercise at rehab and at home, patient will increase strength and stamina. The patient will also gain the confidence and knowledge to maintain an exercise regime at home.   Through exercise at rehab and at home, patient will increase physical capacity and be able to carry out ADL's with ease at home. Patient will also gain the confidence and knowledge to adhere to an exercise regime at home.  Through exercise at rehab and at home, patient will increase physical capacity and be able to carry out ADL's with ease at home. Patient will also gain the confidence and knowledge to adhere to an exercise regime at home.       Discharge Exercise Prescription (Final Exercise Prescription Changes): Exercise Prescription Changes - 05/05/18 1449      Response to Exercise   Blood Pressure (Admit)  100/60    Blood Pressure  (Exercise)  106/70    Blood Pressure (Exit)  90/50    Heart Rate (Admit)  67 bpm    Heart Rate (Exercise)  78 bpm    Heart Rate (Exit)  86 bpm    Oxygen Saturation (Admit)  92 %    Oxygen Saturation (Exercise)  88 %    Oxygen Saturation (Exit)  91 %    Rating of Perceived Exertion (Exercise)  13    Perceived Dyspnea (Exercise)  1    Duration  Progress to 45 minutes of aerobic exercise without signs/symptoms of physical distress    Intensity  THRR unchanged      Progression   Progression  Continue to progress workloads to maintain intensity without signs/symptoms of physical distress.      Resistance Training   Training Prescription  Yes    Weight  blue bands    Reps  10-15    Time  10 Minutes      Interval Training   Interval Training  No      Oxygen   Oxygen  Continuous    Liters  3-4      NuStep   Level  4    Minutes  17    METs  2.3      Arm Ergometer   Level  5    Minutes  17       Nutrition:  Target Goals: Understanding of nutrition guidelines, daily intake of sodium <1522m, cholesterol <2052m calories 30% from fat and 7% or less from saturated fats, daily to have 5 or more servings of fruits and vegetables.  Biometrics: Pre Biometrics - 04/19/18 1608      Pre Biometrics   Weight  228 lb 9.9 oz (103.7 kg)    BMI (Calculated)  30.17        Nutrition Therapy Plan and Nutrition Goals: Nutrition Therapy & Goals - 04/01/18 1328  Nutrition Therapy   Diet  Heart Healthy      Personal Nutrition Goals   Nutrition Goal  Pt to increase fruit and vegetable consumption by aiming for 1 serving of fruit and/or vegetable at each meal.    Personal Goal #2  Pt to consider eating a meatless meal at least 2 times/week.      Intervention Plan   Intervention  Prescribe, educate and counsel regarding individualized specific dietary modifications aiming towards targeted core components such as weight, hypertension, lipid management, diabetes, heart failure and other  comorbidities.    Expected Outcomes  Short Term Goal: Understand basic principles of dietary content, such as calories, fat, sodium, cholesterol and nutrients.;Long Term Goal: Adherence to prescribed nutrition plan.       Nutrition Assessments: Nutrition Assessments - 03/09/18 1052      Rate Your Plate Scores   Pre Score  50       Nutrition Goals Re-Evaluation:   Nutrition Goals Discharge (Final Nutrition Goals Re-Evaluation):   Psychosocial: Target Goals: Acknowledge presence or absence of significant depression and/or stress, maximize coping skills, provide positive support system. Participant is able to verbalize types and ability to use techniques and skills needed for reducing stress and depression.  Initial Review & Psychosocial Screening: Initial Psych Review & Screening - 02/11/18 1433      Initial Review   Current issues with  Current Sleep Concerns      Family Dynamics   Good Support System?  Yes    Comments  children, wife, and siblings      Barriers   Psychosocial barriers to participate in program  There are no identifiable barriers or psychosocial needs.      Screening Interventions   Interventions  Encouraged to exercise       Quality of Life Scores:  Scores of 19 and below usually indicate a poorer quality of life in these areas.  A difference of  2-3 points is a clinically meaningful difference.  A difference of 2-3 points in the total score of the Quality of Life Index has been associated with significant improvement in overall quality of life, self-image, physical symptoms, and general health in studies assessing change in quality of life.   PHQ-9: Recent Review Flowsheet Data    Depression screen [facility] 2/9 02/11/2018 03/09/2016   Decreased Interest 0 0   Down, Depressed, Hopeless 0 0   PHQ - 2 Score 0 0     Interpretation of Total Score  Total Score Depression Severity:  1-4 = Minimal depression, 5-9 = Mild depression, 10-14 = Moderate  depression, 15-19 = Moderately severe depression, 20-27 = Severe depression   Psychosocial Evaluation and Intervention: Psychosocial Evaluation - 04/27/18 1457      Psychosocial Evaluation & Interventions   Interventions  Encouraged to exercise with the program and follow exercise prescription    Comments  Pt is supported in his rehab participation. Pt is accompanied by his wife.    Expected Outcomes  patient will remain free from psychosocial barriers to participation    Continue Psychosocial Services   No Follow up required       Psychosocial Re-Evaluation: Psychosocial Re-Evaluation    [place] Name 02/28/18 1642 03/28/18 1545 05/24/18 1104         Psychosocial Re-Evaluation   Current issues with  Current Sleep Concerns  Current Sleep Concerns  Current Sleep Concerns     Comments  -  patient states his sleep concerns are not related to any psychosocial  issues  patient states his sleep concerns are not related to any psychosocial issues     Expected Outcomes  patient will remain free from psychosocial barriers to participation in pulmonary rehab  patient will remain free from psychosocial barriers to participation in pulmonary rehab  patient will remain free from psychosocial barriers to participation in pulmonary rehab     Interventions  Encouraged to attend Pulmonary Rehabilitation for the exercise  Encouraged to attend Pulmonary Rehabilitation for the exercise  Encouraged to attend Pulmonary Rehabilitation for the exercise     Continue Psychosocial Services   Follow up required by staff  Follow up required by staff  Follow up required by staff        Psychosocial Discharge (Final Psychosocial Re-Evaluation): Psychosocial Re-Evaluation - 05/24/18 1104      Psychosocial Re-Evaluation   Current issues with  Current Sleep Concerns    Comments  patient states his sleep concerns are not related to any psychosocial issues    Expected Outcomes  patient will remain free from psychosocial  barriers to participation in pulmonary rehab    Interventions  Encouraged to attend Pulmonary Rehabilitation for the exercise    Continue Psychosocial Services   Follow up required by staff        Education: Education Goals: Education classes will be provided on a weekly basis, covering required topics. Participant will state understanding/return demonstration of topics presented.  Learning Barriers/Preferences: Learning Barriers/Preferences - 02/11/18 1431      Learning Barriers/Preferences   Learning Barriers  None    Learning Preferences  Group Instruction;Individual Instruction;Skilled Demonstration;Verbal Instruction       Education Topics: How Lungs Work and Diseases: - Discuss the anatomy of the lungs and diseases that can affect the lungs, such as COPD.   Exercise: -Discuss the importance of exercise, FITT principles of exercise, normal and abnormal responses to exercise, and how to exercise safely.   Environmental Irritants: -Discuss types of environmental irritants and how to limit exposure to environmental irritants.   Meds/Inhalers and oxygen: - Discuss respiratory medications, definition of an inhaler and oxygen, and the proper way to use an inhaler and oxygen.   Energy Saving Techniques: - Discuss methods to conserve energy and decrease shortness of breath when performing activities of daily living.    Bronchial Hygiene / Breathing Techniques: - Discuss breathing mechanics, pursed-lip breathing technique,  proper posture, effective ways to clear airways, and other functional breathing techniques   Cleaning Equipment: - Provides group verbal and written instruction about the health risks of elevated stress, cause of high stress, and healthy ways to reduce stress.   Nutrition I: Fats: - Discuss the types of cholesterol, what cholesterol does to the body, and how cholesterol levels can be controlled.   Nutrition II: Labels: -Discuss the different  components of food labels and how to read food labels.   Respiratory Infections: - Discuss the signs and symptoms of respiratory infections, ways to prevent respiratory infections, and the importance of seeking medical treatment when having a respiratory infection.   Stress I: Signs and Symptoms: - Discuss the causes of stress, how stress may lead to anxiety and depression, and ways to limit stress.   Stress II: Relaxation: -Discuss relaxation techniques to limit stress.   Oxygen for Home/Travel: - Discuss how to prepare for travel when on oxygen and proper ways to transport and store oxygen to ensure safety.   Knowledge Questionnaire Score:   Core Components/Risk Factors/Patient Goals at Admission: Personal Goals and Risk  Factors at Admission - 02/11/18 1432      Core Components/Risk Factors/Patient Goals on Admission   Improve shortness of breath with ADL's  Yes    Intervention  Provide education, individualized exercise plan and daily activity instruction to help decrease symptoms of SOB with activities of daily living.    Expected Outcomes  Short Term: Improve cardiorespiratory fitness to achieve a reduction of symptoms when performing ADLs;Long Term: Be able to perform more ADLs without symptoms or delay the onset of symptoms    Heart Failure  Yes    Intervention  Provide a combined exercise and nutrition program that is supplemented with education, support and counseling about heart failure. Directed toward relieving symptoms such as shortness of breath, decreased exercise tolerance, and extremity edema.    Expected Outcomes  Improve functional capacity of life;Short term: Attendance in program 2-3 days a week with increased exercise capacity. Reported lower sodium intake. Reported increased fruit and vegetable intake. Reports medication compliance.;Short term: Daily weights obtained and reported for increase. Utilizing diuretic protocols set by physician.;Long term: Adoption of  self-care skills and reduction of barriers for early signs and symptoms recognition and intervention leading to self-care maintenance.       Core Components/Risk Factors/Patient Goals Review:  Goals and Risk Factor Review    Row Name 02/28/18 1641 03/28/18 1541 04/27/18 1440 05/24/18 1101       Core Components/Risk Factors/Patient Goals Review   Personal Goals Review  Improve shortness of breath with ADL's;Develop more efficient breathing techniques such as purse lipped breathing and diaphragmatic breathing and practicing self-pacing with activity.;Heart Failure  Improve shortness of breath with ADL's;Develop more efficient breathing techniques such as purse lipped breathing and diaphragmatic breathing and practicing self-pacing with activity.;Heart Failure  Improve shortness of breath with ADL's;Develop more efficient breathing techniques such as purse lipped breathing and diaphragmatic breathing and practicing self-pacing with activity.;Heart Failure  Improve shortness of breath with ADL's;Heart Failure    Review  patient has only attended 2 exercise sessions since admission. too early to evaluate progression towards pulmonary rehab goals. will re-evaluate in 30 days and expect to see progress  patient has done well over the past 30 days in pulmonary rehab. he states he is beginning to see an improvement in his shortness of breath with his exertional activities at home and with exercise. he is learning the PLB and pacing technique and beginning to use them independently with cueing. his heartfailure is well managed with medications. he verbalizes weighing himself daily and understanding when to notify MD with weight gain. he has gained 2.4 kg since admission but there is no evidence of fluid overload or edema. will continue to monitor progression towards pulmonary rehab goals over the next 30 days.  patient has done well over the past 30 days in pulmonary rehab. Pt with one ER Visit for his complaint of  feeling weak, pt cxray showed Pnuemonia.  Pt sees improvement in his shortness of breath with his exertional activities at home and with exercise. Corey Tyler is learning the PLB and pacing technique and has been observed to use them independently. Pt  heartfailure is well managed with medications. He verbalizes weighing himself daily and understanding when to notify MD with weight gain. he has gained 2.1 kg since admission but there is no evidence of fluid overload or edema. will continue to monitor progression towards pulmonary rehab goals over the next 30 days.  Absent x 1 week due to hypotension and "feeling bad", back to class  last week and tolerated well.  Progressed to level 5 on arm ergometer, 11 laps on track, and level 4 on nustep    Expected Outcomes  see admission expected outcomes.  see admission expected outcomes.  see admission expected outcomes.  see admission expected outcomes.       Core Components/Risk Factors/Patient Goals at Discharge (Final Review):  Goals and Risk Factor Review - 05/24/18 1101      Core Components/Risk Factors/Patient Goals Review   Personal Goals Review  Improve shortness of breath with ADL's;Heart Failure    Review  Absent x 1 week due to hypotension and "feeling bad", back to class last week and tolerated well.  Progressed to level 5 on arm ergometer, 11 laps on track, and level 4 on nustep    Expected Outcomes  see admission expected outcomes.       ITP Comments: ITP Comments    Row Name 03/28/18 1541 04/27/18 1440         ITP Comments  Dr. Jennet Maduro, Medical Director  Dr. Jennet Maduro, Medical Director         Comments: ITP REVIEW Pt is making expected progress toward pulmonary rehab goals after completing 19 sessions. Recommend continued exercise, life style modification, education, and utilization of breathing techniques to increase stamina and strength and decrease shortness of breath with exertion.

## 2018-05-25 ENCOUNTER — Encounter: Payer: Self-pay | Admitting: Cardiology

## 2018-05-25 NOTE — Progress Notes (Signed)
Remote ICD transmission.   

## 2018-05-31 ENCOUNTER — Encounter (HOSPITAL_COMMUNITY): Payer: PPO

## 2018-06-02 DIAGNOSIS — R635 Abnormal weight gain: Secondary | ICD-10-CM | POA: Diagnosis not present

## 2018-06-02 DIAGNOSIS — R609 Edema, unspecified: Secondary | ICD-10-CM | POA: Diagnosis not present

## 2018-06-02 DIAGNOSIS — J181 Lobar pneumonia, unspecified organism: Secondary | ICD-10-CM | POA: Diagnosis not present

## 2018-06-02 DIAGNOSIS — R05 Cough: Secondary | ICD-10-CM | POA: Diagnosis not present

## 2018-06-02 DIAGNOSIS — R0602 Shortness of breath: Secondary | ICD-10-CM | POA: Diagnosis not present

## 2018-06-07 ENCOUNTER — Encounter (HOSPITAL_COMMUNITY)
Admission: RE | Admit: 2018-06-07 | Discharge: 2018-06-07 | Disposition: A | Discharge status: Home / Self Care | Payer: PPO | Source: Ambulatory Visit | Attending: Pulmonary Disease | Admitting: Pulmonary Disease

## 2018-06-09 ENCOUNTER — Telehealth (HOSPITAL_COMMUNITY): Payer: Self-pay | Admitting: *Deleted

## 2018-06-10 ENCOUNTER — Telehealth: Payer: Self-pay

## 2018-06-10 NOTE — Telephone Encounter (Signed)
Patient called to let us know that [facility] will be sending him a new monitor.

## 2018-06-10 NOTE — Telephone Encounter (Signed)
I spoke with the patient about this.

## 2018-06-14 ENCOUNTER — Encounter (HOSPITAL_COMMUNITY): Payer: PPO

## 2018-06-17 ENCOUNTER — Telehealth: Payer: Self-pay | Admitting: *Deleted

## 2018-06-17 IMAGING — DX DG CHEST 2V
2 series · 2 of 2 positions shown · non-contrast
Comparison: 09/03/2017

CLINICAL DATA: Shortness of breath and congestion for 2 weeks

EXAM:
CHEST  2 VIEW

[chest pa]
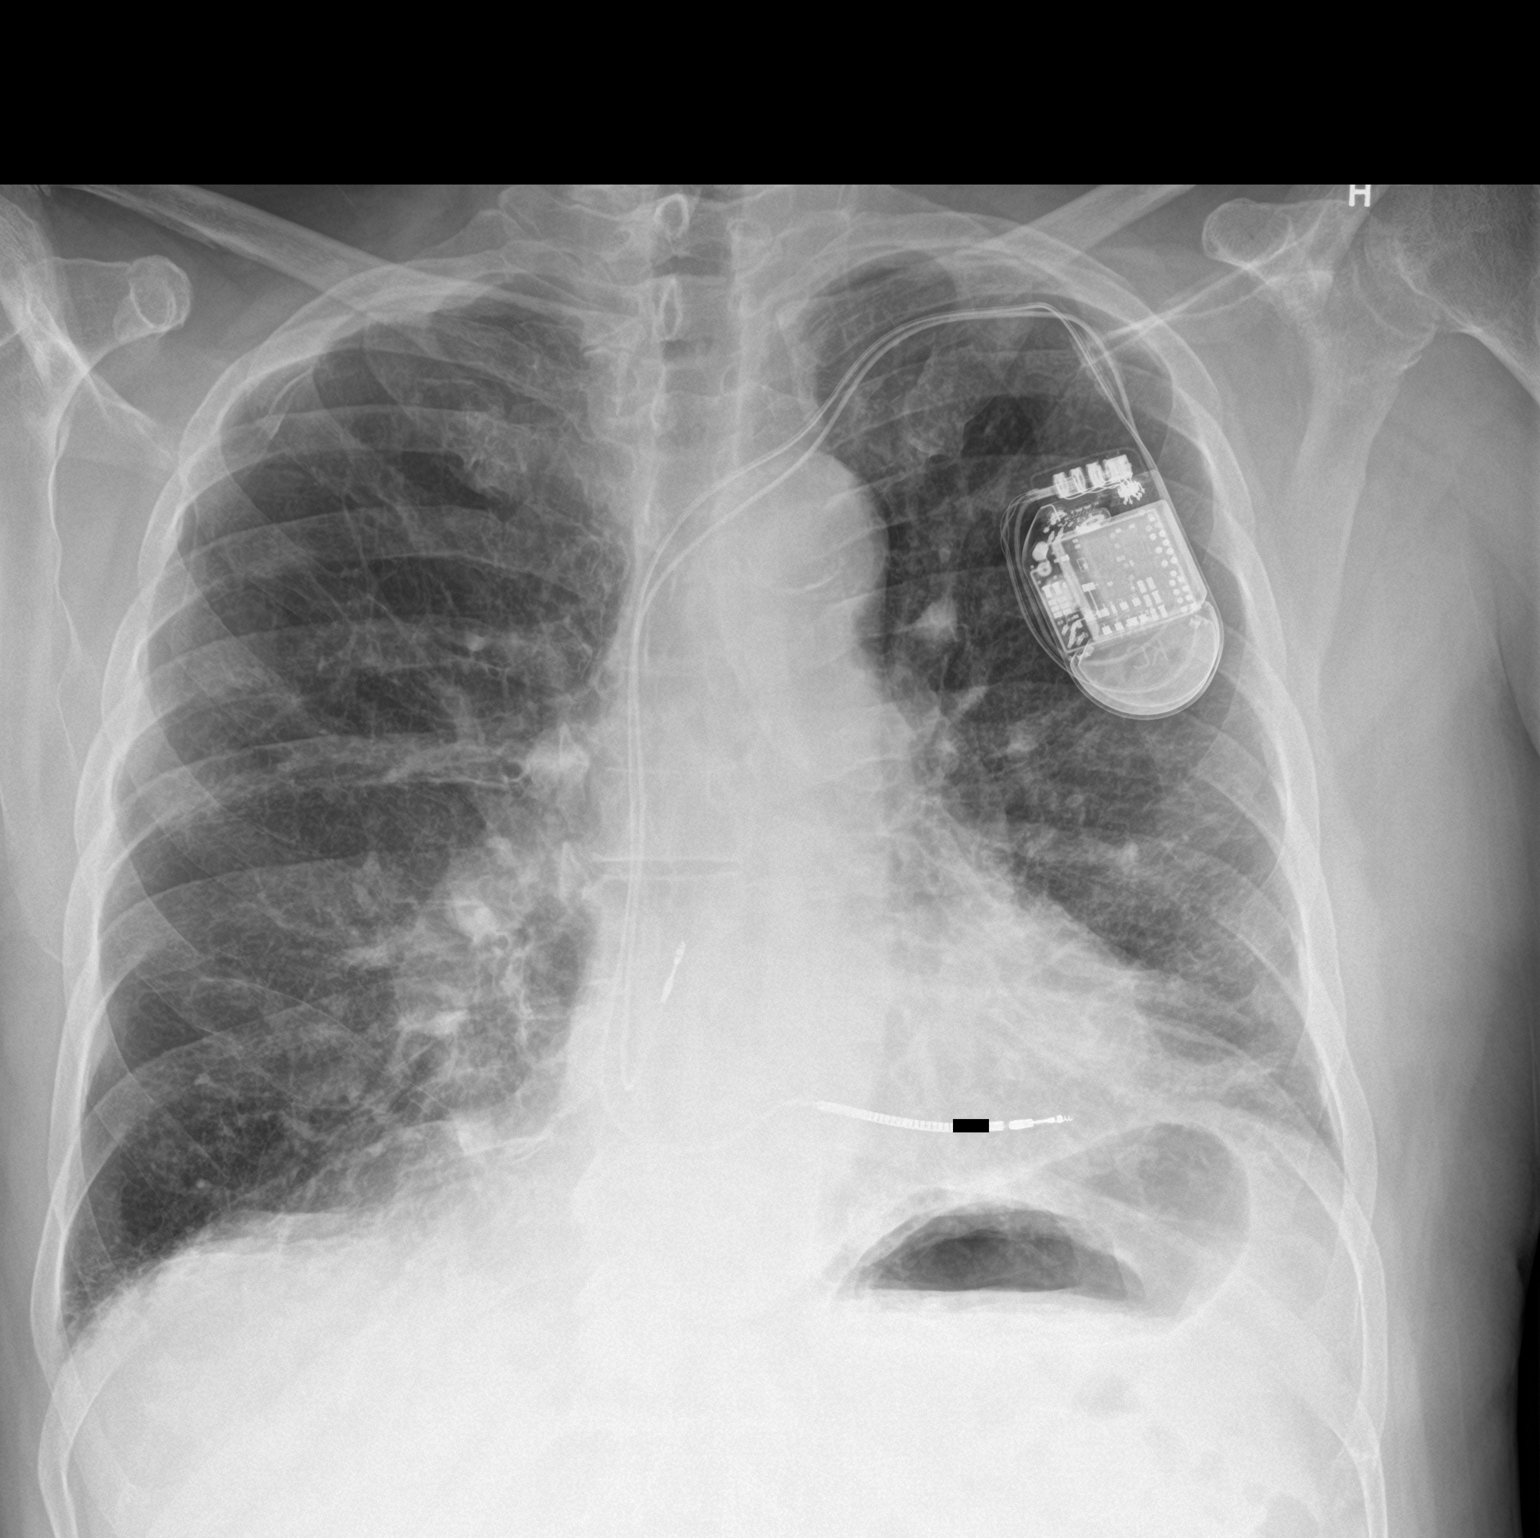

[chest lat]
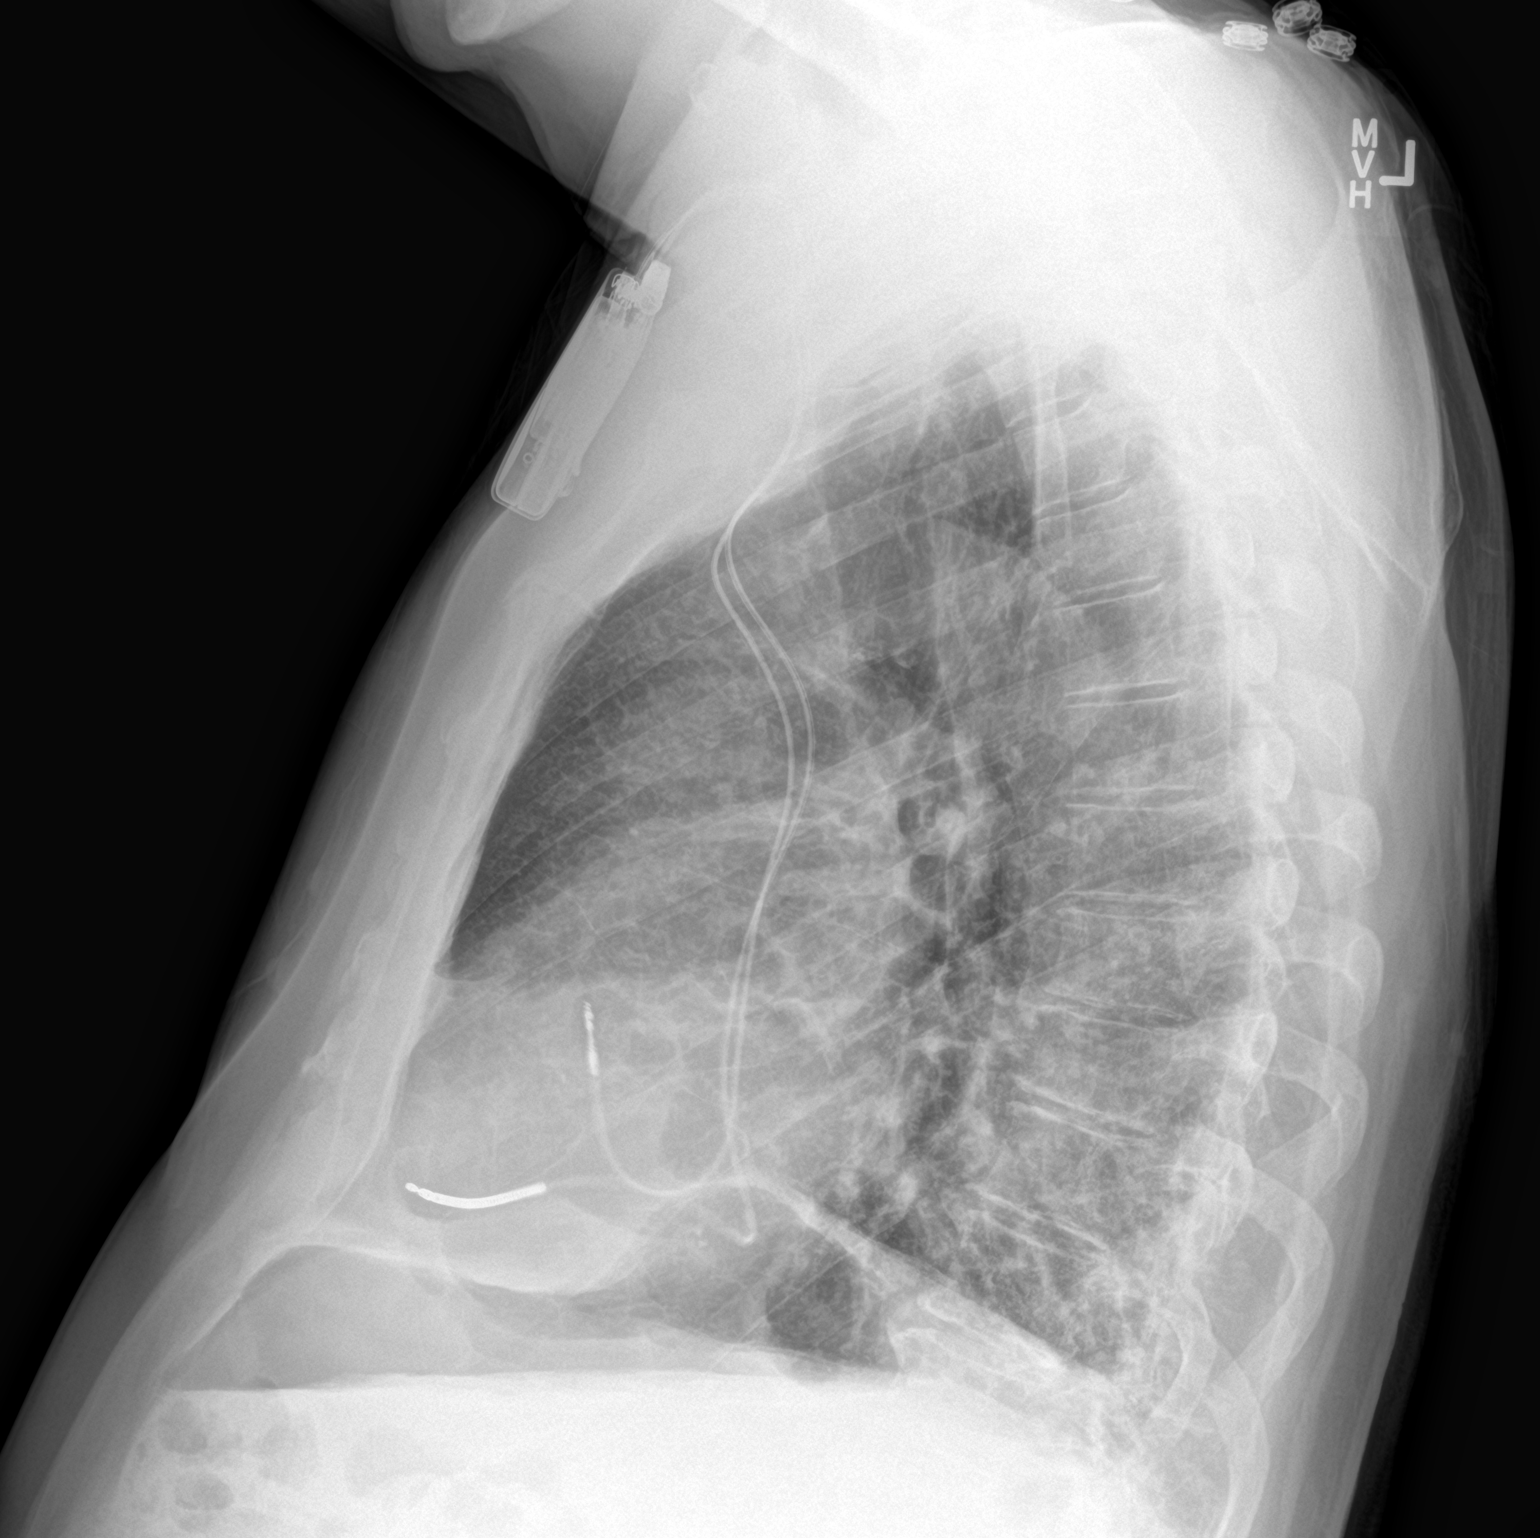

[2 of 2 positions shown; findings below may reference images not displayed]

FINDINGS: Cardiac shadow is stable. Defibrillator is again noted. Mild
interstitial changes are seen with basilar scarring. No focal
infiltrate is noted. Very mild interstitial edema is present. No
acute bony abnormality is seen.
IMPRESSION: Mild interstitial edema.

Chronic changes in the bases bilaterally.

## 2018-06-17 NOTE — Telephone Encounter (Signed)
LMTCB/sss  VT-1 episode treated successfully w/ATP.

## 2018-06-20 NOTE — Telephone Encounter (Signed)
Pt was returning [place] phone call.

## 2018-06-20 NOTE — Telephone Encounter (Signed)
Spoke to patient about VT-1 episode from 06/15/18 @ 2259 treated successfully w/ATP x1. Patient reports medication compliance. Patient denies any sx's. Patient aware of DMV driving restriction x 92months.  Will have SK to review the episode and call patient with any further recommendations. Patient verbalized understanding and stated that he would prefer for nothing to be changed since he feels great. Will make SK aware.

## 2018-06-21 ENCOUNTER — Encounter (HOSPITAL_COMMUNITY): Payer: PPO

## 2018-06-22 LAB — CUP PACEART REMOTE DEVICE CHECK
Battery Remaining Longevity: 31 mo
Battery Remaining Percentage: 31 %
Battery Voltage: 2.86 V
Brady Statistic AP VS Percent: 54 %
Brady Statistic AS VP Percent: 1 %
Brady Statistic RA Percent Paced: 53 %
HIGH POWER IMPEDANCE MEASURED VALUE: 79 Ohm
HighPow Impedance: 79 Ohm
Implantable Lead Implant Date: 20130213
Implantable Lead Location: 753859
Implantable Pulse Generator Implant Date: 20130213
Lead Channel Impedance Value: 390 Ohm
Lead Channel Impedance Value: 430 Ohm
Lead Channel Pacing Threshold Amplitude: 1 V
Lead Channel Pacing Threshold Pulse Width: 0.6 ms
Lead Channel Sensing Intrinsic Amplitude: 0.6 mV
Lead Channel Setting Pacing Amplitude: 2.5 V
Lead Channel Setting Pacing Pulse Width: 0.6 ms
MDC IDC LEAD IMPLANT DT: 20130213
MDC IDC LEAD LOCATION: 753860
MDC IDC MSMT LEADCHNL RA PACING THRESHOLD AMPLITUDE: 0.5 V
MDC IDC MSMT LEADCHNL RA PACING THRESHOLD PULSEWIDTH: 0.5 ms
MDC IDC MSMT LEADCHNL RV SENSING INTR AMPL: 12 mV
MDC IDC SESS DTM: 20190702154811
MDC IDC SET LEADCHNL RA PACING AMPLITUDE: 1.5 V
MDC IDC SET LEADCHNL RV SENSING SENSITIVITY: 0.5 mV
MDC IDC STAT BRADY AP VP PERCENT: 1 %
MDC IDC STAT BRADY AS VS PERCENT: 45 %
MDC IDC STAT BRADY RV PERCENT PACED: 1 %
Pulse Gen Serial Number: 7003419

## 2018-06-28 ENCOUNTER — Encounter (HOSPITAL_COMMUNITY): Payer: PPO

## 2018-07-05 ENCOUNTER — Encounter (HOSPITAL_COMMUNITY): Payer: PPO

## 2018-07-07 ENCOUNTER — Telehealth: Payer: Self-pay | Admitting: Cardiology

## 2018-07-07 NOTE — Telephone Encounter (Signed)
Spoke w/ pt and requested that he send a manual transmission b/c his home monitor has not updated in at least 7 days.   

## 2018-07-08 NOTE — Addendum Note (Signed)
Encounter addended by: Ivonne Andrew, RD on: 07/08/2018 9:09 AM  Actions taken: Flowsheet data copied forward, Visit Navigator Flowsheet section accepted

## 2018-07-12 ENCOUNTER — Encounter (HOSPITAL_COMMUNITY): Payer: PPO

## 2018-07-19 ENCOUNTER — Encounter (HOSPITAL_COMMUNITY): Payer: PPO

## 2018-07-22 ENCOUNTER — Other Ambulatory Visit: Payer: Self-pay | Admitting: Internal Medicine

## 2018-07-26 ENCOUNTER — Encounter (HOSPITAL_COMMUNITY): Payer: PPO

## 2018-07-26 ENCOUNTER — Telehealth: Payer: Self-pay | Admitting: Cardiology

## 2018-07-26 NOTE — Telephone Encounter (Signed)
Spoke w/ pt wife and requested that pt send a manual transmission b/c his home monitor has not updated in at least 7 days.   

## 2018-07-26 NOTE — Addendum Note (Signed)
Encounter addended by: Rowe Pavy, RN on: 07/26/2018 5:36 PM  Actions taken: Episode deleted, Flowsheet data copied forward, Visit Navigator Flowsheet section accepted, Sign clinical note

## 2018-07-26 NOTE — Progress Notes (Signed)
Discharge Progress Report  Patient Details  Name: Corey Tyler MRN: [id number] Date of Birth: 01-07-1941 Referring Provider:     Pulmonary Rehab Walk Test from 02/15/2018 in [place]  Referring Provider  Dr. Nelda Marseille       Number of Visits: 20 Reason for Discharge:  Patient reached a stable level of exercise. pt with competing chronic medical conditions that prevented him from returning to pulmonary rehab.  Smoking History:  Social History   Tobacco Use  Smoking Status Former Smoker  . Packs/day: 2.50  . Years: 40.00  . Pack years: 100.00  . Types: Cigarettes  . Last attempt to quit: 11/24/1983  . Years since quitting: 34.6  Smokeless Tobacco Never Used  Tobacco Comment   2 1/2 ppd x 40 years    Diagnosis:  Pulmonary emphysema, unspecified emphysema type ([place])  ADL [facility]: Pulmonary Assessment Scores    Row Name 02/17/18 1603 06/07/18 0951 06/07/18 0953     ADL [facility]SD   ADL Phase  -  -  Exit   SOB Score total  -  -  103     CAT Score   CAT Score  -  pre 22/ post 31  -     mMRC Score   mMRC Score  3  -  -      Initial Exercise Prescription: Initial Exercise Prescription - 02/17/18 1600      Date of Initial Exercise RX and Referring Provider   Date  02/17/18    Referring Provider  Dr. Nelda Marseille      Oxygen   Oxygen  Continuous    Liters  6      NuStep   Level  2    SPM  80    Minutes  17    METs  1.5      Arm Ergometer   Level  2    Watts  10    Minutes  17      Track   Laps  5    Minutes  17    METs  1      Prescription Details   Frequency (times per week)  2    Duration  Progress to 45 minutes of aerobic exercise without signs/symptoms of physical distress      Intensity   THRR 40-80% of Max Heartrate  57-114    Ratings of Perceived Exertion  11-13    Perceived Dyspnea  0-4      Progression   Progression  Continue progressive overload as per policy without signs/symptoms or physical distress.       Resistance Training   Training Prescription  Yes    Weight  blue bands    Reps  10-15       Discharge Exercise Prescription (Final Exercise Prescription Changes): Exercise Prescription Changes - 05/24/18 1444      Response to Exercise   Blood Pressure (Admit)  95/54    Blood Pressure (Exit)  102/58    Heart Rate (Admit)  68 bpm    Heart Rate (Exercise)  80 bpm    Heart Rate (Exit)  85 bpm    Oxygen Saturation (Admit)  91 %    Oxygen Saturation (Exercise)  89 %    Oxygen Saturation (Exit)  88 %    Rating of Perceived Exertion (Exercise)  13    Perceived Dyspnea (Exercise)  2    Duration  Progress to 45 minutes of aerobic exercise without signs/symptoms  of physical distress    Intensity  THRR unchanged      Progression   Progression  Continue to progress workloads to maintain intensity without signs/symptoms of physical distress.      Resistance Training   Training Prescription  Yes    Weight  blue bands    Reps  10-15    Time  10 Minutes      Interval Training   Interval Training  No      Oxygen   Oxygen  Continuous    Liters  3-4      NuStep   Level  4    SPM  80    Minutes  17    METs  2.4      Arm Ergometer   Level  5    Minutes  17      Track   Laps  12    Minutes  17       Functional Capacity: 6 Minute Walk    Row Name 02/17/18 1557         6 Minute Walk   Phase  Initial     Distance  1200 feet     Walk Time  6 minutes     # of Rest Breaks  0     MPH  2.27     METS  2.68     RPE  12     Perceived Dyspnea   1     Symptoms  No     Resting HR  66 bpm     Resting BP  108/67     Resting Oxygen Saturation   93 %     Exercise Oxygen Saturation  during 6 min walk  86 %     Max Ex. HR  87 bpm     Max Ex. BP  130/64       Interval HR   1 Minute HR  76     2 Minute HR  70     3 Minute HR  71     4 Minute HR  80     5 Minute HR  87     6 Minute HR  87     2 Minute Post HR  67     Interval Heart Rate?  Yes       Interval Oxygen    Interval Oxygen?  Yes     Baseline Oxygen Saturation %  93 %     1 Minute Oxygen Saturation %  88 %     1 Minute Liters of Oxygen  3 L     2 Minute Oxygen Saturation %  86 %     2 Minute Liters of Oxygen  4 L     3 Minute Oxygen Saturation %  86 %     3 Minute Liters of Oxygen  4 L     4 Minute Oxygen Saturation %  91 %     4 Minute Liters of Oxygen  4 L     5 Minute Oxygen Saturation %  87 %     5 Minute Liters of Oxygen  6 L     6 Minute Oxygen Saturation %  88 %     6 Minute Liters of Oxygen  6 L     2 Minute Post Oxygen Saturation %  95 %     2 Minute Post Liters of Oxygen  6 L  Psychological, QOL, Others - Outcomes: PHQ 2/9: Depression screen [facility] 2/9 02/11/2018 03/09/2016  Decreased Interest 0 0  Down, Depressed, Hopeless 0 0  PHQ - 2 Score 0 0  Some recent data might be hidden    Quality of Life:   Personal Goals: Goals established at orientation with interventions provided to work toward goal. Personal Goals and Risk Factors at Admission - 02/11/18 1432      Core Components/Risk Factors/Patient Goals on Admission   Improve shortness of breath with ADL's  Yes    Intervention  Provide education, individualized exercise plan and daily activity instruction to help decrease symptoms of SOB with activities of daily living.    Expected Outcomes  Short Term: Improve cardiorespiratory fitness to achieve a reduction of symptoms when performing ADLs;Long Term: Be able to perform more ADLs without symptoms or delay the onset of symptoms    Heart Failure  Yes    Intervention  Provide a combined exercise and nutrition program that is supplemented with education, support and counseling about heart failure. Directed toward relieving symptoms such as shortness of breath, decreased exercise tolerance, and extremity edema.    Expected Outcomes  Improve functional capacity of life;Short term: Attendance in program 2-3 days a week with increased exercise capacity. Reported lower sodium  intake. Reported increased fruit and vegetable intake. Reports medication compliance.;Short term: Daily weights obtained and reported for increase. Utilizing diuretic protocols set by physician.;Long term: Adoption of self-care skills and reduction of barriers for early signs and symptoms recognition and intervention leading to self-care maintenance.        Personal Goals Discharge: Goals and Risk Factor Review    Row Name 02/28/18 1641 03/28/18 1541 04/27/18 1440 05/24/18 1101 07/26/18 1729     Core Components/Risk Factors/Patient Goals Review   Personal Goals Review  Improve shortness of breath with ADL's;Develop more efficient breathing techniques such as purse lipped breathing and diaphragmatic breathing and practicing self-pacing with activity.;Heart Failure  Improve shortness of breath with ADL's;Develop more efficient breathing techniques such as purse lipped breathing and diaphragmatic breathing and practicing self-pacing with activity.;Heart Failure  Improve shortness of breath with ADL's;Develop more efficient breathing techniques such as purse lipped breathing and diaphragmatic breathing and practicing self-pacing with activity.;Heart Failure  Improve shortness of breath with ADL's;Heart Failure  Improve shortness of breath with ADL's;Heart Failure   Review  patient has only attended 2 exercise sessions since admission. too early to evaluate progression towards pulmonary rehab goals. will re-evaluate in 30 days and expect to see progress  patient has done well over the past 30 days in pulmonary rehab. he states he is beginning to see an improvement in his shortness of breath with his exertional activities at home and with exercise. he is learning the PLB and pacing technique and beginning to use them independently with cueing. his heartfailure is well managed with medications. he verbalizes weighing himself daily and understanding when to notify MD with weight gain. he has gained 2.4 kg since  admission but there is no evidence of fluid overload or edema. will continue to monitor progression towards pulmonary rehab goals over the next 30 days.  patient has done well over the past 30 days in pulmonary rehab. Pt with one ER Visit for his complaint of feeling weak, pt cxray showed Pnuemonia.  Pt sees improvement in his shortness of breath with his exertional activities at home and with exercise. Patrick Jupiter is learning the PLB and pacing technique and has been observed to use them  independently. Pt  heartfailure is well managed with medications. He verbalizes weighing himself daily and understanding when to notify MD with weight gain. he has gained 2.1 kg since admission but there is no evidence of fluid overload or edema. will continue to monitor progression towards pulmonary rehab goals over the next 30 days.  Absent x 1 week due to hypotension and "feeling bad", back to class last week and tolerated well.  Progressed to level 5 on arm ergometer, 11 laps on track, and level 4 on nustep  pt discharged from pulmonary rehab with the completion of 20 sessions.  Pt with complex medical issues along with chronic lung condition is a barrier to making progress with his sob and heart failure   Expected Outcomes  see admission expected outcomes.  see admission expected outcomes.  see admission expected outcomes.  see admission expected outcomes.  Pt made progress toward admission goals and outcomes limited by the chronic and advancing lung disease      Exercise Goals and Review: Exercise Goals    Row Name 02/11/18 1411             Exercise Goals   Increase Physical Activity  Yes       Intervention  Provide advice, education, support and counseling about physical activity/exercise needs.;Develop an individualized exercise prescription for aerobic and resistive training based on initial evaluation findings, risk stratification, comorbidities and participant's personal goals.       Expected Outcomes  Long  Term: Add in home exercise to make exercise part of routine and to increase amount of physical activity.;Long Term: Exercising regularly at least 3-5 days a week.       Increase Strength and Stamina  Yes       Intervention  Develop an individualized exercise prescription for aerobic and resistive training based on initial evaluation findings, risk stratification, comorbidities and participant's personal goals.;Provide advice, education, support and counseling about physical activity/exercise needs.       Expected Outcomes  Short Term: Perform resistance training exercises routinely during rehab and add in resistance training at home;Long Term: Improve cardiorespiratory fitness, muscular endurance and strength as measured by increased METs and functional capacity (6MWT);Short Term: Increase workloads from initial exercise prescription for resistance, speed, and METs.       Able to understand and use rate of perceived exertion (RPE) scale  Yes       Intervention  Provide education and explanation on how to use RPE scale       Expected Outcomes  Short Term: Able to use RPE daily in rehab to express subjective intensity level;Long Term:  Able to use RPE to guide intensity level when exercising independently       Able to understand and use Dyspnea scale  Yes       Intervention  Provide education and explanation on how to use Dyspnea scale       Expected Outcomes  Short Term: Able to use Dyspnea scale daily in rehab to express subjective sense of shortness of breath during exertion;Long Term: Able to use Dyspnea scale to guide intensity level when exercising independently       Knowledge and understanding of Target Heart Rate Range (THRR)  Yes       Intervention  Provide education and explanation of THRR including how the numbers were predicted and where they are located for reference       Expected Outcomes  Short Term: Able to use daily as guideline for intensity in rehab;Short  Term: Able to state/look up  THRR;Long Term: Able to use THRR to govern intensity when exercising independently       Understanding of Exercise Prescription  Yes       Intervention  Provide education, explanation, and written materials on patient's individual exercise prescription       Expected Outcomes  Short Term: Able to explain program exercise prescription;Long Term: Able to explain home exercise prescription to exercise independently          Nutrition & Weight - Outcomes: Pre Biometrics - 04/19/18 1608      Pre Biometrics   Weight  103.7 kg    BMI (Calculated)  30.17        Nutrition: Nutrition Therapy & Goals - 04/01/18 1328      Nutrition Therapy   Diet  Heart Healthy      Personal Nutrition Goals   Nutrition Goal  Pt to increase fruit and vegetable consumption by aiming for 1 serving of fruit and/or vegetable at each meal.    Personal Goal #2  Pt to consider eating a meatless meal at least 2 times/week.      Intervention Plan   Intervention  Prescribe, educate and counsel regarding individualized specific dietary modifications aiming towards targeted core components such as weight, hypertension, lipid management, diabetes, heart failure and other comorbidities.    Expected Outcomes  Short Term Goal: Understand basic principles of dietary content, such as calories, fat, sodium, cholesterol and nutrients.;Long Term Goal: Adherence to prescribed nutrition plan.       Nutrition Discharge: Nutrition Assessments - 06/08/18 0932      Rate Your Plate Scores   Pre Score  50    Post Score  40       Education Questionnaire Score: Knowledge Questionnaire Score - 06/07/18 0949      Knowledge Questionnaire Score   Pre Score  16/18    Post Score  16/18       Pt did not return for any post assessment evaluations. Cherre Huger, BSN Cardiac and Training and development officer

## 2018-08-02 ENCOUNTER — Encounter (HOSPITAL_COMMUNITY): Payer: PPO

## 2018-08-08 ENCOUNTER — Telehealth: Payer: Self-pay | Admitting: *Deleted

## 2018-08-08 ENCOUNTER — Telehealth: Payer: Self-pay | Admitting: Cardiology

## 2018-08-08 NOTE — Telephone Encounter (Signed)
Spoke w/ pt and requested that he send a manual transmission b/c his home monitor has not updated in at least 7 days.   

## 2018-08-08 NOTE — Telephone Encounter (Signed)
Corey Tyler from [facility] communicated to the [facility] this morning that Corey Tyler ICD is in RF Timeout and that he should have the cyber security upgrade and they ([employer] rep) will accommodate any time he can come into the office. LMOM to return call to the [facility].

## 2018-08-08 NOTE — Telephone Encounter (Signed)
thx

## 2018-08-09 ENCOUNTER — Encounter (HOSPITAL_COMMUNITY): Payer: PPO

## 2018-08-09 ENCOUNTER — Ambulatory Visit (INDEPENDENT_AMBULATORY_CARE_PROVIDER_SITE_OTHER): Payer: PPO | Admitting: *Deleted

## 2018-08-09 DIAGNOSIS — I255 Ischemic cardiomyopathy: Secondary | ICD-10-CM

## 2018-08-09 DIAGNOSIS — I5022 Chronic systolic (congestive) heart failure: Secondary | ICD-10-CM

## 2018-08-15 NOTE — Progress Notes (Signed)
Device check in clinic by industry for software upgrade. See scanned report for details.  <1% AT/AF burden, max dur. 16mins. (1) VT-NS episode since 07/26/18. Industry helped patient with monitor. Monitor is now connected. Follow up: [facility] on 08/23/18.

## 2018-08-16 ENCOUNTER — Encounter (HOSPITAL_COMMUNITY): Payer: PPO

## 2018-08-23 ENCOUNTER — Ambulatory Visit (INDEPENDENT_AMBULATORY_CARE_PROVIDER_SITE_OTHER): Payer: PPO | Admitting: *Deleted

## 2018-08-23 ENCOUNTER — Encounter (HOSPITAL_COMMUNITY): Payer: PPO

## 2018-08-23 DIAGNOSIS — I255 Ischemic cardiomyopathy: Secondary | ICD-10-CM | POA: Diagnosis not present

## 2018-08-23 DIAGNOSIS — I5022 Chronic systolic (congestive) heart failure: Secondary | ICD-10-CM

## 2018-08-23 NOTE — Progress Notes (Signed)
Remote ICD transmission.   

## 2018-08-30 ENCOUNTER — Encounter (HOSPITAL_COMMUNITY): Payer: PPO

## 2018-09-06 ENCOUNTER — Encounter (HOSPITAL_COMMUNITY): Payer: PPO

## 2018-09-13 ENCOUNTER — Encounter (HOSPITAL_COMMUNITY): Payer: PPO

## 2018-09-15 LAB — CUP PACEART REMOTE DEVICE CHECK
Battery Remaining Percentage: 30 %
Brady Statistic AS VS Percent: 38 %
Brady Statistic RA Percent Paced: 59 %
Brady Statistic RV Percent Paced: 1.5 %
Date Time Interrogation Session: 20191001060016
HIGH POWER IMPEDANCE MEASURED VALUE: 75 Ohm
HighPow Impedance: 75 Ohm
Implantable Lead Implant Date: 20130213
Implantable Lead Location: 753859
Implantable Pulse Generator Implant Date: 20130213
Lead Channel Impedance Value: 390 Ohm
Lead Channel Pacing Threshold Amplitude: 0.75 V
Lead Channel Pacing Threshold Pulse Width: 0.6 ms
Lead Channel Sensing Intrinsic Amplitude: 12 mV
Lead Channel Setting Pacing Amplitude: 1.5 V
Lead Channel Setting Pacing Amplitude: 2.5 V
Lead Channel Setting Pacing Pulse Width: 0.6 ms
MDC IDC LEAD IMPLANT DT: 20130213
MDC IDC LEAD LOCATION: 753860
MDC IDC MSMT BATTERY REMAINING LONGEVITY: 29 mo
MDC IDC MSMT BATTERY VOLTAGE: 2.84 V
MDC IDC MSMT LEADCHNL RA IMPEDANCE VALUE: 450 Ohm
MDC IDC MSMT LEADCHNL RA PACING THRESHOLD AMPLITUDE: 0.5 V
MDC IDC MSMT LEADCHNL RA PACING THRESHOLD PULSEWIDTH: 0.5 ms
MDC IDC MSMT LEADCHNL RA SENSING INTR AMPL: 2.7 mV
MDC IDC SET LEADCHNL RV SENSING SENSITIVITY: 0.5 mV
MDC IDC STAT BRADY AP VP PERCENT: 1.4 %
MDC IDC STAT BRADY AP VS PERCENT: 59 %
MDC IDC STAT BRADY AS VP PERCENT: 1 %
Pulse Gen Serial Number: 7003419

## 2018-09-20 ENCOUNTER — Encounter (HOSPITAL_COMMUNITY): Payer: PPO

## 2018-09-23 ENCOUNTER — Other Ambulatory Visit: Payer: Self-pay

## 2018-09-23 MED ORDER — RANOLAZINE ER 1000 MG PO TB12: 1000.0000 mg | ORAL_TABLET | Freq: Two times a day (BID) | ORAL | 3 refills | Status: DC

## 2018-09-27 ENCOUNTER — Encounter (HOSPITAL_COMMUNITY): Payer: PPO

## 2018-11-22 ENCOUNTER — Ambulatory Visit (INDEPENDENT_AMBULATORY_CARE_PROVIDER_SITE_OTHER): Payer: PPO

## 2018-11-22 DIAGNOSIS — I255 Ischemic cardiomyopathy: Secondary | ICD-10-CM

## 2018-11-22 DIAGNOSIS — I5022 Chronic systolic (congestive) heart failure: Secondary | ICD-10-CM

## 2018-11-22 LAB — CUP PACEART REMOTE DEVICE CHECK
Battery Remaining Longevity: 26 mo
Brady Statistic AP VS Percent: 63 %
Brady Statistic AS VP Percent: 1 %
Brady Statistic RV Percent Paced: 1.4 %
HIGH POWER IMPEDANCE MEASURED VALUE: 73 Ohm
HighPow Impedance: 73 Ohm
Implantable Lead Location: 753860
Lead Channel Impedance Value: 410 Ohm
Lead Channel Pacing Threshold Pulse Width: 0.5 ms
Lead Channel Sensing Intrinsic Amplitude: 2.1 mV
Lead Channel Setting Pacing Amplitude: 1.5 V
Lead Channel Setting Sensing Sensitivity: 0.5 mV
MDC IDC LEAD IMPLANT DT: 20130213
MDC IDC LEAD IMPLANT DT: 20130213
MDC IDC LEAD LOCATION: 753859
MDC IDC MSMT BATTERY REMAINING PERCENTAGE: 27 %
MDC IDC MSMT BATTERY VOLTAGE: 2.83 V
MDC IDC MSMT LEADCHNL RA PACING THRESHOLD AMPLITUDE: 0.5 V
MDC IDC MSMT LEADCHNL RV IMPEDANCE VALUE: 430 Ohm
MDC IDC MSMT LEADCHNL RV PACING THRESHOLD AMPLITUDE: 0.75 V
MDC IDC MSMT LEADCHNL RV PACING THRESHOLD PULSEWIDTH: 0.6 ms
MDC IDC MSMT LEADCHNL RV SENSING INTR AMPL: 12 mV
MDC IDC PG IMPLANT DT: 20130213
MDC IDC PG SERIAL: 7003419
MDC IDC SESS DTM: 20191231070016
MDC IDC SET LEADCHNL RV PACING AMPLITUDE: 2.5 V
MDC IDC SET LEADCHNL RV PACING PULSEWIDTH: 0.6 ms
MDC IDC STAT BRADY AP VP PERCENT: 1.3 %
MDC IDC STAT BRADY AS VS PERCENT: 35 %
MDC IDC STAT BRADY RA PERCENT PACED: 62 %

## 2018-11-22 NOTE — Progress Notes (Signed)
Remote ICD transmission.   

## 2018-12-13 DIAGNOSIS — H1851 Endothelial corneal dystrophy: Secondary | ICD-10-CM | POA: Diagnosis not present

## 2018-12-13 DIAGNOSIS — H35033 Hypertensive retinopathy, bilateral: Secondary | ICD-10-CM | POA: Diagnosis not present

## 2018-12-13 DIAGNOSIS — H40013 Open angle with borderline findings, low risk, bilateral: Secondary | ICD-10-CM | POA: Diagnosis not present

## 2018-12-13 DIAGNOSIS — H01003 Unspecified blepharitis right eye, unspecified eyelid: Secondary | ICD-10-CM | POA: Diagnosis not present

## 2018-12-21 DIAGNOSIS — Z125 Encounter for screening for malignant neoplasm of prostate: Secondary | ICD-10-CM | POA: Diagnosis not present

## 2018-12-21 DIAGNOSIS — K219 Gastro-esophageal reflux disease without esophagitis: Secondary | ICD-10-CM | POA: Diagnosis not present

## 2018-12-21 DIAGNOSIS — I10 Essential (primary) hypertension: Secondary | ICD-10-CM | POA: Diagnosis not present

## 2018-12-21 DIAGNOSIS — R918 Other nonspecific abnormal finding of lung field: Secondary | ICD-10-CM | POA: Diagnosis not present

## 2018-12-21 DIAGNOSIS — Z87898 Personal history of other specified conditions: Secondary | ICD-10-CM | POA: Diagnosis not present

## 2018-12-21 DIAGNOSIS — I2510 Atherosclerotic heart disease of native coronary artery without angina pectoris: Secondary | ICD-10-CM | POA: Diagnosis not present

## 2018-12-21 DIAGNOSIS — I5022 Chronic systolic (congestive) heart failure: Secondary | ICD-10-CM | POA: Diagnosis not present

## 2018-12-21 DIAGNOSIS — R739 Hyperglycemia, unspecified: Secondary | ICD-10-CM | POA: Diagnosis not present

## 2018-12-21 DIAGNOSIS — E039 Hypothyroidism, unspecified: Secondary | ICD-10-CM | POA: Diagnosis not present

## 2018-12-21 DIAGNOSIS — E785 Hyperlipidemia, unspecified: Secondary | ICD-10-CM | POA: Diagnosis not present

## 2019-02-21 ENCOUNTER — Other Ambulatory Visit: Payer: Self-pay

## 2019-02-21 ENCOUNTER — Ambulatory Visit (INDEPENDENT_AMBULATORY_CARE_PROVIDER_SITE_OTHER): Payer: PPO | Admitting: *Deleted

## 2019-02-21 DIAGNOSIS — I255 Ischemic cardiomyopathy: Secondary | ICD-10-CM | POA: Diagnosis not present

## 2019-02-21 LAB — CUP PACEART REMOTE DEVICE CHECK
Battery Remaining Longevity: 23 mo
Battery Remaining Percentage: 24 %
Brady Statistic AP VP Percent: 1.2 %
Brady Statistic AP VS Percent: 64 %
Brady Statistic AS VS Percent: 34 %
Brady Statistic RV Percent Paced: 1.3 %
Date Time Interrogation Session: 20200331060014
HIGH POWER IMPEDANCE MEASURED VALUE: 81 Ohm
HighPow Impedance: 81 Ohm
Implantable Lead Implant Date: 20130213
Implantable Lead Location: 753859
Lead Channel Impedance Value: 400 Ohm
Lead Channel Pacing Threshold Amplitude: 0.5 V
Lead Channel Pacing Threshold Pulse Width: 0.6 ms
Lead Channel Sensing Intrinsic Amplitude: 1.6 mV
Lead Channel Setting Pacing Amplitude: 2.5 V
MDC IDC LEAD IMPLANT DT: 20130213
MDC IDC LEAD LOCATION: 753860
MDC IDC MSMT BATTERY VOLTAGE: 2.8 V
MDC IDC MSMT LEADCHNL RA IMPEDANCE VALUE: 440 Ohm
MDC IDC MSMT LEADCHNL RA PACING THRESHOLD PULSEWIDTH: 0.5 ms
MDC IDC MSMT LEADCHNL RV PACING THRESHOLD AMPLITUDE: 0.75 V
MDC IDC MSMT LEADCHNL RV SENSING INTR AMPL: 12 mV
MDC IDC PG IMPLANT DT: 20130213
MDC IDC SET LEADCHNL RA PACING AMPLITUDE: 1.5 V
MDC IDC SET LEADCHNL RV PACING PULSEWIDTH: 0.6 ms
MDC IDC SET LEADCHNL RV SENSING SENSITIVITY: 0.5 mV
MDC IDC STAT BRADY AS VP PERCENT: 1 %
MDC IDC STAT BRADY RA PERCENT PACED: 64 %
Pulse Gen Serial Number: 7003419

## 2019-03-01 ENCOUNTER — Encounter: Payer: Self-pay | Admitting: Cardiology

## 2019-03-01 NOTE — Progress Notes (Signed)
Remote ICD transmission.   

## 2019-04-13 ENCOUNTER — Emergency Department (HOSPITAL_COMMUNITY): Payer: PPO

## 2019-04-13 ENCOUNTER — Encounter (HOSPITAL_COMMUNITY): Payer: Self-pay | Admitting: Emergency Medicine

## 2019-04-13 ENCOUNTER — Emergency Department (HOSPITAL_COMMUNITY)
Admission: EM | Admit: 2019-04-13 | Discharge: 2019-04-13 | Disposition: A | Discharge status: Home / Self Care | Payer: PPO | Attending: Emergency Medicine | Admitting: Emergency Medicine

## 2019-04-13 ENCOUNTER — Other Ambulatory Visit: Payer: Self-pay

## 2019-04-13 ENCOUNTER — Telehealth (INDEPENDENT_AMBULATORY_CARE_PROVIDER_SITE_OTHER): Payer: PPO | Admitting: Internal Medicine

## 2019-04-13 VITALS — BP 126/71 | HR 73 | Ht 73.0 in | Wt 221.0 lb

## 2019-04-13 DIAGNOSIS — R0600 Dyspnea, unspecified: Secondary | ICD-10-CM

## 2019-04-13 DIAGNOSIS — Z03818 Encounter for observation for suspected exposure to other biological agents ruled out: Secondary | ICD-10-CM | POA: Insufficient documentation

## 2019-04-13 DIAGNOSIS — I252 Old myocardial infarction: Secondary | ICD-10-CM | POA: Diagnosis not present

## 2019-04-13 DIAGNOSIS — Z20828 Contact with and (suspected) exposure to other viral communicable diseases: Secondary | ICD-10-CM | POA: Diagnosis not present

## 2019-04-13 DIAGNOSIS — Z7901 Long term (current) use of anticoagulants: Secondary | ICD-10-CM | POA: Diagnosis not present

## 2019-04-13 DIAGNOSIS — J441 Chronic obstructive pulmonary disease with (acute) exacerbation: Secondary | ICD-10-CM | POA: Diagnosis not present

## 2019-04-13 DIAGNOSIS — R0602 Shortness of breath: Secondary | ICD-10-CM | POA: Diagnosis not present

## 2019-04-13 DIAGNOSIS — I255 Ischemic cardiomyopathy: Secondary | ICD-10-CM | POA: Diagnosis not present

## 2019-04-13 DIAGNOSIS — Z950 Presence of cardiac pacemaker: Secondary | ICD-10-CM | POA: Diagnosis not present

## 2019-04-13 DIAGNOSIS — I5022 Chronic systolic (congestive) heart failure: Secondary | ICD-10-CM | POA: Diagnosis not present

## 2019-04-13 DIAGNOSIS — Z79899 Other long term (current) drug therapy: Secondary | ICD-10-CM | POA: Insufficient documentation

## 2019-04-13 DIAGNOSIS — I472 Ventricular tachycardia, unspecified: Secondary | ICD-10-CM

## 2019-04-13 DIAGNOSIS — Z20822 Contact with and (suspected) exposure to covid-19: Secondary | ICD-10-CM

## 2019-04-13 DIAGNOSIS — I110 Hypertensive heart disease with heart failure: Secondary | ICD-10-CM | POA: Diagnosis not present

## 2019-04-13 DIAGNOSIS — Z91040 Latex allergy status: Secondary | ICD-10-CM | POA: Insufficient documentation

## 2019-04-13 DIAGNOSIS — Z87891 Personal history of nicotine dependence: Secondary | ICD-10-CM | POA: Diagnosis not present

## 2019-04-13 DIAGNOSIS — Z95810 Presence of automatic (implantable) cardiac defibrillator: Secondary | ICD-10-CM

## 2019-04-13 LAB — BASIC METABOLIC PANEL
Anion gap: 10 (ref 5–15)
BUN: 17 mg/dL (ref 8–23)
CO2: 28 mmol/L (ref 22–32)
Calcium: 8.7 mg/dL — ABNORMAL LOW (ref 8.9–10.3)
Chloride: 101 mmol/L (ref 98–111)
Creatinine, Ser: 1.67 mg/dL — ABNORMAL HIGH (ref 0.61–1.24)
GFR calc Af Amer: 44 mL/min — ABNORMAL LOW (ref >60)
GFR calc non Af Amer: 38 mL/min — ABNORMAL LOW (ref >60)
Glucose, Bld: 119 mg/dL — ABNORMAL HIGH (ref 70–99)
Potassium: 3.8 mmol/L (ref 3.5–5.1)
Sodium: 139 mmol/L (ref 135–145)

## 2019-04-13 LAB — CBC WITH DIFFERENTIAL/PLATELET
Abs Immature Granulocytes: 0.03 10*3/uL (ref 0.00–0.07)
Basophils Absolute: 0.1 10*3/uL (ref 0.0–0.1)
Basophils Relative: 1 %
Eosinophils Absolute: 0.1 10*3/uL (ref 0.0–0.5)
Eosinophils Relative: 2 %
HCT: 40.2 % (ref 39.0–52.0)
Hemoglobin: 13.2 g/dL (ref 13.0–17.0)
Immature Granulocytes: 0 %
Lymphocytes Relative: 30 %
Lymphs Abs: 2.1 10*3/uL (ref 0.7–4.0)
MCH: 32.4 pg (ref 26.0–34.0)
MCHC: 32.8 g/dL (ref 30.0–36.0)
MCV: 98.8 fL (ref 80.0–100.0)
Monocytes Absolute: 0.6 10*3/uL (ref 0.1–1.0)
Monocytes Relative: 9 %
Neutro Abs: 4.1 10*3/uL (ref 1.7–7.7)
Neutrophils Relative %: 58 %
Platelets: 183 10*3/uL (ref 150–400)
RBC: 4.07 MIL/uL — ABNORMAL LOW (ref 4.22–5.81)
RDW: 14.2 % (ref 11.5–15.5)
WBC: 7.1 10*3/uL (ref 4.0–10.5)
nRBC: 0.3 % — ABNORMAL HIGH (ref 0.0–0.2)

## 2019-04-13 LAB — SARS CORONAVIRUS 2 BY RT PCR (HOSPITAL ORDER, PERFORMED IN ~~LOC~~ HOSPITAL LAB): SARS Coronavirus 2: NEGATIVE

## 2019-04-13 LAB — BRAIN NATRIURETIC PEPTIDE: B Natriuretic Peptide: 111.1 pg/mL — ABNORMAL HIGH (ref 0.0–100.0)

## 2019-04-13 LAB — TROPONIN I: Troponin I: <0.03 ng/mL (ref <0.03)

## 2019-04-13 MED ORDER — DOXYCYCLINE HYCLATE 100 MG PO TABS
100.0000 mg | ORAL_TABLET | Freq: Once | ORAL | Status: AC
  Administered 2019-04-13: 100 mg via ORAL
  Filled 2019-04-13: qty 1

## 2019-04-13 MED ORDER — PREDNISONE 10 MG (21) PO TBPK: ORAL_TABLET | ORAL | 0 refills | Status: DC

## 2019-04-13 MED ORDER — PREDNISONE 20 MG PO TABS
60.0000 mg | ORAL_TABLET | Freq: Once | ORAL | Status: AC
  Administered 2019-04-13: 60 mg via ORAL
  Filled 2019-04-13: qty 3

## 2019-04-13 MED ORDER — DOXYCYCLINE HYCLATE 100 MG PO CAPS: 100.0000 mg | ORAL_CAPSULE | Freq: Two times a day (BID) | ORAL | 0 refills | Status: DC

## 2019-04-13 NOTE — Progress Notes (Signed)
Electrophysiology TeleHealth Note   Due to national recommendations of social distancing due to COVID 19, an audio/video telehealth visit is felt to be most appropriate for this patient at this time.  See MyChart message from today for the patient's consent to telehealth for [facility].   Date:  04/13/2019   ID:  Corey, Tyler 03/28/1941, MRN [id number]  Location: patient's home  Provider location: [address] [place]  Evaluation Performed: Follow-up visit  PCP:  [name], MD  Cardiologist:  [facility] Electrophysiologist:  SK   Chief Complaint:  SOB  History of Present Illness:    Corey Tyler is a 78 y.o. male who presents via audio/video conferencing for a telehealth visit today. The patient did not have access to video technology/had technical difficulties with video requiring transitioning to audio format only (telephone).  All issues noted in this document were discussed and addressed.  No physical exam could be performed with this format.     Since last being seen in our clinic for NICM with implanted ICD, Afib and recurrent VT*  the patient reports being exceptionally SOB today; coughing      Past Medical History:  Diagnosis Date  . A-fib ([place])    a. Noted on 12/2012 interrogation, placed on Apixaban and ultimately stopped NOACs 2/2 personal decision 8/14.  Marland Kitchen AICD (automatic cardioverter/defibrillator) present    Cardiac defibrillator -dual  [employer]  . Arthritis   . Atrial tachycardia-non sustained    a. Noted on 03/2012 interrogation.  . Chronic systolic heart failure ([facility])    EF down to 20 to 25% per echo November 2012  . COPD (chronic obstructive pulmonary disease) ([place])   . Hyperlipidemia   . Hypertension   . ICD (implantable cardiac defibrillator) in place   . NICM (nonischemic cardiomyopathy) ([place])    a. Normal cors 2000. b. Minimal plaque 2013.  . Old myocardial infarction    a. ?Silent MI. b. Large fixed inferolateral defect c/w  with prior infarct, no ischemia. c. Cath in 2000/2013 with only minimal CAD.  Marland Kitchen Presence of permanent cardiac pacemaker   . Pulmonary nodule, right    Last scan in 2010 showing stability; felt to be benign.  Marland Kitchen PVC's (premature ventricular contractions)   . Ventricular tachycardia, polymorphic ([place])    Rx via ICD 12/14    Past Surgical History:  Procedure Laterality Date  . APPENDECTOMY    . CARDIAC CATHETERIZATION  2000  . CARDIAC DEFIBRILLATOR PLACEMENT    . CARDIOVERSION  03/30/2013   Procedure: CARDIOVERSION;  Surgeon: Thayer Headings, MD;  Location: [facility];  Service: Cardiovascular;;  . COLON SURGERY    . COLONOSCOPY N/A 09/25/2013   Procedure: COLONOSCOPY;  Surgeon: Jerene Bears, MD;  Location: [facility]OS[facility];  Service: Endoscopy;  Laterality: N/A;  . ICD  12/2011   [name]  . IMPLANTABLE CARDIOVERTER DEFIBRILLATOR IMPLANT N/A 01/06/2012   Procedure: IMPLANTABLE CARDIOVERTER DEFIBRILLATOR IMPLANT;  Surgeon: Deboraha Sprang, MD;  Location: [facility] CATH LAB;  Service: Cardiovascular;  Laterality: N/A;  . PACEMAKER IMPLANT     [employer] [name]  . TEE WITHOUT CARDIOVERSION N/A 03/30/2013   Procedure: TRANSESOPHAGEAL ECHOCARDIOGRAM (TEE);  Surgeon: Thayer Headings, MD;  Location: [facility];  Service: Cardiovascular;  Laterality: N/A;  . V TACH ABLATION  06/10/2017  . V TACH ABLATION N/A 06/10/2017   Procedure: V Tach Ablation;  Surgeon: Evans Lance, MD;  Location: [facility] CV LAB;  Service: Cardiovascular;  Laterality: N/A;    Current Outpatient Medications  Medication Sig Dispense Refill  . Albuterol Sulfate (PROAIR RESPICLICK) 426 (90 Base) MCG/ACT AEPB Inhale 2 puffs into the lungs every 6 (six) hours as needed (shortness of breath/ wheezing).    Marland Kitchen amiodarone (PACERONE) 200 MG tablet Take 2 tablets (400 mg total) by mouth daily. Take 400mg  by mouth daily for 1 month, then resume 200mg  by mouth daily dose. (Patient taking differently: Take 200 mg by mouth daily. ) 60 tablet 0  .  Carboxymethylcellul-Glycerin (LUBRICATING EYE DROPS OP) Apply 1 drop to eye daily as needed (dry eyes).    . cholecalciferol (VITAMIN D) 1000 UNITS tablet Take 1,000 Units by mouth daily.    Marland Kitchen ELIQUIS 5 MG TABS tablet TAKE 1 TABLET BY MOUTH TWICE A DAY 60 tablet 5  . finasteride (PROSCAR) 5 MG tablet Take 5 mg by mouth daily.     . fluticasone (FLONASE) 50 MCG/ACT nasal spray Place 1 spray into both nostrils daily as needed for allergies.     . Fluticasone-Salmeterol (ADVAIR DISKUS) 250-50 MCG/DOSE AEPB Inhale 1 puff into the lungs 2 (two) times daily.    Marland Kitchen levothyroxine (SYNTHROID, LEVOTHROID) 125 MCG tablet Take 125 mcg by mouth daily.    . Melatonin 3 MG TABS Take 3 mg by mouth at bedtime.    . metoprolol tartrate (LOPRESSOR) 25 MG tablet Take 1 tablet (25 mg total) by mouth 2 (two) times daily. 60 tablet 0  . Multiple Vitamin (MULTIVITAMIN WITH MINERALS) TABS tablet Take 1 tablet by mouth daily.    . nitroGLYCERIN (NITROSTAT) 0.4 MG SL tablet Place 0.4 mg under the tongue every 5 (five) minutes as needed for chest pain.     . OXYGEN Inhale 3 L into the lungs daily.     . potassium chloride SA (K-DUR,KLOR-CON) 20 MEQ tablet Take 20 mEq by mouth at bedtime.    . promethazine (PHENERGAN) 25 MG tablet Take 25 mg by mouth daily as needed for nausea or vomiting.     . Psyllium (METAMUCIL PO) Take 1 Dose by mouth daily. Mix in liquid and drink     . ranolazine (RANEXA) 1000 MG SR tablet Take 1 tablet (1,000 mg total) by mouth 2 (two) times daily. 180 tablet 3  . sacubitril-valsartan (ENTRESTO) 24-26 MG Take 1 tablet by mouth 2 (two) times daily. 60 tablet 11  . Tamsulosin HCl (FLOMAX) 0.4 MG CAPS Take 0.4 mg by mouth at bedtime.     Marland Kitchen tiotropium (SPIRIVA) 18 MCG inhalation capsule Place 18 mcg into inhaler and inhale 2 (two) times daily.    Marland Kitchen torsemide (DEMADEX) 20 MG tablet Take 1 tablet (20 mg total) by mouth daily. 30 tablet 3  . vitamin B-12 (CYANOCOBALAMIN) 1000 MCG tablet Take 1,000 mcg by  mouth daily.     No current facility-administered medications for this visit.     Allergies:   Xarelto [rivaroxaban]; Adhesive [tape]; Atorvastatin; Codeine; Isosorbide nitrate; Latex; Levofloxacin; Lisinopril; Lorazepam; Mexiletine; Sertraline; and Tiotropium bromide monohydrate   Social History:  The patient  reports that he quit smoking about 35 years ago. His smoking use included cigarettes. He has a 100.00 pack-year smoking history. He has never used smokeless tobacco. He reports that he does not drink alcohol or use drugs.   Family History:  The patient's   family history includes Emphysema in his mother; Esophageal cancer in his brother; Heart disease in his mother; Heart failure in his father.   ROS:  Please see  the history of present illness.   All other systems are personally reviewed and negative.    Exam:    Vital Signs:  BP 126/71   Pulse 73   Ht 6\' 1"  (1.854 m)   Wt 221 lb (100.2 kg)   BMI 29.16 kg/m     Labs/Other Tests and Data Reviewed:    Recent Labs: 04/21/2018: B Natriuretic Peptide 49.3; BUN 21; Creatinine, Ser 1.64; Hemoglobin 13.8; Platelets 200; Potassium 4.0; Sodium 138   Wt Readings from Last 3 Encounters:  04/13/19 221 lb (100.2 kg)  05/24/18 228 lb 2.8 oz (103.5 kg)  05/19/18 227 lb 15.3 oz (103.4 kg)     Other studies personally reviewed: Additional studies/ records that were reviewed today include    Device Remote 3/20  Normal device function **    ASSESSMENT & PLAN:    Atrial fibrillation paroxysmal/persistent   High risk medication  Chronotropic incompetence   Implantable defibrillator-St [name]  The patient's device was interrogated and the information was fully reviewed.  The device was reprogrammed as below  Ischemic/nonischemic cardiomyopathy interval improvement  Congestive heart failure-chronic-systolic  Ventricular tachycardia--recurrent   Hypotension    Presyncope   Acutely SOB wi cough.  Cant finish sentences   Encouraged to go to [place]; w COVID reluctant, but expressed my concern that s situation is very concerning and we will not be able to sort it out without being seen  Expressed understanding and was [facility]  COVID 19 screen The patient denies symptoms of COVID 19 at this time.  The importance of social distancing was discussed today.  Follow-up:  80m    Current medicines are reviewed at length with the patient today.   The patient does not have concerns regarding his medicines.  The following changes were made today:  none  Labs/ tests ordered today include:   No orders of the defined types were placed in this encounter.   Future tests ( post COVID )     Patient Risk:  after full review of this patients clinical status, I feel that they are at moderate  risk at this time.  Today, I have spent 8  minutes with the patient with telehealth technology discussing the above.  Signed, Virl Axe, MD  04/13/2019 2:12 PM     [facility] [address] [place] [address] [phone] (office) [phone] (fax)

## 2019-04-13 NOTE — ED Provider Notes (Signed)
[facility] EMERGENCY DEPARTMENT Provider Note   CSN: [id number] Arrival date & time: 04/13/19  1453    History   Chief Complaint Chief Complaint  Patient presents with   Shortness of Breath    HPI Corey Tyler is a 78 y.o. male.     Pt presents to the ED today with sob.  Sx started yesterday.  He has a hx of CAD with an AICD ([name]), CHF, VT, COPD on 3L oxygen via [place], and afib on Eliquis. He feels like he has congestion in his left lung.  The pt said he gets this way every once in awhile.  He has not been exposed to anyone who he knows has Covid.  He has been staying at home in his apt.  The pt did a televisit with Dr. [name] who recommended that pt come to the ED for eval.  Pt denies fever.  No cp.  He has been compliant with his meds.     Past Medical History:  Diagnosis Date   A-fib [facility])    a. Noted on 12/2012 interrogation, placed on Apixaban and ultimately stopped NOACs 2/2 personal decision 8/14.   AICD (automatic cardioverter/defibrillator) present    Cardiac defibrillator -dual  [employer]   Arthritis    Atrial tachycardia-non sustained    a. Noted on 03/2012 interrogation.   Chronic systolic heart failure (HCC)    EF down to 20 to 25% per echo November 2012   COPD (chronic obstructive pulmonary disease) ([place])    Hyperlipidemia    Hypertension    ICD (implantable cardiac defibrillator) in place    NICM (nonischemic cardiomyopathy) ([place])    a. Normal cors 2000. b. Minimal plaque 2013.   Old myocardial infarction    a. ?Silent MI. b. Large fixed inferolateral defect c/w with prior infarct, no ischemia. c. Cath in 2000/2013 with only minimal CAD.   Presence of permanent cardiac pacemaker    Pulmonary nodule, right    Last scan in 2010 showing stability; felt to be benign.   PVC's (premature ventricular contractions)    Ventricular tachycardia, polymorphic ([place])    Rx via ICD 12/14    Patient Active Problem List   Diagnosis  Date Noted   Acute respiratory failure ([place]) 09/15/2017   COPD exacerbation ([place]) 09/15/2017   Sustained ventricular tachycardia ([place]) 06/10/2017   VT (ventricular tachycardia) ([place]) 11/02/2016   Benign fibroma of prostate 04/02/2015   Ventricular tachycardia, polymorphic ([place])    Diverticulosis of colon without hemorrhage 09/25/2013   Atrial fibrillation ([place]) 12/29/2012   Cardiac defibrillator -dual  [employer]    Hyperlipidemia    PVCs 20/94/7096   Chronic systolic heart failure ([place]) 10/07/2011   Cardiomyopathy, nonischemic and ischemic 09/24/2011   HYPERTENSION 12/18/2009   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 12/18/2009   PULMONARY NODULE 12/04/2008    Past Surgical History:  Procedure Laterality Date   [place]  03/30/2013   Procedure: CARDIOVERSION;  Surgeon: Thayer Headings, MD;  Location: [facility];  Service: Cardiovascular;;   COLON SURGERY     COLONOSCOPY N/A 09/25/2013   Procedure: COLONOSCOPY;  Surgeon: Jerene Bears, MD;  Location: [facility] ENDOSCOPY;  Service: Endoscopy;  Laterality: N/A;   ICD  12/2011   [name]   IMPLANTABLE CARDIOVERTER DEFIBRILLATOR IMPLANT N/A 01/06/2012   Procedure: IMPLANTABLE CARDIOVERTER DEFIBRILLATOR IMPLANT;  Surgeon:  Deboraha Sprang, MD;  Location: [facility] CATH LAB;  Service: Cardiovascular;  Laterality: N/A;   PACEMAKER IMPLANT     [employer] [name]   TEE WITHOUT CARDIOVERSION N/A 03/30/2013   Procedure: TRANSESOPHAGEAL ECHOCARDIOGRAM (TEE);  Surgeon: Thayer Headings, MD;  Location: [facility];  Service: Cardiovascular;  Laterality: N/A;   V TACH ABLATION  06/10/2017   V TACH ABLATION N/A 06/10/2017   Procedure: Stephanie Coup Ablation;  Surgeon: Evans Lance, MD;  Location: [facility] CV LAB;  Service: Cardiovascular;  Laterality: N/A;        Home Medications    Prior to Admission medications   Medication Sig  Start Date End Date Taking? Authorizing Provider  Albuterol Sulfate (PROAIR RESPICLICK) 903 (90 Base) MCG/ACT AEPB Inhale 2 puffs into the lungs every 6 (six) hours as needed (shortness of breath/ wheezing).    [provider]  amiodarone (PACERONE) 200 MG tablet Take 2 tablets (400 mg total) by mouth daily. Take 400mg  by mouth daily for 1 month, then resume 200mg  by mouth daily dose. Patient taking differently: Take 200 mg by mouth daily.  12/02/17   Deboraha Sprang, MD  Carboxymethylcellul-Glycerin (LUBRICATING EYE DROPS OP) Apply 1 drop to eye daily as needed (dry eyes).    [provider]  cholecalciferol (VITAMIN D) 1000 UNITS tablet Take 1,000 Units by mouth daily.    [provider]  doxycycline (VIBRAMYCIN) 100 MG capsule Take 1 capsule (100 mg total) by mouth 2 (two) times daily. 04/13/19   Isla Pence, MD  ELIQUIS 5 MG TABS tablet TAKE 1 TABLET BY MOUTH TWICE A DAY 07/30/15   Minus Breeding, MD  finasteride (PROSCAR) 5 MG tablet Take 5 mg by mouth daily.     [provider]  fluticasone (FLONASE) 50 MCG/ACT nasal spray Place 1 spray into both nostrils daily as needed for allergies.  09/08/17   [provider]  Fluticasone-Salmeterol (ADVAIR DISKUS) 250-50 MCG/DOSE AEPB Inhale 1 puff into the lungs 2 (two) times daily.    [provider]  levothyroxine (SYNTHROID, LEVOTHROID) 125 MCG tablet Take 125 mcg by mouth daily. 06/15/17   [provider]  Melatonin 3 MG TABS Take 3 mg by mouth at bedtime.    [provider]  metoprolol tartrate (LOPRESSOR) 25 MG tablet Take 1 tablet (25 mg total) by mouth 2 (two) times daily. 09/19/17   [name], MD  Multiple Vitamin (MULTIVITAMIN WITH MINERALS) TABS tablet Take 1 tablet by mouth daily.    [provider]  nitroGLYCERIN (NITROSTAT) 0.4 MG SL tablet Place 0.4 mg under the tongue every 5 (five) minutes as needed for chest pain.  11/12/11   Burtis Junes, NP  OXYGEN  Inhale 3 L into the lungs daily.     [provider]  potassium chloride SA (K-DUR,KLOR-CON) 20 MEQ tablet Take 20 mEq by mouth at bedtime.    [provider]  predniSONE (STERAPRED UNI-PAK 21 TAB) 10 MG (21) TBPK tablet Take 6 tabs for 2 days, then 5 for 2 days, then 4 for 2 days, then 3 for 2 days, 2 for 2 days, then 1 for 2 days 04/13/19   Isla Pence, MD  promethazine (PHENERGAN) 25 MG tablet Take 25 mg by mouth daily as needed for nausea or vomiting.  04/20/17   [provider]  Psyllium (METAMUCIL PO) Take 1 Dose by mouth daily. Mix in liquid and drink     [provider]  ranolazine (RANEXA) 1000 MG  SR tablet Take 1 tablet (1,000 mg total) by mouth 2 (two) times daily. 09/23/18   Deboraha Sprang, MD  sacubitril-valsartan (ENTRESTO) 24-26 MG Take 1 tablet by mouth 2 (two) times daily. 03/26/17   Deboraha Sprang, MD  Tamsulosin HCl (FLOMAX) 0.4 MG CAPS Take 0.4 mg by mouth at bedtime.     [provider]  tiotropium (SPIRIVA) 18 MCG inhalation capsule Place 18 mcg into inhaler and inhale 2 (two) times daily.    [provider]  torsemide (DEMADEX) 20 MG tablet Take 1 tablet (20 mg total) by mouth daily. 12/29/17   Patsey Berthold, NP  vitamin B-12 (CYANOCOBALAMIN) 1000 MCG tablet Take 1,000 mcg by mouth daily.    [provider]    Family History Family History  Problem Relation Age of Onset   Emphysema Mother    Heart disease Mother        AVR   Heart failure Father        Died agwe 62   Esophageal cancer Brother    Colon cancer Neg Hx     Social History Social History   Tobacco Use   Smoking status: Former Smoker    Packs/day: 2.50    Years: 40.00    Pack years: 100.00    Types: Cigarettes    Last attempt to quit: 11/24/1983    Years since quitting: 35.4   Smokeless tobacco: Never Used   Tobacco comment: 2 1/2 ppd x 40 years  Substance Use Topics   Alcohol use: No   Drug use: No     Allergies    Xarelto [rivaroxaban]; Adhesive [tape]; Atorvastatin; Codeine; Isosorbide nitrate; Latex; Levofloxacin; Lisinopril; Lorazepam; Mexiletine; Sertraline; and Tiotropium bromide monohydrate   Review of Systems Review of Systems  Respiratory: Positive for cough and shortness of breath.   All other systems reviewed and are negative.    Physical Exam Updated Vital Signs BP 133/75 (BP Location: Right Arm)    Pulse (!) 59    Temp 98.6 F (37 C) (Oral)    Resp 16    Ht 6\' 1"  (1.854 m)    Wt 100.2 kg    SpO2 96%    BMI 29.16 kg/m   Physical Exam Vitals signs and nursing note reviewed.  Constitutional:      Appearance: He is well-developed.  HENT:     Head: Normocephalic and atraumatic.     Mouth/Throat:     Mouth: Mucous membranes are moist.     Pharynx: Oropharynx is clear.  Eyes:     Extraocular Movements: Extraocular movements intact.     Pupils: Pupils are equal, round, and reactive to light.  Neck:     Musculoskeletal: Normal range of motion and neck supple.  Cardiovascular:     Rate and Rhythm: Normal rate and regular rhythm.  Pulmonary:     Effort: Tachypnea present.     Comments: Sob with speaking Abdominal:     General: Bowel sounds are normal.     Palpations: Abdomen is soft.  Musculoskeletal: Normal range of motion.  Skin:    General: Skin is warm and dry.     Capillary Refill: Capillary refill takes less than 2 seconds.  Neurological:     General: No focal deficit present.     Mental Status: He is alert and oriented to person, place, and time.  Psychiatric:        Mood and Affect: Mood normal.        Behavior:  Behavior normal.      ED Treatments / Results  Labs (all labs ordered are listed, but only abnormal results are displayed) Labs Reviewed  BASIC METABOLIC PANEL - Abnormal; Notable for the following components:      Result Value   Glucose, Bld 119 (*)    Creatinine, Ser 1.67 (*)    Calcium 8.7 (*)    GFR calc non Af Amer 38 (*)    GFR calc Af Amer  44 (*)    All other components within normal limits  CBC WITH DIFFERENTIAL/PLATELET - Abnormal; Notable for the following components:   RBC 4.07 (*)    nRBC 0.3 (*)    All other components within normal limits  BRAIN NATRIURETIC PEPTIDE - Abnormal; Notable for the following components:   B Natriuretic Peptide 111.1 (*)    All other components within normal limits  SARS CORONAVIRUS 2 (HOSPITAL ORDER, [facility])  TROPONIN I    EKG EKG Interpretation  Date/Time:  Thursday Apr 13 2019 15:08:09 EDT Ventricular Rate:  69 PR Interval:    QRS Duration: 107 QT Interval:  442 QTC Calculation: 474 R Axis:   21 Text Interpretation:  Sinus rhythm Incomplete left bundle branch block Low voltage, extremity and precordial leads ST elevation, consider inferior injury No significant change since last tracing Confirmed by Isla Pence [phone]) on 04/13/2019 3:21:12 PM   Radiology Dg Chest Port 1 View  Result Date: 04/13/2019 CLINICAL DATA:  Shortness of breath EXAM: PORTABLE CHEST 1 VIEW COMPARISON:  12/21/2018 FINDINGS: The patient is status post placement of a dual chamber left-sided pacemaker. This is stable in positioning. The cardiac silhouette is enlarged. Prominent interstitial lung markings are seen bilaterally. There is a left basilar airspace opacity which is slightly worsened from prior study. No acute osseous abnormality detected. IMPRESSION: 1. Cardiomegaly with mild volume overload. 2. Persistent opacity at the left lung base which may represent chronic scarring and or atelectasis. A trace left-sided pleural effusion is not excluded. Electronically Signed   By: Constance Holster M.D.   On: 04/13/2019 17:04    Procedures Procedures (including critical care time)  Medications Ordered in ED Medications  predniSONE (DELTASONE) tablet 60 mg (60 mg Oral Given 04/13/19 1743)  doxycycline (VIBRA-TABS) tablet 100 mg (100 mg Oral Given 04/13/19 1743)     Initial  Impression / Assessment and Plan / ED Course  I have reviewed the triage vital signs and the nursing notes.  Pertinent labs & imaging results that were available during my care of the patient were reviewed by me and considered in my medical decision making (see chart for details).       Pacemaker interrogated.  No arrhythmias.  Fluid level is ok.  Battery life 1.5 to 2 years.  Pt feels much better. He said he felt ok with ambulation.  His O2 sats dropped a little by the time he got back to his room, but he said he's supposed to be on 6L when he walks.  He was only on 3.  He was offered admission, but he wants to go home.  He knows to come back if needed.  F/u with pcp.  Corey Tyler was evaluated in Emergency Department on 04/13/2019 for the symptoms described in the history of present illness. He was evaluated in the context of the global COVID-19 pandemic, which necessitated consideration that the patient might be at risk for infection with the SARS-CoV-2 virus that causes COVID-19. Institutional protocols  and algorithms that pertain to the evaluation of patients at risk for COVID-19 are in a state of rapid change based on information released by regulatory bodies including the CDC and federal and state organizations. These policies and algorithms were followed during the patient's care in the ED.  Final Clinical Impressions(s) / ED Diagnoses   Final diagnoses:  COPD exacerbation ([place])  Covid-19 Virus not Detected    ED Discharge Orders         Ordered    predniSONE (STERAPRED UNI-PAK 21 TAB) 10 MG (21) TBPK tablet     04/13/19 1739    doxycycline (VIBRAMYCIN) 100 MG capsule  2 times daily     04/13/19 1739           Isla Pence, MD 04/13/19 1746

## 2019-04-13 NOTE — ED Notes (Signed)
Pt ambulated in halls on 3L. O2 sats stayed above 90% until back in the room. Pt stated he normally wears 6L when ambulating. Pt states he feels comfortable going home.

## 2019-04-13 NOTE — ED Notes (Signed)
Wife updated on phone and stated she will pick him up.

## 2019-04-13 NOTE — ED Notes (Signed)
Pacemaker interrogated. 

## 2019-04-13 NOTE — ED Triage Notes (Signed)
Pt reports SOB x3 weeks but has worsened since yesterday. Pt states he feels congestion in his left lung. Reports a cough.

## 2019-05-05 DIAGNOSIS — J449 Chronic obstructive pulmonary disease, unspecified: Secondary | ICD-10-CM | POA: Diagnosis not present

## 2019-05-05 DIAGNOSIS — E785 Hyperlipidemia, unspecified: Secondary | ICD-10-CM | POA: Diagnosis not present

## 2019-05-05 DIAGNOSIS — R200 Anesthesia of skin: Secondary | ICD-10-CM | POA: Diagnosis not present

## 2019-05-23 ENCOUNTER — Ambulatory Visit (INDEPENDENT_AMBULATORY_CARE_PROVIDER_SITE_OTHER): Payer: PPO | Admitting: *Deleted

## 2019-05-23 DIAGNOSIS — I255 Ischemic cardiomyopathy: Secondary | ICD-10-CM | POA: Diagnosis not present

## 2019-05-23 LAB — CUP PACEART REMOTE DEVICE CHECK
Battery Remaining Longevity: 20 mo
Battery Remaining Percentage: 21 %
Battery Voltage: 2.78 V
Brady Statistic AP VP Percent: 1.1 %
Brady Statistic AP VS Percent: 66 %
Brady Statistic AS VP Percent: 1 %
Brady Statistic AS VS Percent: 32 %
Brady Statistic RA Percent Paced: 65 %
Brady Statistic RV Percent Paced: 1.2 %
Date Time Interrogation Session: 20200630060017
HighPow Impedance: 80 Ohm
HighPow Impedance: 80 Ohm
Implantable Lead Implant Date: 20130213
Implantable Lead Implant Date: 20130213
Implantable Lead Location: 753859
Implantable Lead Location: 753860
Implantable Pulse Generator Implant Date: 20130213
Lead Channel Impedance Value: 400 Ohm
Lead Channel Impedance Value: 430 Ohm
Lead Channel Pacing Threshold Amplitude: 0.5 V
Lead Channel Pacing Threshold Amplitude: 0.75 V
Lead Channel Pacing Threshold Pulse Width: 0.5 ms
Lead Channel Pacing Threshold Pulse Width: 0.6 ms
Lead Channel Sensing Intrinsic Amplitude: 1.8 mV
Lead Channel Sensing Intrinsic Amplitude: 12 mV
Lead Channel Setting Pacing Amplitude: 1.5 V
Lead Channel Setting Pacing Amplitude: 2.5 V
Lead Channel Setting Pacing Pulse Width: 0.6 ms
Lead Channel Setting Sensing Sensitivity: 0.5 mV
Pulse Gen Serial Number: 7003419

## 2019-06-04 ENCOUNTER — Encounter: Payer: Self-pay | Admitting: Cardiology

## 2019-06-04 NOTE — Progress Notes (Signed)
Remote ICD transmission.   

## 2019-06-05 DIAGNOSIS — H04123 Dry eye syndrome of bilateral lacrimal glands: Secondary | ICD-10-CM | POA: Diagnosis not present

## 2019-06-05 DIAGNOSIS — H40013 Open angle with borderline findings, low risk, bilateral: Secondary | ICD-10-CM | POA: Diagnosis not present

## 2019-06-09 DIAGNOSIS — R0600 Dyspnea, unspecified: Secondary | ICD-10-CM | POA: Diagnosis not present

## 2019-06-09 DIAGNOSIS — J449 Chronic obstructive pulmonary disease, unspecified: Secondary | ICD-10-CM | POA: Diagnosis not present

## 2019-06-09 DIAGNOSIS — I2510 Atherosclerotic heart disease of native coronary artery without angina pectoris: Secondary | ICD-10-CM | POA: Diagnosis not present

## 2019-06-09 DIAGNOSIS — I5022 Chronic systolic (congestive) heart failure: Secondary | ICD-10-CM | POA: Diagnosis not present

## 2019-06-09 DIAGNOSIS — I4891 Unspecified atrial fibrillation: Secondary | ICD-10-CM | POA: Diagnosis not present

## 2019-06-20 ENCOUNTER — Other Ambulatory Visit: Payer: Self-pay | Admitting: Nurse Practitioner

## 2019-07-29 ENCOUNTER — Other Ambulatory Visit: Payer: Self-pay

## 2019-07-29 ENCOUNTER — Observation Stay (HOSPITAL_COMMUNITY)
Admission: EM | Admit: 2019-07-29 | Discharge: 2019-07-30 | Disposition: A | Discharge status: Home / Self Care | Payer: No Typology Code available for payment source | Attending: Internal Medicine | Admitting: Internal Medicine

## 2019-07-29 ENCOUNTER — Emergency Department (HOSPITAL_COMMUNITY): Payer: No Typology Code available for payment source

## 2019-07-29 ENCOUNTER — Observation Stay (HOSPITAL_COMMUNITY): Payer: No Typology Code available for payment source

## 2019-07-29 DIAGNOSIS — I252 Old myocardial infarction: Secondary | ICD-10-CM | POA: Diagnosis not present

## 2019-07-29 DIAGNOSIS — I4430 Unspecified atrioventricular block: Secondary | ICD-10-CM | POA: Diagnosis not present

## 2019-07-29 DIAGNOSIS — R0902 Hypoxemia: Secondary | ICD-10-CM | POA: Diagnosis not present

## 2019-07-29 DIAGNOSIS — R531 Weakness: Secondary | ICD-10-CM | POA: Diagnosis not present

## 2019-07-29 DIAGNOSIS — Z7901 Long term (current) use of anticoagulants: Secondary | ICD-10-CM | POA: Insufficient documentation

## 2019-07-29 DIAGNOSIS — I428 Other cardiomyopathies: Secondary | ICD-10-CM | POA: Insufficient documentation

## 2019-07-29 DIAGNOSIS — N179 Acute kidney failure, unspecified: Secondary | ICD-10-CM | POA: Diagnosis present

## 2019-07-29 DIAGNOSIS — Z888 Allergy status to other drugs, medicaments and biological substances status: Secondary | ICD-10-CM | POA: Insufficient documentation

## 2019-07-29 DIAGNOSIS — Z9981 Dependence on supplemental oxygen: Secondary | ICD-10-CM | POA: Insufficient documentation

## 2019-07-29 DIAGNOSIS — E039 Hypothyroidism, unspecified: Secondary | ICD-10-CM | POA: Insufficient documentation

## 2019-07-29 DIAGNOSIS — I472 Ventricular tachycardia: Secondary | ICD-10-CM | POA: Insufficient documentation

## 2019-07-29 DIAGNOSIS — E875 Hyperkalemia: Secondary | ICD-10-CM | POA: Diagnosis not present

## 2019-07-29 DIAGNOSIS — N189 Chronic kidney disease, unspecified: Secondary | ICD-10-CM | POA: Insufficient documentation

## 2019-07-29 DIAGNOSIS — Z7982 Long term (current) use of aspirin: Secondary | ICD-10-CM | POA: Insufficient documentation

## 2019-07-29 DIAGNOSIS — Z20828 Contact with and (suspected) exposure to other viral communicable diseases: Secondary | ICD-10-CM | POA: Diagnosis not present

## 2019-07-29 DIAGNOSIS — Z79890 Hormone replacement therapy (postmenopausal): Secondary | ICD-10-CM | POA: Diagnosis not present

## 2019-07-29 DIAGNOSIS — R945 Abnormal results of liver function studies: Secondary | ICD-10-CM | POA: Diagnosis present

## 2019-07-29 DIAGNOSIS — I5022 Chronic systolic (congestive) heart failure: Secondary | ICD-10-CM | POA: Diagnosis present

## 2019-07-29 DIAGNOSIS — I130 Hypertensive heart and chronic kidney disease with heart failure and stage 1 through stage 4 chronic kidney disease, or unspecified chronic kidney disease: Secondary | ICD-10-CM | POA: Insufficient documentation

## 2019-07-29 DIAGNOSIS — Z79899 Other long term (current) drug therapy: Secondary | ICD-10-CM | POA: Diagnosis not present

## 2019-07-29 DIAGNOSIS — M1990 Unspecified osteoarthritis, unspecified site: Secondary | ICD-10-CM | POA: Insufficient documentation

## 2019-07-29 DIAGNOSIS — Z7951 Long term (current) use of inhaled steroids: Secondary | ICD-10-CM | POA: Insufficient documentation

## 2019-07-29 DIAGNOSIS — J9611 Chronic respiratory failure with hypoxia: Secondary | ICD-10-CM | POA: Insufficient documentation

## 2019-07-29 DIAGNOSIS — Z8249 Family history of ischemic heart disease and other diseases of the circulatory system: Secondary | ICD-10-CM | POA: Insufficient documentation

## 2019-07-29 DIAGNOSIS — I447 Left bundle-branch block, unspecified: Secondary | ICD-10-CM | POA: Diagnosis not present

## 2019-07-29 DIAGNOSIS — Z87891 Personal history of nicotine dependence: Secondary | ICD-10-CM | POA: Insufficient documentation

## 2019-07-29 DIAGNOSIS — R000 Tachycardia, unspecified: Secondary | ICD-10-CM | POA: Diagnosis not present

## 2019-07-29 DIAGNOSIS — J449 Chronic obstructive pulmonary disease, unspecified: Secondary | ICD-10-CM | POA: Insufficient documentation

## 2019-07-29 DIAGNOSIS — I2510 Atherosclerotic heart disease of native coronary artery without angina pectoris: Secondary | ICD-10-CM | POA: Diagnosis not present

## 2019-07-29 DIAGNOSIS — Z885 Allergy status to narcotic agent status: Secondary | ICD-10-CM | POA: Insufficient documentation

## 2019-07-29 DIAGNOSIS — Z881 Allergy status to other antibiotic agents status: Secondary | ICD-10-CM | POA: Insufficient documentation

## 2019-07-29 DIAGNOSIS — I480 Paroxysmal atrial fibrillation: Secondary | ICD-10-CM | POA: Insufficient documentation

## 2019-07-29 DIAGNOSIS — E785 Hyperlipidemia, unspecified: Secondary | ICD-10-CM | POA: Diagnosis not present

## 2019-07-29 DIAGNOSIS — K760 Fatty (change of) liver, not elsewhere classified: Secondary | ICD-10-CM | POA: Insufficient documentation

## 2019-07-29 DIAGNOSIS — R7989 Other specified abnormal findings of blood chemistry: Secondary | ICD-10-CM

## 2019-07-29 DIAGNOSIS — I440 Atrioventricular block, first degree: Secondary | ICD-10-CM | POA: Diagnosis not present

## 2019-07-29 DIAGNOSIS — R251 Tremor, unspecified: Principal | ICD-10-CM | POA: Insufficient documentation

## 2019-07-29 DIAGNOSIS — J9811 Atelectasis: Secondary | ICD-10-CM | POA: Diagnosis not present

## 2019-07-29 LAB — TROPONIN I (HIGH SENSITIVITY)
Troponin I (High Sensitivity): 11 ng/L (ref <18)
Troponin I (High Sensitivity): 12 ng/L (ref <18)

## 2019-07-29 LAB — URINALYSIS, ROUTINE W REFLEX MICROSCOPIC
Bilirubin Urine: NEGATIVE
Glucose, UA: NEGATIVE mg/dL
Hgb urine dipstick: NEGATIVE
Ketones, ur: NEGATIVE mg/dL
Leukocytes,Ua: NEGATIVE
Nitrite: NEGATIVE
Protein, ur: NEGATIVE mg/dL
Specific Gravity, Urine: 1.011 (ref 1.005–1.030)
pH: 5 (ref 5.0–8.0)

## 2019-07-29 LAB — COMPREHENSIVE METABOLIC PANEL
ALT: 15 U/L (ref 0–44)
AST: 63 U/L — ABNORMAL HIGH (ref 15–41)
Albumin: 3.1 g/dL — ABNORMAL LOW (ref 3.5–5.0)
Alkaline Phosphatase: 47 U/L (ref 38–126)
Anion gap: 9 (ref 5–15)
BUN: 20 mg/dL (ref 8–23)
CO2: 26 mmol/L (ref 22–32)
Calcium: 8.4 mg/dL — ABNORMAL LOW (ref 8.9–10.3)
Chloride: 95 mmol/L — ABNORMAL LOW (ref 98–111)
Creatinine, Ser: 1.82 mg/dL — ABNORMAL HIGH (ref 0.61–1.24)
GFR calc Af Amer: 40 mL/min — ABNORMAL LOW (ref >60)
GFR calc non Af Amer: 35 mL/min — ABNORMAL LOW (ref >60)
Glucose, Bld: 121 mg/dL — ABNORMAL HIGH (ref 70–99)
Potassium: 5.3 mmol/L — ABNORMAL HIGH (ref 3.5–5.1)
Sodium: 130 mmol/L — ABNORMAL LOW (ref 135–145)
Total Bilirubin: 1.7 mg/dL — ABNORMAL HIGH (ref 0.3–1.2)
Total Protein: 5.3 g/dL — ABNORMAL LOW (ref 6.5–8.1)

## 2019-07-29 LAB — CBC WITH DIFFERENTIAL/PLATELET
Abs Immature Granulocytes: 0.03 10*3/uL (ref 0.00–0.07)
Basophils Absolute: 0 10*3/uL (ref 0.0–0.1)
Basophils Relative: 0 %
Eosinophils Absolute: 0 10*3/uL (ref 0.0–0.5)
Eosinophils Relative: 0 %
HCT: 36.1 % — ABNORMAL LOW (ref 39.0–52.0)
Hemoglobin: 12.4 g/dL — ABNORMAL LOW (ref 13.0–17.0)
Immature Granulocytes: 0 %
Lymphocytes Relative: 20 %
Lymphs Abs: 1.3 10*3/uL (ref 0.7–4.0)
MCH: 33.4 pg (ref 26.0–34.0)
MCHC: 34.3 g/dL (ref 30.0–36.0)
MCV: 97.3 fL (ref 80.0–100.0)
Monocytes Absolute: 0.4 10*3/uL (ref 0.1–1.0)
Monocytes Relative: 6 %
Neutro Abs: 4.9 10*3/uL (ref 1.7–7.7)
Neutrophils Relative %: 74 %
Platelets: 198 10*3/uL (ref 150–400)
RBC: 3.71 MIL/uL — ABNORMAL LOW (ref 4.22–5.81)
RDW: 13.8 % (ref 11.5–15.5)
WBC: 6.8 10*3/uL (ref 4.0–10.5)
nRBC: 0 % (ref 0.0–0.2)

## 2019-07-29 LAB — BRAIN NATRIURETIC PEPTIDE: B Natriuretic Peptide: 66.6 pg/mL (ref 0.0–100.0)

## 2019-07-29 LAB — TSH: TSH: 0.63 u[IU]/mL (ref 0.350–4.500)

## 2019-07-29 LAB — AMMONIA: Ammonia: 44 umol/L — ABNORMAL HIGH (ref 9–35)

## 2019-07-29 LAB — LIPASE, BLOOD: Lipase: 17 U/L (ref 11–51)

## 2019-07-29 LAB — VITAMIN B12: Vitamin B-12: 368 pg/mL (ref 180–914)

## 2019-07-29 LAB — PHOSPHORUS: Phosphorus: 3.3 mg/dL (ref 2.5–4.6)

## 2019-07-29 LAB — SARS CORONAVIRUS 2 BY RT PCR (HOSPITAL ORDER, PERFORMED IN ~~LOC~~ HOSPITAL LAB): SARS Coronavirus 2: NEGATIVE

## 2019-07-29 LAB — MAGNESIUM: Magnesium: 2.1 mg/dL (ref 1.7–2.4)

## 2019-07-29 MED ORDER — ACETAMINOPHEN 325 MG PO TABS: 650.0000 mg | ORAL_TABLET | Freq: Four times a day (QID) | ORAL | Status: DC | PRN

## 2019-07-29 MED ORDER — LEVOTHYROXINE SODIUM 25 MCG PO TABS
125.0000 ug | ORAL_TABLET | Freq: Every day | ORAL | Status: DC
  Administered 2019-07-30: 125 ug via ORAL
  Filled 2019-07-29: qty 1

## 2019-07-29 MED ORDER — RANOLAZINE ER 500 MG PO TB12
1000.0000 mg | ORAL_TABLET | Freq: Two times a day (BID) | ORAL | Status: DC
  Administered 2019-07-29 – 2019-07-30 (×2): 1000 mg via ORAL
  Filled 2019-07-29 (×2): qty 2

## 2019-07-29 MED ORDER — UMECLIDINIUM BROMIDE 62.5 MCG/INH IN AEPB
1.0000 | INHALATION_SPRAY | Freq: Every day | RESPIRATORY_TRACT | Status: DC
  Filled 2019-07-29: qty 7

## 2019-07-29 MED ORDER — ACETAMINOPHEN 650 MG RE SUPP: 650.0000 mg | Freq: Four times a day (QID) | RECTAL | Status: DC | PRN

## 2019-07-29 MED ORDER — TAMSULOSIN HCL 0.4 MG PO CAPS
0.4000 mg | ORAL_CAPSULE | Freq: Every day | ORAL | Status: DC
  Administered 2019-07-29: 21:00:00 0.4 mg via ORAL
  Filled 2019-07-29: qty 1

## 2019-07-29 MED ORDER — MAGNESIUM 200 MG PO TABS: ORAL_TABLET | Freq: Every day | ORAL | Status: DC

## 2019-07-29 MED ORDER — ASPIRIN EC 81 MG PO TBEC
81.0000 mg | DELAYED_RELEASE_TABLET | Freq: Every day | ORAL | Status: DC
  Filled 2019-07-29: qty 1

## 2019-07-29 MED ORDER — ASPIRIN EC 325 MG PO TBEC: 325.0000 mg | DELAYED_RELEASE_TABLET | Freq: Once | ORAL | Status: DC

## 2019-07-29 MED ORDER — APIXABAN 5 MG PO TABS
5.0000 mg | ORAL_TABLET | Freq: Two times a day (BID) | ORAL | Status: DC
  Administered 2019-07-29 – 2019-07-30 (×2): 5 mg via ORAL
  Filled 2019-07-29 (×2): qty 1

## 2019-07-29 MED ORDER — MOMETASONE FURO-FORMOTEROL FUM 200-5 MCG/ACT IN AERO
2.0000 | INHALATION_SPRAY | Freq: Two times a day (BID) | RESPIRATORY_TRACT | Status: DC
  Filled 2019-07-29: qty 8.8

## 2019-07-29 MED ORDER — ALBUTEROL SULFATE (2.5 MG/3ML) 0.083% IN NEBU: 2.5000 mg | INHALATION_SOLUTION | Freq: Four times a day (QID) | RESPIRATORY_TRACT | Status: DC | PRN

## 2019-07-29 MED ORDER — AMIODARONE HCL 200 MG PO TABS
200.0000 mg | ORAL_TABLET | Freq: Every day | ORAL | Status: DC
  Administered 2019-07-30: 200 mg via ORAL
  Filled 2019-07-29: qty 1

## 2019-07-29 MED ORDER — TORSEMIDE 20 MG PO TABS
20.0000 mg | ORAL_TABLET | Freq: Every day | ORAL | Status: DC
  Administered 2019-07-30: 20 mg via ORAL
  Filled 2019-07-29: qty 1

## 2019-07-29 MED ORDER — FINASTERIDE 5 MG PO TABS
5.0000 mg | ORAL_TABLET | Freq: Every day | ORAL | Status: DC
  Administered 2019-07-30: 5 mg via ORAL
  Filled 2019-07-29: qty 1

## 2019-07-29 MED ORDER — ONDANSETRON HCL 4 MG PO TABS: 4.0000 mg | ORAL_TABLET | Freq: Four times a day (QID) | ORAL | Status: DC | PRN

## 2019-07-29 MED ORDER — MENTHOL 3 MG MT LOZG
1.0000 | LOZENGE | OROMUCOSAL | Status: DC | PRN
  Administered 2019-07-29: 3 mg via ORAL
  Filled 2019-07-29: qty 9

## 2019-07-29 MED ORDER — ONDANSETRON HCL 4 MG/2ML IJ SOLN
INTRAMUSCULAR | Status: AC
  Administered 2019-07-29: 4 mg
  Filled 2019-07-29: qty 2

## 2019-07-29 MED ORDER — ONDANSETRON HCL 4 MG/2ML IJ SOLN: 4.0000 mg | Freq: Four times a day (QID) | INTRAMUSCULAR | Status: DC | PRN

## 2019-07-29 MED ORDER — PANTOPRAZOLE SODIUM 40 MG PO TBEC: 40.0000 mg | DELAYED_RELEASE_TABLET | Freq: Every day | ORAL | Status: DC | PRN

## 2019-07-29 MED ORDER — METOPROLOL TARTRATE 25 MG PO TABS
25.0000 mg | ORAL_TABLET | Freq: Two times a day (BID) | ORAL | Status: DC
  Administered 2019-07-29 – 2019-07-30 (×2): 25 mg via ORAL
  Filled 2019-07-29 (×2): qty 1

## 2019-07-29 MED ORDER — TIOTROPIUM BROMIDE MONOHYDRATE 2.5 MCG/ACT IN AERS: 2.0000 | INHALATION_SPRAY | Freq: Every morning | RESPIRATORY_TRACT | Status: DC

## 2019-07-29 NOTE — Progress Notes (Signed)
Pt was asking for Suezanne Jacquet NP on call and was told to see note from MD [place]. RN informed pt that MD wants to hold Entresto due to potassium being elevated and his creatinine being elevated. Pt stated that he has been on this medication for a long time and he can feel his heart messing up when he does not have it. Pt wanted RN to inform MD that he wants the medication anyway. RN will message NP on call to see if she can come and explain the reason of the hold to the patient.    Eleanora Neighbor, RN

## 2019-07-29 NOTE — H&P (Signed)
History and Physical    ZACHERI DALAL [id number] DOB: 1941/07/25 DOA: 07/29/2019  PCP: [name], MD  Patient coming from: Home I have personally briefly reviewed patient's old medical records in [place]  Chief Complaint: Generalized weakness and tremors started this morning.  HPI: Corey Tyler is a very pleasant 78 y.o. Caucasian male with medical history significant of systolic congestive heart failure with ejection fraction of 25%, status post pacemaker/defibrillator, chronic hypoxic respiratory failure on 3 to 6 L of oxygen at home, COPD, hyperlipidemia, coronary artery disease, arthritis, paroxysmal A. fib brought by his wife to emergency department due to generalized weakness and tremors which started this morning at 7:00.  Patient reports that he was unable to get out of the bed due to weakness, he ate snack bar and drink Gatorade in breakfast which he vomited after some time.  Patient's wife was concerned about stroke so she called 911 and brought patient to the emergency department for further evaluation and management.  Has chronic tremors but never diagnosed with Parkinson or dementia.  He never had such episode before.  Patient reports chronic shortness of breath, orthopnea, leg swelling but denies any exacerbation of the symptoms.  He is compliant with his medications and denies smoking, alcohol, illicit drug use.    Denies headache, blurry vision, dizziness, lightheadedness, syncope, fall, blackout, palpitation, chest pain, change in bowel movements, hematuria, melena, decreased appetite, weight loss.   He lives with his wife at home and uses walker sometimes.  He uses oxygen 3 L while at rest and 4 to 6 L during walking.  ED Course: Sitting comfortably on the bed.  Wife at bedside.  Maintaining oxygen saturation of 94% on 4 L of oxygen via nasal cannula.  Vitals stable.  Reports improvement in symptoms.  Chest x-ray and CT head came back negative for acute findings.   CMP shows worsening kidney function and hyperkalemia.  Ammonia level is 44.  Patient alert and oriented communicating well during the encounter.  Review of Systems: As per HPI otherwise negative.    Past Medical History:  Diagnosis Date  . A-fib ([place])    a. Noted on 12/2012 interrogation, placed on Apixaban and ultimately stopped NOACs 2/2 personal decision 8/14.  Marland Kitchen AICD (automatic cardioverter/defibrillator) present    Cardiac defibrillator -dual  [employer]  . Arthritis   . Atrial tachycardia-non sustained    a. Noted on 03/2012 interrogation.  . Chronic systolic heart failure (HCC)    EF down to 20 to 25% per echo November 2012  . COPD (chronic obstructive pulmonary disease) ([place])   . Hyperlipidemia   . Hypertension   . ICD (implantable cardiac defibrillator) in place   . NICM (nonischemic cardiomyopathy) ([place])    a. Normal cors 2000. b. Minimal plaque 2013.  . Old myocardial infarction    a. ?Silent MI. b. Large fixed inferolateral defect c/w with prior infarct, no ischemia. c. Cath in 2000/2013 with only minimal CAD.  Marland Kitchen Presence of permanent cardiac pacemaker   . Pulmonary nodule, right    Last scan in 2010 showing stability; felt to be benign.  Marland Kitchen PVC's (premature ventricular contractions)   . Ventricular tachycardia, polymorphic ([place])    Rx via ICD 12/14    Past Surgical History:  Procedure Laterality Date  . APPENDECTOMY    . CARDIAC CATHETERIZATION  2000  . CARDIAC DEFIBRILLATOR PLACEMENT    . CARDIOVERSION  03/30/2013   Procedure: CARDIOVERSION;  Surgeon: Thayer Headings, MD;  Location: [facility] ENDOSCOPY;  Service: Cardiovascular;;  . COLON SURGERY    . COLONOSCOPY N/A 09/25/2013   Procedure: COLONOSCOPY;  Surgeon: Jerene Bears, MD;  Location: [facility] ENDOSCOPY;  Service: Endoscopy;  Laterality: N/A;  . ICD  12/2011   [name]  . IMPLANTABLE CARDIOVERTER DEFIBRILLATOR IMPLANT N/A 01/06/2012   Procedure: IMPLANTABLE CARDIOVERTER DEFIBRILLATOR IMPLANT;  Surgeon: Deboraha Sprang, MD;   Location: [facility] C[facility] LAB;  Service: Cardiovascular;  Laterality: N/A;  . PACEMAKER IMPLANT     [name]  . TEE WITHOUT CARDIOVERSION N/A 03/30/2013   Procedure: TRANSESOPHAGEAL ECHOCARDIOGRAM (TEE);  Surgeon: Thayer Headings, MD;  Location: [place];  Service: Cardiovascular;  Laterality: N/A;  . V TACH ABLATION  06/10/2017  . V TACH ABLATION N/A 06/10/2017   Procedure: V Tach Ablation;  Surgeon: Evans Lance, MD;  Location: [place] CV LAB;  Service: Cardiovascular;  Laterality: N/A;     reports that he quit smoking about 35 years ago. His smoking use included cigarettes. He has a 100.00 pack-year smoking history. He has never used smokeless tobacco. He reports that he does not drink alcohol or use drugs.  Allergies  Allergen Reactions  . Xarelto [Rivaroxaban] Other (See Comments)    Bleeding  . Adhesive [Tape] Hives, Itching and Rash  . Atorvastatin Cough  . Codeine Nausea Only  . Isosorbide Nitrate Other (See Comments)    Made him feel bad  . Latex Itching and Other (See Comments)    Rash, itching, burning  . Levofloxacin Other (See Comments)    Tachycardia  . Lisinopril Cough  . Lorazepam Other (See Comments)    "felt bad"  . Mexiletine Other (See Comments)    GI distress  . Sertraline Other (See Comments)    Made him feel bad    Family History  Problem Relation Age of Onset  . Emphysema Mother   . Heart disease Mother        AVR  . Heart failure Father        Died agwe 53  . Esophageal cancer Brother   . Colon cancer Neg Hx     Prior to Admission medications   Medication Sig Start Date End Date Taking? Authorizing Provider  acetaminophen (TYLENOL) 500 MG tablet Take 500 mg by mouth every 6 (six) hours as needed for headache (pain).   Yes [provider]  albuterol (VENTOLIN HFA) 108 (90 Base) MCG/ACT inhaler Inhale 2 puffs into the lungs every 6 (six) hours as needed for wheezing or shortness of breath.   Yes [provider]  amiodarone  (PACERONE) 200 MG tablet Take 2 tablets (400 mg total) by mouth daily. Take 400mg  by mouth daily for 1 month, then resume 200mg  by mouth daily dose. Patient taking differently: Take 200 mg by mouth daily.  12/02/17  Yes Deboraha Sprang, MD  aspirin EC 325 MG tablet Take 325 mg by mouth once.   Yes [provider]  Carboxymethylcellul-Glycerin (LUBRICATING EYE DROPS OP) Place 1 drop into both eyes daily as needed (dry eyes).    Yes [provider]  cholecalciferol (VITAMIN D) 1000 UNITS tablet Take 1,000 Units by mouth daily.   Yes [provider]  doxycycline (VIBRAMYCIN) 100 MG capsule Take 1 capsule (100 mg total) by mouth 2 (two) times daily. 04/13/19  Yes Isla Pence, MD  ELIQUIS 5 MG TABS tablet TAKE 1 TABLET BY MOUTH TWICE A DAY Patient taking differently: Take 5 mg by mouth 2 (two) times daily.  07/30/15  Yes Minus Breeding, MD  finasteride (PROSCAR) 5 MG tablet Take 5 mg by mouth daily.    Yes [provider]  fluticasone (FLONASE) 50 MCG/ACT nasal spray Place 1 spray into both nostrils daily as needed for allergies.  09/08/17  Yes [provider]  Fluticasone-Salmeterol (ADVAIR DISKUS) 250-50 MCG/DOSE AEPB Inhale 1 puff into the lungs 2 (two) times daily as needed (shortness of breath/wheezing).    Yes [provider]  levothyroxine (SYNTHROID, LEVOTHROID) 125 MCG tablet Take 125 mcg by mouth daily. 06/15/17  Yes [provider]  loratadine (CLARITIN) 10 MG tablet Take 10 mg by mouth daily.   Yes [provider]  MAGNESIUM PO Take 1 tablet by mouth at bedtime.   Yes [provider]  Melatonin 3 MG TABS Take 3 mg by mouth at bedtime.   Yes [provider]  metoprolol tartrate (LOPRESSOR) 50 MG tablet Take 25 mg by mouth 2 (two) times daily.   Yes [provider]  Multiple Vitamin (MULTIVITAMIN WITH MINERALS) TABS tablet Take 1 tablet by mouth daily.   Yes [provider]  nitroGLYCERIN  (NITROSTAT) 0.4 MG SL tablet Place 0.4 mg under the tongue every 5 (five) minutes as needed for chest pain.  11/12/11  Yes Burtis Junes, NP  Omega-3 Fatty Acids (FISH OIL) 500 MG CAPS Take 500 mg by mouth 2 (two) times daily.   Yes [provider]  OXYGEN Inhale 3-6 L into the lungs daily. 3 at rest, 4-6 with exertion   Yes [provider]  potassium chloride (K-DUR) 10 MEQ tablet Take 10 mEq by mouth at bedtime.    Yes [provider]  promethazine (PHENERGAN) 25 MG tablet Take 25 mg by mouth daily as needed for nausea or vomiting.  04/20/17  Yes [provider]  Psyllium (METAMUCIL PO) Take 1 Dose by mouth daily. Mix in liquid and drink    Yes [provider]  ranolazine (RANEXA) 1000 MG SR tablet Take 1 tablet (1,000 mg total) by mouth 2 (two) times daily. 09/23/18  Yes Deboraha Sprang, MD  sacubitril-valsartan (ENTRESTO) 49-51 MG Take 0.5 tablets by mouth 2 (two) times daily.   Yes [provider]  Tamsulosin HCl (FLOMAX) 0.4 MG CAPS Take 0.4 mg by mouth at bedtime.    Yes [provider]  Tiotropium Bromide Monohydrate (SPIRIVA RESPIMAT) 2.5 MCG/ACT AERS Inhale 2 puffs into the lungs every morning.   Yes [provider]  torsemide (DEMADEX) 20 MG tablet Take 1 tablet by mouth once daily Patient taking differently: Take 20 mg by mouth daily.  06/20/19  Yes Seiler, Amber K, NP  vitamin B-12 (CYANOCOBALAMIN) 1000 MCG tablet Take 1,000 mcg by mouth daily.   Yes [provider]  metoprolol tartrate (LOPRESSOR) 25 MG tablet Take 1 tablet (25 mg total) by mouth 2 (two) times daily. Patient not taking: Reported on 07/29/2019 09/19/17   [name], MD  sacubitril-valsartan (ENTRESTO) 24-26 MG Take 1 tablet by mouth 2 (two) times daily. Patient not taking: Reported on 07/29/2019 03/26/17   Deboraha Sprang, MD    Physical Exam: Vitals:   07/29/19 1339 07/29/19 1400 07/29/19 1445 07/29/19 1522  BP:  123/73  119/67  Pulse:   (!) 59 60 60  Resp:  14 15 16   Temp: 98.2 F (36.8 C)     TempSrc: Oral     SpO2:  95% 95% 90%    Constitutional: NAD, calm, comfortable Vitals:  07/29/19 1339 07/29/19 1400 07/29/19 1445 07/29/19 1522  BP:  123/73  119/67  Pulse:  (!) 59 60 60  Resp:  14 15 16   Temp: 98.2 F (36.8 C)     TempSrc: Oral     SpO2:  95% 95% 90%   Constitutional: Not in acute distress, communicating well.  On 4 L of oxygen via nasal cannula.  Alert and oriented x3.   Eyes: PERRL, lids and conjunctivae normal ENMT: Mucous membranes are moist. Posterior pharynx clear of any exudate or lesions.Normal dentition.  Neck: normal, supple, no masses, no thyromegaly Respiratory: clear to auscultation bilaterally, no wheezing, no crackles. Normal respiratory effort. No accessory muscle use.  Cardiovascular: Regular rate and rhythm, no murmurs / rubs / gallops.  Bilateral 1+ pitting edema positive in lower extremities.. 2+ pedal pulses. No carotid bruits.  Abdomen: no tenderness, no masses palpated. No hepatosplenomegaly. Bowel sounds positive.  Musculoskeletal: no clubbing / cyanosis. No joint deformity upper and lower extremities. Good ROM, no contractures. Normal muscle tone.  Skin: Multiple bruises noted on right arm and left arm.   Neurologic: CN 2-12 grossly intact. Sensation intact, DTR normal. Strength 5/5 in all 4.  Psychiatric: Normal judgment and insight. Alert and oriented x 3. Normal mood.    Labs on Admission: I have personally reviewed following labs and imaging studies  CBC: Recent Labs  Lab 07/29/19 1334  WBC 6.8  NEUTROABS 4.9  HGB 12.4*  HCT 36.1*  MCV 97.3  PLT 99991111   Basic Metabolic Panel: Recent Labs  Lab 07/29/19 1334  NA 130*  K 5.3*  CL 95*  CO2 26  GLUCOSE 121*  BUN 20  CREATININE 1.82*  CALCIUM 8.4*  MG 2.1  PHOS 3.3   GFR: CrCl cannot be calculated (Unknown ideal weight.). Liver Function Tests: Recent Labs  Lab 07/29/19 1334  AST 63*  ALT 15  ALKPHOS 47   BILITOT 1.7*  PROT 5.3*  ALBUMIN 3.1*   Recent Labs  Lab 07/29/19 1334  LIPASE 17   Recent Labs  Lab 07/29/19 1611  AMMONIA 44*   Coagulation Profile: No results for input(s): INR, PROTIME in the last 168 hours. Cardiac Enzymes: No results for input(s): CKTOTAL, CKMB, CKMBINDEX, TROPONINI in the last 168 hours. BNP (last 3 results) No results for input(s): PROBNP in the last 8760 hours. HbA1C: No results for input(s): HGBA1C in the last 72 hours. CBG: No results for input(s): GLUCAP in the last 168 hours. Lipid Profile: No results for input(s): CHOL, HDL, LDLCALC, TRIG, CHOLHDL, LDLDIRECT in the last 72 hours. Thyroid Function Tests: No results for input(s): TSH, T4TOTAL, FREET4, T3FREE, THYROIDAB in the last 72 hours. Anemia Panel: No results for input(s): VITAMINB12, FOLATE, FERRITIN, TIBC, IRON, RETICCTPCT in the last 72 hours. Urine analysis:    Component Value Date/Time   COLORURINE YELLOW 07/29/2019 [place] 07/29/2019 1510   LABSPEC 1.011 07/29/2019 1510   PHURINE 5.0 07/29/2019 1510   GLUCOSEU NEGATIVE 07/29/2019 1510   HGBUR NEGATIVE 07/29/2019 [place] 07/29/2019 1510   KETONESUR NEGATIVE 07/29/2019 1510   PROTEINUR NEGATIVE 07/29/2019 1510   UROBILINOGEN 0.2 07/18/2015 1154   NITRITE NEGATIVE 07/29/2019 1510   LEUKOCYTESUR NEGATIVE 07/29/2019 1510    Radiological Exams on Admission: Dg Chest 2 View  Result Date: 07/29/2019 CLINICAL DATA:  Increased weakness, tremors, vomiting, history COPD, atrial fibrillation, hypertension, coronary artery disease post MI, CHF, non ischemic cardiomyopathy EXAM: CHEST - 2 VIEW COMPARISON:  04/13/2019, 04/21/2018  FINDINGS: LEFT subclavian AICD with leads projecting at RIGHT atrium and RIGHT ventricle. Normal heart size, mediastinal contours, and pulmonary vascularity. Atherosclerotic calcification aorta. Bibasilar atelectasis and chronic accentuation of interstitial markings greater on  LEFT, appearance unchanged from prior exams. No definite acute infiltrate, pleural effusion or pneumothorax. Bones demineralized. IMPRESSION: Chronic bibasilar changes greater on LEFT. No acute abnormalities. Electronically Signed   By: Lavonia Dana M.D.   On: 07/29/2019 14:45   Ct Head Wo Contrast  Result Date: 07/29/2019 CLINICAL DATA:  Weakness.  Worsening tremors. EXAM: CT HEAD WITHOUT CONTRAST TECHNIQUE: Contiguous axial images were obtained from the base of the skull through the vertex without intravenous contrast. COMPARISON:  10/11/2012; 02/29/2008 FINDINGS: Brain: Similar findings of mild atrophy and periventricular hypodensities compatible microvascular ischemic disease. Gray-white differentiation is otherwise well maintained. No CT evidence of acute large territory infarct. No intraparenchymal or extra-axial mass or hemorrhage. Unchanged size and configuration of the ventricles and the basilar cisterns. No midline shift. Vascular: Intracranial atherosclerosis. Skull: No displaced calvarial fracture. Sinuses/Orbits: Limited visualization of the paranasal sinuses and the mastoid air cells is normal. No air-fluid levels. Post bilateral cataract surgery. Other: Regional soft tissues appear normal. IMPRESSION: Similar findings of mild atrophy and microvascular ischemic disease without superimposed acute intracranial process. Electronically Signed   By: Sandi Mariscal M.D.   On: 07/29/2019 14:25    Assessment/Plan Principal Problem:   Generalized weakness Active Problems:   Chronic systolic heart failure (HCC)   Hyperlipidemia   Acute on chronic kidney failure (HCC)  Generalized weakness: -Associated with worsening of tremors.  Had one episode of vomiting this morning.  His symptoms could be secondary to dehydration?  No witnessed seizure or syncope as per wife. -No changes in medication recently, blood sugar: WNL.  CT head-negative for acute pathology. -No signs of infection, UA negative,  afebrile, chest x-ray is negative. -Placed patient under observation.  EKG ordered. -COVID-19 came back negative, troponin: Negative.  Ammonia level: Elevated -Ordered B12, vitamin D, TSH. -Zofran PRN for nausea and vomiting. -PT to eval tomorrow.  Chronic hypoxic respiratory failure: -Secondary to underlying COPD. -On 3 to 6 L of oxygen via nasal cannulae at home. -Continue albuterol, Advair, Spiriva -No worsening shortness of breath or wheezing on exam.  Chronic systolic congestive heart failure with ejection fraction of 25%: Stable -No worsening shortness of breath, orthopnea, PND or worsening leg swelling. -Has AICD -On continuous cardiac monitoring. -Continue home medication of aspirin, statin, metoprolol, amiodarone, Eliquis.  Hold Entresto for now due to hyperkalemia and worsening kidney function.  Acute on chronic kidney disease: -Could be secondary to dehydration due to vomiting. -Monitor kidney function closely.  Avoid nephrotoxic medication and NSAIDs. -Renal ultrasound showed mild bilateral parenchymal atrophy.  No hydronephrosis or shadowing stone.  Hyperkalemia: -Could be secondary to medication versus dehydration -No EKG changes. -Repeat BMP tomorrow a.m.  Elevated ammonia level: -Elevated AST and total bilirubin. -Unknown etiology. -Check hepatitis panel.  Right upper quadrant ultrasound revealed fatty liver.  Hyperlipidemia: Check lipid panel -Continue atorvastatin.  Hypothyroidism: Check TSH level -Continue levothyroxine 125 mcg once daily.  DVT prophylaxis: On Eliquis Code Status: Full code Family Communication: Wife present at bedside.  Plan of care discussed with patient in length and he verbalized understanding and agreed with it. Disposition Plan:  TBD Consults called: None Admission status: Observation  Mckinley Jewel MD [facility] Pager [phone]  If 7PM-7AM, please contact night-coverage [email or web address] Password [facility]   07/29/2019, 5:44 PM

## 2019-07-29 NOTE — ED Notes (Signed)
ED TO INPATIENT HANDOFF REPORT  ED Nurse Name and Phone #:   Lenice Pressman [phone]  S Name/Age/Gender Corey Tyler 78 y.o. male Room/Bed: [id number]  Code Status   Code Status: Full Code  Home/SNF/Other Home Patient oriented to: self, place, time and situation Is this baseline? Yes   Triage Complete: Triage complete  Chief Complaint tremors/gen weakness  Triage Note Pt arrived via [facility] from home with c/o increased weakness and tremors. Pt usually has tremors, but they were worse this morning. Pt wears 2-4Lof o2 at home.  Pt had an episode of vomiting on arrival. Given 4mg  of IV zofran.    Allergies Allergies  Allergen Reactions  . Xarelto [Rivaroxaban] Other (See Comments)    Bleeding  . Adhesive [Tape] Hives, Itching and Rash  . Atorvastatin Cough  . Codeine Nausea Only  . Isosorbide Nitrate Other (See Comments)    Made him feel bad  . Latex Itching and Other (See Comments)    Rash, itching, burning  . Levofloxacin Other (See Comments)    Tachycardia  . Lisinopril Cough  . Lorazepam Other (See Comments)    "felt bad"  . Mexiletine Other (See Comments)    GI distress  . Sertraline Other (See Comments)    Made him feel bad    Level of Care/Admitting Diagnosis ED Disposition    ED Disposition Condition Early: [facility] [100100]  Level of Care: Med-Surg [16]  I expect the patient will be discharged within 24 hours: Yes  LOW acuity---Tx typically complete <24 hrs---ACUTE conditions typically can be evaluated <24 hours---LABS likely to return to acceptable levels <24 hours---IS near functional baseline---EXPECTED to return to current living arrangement---NOT newly hypoxic: Meets criteria for 5C-Observation unit  Covid Evaluation: Confirmed COVID Negative  Diagnosis: AKI (acute kidney injury) [id number]  Admitting Physician: Mckinley Jewel [id number]  Attending Physician: Mckinley Jewel [phone]  PT Class (Do  Not Modify): Observation [104]  PT Acc Code (Do Not Modify): Observation [10022]       B Medical/Surgery History Past Medical History:  Diagnosis Date  . A-fib ([place])    a. Noted on 12/2012 interrogation, placed on Apixaban and ultimately stopped NOACs 2/2 personal decision 8/14.  Marland Kitchen AICD (automatic cardioverter/defibrillator) present    Cardiac defibrillator -dual  [employer]  . Arthritis   . Atrial tachycardia-non sustained    a. Noted on 03/2012 interrogation.  . Chronic systolic heart failure (HCC)    EF down to 20 to 25% per echo November 2012  . COPD (chronic obstructive pulmonary disease) ([place])   . Hyperlipidemia   . Hypertension   . ICD (implantable cardiac defibrillator) in place   . NICM (nonischemic cardiomyopathy) ([place])    a. Normal cors 2000. b. Minimal plaque 2013.  . Old myocardial infarction    a. ?Silent MI. b. Large fixed inferolateral defect c/w with prior infarct, no ischemia. c. Cath in 2000/2013 with only minimal CAD.  Marland Kitchen Presence of permanent cardiac pacemaker   . Pulmonary nodule, right    Last scan in 2010 showing stability; felt to be benign.  Marland Kitchen PVC's (premature ventricular contractions)   . Ventricular tachycardia, polymorphic ([place])    Rx via ICD 12/14   Past Surgical History:  Procedure Laterality Date  . APPENDECTOMY    . CARDIAC CATHETERIZATION  2000  . CARDIAC DEFIBRILLATOR PLACEMENT    . CARDIOVERSION  03/30/2013   Procedure: CARDIOVERSION;  Surgeon: Wonda Cheng Nahser,  MD;  Location: [facility];  Service: Cardiovascular;;  . COLON SURGERY    . COLONOSCOPY N/A 09/25/2013   Procedure: COLONOSCOPY;  Surgeon: Jerene Bears, MD;  Location: [facility];  Service: Endoscopy;  Laterality: N/A;  . ICD  12/2011   [name]  . IMPLANTABLE CARDIOVERTER DEFIBRILLATOR IMPLANT N/A 01/06/2012   Procedure: IMPLANTABLE CARDIOVERTER DEFIBRILLATOR IMPLANT;  Surgeon: Deboraha Sprang, MD;  Location: [facility] CATH LAB;  Service: Cardiovascular;  Laterality: N/A;  . PACEMAKER IMPLANT      [name]  . TEE WITHOUT CARDIOVERSION N/A 03/30/2013   Procedure: TRANSESOPHAGEAL ECHOCARDIOGRAM (TEE);  Surgeon: Thayer Headings, MD;  Location: [facility];  Service: Cardiovascular;  Laterality: N/A;  . V TACH ABLATION  06/10/2017  . V TACH ABLATION N/A 06/10/2017   Procedure: V Tach Ablation;  Surgeon: Evans Lance, MD;  Location: [facility] CV LAB;  Service: Cardiovascular;  Laterality: N/A;     A IV Location/Drains/Wounds Patient Lines/Drains/Airways Status   Active Line/Drains/Airways    Name:   Placement date:   Placement time:   Site:   Days:   Peripheral IV 07/29/19 Right Wrist   07/29/19    1300    Wrist   less than 1          Intake/Output Last 24 hours No intake or output data in the 24 hours ending 07/29/19 2005  Labs/Imaging Results for orders placed or performed during the hospital encounter of 07/29/19 (from the past 48 hour(s))  CBC with Differential     Status: Abnormal   Collection Time: 07/29/19  1:34 PM  Result Value Ref Range   WBC 6.8 4.0 - 10.5 K/uL   RBC 3.71 (L) 4.22 - 5.81 MIL/uL   Hemoglobin 12.4 (L) 13.0 - 17.0 g/dL   HCT 36.1 (L) 39.0 - 52.0 %   MCV 97.3 80.0 - 100.0 fL   MCH 33.4 26.0 - 34.0 pg   MCHC 34.3 30.0 - 36.0 g/dL   RDW 13.8 11.5 - 15.5 %   Platelets 198 150 - 400 K/uL   nRBC 0.0 0.0 - 0.2 %   Neutrophils Relative % 74 %   Neutro Abs 4.9 1.7 - 7.7 K/uL   Lymphocytes Relative 20 %   Lymphs Abs 1.3 0.7 - 4.0 K/uL   Monocytes Relative 6 %   Monocytes Absolute 0.4 0.1 - 1.0 K/uL   Eosinophils Relative 0 %   Eosinophils Absolute 0.0 0.0 - 0.5 K/uL   Basophils Relative 0 %   Basophils Absolute 0.0 0.0 - 0.1 K/uL   Immature Granulocytes 0 %   Abs Immature Granulocytes 0.03 0.00 - 0.07 K/uL    Comment: Performed at [facility], [address] [place], [place] [address]  Comprehensive metabolic panel     Status: Abnormal   Collection Time: 07/29/19  1:34 PM  Result Value Ref Range   Sodium 130 (L) 135 - 145 mmol/L    Potassium 5.3 (H) 3.5 - 5.1 mmol/L   Chloride 95 (L) 98 - 111 mmol/L   CO2 26 22 - 32 mmol/L   Glucose, Bld 121 (H) 70 - 99 mg/dL   BUN 20 8 - 23 mg/dL   Creatinine, Ser 1.82 (H) 0.61 - 1.24 mg/dL   Calcium 8.4 (L) 8.9 - 10.3 mg/dL   Total Protein 5.3 (L) 6.5 - 8.1 g/dL   Albumin 3.1 (L) 3.5 - 5.0 g/dL   AST 63 (H) 15 - 41 U/L   ALT 15 0 -  44 U/L   Alkaline Phosphatase 47 38 - 126 U/L   Total Bilirubin 1.7 (H) 0.3 - 1.2 mg/dL   GFR calc non Af Amer 35 (L) >60 mL/min   GFR calc Af Amer 40 (L) >60 mL/min   Anion gap 9 5 - 15    Comment: Performed at [facility] [address], [place], [place] [address]  Lipase, blood     Status: None   Collection Time: 07/29/19  1:34 PM  Result Value Ref Range   Lipase 17 11 - 51 U/L    Comment: Performed at [facility] [address] [place], [place] [address]  Magnesium     Status: None   Collection Time: 07/29/19  1:34 PM  Result Value Ref Range   Magnesium 2.1 1.7 - 2.4 mg/dL    Comment: Performed at [facility] [address], [place], [place] [address]  Phosphorus     Status: None   Collection Time: 07/29/19  1:34 PM  Result Value Ref Range   Phosphorus 3.3 2.5 - 4.6 mg/dL    Comment: Performed at [facility] [address], [place], [place] [address]  Troponin I (High Sensitivity)     Status: None   Collection Time: 07/29/19  1:34 PM  Result Value Ref Range   Troponin I (High Sensitivity) 11 <18 ng/L    Comment: (NOTE) Elevated high sensitivity troponin I (hsTnI) values and significant  changes across serial measurements may suggest ACS but many other  chronic and acute conditions are known to elevate hsTnI results.  Refer to the "Links" section for chest pain algorithms and additional  guidance. Performed at [facility], [place] [address] [place], [place] [address]   Urinalysis, Routine w reflex microscopic     Status: None   Collection Time: 07/29/19  3:10 PM  Result Value Ref Range    Color, Urine YELLOW YELLOW   APPearance CLEAR CLEAR   Specific Gravity, Urine 1.011 1.005 - 1.030   pH 5.0 5.0 - 8.0   Glucose, UA NEGATIVE NEGATIVE mg/dL   Hgb urine dipstick NEGATIVE NEGATIVE   Bilirubin Urine NEGATIVE NEGATIVE   Ketones, ur NEGATIVE NEGATIVE mg/dL   Protein, ur NEGATIVE NEGATIVE mg/dL   Nitrite NEGATIVE NEGATIVE   Leukocytes,Ua NEGATIVE NEGATIVE    Comment: Performed at [facility] [address], [place], [place] [address]  Brain natriuretic peptide     Status: None   Collection Time: 07/29/19  3:14 PM  Result Value Ref Range   B Natriuretic Peptide 66.6 0.0 - 100.0 pg/mL    Comment: Performed at [facility] [address], [place], [place] [address]  SARS Coronavirus 2 [facility] order, Performed in [facility] hospital lab) Nasopharyngeal Nasopharyngeal Swab     Status: None   Collection Time: 07/29/19  3:14 PM   Specimen: Nasopharyngeal Swab  Result Value Ref Range   SARS Coronavirus 2 NEGATIVE NEGATIVE    Comment: (NOTE) If result is NEGATIVE SARS-CoV-2 target nucleic acids are NOT DETECTED. The SARS-CoV-2 RNA is generally detectable in upper and lower  respiratory specimens during the acute phase of infection. The lowest  concentration of SARS-CoV-2 viral copies this assay can detect is 250  copies / mL. A negative result does not preclude SARS-CoV-2 infection  and should not be used as the sole basis for treatment or other  patient management decisions.  A negative result may occur with  improper specimen collection / handling, submission of specimen other  than nasopharyngeal swab, presence of viral mutation(s) within the  areas targeted by this assay, and inadequate number of viral copies  (<250 copies / mL). A negative result must be combined with clinical  observations, patient history, and epidemiological information. If result is POSITIVE SARS-CoV-2 target nucleic acids are DETECTED. The SARS-CoV-2 RNA is generally detectable in  upper and lower  respiratory specimens dur ing the acute phase of infection.  Positive  results are indicative of active infection with SARS-CoV-2.  Clinical  correlation with patient history and other diagnostic information is  necessary to determine patient infection status.  Positive results do  not rule out bacterial infection or co-infection with other viruses. If result is PRESUMPTIVE POSTIVE SARS-CoV-2 nucleic acids MAY BE PRESENT.   A presumptive positive result was obtained on the submitted specimen  and confirmed on repeat testing.  While 2019 novel coronavirus  (SARS-CoV-2) nucleic acids may be present in the submitted sample  additional confirmatory testing may be necessary for epidemiological  and / or clinical management purposes  to differentiate between  SARS-CoV-2 and other Sarbecovirus currently known to infect humans.  If clinically indicated additional testing with an alternate test  methodology [phone]) is advised. The SARS-CoV-2 RNA is generally  detectable in upper and lower respiratory sp ecimens during the acute  phase of infection. The expected result is Negative. Fact Sheet for Patients:  StrictlyIdeas.no Fact Sheet for Healthcare Providers: BankingDealers.co.za This test is not yet approved or cleared by the [place] FDA and has been authorized for detection and/or diagnosis of SARS-CoV-2 by FDA under an Emergency Use Authorization (EUA).  This EUA will remain in effect (meaning this test can be used) for the duration of the COVID-19 declaration under Section 564(b)(1) of the Act, 21 U.S.C. section 360bbb-3(b)(1), unless the authorization is terminated or revoked sooner. Performed at [facility], [place] [address] [place], [place] [address]   Troponin I (High Sensitivity)     Status: None   Collection Time: 07/29/19  3:16 PM  Result Value Ref Range   Troponin I (High Sensitivity) 12 <18 ng/L     Comment: (NOTE) Elevated high sensitivity troponin I (hsTnI) values and significant  changes across serial measurements may suggest ACS but many other  chronic and acute conditions are known to elevate hsTnI results.  Refer to the "Links" section for chest pain algorithms and additional  guidance. Performed at [facility], [place] [address], [place], [place] [address]   Ammonia     Status: Abnormal   Collection Time: 07/29/19  4:11 PM  Result Value Ref Range   Ammonia 44 (H) 9 - 35 umol/L    Comment: Performed at [facility], [place] [address] [place], [place] [address]   Dg Chest 2 View  Result Date: 07/29/2019 CLINICAL DATA:  Increased weakness, tremors, vomiting, history COPD, atrial fibrillation, hypertension, coronary artery disease post MI, CHF, non ischemic cardiomyopathy EXAM: CHEST - 2 VIEW COMPARISON:  04/13/2019, 04/21/2018 FINDINGS: LEFT subclavian AICD with leads projecting at RIGHT atrium and RIGHT ventricle. Normal heart size, mediastinal contours, and pulmonary vascularity. Atherosclerotic calcification aorta. Bibasilar atelectasis and chronic accentuation of interstitial markings greater on LEFT, appearance unchanged from prior exams. No definite acute infiltrate, pleural effusion or pneumothorax. Bones demineralized. IMPRESSION: Chronic bibasilar changes greater on LEFT. No acute abnormalities. Electronically Signed   By: Lavonia Dana M.D.   On: 07/29/2019 14:45   Ct  Head Wo Contrast  Result Date: 07/29/2019 CLINICAL DATA:  Weakness.  Worsening tremors. EXAM: CT HEAD WITHOUT CONTRAST TECHNIQUE: Contiguous axial images were obtained from the base of the skull through the vertex without intravenous contrast. COMPARISON:  10/11/2012; 02/29/2008 FINDINGS: Brain: Similar findings of mild atrophy and periventricular hypodensities compatible microvascular ischemic disease. Gray-white differentiation is otherwise well maintained. No CT evidence of acute large territory infarct.  No intraparenchymal or extra-axial mass or hemorrhage. Unchanged size and configuration of the ventricles and the basilar cisterns. No midline shift. Vascular: Intracranial atherosclerosis. Skull: No displaced calvarial fracture. Sinuses/Orbits: Limited visualization of the paranasal sinuses and the mastoid air cells is normal. No air-fluid levels. Post bilateral cataract surgery. Other: Regional soft tissues appear normal. IMPRESSION: Similar findings of mild atrophy and microvascular ischemic disease without superimposed acute intracranial process. Electronically Signed   By: Sandi Mariscal M.D.   On: 07/29/2019 14:25   US Renal  Result Date: 07/29/2019 CLINICAL DATA:  78 year old male with acute renal insufficiency. EXAM: RENAL / URINARY TRACT ULTRASOUND COMPLETE COMPARISON:  CT of the abdomen pelvis dated 01/26/2018 FINDINGS: Right Kidney: Renal measurements: 9.7 x 4.7 x 3.9 cm = volume: 94.3 mL. There is mild parenchyma atrophy. Normal echogenicity. No hydronephrosis or shadowing stone. Left Kidney: Renal measurements: 9.4 x 5.4 x 3.9 cm = volume: 103 mL. Mild parenchyma atrophy and cortical thinning. No hydronephrosis or shadowing stone. Normal echogenicity. Bladder: Appears normal for degree of bladder distention. IMPRESSION: Mild bilateral parenchyma atrophy. No hydronephrosis or shadowing stone. Electronically Signed   By: Anner Crete M.D.   On: 07/29/2019 18:29   US Abdomen Limited Ruq  Result Date: 07/29/2019 CLINICAL DATA:  78 year old male with elevated LFTs. EXAM: ULTRASOUND ABDOMEN LIMITED RIGHT UPPER QUADRANT COMPARISON:  CT of the abdomen pelvis dated 01/26/2018 FINDINGS: Gallbladder: No gallstones or wall thickening visualized. No sonographic Murphy sign noted by sonographer. Common bile duct: Diameter: 6 mm Liver: There is diffuse increased liver echogenicity most commonly seen in the setting fatty infiltration. Superimposed inflammation or fibrosis is not excluded. Clinical correlation is  recommended. Portal vein is patent on color Doppler imaging with normal direction of blood flow towards the liver. Other: None. IMPRESSION: 1. No gallstone. 2. Fatty liver. Electronically Signed   By: Anner Crete M.D.   On: 07/29/2019 18:35    Pending Labs Unresulted Labs (From admission, onward)    Start     Ordered   07/30/19 0500  Comprehensive metabolic panel  Tomorrow morning,   R     07/29/19 1721   07/30/19 0500  CBC  Tomorrow morning,   R     07/29/19 1721   07/29/19 1740  Hepatitis panel, acute  Once,   STAT     07/29/19 1739   07/29/19 1726  VITAMIN D 25 Hydroxy (Vit-D Deficiency, Fractures)  Once,   STAT     07/29/19 1725   07/29/19 1725  Vitamin B12  Once,   STAT     07/29/19 1725   07/29/19 1721  TSH  Once,   STAT     07/29/19 1721   07/29/19 1317  Urine culture  ONCE - STAT,   STAT     07/29/19 1316          Vitals/Pain Today's Vitals   07/29/19 1630 07/29/19 1700 07/29/19 1915 07/29/19 1930  BP: (!) 146/78 (!) 109/59  124/82  Pulse: 61 63 (!) 59   Resp: (!) 26 18 18  (!) 21  Temp:  TempSrc:      SpO2: 96% 92% 97%   PainSc:        Isolation Precautions No active isolations  Medications Medications  acetaminophen (TYLENOL) tablet 650 mg (has no administration in time range)    Or  acetaminophen (TYLENOL) suppository 650 mg (has no administration in time range)  ondansetron (ZOFRAN) tablet 4 mg (has no administration in time range)    Or  ondansetron (ZOFRAN) injection 4 mg (has no administration in time range)  metoprolol tartrate (LOPRESSOR) tablet 25 mg (has no administration in time range)  aspirin EC tablet 325 mg (has no administration in time range)  ranolazine (RANEXA) 12 hr tablet 1,000 mg (has no administration in time range)  torsemide (DEMADEX) tablet 20 mg (has no administration in time range)  levothyroxine (SYNTHROID) tablet 125 mcg (has no administration in time range)  pantoprazole (PROTONIX) EC tablet 40 mg (has no  administration in time range)  finasteride (PROSCAR) tablet 5 mg (has no administration in time range)  tamsulosin (FLOMAX) capsule 0.4 mg (has no administration in time range)  apixaban (ELIQUIS) tablet 5 mg (has no administration in time range)  Magnesium TABS (has no administration in time range)  albuterol (PROVENTIL) (2.5 MG/3ML) 0.083% nebulizer solution 2.5 mg (has no administration in time range)  mometasone-formoterol (DULERA) 200-5 MCG/ACT inhaler 2 puff (has no administration in time range)  Tiotropium Bromide Monohydrate AERS 2 puff (has no administration in time range)  amiodarone (PACERONE) tablet 200 mg (has no administration in time range)  ondansetron (ZOFRAN) 4 MG/2ML injection (4 mg  Given 07/29/19 1316)    Mobility walks with person assist Moderate fall risk   Focused Assessments    R Recommendations: See Admitting Provider Note  Report given to:   Additional Notes:

## 2019-07-29 NOTE — ED Notes (Signed)
Patient transported to CT 

## 2019-07-29 NOTE — ED Provider Notes (Signed)
[facility] EMERGENCY DEPARTMENT Provider Note   CSN: [id number] Arrival date & time: 07/29/19  1255     History   Chief Complaint Chief Complaint  Patient presents with  . Weakness  . Tremors    HPI Corey Tyler is a 78 y.o. male presenting for evaluation of generalized weakness and tremors.  Patient states he has had increased weakness and tremors since he woke up at 7:00 this morning.  Patient states he is so weak that he was unable to stand.  He denies pain.  He denies feeling poorly yesterday.  Patient reports one episode of emesis this morning at home, and another upon arrival to the ED.  Additionally, patient states he is having some difficulty with word finding, but this is not new.  He denies fevers, chills, chest pain, shortness of breath, new cough, abdominal pain, urinary symptoms, abnormal bowel movements.  He has been taking all of his medication as prescribed.  No recent change in his medications.  He is on oxygen at baseline for COPD, has been using his normal amount of oxygen.  Patient states he has a mild tremor at baseline, but it is significantly worse today.  He denies alcohol or drug use.  He is a former smoker.  Additional history obtained from chart review.  Patient with a history of hypertension, hyperlipidemia, cardiomyopathy, A. fib on Eliquis, CHF, COPD on oxygen, history of MI with AICD in place.     HPI  Past Medical History:  Diagnosis Date  . A-fib ([place])    a. Noted on 12/2012 interrogation, placed on Apixaban and ultimately stopped NOACs 2/2 personal decision 8/14.  Marland Kitchen AICD (automatic cardioverter/defibrillator) present    Cardiac defibrillator -dual  [employer]  . Arthritis   . Atrial tachycardia-non sustained    a. Noted on 03/2012 interrogation.  . Chronic systolic heart failure (HCC)    EF down to 20 to 25% per echo November 2012  . COPD (chronic obstructive pulmonary disease) ([place])   . Hyperlipidemia   . Hypertension   . ICD  (implantable cardiac defibrillator) in place   . NICM (nonischemic cardiomyopathy) ([place])    a. Normal cors 2000. b. Minimal plaque 2013.  . Old myocardial infarction    a. ?Silent MI. b. Large fixed inferolateral defect c/w with prior infarct, no ischemia. c. Cath in 2000/2013 with only minimal CAD.  Marland Kitchen Presence of permanent cardiac pacemaker   . Pulmonary nodule, right    Last scan in 2010 showing stability; felt to be benign.  Marland Kitchen PVC's (premature ventricular contractions)   . Ventricular tachycardia, polymorphic ([place])    Rx via ICD 12/14    Patient Active Problem List   Diagnosis Date Noted  . Acute respiratory failure ([place]) 09/15/2017  . COPD exacerbation ([place]) 09/15/2017  . Sustained ventricular tachycardia ([place]) 06/10/2017  . VT (ventricular tachycardia) ([place]) 11/02/2016  . Benign fibroma of prostate 04/02/2015  . Ventricular tachycardia, polymorphic ([place])   . Diverticulosis of colon without hemorrhage 09/25/2013  . Atrial fibrillation ([place]) 12/29/2012  . Cardiac defibrillator -dual  [employer]   . Hyperlipidemia   . PVCs 11/26/2011  . Chronic systolic heart failure ([place]) 10/07/2011  . Cardiomyopathy, nonischemic and ischemic 09/24/2011  . HYPERTENSION 12/18/2009  . 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 PULMONARY NODULE 12/04/2008    Past Surgical History:  Procedure Laterality Date  . APPENDECTOMY    . CARDIAC CATHETERIZATION  2000  .  CARDIAC DEFIBRILLATOR PLACEMENT    . CARDIOVERSION  03/30/2013   Procedure: CARDIOVERSION;  Surgeon: Thayer Headings, MD;  Location: [facility];  Service: Cardiovascular;;  . COLON SURGERY    . COLONOSCOPY N/A 09/25/2013   Procedure: COLONOSCOPY;  Surgeon: Jerene Bears, MD;  Location: [facility];  Service: Endoscopy;  Laterality: N/A;  . ICD  12/2011   [name]  . IMPLANTABLE CARDIOVERTER DEFIBRILLATOR IMPLANT N/A 01/06/2012   Procedure: IMPLANTABLE CARDIOVERTER DEFIBRILLATOR IMPLANT;  Surgeon:  Deboraha Sprang, MD;  Location: [facility] CATH LAB;  Service: Cardiovascular;  Laterality: N/A;  . PACEMAKER IMPLANT     [name]  . TEE WITHOUT CARDIOVERSION N/A 03/30/2013   Procedure: TRANSESOPHAGEAL ECHOCARDIOGRAM (TEE);  Surgeon: Thayer Headings, MD;  Location: [facility];  Service: Cardiovascular;  Laterality: N/A;  . V TACH ABLATION  06/10/2017  . V TACH ABLATION N/A 06/10/2017   Procedure: V Tach Ablation;  Surgeon: Evans Lance, MD;  Location: [facility] CV LAB;  Service: Cardiovascular;  Laterality: N/A;        Home Medications    Prior to Admission medications   Medication Sig Start Date End Date Taking? Authorizing Provider  acetaminophen (TYLENOL) 500 MG tablet Take 500 mg by mouth every 6 (six) hours as needed for headache (pain).   Yes [provider]  albuterol (VENTOLIN HFA) 108 (90 Base) MCG/ACT inhaler Inhale 2 puffs into the lungs every 6 (six) hours as needed for wheezing or shortness of breath.   Yes [provider]  aspirin EC 325 MG tablet Take 325 mg by mouth once.   Yes [provider]  doxycycline (VIBRAMYCIN) 100 MG capsule Take 1 capsule (100 mg total) by mouth 2 (two) times daily. 04/13/19  Yes Isla Pence, MD  ELIQUIS 5 MG TABS tablet TAKE 1 TABLET BY MOUTH TWICE A DAY Patient taking differently: Take 5 mg by mouth 2 (two) times daily.  07/30/15  Yes Minus Breeding, MD  Melatonin 3 MG TABS Take 3 mg by mouth at bedtime.   Yes [provider]  Multiple Vitamin (MULTIVITAMIN WITH MINERALS) TABS tablet Take 1 tablet by mouth daily.   Yes [provider]  Omega-3 Fatty Acids (FISH OIL) 500 MG CAPS Take 500 mg by mouth 2 (two) times daily.   Yes [provider]  OXYGEN Inhale 3-6 L into the lungs daily. 3 at rest, 4-6 with exertion   Yes [provider]  Psyllium (METAMUCIL PO) Take 1 Dose by mouth daily. Mix in liquid and drink    Yes [provider]  vitamin B-12 (CYANOCOBALAMIN) 1000 MCG  tablet Take 1,000 mcg by mouth daily.   Yes [provider]  amiodarone (PACERONE) 200 MG tablet Take 2 tablets (400 mg total) by mouth daily. Take 400mg  by mouth daily for 1 month, then resume 200mg  by mouth daily dose. Patient taking differently: Take 200 mg by mouth daily.  12/02/17   Deboraha Sprang, MD  Carboxymethylcellul-Glycerin (LUBRICATING EYE DROPS OP) Apply 1 drop to eye daily as needed (dry eyes).    [provider]  cholecalciferol (VITAMIN D) 1000 UNITS tablet Take 1,000 Units by mouth daily.    [provider]  finasteride (PROSCAR) 5 MG tablet Take 5 mg by mouth daily.     [provider]  fluticasone (FLONASE) 50 MCG/ACT nasal spray Place 1 spray into both nostrils daily as needed for allergies.  09/08/17   [provider]  Fluticasone-Salmeterol (ADVAIR DISKUS) 250-50 MCG/DOSE  AEPB Inhale 1 puff into the lungs 2 (two) times daily.    [provider]  levothyroxine (SYNTHROID, LEVOTHROID) 125 MCG tablet Take 125 mcg by mouth daily. 06/15/17   [provider]  metoprolol tartrate (LOPRESSOR) 25 MG tablet Take 1 tablet (25 mg total) by mouth 2 (two) times daily. 09/19/17   [name], MD  nitroGLYCERIN (NITROSTAT) 0.4 MG SL tablet Place 0.4 mg under the tongue every 5 (five) minutes as needed for chest pain.  11/12/11   Burtis Junes, NP  potassium chloride SA (K-DUR,KLOR-CON) 20 MEQ tablet Take 20 mEq by mouth at bedtime.    [provider]  promethazine (PHENERGAN) 25 MG tablet Take 25 mg by mouth daily as needed for nausea or vomiting.  04/20/17   [provider]  ranolazine (RANEXA) 1000 MG SR tablet Take 1 tablet (1,000 mg total) by mouth 2 (two) times daily. 09/23/18   Deboraha Sprang, MD  sacubitril-valsartan (ENTRESTO) 24-26 MG Take 1 tablet by mouth 2 (two) times daily. 03/26/17   Deboraha Sprang, MD  Tamsulosin HCl (FLOMAX) 0.4 MG CAPS Take 0.4 mg by mouth at bedtime.     [provider]   tiotropium (SPIRIVA) 18 MCG inhalation capsule Place 18 mcg into inhaler and inhale 2 (two) times daily.    [provider]  torsemide (DEMADEX) 20 MG tablet Take 1 tablet by mouth once daily 06/20/19   Patsey Berthold, NP    Family History Family History  Problem Relation Age of Onset  . Emphysema Mother   . Heart disease Mother        AVR  . Heart failure Father        Died agwe 64  . Esophageal cancer Brother   . Colon cancer Neg Hx     Social History Social History   Tobacco Use  . Smoking status: Former Smoker    Packs/day: 2.50    Years: 40.00    Pack years: 100.00    Types: Cigarettes    Quit date: 11/24/1983    Years since quitting: 35.7  . Smokeless tobacco: Never Used  . Tobacco comment: 2 1/2 ppd x 40 years  Substance Use Topics  . Alcohol use: No  . Drug use: No     Allergies   Xarelto [rivaroxaban], Adhesive [tape], Atorvastatin, Codeine, Isosorbide nitrate, Latex, Levofloxacin, Lisinopril, Lorazepam, Mexiletine, and Sertraline   Review of Systems Review of Systems  Gastrointestinal: Positive for vomiting.  Neurological: Positive for tremors and weakness.  All other systems reviewed and are negative.    Physical Exam Updated Vital Signs BP 119/67   Pulse 60   Temp 98.2 F (36.8 C) (Oral)   Resp 16   SpO2 90%   Physical Exam Vitals signs and nursing note reviewed.  Constitutional:      General: He is not in acute distress.    Appearance: He is well-developed.     Comments: Elderly male who appears to be having difficulty with memory and word finding, but is nontoxic.  HENT:     Head: Normocephalic and atraumatic.  Eyes:     Extraocular Movements: Extraocular movements intact.     Conjunctiva/sclera: Conjunctivae normal.     Pupils: Pupils are equal, round, and reactive to light.  Neck:     Musculoskeletal: Normal range of motion and neck supple.  Cardiovascular:     Rate and Rhythm: Normal rate and regular rhythm.     Pulses:  Normal pulses.  Pulmonary:     Effort: Pulmonary effort is normal. No respiratory distress.     Breath sounds: Wheezing present.     Comments: Expiratory wheezes in all fields.  Likely baseline.  Speaking in full sentences.  No signs of respiratory distress or accessory muscle use. Abdominal:     General: There is no distension.     Palpations: Abdomen is soft. There is no mass.     Tenderness: There is no abdominal tenderness. There is no guarding or rebound.  Musculoskeletal:     Right lower leg: Edema present.     Left lower leg: Edema present.     Comments: 1+ pitting edema bilaterally.  Swelling slightly worse on the left side, baseline per patient.  No tenderness of the lower extremities.  No appreciable unilateral weakness. Tremors noted in all extremities.  Skin:    General: Skin is warm and dry.     Capillary Refill: Capillary refill takes less than 2 seconds.  Neurological:     Mental Status: He is alert and oriented to person, place, and time.     Comments: Alert and oriented.  Difficulty with word finding and memory.      ED Treatments / Results  Labs (all labs ordered are listed, but only abnormal results are displayed) Labs Reviewed  CBC WITH DIFFERENTIAL/PLATELET - Abnormal; Notable for the following components:      Result Value   RBC 3.71 (*)    Hemoglobin 12.4 (*)    HCT 36.1 (*)    All other components within normal limits  COMPREHENSIVE METABOLIC PANEL - Abnormal; Notable for the following components:   Sodium 130 (*)    Potassium 5.3 (*)    Chloride 95 (*)    Glucose, Bld 121 (*)    Creatinine, Ser 1.82 (*)    Calcium 8.4 (*)    Total Protein 5.3 (*)    Albumin 3.1 (*)    AST 63 (*)    Total Bilirubin 1.7 (*)    GFR calc non Af Amer 35 (*)    GFR calc Af Amer 40 (*)    All other components within normal limits  URINE CULTURE  SARS CORONAVIRUS 2 (HOSPITAL ORDER, [facility])  LIPASE, BLOOD  MAGNESIUM  PHOSPHORUS   URINALYSIS, ROUTINE W REFLEX MICROSCOPIC  AMMONIA  BRAIN NATRIURETIC PEPTIDE  TROPONIN I (HIGH SENSITIVITY)  TROPONIN I (HIGH SENSITIVITY)    EKG EKG Interpretation  Date/Time:  Saturday July 29 2019 13:05:09 EDT Ventricular Rate:  67 PR Interval:    QRS Duration: 122 QT Interval:  457 QTC Calculation: 483 R Axis:   20 Text Interpretation:  Sinus rhythm Borderline prolonged PR interval Left bundle branch block No significant change since last tracing Confirmed by Gareth Morgan [phone]) on 07/29/2019 1:17:01 PM   Radiology Dg Chest 2 View  Result Date: 07/29/2019 CLINICAL DATA:  Increased weakness, tremors, vomiting, history COPD, atrial fibrillation, hypertension, coronary artery disease post MI, CHF, non ischemic cardiomyopathy EXAM: CHEST - 2 VIEW COMPARISON:  04/13/2019, 04/21/2018 FINDINGS: LEFT subclavian AICD with leads projecting at RIGHT atrium and RIGHT ventricle. Normal heart size, mediastinal contours, and pulmonary vascularity. Atherosclerotic calcification aorta. Bibasilar atelectasis and chronic accentuation of interstitial markings greater on LEFT, appearance unchanged from prior exams. No definite acute infiltrate, pleural effusion or pneumothorax. Bones demineralized. IMPRESSION: Chronic bibasilar changes greater on LEFT. No acute abnormalities. Electronically Signed   By: Lavonia Dana M.D.   On: 07/29/2019 14:45  Ct Head Wo Contrast  Result Date: 07/29/2019 CLINICAL DATA:  Weakness.  Worsening tremors. EXAM: CT HEAD WITHOUT CONTRAST TECHNIQUE: Contiguous axial images were obtained from the base of the skull through the vertex without intravenous contrast. COMPARISON:  10/11/2012; 02/29/2008 FINDINGS: Brain: Similar findings of mild atrophy and periventricular hypodensities compatible microvascular ischemic disease. Gray-white differentiation is otherwise well maintained. No CT evidence of acute large territory infarct. No intraparenchymal or extra-axial mass or  hemorrhage. Unchanged size and configuration of the ventricles and the basilar cisterns. No midline shift. Vascular: Intracranial atherosclerosis. Skull: No displaced calvarial fracture. Sinuses/Orbits: Limited visualization of the paranasal sinuses and the mastoid air cells is normal. No air-fluid levels. Post bilateral cataract surgery. Other: Regional soft tissues appear normal. IMPRESSION: Similar findings of mild atrophy and microvascular ischemic disease without superimposed acute intracranial process. Electronically Signed   By: Sandi Mariscal M.D.   On: 07/29/2019 14:25    Procedures Procedures (including critical care time)  Medications Ordered in ED Medications  ondansetron (ZOFRAN) 4 MG/2ML injection (4 mg  Given 07/29/19 1316)     Initial Impression / Assessment and Plan / ED Course  I have reviewed the triage vital signs and the nursing notes.  Pertinent labs & imaging results that were available during my care of the patient were reviewed by me and considered in my medical decision making (see chart for details).        Patient presenting for evaluation of generalized weakness and tremors.  Physical exam shows patient who is tremulous, but no other focal neurologic deficits.  Vomited upon arrival to the ED, but no abdominal pain.  No obvious infectious symptoms such as fever, cough, urinary symptoms.  However, considering his generalized weakness will obtain labs, chest x-ray, and UA to rule out infection that may be causing weakness or worsening tremors.  Case discussed with attending, Dr. Billy Fischer evaluated the patient.  Dr. Billy Fischer discussed with Dr. Leonel Ramsay from neurology, who recommended ruling out underlying medical causes that may exacerbate baseline tremors.  If no abnormality found, recommends reconsulting for formal consult.  Labs show stable hemoglobin.  Slight hyperkalemia at 5.3.  Creatinine mildly up from baseline, 1.8, baseline is 1.6.  Bili mildly elevated  at 1.7, although LFTs reassuring.  CT without acute abnormality.  Chest x-ray shows mild congestion.  Will add on BNP and ammonia.  Electrolytes are stable.  Urine pending.  Urine negative for infection. BNP negative.  Ammonia slightly elevated at 44, no baseline to compare. On reassessment, tremors are improved, but not completely resolved.  Attempted to relate patient with walker, even just sitting on the bed patient became significantly more tremulous and was unable to stand due to tremors.  I do not believe he is safe to go home, as he is nonambulatory at this time.  Will call for admission.  Discussed with Dr. Doristine Bosworth from triad hospitalist service, patient to be admitted.   Final Clinical Impressions(s) / ED Diagnoses   Final diagnoses:  Tremor of unknown origin  General weakness    ED Discharge Orders    None       Franchot Heidelberg, PA-C 07/29/19 1925    Gareth Morgan, MD 08/01/19 2345

## 2019-07-29 NOTE — ED Triage Notes (Addendum)
Pt arrived via [facility] from home with c/o increased weakness and tremors. Pt usually has tremors, but they were worse this morning. Pt wears 2-4Lof o2 at home.  Pt had an episode of vomiting on arrival. Given 4mg  of IV zofran.

## 2019-07-30 ENCOUNTER — Other Ambulatory Visit: Payer: Self-pay

## 2019-07-30 DIAGNOSIS — R531 Weakness: Secondary | ICD-10-CM | POA: Diagnosis not present

## 2019-07-30 LAB — COMPREHENSIVE METABOLIC PANEL
ALT: 19 U/L (ref 0–44)
AST: 17 U/L (ref 15–41)
Albumin: 2.8 g/dL — ABNORMAL LOW (ref 3.5–5.0)
Alkaline Phosphatase: 47 U/L (ref 38–126)
Anion gap: 8 (ref 5–15)
BUN: 19 mg/dL (ref 8–23)
CO2: 28 mmol/L (ref 22–32)
Calcium: 8.4 mg/dL — ABNORMAL LOW (ref 8.9–10.3)
Chloride: 98 mmol/L (ref 98–111)
Creatinine, Ser: 1.67 mg/dL — ABNORMAL HIGH (ref 0.61–1.24)
GFR calc Af Amer: 44 mL/min — ABNORMAL LOW (ref >60)
GFR calc non Af Amer: 38 mL/min — ABNORMAL LOW (ref >60)
Glucose, Bld: 92 mg/dL (ref 70–99)
Potassium: 3.5 mmol/L (ref 3.5–5.1)
Sodium: 134 mmol/L — ABNORMAL LOW (ref 135–145)
Total Bilirubin: 0.5 mg/dL (ref 0.3–1.2)
Total Protein: 5 g/dL — ABNORMAL LOW (ref 6.5–8.1)

## 2019-07-30 LAB — URINE CULTURE: Culture: <10000 — AB

## 2019-07-30 LAB — CBC
HCT: 33.8 % — ABNORMAL LOW (ref 39.0–52.0)
Hemoglobin: 11.6 g/dL — ABNORMAL LOW (ref 13.0–17.0)
MCH: 33.5 pg (ref 26.0–34.0)
MCHC: 34.3 g/dL (ref 30.0–36.0)
MCV: 97.7 fL (ref 80.0–100.0)
Platelets: 167 10*3/uL (ref 150–400)
RBC: 3.46 MIL/uL — ABNORMAL LOW (ref 4.22–5.81)
RDW: 13.7 % (ref 11.5–15.5)
WBC: 6.9 10*3/uL (ref 4.0–10.5)
nRBC: 0 % (ref 0.0–0.2)

## 2019-07-30 LAB — GLUCOSE, CAPILLARY: Glucose-Capillary: 89 mg/dL (ref 70–99)

## 2019-07-30 LAB — HEPATITIS PANEL, ACUTE
HCV Ab: <0.1 s/co ratio (ref 0.0–0.9)
Hep A IgM: NEGATIVE
Hep B C IgM: NEGATIVE
Hepatitis B Surface Ag: NEGATIVE

## 2019-07-30 MED ORDER — METOPROLOL TARTRATE 50 MG PO TABS: 25.0000 mg | ORAL_TABLET | Freq: Two times a day (BID) | ORAL | 0 refills | Status: DC

## 2019-07-30 MED ORDER — SACUBITRIL-VALSARTAN 24-26 MG PO TABS
1.0000 | ORAL_TABLET | Freq: Two times a day (BID) | ORAL | Status: DC
  Administered 2019-07-30: 1 via ORAL
  Filled 2019-07-30 (×2): qty 1

## 2019-07-30 NOTE — Progress Notes (Signed)
DISCHARGE NOTE HOME ERCIL GURKIN to be discharged Home per MD order. Discussed prescriptions and follow up appointments with the patient. Prescriptions given to patient; medication list explained in detail. Patient verbalized understanding.  Skin clean, dry and intact without evidence of skin break down, no evidence of skin tears noted. IV catheter discontinued intact. Site without signs and symptoms of complications. Dressing and pressure applied. Pt denies pain at the site currently. No complaints noted.  Patient free of lines, drains, and wounds.   An After Visit Summary (AVS) was printed and given to the patient. Patient escorted via wheelchair, and discharged home via private auto.  Orville Govern, RN

## 2019-07-30 NOTE — Evaluation (Signed)
Physical Therapy Evaluation Patient Details Name: Corey Tyler MRN: [id number] DOB: 10/12/1941 Today's Date: 07/30/2019   History of Present Illness  78 yo male with onset of weakness over several days and new tremors was admitted with N&V, resolved.  Has O2 at home, 3L at rest and up to 6L with mobility.  PMHx:  CHF, cardiomyopathy, COPD, CAD, MI, PACEMAKER AND AICD, R pulm nodule, atherosclerosis, a-fib with mult ablations, TEE,   Clinical Impression  Pt was seen for mobility and strengthening with notable improvement in his tremors and condition per pt and wife.  He was able to walk without sustaining a drop in O2 sats, with 6L O2 used per his MD recommendation.  Follow up with HHPT and after that should be assessed to return to cardiac rehab, as pt is wanting to resume that service.  See for PT acutely if dc is not done today, focus on gait endurance with monitoring of sats and greater balance challenges.    Follow Up Recommendations Home health PT;Supervision for mobility/OOB    Equipment Recommendations  None recommended by PT    Recommendations for Other Services       Precautions / Restrictions Precautions Precautions: Fall Precaution Comments: monitor O2 sats with gait Restrictions Weight Bearing Restrictions: No      Mobility  Bed Mobility Overal bed mobility: Modified Independent             General bed mobility comments: using bed rail to stand  Transfers Overall transfer level: Needs assistance Equipment used: 4-wheeled walker;1 person hand held assist Transfers: Sit to/from Stand Sit to Stand: Min assist         General transfer comment: min to power up and then takes just a moment to gather his balance  Ambulation/Gait Ambulation/Gait assistance: Min guard Gait Distance (Feet): 120 Feet Assistive device: 4-wheeled walker;1 person hand held assist(O2 tank) Gait Pattern/deviations: Step-through pattern;Decreased stride length;Trunk flexed;Wide base  of support Gait velocity: reduced Gait velocity interpretation: <1.31 ft/sec, indicative of household ambulator General Gait Details: pt makes mild concessions of gait to increase stability but o2 sat unchanged with 6L O2  Stairs            Wheelchair Mobility    Modified Rankin (Stroke Patients Only)       Balance Overall balance assessment: Needs assistance Sitting-balance support: Feet supported Sitting balance-Leahy Scale: Good     Standing balance support: Bilateral upper extremity supported;During functional activity Standing balance-Leahy Scale: Fair Standing balance comment: less than fair dynamically                             Pertinent Vitals/Pain Pain Assessment: No/denies pain    Home Living Family/patient expects to be discharged to:: Private residence Living Arrangements: Spouse/significant other Available Help at Discharge: Family;Available 24 hours/day Type of Home: House Home Access: Stairs to enter     Home Layout: Two level Home Equipment: Environmental consultant - 4 wheels      Prior Function Level of Independence: Independent with assistive device(s)         Comments: has been able to manage O2 with wife to assist on household tasks     Hand Dominance   Dominant Hand: Right    Extremity/Trunk Assessment   Upper Extremity Assessment Upper Extremity Assessment: Overall WFL for tasks assessed    Lower Extremity Assessment Lower Extremity Assessment: Generalized weakness    Cervical / Trunk Assessment Cervical / Trunk Assessment:  Kyphotic  Communication   Communication: No difficulties  Cognition Arousal/Alertness: Awake/alert Behavior During Therapy: WFL for tasks assessed/performed Overall Cognitive Status: Within Functional Limits for tasks assessed                                        General Comments General comments (skin integrity, edema, etc.): Pt is in agreement as is wife that he would benefit from  PT home therapy and will recommend him to continue after that to cardiac rehab again    Exercises     Assessment/Plan    PT Assessment Patient needs continued PT services  PT Problem List Decreased strength;Decreased range of motion;Decreased activity tolerance;Decreased balance;Decreased mobility;Decreased coordination;Cardiopulmonary status limiting activity       PT Treatment Interventions DME instruction;Therapeutic activities;Therapeutic exercise;Functional mobility training;Gait training;Stair training;Balance training;Neuromuscular re-education;Patient/family education    PT Goals (Current goals can be found in the Care Plan section)  Acute Rehab PT Goals Patient Stated Goal: to get back to cardiac rehab soon PT Goal Formulation: With patient/family Time For Goal Achievement: 08/13/19 Potential to Achieve Goals: Good    Frequency Min 3X/week   Barriers to discharge   has tolerance for gait to manage with wife to support his physical assist needs    Co-evaluation               AM-PAC PT "6 Clicks" Mobility  Outcome Measure Help needed turning from your back to your side while in a flat bed without using bedrails?: None Help needed moving from lying on your back to sitting on the side of a flat bed without using bedrails?: A Little Help needed moving to and from a bed to a chair (including a wheelchair)?: A Little Help needed standing up from a chair using your arms (e.g., wheelchair or bedside chair)?: A Little Help needed to walk in hospital room?: A Little Help needed climbing 3-5 steps with a railing? : A Lot 6 Click Score: 18    End of Session Equipment Utilized During Treatment: Gait belt;Oxygen Activity Tolerance: Patient tolerated treatment well;Patient limited by fatigue Patient left: in bed;with call bell/phone within reach;with family/visitor present Nurse Communication: Mobility status PT Visit Diagnosis: Unsteadiness on feet (R26.81);Muscle weakness  (generalized) (M62.81);Difficulty in walking, not elsewhere classified (R26.2)    Time: [id number] PT Time Calculation (min) (ACUTE ONLY): 22 min   Charges:   PT Evaluation $PT Eval Moderate Complexity: 1 Mod         Ramond Dial 07/30/2019, 1:47 PM   Mee Hives, PT MS Acute Rehab Dept. Number: [place] and [place]

## 2019-07-30 NOTE — Progress Notes (Signed)
New Admission Note:  Arrival Method: By bed from ED around 2140 Mental Orientation: Alert and oriented Telemetry: Box 20, CCMD notified Assessment: Completed Skin: Completed, refer to flowsheets IV: Right wrist S.L. Pain: Denies Tubes: None Safety Measures: Safety Fall Prevention Plan was given, discussed and signed. Admission: Completed [facility] Orientation: Patient has been orientated to the room, unit and the staff. Family: None  Orders have been reviewed and implemented. Will continue to monitor the patient. Call light has been placed within reach and bed alarm has been activated. Patient was placed on a low bed.   Perry Mount, RN  Phone Number: [phone]

## 2019-07-30 NOTE — Care Management (Signed)
Patient will DC w [facility] for [facility] services. No other CM needs

## 2019-07-30 NOTE — Discharge Summary (Addendum)
Physician Discharge Summary  [name] [id number] DOB: [birth year] DOA: 07/29/2019  PCP: [name], MD  Admit date: 07/29/2019 Discharge date: 07/30/2019  Time spent: 35 minutes  Recommendations for Outpatient Follow-up:  1. PCP in 1 week, if tremors recur or persist recommend neurology referral to evaluate for movement disorder 2. Home health physical therapy   Discharge Diagnoses:  Tremors   Chronic systolic heart failure (HCC)   Hyperlipidemia   Acute on chronic kidney failure (HCC) COPD Chronic respiratory failure   Discharge Condition: Stable  Diet recommendation: Low-sodium heart healthy  Filed Weights   07/30/19 0910  Weight: 100.2 kg    History of present illness:  Corey Tyler is a very pleasant 78 y.o. Caucasian male with medical history significant of systolic congestive heart failure with ejection fraction of 25%, status post pacemaker/defibrillator, chronic hypoxic respiratory failure on 3 to 6 L of oxygen at home, COPD, hyperlipidemia, coronary artery disease, arthritis, paroxysmal A. fib brought by his wife to emergency department due to generalized weakness and tremors yesterday  high  Hospital Course:   Generalized tremors and weakness -Transient, resolved -Patient reports that he has intermittent tremors at baseline however he felt that this was slightly worse when he was trying to get out of bed yesterday morning and hence ended up coming to the ER to be evaluated -Due to his extensive list of medical problems he was admitted overnight for observation -Patient has been hemodynamically stable and his laboratory evaluation was unremarkable as well, he will be discharged home to resume all his regular medications and follow-up with his primary cardiologist in 1 to 2 weeks, physicaltherapy evaluation completed, home health PT was recommended and set up  He is chronically ill with severe CHF, EF of 25%, chronic hypoxic respiratory failure/COPD on 3 to 6 L  of oxygen at baseline all this is felt to be stable at this time  Discharge Exam: Vitals:   07/30/19 0552 07/30/19 0910  BP: (!) 101/56 (!) 100/56  Pulse: 61 63  Resp: 18 18  Temp: 98.2 F (36.8 C) 97.9 F (36.6 C)  SpO2: 90% 94%    General: AAOx3 Cardiovascular: S1S2/RRR Respiratory: CTAB  Discharge Instructions   Discharge Instructions    Diet - low sodium heart healthy   Complete by: As directed    Increase activity slowly   Complete by: As directed      Allergies as of 07/30/2019      Reactions   Xarelto [rivaroxaban] Other (See Comments)   Bleeding   Adhesive [tape] Hives, Itching, Rash   Atorvastatin Cough   Codeine Nausea Only   Isosorbide Nitrate Other (See Comments)   Made him feel bad   Latex Itching, Other (See Comments)   Rash, itching, burning   Levofloxacin Other (See Comments)   Tachycardia   Lisinopril Cough   Lorazepam Other (See Comments)   "felt bad"   Mexiletine Other (See Comments)   GI distress   Sertraline Other (See Comments)   Made him feel bad      Medication List    TAKE these medications   acetaminophen 500 MG tablet Commonly known as: TYLENOL Take 500 mg by mouth every 6 (six) hours as needed for headache (pain).   Advair Diskus 250-50 MCG/DOSE Aepb Generic drug: Fluticasone-Salmeterol Inhale 1 puff into the lungs 2 (two) times daily as needed (shortness of breath/wheezing).   albuterol 108 (90 Base) MCG/ACT inhaler Commonly known as: VENTOLIN HFA Inhale 2 puffs into the lungs  every 6 (six) hours as needed for wheezing or shortness of breath.   albuterol (2.5 MG/3ML) 0.083% nebulizer solution Commonly known as: PROVENTIL Take 2.5 mg by nebulization every 6 (six) hours as needed for wheezing or shortness of breath.   amiodarone 200 MG tablet Commonly known as: PACERONE Take 2 tablets (400 mg total) by mouth daily. Take 400mg  by mouth daily for 1 month, then resume 200mg  by mouth daily dose. What changed:   how much to  take  additional instructions   aspirin EC 325 MG tablet Take 325 mg by mouth once.   cholecalciferol 1000 units tablet Commonly known as: VITAMIN D Take 1,000 Units by mouth daily.   Eliquis 5 MG Tabs tablet Generic drug: apixaban TAKE 1 TABLET BY MOUTH TWICE A DAY What changed: how much to take   Entresto 49-51 MG Generic drug: sacubitril-valsartan Take 0.5 tablets by mouth 2 (two) times daily. What changed: Another medication with the same name was removed. Continue taking this medication, and follow the directions you see here.   finasteride 5 MG tablet Commonly known as: PROSCAR Take 5 mg by mouth daily.   Fish Oil 500 MG Caps Take 500 mg by mouth 2 (two) times daily.   Flomax 0.4 MG Caps capsule Generic drug: tamsulosin Take 0.4 mg by mouth at bedtime.   fluticasone 50 MCG/ACT nasal spray Commonly known as: FLONASE Place 1 spray into both nostrils daily as needed for allergies.   levothyroxine 125 MCG tablet Commonly known as: SYNTHROID Take 125 mcg by mouth daily.   loratadine 10 MG tablet Commonly known as: CLARITIN Take 10 mg by mouth daily.   LUBRICATING EYE DROPS OP Place 1 drop into both eyes daily as needed (dry eyes).   MAGNESIUM PO Take 1 tablet by mouth at bedtime.   Melatonin 3 MG Tabs Take 3 mg by mouth at bedtime.   METAMUCIL PO Take 1 Dose by mouth daily. Mix in liquid and drink   metoprolol tartrate 50 MG tablet Commonly known as: LOPRESSOR Take 0.5 tablets (25 mg total) by mouth 2 (two) times daily. What changed: Another medication with the same name was removed. Continue taking this medication, and follow the directions you see here.   multivitamin with minerals Tabs tablet Take 1 tablet by mouth daily.   nitroGLYCERIN 0.4 MG SL tablet Commonly known as: NITROSTAT Place 0.4 mg under the tongue every 5 (five) minutes as needed for chest pain.   OXYGEN Inhale 3-6 L into the lungs daily. 3 at rest, 4-6 with exertion    pantoprazole 40 MG tablet Commonly known as: PROTONIX Take 40 mg by mouth daily as needed (acid reflux/indigestion).   potassium chloride 10 MEQ tablet Commonly known as: K-DUR Take 10 mEq by mouth at bedtime.   promethazine 25 MG tablet Commonly known as: PHENERGAN Take 25 mg by mouth daily as needed for nausea or vomiting.   ranolazine 1000 MG SR tablet Commonly known as: RANEXA Take 1 tablet (1,000 mg total) by mouth 2 (two) times daily.   Spiriva Respimat 2.5 MCG/ACT Aers Generic drug: Tiotropium Bromide Monohydrate Inhale 2 puffs into the lungs every morning.   torsemide 20 MG tablet Commonly known as: DEMADEX Take 1 tablet by mouth once daily   vitamin B-12 1000 MCG tablet Commonly known as: CYANOCOBALAMIN Take 1,000 mcg by mouth daily.      Allergies  Allergen Reactions  . Xarelto [Rivaroxaban] Other (See Comments)    Bleeding  . Adhesive [Tape] Hives,  Itching and Rash  . Atorvastatin Cough  . Codeine Nausea Only  . Isosorbide Nitrate Other (See Comments)    Made him feel bad  . Latex Itching and Other (See Comments)    Rash, itching, burning  . Levofloxacin Other (See Comments)    Tachycardia  . Lisinopril Cough  . Lorazepam Other (See Comments)    "felt bad"  . Mexiletine Other (See Comments)    GI distress  . Sertraline Other (See Comments)    Made him feel bad   Follow-up Information    [name], MD. Schedule an appointment as soon as possible for a visit in 1 week(s).   Specialty: Internal Medicine Contact information: [address] [place] [address] [phone]            The results of significant diagnostics from this hospitalization (including imaging, microbiology, ancillary and laboratory) are listed below for reference.    Significant Diagnostic Studies: Dg Chest 2 View  Result Date: 07/29/2019 CLINICAL DATA:  Increased weakness, tremors, vomiting, history COPD, atrial fibrillation, hypertension, coronary  artery disease post MI, CHF, non ischemic cardiomyopathy EXAM: CHEST - 2 VIEW COMPARISON:  04/13/2019, 04/21/2018 FINDINGS: LEFT subclavian AICD with leads projecting at RIGHT atrium and RIGHT ventricle. Normal heart size, mediastinal contours, and pulmonary vascularity. Atherosclerotic calcification aorta. Bibasilar atelectasis and chronic accentuation of interstitial markings greater on LEFT, appearance unchanged from prior exams. No definite acute infiltrate, pleural effusion or pneumothorax. Bones demineralized. IMPRESSION: Chronic bibasilar changes greater on LEFT. No acute abnormalities. Electronically Signed   By: Lavonia Dana M.D.   On: 07/29/2019 14:45   Ct Head Wo Contrast  Result Date: 07/29/2019 CLINICAL DATA:  Weakness.  Worsening tremors. EXAM: CT HEAD WITHOUT CONTRAST TECHNIQUE: Contiguous axial images were obtained from the base of the skull through the vertex without intravenous contrast. COMPARISON:  10/11/2012; 02/29/2008 FINDINGS: Brain: Similar findings of mild atrophy and periventricular hypodensities compatible microvascular ischemic disease. Gray-white differentiation is otherwise well maintained. No CT evidence of acute large territory infarct. No intraparenchymal or extra-axial mass or hemorrhage. Unchanged size and configuration of the ventricles and the basilar cisterns. No midline shift. Vascular: Intracranial atherosclerosis. Skull: No displaced calvarial fracture. Sinuses/Orbits: Limited visualization of the paranasal sinuses and the mastoid air cells is normal. No air-fluid levels. Post bilateral cataract surgery. Other: Regional soft tissues appear normal. IMPRESSION: Similar findings of mild atrophy and microvascular ischemic disease without superimposed acute intracranial process. Electronically Signed   By: Sandi Mariscal M.D.   On: 07/29/2019 14:25   US Renal  Result Date: 07/29/2019 CLINICAL DATA:  78 year old male with acute renal insufficiency. EXAM: RENAL / URINARY TRACT  ULTRASOUND COMPLETE COMPARISON:  CT of the abdomen pelvis dated 01/26/2018 FINDINGS: Right Kidney: Renal measurements: 9.7 x 4.7 x 3.9 cm = volume: 94.3 mL. There is mild parenchyma atrophy. Normal echogenicity. No hydronephrosis or shadowing stone. Left Kidney: Renal measurements: 9.4 x 5.4 x 3.9 cm = volume: 103 mL. Mild parenchyma atrophy and cortical thinning. No hydronephrosis or shadowing stone. Normal echogenicity. Bladder: Appears normal for degree of bladder distention. IMPRESSION: Mild bilateral parenchyma atrophy. No hydronephrosis or shadowing stone. Electronically Signed   By: Anner Crete M.D.   On: 07/29/2019 18:29   US Abdomen Limited Ruq  Result Date: 07/29/2019 CLINICAL DATA:  78 year old male with elevated LFTs. EXAM: ULTRASOUND ABDOMEN LIMITED RIGHT UPPER QUADRANT COMPARISON:  CT of the abdomen pelvis dated 01/26/2018 FINDINGS: Gallbladder: No gallstones or wall thickening visualized. No sonographic Murphy sign  noted by sonographer. Common bile duct: Diameter: 6 mm Liver: There is diffuse increased liver echogenicity most commonly seen in the setting fatty infiltration. Superimposed inflammation or fibrosis is not excluded. Clinical correlation is recommended. Portal vein is patent on color Doppler imaging with normal direction of blood flow towards the liver. Other: None. IMPRESSION: 1. No gallstone. 2. Fatty liver. Electronically Signed   By: Anner Crete M.D.   On: 07/29/2019 18:35    Microbiology: Recent Results (from the past 240 hour(s))  SARS Coronavirus 2 [facility] order, Performed in [facility] hospital lab) Nasopharyngeal Nasopharyngeal Swab     Status: None   Collection Time: 07/29/19  3:14 PM   Specimen: Nasopharyngeal Swab  Result Value Ref Range Status   SARS Coronavirus 2 NEGATIVE NEGATIVE Final    Comment: (NOTE) If result is NEGATIVE SARS-CoV-2 target nucleic acids are NOT DETECTED. The SARS-CoV-2 RNA is generally detectable in upper and lower   respiratory specimens during the acute phase of infection. The lowest  concentration of SARS-CoV-2 viral copies this assay can detect is 250  copies / mL. A negative result does not preclude SARS-CoV-2 infection  and should not be used as the sole basis for treatment or other  patient management decisions.  A negative result may occur with  improper specimen collection / handling, submission of specimen other  than nasopharyngeal swab, presence of viral mutation(s) within the  areas targeted by this assay, and inadequate number of viral copies  (<250 copies / mL). A negative result must be combined with clinical  observations, patient history, and epidemiological information. If result is POSITIVE SARS-CoV-2 target nucleic acids are DETECTED. The SARS-CoV-2 RNA is generally detectable in upper and lower  respiratory specimens dur ing the acute phase of infection.  Positive  results are indicative of active infection with SARS-CoV-2.  Clinical  correlation with patient history and other diagnostic information is  necessary to determine patient infection status.  Positive results do  not rule out bacterial infection or co-infection with other viruses. If result is PRESUMPTIVE POSTIVE SARS-CoV-2 nucleic acids MAY BE PRESENT.   A presumptive positive result was obtained on the submitted specimen  and confirmed on repeat testing.  While 2019 novel coronavirus  (SARS-CoV-2) nucleic acids may be present in the submitted sample  additional confirmatory testing may be necessary for epidemiological  and / or clinical management purposes  to differentiate between  SARS-CoV-2 and other Sarbecovirus currently known to infect humans.  If clinically indicated additional testing with an alternate test  methodology [phone]) is advised. The SARS-CoV-2 RNA is generally  detectable in upper and lower respiratory sp ecimens during the acute  phase of infection. The expected result is Negative. Fact  Sheet for Patients:  StrictlyIdeas.no Fact Sheet for Healthcare Providers: BankingDealers.co.za This test is not yet approved or cleared by the [place] FDA and has been authorized for detection and/or diagnosis of SARS-CoV-2 by FDA under an Emergency Use Authorization (EUA).  This EUA will remain in effect (meaning this test can be used) for the duration of the COVID-19 declaration under Section 564(b)(1) of the Act, 21 U.S.C. section 360bbb-3(b)(1), unless the authorization is terminated or revoked sooner. Performed at [facility], [place] [address] [place], [place] [address]   Urine culture     Status: Abnormal   Collection Time: 07/29/19  3:40 PM   Specimen: Urine, Clean Catch  Result Value Ref Range Status   Specimen Description URINE, CLEAN CATCH  Final  Special Requests NONE  Final   Culture (A)  Final    <10,000 COLONIES/mL INSIGNIFICANT GROWTH Performed at [facility], [place] [address], [place], [place] [address]    Report Status 07/30/2019 FINAL  Final     Labs: Basic Metabolic Panel: Recent Labs  Lab 07/29/19 1334 07/30/19 0457  NA 130* 134*  K 5.3* 3.5  CL 95* 98  CO2 26 28  GLUCOSE 121* 92  BUN 20 19  CREATININE 1.82* 1.67*  CALCIUM 8.4* 8.4*  MG 2.1  --   PHOS 3.3  --    Liver Function Tests: Recent Labs  Lab 07/29/19 1334 07/30/19 0457  AST 63* 17  ALT 15 19  ALKPHOS 47 47  BILITOT 1.7* 0.5  PROT 5.3* 5.0*  ALBUMIN 3.1* 2.8*   Recent Labs  Lab 07/29/19 1334  LIPASE 17   Recent Labs  Lab 07/29/19 1611  AMMONIA 44*   CBC: Recent Labs  Lab 07/29/19 1334 07/30/19 0457  WBC 6.8 6.9  NEUTROABS 4.9  --   HGB 12.4* 11.6*  HCT 36.1* 33.8*  MCV 97.3 97.7  PLT 198 167   Cardiac Enzymes: No results for input(s): CKTOTAL, CKMB, CKMBINDEX, TROPONINI in the last 168 hours. BNP: BNP (last 3 results) Recent Labs    04/13/19 1506 07/29/19 1514  BNP 111.1* 66.6    ProBNP  (last 3 results) No results for input(s): PROBNP in the last 8760 hours.  CBG: Recent Labs  Lab 07/30/19 0701  GLUCAP 89       Signed:  Domenic Polite MD.  [facility] 07/30/2019, 12:09 PM

## 2019-08-01 LAB — VITAMIN D 25 HYDROXY (VIT D DEFICIENCY, FRACTURES): Vit D, 25-Hydroxy: 26.1 ng/mL — ABNORMAL LOW (ref 30.0–100.0)

## 2019-08-02 DIAGNOSIS — M1990 Unspecified osteoarthritis, unspecified site: Secondary | ICD-10-CM | POA: Diagnosis not present

## 2019-08-02 DIAGNOSIS — I130 Hypertensive heart and chronic kidney disease with heart failure and stage 1 through stage 4 chronic kidney disease, or unspecified chronic kidney disease: Secondary | ICD-10-CM | POA: Diagnosis not present

## 2019-08-02 DIAGNOSIS — Z9981 Dependence on supplemental oxygen: Secondary | ICD-10-CM | POA: Diagnosis not present

## 2019-08-02 DIAGNOSIS — R918 Other nonspecific abnormal finding of lung field: Secondary | ICD-10-CM | POA: Diagnosis not present

## 2019-08-02 DIAGNOSIS — J9611 Chronic respiratory failure with hypoxia: Secondary | ICD-10-CM | POA: Diagnosis not present

## 2019-08-02 DIAGNOSIS — I5022 Chronic systolic (congestive) heart failure: Secondary | ICD-10-CM | POA: Diagnosis not present

## 2019-08-02 DIAGNOSIS — J449 Chronic obstructive pulmonary disease, unspecified: Secondary | ICD-10-CM | POA: Diagnosis not present

## 2019-08-02 DIAGNOSIS — E039 Hypothyroidism, unspecified: Secondary | ICD-10-CM | POA: Diagnosis not present

## 2019-08-02 DIAGNOSIS — R251 Tremor, unspecified: Secondary | ICD-10-CM | POA: Diagnosis not present

## 2019-08-02 DIAGNOSIS — I2510 Atherosclerotic heart disease of native coronary artery without angina pectoris: Secondary | ICD-10-CM | POA: Diagnosis not present

## 2019-08-02 DIAGNOSIS — N189 Chronic kidney disease, unspecified: Secondary | ICD-10-CM | POA: Diagnosis not present

## 2019-08-02 DIAGNOSIS — E785 Hyperlipidemia, unspecified: Secondary | ICD-10-CM | POA: Diagnosis not present

## 2019-08-02 DIAGNOSIS — I480 Paroxysmal atrial fibrillation: Secondary | ICD-10-CM | POA: Diagnosis not present

## 2019-08-04 ENCOUNTER — Encounter: Payer: Self-pay | Admitting: Cardiology

## 2019-08-04 DIAGNOSIS — E785 Hyperlipidemia, unspecified: Secondary | ICD-10-CM | POA: Diagnosis not present

## 2019-08-04 DIAGNOSIS — R200 Anesthesia of skin: Secondary | ICD-10-CM | POA: Diagnosis not present

## 2019-08-10 DIAGNOSIS — E039 Hypothyroidism, unspecified: Secondary | ICD-10-CM | POA: Diagnosis not present

## 2019-08-10 DIAGNOSIS — N189 Chronic kidney disease, unspecified: Secondary | ICD-10-CM | POA: Diagnosis not present

## 2019-08-10 DIAGNOSIS — R251 Tremor, unspecified: Secondary | ICD-10-CM | POA: Diagnosis not present

## 2019-08-10 DIAGNOSIS — M1990 Unspecified osteoarthritis, unspecified site: Secondary | ICD-10-CM | POA: Diagnosis not present

## 2019-08-10 DIAGNOSIS — R918 Other nonspecific abnormal finding of lung field: Secondary | ICD-10-CM | POA: Diagnosis not present

## 2019-08-10 DIAGNOSIS — Z9981 Dependence on supplemental oxygen: Secondary | ICD-10-CM | POA: Diagnosis not present

## 2019-08-10 DIAGNOSIS — E785 Hyperlipidemia, unspecified: Secondary | ICD-10-CM | POA: Diagnosis not present

## 2019-08-10 DIAGNOSIS — I5022 Chronic systolic (congestive) heart failure: Secondary | ICD-10-CM | POA: Diagnosis not present

## 2019-08-10 DIAGNOSIS — J9611 Chronic respiratory failure with hypoxia: Secondary | ICD-10-CM | POA: Diagnosis not present

## 2019-08-10 DIAGNOSIS — J449 Chronic obstructive pulmonary disease, unspecified: Secondary | ICD-10-CM | POA: Diagnosis not present

## 2019-08-10 DIAGNOSIS — I480 Paroxysmal atrial fibrillation: Secondary | ICD-10-CM | POA: Diagnosis not present

## 2019-08-10 DIAGNOSIS — I130 Hypertensive heart and chronic kidney disease with heart failure and stage 1 through stage 4 chronic kidney disease, or unspecified chronic kidney disease: Secondary | ICD-10-CM | POA: Diagnosis not present

## 2019-08-10 DIAGNOSIS — I2510 Atherosclerotic heart disease of native coronary artery without angina pectoris: Secondary | ICD-10-CM | POA: Diagnosis not present

## 2019-08-11 DIAGNOSIS — Z23 Encounter for immunization: Secondary | ICD-10-CM | POA: Diagnosis not present

## 2019-08-11 DIAGNOSIS — E559 Vitamin D deficiency, unspecified: Secondary | ICD-10-CM | POA: Diagnosis not present

## 2019-08-11 DIAGNOSIS — E039 Hypothyroidism, unspecified: Secondary | ICD-10-CM | POA: Diagnosis not present

## 2019-08-11 DIAGNOSIS — I10 Essential (primary) hypertension: Secondary | ICD-10-CM | POA: Diagnosis not present

## 2019-08-11 DIAGNOSIS — I5022 Chronic systolic (congestive) heart failure: Secondary | ICD-10-CM | POA: Diagnosis not present

## 2019-08-11 DIAGNOSIS — R0600 Dyspnea, unspecified: Secondary | ICD-10-CM | POA: Diagnosis not present

## 2019-08-11 DIAGNOSIS — I480 Paroxysmal atrial fibrillation: Secondary | ICD-10-CM | POA: Diagnosis not present

## 2019-08-11 DIAGNOSIS — J449 Chronic obstructive pulmonary disease, unspecified: Secondary | ICD-10-CM | POA: Diagnosis not present

## 2019-08-11 DIAGNOSIS — R251 Tremor, unspecified: Secondary | ICD-10-CM | POA: Diagnosis not present

## 2019-08-15 ENCOUNTER — Encounter: Payer: Self-pay | Admitting: Cardiology

## 2019-08-15 ENCOUNTER — Other Ambulatory Visit: Payer: Self-pay

## 2019-08-15 ENCOUNTER — Ambulatory Visit (INDEPENDENT_AMBULATORY_CARE_PROVIDER_SITE_OTHER): Payer: PPO | Admitting: Cardiology

## 2019-08-15 VITALS — BP 84/50 | HR 70 | Ht 73.0 in | Wt 223.8 lb

## 2019-08-15 DIAGNOSIS — I714 Abdominal aortic aneurysm, without rupture, unspecified: Secondary | ICD-10-CM

## 2019-08-15 DIAGNOSIS — I255 Ischemic cardiomyopathy: Secondary | ICD-10-CM | POA: Diagnosis not present

## 2019-08-15 MED ORDER — ENTRESTO 24-26 MG PO TABS: 0.5000 | ORAL_TABLET | Freq: Two times a day (BID) | ORAL | 6 refills | Status: DC

## 2019-08-15 NOTE — Patient Instructions (Signed)
Medication Instructions:  Your physician recommends that you continue on your current medications as directed. Please refer to the Current Medication list given to you today.  If you need a refill on your cardiac medications before your next appointment, please call your pharmacy.   Lab work: NONE  Testing/Procedures: Your physician has requested that you have an echocardiogram. Echocardiography is a painless test that uses sound waves to create images of your heart. It provides your doctor with information about the size and shape of your heart and how well your heart's chambers and valves are working. This procedure takes approximately one hour. There are no restrictions for this procedure. [place] has requested that you have an abdominal aorta duplex. During this test, an ultrasound is used to evaluate the aorta. Allow 30 minutes for this exam. Do not eat after midnight the day before and avoid carbonated beverages   Follow-Up: At [facility], you and your health needs are our priority.  As part of our continuing mission to provide you with exceptional heart care, we have created designated Provider Care Teams.  These Care Teams include your primary Cardiologist (physician) and Advanced Practice Providers (APPs -  Physician Assistants and Nurse Practitioners) who all work together to provide you with the care you need, when you need it. You will need a follow up appointment in 3 months.  Please call our office 2 months in advance to schedule this appointment.  You may see Dr Percival Spanish or one of the following Advanced Practice Providers on your designated Care Team:   Rosaria Ferries, PA-C Jory Sims, DNP, ANP  Any Other Special Instructions Will Be Listed Below (If Applicable).             Echocardiogram An echocardiogram is a procedure that uses painless sound waves (ultrasound) to produce an image of the heart. Images from an  echocardiogram can provide important information about:  Signs of coronary artery disease (CAD).  Aneurysm detection. An aneurysm is a weak or damaged part of an artery wall that bulges out from the normal force of blood pumping through the body.  Heart size and shape. Changes in the size or shape of the heart can be associated with certain conditions, including heart failure, aneurysm, and CAD.  Heart muscle function.  Heart valve function.  Signs of a past heart attack.  Fluid buildup around the heart.  Thickening of the heart muscle.  A tumor or infectious growth around the heart valves. Tell a health care provider about:  Any allergies you have.  All medicines you are taking, including vitamins, herbs, eye drops, creams, and over-the-counter medicines.  Any blood disorders you have.  Any surgeries you have had.  Any medical conditions you have.  Whether you are pregnant or may be pregnant. What are the risks? Generally, this is a safe procedure. However, problems may occur, including:  Allergic reaction to dye (contrast) that may be used during the procedure. What happens before the procedure? No specific preparation is needed. You may eat and drink normally. What happens during the procedure?   An IV tube may be inserted into one of your veins.  You may receive contrast through this tube. A contrast is an injection that improves the quality of the pictures from your heart.  A gel will be applied to your chest.  A wand-like tool (transducer) will be moved over your chest. The gel will help to transmit the sound waves from  the transducer.  The sound waves will harmlessly bounce off of your heart to allow the heart images to be captured in real-time motion. The images will be recorded on a computer. The procedure may vary among health care providers and hospitals. What happens after the procedure?  You may return to your normal, everyday life, including diet,  activities, and medicines, unless your health care provider tells you not to do that. Summary  An echocardiogram is a procedure that uses painless sound waves (ultrasound) to produce an image of the heart.  Images from an echocardiogram can provide important information about the size and shape of your heart, heart muscle function, heart valve function, and fluid buildup around your heart.  You do not need to do anything to prepare before this procedure. You may eat and drink normally.  After the echocardiogram is completed, you may return to your normal, everyday life, unless your health care provider tells you not to do that. This information is not intended to replace advice given to you by your health care provider. Make sure you discuss any questions you have with your health care provider. Document Released: 11/06/2000 Document Revised: 03/02/2019 Document Reviewed: 12/12/2016 Elsevier Patient Education  [place].    Abdominal Aortic Aneurysm  An aneurysm is a bulge in one of the blood vessels that carry blood away from the heart (artery). It happens when blood pushes up against a weak or damaged place in the wall of an artery. An abdominal aortic aneurysm happens in the main artery of the body (aorta). Some aneurysms may not cause problems. If it grows, it can burst or tear, causing bleeding inside the body. This is an emergency. It needs to be treated right away. What are the causes? The exact cause of this condition is not known. What increases the risk? The following may make you more likely to get this condition:  Being a male who is 37 years of age or older.  Being white (Caucasian).  Using tobacco.  Having a family history of aneurysms.  Having the following conditions: ? Hardening of the arteries (arteriosclerosis). ? Inflammation of the walls of an artery (arteritis). ? Certain genetic conditions. ? Being very overweight (obesity). ? An infection in the  wall of the aorta (infectious aortitis). ? High cholesterol. ? High blood pressure (hypertension). What are the signs or symptoms? Symptoms depend on the size of the aneurysm and how fast it is growing. Most grow slowly and do not cause any symptoms. If symptoms do occur, they may include:  Pain in the belly (abdomen), side, or back.  Feeling full after eating only small amounts of food.  Feeling a throbbing lump in the belly. Symptoms that the aneurysm has burst (ruptured) include:  Sudden, very bad pain in the belly, side, or back.  Feeling sick to your stomach (nauseous).  Throwing up (vomiting).  Feeling light-headed or passing out. How is this treated? Treatment for this condition depends on:  The size of the aneurysm.  How fast it is growing.  Your age.  Your risk of having it burst. If your aneurysm is smaller than 2 inches (5 cm), your doctor may manage it by:  Checking it often to see if it is getting bigger. You may have an imaging test (ultrasound) to check it every 3-6 months, every year, or every few years.  Giving you medicines to: ? Control blood pressure. ? Treat pain. ? Fight infection. If your aneurysm is larger than 2  inches (5 cm), you may need surgery to fix it. Follow these instructions at home: Lifestyle  Do not use any products that have nicotine or tobacco in them. This includes cigarettes, e-cigarettes, and chewing tobacco. If you need help quitting, ask your doctor.  Get regular exercise. Ask your doctor what types of exercise are best for you. Eating and drinking  Eat a heart-healthy diet. This includes eating plenty of: ? Fresh fruits and vegetables. ? Whole grains. ? Low-fat (lean) protein. ? Low-fat dairy products.  Avoid foods that are high in saturated fat and cholesterol. These foods include red meat and some dairy products.  Do not drink alcohol if: ? Your doctor tells you not to drink. ? You are pregnant, may be pregnant, or  are planning to become pregnant.  If you drink alcohol: ? Limit how much you use to:  0-1 drink a day for women.  0-2 drinks a day for men. ? Be aware of how much alcohol is in your drink. In the [place] one drink equals any of these:  One typical bottle of beer (12 oz).  One-half glass of wine (5 oz).  One shot of hard liquor (1 oz). General instructions  Take over-the-counter and prescription medicines only as told by your doctor.  Keep your blood pressure within normal limits. Ask your doctor what your blood pressure should be.  Have your blood sugar (glucose) level and cholesterol levels checked regularly. Keep your blood sugar level and cholesterol levels within normal limits.  Avoid heavy lifting and activities that take a lot of effort. Ask your doctor what activities are safe for you.  Keep all follow-up visits as told by your doctor. This is important. ? Talk to your doctor about regular screenings to see if the aneurysm is getting bigger. Contact a doctor if you:  Have pain in your belly, side, or back.  Have a throbbing feeling in your belly.  Have a family history of aneurysms. Get help right away if you:  Have sudden, bad pain in your belly, side, or back.  Feel sick to your stomach.  Throw up.  Have trouble pooping (constipation).  Have trouble peeing (urinating).  Feel light-headed.  Have a fast heart rate when you stand.  Have sweaty skin that is cold to the touch (clammy).  Have shortness of breath.  Have a fever. These symptoms may be an emergency. Do not wait to see if the symptoms will go away. Get medical help right away. Call your local emergency services (911 in the [place] Do not drive yourself to the hospital. Summary  An aneurysm is a bulge in one of the blood vessels that carry blood away from the heart (artery). Some aneurysms may not cause problems.  You may need to have yours checked often. If it grows, it can burst or tear. This  causes bleeding inside the body. It needs to be treated right away.  Follow instructions from your doctor about healthy lifestyle changes.  Keep all follow-up visits as told by your doctor. This is important. This information is not intended to replace advice given to you by your health care provider. Make sure you discuss any questions you have with your health care provider. Document Released: 03/06/2013 Document Revised: 02/27/2019 Document Reviewed: 06/18/2018 Elsevier Patient Education  2020 [employer].

## 2019-08-15 NOTE — Progress Notes (Signed)
Cardiology Office Note   Date:  08/16/2019   ID:  Delone, Heckle 1941/03/18, MRN [id number]  PCP:  [name], MD  Cardiologist:   No primary care provider on file.   Chief Complaint  Patient presents with  . Fatigue      History of Present Illness: Corey Tyler is a 78 y.o. male who presents for follow up of atrial fib and chronic systolic HF.      At a telephone visit with Dr. [name] in May he was acutely SOB.  I have reviewed these records for this visit. He was referred to the ED and was treated for a probable COPD flair. I reviewed these records for this visit.    His trop was negative and BNP was only mildly increased.      He was in the hospital earlier this month with tremor.  I reviewed these records as well for this appt. he is going to see neurology because of this.  However, his biggest complaint has been fatigue.  He is very weak.  His blood pressure has been running low.  He has not had any frank syncope or presyncope.  He has his chronic dyspnea.  He is not describing any new shortness of breath, PND or orthopnea.  He has had no new weight gain or edema.   Past Medical History:  Diagnosis Date  . A-fib ([place])    a. Noted on 12/2012 interrogation, placed on Apixaban and ultimately stopped NOACs 2/2 personal decision 8/14.  Marland Kitchen AICD (automatic cardioverter/defibrillator) present    Cardiac defibrillator -dual  St Judes  . Arthritis   . Atrial tachycardia-non sustained    a. Noted on 03/2012 interrogation.  . Chronic systolic heart failure ([facility])    EF down to 20 to 25% per echo November 2012  . COPD (chronic obstructive pulmonary disease) ([place])   . Hyperlipidemia   . Hypertension   . ICD (implantable cardiac defibrillator) in place   . NICM (nonischemic cardiomyopathy) ([place])    a. Normal cors 2000. b. Minimal plaque 2013.  . Old myocardial infarction    a. ?Silent MI. b. Large fixed inferolateral defect c/w with prior infarct, no ischemia. c. Cath in  2000/2013 with only minimal CAD.  Marland Kitchen Presence of permanent cardiac pacemaker   . Pulmonary nodule, right    Last scan in 2010 showing stability; felt to be benign.  Marland Kitchen PVC's (premature ventricular contractions)   . Ventricular tachycardia, polymorphic ([place])    Rx via ICD 12/14    Past Surgical History:  Procedure Laterality Date  . APPENDECTOMY    . CARDIAC CATHETERIZATION  2000  . CARDIAC DEFIBRILLATOR PLACEMENT    . CARDIOVERSION  03/30/2013   Procedure: CARDIOVERSION;  Surgeon: Thayer Headings, MD;  Location: [facility];  Service: Cardiovascular;;  . COLON SURGERY    . COLONOSCOPY N/A 09/25/2013   Procedure: COLONOSCOPY;  Surgeon: Jerene Bears, MD;  Location: [facility];  Service: Endoscopy;  Laterality: N/A;  . ICD  12/2011   [name]  . IMPLANTABLE CARDIOVERTER DEFIBRILLATOR IMPLANT N/A 01/06/2012   Procedure: IMPLANTABLE CARDIOVERTER DEFIBRILLATOR IMPLANT;  Surgeon: Deboraha Sprang, MD;  [facility];  Service: Cardiovascular;  Laterality: N/A;  . PACEMAKER IMPLANT     St [name]  . TEE WITHOUT CARDIOVERSION N/A 03/30/2013   Procedure: TRANSESOPHAGEAL ECHOCARDIOGRAM (TEE);  Surgeon: Thayer Headings, MD;  Location: [place];  Service: Cardiovascular;  Laterality: N/A;  Clayton Bibles TACH ABLATION  06/10/2017  . V TACH ABLATION N/A 06/10/2017   Procedure: V Tach Ablation;  Surgeon: Evans Lance, MD;  Location: [place] CV LAB;  Service: Cardiovascular;  Laterality: N/A;     Current Outpatient Medications  Medication Sig Dispense Refill  . acetaminophen (TYLENOL) 500 MG tablet Take 500 mg by mouth every 6 (six) hours as needed for headache (pain).    Marland Kitchen albuterol (PROVENTIL) (2.5 MG/3ML) 0.083% nebulizer solution Take 2.5 mg by nebulization every 6 (six) hours as needed for wheezing or shortness of breath.    Marland Kitchen albuterol (VENTOLIN HFA) 108 (90 Base) MCG/ACT inhaler Inhale 2 puffs into the lungs every 6 (six) hours as needed for wheezing or shortness of breath.    Marland Kitchen amiodarone  (PACERONE) 200 MG tablet Take 2 tablets (400 mg total) by mouth daily. Take 400mg  by mouth daily for 1 month, then resume 200mg  by mouth daily dose. (Patient taking differently: Take 200 mg by mouth daily. ) 60 tablet 0  . Carboxymethylcellul-Glycerin (LUBRICATING EYE DROPS OP) Place 1 drop into both eyes daily as needed (dry eyes).     . cholecalciferol (VITAMIN D) 1000 UNITS tablet Take 1,000 Units by mouth daily.    Marland Kitchen ELIQUIS 5 MG TABS tablet TAKE 1 TABLET BY MOUTH TWICE A DAY (Patient taking differently: Take 5 mg by mouth 2 (two) times daily. ) 60 tablet 5  . finasteride (PROSCAR) 5 MG tablet Take 5 mg by mouth daily.     . fluticasone (FLONASE) 50 MCG/ACT nasal spray Place 1 spray into both nostrils daily as needed for allergies.     . Fluticasone-Salmeterol (ADVAIR DISKUS) 250-50 MCG/DOSE AEPB Inhale 1 puff into the lungs 2 (two) times daily as needed (shortness of breath/wheezing).     Marland Kitchen levothyroxine (SYNTHROID, LEVOTHROID) 125 MCG tablet Take 125 mcg by mouth daily.    Marland Kitchen loratadine (CLARITIN) 10 MG tablet Take 10 mg by mouth daily.    Marland Kitchen MAGNESIUM PO Take 1 tablet by mouth at bedtime.    . Melatonin 3 MG TABS Take 3 mg by mouth at bedtime.    . metoprolol tartrate (LOPRESSOR) 50 MG tablet Take 0.5 tablets (25 mg total) by mouth 2 (two) times daily. 60 tablet 0  . Multiple Vitamin (MULTIVITAMIN WITH MINERALS) TABS tablet Take 1 tablet by mouth daily.    . nitroGLYCERIN (NITROSTAT) 0.4 MG SL tablet Place 0.4 mg under the tongue every 5 (five) minutes as needed for chest pain.     . Omega-3 Fatty Acids (FISH OIL) 500 MG CAPS Take 500 mg by mouth 2 (two) times daily.    . OXYGEN Inhale 3-6 L into the lungs daily. 3 at rest, 4-6 with exertion    . pantoprazole (PROTONIX) 40 MG tablet Take 40 mg by mouth daily as needed (acid reflux/indigestion).    . potassium chloride (K-DUR) 10 MEQ tablet Take 10 mEq by mouth at bedtime.     . promethazine (PHENERGAN) 25 MG tablet Take 25 mg by mouth daily as  needed for nausea or vomiting.     . Psyllium (METAMUCIL PO) Take 1 Dose by mouth daily. Mix in liquid and drink     . ranolazine (RANEXA) 1000 MG SR tablet Take 1 tablet (1,000 mg total) by mouth 2 (two) times daily. 180 tablet 3  . Tamsulosin HCl (FLOMAX) 0.4 MG CAPS Take 0.4 mg by mouth at bedtime.     . Tiotropium Bromide Monohydrate (SPIRIVA RESPIMAT) 2.5 MCG/ACT AERS Inhale 2 puffs into  the lungs every morning.    . torsemide (DEMADEX) 20 MG tablet Take 1 tablet by mouth once daily (Patient taking differently: Take 20 mg by mouth daily. ) 90 tablet 2  . sacubitril-valsartan (ENTRESTO) 24-26 MG Take 0.5 tablets by mouth 2 (two) times daily. 30 tablet 6   No current facility-administered medications for this visit.     Allergies:   Xarelto [rivaroxaban], Adhesive [tape], Atorvastatin, Codeine, Isosorbide nitrate, Latex, Levofloxacin, Lisinopril, Lorazepam, Mexiletine, and Sertraline    ROS:  Please see the history of present illness.   Otherwise, review of systems are positive for none.   All other systems are reviewed and negative.    PHYSICAL EXAM: VS:  BP (!) 84/50   Pulse 70   Ht 6\' 1"  (1.854 m)   Wt 223 lb 12.8 oz (101.5 kg)   BMI 29.53 kg/m  , BMI Body mass index is 29.53 kg/m. GEN:  No distress NECK:  No jugular venous distention at 90 degrees, waveform within normal limits, carotid upstroke brisk and symmetric, no bruits, no thyromegaly LYMPHATICS:  No cervical adenopathy LUNGS:  Clear to auscultation bilaterally BACK:  No CVA tenderness CHEST:  Unremarkable HEART:  S1 and S2 within normal limits, no S3, no S4, no clicks, no rubs, no murmurs, distant heart sounds ABD:  Positive bowel sounds normal in frequency in pitch, no bruits, no rebound, no guarding, unable to assess midline mass or bruit with the patient seated. EXT:  2 plus pulses throughout, moderate edema, no cyanosis no clubbing SKIN:  No rashes no nodules NEURO:  Cranial nerves II through XII grossly intact,  motor grossly intact throughout PSYCH:  Cognitively intact, oriented to person place and time   EKG:  EKG is not ordered today.  The ekg ordered today demonstrates  NSR, rate 67, axis within normal limits, interventricular conduction delay, no acute ST-T wave changes, 07/29/2019   Recent Labs: 07/29/2019: B Natriuretic Peptide 66.6; Magnesium 2.1; TSH 0.630 07/30/2019: ALT 19; BUN 19; Creatinine, Ser 1.67; Hemoglobin 11.6; Platelets 167; Potassium 3.5; Sodium 134     Lab Results  Component Value Date   TSH 0.630 07/29/2019   ALT 19 07/30/2019   AST 17 07/30/2019   ALKPHOS 47 07/30/2019   BILITOT 0.5 07/30/2019   PROT 5.0 (L) 07/30/2019   ALBUMIN 2.8 (L) 07/30/2019     Wt Readings from Last 3 Encounters:  08/15/19 223 lb 12.8 oz (101.5 kg)  07/30/19 220 lb 14.4 oz (100.2 kg)  04/13/19 221 lb (100.2 kg)      Other studies Reviewed: Additional studies/ records that were reviewed today include: Hospital records. Review of the above records demonstrates:  Please see elsewhere in the note.     ASSESSMENT AND PLAN:  Cardiomyopathy - The patient hasn't tolerated beta blocker titration or other med titration in the past.  Now his profound complaint is fatigue.  He has a tremor.  He does have a particular low blood pressure and something to reduce his Entresto to one half twice daily of the 24/26.  I will check another echocardiogram.  Otherwise labs have been unremarkable and he will proceed with his neurology work-up.  Atrial fibrillation - Corey Tyler has a CHA2DS2 - VASc score of 5 with a risk of stroke of 6.7%.  He is up to date with lab follow up.  We will continue the meds as listed.  VT - He had normal ICD follow up on the device check in June.  I  reviewed this.  He has follow up later this week with Dr. [name].  He does have follow-up pending.  HYPERTENSION -  This is being managed in the context of treating his CHF.  As stated above he is now hypotensive and this  will necessitate med titration downward.    CAD - Given his multiple comorbid illnesses we will follow him conservatively.    CKD III Creatinine is stable at 1.67.  AAA He mentions having it AAA in the past but I do not see any follow-up.  Therefore, he will have imaging of this as well.    Current medicines are reviewed at length with the patient today.  The patient does not have concerns regarding medicines.  The following changes have been made:  As above.   Labs/ tests ordered today include: None  Orders Placed This Encounter  Procedures  . ECHOCARDIOGRAM COMPLETE  . VAS [place] AAA DUPLEX     Disposition:   FU with me after the above studies in the office.     Signed, Minus Breeding, MD  08/16/2019 9:18 AM    [place]

## 2019-08-16 ENCOUNTER — Encounter: Payer: Self-pay | Admitting: Cardiology

## 2019-08-17 ENCOUNTER — Telehealth: Payer: Self-pay | Admitting: Cardiology

## 2019-08-17 ENCOUNTER — Telehealth: Payer: Self-pay | Admitting: Internal Medicine

## 2019-08-17 NOTE — Telephone Encounter (Signed)
Message not needed. °

## 2019-08-17 NOTE — Telephone Encounter (Signed)
[name] she states that this is pt first fill. I gave pharmacist entresto free 30 day card #'s and she states that she will fill and this rx will be free.  S/w melinda she states that she tried to take care of this. Tried to call pt and phone rang then hung up-melinda will call pt back.

## 2019-08-17 NOTE — Telephone Encounter (Signed)
Spoke with pharmacy and patient needs PA for [facility]  Would not let me start PA , pharmacy is to send PA request form

## 2019-08-17 NOTE — Telephone Encounter (Signed)
New Message  Pt c/o medication issue:  1. Name of Medication: Entresto  2. How are you currently taking this medication dosage and times per day)? As instructed by Dr. Percival Spanish at appointment on 08/15/19  3. Are you having a reaction (difficulty breathing--STAT)? No  4. What is your medication issue? Patient states that insurance is not trying to cover the medication due to patient just picking up a refill for the medication already. Please assist with sending a new prescription for the medication.

## 2019-08-18 ENCOUNTER — Other Ambulatory Visit: Payer: Self-pay

## 2019-08-18 ENCOUNTER — Ambulatory Visit (HOSPITAL_COMMUNITY): Payer: PPO | Attending: Cardiology

## 2019-08-18 DIAGNOSIS — I255 Ischemic cardiomyopathy: Secondary | ICD-10-CM

## 2019-08-22 ENCOUNTER — Ambulatory Visit (INDEPENDENT_AMBULATORY_CARE_PROVIDER_SITE_OTHER): Payer: No Typology Code available for payment source | Admitting: *Deleted

## 2019-08-22 DIAGNOSIS — I255 Ischemic cardiomyopathy: Secondary | ICD-10-CM

## 2019-08-22 LAB — CUP PACEART REMOTE DEVICE CHECK
Battery Remaining Longevity: 19 mo
Battery Remaining Percentage: 20 %
Battery Voltage: 2.77 V
Brady Statistic AP VP Percent: 1.1 %
Brady Statistic AP VS Percent: 66 %
Brady Statistic AS VP Percent: 1 %
Brady Statistic AS VS Percent: 32 %
Brady Statistic RA Percent Paced: 66 %
Brady Statistic RV Percent Paced: 1.1 %
Date Time Interrogation Session: 20200929073503
HighPow Impedance: 81 Ohm
HighPow Impedance: 81 Ohm
Implantable Lead Implant Date: 20130213
Implantable Lead Implant Date: 20130213
Implantable Lead Location: 753859
Implantable Lead Location: 753860
Implantable Pulse Generator Implant Date: 20130213
Lead Channel Impedance Value: 390 Ohm
Lead Channel Impedance Value: 430 Ohm
Lead Channel Pacing Threshold Amplitude: 0.5 V
Lead Channel Pacing Threshold Amplitude: 0.75 V
Lead Channel Pacing Threshold Pulse Width: 0.5 ms
Lead Channel Pacing Threshold Pulse Width: 0.6 ms
Lead Channel Sensing Intrinsic Amplitude: 12 mV
Lead Channel Sensing Intrinsic Amplitude: 2.1 mV
Lead Channel Setting Pacing Amplitude: 1.5 V
Lead Channel Setting Pacing Amplitude: 2.5 V
Lead Channel Setting Pacing Pulse Width: 0.6 ms
Lead Channel Setting Sensing Sensitivity: 0.5 mV
Pulse Gen Serial Number: 7003419

## 2019-08-23 ENCOUNTER — Ambulatory Visit (HOSPITAL_COMMUNITY)
Admission: RE | Admit: 2019-08-23 | Discharge: 2019-08-23 | Disposition: A | Discharge status: Home / Self Care | Payer: No Typology Code available for payment source | Source: Ambulatory Visit | Attending: Cardiovascular Disease | Admitting: Cardiovascular Disease

## 2019-08-23 ENCOUNTER — Other Ambulatory Visit: Payer: Self-pay

## 2019-08-23 DIAGNOSIS — I714 Abdominal aortic aneurysm, without rupture, unspecified: Secondary | ICD-10-CM

## 2019-08-24 ENCOUNTER — Telehealth: Payer: Self-pay

## 2019-08-24 DIAGNOSIS — I714 Abdominal aortic aneurysm, without rupture, unspecified: Secondary | ICD-10-CM

## 2019-08-24 NOTE — Telephone Encounter (Signed)
Will forward to DOD for review.

## 2019-08-24 NOTE — Telephone Encounter (Signed)
Corey Tyler currently being held

## 2019-08-24 NOTE — Telephone Encounter (Signed)
° ° °  [place] from [place] calling re: Corey Tyler pre Josem Kaufmann  Ref key :[facility] Phone  [phone]

## 2019-08-24 NOTE — Telephone Encounter (Signed)
-----   Message from Minus Breeding, MD sent at 08/23/2019  1:45 PM EDT ----- Mild AAA of 3.2 cm.  Repeat Doppler in 2 years. Call Mr. Litwiller with the results and send results to [name], MD

## 2019-08-24 NOTE — Telephone Encounter (Signed)
Agree with holding Entresto. Ok to hold metoprolol but I would resume tomorrow. He is on a very low dose. It may take 2-3 days for Entresto to wash out.  Angus Amini [place] MD, [facility]

## 2019-08-24 NOTE — Telephone Encounter (Signed)
Called pt about results and pt stated he was feeling weak and BP low. 98/51 with a HR 66. Asked pt about any other symptoms, denied. Asked pt about medication compliance, states he stopped taking entresto d/t feeling weak and causing low BP. Advised pt to hold night dose of metoprolol and that this message would be sent to Dr. Percival Spanish.

## 2019-08-24 NOTE — Telephone Encounter (Signed)
Advised wife of Dr Doug Sou recommendations, verbalized understanding.  Did advise wife of ultrasound results

## 2019-08-25 NOTE — Telephone Encounter (Signed)
Please move up his appt. I will have openings. He can hold the Entresto until then but continue the beta blocker.

## 2019-08-29 ENCOUNTER — Encounter: Payer: Self-pay | Admitting: Cardiology

## 2019-08-29 ENCOUNTER — Other Ambulatory Visit: Payer: Self-pay

## 2019-08-29 ENCOUNTER — Ambulatory Visit (INDEPENDENT_AMBULATORY_CARE_PROVIDER_SITE_OTHER): Payer: No Typology Code available for payment source | Admitting: Cardiology

## 2019-08-29 ENCOUNTER — Telehealth: Payer: Self-pay

## 2019-08-29 VITALS — BP 108/60 | HR 68 | Ht 73.0 in | Wt 227.8 lb

## 2019-08-29 DIAGNOSIS — I4729 Other ventricular tachycardia: Secondary | ICD-10-CM

## 2019-08-29 DIAGNOSIS — I472 Ventricular tachycardia: Secondary | ICD-10-CM

## 2019-08-29 DIAGNOSIS — I10 Essential (primary) hypertension: Secondary | ICD-10-CM | POA: Diagnosis not present

## 2019-08-29 DIAGNOSIS — I714 Abdominal aortic aneurysm, without rupture, unspecified: Secondary | ICD-10-CM

## 2019-08-29 DIAGNOSIS — I2510 Atherosclerotic heart disease of native coronary artery without angina pectoris: Secondary | ICD-10-CM

## 2019-08-29 DIAGNOSIS — I5022 Chronic systolic (congestive) heart failure: Secondary | ICD-10-CM

## 2019-08-29 DIAGNOSIS — N1832 Chronic kidney disease, stage 3b: Secondary | ICD-10-CM

## 2019-08-29 DIAGNOSIS — I482 Chronic atrial fibrillation, unspecified: Secondary | ICD-10-CM

## 2019-08-29 NOTE — Telephone Encounter (Signed)
Spoke to wife about moving appt. Moved appt to 08/29/19 @ 4:20pm. Verbalized understanding.

## 2019-08-29 NOTE — Patient Instructions (Addendum)
Medication Instructions:  Your physician recommends that you continue on your current medications as directed. Please refer to the Current Medication list given to you today.  If you need a refill on your cardiac medications before your next appointment, please call your pharmacy.   Lab work: NONE   Testing/Procedures: NONE  Follow-Up: December at 12/16 at 1:40 pm with Dr Percival Spanish

## 2019-08-29 NOTE — Progress Notes (Signed)
Cardiology Office Note   Date:  08/29/2019   ID:  Corey Tyler, Corey Tyler Jan 13, 1941, Corey Tyler  PCP:  [name], MD  Cardiologist:   No primary care provider on file.   Chief Complaint  Patient presents with  . Fatigue      History of Present Illness: Corey Tyler is a 78 y.o. male who presents for follow up of atrial fib and chronic systolic HF.    I saw him recently after a hospitalization.  He had tremor and was very weak.  He was having follow up with a neurologist.  I reduced his Entresto dose.  However, he called the other day with hypotension and had to hold  his Enteresto.   I did order a follow up echo and unfortunately, his EF was lower than previous at 25%.  EF in 2018 was 45 - 50%.    He was added to my schedule.  He said that since we reduced the beta-blocker he has had none of the acute weakness that he was having.  He is chronically.  He says his breathing has been at baseline.  The saturation falls into the low 80s when he is moving but he goes back up when he sits down.  He denies any acute nighttime shortness of breath.  He denies any palpitations, presyncope or syncope.  He has had no chest pressure, neck or arm discomfort.  He had only mild lower extremity swelling.  He does feel some palpitations.  For this appointment plan look at his device interrogation and he has less than 1% atrial fibrillation.  He had 1 antitachycardia pacing episode.   Past Medical History:  Diagnosis Date  . A-fib ([place])    a. Noted on 12/2012 interrogation, placed on Apixaban and ultimately stopped NOACs 2/2 personal decision 8/14.  Marland Kitchen AICD (automatic cardioverter/defibrillator) present    Cardiac defibrillator -dual  [employer]  . Arthritis   . Atrial tachycardia-non sustained    a. Noted on 03/2012 interrogation.  . Chronic systolic heart failure ([facility])    EF down to 20 to 25% per echo November 2012  . COPD (chronic obstructive pulmonary disease) ([place])   . Hyperlipidemia   .  Hypertension   . ICD (implantable cardiac defibrillator) in place   . NICM (nonischemic cardiomyopathy) ([place])    a. Normal cors 2000. b. Minimal plaque 2013.  . Old myocardial infarction    a. ?Silent MI. b. Large fixed inferolateral defect c/w with prior infarct, no ischemia. c. Cath in 2000/2013 with only minimal CAD.  Marland Kitchen Presence of permanent cardiac pacemaker   . Pulmonary nodule, right    Last scan in 2010 showing stability; felt to be benign.  Marland Kitchen PVC's (premature ventricular contractions)   . Ventricular tachycardia, polymorphic ([place])    Rx via ICD 12/14    Past Surgical History:  Procedure Laterality Date  . APPENDECTOMY    . CARDIAC CATHETERIZATION  2000  . CARDIAC DEFIBRILLATOR PLACEMENT    . CARDIOVERSION  03/30/2013   Procedure: CARDIOVERSION;  Surgeon: Thayer Headings, MD;  Location: [facility];  Service: Cardiovascular;;  . COLON SURGERY    . COLONOSCOPY N/A 09/25/2013   Procedure: COLONOSCOPY;  Surgeon: Jerene Bears, MD;  Location: [facility];  Service: Endoscopy;  Laterality: N/A;  . ICD  12/2011   [name]  . IMPLANTABLE CARDIOVERTER DEFIBRILLATOR IMPLANT N/A 01/06/2012   Procedure: IMPLANTABLE CARDIOVERTER DEFIBRILLATOR IMPLANT;  Surgeon: Deboraha Sprang, MD;  Location: [facility]  CATH LAB;  Service: Cardiovascular;  Laterality: N/A;  . PACEMAKER IMPLANT     [name]  . TEE WITHOUT CARDIOVERSION N/A 03/30/2013   Procedure: TRANSESOPHAGEAL ECHOCARDIOGRAM (TEE);  Surgeon: Thayer Headings, MD;  Location: [facility];  Service: Cardiovascular;  Laterality: N/A;  . V TACH ABLATION  06/10/2017  . V TACH ABLATION N/A 06/10/2017   Procedure: V Tach Ablation;  Surgeon: Evans Lance, MD;  Location: [facility] CV LAB;  Service: Cardiovascular;  Laterality: N/A;     Current Outpatient Medications  Medication Sig Dispense Refill  . acetaminophen (TYLENOL) 500 MG tablet Take 500 mg by mouth every 6 (six) hours as needed for headache (pain).    Marland Kitchen albuterol (PROVENTIL) (2.5 MG/3ML) 0.083%  nebulizer solution Take 2.5 mg by nebulization every 6 (six) hours as needed for wheezing or shortness of breath.    Marland Kitchen albuterol (VENTOLIN HFA) 108 (90 Base) MCG/ACT inhaler Inhale 2 puffs into the lungs every 6 (six) hours as needed for wheezing or shortness of breath.    Marland Kitchen amiodarone (PACERONE) 200 MG tablet Take 2 tablets (400 mg total) by mouth daily. Take 400mg  by mouth daily for 1 month, then resume 200mg  by mouth daily dose. (Patient taking differently: Take 200 mg by mouth daily. ) 60 tablet 0  . Carboxymethylcellul-Glycerin (LUBRICATING EYE DROPS OP) Place 1 drop into both eyes daily as needed (dry eyes).     . cholecalciferol (VITAMIN D) 1000 UNITS tablet Take 1,000 Units by mouth daily.    Marland Kitchen ELIQUIS 5 MG TABS tablet TAKE 1 TABLET BY MOUTH TWICE A DAY (Patient taking differently: Take 5 mg by mouth 2 (two) times daily. ) 60 tablet 5  . finasteride (PROSCAR) 5 MG tablet Take 5 mg by mouth daily.     . fluticasone (FLONASE) 50 MCG/ACT nasal spray Place 1 spray into both nostrils daily as needed for allergies.     . Fluticasone-Salmeterol (ADVAIR DISKUS) 250-50 MCG/DOSE AEPB Inhale 1 puff into the lungs 2 (two) times daily as needed (shortness of breath/wheezing).     Marland Kitchen levothyroxine (SYNTHROID, LEVOTHROID) 125 MCG tablet Take 125 mcg by mouth daily.    Marland Kitchen loratadine (CLARITIN) 10 MG tablet Take 10 mg by mouth daily.    Marland Kitchen MAGNESIUM PO Take 1 tablet by mouth at bedtime.    . Melatonin 3 MG TABS Take 3 mg by mouth at bedtime.    . metoprolol tartrate (LOPRESSOR) 50 MG tablet Take 0.5 tablets (25 mg total) by mouth 2 (two) times daily. 60 tablet 0  . Multiple Vitamin (MULTIVITAMIN WITH MINERALS) TABS tablet Take 1 tablet by mouth daily.    . nitroGLYCERIN (NITROSTAT) 0.4 MG SL tablet Place 0.4 mg under the tongue every 5 (five) minutes as needed for chest pain.     . Omega-3 Fatty Acids (FISH OIL) 500 MG CAPS Take 500 mg by mouth 2 (two) times daily.    . OXYGEN Inhale 3-6 L into the lungs daily.  3 at rest, 4-6 with exertion    . pantoprazole (PROTONIX) 40 MG tablet Take 40 mg by mouth daily as needed (acid reflux/indigestion).    . potassium chloride (K-DUR) 10 MEQ tablet Take 10 mEq by mouth at bedtime.     . promethazine (PHENERGAN) 25 MG tablet Take 25 mg by mouth daily as needed for nausea or vomiting.     . Psyllium (METAMUCIL PO) Take 1 Dose by mouth daily. Mix in liquid and drink     .  ranolazine (RANEXA) 1000 MG SR tablet Take 1 tablet (1,000 mg total) by mouth 2 (two) times daily. 180 tablet 3  . Tamsulosin HCl (FLOMAX) 0.4 MG CAPS Take 0.4 mg by mouth at bedtime.     . Tiotropium Bromide Monohydrate (SPIRIVA RESPIMAT) 2.5 MCG/ACT AERS Inhale 2 puffs into the lungs every morning.    . torsemide (DEMADEX) 20 MG tablet Take 1 tablet by mouth once daily (Patient taking differently: Take 20 mg by mouth daily. ) 90 tablet 2   No current facility-administered medications for this visit.     Allergies:   Xarelto [rivaroxaban], Adhesive [tape], Atorvastatin, Codeine, Isosorbide nitrate, Latex, Levofloxacin, Lisinopril, Lorazepam, Mexiletine, and Sertraline    ROS:  Please see the history of present illness.   Otherwise, review of systems are positive for none.   All other systems are reviewed and negative.    PHYSICAL EXAM: VS:  BP 108/60   Pulse 68   Ht 6\' 1"  (1.854 m)   Wt 227 lb 12.8 oz (103.3 kg)   BMI 30.05 kg/m  , BMI Body mass index is 30.05 kg/m. GEN:  No distress NECK:  No jugular venous distention at 90 degrees, waveform within normal limits, carotid upstroke brisk and symmetric, no bruits, no thyromegaly LYMPHATICS:  No cervical adenopathy LUNGS:  Clear to auscultation bilaterally BACK:  No CVA tenderness CHEST:  Unremarkable HEART:  S1 and S2 within normal limits, no S3, no S4, no clicks, no rubs, no murmurs, distant heart sounds ABD:  Positive bowel sounds normal in frequency in pitch, no bruits, no rebound, no guarding, unable to assess midline mass or bruit  with the patient seated. EXT:  2 plus pulses throughout, mild bilateral lower extremity edema, no cyanosis no clubbing SKIN:  No rashes no nodules NEURO:  Cranial nerves II through XII grossly intact, motor grossly intact throughout PSYCH:  Cognitively intact, oriented to person place and time   EKG:  EKG is not  ordered today.     Recent Labs: 07/29/2019: B Natriuretic Peptide 66.6; Magnesium 2.1; TSH 0.630 07/30/2019: ALT 19; BUN 19; Creatinine, Ser 1.67; Hemoglobin 11.6; Platelets 167; Potassium 3.5; Sodium 134     Lab Results  Component Value Date   TSH 0.630 07/29/2019   ALT 19 07/30/2019   AST 17 07/30/2019   ALKPHOS 47 07/30/2019   BILITOT 0.5 07/30/2019   PROT 5.0 (L) 07/30/2019   ALBUMIN 2.8 (L) 07/30/2019     Wt Readings from Last 3 Encounters:  08/29/19 227 lb 12.8 oz (103.3 kg)  08/15/19 223 lb 12.8 oz (101.5 kg)  07/30/19 220 lb 14.4 oz (100.2 kg)      Other studies Reviewed: Additional studies/ records that were reviewed today include: See below Review of the above records demonstrates:  Please see elsewhere in the note.     ASSESSMENT AND PLAN:  Cardiomyopathy - The patient's ejection fraction is reduced.  However, he does not tolerate med titration.  He can stay off the [facility] but I am convinced him to try to stay on the low dose of beta-blocker.  T  Atrial fibrillation - [name] has a CHA2DS2 - VASc score of 5 with a risk of stroke of 6.7%.    He tolerates anticoagulation.  He has had less than 1% atrial fib lasting as long as 31 minutes.  He will continue on the low-dose beta-blocker.  He is up-to-date with his amiodarone labs.  VT - He had normal ICD follow up on  the device check in late Sept. I reviewed this.  No statin beta-blocker and amiodarone.  HYPERTENSION -  This is being managed in the context of treating his CHF.  He was hypotensive at present.  He will remain on meds as above.   CAD - The patient has no new sypmtoms.  No  further cardiovascular testing is indicated.  We will continue with aggressive risk reduction and meds as listed.  CKD III Creatinine is stable at 1.67.  No change in therapy.    AAA Send him for an abdominal aortic aneurysm screen at the last visit because he reported having this in the past.  Was 3.2 cm.  I will follow him up in a couple of years.  He and I reviewed this today.    Current medicines are reviewed at length with the patient today.  The patient does not have concerns regarding medicines.  The following changes have been made:  None  Labs/ tests ordered today include: None  No orders of the defined types were placed in this encounter.    Disposition:   FU with me December  Signed, Herny Scurlock, MD  08/29/2019 5:30 PM    [place]

## 2019-08-29 NOTE — Telephone Encounter (Signed)
Patient seen in office today. 

## 2019-08-31 ENCOUNTER — Inpatient Hospital Stay (HOSPITAL_COMMUNITY): Payer: No Typology Code available for payment source

## 2019-08-31 ENCOUNTER — Encounter (HOSPITAL_COMMUNITY): Payer: Self-pay

## 2019-08-31 ENCOUNTER — Other Ambulatory Visit: Payer: Self-pay

## 2019-08-31 ENCOUNTER — Inpatient Hospital Stay (HOSPITAL_COMMUNITY)
Admission: EM | Admit: 2019-08-31 | Discharge: 2019-09-08 | DRG: 389 | Disposition: A | Discharge status: Home Health | Payer: No Typology Code available for payment source | Attending: Internal Medicine | Admitting: Internal Medicine

## 2019-08-31 ENCOUNTER — Emergency Department (HOSPITAL_COMMUNITY): Payer: No Typology Code available for payment source

## 2019-08-31 DIAGNOSIS — I509 Heart failure, unspecified: Secondary | ICD-10-CM

## 2019-08-31 DIAGNOSIS — K5669 Other partial intestinal obstruction: Secondary | ICD-10-CM | POA: Diagnosis not present

## 2019-08-31 DIAGNOSIS — Z20828 Contact with and (suspected) exposure to other viral communicable diseases: Secondary | ICD-10-CM | POA: Diagnosis not present

## 2019-08-31 DIAGNOSIS — Z4682 Encounter for fitting and adjustment of non-vascular catheter: Secondary | ICD-10-CM | POA: Diagnosis not present

## 2019-08-31 DIAGNOSIS — J449 Chronic obstructive pulmonary disease, unspecified: Secondary | ICD-10-CM | POA: Diagnosis present

## 2019-08-31 DIAGNOSIS — N1831 Chronic kidney disease, stage 3a: Secondary | ICD-10-CM | POA: Diagnosis present

## 2019-08-31 DIAGNOSIS — K56600 Partial intestinal obstruction, unspecified as to cause: Secondary | ICD-10-CM | POA: Diagnosis not present

## 2019-08-31 DIAGNOSIS — Z9049 Acquired absence of other specified parts of digestive tract: Secondary | ICD-10-CM | POA: Diagnosis not present

## 2019-08-31 DIAGNOSIS — N183 Chronic kidney disease, stage 3 unspecified: Secondary | ICD-10-CM | POA: Diagnosis not present

## 2019-08-31 DIAGNOSIS — R109 Unspecified abdominal pain: Secondary | ICD-10-CM

## 2019-08-31 DIAGNOSIS — I4891 Unspecified atrial fibrillation: Secondary | ICD-10-CM | POA: Diagnosis not present

## 2019-08-31 DIAGNOSIS — Z8249 Family history of ischemic heart disease and other diseases of the circulatory system: Secondary | ICD-10-CM

## 2019-08-31 DIAGNOSIS — I130 Hypertensive heart and chronic kidney disease with heart failure and stage 1 through stage 4 chronic kidney disease, or unspecified chronic kidney disease: Secondary | ICD-10-CM | POA: Diagnosis present

## 2019-08-31 DIAGNOSIS — E876 Hypokalemia: Secondary | ICD-10-CM | POA: Diagnosis not present

## 2019-08-31 DIAGNOSIS — I5042 Chronic combined systolic (congestive) and diastolic (congestive) heart failure: Secondary | ICD-10-CM | POA: Diagnosis not present

## 2019-08-31 DIAGNOSIS — Z825 Family history of asthma and other chronic lower respiratory diseases: Secondary | ICD-10-CM

## 2019-08-31 DIAGNOSIS — K56609 Unspecified intestinal obstruction, unspecified as to partial versus complete obstruction: Secondary | ICD-10-CM | POA: Diagnosis present

## 2019-08-31 DIAGNOSIS — Z9981 Dependence on supplemental oxygen: Secondary | ICD-10-CM | POA: Diagnosis not present

## 2019-08-31 DIAGNOSIS — I714 Abdominal aortic aneurysm, without rupture: Secondary | ICD-10-CM | POA: Diagnosis not present

## 2019-08-31 DIAGNOSIS — I428 Other cardiomyopathies: Secondary | ICD-10-CM | POA: Diagnosis not present

## 2019-08-31 DIAGNOSIS — J9611 Chronic respiratory failure with hypoxia: Secondary | ICD-10-CM | POA: Diagnosis present

## 2019-08-31 DIAGNOSIS — N179 Acute kidney failure, unspecified: Secondary | ICD-10-CM | POA: Diagnosis not present

## 2019-08-31 DIAGNOSIS — D631 Anemia in chronic kidney disease: Secondary | ICD-10-CM | POA: Diagnosis present

## 2019-08-31 DIAGNOSIS — J438 Other emphysema: Secondary | ICD-10-CM | POA: Diagnosis not present

## 2019-08-31 DIAGNOSIS — Z0189 Encounter for other specified special examinations: Secondary | ICD-10-CM

## 2019-08-31 DIAGNOSIS — Z7901 Long term (current) use of anticoagulants: Secondary | ICD-10-CM

## 2019-08-31 DIAGNOSIS — R911 Solitary pulmonary nodule: Secondary | ICD-10-CM | POA: Diagnosis not present

## 2019-08-31 DIAGNOSIS — Z888 Allergy status to other drugs, medicaments and biological substances status: Secondary | ICD-10-CM

## 2019-08-31 DIAGNOSIS — I5022 Chronic systolic (congestive) heart failure: Secondary | ICD-10-CM | POA: Diagnosis present

## 2019-08-31 DIAGNOSIS — K565 Intestinal adhesions [bands], unspecified as to partial versus complete obstruction: Secondary | ICD-10-CM | POA: Diagnosis not present

## 2019-08-31 DIAGNOSIS — Z7951 Long term (current) use of inhaled steroids: Secondary | ICD-10-CM

## 2019-08-31 DIAGNOSIS — N400 Enlarged prostate without lower urinary tract symptoms: Secondary | ICD-10-CM | POA: Diagnosis present

## 2019-08-31 DIAGNOSIS — E039 Hypothyroidism, unspecified: Secondary | ICD-10-CM | POA: Diagnosis not present

## 2019-08-31 DIAGNOSIS — I480 Paroxysmal atrial fibrillation: Secondary | ICD-10-CM | POA: Diagnosis present

## 2019-08-31 DIAGNOSIS — Z91048 Other nonmedicinal substance allergy status: Secondary | ICD-10-CM

## 2019-08-31 DIAGNOSIS — Z01810 Encounter for preprocedural cardiovascular examination: Secondary | ICD-10-CM | POA: Diagnosis not present

## 2019-08-31 DIAGNOSIS — I252 Old myocardial infarction: Secondary | ICD-10-CM | POA: Diagnosis not present

## 2019-08-31 DIAGNOSIS — I2510 Atherosclerotic heart disease of native coronary artery without angina pectoris: Secondary | ICD-10-CM | POA: Diagnosis present

## 2019-08-31 DIAGNOSIS — J439 Emphysema, unspecified: Secondary | ICD-10-CM | POA: Diagnosis present

## 2019-08-31 DIAGNOSIS — Z885 Allergy status to narcotic agent status: Secondary | ICD-10-CM

## 2019-08-31 DIAGNOSIS — Z91040 Latex allergy status: Secondary | ICD-10-CM

## 2019-08-31 DIAGNOSIS — Z95810 Presence of automatic (implantable) cardiac defibrillator: Secondary | ICD-10-CM | POA: Diagnosis not present

## 2019-08-31 DIAGNOSIS — Z79890 Hormone replacement therapy (postmenopausal): Secondary | ICD-10-CM

## 2019-08-31 DIAGNOSIS — E785 Hyperlipidemia, unspecified: Secondary | ICD-10-CM | POA: Diagnosis present

## 2019-08-31 DIAGNOSIS — K5652 Intestinal adhesions [bands] with complete obstruction: Secondary | ICD-10-CM | POA: Diagnosis not present

## 2019-08-31 DIAGNOSIS — Z03818 Encounter for observation for suspected exposure to other biological agents ruled out: Secondary | ICD-10-CM | POA: Diagnosis not present

## 2019-08-31 DIAGNOSIS — J90 Pleural effusion, not elsewhere classified: Secondary | ICD-10-CM | POA: Diagnosis not present

## 2019-08-31 DIAGNOSIS — R1084 Generalized abdominal pain: Secondary | ICD-10-CM

## 2019-08-31 DIAGNOSIS — R0609 Other forms of dyspnea: Secondary | ICD-10-CM

## 2019-08-31 DIAGNOSIS — Z881 Allergy status to other antibiotic agents status: Secondary | ICD-10-CM

## 2019-08-31 DIAGNOSIS — N281 Cyst of kidney, acquired: Secondary | ICD-10-CM | POA: Diagnosis not present

## 2019-08-31 DIAGNOSIS — R110 Nausea: Secondary | ICD-10-CM | POA: Diagnosis present

## 2019-08-31 DIAGNOSIS — N189 Chronic kidney disease, unspecified: Secondary | ICD-10-CM | POA: Diagnosis not present

## 2019-08-31 DIAGNOSIS — K5730 Diverticulosis of large intestine without perforation or abscess without bleeding: Secondary | ICD-10-CM | POA: Diagnosis not present

## 2019-08-31 DIAGNOSIS — Z4659 Encounter for fitting and adjustment of other gastrointestinal appliance and device: Secondary | ICD-10-CM

## 2019-08-31 DIAGNOSIS — Z978 Presence of other specified devices: Secondary | ICD-10-CM

## 2019-08-31 DIAGNOSIS — Z79899 Other long term (current) drug therapy: Secondary | ICD-10-CM

## 2019-08-31 DIAGNOSIS — Z87891 Personal history of nicotine dependence: Secondary | ICD-10-CM

## 2019-08-31 LAB — URINALYSIS, ROUTINE W REFLEX MICROSCOPIC
Bilirubin Urine: NEGATIVE
Glucose, UA: NEGATIVE mg/dL
Hgb urine dipstick: NEGATIVE
Ketones, ur: NEGATIVE mg/dL
Leukocytes,Ua: NEGATIVE
Nitrite: NEGATIVE
Protein, ur: NEGATIVE mg/dL
Specific Gravity, Urine: 1.02 (ref 1.005–1.030)
pH: 5 (ref 5.0–8.0)

## 2019-08-31 LAB — CBC WITH DIFFERENTIAL/PLATELET
Abs Immature Granulocytes: 0.04 10*3/uL (ref 0.00–0.07)
Basophils Absolute: 0 10*3/uL (ref 0.0–0.1)
Basophils Relative: 0 %
Eosinophils Absolute: 0 10*3/uL (ref 0.0–0.5)
Eosinophils Relative: 0 %
HCT: 38.1 % — ABNORMAL LOW (ref 39.0–52.0)
Hemoglobin: 12.8 g/dL — ABNORMAL LOW (ref 13.0–17.0)
Immature Granulocytes: 1 %
Lymphocytes Relative: 10 %
Lymphs Abs: 0.8 10*3/uL (ref 0.7–4.0)
MCH: 32.7 pg (ref 26.0–34.0)
MCHC: 33.6 g/dL (ref 30.0–36.0)
MCV: 97.4 fL (ref 80.0–100.0)
Monocytes Absolute: 0.5 10*3/uL (ref 0.1–1.0)
Monocytes Relative: 6 %
Neutro Abs: 6.8 10*3/uL (ref 1.7–7.7)
Neutrophils Relative %: 83 %
Platelets: 171 10*3/uL (ref 150–400)
RBC: 3.91 MIL/uL — ABNORMAL LOW (ref 4.22–5.81)
RDW: 13.8 % (ref 11.5–15.5)
WBC: 8.1 10*3/uL (ref 4.0–10.5)
nRBC: 0 % (ref 0.0–0.2)

## 2019-08-31 LAB — COMPREHENSIVE METABOLIC PANEL
ALT: 22 U/L (ref 0–44)
AST: 19 U/L (ref 15–41)
Albumin: 3.5 g/dL (ref 3.5–5.0)
Alkaline Phosphatase: 50 U/L (ref 38–126)
Anion gap: 12 (ref 5–15)
BUN: 15 mg/dL (ref 8–23)
CO2: 27 mmol/L (ref 22–32)
Calcium: 8.8 mg/dL — ABNORMAL LOW (ref 8.9–10.3)
Chloride: 94 mmol/L — ABNORMAL LOW (ref 98–111)
Creatinine, Ser: 1.62 mg/dL — ABNORMAL HIGH (ref 0.61–1.24)
GFR calc Af Amer: 46 mL/min — ABNORMAL LOW (ref >60)
GFR calc non Af Amer: 40 mL/min — ABNORMAL LOW (ref >60)
Glucose, Bld: 126 mg/dL — ABNORMAL HIGH (ref 70–99)
Potassium: 3.4 mmol/L — ABNORMAL LOW (ref 3.5–5.1)
Sodium: 133 mmol/L — ABNORMAL LOW (ref 135–145)
Total Bilirubin: 1.1 mg/dL (ref 0.3–1.2)
Total Protein: 6.1 g/dL — ABNORMAL LOW (ref 6.5–8.1)

## 2019-08-31 LAB — PROTIME-INR
INR: 1.1 (ref 0.8–1.2)
Prothrombin Time: 14.2 seconds (ref 11.4–15.2)

## 2019-08-31 LAB — HEPARIN LEVEL (UNFRACTIONATED): Heparin Unfractionated: >2.2 IU/mL — ABNORMAL HIGH (ref 0.30–0.70)

## 2019-08-31 LAB — APTT: aPTT: 30 seconds (ref 24–36)

## 2019-08-31 LAB — SARS CORONAVIRUS 2 (TAT 6-24 HRS): SARS Coronavirus 2: NEGATIVE

## 2019-08-31 LAB — LIPASE, BLOOD: Lipase: 17 U/L (ref 11–51)

## 2019-08-31 MED ORDER — AMIODARONE HCL 200 MG PO TABS
200.0000 mg | ORAL_TABLET | Freq: Every day | ORAL | Status: DC
  Administered 2019-08-31 – 2019-09-08 (×9): 200 mg via ORAL
  Filled 2019-08-31 (×9): qty 1

## 2019-08-31 MED ORDER — LEVOTHYROXINE SODIUM 100 MCG/5ML IV SOLN
66.0000 ug | Freq: Every day | INTRAVENOUS | Status: DC
  Administered 2019-09-01 – 2019-09-06 (×6): 66 ug via INTRAVENOUS
  Filled 2019-08-31 (×6): qty 5

## 2019-08-31 MED ORDER — FLUTICASONE PROPIONATE 50 MCG/ACT NA SUSP: 1.0000 | Freq: Every day | NASAL | Status: DC | PRN

## 2019-08-31 MED ORDER — HEPARIN (PORCINE) 25000 UT/250ML-% IV SOLN
1450.0000 [IU]/h | INTRAVENOUS | Status: DC
  Administered 2019-08-31: 1500 [IU]/h via INTRAVENOUS
  Filled 2019-08-31 (×2): qty 250

## 2019-08-31 MED ORDER — FENTANYL CITRATE (PF) 100 MCG/2ML IJ SOLN
100.0000 ug | Freq: Once | INTRAMUSCULAR | Status: AC
  Administered 2019-08-31: 100 ug via INTRAVENOUS
  Filled 2019-08-31: qty 2

## 2019-08-31 MED ORDER — FENTANYL CITRATE (PF) 100 MCG/2ML IJ SOLN
12.5000 ug | INTRAMUSCULAR | Status: DC | PRN
  Administered 2019-08-31 (×2): 25 ug via INTRAVENOUS
  Filled 2019-08-31 (×2): qty 2

## 2019-08-31 MED ORDER — ONDANSETRON HCL 4 MG/2ML IJ SOLN
4.0000 mg | Freq: Once | INTRAMUSCULAR | Status: AC
  Administered 2019-08-31: 4 mg via INTRAVENOUS
  Filled 2019-08-31: qty 2

## 2019-08-31 MED ORDER — HYDROMORPHONE HCL 1 MG/ML IJ SOLN
0.5000 mg | Freq: Once | INTRAMUSCULAR | Status: AC
  Administered 2019-08-31: 04:00:00 0.5 mg via INTRAVENOUS
  Filled 2019-08-31: qty 1

## 2019-08-31 MED ORDER — FAMOTIDINE IN NACL 20-0.9 MG/50ML-% IV SOLN
20.0000 mg | INTRAVENOUS | Status: DC
  Administered 2019-08-31 – 2019-09-07 (×8): 20 mg via INTRAVENOUS
  Filled 2019-08-31 (×8): qty 50

## 2019-08-31 MED ORDER — ACETAMINOPHEN 650 MG RE SUPP: 650.0000 mg | Freq: Four times a day (QID) | RECTAL | Status: DC | PRN

## 2019-08-31 MED ORDER — METOPROLOL TARTRATE 5 MG/5ML IV SOLN: 2.5000 mg | Freq: Four times a day (QID) | INTRAVENOUS | Status: DC | PRN

## 2019-08-31 MED ORDER — BISACODYL 10 MG RE SUPP
10.0000 mg | Freq: Once | RECTAL | Status: AC
  Administered 2019-08-31: 10 mg via RECTAL
  Filled 2019-08-31: qty 1

## 2019-08-31 MED ORDER — ACETAMINOPHEN 325 MG PO TABS
650.0000 mg | ORAL_TABLET | Freq: Four times a day (QID) | ORAL | Status: DC | PRN
  Administered 2019-09-05: 650 mg via ORAL
  Filled 2019-08-31: qty 2

## 2019-08-31 MED ORDER — UMECLIDINIUM BROMIDE 62.5 MCG/INH IN AEPB
1.0000 | INHALATION_SPRAY | Freq: Every day | RESPIRATORY_TRACT | Status: DC
  Filled 2019-08-31: qty 7

## 2019-08-31 MED ORDER — TAMSULOSIN HCL 0.4 MG PO CAPS
0.4000 mg | ORAL_CAPSULE | Freq: Every day | ORAL | Status: DC
  Administered 2019-08-31 – 2019-09-06 (×5): 0.4 mg via ORAL
  Filled 2019-08-31 (×6): qty 1

## 2019-08-31 MED ORDER — ONDANSETRON HCL 4 MG/2ML IJ SOLN
4.0000 mg | Freq: Four times a day (QID) | INTRAMUSCULAR | Status: DC | PRN
  Administered 2019-08-31 – 2019-09-02 (×4): 4 mg via INTRAVENOUS
  Filled 2019-08-31 (×4): qty 2

## 2019-08-31 MED ORDER — POLYVINYL ALCOHOL 1.4 % OP SOLN
1.0000 [drp] | Freq: Every day | OPHTHALMIC | Status: DC | PRN
  Administered 2019-09-06: 1 [drp] via OPHTHALMIC
  Filled 2019-08-31: qty 15

## 2019-08-31 MED ORDER — CARBOXYMETHYLCELLUL-GLYCERIN 0.5-0.9 % OP SOLN: 1.0000 [drp] | Freq: Every day | OPHTHALMIC | Status: DC | PRN

## 2019-08-31 MED ORDER — SODIUM CHLORIDE 0.9% FLUSH
3.0000 mL | Freq: Two times a day (BID) | INTRAVENOUS | Status: DC
  Administered 2019-08-31 – 2019-09-08 (×5): 3 mL via INTRAVENOUS

## 2019-08-31 MED ORDER — DIATRIZOATE MEGLUMINE & SODIUM 66-10 % PO SOLN
90.0000 mL | Freq: Once | ORAL | Status: AC
  Administered 2019-08-31: 90 mL via NASOGASTRIC
  Filled 2019-08-31: qty 90

## 2019-08-31 MED ORDER — ALBUTEROL SULFATE (2.5 MG/3ML) 0.083% IN NEBU: 2.5000 mg | INHALATION_SOLUTION | Freq: Four times a day (QID) | RESPIRATORY_TRACT | Status: DC | PRN

## 2019-08-31 MED ORDER — MOMETASONE FURO-FORMOTEROL FUM 200-5 MCG/ACT IN AERO
2.0000 | INHALATION_SPRAY | Freq: Two times a day (BID) | RESPIRATORY_TRACT | Status: DC
  Filled 2019-08-31: qty 8.8

## 2019-08-31 MED ORDER — PHENOL 1.4 % MT LIQD
1.0000 | OROMUCOSAL | Status: DC | PRN
  Administered 2019-08-31: 1 via OROMUCOSAL
  Filled 2019-08-31: qty 177

## 2019-08-31 MED ORDER — TIOTROPIUM BROMIDE MONOHYDRATE 2.5 MCG/ACT IN AERS: 2.0000 | INHALATION_SPRAY | Freq: Every morning | RESPIRATORY_TRACT | Status: DC

## 2019-08-31 MED ORDER — IOHEXOL 300 MG/ML  SOLN
30.0000 mL | Freq: Once | INTRAMUSCULAR | Status: AC | PRN
  Administered 2019-08-31: 30 mL via ORAL

## 2019-08-31 MED ORDER — ONDANSETRON HCL 4 MG PO TABS: 4.0000 mg | ORAL_TABLET | Freq: Four times a day (QID) | ORAL | Status: DC | PRN

## 2019-08-31 NOTE — ED Triage Notes (Signed)
Pt complains of abdominal pain and nausea since about 4pm yesterday, he has vomited in triage Pt is on home oxygen at 3 liters Pt states that he feels weak and nauseated and it may be food poison, but he and his wife ate the same thing

## 2019-08-31 NOTE — ED Notes (Addendum)
NG tube inserted pt tolerated well. Slight bleeding from left nares upon insertion to the right nares. Waiting for Xray to confirm placement

## 2019-08-31 NOTE — H&P (Signed)
[facility] History and Physical   Patient: Corey Tyler [id number]   PCP: [name], MD DOB: [birth year]   DOA: 08/31/2019   DOS: 08/31/2019   DOS: the patient was seen and examined on 08/31/2019  Patient coming from: The patient is coming from Home  Chief Complaint: Abdominal pain and nausea  HPI: Corey Tyler is a 78 y.o. male with Past medical history of A. fib, ASCVD, chronic systolic CHF EF [id number], COPD, HLD, HTN, ICD implant. Patient presented with complaints of abdominal pain and nausea. Patient was at his baseline until yesterday.  Around 4 PM patient started having complaints of abdominal pain with nausea. The pain was progressively worsening and his abdomen was also progressively getting more distended. He had an episode of vomiting. He because the symptoms were not getting better overnight, he decided to come to the hospital.  At the time of my evaluation he reports abdominal pain as well as nausea.  No vomiting since his arrival to ER.  He is not passing gas.  He had 3 bowel movements yesterday.  He mentioned that he had similar episode of obstruction in the past. No fever no chills. Reported constipation history. No chest pain cough no shortness of breath. No recent changes in medications reported.  ED Course: X-ray abdomen no showing evidence of small bowel obstruction.  CT scan was also positive for small bowel obstruction and therefore patient was referred for admission.  At his baseline ambulates without assistance independent for most of his ADL;  manages his medication on his own.  Review of Systems: as mentioned in the history of present illness.  All other systems reviewed and are negative.  Past Medical History:  Diagnosis Date   A-fib [facility])    a. Noted on 12/2012 interrogation, placed on Apixaban and ultimately stopped NOACs 2/2 personal decision 8/14.   AICD (automatic cardioverter/defibrillator) present    Cardiac defibrillator -dual  [employer]    Arthritis    Atrial tachycardia-non sustained    a. Noted on 03/2012 interrogation.   Chronic systolic heart failure (HCC)    EF down to 20 to 25% per echo November 2012   COPD (chronic obstructive pulmonary disease) ([place])    Hyperlipidemia    Hypertension    ICD (implantable cardiac defibrillator) in place    NICM (nonischemic cardiomyopathy) ([place])    a. Normal cors 2000. b. Minimal plaque 2013.   Old myocardial infarction    a. ?Silent MI. b. Large fixed inferolateral defect c/w with prior infarct, no ischemia. c. Cath in 2000/2013 with only minimal CAD.   Presence of permanent cardiac pacemaker    Pulmonary nodule, right    Last scan in 2010 showing stability; felt to be benign.   PVC's (premature ventricular contractions)    Ventricular tachycardia, polymorphic ([place])    Rx via ICD 12/14   Past Surgical History:  Procedure Laterality Date   [place]  03/30/2013   Procedure: CARDIOVERSION;  Surgeon: Thayer Headings, MD;  Location: [place];  Service: Cardiovascular;;   COLON SURGERY     COLONOSCOPY N/A 09/25/2013   Procedure: COLONOSCOPY;  Surgeon: Jerene Bears, MD;  Location: [facility];  Service: Endoscopy;  Laterality: N/A;   ICD  12/2011   [name]   IMPLANTABLE CARDIOVERTER DEFIBRILLATOR IMPLANT N/A 01/06/2012   Procedure: IMPLANTABLE CARDIOVERTER DEFIBRILLATOR IMPLANT;  Surgeon: Deboraha Sprang, MD;  Location: [place] CATH LAB;  Service: Cardiovascular;  Laterality: N/A;   PACEMAKER IMPLANT     [name]   TEE WITHOUT CARDIOVERSION N/A 03/30/2013   Procedure: TRANSESOPHAGEAL ECHOCARDIOGRAM (TEE);  Surgeon: Thayer Headings, MD;  Location: [place];  Service: Cardiovascular;  Laterality: N/A;   V TACH ABLATION  06/10/2017   V TACH ABLATION N/A 06/10/2017   Procedure: Stephanie Coup Ablation;  Surgeon: Evans Lance, MD;  Location: [place] CV LAB;  Service:  Cardiovascular;  Laterality: N/A;   Social History:  reports that he quit smoking about 35 years ago. His smoking use included cigarettes. He has a 100.00 pack-year smoking history. He has never used smokeless tobacco. He reports that he does not drink alcohol or use drugs.  Allergies  Allergen Reactions   Xarelto [Rivaroxaban] Other (See Comments)    Bleeding   Adhesive [Tape] Hives, Itching and Rash   Atorvastatin Cough   Codeine Nausea Only   Isosorbide Nitrate Other (See Comments)    Made him feel bad   Latex Itching and Other (See Comments)    Rash, itching, burning   Levofloxacin Other (See Comments)    Tachycardia   Lisinopril Cough   Lorazepam Other (See Comments)    "felt bad"   Mexiletine Other (See Comments)    GI distress   Sertraline Other (See Comments)    Made him feel bad    Family history reviewed and not pertinent Family History  Problem Relation Age of Onset   Emphysema Mother    Heart disease Mother        AVR   Heart failure Father        Died agwe 60   Esophageal cancer Brother    Colon cancer Neg Hx      Prior to Admission medications   Medication Sig Start Date End Date Taking? Authorizing Provider  albuterol (PROVENTIL) (2.5 MG/3ML) 0.083% nebulizer solution Take 2.5 mg by nebulization every 6 (six) hours as needed for wheezing or shortness of breath.   Yes [provider]  albuterol (VENTOLIN HFA) 108 (90 Base) MCG/ACT inhaler Inhale 2 puffs into the lungs every 6 (six) hours as needed for wheezing or shortness of breath.   Yes [provider]  amiodarone (PACERONE) 200 MG tablet Take 2 tablets (400 mg total) by mouth daily. Take 400mg  by mouth daily for 1 month, then resume 200mg  by mouth daily dose. Patient taking differently: Take 200 mg by mouth daily.  12/02/17  Yes Deboraha Sprang, MD  Carboxymethylcellul-Glycerin (LUBRICATING EYE DROPS OP) Place 1 drop into both eyes daily as needed (dry eyes).    Yes  [provider]  cholecalciferol (VITAMIN D) 1000 UNITS tablet Take 1,000 Units by mouth daily.   Yes [provider]  ELIQUIS 5 MG TABS tablet TAKE 1 TABLET BY MOUTH TWICE A DAY Patient taking differently: Take 5 mg by mouth 2 (two) times daily.  07/30/15  Yes Minus Breeding, MD  finasteride (PROSCAR) 5 MG tablet Take 5 mg by mouth daily.    Yes [provider]  fluticasone (FLONASE) 50 MCG/ACT nasal spray Place 1 spray into both nostrils daily as needed for allergies.  09/08/17  Yes [provider]  Fluticasone-Salmeterol (ADVAIR DISKUS) 250-50 MCG/DOSE AEPB Inhale 1 puff into the lungs 2 (two) times daily as needed (shortness of breath/wheezing).    Yes [provider]  levothyroxine (SYNTHROID, LEVOTHROID) 125 MCG tablet Take 125 mcg by mouth daily. 06/15/17  Yes  [provider]  loratadine (CLARITIN) 10 MG tablet Take 10 mg by mouth daily as needed for allergies.    Yes [provider]  MAGNESIUM PO Take 1 tablet by mouth at bedtime.   Yes [provider]  Melatonin 3 MG TABS Take 3 mg by mouth at bedtime.   Yes [provider]  metoprolol tartrate (LOPRESSOR) 50 MG tablet Take 0.5 tablets (25 mg total) by mouth 2 (two) times daily. 07/30/19  Yes Domenic Polite, MD  Multiple Vitamin (MULTIVITAMIN WITH MINERALS) TABS tablet Take 1 tablet by mouth daily.   Yes [provider]  nitroGLYCERIN (NITROSTAT) 0.4 MG SL tablet Place 0.4 mg under the tongue every 5 (five) minutes as needed for chest pain.  11/12/11  Yes Burtis Junes, NP  Omega-3 Fatty Acids (FISH OIL) 500 MG CAPS Take 500 mg by mouth 2 (two) times daily.   Yes [provider]  OXYGEN Inhale 3-6 L into the lungs daily. 3 at rest, 4-6 with exertion   Yes [provider]  pantoprazole (PROTONIX) 40 MG tablet Take 40 mg by mouth daily as needed (acid reflux/indigestion).   Yes [provider]  potassium chloride (K-DUR) 10 MEQ  tablet Take 10 mEq by mouth at bedtime.    Yes [provider]  promethazine (PHENERGAN) 25 MG tablet Take 25 mg by mouth daily as needed for nausea or vomiting.  04/20/17  Yes [provider]  Psyllium (METAMUCIL PO) Take 1 Dose by mouth daily. Mix in liquid and drink    Yes [provider]  ranolazine (RANEXA) 1000 MG SR tablet Take 1 tablet (1,000 mg total) by mouth 2 (two) times daily. 09/23/18  Yes Deboraha Sprang, MD  Tamsulosin HCl (FLOMAX) 0.4 MG CAPS Take 0.4 mg by mouth at bedtime.    Yes [provider]  Tiotropium Bromide Monohydrate (SPIRIVA RESPIMAT) 2.5 MCG/ACT AERS Inhale 2 puffs into the lungs every morning.   Yes [provider]  torsemide (DEMADEX) 20 MG tablet Take 1 tablet by mouth once daily Patient taking differently: Take 20-30 mg by mouth daily. 20 MG on Monday, Tuesday, Wednesday, Thursday, Friday 30 MG on Saturday and Sunday 06/20/19  Yes Seiler, Luetta Nutting K, NP  acetaminophen (TYLENOL) 500 MG tablet Take 500 mg by mouth every 6 (six) hours as needed for headache (pain).    [provider]    Physical Exam: Vitals:   08/31/19 1230 08/31/19 1300 08/31/19 1414 08/31/19 1800  BP: 133/77 135/77 129/73   Pulse: 61 (!) 54 (!) 59   Resp:  16 14   Temp:   98.4 F (36.9 C)   TempSrc:   Oral   SpO2: (!) 89% 92% 92% 90%  Weight:      Height:        General: alert and oriented to time, place, and person. Appear in mild distress, affect appropriate Eyes: PERRL, Conjunctiva normal ENT: Oral Mucosa Clear, moist  Neck: no JVD, no Abnormal Mass Or lumps Cardiovascular: S1 and S2 Present, no Murmur, peripheral pulses symmetrical Respiratory: good respiratory effort, Bilateral Air entry equal and Decreased, no signs of accessory muscle use, basal Crackles, no wheezes Abdomen: Bowel Sound present, Soft and mild tenderness, no hernia Skin: no rashes  Extremities: bilateral Pedal edema, no calf tenderness Neurologic: without any  new focal findings Gait not checked due to patient safety concerns  Data Reviewed: I have personally reviewed and interpreted labs, imaging as discussed below.  CBC: Recent Labs  Lab 08/31/19 0421  WBC 8.1  NEUTROABS 6.8  HGB 12.8*  HCT 38.1*  MCV 97.4  PLT [id number]   Basic Metabolic Panel: Recent Labs  Lab 08/31/19 0421  NA 133*  K 3.4*  CL 94*  CO2 27  GLUCOSE 126*  BUN 15  CREATININE 1.62*  CALCIUM 8.8*   GFR: Estimated Creatinine Clearance: 46.6 mL/min (A) (by C-G formula based on SCr of 1.62 mg/dL (H)). Liver Function Tests: Recent Labs  Lab 08/31/19 0421  AST 19  ALT 22  ALKPHOS 50  BILITOT 1.1  PROT 6.1*  ALBUMIN 3.5   Recent Labs  Lab 08/31/19 0421  LIPASE 17   No results for input(s): AMMONIA in the last 168 hours. Coagulation Profile: No results for input(s): INR, PROTIME in the last 168 hours. Cardiac Enzymes: No results for input(s): CKTOTAL, CKMB, CKMBINDEX, TROPONINI in the last 168 hours. BNP (last 3 results) No results for input(s): PROBNP in the last 8760 hours. HbA1C: No results for input(s): HGBA1C in the last 72 hours. CBG: No results for input(s): GLUCAP in the last 168 hours. Lipid Profile: No results for input(s): CHOL, HDL, LDLCALC, TRIG, CHOLHDL, LDLDIRECT in the last 72 hours. Thyroid Function Tests: No results for input(s): TSH, T4TOTAL, FREET4, T3FREE, THYROIDAB in the last 72 hours. Anemia Panel: No results for input(s): VITAMINB12, FOLATE, FERRITIN, TIBC, IRON, RETICCTPCT in the last 72 hours. Urine analysis:    Component Value Date/Time   COLORURINE AMBER (A) 08/31/2019 0433   APPEARANCEUR HAZY (A) 08/31/2019 0433   LABSPEC 1.020 08/31/2019 0433   PHURINE 5.0 08/31/2019 0433   GLUCOSEU NEGATIVE 08/31/2019 0433   HGBUR NEGATIVE 08/31/2019 0433   BILIRUBINUR NEGATIVE 08/31/2019 0433   KETONESUR NEGATIVE 08/31/2019 0433   PROTEINUR NEGATIVE 08/31/2019 0433   UROBILINOGEN 0.2 07/18/2015 1154   NITRITE NEGATIVE  08/31/2019 0433   LEUKOCYTESUR NEGATIVE 08/31/2019 0433    Radiological Exams on Admission: Ct Abdomen Pelvis Wo Contrast  Result Date: 08/31/2019 CLINICAL DATA:  Abdominal distension and acute abdominal pain with nausea since yesterday. EXAM: CT ABDOMEN AND PELVIS WITHOUT CONTRAST TECHNIQUE: Multidetector CT imaging of the abdomen and pelvis was performed following the standard protocol without IV contrast. COMPARISON:  CT scan 01/26/2018 FINDINGS: Lower chest: Small left pleural effusion noted with overlying atelectasis. Significant underlying emphysematous changes. Mild eventration right hemidiaphragm with overlying atelectasis. The heart is mildly enlarged. Coronary artery and aortic calcifications are noted. Pacer wires are noted. Hepatobiliary: No focal hepatic lesions are identified without contrast. No intrahepatic biliary dilatation. Layering high attenuation material in the gallbladder could be sludge or stones. No common bile duct dilatation. Pancreas: Prominent fatty interstices but no mass, inflammation or ductal dilatation. Small duodenum diverticulum noted near the pancreatic head. Spleen: Normal size.  No focal lesions. Adrenals/Urinary Tract: The adrenal glands are normal. Small renal cysts but no worrisome renal lesions are identified without contrast. No renal or obstructing ureteral calculi. The bladder is unremarkable. Stomach/Bowel: The stomach is mildly distended with contrast. The duodenum is unremarkable. Proximal loops of jejunum are normal. The mid jejunal loops are dilated and demonstrate air-fluid levels. There is a transition to normal/decompressed mid distal small bowel consistent with a small bowel obstruction. This is likely due to adhesions as no obstructing mass is identified. The terminal ileum is normal. The colon is relatively decompressed. There is diffuse colonic diverticulosis but no findings for acute diverticulitis and no mass. The appendix is surgically absent.  Vascular/Lymphatic: Advanced atherosclerotic calcifications involving the aorta and branch  vessels. There is a focal infrarenal abdominal aortic aneurysm measuring 3.1 cm just above the iliac artery bifurcation. This is stable. Fusiform ectasia and calcification of the iliac arteries. No mesenteric or retroperitoneal mass or adenopathy. Reproductive: The prostate gland and seminal vesicles are unremarkable. Other: No ascites or abdominal wall hernia. No pelvic mass or pelvic adenopathy. No inguinal mass or adenopathy. Musculoskeletal: No significant bony findings. IMPRESSION: CT findings consistent with a mid small bowel obstruction, likely due to adhesions. No abdominal mass, adenopathy or evidence of internal hernia. Advanced atherosclerotic calcifications with a 3.1 cm infrarenal abdominal aortic aneurysm, unchanged. Small left pleural effusion and bibasilar atelectasis. Electronically Signed   By: Marijo Sanes M.D.   On: 08/31/2019 08:35   Dg Chest Port 1 View  Result Date: 08/31/2019 CLINICAL DATA:  Small bowel obstruction. EXAM: PORTABLE CHEST 1 VIEW COMPARISON:  Chest x-ray dated July 29, 2019. FINDINGS: Enteric tube entering the stomach with the tip below the field of view. Unchanged left chest wall pacemaker. Stable cardiomegaly. Normal pulmonary vascularity. Unchanged small left pleural effusion. Chronic bibasilar atelectasis/scarring. No pneumothorax. No acute osseous abnormality. IMPRESSION: 1. Unchanged small left pleural effusion. Electronically Signed   By: Titus Dubin M.D.   On: 08/31/2019 15:35   Dg Abd Portable 1v  Result Date: 08/31/2019 CLINICAL DATA:  Nasogastric tube placement EXAM: PORTABLE ABDOMEN - 1 VIEW COMPARISON:  Portable exam 1548 hours compared to 08/31/2019 at 1200 hours FINDINGS: Nasogastric tube coiled in proximal stomach with tip reflected back into the distal esophagus; recommend withdrawal and replace. Scattered air-filled loops of large and small bowel  throughout abdomen. Gaseous distention of stomach. Bones demineralized. IMPRESSION: Nasogastric tube coiled in proximal stomach with tip reflected into the distal esophagus; recommend withdrawal and replacement. Findings called to [place] on 5E on 08/31/2019 at 1621 hours. Electronically Signed   By: Lavonia Dana M.D.   On: 08/31/2019 16:22   Dg Abd Portable 1v-small Bowel Protocol-position Verification  Result Date: 08/31/2019 CLINICAL DATA:  Reposition NG tube EXAM: PORTABLE ABDOMEN - 1 VIEW COMPARISON:  08/25/2019 FINDINGS: Nasogastric tube coiled in the distal esophagus with the tip redirected cephalad in the mid-distal esophagus. Recommend repositioning prior to use. There is no bowel dilatation to suggest obstruction. There is no evidence of pneumoperitoneum, portal venous gas or pneumatosis. There are no pathologic calcifications along the expected course of the ureters. The osseous structures are unremarkable. IMPRESSION: Nasogastric tube coiled in the distal esophagus with the tip redirected cephalad in the mid-distal esophagus. Recommend repositioning prior to use. Electronically Signed   By: Kathreen Devoid   On: 08/31/2019 12:54   Dg Abd Portable 1 View  Result Date: 08/31/2019 CLINICAL DATA:  NG placement. EXAM: PORTABLE ABDOMEN - 1 VIEW COMPARISON:  None. FINDINGS: The NG tube is kinked back on itself in the distal esophagus and should be advanced several cm where it should unfold in the stomach. Scattered air-filled loops of small bowel and also air and stool throughout the colon. No free air. The bony structures are unremarkable. IMPRESSION: The NG tube is kinked back on itself in the distal esophagus and should be advanced several cm. Electronically Signed   By: Marijo Sanes M.D.   On: 08/31/2019 11:41   I reviewed all nursing notes, pharmacy notes, vitals, pertinent old records.  Assessment/Plan 1. Small bowel obstruction ([place]) Presents with complaints of abdominal pain nausea and  vomiting as well as distention. CT scan shows evidence of small bowel obstruction. NG tube will be  inserted. Patient will be started on small bowel protocol. General surgery is consulted. Remains n.p.o. per No medications by mouth other than Flomax and amiodarone Currently holding oral anticoagulation. We will transition to IV heparin.  2.  Chronic systolic CHF Paroxysmal A. fib ICD implantation. Chronic anticoagulation. Chronic respiratory failure on 3 LPM at home.  Patient has significant swelling of his legs although per family this appears to be chronic in nature. Patient is on torsemide at home which I will be holding and actually will be giving the patient some gentle IV hydration. Monitor for volume overload. Patient is on amiodarone for his A. fib which I will continue. Patient is on Eliquis for his A. fib which I will discontinue and transition to heparin. Continue with oxygen. Transition to telemetry.  3.  COPD. Currently does not appear to be in flareup. Continue home inhalers.  4.  BPH. Continue Flomax.  5.  Chronic nausea. Wife reports that the patient actually has been suffering from having chronic nausea as well as anorexia ongoing for last 3 months. Currently do not think that the patient's nausea which is chronic coming from bowel obstruction. This might be related to his chronic CHF which is poorly controlled. Once patient's current small bowel obstruction improves patient will require further outpatient work-up for the same.  6.  Hypothyroidism protection patient is on Synthroid 120 at home will transition to IV for now.  Nutrition: NPO  DVT Prophylaxis: Therapeutic Anticoagulation with Heparin  Advance goals of care discussion: Full code   Consults: General surgery   Family Communication: family was present at bedside, at the time of interview.  Opportunity was given to ask question and all questions were answered satisfactorily.  Disposition:  Admitted as inpatient, telemetry unit. Likely to be discharged home, in 3-4 days.  I have discussed plan of care as described above with RN and patient/family.  Author: Berle Mull, MD [facility] 08/31/2019 6:43 PM   To reach On-call, see care teams to locate the attending and reach out to them via [email or web address]. If 7PM-7AM, please contact night-coverage If you still have difficulty reaching the attending provider, please page the [facility] (Director on Call) for [facility] on amion for assistance.

## 2019-08-31 NOTE — ED Provider Notes (Signed)
[facility] DEPT Provider Note   CSN: [id number] Arrival date & time: 08/31/19  [id number]     History   Chief Complaint No chief complaint on file.   HPI Corey Tyler is a 78 y.o. male.     Patient presents to the emergency department for evaluation of abdominal pain.  Patient experiencing diffuse abdominal pain with nausea since 4 PM yesterday.  Patient reports that his abdomen has gotten more distended over time.  He has vomited 1 time.  Patient had 3 bowel movements yesterday which were normal for him.  He does have a history of diverticulitis.  He also has had partial colectomy.  He has not had a fever.     Past Medical History:  Diagnosis Date  . A-fib ([place])    a. Noted on 12/2012 interrogation, placed on Apixaban and ultimately stopped NOACs 2/2 personal decision 8/14.  Marland Kitchen AICD (automatic cardioverter/defibrillator) present    Cardiac defibrillator -dual  [employer]  . Arthritis   . Atrial tachycardia-non sustained    a. Noted on 03/2012 interrogation.  . Chronic systolic heart failure (HCC)    EF down to 20 to 25% per echo November 2012  . COPD (chronic obstructive pulmonary disease) ([place])   . Hyperlipidemia   . Hypertension   . ICD (implantable cardiac defibrillator) in place   . NICM (nonischemic cardiomyopathy) ([place])    a. Normal cors 2000. b. Minimal plaque 2013.  . Old myocardial infarction    a. ?Silent MI. b. Large fixed inferolateral defect c/w with prior infarct, no ischemia. c. Cath in 2000/2013 with only minimal CAD.  Marland Kitchen Presence of permanent cardiac pacemaker   . Pulmonary nodule, right    Last scan in 2010 showing stability; felt to be benign.  Marland Kitchen PVC's (premature ventricular contractions)   . Ventricular tachycardia, polymorphic ([place])    Rx via ICD 12/14    Patient Active Problem List   Diagnosis Date Noted  . Generalized weakness 07/29/2019  . Acute on chronic kidney failure ([place]) 07/29/2019  . Acute respiratory failure  ([place]) 09/15/2017  . COPD exacerbation ([place]) 09/15/2017  . Sustained ventricular tachycardia ([place]) 06/10/2017  . VT (ventricular tachycardia) ([place]) 11/02/2016  . Benign fibroma of prostate 04/02/2015  . Ventricular tachycardia, polymorphic ([place])   . Diverticulosis of colon without hemorrhage 09/25/2013  . Atrial fibrillation ([place]) 12/29/2012  . Cardiac defibrillator -dual  [employer]   . Hyperlipidemia   . PVCs 11/26/2011  . Chronic systolic heart failure ([place]) 10/07/2011  . Cardiomyopathy, nonischemic and ischemic 09/24/2011  . 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 PULMONARY NODULE 12/04/2008    Past Surgical History:  Procedure Laterality Date  . APPENDECTOMY    . CARDIAC CATHETERIZATION  2000  . CARDIAC DEFIBRILLATOR PLACEMENT    . CARDIOVERSION  03/30/2013   Procedure: CARDIOVERSION;  Surgeon: Thayer Headings, MD;  Location: [facility];  Service: Cardiovascular;;  . COLON SURGERY    . COLONOSCOPY N/A 09/25/2013   Procedure: COLONOSCOPY;  Surgeon: Jerene Bears, MD;  Location: [facility];  Service: Endoscopy;  Laterality: N/A;  . ICD  12/2011   [name]  . IMPLANTABLE CARDIOVERTER DEFIBRILLATOR IMPLANT N/A 01/06/2012   Procedure: IMPLANTABLE CARDIOVERTER DEFIBRILLATOR IMPLANT;  Surgeon: Deboraha Sprang, MD;  Location: [facility] CATH LAB;  Service: Cardiovascular;  Laterality: N/A;  . PACEMAKER IMPLANT     [employer] [name]  . TEE WITHOUT CARDIOVERSION N/A 03/30/2013   Procedure: TRANSESOPHAGEAL ECHOCARDIOGRAM (  TEE);  Surgeon: Thayer Headings, MD;  Location: [facility];  Service: Cardiovascular;  Laterality: N/A;  . V TACH ABLATION  06/10/2017  . V TACH ABLATION N/A 06/10/2017   Procedure: V Tach Ablation;  Surgeon: Evans Lance, MD;  Location: [facility] CV LAB;  Service: Cardiovascular;  Laterality: N/A;        Home Medications    Prior to Admission medications   Medication Sig Start Date End Date Taking? Authorizing Provider  albuterol  (PROVENTIL) (2.5 MG/3ML) 0.083% nebulizer solution Take 2.5 mg by nebulization every 6 (six) hours as needed for wheezing or shortness of breath.   Yes [provider]  albuterol (VENTOLIN HFA) 108 (90 Base) MCG/ACT inhaler Inhale 2 puffs into the lungs every 6 (six) hours as needed for wheezing or shortness of breath.   Yes [provider]  amiodarone (PACERONE) 200 MG tablet Take 2 tablets (400 mg total) by mouth daily. Take 400mg  by mouth daily for 1 month, then resume 200mg  by mouth daily dose. Patient taking differently: Take 200 mg by mouth daily.  12/02/17  Yes Deboraha Sprang, MD  Carboxymethylcellul-Glycerin (LUBRICATING EYE DROPS OP) Place 1 drop into both eyes daily as needed (dry eyes).    Yes [provider]  cholecalciferol (VITAMIN D) 1000 UNITS tablet Take 1,000 Units by mouth daily.   Yes [provider]  ELIQUIS 5 MG TABS tablet TAKE 1 TABLET BY MOUTH TWICE A DAY Patient taking differently: Take 5 mg by mouth 2 (two) times daily.  07/30/15  Yes Minus Breeding, MD  finasteride (PROSCAR) 5 MG tablet Take 5 mg by mouth daily.    Yes [provider]  fluticasone (FLONASE) 50 MCG/ACT nasal spray Place 1 spray into both nostrils daily as needed for allergies.  09/08/17  Yes [provider]  Fluticasone-Salmeterol (ADVAIR DISKUS) 250-50 MCG/DOSE AEPB Inhale 1 puff into the lungs 2 (two) times daily as needed (shortness of breath/wheezing).    Yes [provider]  levothyroxine (SYNTHROID, LEVOTHROID) 125 MCG tablet Take 125 mcg by mouth daily. 06/15/17  Yes [provider]  loratadine (CLARITIN) 10 MG tablet Take 10 mg by mouth daily as needed for allergies.    Yes [provider]  MAGNESIUM PO Take 1 tablet by mouth at bedtime.   Yes [provider]  Melatonin 3 MG TABS Take 3 mg by mouth at bedtime.   Yes [provider]  metoprolol tartrate (LOPRESSOR) 50 MG tablet Take 0.5 tablets (25 mg  total) by mouth 2 (two) times daily. 07/30/19  Yes Domenic Polite, MD  Multiple Vitamin (MULTIVITAMIN WITH MINERALS) TABS tablet Take 1 tablet by mouth daily.   Yes [provider]  nitroGLYCERIN (NITROSTAT) 0.4 MG SL tablet Place 0.4 mg under the tongue every 5 (five) minutes as needed for chest pain.  11/12/11  Yes Burtis Junes, NP  Omega-3 Fatty Acids (FISH OIL) 500 MG CAPS Take 500 mg by mouth 2 (two) times daily.   Yes [provider]  OXYGEN Inhale 3-6 L into the lungs daily. 3 at rest, 4-6 with exertion   Yes [provider]  pantoprazole (PROTONIX) 40 MG tablet Take 40 mg by mouth daily as needed (acid reflux/indigestion).   Yes [provider]  potassium chloride (K-DUR) 10 MEQ tablet Take 10 mEq by mouth at bedtime.    Yes [provider]  promethazine (PHENERGAN) 25 MG tablet Take 25 mg by mouth daily as needed for nausea or  vomiting.  04/20/17  Yes [provider]  Psyllium (METAMUCIL PO) Take 1 Dose by mouth daily. Mix in liquid and drink    Yes [provider]  ranolazine (RANEXA) 1000 MG SR tablet Take 1 tablet (1,000 mg total) by mouth 2 (two) times daily. 09/23/18  Yes Deboraha Sprang, MD  Tamsulosin HCl (FLOMAX) 0.4 MG CAPS Take 0.4 mg by mouth at bedtime.    Yes [provider]  Tiotropium Bromide Monohydrate (SPIRIVA RESPIMAT) 2.5 MCG/ACT AERS Inhale 2 puffs into the lungs every morning.   Yes [provider]  torsemide (DEMADEX) 20 MG tablet Take 1 tablet by mouth once daily Patient taking differently: Take 20-30 mg by mouth daily. 20 MG on Monday, Tuesday, Wednesday, Thursday, Friday 30 MG on Saturday and Sunday 06/20/19  Yes Seiler, Luetta Nutting K, NP  acetaminophen (TYLENOL) 500 MG tablet Take 500 mg by mouth every 6 (six) hours as needed for headache (pain).    [provider]    Family History Family History  Problem Relation Age of Onset  . Emphysema Mother   . Heart disease Mother         AVR  . Heart failure Father        Died agwe 92  . Esophageal cancer Brother   . Colon cancer Neg Hx     Social History Social History   Tobacco Use  . Smoking status: Former Smoker    Packs/day: 2.50    Years: 40.00    Pack years: 100.00    Types: Cigarettes    Quit date: 11/24/1983    Years since quitting: 35.7  . Smokeless tobacco: Never Used  . Tobacco comment: 2 1/2 ppd x 40 years  Substance Use Topics  . Alcohol use: No  . Drug use: No     Allergies   Xarelto [rivaroxaban], Adhesive [tape], Atorvastatin, Codeine, Isosorbide nitrate, Latex, Levofloxacin, Lisinopril, Lorazepam, Mexiletine, and Sertraline   Review of Systems Review of Systems  Gastrointestinal: Positive for abdominal distention, abdominal pain, nausea and vomiting.  All other systems reviewed and are negative.    Physical Exam Updated Vital Signs BP 139/83   Pulse 64   Temp 98.6 F (37 C) (Oral)   Resp 16   Ht 6\' 1"  (1.854 m)   Wt 103 kg   SpO2 91%   BMI 29.95 kg/m   Physical Exam Vitals signs and nursing note reviewed.  Constitutional:      General: He is not in acute distress.    Appearance: Normal appearance. He is well-developed.  HENT:     Head: Normocephalic and atraumatic.     Right Ear: Hearing normal.     Left Ear: Hearing normal.     Nose: Nose normal.  Eyes:     Conjunctiva/sclera: Conjunctivae normal.     Pupils: Pupils are equal, round, and reactive to light.  Neck:     Musculoskeletal: Normal range of motion and neck supple.  Cardiovascular:     Rate and Rhythm: Regular rhythm.     Heart sounds: S1 normal and S2 normal. No murmur. No friction rub. No gallop.   Pulmonary:     Effort: Pulmonary effort is normal. No respiratory distress.     Breath sounds: Normal breath sounds.  Chest:     Chest wall: No tenderness.  Abdominal:     General: Bowel sounds are decreased. There is distension.     Palpations: Abdomen is soft.     Tenderness: There is  abdominal  tenderness in the right upper quadrant, epigastric area, periumbilical area and left upper quadrant. There is no guarding or rebound. Negative signs include Murphy's sign and McBurney's sign.     Hernia: No hernia is present.  Musculoskeletal: Normal range of motion.  Skin:    General: Skin is warm and dry.     Findings: No rash.  Neurological:     Mental Status: He is alert and oriented to person, place, and time.     GCS: GCS eye subscore is 4. GCS verbal subscore is 5. GCS motor subscore is 6.     Cranial Nerves: No cranial nerve deficit.     Sensory: No sensory deficit.     Coordination: Coordination normal.  Psychiatric:        Speech: Speech normal.        Behavior: Behavior normal.        Thought Content: Thought content normal.      ED Treatments / Results  Labs (all labs ordered are listed, but only abnormal results are displayed) Labs Reviewed  CBC WITH DIFFERENTIAL/PLATELET - Abnormal; Notable for the following components:      Result Value   RBC 3.91 (*)    Hemoglobin 12.8 (*)    HCT 38.1 (*)    All other components within normal limits  COMPREHENSIVE METABOLIC PANEL - Abnormal; Notable for the following components:   Sodium 133 (*)    Potassium 3.4 (*)    Chloride 94 (*)    Glucose, Bld 126 (*)    Creatinine, Ser 1.62 (*)    Calcium 8.8 (*)    Total Protein 6.1 (*)    GFR calc non Af Amer 40 (*)    GFR calc Af Amer 46 (*)    All other components within normal limits  URINALYSIS, ROUTINE W REFLEX MICROSCOPIC - Abnormal; Notable for the following components:   Color, Urine AMBER (*)    APPearance HAZY (*)    All other components within normal limits  LIPASE, BLOOD    EKG None  Radiology No results found.  Procedures Procedures (including critical care time)  Medications Ordered in ED Medications  HYDROmorphone (DILAUDID) injection 0.5 mg (0.5 mg Intravenous Given 08/31/19 0420)  ondansetron (ZOFRAN) injection 4 mg (4 mg Intravenous Given 08/31/19  0420)  iohexol (OMNIPAQUE) 300 MG/ML solution 30 mL (30 mLs Oral Contrast Given 08/31/19 0546)     Initial Impression / Assessment and Plan / ED Course  I have reviewed the triage vital signs and the nursing notes.  Pertinent labs & imaging results that were available during my care of the patient were reviewed by me and considered in my medical decision making (see chart for details).        Patient presents to the emergency department for evaluation of abdominal pain and distention.  Symptoms have begun overnight.  He does have a history of diverticulitis.  Exam, however, reveals distention, tympanitic abdomen with decreased bowel sounds.  He has had a previous partial colectomy secondary to diverticulitis.  CT scan will be performed to evaluate for recurrent diverticulitis as well as small bowel obstruction.  Will sign out to oncoming ER physician to follow-up CT.  Final Clinical Impressions(s) / ED Diagnoses   Final diagnoses:  Generalized abdominal pain    ED Discharge Orders    None       Orpah Greek, MD 08/31/19 [phone]

## 2019-08-31 NOTE — ED Notes (Signed)
Pt has hx of diverticulitis and he still has his gall bladder

## 2019-08-31 NOTE — Progress Notes (Signed)
Patient was transferred to 5 E with NG tube inserted in ED. Alert and oriented x 4. Room is set up, Wife is at bedside.

## 2019-08-31 NOTE — Consult Note (Signed)
Durand Bussell Quinn Axe 1941/07/31  [id number].    Requesting MD: Dr. Posey Pronto Chief Complaint/Reason for Consult: SBO  HPI: KERT MAKOSKY is a 78 y.o. male with a history of HTN, HLD, COPD on home O2, CHF, NICM, CAD, prior V. tach status post ablation &ICD, A. Fib on Elqius with a prior abdominal surgical history including sigmoid colectomy and appendectomy who presented to [facility] long for abdominal pain and nausea.  Patient reports that yesterday evening he began having generalized, diffuse, crampy abdominal pain with associated nausea and abdominal distention.  He did have 3 normal BMs yesterday.  He is no longer passing any flatus.  He denies any emesis.  No prior history of SBO.  He was evaluated in the ED and had a CT abdomen pelvis done that showed mid small bowel obstruction, likely due to adhesions with a transition point in the mid to distal small bowel.  General surgery was asked to consult.  ROS: Review of Systems  Constitutional: Negative for chills and fever.  Cardiovascular: Negative for chest pain.  Gastrointestinal: Positive for abdominal pain, constipation and nausea. Negative for blood in stool, diarrhea and vomiting.  Genitourinary: Negative for dysuria.  All other systems reviewed and are negative.   Family History  Problem Relation Age of Onset   Emphysema Mother    Heart disease Mother        AVR   Heart failure Father        Died agwe 65   Esophageal cancer Brother    Colon cancer Neg Hx     Past Medical History:  Diagnosis Date   A-fib ([place])    a. Noted on 12/2012 interrogation, placed on Apixaban and ultimately stopped NOACs 2/2 personal decision 8/14.   AICD (automatic cardioverter/defibrillator) present    Cardiac defibrillator -dual  [employer]   Arthritis    Atrial tachycardia-non sustained    a. Noted on 03/2012 interrogation.   Chronic systolic heart failure (HCC)    EF down to 20 to 25% per echo November 2012   COPD (chronic obstructive  pulmonary disease) ([place])    Hyperlipidemia    Hypertension    ICD (implantable cardiac defibrillator) in place    NICM (nonischemic cardiomyopathy) ([place])    a. Normal cors 2000. b. Minimal plaque 2013.   Old myocardial infarction    a. ?Silent MI. b. Large fixed inferolateral defect c/w with prior infarct, no ischemia. c. Cath in 2000/2013 with only minimal CAD.   Presence of permanent cardiac pacemaker    Pulmonary nodule, right    Last scan in 2010 showing stability; felt to be benign.   PVC's (premature ventricular contractions)    Ventricular tachycardia, polymorphic ([place])    Rx via ICD 12/14    Past Surgical History:  Procedure Laterality Date   [place]  03/30/2013   Procedure: CARDIOVERSION;  Surgeon: Thayer Headings, MD;  Location: [facility];  Service: Cardiovascular;;   COLON SURGERY     COLONOSCOPY N/A 09/25/2013   Procedure: COLONOSCOPY;  Surgeon: Jerene Bears, MD;  [facility];  Service: Endoscopy;  Laterality: N/A;   ICD  12/2011   [name]   IMPLANTABLE CARDIOVERTER DEFIBRILLATOR IMPLANT N/A 01/06/2012   Procedure: IMPLANTABLE CARDIOVERTER DEFIBRILLATOR IMPLANT;  Surgeon: Deboraha Sprang, MD;  Location: [facility] CATH LAB;  Service: Cardiovascular;  Laterality: N/A;   PACEMAKER IMPLANT  St [name]   TEE WITHOUT CARDIOVERSION N/A 03/30/2013   Procedure: TRANSESOPHAGEAL ECHOCARDIOGRAM (TEE);  Surgeon: Thayer Headings, MD;  Location: [facility];  Service: Cardiovascular;  Laterality: N/A;   V TACH ABLATION  06/10/2017   V TACH ABLATION N/A 06/10/2017   Procedure: Stephanie Coup Ablation;  Surgeon: Evans Lance, MD;  Location: [facility] CV LAB;  Service: Cardiovascular;  Laterality: N/A;    Social History:  reports that he quit smoking about 35 years ago. His smoking use included cigarettes. He has a 100.00 pack-year smoking history. He has never used smokeless  tobacco. He reports that he does not drink alcohol or use drugs.  Allergies:  Allergies  Allergen Reactions   Xarelto [Rivaroxaban] Other (See Comments)    Bleeding   Adhesive [Tape] Hives, Itching and Rash   Atorvastatin Cough   Codeine Nausea Only   Isosorbide Nitrate Other (See Comments)    Made him feel bad   Latex Itching and Other (See Comments)    Rash, itching, burning   Levofloxacin Other (See Comments)    Tachycardia   Lisinopril Cough   Lorazepam Other (See Comments)    "felt bad"   Mexiletine Other (See Comments)    GI distress   Sertraline Other (See Comments)    Made him feel bad    (Not in a hospital admission)    Physical Exam: Blood pressure 109/66, pulse (!) 50, temperature 98.6 F (37 C), temperature source Oral, resp. rate 18, height 6\' 1"  (1.854 m), weight 103 kg, SpO2 91 %. General: pleasant, WD, WN white male who is laying in bed in NAD HEENT: head is normocephalic, atraumatic.  Sclera are noninjected.  PERRL.  Ears and nose without any masses or lesions.  Mouth is pink and moist Heart: Bradycardic, with regular rhythm.  ICD in left upper chest   Palpable radial and pedal pulses bilaterally Lungs: CTAB, on 4L O2. Appears somewhat sob.  Abd: Protuberant but soft, moderate distension, generalized tenderness without peritonitis. Hypoactive bowel sounds. Midline infraumbilical abdominal scar well healed. No masses, hernias, or organomegaly MS: all 4 extremities are symmetrical with no cyanosis, clubbing, or edema. Skin: warm and dry with no masses, lesions, or rashes Neuro: A&O x 3. Moves all extremities.  Psych: Alert with appropriate affect.   Results for orders placed or performed during the hospital encounter of 08/31/19 (from the past 48 hour(s))  CBC with Differential/Platelet     Status: Abnormal   Collection Time: 08/31/19  4:21 AM  Result Value Ref Range   WBC 8.1 4.0 - 10.5 K/uL   RBC 3.91 (L) 4.22 - 5.81 MIL/uL   Hemoglobin  12.8 (L) 13.0 - 17.0 g/dL   HCT 38.1 (L) 39.0 - 52.0 %   MCV 97.4 80.0 - 100.0 fL   MCH 32.7 26.0 - 34.0 pg   MCHC 33.6 30.0 - 36.0 g/dL   RDW 13.8 11.5 - 15.5 %   Platelets 171 150 - 400 K/uL   nRBC 0.0 0.0 - 0.2 %   Neutrophils Relative % 83 %   Neutro Abs 6.8 1.7 - 7.7 K/uL   Lymphocytes Relative 10 %   Lymphs Abs 0.8 0.7 - 4.0 K/uL   Monocytes Relative 6 %   Monocytes Absolute 0.5 0.1 - 1.0 K/uL   Eosinophils Relative 0 %   Eosinophils Absolute 0.0 0.0 - 0.5 K/uL   Basophils Relative 0 %   Basophils Absolute 0.0 0.0 - 0.1 K/uL   Immature Granulocytes 1 %  Abs Immature Granulocytes 0.04 0.00 - 0.07 K/uL    Comment: Performed at [facility], [place] [address] [place], [place] [address]  Comprehensive metabolic panel     Status: Abnormal   Collection Time: 08/31/19  4:21 AM  Result Value Ref Range   Sodium 133 (L) 135 - 145 mmol/L   Potassium 3.4 (L) 3.5 - 5.1 mmol/L   Chloride 94 (L) 98 - 111 mmol/L   CO2 27 22 - 32 mmol/L   Glucose, Bld 126 (H) 70 - 99 mg/dL   BUN 15 8 - 23 mg/dL   Creatinine, Ser 1.62 (H) 0.61 - 1.24 mg/dL   Calcium 8.8 (L) 8.9 - 10.3 mg/dL   Total Protein 6.1 (L) 6.5 - 8.1 g/dL   Albumin 3.5 3.5 - 5.0 g/dL   AST 19 15 - 41 U/L   ALT 22 0 - 44 U/L   Alkaline Phosphatase 50 38 - 126 U/L   Total Bilirubin 1.1 0.3 - 1.2 mg/dL   GFR calc non Af Amer 40 (L) >60 mL/min   GFR calc Af Amer 46 (L) >60 mL/min   Anion gap 12 5 - 15    Comment: Performed at [facility], [place] [address], [place], [place] [address]  Lipase, blood     Status: None   Collection Time: 08/31/19  4:21 AM  Result Value Ref Range   Lipase 17 11 - 51 U/L    Comment: Performed at [facility], [place] [address] [place], [place] [address]  Urinalysis, Routine w reflex microscopic     Status: Abnormal   Collection Time: 08/31/19  4:33 AM  Result Value Ref Range   Color, Urine AMBER (A) YELLOW    Comment: BIOCHEMICALS MAY BE AFFECTED  BY COLOR   APPearance HAZY (A) CLEAR   Specific Gravity, Urine 1.020 1.005 - 1.030   pH 5.0 5.0 - 8.0   Glucose, UA NEGATIVE NEGATIVE mg/dL   Hgb urine dipstick NEGATIVE NEGATIVE   Bilirubin Urine NEGATIVE NEGATIVE   Ketones, ur NEGATIVE NEGATIVE mg/dL   Protein, ur NEGATIVE NEGATIVE mg/dL   Nitrite NEGATIVE NEGATIVE   Leukocytes,Ua NEGATIVE NEGATIVE    Comment: Performed at [facility] [address] [place], [place] [address]   Ct Abdomen Pelvis Wo Contrast  Result Date: 08/31/2019 CLINICAL DATA:  Abdominal distension and acute abdominal pain with nausea since yesterday. EXAM: CT ABDOMEN AND PELVIS WITHOUT CONTRAST TECHNIQUE: Multidetector CT imaging of the abdomen and pelvis was performed following the standard protocol without IV contrast. COMPARISON:  CT scan 01/26/2018 FINDINGS: Lower chest: Small left pleural effusion noted with overlying atelectasis. Significant underlying emphysematous changes. Mild eventration right hemidiaphragm with overlying atelectasis. The heart is mildly enlarged. Coronary artery and aortic calcifications are noted. Pacer wires are noted. Hepatobiliary: No focal hepatic lesions are identified without contrast. No intrahepatic biliary dilatation. Layering high attenuation material in the gallbladder could be sludge or stones. No common bile duct dilatation. Pancreas: Prominent fatty interstices but no mass, inflammation or ductal dilatation. Small duodenum diverticulum noted near the pancreatic head. Spleen: Normal size.  No focal lesions. Adrenals/Urinary Tract: The adrenal glands are normal. Small renal cysts but no worrisome renal lesions are identified without contrast. No renal or obstructing ureteral calculi. The bladder is unremarkable. Stomach/Bowel: The stomach is mildly distended with contrast. The duodenum is unremarkable. Proximal loops of jejunum are normal. The mid jejunal loops are dilated and demonstrate air-fluid levels. There is a  transition to normal/decompressed mid distal small bowel consistent with a small bowel obstruction. This is likely due to adhesions as no obstructing mass is identified. The terminal ileum is normal. The colon is relatively decompressed. There is diffuse colonic diverticulosis but no findings for acute diverticulitis and no mass. The appendix is surgically absent. Vascular/Lymphatic: Advanced atherosclerotic calcifications involving the aorta and branch vessels. There is a focal infrarenal abdominal aortic aneurysm measuring 3.1 cm just above the iliac artery bifurcation. This is stable. Fusiform ectasia and calcification of the iliac arteries. No mesenteric or retroperitoneal mass or adenopathy. Reproductive: The prostate gland and seminal vesicles are unremarkable. Other: No ascites or abdominal wall hernia. No pelvic mass or pelvic adenopathy. No inguinal mass or adenopathy. Musculoskeletal: No significant bony findings. IMPRESSION: CT findings consistent with a mid small bowel obstruction, likely due to adhesions. No abdominal mass, adenopathy or evidence of internal hernia. Advanced atherosclerotic calcifications with a 3.1 cm infrarenal abdominal aortic aneurysm, unchanged. Small left pleural effusion and bibasilar atelectasis. Electronically Signed   By: Marijo Sanes M.D.   On: 08/31/2019 08:35    Anti-infectives (From admission, onward)   None     Assessment/Plan HTN HLD COPD on home O2 CHF NICM CAD ICD A. Fib on Elqius - please hold Elqiuis incase patients needs surgery during admission. Okay for heparin - above per TRH -   SBO - SBO protocol - NGT. Bowel rest - Keep K >4 and MG >2 for bowel function - Ambulate for bowel function  FEN - NPO VTE - SCDs, okay for heparin ID - None   Jillyn Ledger, [facility] 08/31/2019, 11:16 AM Pager: [phone]

## 2019-08-31 NOTE — ED Notes (Signed)
Pt off floor at CT

## 2019-08-31 NOTE — ED Notes (Signed)
Urine culture sent to lab with UA.

## 2019-08-31 NOTE — ED Notes (Signed)
Provider approved son to accompany pt at bedside

## 2019-08-31 NOTE — Progress Notes (Signed)
ICU nurse inserted NG tube at 1815. DG abd 1 view ordered to verify NG tube replacement. Waiting for result back.

## 2019-08-31 NOTE — ED Notes (Signed)
Called [facility] to give report no answer at number provided [phone]

## 2019-08-31 NOTE — ED Provider Notes (Signed)
  Physical Exam  BP 111/67   Pulse 66   Temp 98.6 F (37 C) (Oral)   Resp 16   Ht 6\' 1"  (1.854 m)   Wt 103 kg   SpO2 95%   BMI 29.95 kg/m   Physical Exam  ED Course/Procedures     Procedures  MDM  Received care of pt at 730AM from Dr. Betsey Holiday. Please see his note for prior care. Briefly this is 78yo male who presented with abd pain. CT pending.  CT shows SBO. Consulted hospitalist for admission, surgery for consult. NG tube placed and pain medications given.       Gareth Morgan, MD 08/31/19 2257

## 2019-08-31 NOTE — Progress Notes (Signed)
RN called IR for helping with NG tube insertion but no one could do it now. Charge nurse called ICU and waiting for RN come to help with another attempt to insert NG tube for patient.

## 2019-08-31 NOTE — ED Notes (Signed)
Pt assigned to telemetry per hospitalist. Waiting for bed request

## 2019-08-31 NOTE — Progress Notes (Signed)
NG tube advanced 5 cm to a total of 51 1/2 cm remaining out. Gastro Graffin administered at 2030 and NG tube clamped. RN notified CT dept.

## 2019-08-31 NOTE — Progress Notes (Signed)
ANTICOAGULATION CONSULT NOTE - Initial Consult  Pharmacy Consult for IV heparin Indication: atrial fibrillation (while Apixaban on hold)  Allergies  Allergen Reactions  . Xarelto [Rivaroxaban] Other (See Comments)    Bleeding  . Adhesive [Tape] Hives, Itching and Rash  . Atorvastatin Cough  . Codeine Nausea Only  . Isosorbide Nitrate Other (See Comments)    Made him feel bad  . Latex Itching and Other (See Comments)    Rash, itching, burning  . Levofloxacin Other (See Comments)    Tachycardia  . Lisinopril Cough  . Lorazepam Other (See Comments)    "felt bad"  . Mexiletine Other (See Comments)    GI distress  . Sertraline Other (See Comments)    Made him feel bad    Patient Measurements: Height: 6\' 1"  (185.4 cm) Weight: 227 lb (103 kg) IBW/kg (Calculated) : 79.9 Heparin Dosing Weight: 100.8 kg  Vital Signs: Temp: 98.4 F (36.9 C) (10/08 1414) Temp Source: Oral (10/08 1414) BP: 129/73 (10/08 1414) Pulse Rate: 59 (10/08 1414)  Labs: Recent Labs    08/31/19 0421  HGB 12.8*  HCT 38.1*  PLT 171  CREATININE 1.62*    Estimated Creatinine Clearance: 46.6 mL/min (A) (by C-G formula based on SCr of 1.62 mg/dL (H)).   Medical History: Past Medical History:  Diagnosis Date  . A-fib ([place])    a. Noted on 12/2012 interrogation, placed on Apixaban and ultimately stopped NOACs 2/2 personal decision 8/14.  Marland Kitchen AICD (automatic cardioverter/defibrillator) present    Cardiac defibrillator -dual  [employer]  . Arthritis   . Atrial tachycardia-non sustained    a. Noted on 03/2012 interrogation.  . Chronic systolic heart failure (HCC)    EF down to 20 to 25% per echo November 2012  . COPD (chronic obstructive pulmonary disease) ([place])   . Hyperlipidemia   . Hypertension   . ICD (implantable cardiac defibrillator) in place   . NICM (nonischemic cardiomyopathy) ([place])    a. Normal cors 2000. b. Minimal plaque 2013.  . Old myocardial infarction    a. ?Silent MI. b. Large fixed  inferolateral defect c/w with prior infarct, no ischemia. c. Cath in 2000/2013 with only minimal CAD.  Marland Kitchen Presence of permanent cardiac pacemaker   . Pulmonary nodule, right    Last scan in 2010 showing stability; felt to be benign.  Marland Kitchen PVC's (premature ventricular contractions)   . Ventricular tachycardia, polymorphic ([place])    Rx via ICD 12/14    Medications:  PTA Apixaban 5mg  PO BID-last dose reported as 08/30/2019 at 2030  Assessment: 78 y/oF on Apixaban PTA for atrial fibrillation admitted with chief complaint of abdominal pain and nausea, found to have small bowel obstruction. Pharmacy consulted for IV heparin dosing while Apixaban on hold. CBC: Hgb 12.8, Pltc WNL at 171K. Note, recent Apixaban use can falsely elevate heparin levels.   Goal of Therapy:  Heparin level 0.3-0.7 units/ml  APTT 66-102 seconds Monitor platelets by anticoagulation protocol: Yes    Plan:   Baseline PT/INR, aPTT, heparin level  After baseline labs collected, start heparin infusion at 1500 units/hr (no initial bolus due to recent Apixaban)  Anticipate baseline heparin level to be falsely elevated from recent Apixaban. Therefore, will use aPTT for heparin dosing/titration until effects of Apixaban on heparin level have diminished. Then will use heparin levels for dosing.  APTT 8 hours after heparin initiation   Daily CBC, heparin level, aPTT  Monitor closely for s/sx of bleeding   Lindell Spar, PharmD, BCPS  Clinical Pharmacist  08/31/2019,7:15 PM

## 2019-09-01 ENCOUNTER — Inpatient Hospital Stay (HOSPITAL_COMMUNITY): Payer: No Typology Code available for payment source

## 2019-09-01 ENCOUNTER — Encounter: Payer: Self-pay | Admitting: Cardiology

## 2019-09-01 LAB — APTT
aPTT: 106 seconds — ABNORMAL HIGH (ref 24–36)
aPTT: 137 seconds — ABNORMAL HIGH (ref 24–36)
aPTT: 90 seconds — ABNORMAL HIGH (ref 24–36)

## 2019-09-01 LAB — HEPARIN LEVEL (UNFRACTIONATED): Heparin Unfractionated: >2.2 IU/mL — ABNORMAL HIGH (ref 0.30–0.70)

## 2019-09-01 LAB — COMPREHENSIVE METABOLIC PANEL
ALT: 18 U/L (ref 0–44)
AST: 17 U/L (ref 15–41)
Albumin: 2.9 g/dL — ABNORMAL LOW (ref 3.5–5.0)
Alkaline Phosphatase: 48 U/L (ref 38–126)
Anion gap: 11 (ref 5–15)
BUN: 15 mg/dL (ref 8–23)
CO2: 28 mmol/L (ref 22–32)
Calcium: 8.2 mg/dL — ABNORMAL LOW (ref 8.9–10.3)
Chloride: 95 mmol/L — ABNORMAL LOW (ref 98–111)
Creatinine, Ser: 1.19 mg/dL (ref 0.61–1.24)
GFR calc Af Amer: >60 mL/min (ref >60)
GFR calc non Af Amer: 58 mL/min — ABNORMAL LOW (ref >60)
Glucose, Bld: 94 mg/dL (ref 70–99)
Potassium: 3.4 mmol/L — ABNORMAL LOW (ref 3.5–5.1)
Sodium: 134 mmol/L — ABNORMAL LOW (ref 135–145)
Total Bilirubin: 0.9 mg/dL (ref 0.3–1.2)
Total Protein: 5.2 g/dL — ABNORMAL LOW (ref 6.5–8.1)

## 2019-09-01 LAB — CBC
HCT: 36.7 % — ABNORMAL LOW (ref 39.0–52.0)
Hemoglobin: 11.8 g/dL — ABNORMAL LOW (ref 13.0–17.0)
MCH: 32.5 pg (ref 26.0–34.0)
MCHC: 32.2 g/dL (ref 30.0–36.0)
MCV: 101.1 fL — ABNORMAL HIGH (ref 80.0–100.0)
Platelets: 158 10*3/uL (ref 150–400)
RBC: 3.63 MIL/uL — ABNORMAL LOW (ref 4.22–5.81)
RDW: 14.3 % (ref 11.5–15.5)
WBC: 8.8 10*3/uL (ref 4.0–10.5)
nRBC: 0 % (ref 0.0–0.2)

## 2019-09-01 LAB — MAGNESIUM: Magnesium: 2.3 mg/dL (ref 1.7–2.4)

## 2019-09-01 MED ORDER — POTASSIUM CHLORIDE CRYS ER 20 MEQ PO TBCR
30.0000 meq | EXTENDED_RELEASE_TABLET | Freq: Two times a day (BID) | ORAL | Status: AC
  Administered 2019-09-01 (×2): 30 meq via ORAL
  Filled 2019-09-01 (×3): qty 1

## 2019-09-01 MED ORDER — LACTATED RINGERS IV SOLN
INTRAVENOUS | Status: DC
  Administered 2019-09-01 – 2019-09-04 (×2): via INTRAVENOUS

## 2019-09-01 MED ORDER — MORPHINE SULFATE (PF) 2 MG/ML IV SOLN
1.0000 mg | INTRAVENOUS | Status: DC | PRN
  Administered 2019-09-02 – 2019-09-03 (×5): 1 mg via INTRAVENOUS
  Filled 2019-09-01 (×5): qty 1

## 2019-09-01 MED ORDER — FINASTERIDE 5 MG PO TABS
5.0000 mg | ORAL_TABLET | Freq: Every day | ORAL | Status: DC
  Administered 2019-09-01 – 2019-09-02 (×2): 5 mg via ORAL
  Filled 2019-09-01 (×2): qty 1

## 2019-09-01 MED ORDER — PROMETHAZINE HCL 25 MG/ML IJ SOLN
12.5000 mg | Freq: Four times a day (QID) | INTRAMUSCULAR | Status: DC | PRN
  Administered 2019-09-02: 13:00:00 12.5 mg via INTRAVENOUS
  Filled 2019-09-01: qty 1

## 2019-09-01 MED ORDER — HEPARIN (PORCINE) 25000 UT/250ML-% IV SOLN
1350.0000 [IU]/h | INTRAVENOUS | Status: DC
  Administered 2019-09-02: 05:00:00 1250 [IU]/h via INTRAVENOUS
  Administered 2019-09-03: 1350 [IU]/h via INTRAVENOUS
  Administered 2019-09-03: 1250 [IU]/h via INTRAVENOUS
  Administered 2019-09-04: 1350 [IU]/h via INTRAVENOUS
  Filled 2019-09-01 (×6): qty 250

## 2019-09-01 NOTE — Progress Notes (Addendum)
Subjective: CC: Abdominal pain Patient reports that his abdomen feels much better this morning. Distension has resolved. He has some minimal pain in his LLQ but otherwise is NT. He reports a few waves of nausea throughout the night but none this AM. He is now passing flatus and had 2 loose BM's this AM. He did not mobilize yesterday.   Objective: Vital signs in last 24 hours: Temp:  [98.4 F (36.9 C)-98.8 F (37.1 C)] 98.8 F (37.1 C) (10/09 0453) Pulse Rate:  [50-69] 69 (10/09 0453) Resp:  [14-18] 16 (10/09 0453) BP: (109-141)/(66-81) 118/69 (10/09 0453) SpO2:  [89 %-97 %] 97 % (10/09 0453) Last BM Date: 08/31/19  Intake/Output from previous day: 10/08 0701 - 10/09 0700 In: 151.7 [I.V.:101.7; IV Piggyback:50] Out: 475 [Urine:475] Intake/Output this shift: No intake/output data recorded.  PE: Gen:  Alert, NAD, pleasant HEENT: NGT in place on LIWS. No output recorded in chart but ~400cc of bile in cannister.  Card:  ICD Left upper chest Pulm:  Normal rate and effort  Abd: Soft, protuberant with mild distension, NT, no peritonitis,+BS, Midline infraumbilical abdominal scar well healed  Lab Results:  Recent Labs    08/31/19 0421 09/01/19 0539  WBC 8.1 8.8  HGB 12.8* 11.8*  HCT 38.1* 36.7*  PLT 171 158   BMET Recent Labs    08/31/19 0421 09/01/19 0539  NA 133* 134*  K 3.4* 3.4*  CL 94* 95*  CO2 27 28  GLUCOSE 126* 94  BUN 15 15  CREATININE 1.62* 1.19  CALCIUM 8.8* 8.2*   PT/INR Recent Labs    08/31/19 1954  LABPROT 14.2  INR 1.1   CMP     Component Value Date/Time   NA 134 (L) 09/01/2019 0539   NA 141 01/06/2018 1456   K 3.4 (L) 09/01/2019 0539   CL 95 (L) 09/01/2019 0539   CO2 28 09/01/2019 0539   GLUCOSE 94 09/01/2019 0539   BUN 15 09/01/2019 0539   BUN 21 01/06/2018 1456   CREATININE 1.19 09/01/2019 0539   CALCIUM 8.2 (L) 09/01/2019 0539   PROT 5.2 (L) 09/01/2019 0539   ALBUMIN 2.9 (L) 09/01/2019 0539   AST 17 09/01/2019 0539   ALT  18 09/01/2019 0539   ALKPHOS 48 09/01/2019 0539   BILITOT 0.9 09/01/2019 0539   GFRNONAA 58 (L) 09/01/2019 0539   GFRAA >60 09/01/2019 0539   Lipase     Component Value Date/Time   LIPASE 17 08/31/2019 0421       Studies/Results: Ct Abdomen Pelvis Wo Contrast  Result Date: 08/31/2019 CLINICAL DATA:  Abdominal distension and acute abdominal pain with nausea since yesterday. EXAM: CT ABDOMEN AND PELVIS WITHOUT CONTRAST TECHNIQUE: Multidetector CT imaging of the abdomen and pelvis was performed following the standard protocol without IV contrast. COMPARISON:  CT scan 01/26/2018 FINDINGS: Lower chest: Small left pleural effusion noted with overlying atelectasis. Significant underlying emphysematous changes. Mild eventration right hemidiaphragm with overlying atelectasis. The heart is mildly enlarged. Coronary artery and aortic calcifications are noted. Pacer wires are noted. Hepatobiliary: No focal hepatic lesions are identified without contrast. No intrahepatic biliary dilatation. Layering high attenuation material in the gallbladder could be sludge or stones. No common bile duct dilatation. Pancreas: Prominent fatty interstices but no mass, inflammation or ductal dilatation. Small duodenum diverticulum noted near the pancreatic head. Spleen: Normal size.  No focal lesions. Adrenals/Urinary Tract: The adrenal glands are normal. Small renal cysts but no worrisome renal lesions are identified without  contrast. No renal or obstructing ureteral calculi. The bladder is unremarkable. Stomach/Bowel: The stomach is mildly distended with contrast. The duodenum is unremarkable. Proximal loops of jejunum are normal. The mid jejunal loops are dilated and demonstrate air-fluid levels. There is a transition to normal/decompressed mid distal small bowel consistent with a small bowel obstruction. This is likely due to adhesions as no obstructing mass is identified. The terminal ileum is normal. The colon is  relatively decompressed. There is diffuse colonic diverticulosis but no findings for acute diverticulitis and no mass. The appendix is surgically absent. Vascular/Lymphatic: Advanced atherosclerotic calcifications involving the aorta and branch vessels. There is a focal infrarenal abdominal aortic aneurysm measuring 3.1 cm just above the iliac artery bifurcation. This is stable. Fusiform ectasia and calcification of the iliac arteries. No mesenteric or retroperitoneal mass or adenopathy. Reproductive: The prostate gland and seminal vesicles are unremarkable. Other: No ascites or abdominal wall hernia. No pelvic mass or pelvic adenopathy. No inguinal mass or adenopathy. Musculoskeletal: No significant bony findings. IMPRESSION: CT findings consistent with a mid small bowel obstruction, likely due to adhesions. No abdominal mass, adenopathy or evidence of internal hernia. Advanced atherosclerotic calcifications with a 3.1 cm infrarenal abdominal aortic aneurysm, unchanged. Small left pleural effusion and bibasilar atelectasis. Electronically Signed   By: Marijo Sanes M.D.   On: 08/31/2019 08:35   Dg Abd 1 View  Result Date: 08/31/2019 CLINICAL DATA:  Nasogastric tube placement. EXAM: ABDOMEN - 1 VIEW COMPARISON:  08/31/2019 earlier. FINDINGS: Patient's nasogastric tube has been straightened out and has tip over the region of the gastric cardia in the left upper quadrant with side-port just above the expected region of the gastroesophageal junction. This could be advanced at least 5 cm. Persistent air-filled dilated small bowel loop over the left upper quadrant. No free peritoneal air. Remainder the exam is unchanged. IMPRESSION: Persistent air-filled dilated small bowel loop over the left upper quadrant. Nasogastric tube is been straight diet with tip projecting over the gastric cardia in the left upper quadrant and side-port just above the expected region of the gastroesophageal junction. This could be advanced  another 5 cm. Electronically Signed   By: Marin Olp M.D.   On: 08/31/2019 19:02   Dg Chest Port 1 View  Result Date: 08/31/2019 CLINICAL DATA:  Small bowel obstruction. EXAM: PORTABLE CHEST 1 VIEW COMPARISON:  Chest x-ray dated July 29, 2019. FINDINGS: Enteric tube entering the stomach with the tip below the field of view. Unchanged left chest wall pacemaker. Stable cardiomegaly. Normal pulmonary vascularity. Unchanged small left pleural effusion. Chronic bibasilar atelectasis/scarring. No pneumothorax. No acute osseous abnormality. IMPRESSION: 1. Unchanged small left pleural effusion. Electronically Signed   By: Titus Dubin M.D.   On: 08/31/2019 15:35   Dg Abd Portable 1v-small Bowel Obstruction Protocol-initial, 8 Hr Delay  Result Date: 09/01/2019 CLINICAL DATA:  Small bowel obstruction protocol EXAM: PORTABLE ABDOMEN - 1 VIEW COMPARISON:  Yesterday FINDINGS: Nasogastric tube tip overlaps the proximal stomach. There is still dilated small bowel in the left abdomen with contrast retention, but contrast has reached the ascending colon. No concerning mass effect or gas collection IMPRESSION: Partial small bowel obstruction with oral contrast reaching the proximal colon. Small bowel distension in the left abdomen is similar to prior. Electronically Signed   By: Monte Fantasia M.D.   On: 09/01/2019 05:08   Dg Abd Portable 1v  Result Date: 08/31/2019 CLINICAL DATA:  Nasogastric tube placement EXAM: PORTABLE ABDOMEN - 1 VIEW COMPARISON:  Portable exam 1548  hours compared to 08/31/2019 at 1200 hours FINDINGS: Nasogastric tube coiled in proximal stomach with tip reflected back into the distal esophagus; recommend withdrawal and replace. Scattered air-filled loops of large and small bowel throughout abdomen. Gaseous distention of stomach. Bones demineralized. IMPRESSION: Nasogastric tube coiled in proximal stomach with tip reflected into the distal esophagus; recommend withdrawal and replacement.  Findings called to [place] on 5E on 08/31/2019 at 1621 hours. Electronically Signed   By: Lavonia Dana M.D.   On: 08/31/2019 16:22   Dg Abd Portable 1v-small Bowel Protocol-position Verification  Result Date: 08/31/2019 CLINICAL DATA:  Reposition NG tube EXAM: PORTABLE ABDOMEN - 1 VIEW COMPARISON:  08/25/2019 FINDINGS: Nasogastric tube coiled in the distal esophagus with the tip redirected cephalad in the mid-distal esophagus. Recommend repositioning prior to use. There is no bowel dilatation to suggest obstruction. There is no evidence of pneumoperitoneum, portal venous gas or pneumatosis. There are no pathologic calcifications along the expected course of the ureters. The osseous structures are unremarkable. IMPRESSION: Nasogastric tube coiled in the distal esophagus with the tip redirected cephalad in the mid-distal esophagus. Recommend repositioning prior to use. Electronically Signed   By: Kathreen Devoid   On: 08/31/2019 12:54   Dg Abd Portable 1 View  Result Date: 08/31/2019 CLINICAL DATA:  NG placement. EXAM: PORTABLE ABDOMEN - 1 VIEW COMPARISON:  None. FINDINGS: The NG tube is kinked back on itself in the distal esophagus and should be advanced several cm where it should unfold in the stomach. Scattered air-filled loops of small bowel and also air and stool throughout the colon. No free air. The bony structures are unremarkable. IMPRESSION: The NG tube is kinked back on itself in the distal esophagus and should be advanced several cm. Electronically Signed   By: Marijo Sanes M.D.   On: 08/31/2019 11:41    Anti-infectives: Anti-infectives (From admission, onward)   None       Assessment/Plan HTN HLD COPD on home O2 CHF NICM CAD ICD A. Fib on Elqius - please hold Elqiuis incase patients needs surgery during admission. Okay for heparin - above per TRH -   SBO - Contrast in colon on xray this AM. He is having bowel function - Remove NGT, start on CLD. Recheck this afternoon  -  Keep K >4 and MG >2 for bowel function - Ambulate for bowel function  FEN - CLD, K 3.4 (replace), Mg 2.3 VTE - SCDs, okay for heparin ID - None      LOS: 1 day    Jillyn Ledger , [facility] 09/01/2019, 9:50 AM Pager: [phone]

## 2019-09-01 NOTE — Progress Notes (Signed)
Remote ICD transmission.   

## 2019-09-01 NOTE — Progress Notes (Signed)
Pt refusing to take Dulera BID and Incruse QD. Pt states he takes Advair BID and Spiriva QD at home. Pt wants to have family bring home medication.

## 2019-09-01 NOTE — Progress Notes (Signed)
[place] for IV heparin Indication: atrial fibrillation (while Apixaban on hold)  Allergies  Allergen Reactions  . Xarelto [Rivaroxaban] Other (See Comments)    Bleeding  . Adhesive [Tape] Hives, Itching and Rash  . Atorvastatin Cough  . Codeine Nausea Only  . Isosorbide Nitrate Other (See Comments)    Made him feel bad  . Latex Itching and Other (See Comments)    Rash, itching, burning  . Levofloxacin Other (See Comments)    Tachycardia  . Lisinopril Cough  . Lorazepam Other (See Comments)    "felt bad"  . Mexiletine Other (See Comments)    GI distress  . Sertraline Other (See Comments)    Made him feel bad   Patient Measurements: Height: 6\' 1"  (185.4 cm) Weight: 227 lb (103 kg) IBW/kg (Calculated) : 79.9 Heparin Dosing Weight: 100.8 kg  Vital Signs: Temp: 98.8 F (37.1 C) (10/09 0453) Temp Source: Oral (10/09 0453) BP: 118/69 (10/09 0453) Pulse Rate: 69 (10/09 0453)  Labs: Recent Labs    08/31/19 0421 08/31/19 1954 09/01/19 0539 09/01/19 1359  HGB 12.8*  --  11.8*  --   HCT 38.1*  --  36.7*  --   PLT 171  --  158  --   APTT  --  30 106* 137*  LABPROT  --  14.2  --   --   INR  --  1.1  --   --   HEPARINUNFRC  --  >2.20* >2.20*  --   CREATININE 1.62*  --  1.19  --    Estimated Creatinine Clearance: 63.4 mL/min (by C-G formula based on SCr of 1.19 mg/dL).  Medical History: Past Medical History:  Diagnosis Date  . A-fib ([place])    a. Noted on 12/2012 interrogation, placed on Apixaban and ultimately stopped NOACs 2/2 personal decision 8/14.  Marland Kitchen AICD (automatic cardioverter/defibrillator) present    Cardiac defibrillator -dual  [employer]  . Arthritis   . Atrial tachycardia-non sustained    a. Noted on 03/2012 interrogation.  . Chronic systolic heart failure (HCC)    EF down to 20 to 25% per echo November 2012  . COPD (chronic obstructive pulmonary disease) ([place])   . Hyperlipidemia   . Hypertension   . ICD  (implantable cardiac defibrillator) in place   . NICM (nonischemic cardiomyopathy) ([place])    a. Normal cors 2000. b. Minimal plaque 2013.  . Old myocardial infarction    a. ?Silent MI. b. Large fixed inferolateral defect c/w with prior infarct, no ischemia. c. Cath in 2000/2013 with only minimal CAD.  Marland Kitchen Presence of permanent cardiac pacemaker   . Pulmonary nodule, right    Last scan in 2010 showing stability; felt to be benign.  Marland Kitchen PVC's (premature ventricular contractions)   . Ventricular tachycardia, polymorphic ([place])    Rx via ICD 12/14    Medications:  PTA Apixaban 5mg  PO BID-last dose reported as 08/30/2019 at 2030  Assessment: 78 y/oF on Apixaban PTA for atrial fibrillation admitted with chief complaint of abdominal pain and nausea, found to have small bowel obstruction. Pharmacy consulted for IV heparin dosing while Apixaban on hold. CBC: Hgb 12.8, Pltc WNL at 171K. Note, recent Apixaban use can falsely elevate heparin levels.   Heparin started 10/8 at 1500 units/hr with no bolus due to recent Apixaban  Anticipate baseline heparin level to be falsely elevated from recent Apixaban. Therefore, will use aPTT for heparin dosing/titration until effects of Apixaban on heparin level have  diminished. Then will use heparin levels for dosing.  Today, 09/01/2019 0539 aPTT was 106 - at high end of desired range, Heparin rate decreased to 1450 units/hr 1400 aPTT increased further to 137 sec on Heparin 1450 units/hr  Goal of Therapy:  Heparin level 0.3-0.7 units/ml  APTT 66-102 seconds Monitor platelets by anticoagulation protocol: Yes    Plan:   Reduce Heparin rate to 1250 units/hr  Recheck aPTT at 2200  Daily CBC, heparin level, no daily aPTT yet  Monitor closely for s/sx of bleeding  Minda Ditto PharmD Pager [phone] 09/01/2019, 2:52 PM

## 2019-09-01 NOTE — Evaluation (Signed)
Occupational Therapy Evaluation Patient Details Name: Corey Tyler MRN: [id number] DOB: Jan 05, 1941 Today's Date: 09/01/2019    History of Present Illness 78 yo male admitted to ED on 10/8 with abdominal pain, and nausea, found to have SBO and had NGT placed (removed during OT eval 09/01/19). PMHx:  CHF, cardiomyopathy, COPD, HTN, HLD, CAD, MI, AICD, R pulm nodule, atherosclerosis, a-fib with mult ablations, TEE.   Clinical Impression   Pt seen for OT evaluation this date. Prior to hospital admission, pt was mostly Indep with BADLs with just setup and supervision for bathing as needed and assist from spouse for household IADLs such as cooking, cleaning, and transportation.  Pt lives in one level apartment in high-rise with elevator.  Currently pt demonstrates impairments in fxl activity tolerance and decreased standing balance as well as decreased safety awareness with use of RW requiring MOD-MAX A for most LB ADLs and MIN-MOD A for most UB ADLs at time of OT eval. Pt is able to perform 4 sit<>stand to/from recliner during fxl self care tasks with MIN A with FWW and MOD verbal cues for safe hand placement. Pt would benefit from skilled OT to address noted impairments and functional limitations (see below for any additional details) in order to maximize safety and independence while minimizing falls risk and caregiver burden.  Upon hospital discharge, recommend pt discharge to home with [place].    Follow Up Recommendations  Home health [facility];3 in 1 bedside commode(Pt and spouse attempting to obtain hospital bed from [place] which would be beneficial given pt's positioning needs during sleep secondary to becoming SOB (most recently sleeping in recliner at home per spouse report).)    Recommendations for Other Services       Precautions / Restrictions Precautions Precautions: Fall;ICD/Pacemaker Restrictions Weight Bearing Restrictions: No      Mobility Bed  Mobility Overal bed mobility: Needs Assistance             General bed mobility comments: pt seated in arm chair throughout OT eval/treat, defer bed mobility assessment at this time.  Transfers Overall transfer level: Needs assistance Equipment used: Rolling walker (2 wheeled) Transfers: Sit to/from Stand Sit to Stand: Min assist         General transfer comment: Min assist for steadying, pt able to power up without assist with increased time.    Balance Overall balance assessment: Needs assistance Sitting-balance support: No upper extremity supported;Feet supported Sitting balance-Leahy Scale: Good     Standing balance support: Bilateral upper extremity supported Standing balance-Leahy Scale: Fair Standing balance comment: Moderate use of FWW for UE support to steady self in static standing.                           ADL either performed or assessed with clinical judgement   ADL Overall ADL's : Needs assistance/impaired Eating/Feeding: Set up   Grooming: Wash/dry hands;Wash/dry face;Oral care;Supervision/safety;Set up;Minimal assistance   Upper Body Bathing: Supervision/ safety;Set up;Minimal assistance;Sitting   Lower Body Bathing: Sit to/from stand;Maximal assistance;Supervison/ safety Lower Body Bathing Details (indicate cue type and reason): OT provides standing balance assistance with MIN A with FWW while wife completes posterior LB bathing, seated in chair, pt able to perform anterior LB bathing. Upper Body Dressing : Minimal assistance;Sitting   Lower Body Dressing: Minimal assistance;Moderate assistance;Sit to/from stand Lower Body Dressing Details (indicate cue type and reason): pt able to don/doff socks while seated using  cross leg motdified tehcnique, to manage underwear for sit<>stand, requires MIN MOD for standing balance and mgt over hips. Toilet Transfer: Minimal assistance;Moderate assistance;RW;BSC Toilet Transfer Details (indicate cue type  and reason): based on clinical observation of 4x sit<>stand from recliner. Toileting- Clothing Manipulation and Hygiene: Minimal assistance;Moderate assistance;Sit to/from stand   Tub/ Shower Transfer: Supervision/safety;Minimal assistance;Moderate assistance   Functional mobility during ADLs: Minimal assistance;Moderate assistance;Rolling walker(performed 5x MIP bilaterally in front of recliner)       Vision Baseline Vision/History: Wears glasses Wears Glasses: Reading only Patient Visual Report: No change from baseline       Perception     Praxis      Pertinent Vitals/Pain Pain Assessment: Faces Faces Pain Scale: No hurt     Hand Dominance Right   Extremity/Trunk Assessment Upper Extremity Assessment Upper Extremity Assessment: Generalized weakness;RUE deficits/detail;LUE deficits/detail RUE Deficits / Details: shoulder/elbow flex/ext 4/5, grip 5/5, some increased time for fine motor tasks, wife reports recent episode of UE shaking for several hours at home and potential Parkinson's workup after this hospitalization. LUE Deficits / Details: ""   Lower Extremity Assessment Lower Extremity Assessment: Defer to PT evaluation;Generalized weakness   Cervical / Trunk Assessment Cervical / Trunk Assessment: Normal   Communication Communication Communication: No difficulties   Cognition Arousal/Alertness: Awake/alert Behavior During Therapy: WFL for tasks assessed/performed Overall Cognitive Status: Within Functional Limits for tasks assessed                                 General Comments: Pt initially with minimal eye opening on evaluation. Pt requires continual tactile/verbal cues to open eyes with self care tasks for safety and more active participation. Gradually becomes more alert/participatory throughout evaluation/treatment.   General Comments       Exercises Other Exercises Other Exercises: OT facilitates education with pt and spouse re: role of  OT in acute setting. Verbalized understanding. Other Exercises: OT facilitates education with pt and spouse re: safe use of FWW for ADL transfers including safe hand/foot placement (push from chair, reach back prior to sitting), pt requires MOD verbal cues to reach back prior to sit and demos 50% success following this cue with ADL transfers.   Shoulder Instructions      Home Living Family/patient expects to be discharged to:: Private residence Living Arrangements: Spouse/significant other Available Help at Discharge: Family;Available 24 hours/day Type of Home: Apartment Home Access: Elevator     Home Layout: One level     Bathroom Shower/Tub: Occupational psychologist: Standard     Home Equipment: Clinical cytogeneticist - 4 wheels;Wheelchair - manual          Prior Functioning/Environment Level of Independence: Needs assistance  Gait / Transfers Assistance Needed: Pt uses 4WW at all times for fxl mobility in home and community, occasionally will use w/c for further distances. ADL's / Homemaking Assistance Needed: Pt's wife assists for setup and supervision for bathing d/t pt's O2 demands. Wife drives pt and primarily performs household IADLs such as cooking/cleaning.            OT Problem List: Decreased strength;Decreased activity tolerance;Impaired balance (sitting and/or standing);Decreased coordination;Decreased safety awareness;Decreased knowledge of use of DME or AE      OT Treatment/Interventions:      OT Goals(Current goals can be found in the care plan section) Acute Rehab OT Goals Patient Stated Goal: To get home OT Goal Formulation: With patient/family Time  For Goal Achievement: 09/15/19 Potential to Achieve Goals: Good  OT Frequency:     Barriers to D/C:            Co-evaluation              AM-PAC OT "6 Clicks" Daily Activity     Outcome Measure Help from another person eating meals?: None Help from another person taking care of personal  grooming?: A Little Help from another person toileting, which includes using toliet, bedpan, or urinal?: A Lot Help from another person bathing (including washing, rinsing, drying)?: A Lot Help from another person to put on and taking off regular upper body clothing?: A Little Help from another person to put on and taking off regular lower body clothing?: A Lot 6 Click Score: 16   End of Session Equipment Utilized During Treatment: Gait belt;Rolling walker Nurse Communication: (OT spoke with nursing student present at end of session re: pt's t/f status.)  Activity Tolerance: Patient tolerated treatment well;Patient limited by fatigue Patient left: in chair;with nursing/sitter in room;with family/visitor present;with call bell/phone within reach;with chair alarm set(nursing student taking VS)  OT Visit Diagnosis: Unsteadiness on feet (R26.81);Muscle weakness (generalized) (M62.81)                Time: [id number] OT Time Calculation (min): 53 min Charges:  OT General Charges $OT Visit: 1 Visit OT Evaluation $OT Eval Moderate Complexity: 1 Mod OT Treatments $Self Care/Home Management : 23-37 mins $Therapeutic Activity: 8-22 mins  Gerrianne Scale, MS, OTR/L Pager # [phone] 09/01/19, 3:31 PM

## 2019-09-01 NOTE — Evaluation (Signed)
Physical Therapy Evaluation Patient Details Name: Corey Tyler MRN: [id number] DOB: May 05, 1941 Today's Date: 09/01/2019   History of Present Illness  78 yo male admitted to ED on 10/8 with abdominal pain, and nausea, found to have SBO and has NGT placed. PMHx:  CHF, cardiomyopathy, COPD, HTN, HLD, CAD, MI, AICD, R pulm nodule, atherosclerosis, a-fib with mult ablations, TEE.  Clinical Impression   Pt presents with weakness, impaired cardiopulmonary status, increased time and effort to mobilize, unsteadiness in standing, and decreased activity tolerance. Pt to benefit from acute PT to address deficits. Pt ambulated hallway distance with RW with min guard assist, pt with O2 desaturation to 84% requiring VC for breathing technique and rest to recover. PT recommending HHPT to address mobility deficits, may progress to point with mobility where no follow up is warranted. PT to progress mobility as tolerated, and will continue to follow acutely.      Follow Up Recommendations Home health PT;Supervision for mobility/OOB    Equipment Recommendations  None recommended by PT(pt and wife going through [place] for toilet riser, hospital bed)    Recommendations for Other Services       Precautions / Restrictions Precautions Precautions: Fall;ICD/Pacemaker Restrictions Weight Bearing Restrictions: No      Mobility  Bed Mobility Overal bed mobility: Needs Assistance             General bed mobility comments: pt sitting EOB upon PT arrival to room, to recliner post-ambulation.  Transfers Overall transfer level: Needs assistance Equipment used: Rolling walker (2 wheeled) Transfers: Sit to/from Stand Sit to Stand: Min assist         General transfer comment: Min assist for steadying, pt able to power up without assist with increased time.  Ambulation/Gait Ambulation/Gait assistance: Min guard;+2 safety/equipment(lines) Gait Distance (Feet): 120 Feet Assistive device: Rolling walker (2  wheeled) Gait Pattern/deviations: Step-through pattern;Decreased stride length;Trunk flexed Gait velocity: decr   General Gait Details: Min guard for safety, pt with forward flexed posture and outside RW. PT cuing for placement in RW and pt states "I don't do that". Pt unsteady with RW use and moves at various speeds without warning. Pt on 6LO2 via [place] with ambulation, immediately post-ambulation pt with SpO2 of 84% requiring PT cuing for breathing technique. Recovered to 93%, placed on 4L when recovered.  Stairs            Wheelchair Mobility    Modified Rankin (Stroke Patients Only)       Balance Overall balance assessment: Needs assistance Sitting-balance support: No upper extremity supported;Feet supported Sitting balance-Leahy Scale: Good     Standing balance support: Bilateral upper extremity supported Standing balance-Leahy Scale: Fair Standing balance comment: quite unsteady without UE support                             Pertinent Vitals/Pain Pain Assessment: No/denies pain    Home Living Family/patient expects to be discharged to:: Private residence Living Arrangements: Spouse/significant other Available Help at Discharge: Family;Available 24 hours/day Type of Home: Apartment Home Access: Elevator     Home Layout: One level Home Equipment: Clinical cytogeneticist - 4 wheels;Wheelchair - manual      Prior Function Level of Independence: Needs assistance   Gait / Transfers Assistance Needed: pt ambulates with rollator, occasionally uses wheelchair to "save my oxygen"  ADL's / Homemaking Assistance Needed: Pt's wife assists as needed, pt manages O2 demands  Hand Dominance   Dominant Hand: Right    Extremity/Trunk Assessment   Upper Extremity Assessment Upper Extremity Assessment: Generalized weakness    Lower Extremity Assessment Lower Extremity Assessment: Generalized weakness    Cervical / Trunk Assessment Cervical / Trunk  Assessment: Normal  Communication   Communication: No difficulties  Cognition Arousal/Alertness: Awake/alert Behavior During Therapy: WFL for tasks assessed/performed Overall Cognitive Status: Within Functional Limits for tasks assessed                                        General Comments      Exercises     Assessment/Plan    PT Assessment Patient needs continued PT services  PT Problem List Decreased strength;Decreased mobility;Decreased safety awareness;Decreased activity tolerance;Decreased balance;Decreased knowledge of use of DME;Cardiopulmonary status limiting activity       PT Treatment Interventions DME instruction;Therapeutic activities;Gait training;Therapeutic exercise;Patient/family education;Functional mobility training;Balance training    PT Goals (Current goals can be found in the Care Plan section)  Acute Rehab PT Goals Patient Stated Goal: get NGT out, move better PT Goal Formulation: With patient Time For Goal Achievement: 09/15/19 Potential to Achieve Goals: Good    Frequency Min 3X/week   Barriers to discharge        Co-evaluation               AM-PAC PT "6 Clicks" Mobility  Outcome Measure Help needed turning from your back to your side while in a flat bed without using bedrails?: A Little Help needed moving from lying on your back to sitting on the side of a flat bed without using bedrails?: A Little Help needed moving to and from a bed to a chair (including a wheelchair)?: A Little Help needed standing up from a chair using your arms (e.g., wheelchair or bedside chair)?: A Little Help needed to walk in hospital room?: A Little Help needed climbing 3-5 steps with a railing? : A Little 6 Click Score: 18    End of Session Equipment Utilized During Treatment: Oxygen;Gait belt Activity Tolerance: Patient tolerated treatment well;Patient limited by fatigue Patient left: in chair;with call bell/phone within reach;with bed  alarm set;with nursing/sitter in room(wife to stay in room with pt) Nurse Communication: Mobility status PT Visit Diagnosis: Other abnormalities of gait and mobility (R26.89);Unsteadiness on feet (R26.81)    Time: 1250-1311 PT Time Calculation (min) (ACUTE ONLY): 21 min   Charges:   PT Evaluation $PT Eval Low Complexity: 1 Low          Rabecca Birge Conception Chancy, PT Acute Rehabilitation Services Pager [phone]  Office [phone]   Hydie Langan D Elonda Husky 09/01/2019, 2:43 PM

## 2019-09-01 NOTE — Progress Notes (Addendum)
Hospitalist progress note  Corey Tyler  [id number] DOB: Aug 04, 1941 DOA: 08/31/2019 PCP: [name], MD  Narrative: 36 WM A. fib chads score 5+ Saint [name] ICD recent worsening HFrEF 20% COPD HLD HTN complicated by hypotension AAA CHR hypoxic failure 3 to 6 L/home BPH hypothyroid admitted for tremors 9/5 through 07/30/2019 admit 10/8 progressive abdominal distention pain nausea lack of flatus CT scan = SBO NG tube placed transition to IV heparin meds transition to IV general surgery consulted  Assessment & Plan: SBO some flatus some stool-General surgery feels can pull NG based on contrast going down to large bowel-transition to all oral meds when felt appropriate as per surgery-essentially once including amiodarone Flomax finasteride resumed-switch fentanyl to morphine every 4 as needed for pain control  A. fib [place] score 5 on amiodarone + Saint [name] pacer-continue metoprolol IV 2.5 every 6 as needed pressure >170 if able to give amiodarone 200 give the same-magnesium orally on hold, level okay 2.3-continue heparin today transition back to Eliquis 5 twice daily once reliably taking orals and no nausea vomiting  Recent HFrEF from 35% to 20% echo 08/18/2019-metoprolol as above holding 25 twice daily, hold Demadex 20-30 hold Ranexa 1000 twice daily  Chronic hypoxic respiratory failure 2/2 COPD up to 6 L baseline Spiriva 2 puffs every morning reordered, reordered, Flonase, albuterol  Hypertension complicated by therapy for CHF with hypotension-continue nasal oxygen  AKI on admission-15/1.6-->15/1.1 monitor trends in a.m. continue LR at 50 cc an hour  Hypokalemia replaced with 2 doses of oral K-monitor trends and placing in IV fluid if needed further  Probable dilutional anemia secondary to fluids-monitor trends no overt bleeding  AAA-outpatient follow-up  BPH holding Proscar 5, Flomax 0.4 resumed when able to take p.o.  Hypothyroid continue Synthroid IV 66 mg  DVT prophylaxis: heparin    Code Status:   Presumed full   Family Communication:   None present at bedside Disposition Plan: Inpatient  Consultants:   General surgery Procedures:   NG tube Antimicrobials:   None Subjective: Sleepy awake no distress passing several stools and some flatus No chest pain no fever No nausea right now but had some last night Objective: Vitals:   08/31/19 1414 08/31/19 1800 08/31/19 2130 09/01/19 0453  BP: 129/73  111/67 118/69  Pulse: (!) 59  66 69  Resp: 14  16 16   Temp: 98.4 F (36.9 C)  98.6 F (37 C) 98.8 F (37.1 C)  TempSrc: Oral  Oral Oral  SpO2: 92% 90% 95% 97%  Weight:      Height:        Intake/Output Summary (Last 24 hours) at 09/01/2019 0741 Last data filed at 09/01/2019 0300 Gross per 24 hour  Intake 151.74 ml  Output 475 ml  Net -323.26 ml   Filed Weights   08/31/19 0447  Weight: 103 kg    Examination: EOMI NCAT no focal deficit no icterus no pallor looks uncomfortable with NG tube in place Abdomen obese slight tender no rebound S1-S2 A. fib rate controlled No lower extremity edema No rash Neurologically intact however a little bit sleepy but arousable power 5/5 move all 4 limbs equally  Data Reviewed: I have personally reviewed following labs and imaging studies CBC: Recent Labs  Lab 08/31/19 0421 09/01/19 0539  WBC 8.1 8.8  NEUTROABS 6.8  --   HGB 12.8* 11.8*  HCT 38.1* 36.7*  MCV 97.4 101.1*  PLT 171 0000000   Basic Metabolic Panel: Recent Labs  Lab 08/31/19 0421 09/01/19 0539  NA 133* 134*  K 3.4* 3.4*  CL 94* 95*  CO2 27 28  GLUCOSE 126* 94  BUN 15 15  CREATININE 1.62* 1.19  CALCIUM 8.8* 8.2*  MG  --  2.3   GFR: Estimated Creatinine Clearance: 63.4 mL/min (by C-G formula based on SCr of 1.19 mg/dL). Liver Function Tests: Recent Labs  Lab 08/31/19 0421 09/01/19 0539  AST 19 17  ALT 22 18  ALKPHOS 50 48  BILITOT 1.1 0.9  PROT 6.1* 5.2*  ALBUMIN 3.5 2.9*   Recent Labs  Lab 08/31/19 0421  LIPASE 17   No  results for input(s): AMMONIA in the last 168 hours. Coagulation Profile: Recent Labs  Lab 08/31/19 1954  INR 1.1   Cardiac Enzymes: Radiology Studies: Reviewed images personally in health database  Scheduled Meds: . amiodarone  200 mg Oral Daily  . finasteride  5 mg Oral Daily  . levothyroxine  66 mcg Intravenous Daily  . mometasone-formoterol  2 puff Inhalation BID  . potassium chloride  30 mEq Oral BID  . sodium chloride flush  3 mL Intravenous Q12H  . tamsulosin  0.4 mg Oral QHS  . umeclidinium bromide  1 puff Inhalation Daily   Continuous Infusions: . famotidine (PEPCID) IV 20 mg (08/31/19 1414)  . heparin 1,450 Units/hr (09/01/19 0656)    LOS: 1 day   Time spent: [place], MD [facility]  If 7PM-7AM, please contact night-coverage-look on AMION to find my number otherwise-prefer pages-not epic chat,please 09/01/2019, 7:41 AM

## 2019-09-01 NOTE — Progress Notes (Signed)
ANTICOAGULATION CONSULT NOTE - Follow Up Consult  Pharmacy Consult for Heparin Indication: atrial fibrillation (while Apixaban on hold)  Allergies  Allergen Reactions  . Xarelto [Rivaroxaban] Other (See Comments)    Bleeding  . Adhesive [Tape] Hives, Itching and Rash  . Atorvastatin Cough  . Codeine Nausea Only  . Isosorbide Nitrate Other (See Comments)    Made him feel bad  . Latex Itching and Other (See Comments)    Rash, itching, burning  . Levofloxacin Other (See Comments)    Tachycardia  . Lisinopril Cough  . Lorazepam Other (See Comments)    "felt bad"  . Mexiletine Other (See Comments)    GI distress  . Sertraline Other (See Comments)    Made him feel bad    Patient Measurements: Height: 6\' 1"  (185.4 cm) Weight: 227 lb (103 kg) IBW/kg (Calculated) : 79.9 Heparin Dosing Weight:   Vital Signs: Temp: 98.8 F (37.1 C) (10/09 0453) Temp Source: Oral (10/09 0453) BP: 118/69 (10/09 0453) Pulse Rate: 69 (10/09 0453)  Labs: Recent Labs    08/31/19 0421 08/31/19 1954 09/01/19 0539  HGB 12.8*  --  11.8*  HCT 38.1*  --  36.7*  PLT 171  --  158  APTT  --  30 106*  LABPROT  --  14.2  --   INR  --  1.1  --   HEPARINUNFRC  --  >2.20* >2.20*  CREATININE 1.62*  --  1.19    Estimated Creatinine Clearance: 63.4 mL/min (by C-G formula based on SCr of 1.19 mg/dL).   Medications:  Infusions:  . famotidine (PEPCID) IV 20 mg (08/31/19 1414)  . heparin 1,500 Units/hr (08/31/19 2024)    Assessment: Patient with PTT at goal but highest level of goal.  No heparin issues per RN.  PTT ordered with Heparin level until both correlate due to possible drug-lab interaction between oral anticoagulant (rivaroxaban, edoxaban, or apixaban) and anti-Xa level (aka heparin level)   Goal of Therapy:  INR 2-3 aPTT 66-102 seconds Monitor platelets by anticoagulation protocol: Yes   Plan:  Decrease heparin to 1450 units/hr Recheck PTT at 1400  [address] [place]  [place] 09/01/2019,6:54 AM

## 2019-09-02 ENCOUNTER — Inpatient Hospital Stay (HOSPITAL_COMMUNITY): Payer: No Typology Code available for payment source

## 2019-09-02 DIAGNOSIS — N1831 Chronic kidney disease, stage 3a: Secondary | ICD-10-CM | POA: Diagnosis present

## 2019-09-02 LAB — BASIC METABOLIC PANEL
Anion gap: 9 (ref 5–15)
BUN: 17 mg/dL (ref 8–23)
CO2: 28 mmol/L (ref 22–32)
Calcium: 8.2 mg/dL — ABNORMAL LOW (ref 8.9–10.3)
Chloride: 97 mmol/L — ABNORMAL LOW (ref 98–111)
Creatinine, Ser: 1.37 mg/dL — ABNORMAL HIGH (ref 0.61–1.24)
GFR calc Af Amer: 56 mL/min — ABNORMAL LOW (ref >60)
GFR calc non Af Amer: 49 mL/min — ABNORMAL LOW (ref >60)
Glucose, Bld: 76 mg/dL (ref 70–99)
Potassium: 3.9 mmol/L (ref 3.5–5.1)
Sodium: 134 mmol/L — ABNORMAL LOW (ref 135–145)

## 2019-09-02 LAB — CBC
HCT: 34.9 % — ABNORMAL LOW (ref 39.0–52.0)
Hemoglobin: 11.3 g/dL — ABNORMAL LOW (ref 13.0–17.0)
MCH: 32.5 pg (ref 26.0–34.0)
MCHC: 32.4 g/dL (ref 30.0–36.0)
MCV: 100.3 fL — ABNORMAL HIGH (ref 80.0–100.0)
Platelets: 149 10*3/uL — ABNORMAL LOW (ref 150–400)
RBC: 3.48 MIL/uL — ABNORMAL LOW (ref 4.22–5.81)
RDW: 14.2 % (ref 11.5–15.5)
WBC: 6.2 10*3/uL (ref 4.0–10.5)
nRBC: 0 % (ref 0.0–0.2)

## 2019-09-02 LAB — HEPARIN LEVEL (UNFRACTIONATED)
Heparin Unfractionated: 0.87 IU/mL — ABNORMAL HIGH (ref 0.30–0.70)
Heparin Unfractionated: 0.82 IU/mL — ABNORMAL HIGH (ref 0.30–0.70)

## 2019-09-02 LAB — APTT
aPTT: 37 seconds — ABNORMAL HIGH (ref 24–36)
aPTT: 71 seconds — ABNORMAL HIGH (ref 24–36)

## 2019-09-02 LAB — MAGNESIUM: Magnesium: 2.4 mg/dL (ref 1.7–2.4)

## 2019-09-02 NOTE — Progress Notes (Signed)
[place] for IV heparin Indication: atrial fibrillation (while Apixaban on hold)  Allergies  Allergen Reactions  . Xarelto [Rivaroxaban] Other (See Comments)    Bleeding  . Adhesive [Tape] Hives, Itching and Rash  . Atorvastatin Cough  . Codeine Nausea Only  . Isosorbide Nitrate Other (See Comments)    Made him feel bad  . Latex Itching and Other (See Comments)    Rash, itching, burning  . Levofloxacin Other (See Comments)    Tachycardia  . Lisinopril Cough  . Lorazepam Other (See Comments)    "felt bad"  . Mexiletine Other (See Comments)    GI distress  . Sertraline Other (See Comments)    Made him feel bad   Patient Measurements: Height: 6\' 1"  (185.4 cm) Weight: 227 lb (103 kg) IBW/kg (Calculated) : 79.9 Heparin Dosing Weight: 100.8 kg  Vital Signs: Temp: 97.8 F (36.6 C) (10/10 1301) BP: 130/66 (10/10 1301) Pulse Rate: 70 (10/10 1301)  Labs: Recent Labs    08/31/19 0421  08/31/19 1954 09/01/19 0539  09/01/19 2206 09/02/19 0625 09/02/19 1348  HGB 12.8*  --   --  11.8*  --   --  11.3*  --   HCT 38.1*  --   --  36.7*  --   --  34.9*  --   PLT 171  --   --  158  --   --  149*  --   APTT  --    < > 30 106*   < > 90* 37* 71*  LABPROT  --   --  14.2  --   --   --   --   --   INR  --   --  1.1  --   --   --   --   --   HEPARINUNFRC  --    < > >2.20* >2.20*  --   --  0.87* 0.82*  CREATININE 1.62*  --   --  1.19  --   --  1.37*  --    < > = values in this interval not displayed.   Estimated Creatinine Clearance: 55.1 mL/min (A) (by C-G formula based on SCr of 1.37 mg/dL (H)).  Medications:  PTA Apixaban 5mg  PO BID-last dose reported as 08/30/2019 at 2030  Assessment: 78 y/oF on Apixaban PTA for atrial fibrillation admitted with chief complaint of abdominal pain and nausea, found to have small bowel obstruction. Pharmacy consulted for IV heparin dosing while Apixaban on hold. CBC: Hgb 12.8, Pltc WNL at 171K. Note, recent  Apixaban use can falsely elevate heparin levels.   Heparin started 10/8 at 1500 units/hr with no bolus due to recent Apixaban  Anticipate baseline heparin level to be falsely elevated from recent Apixaban. Therefore, will use aPTT for heparin dosing/titration until effects of Apixaban on heparin level have diminished. Then will use heparin levels for dosing.  Today, 09/02/2019 0625 aPTT was 37- below the desired range of 66 - 102 sec Heparin level was 0.87 - above the goal of 0.3 - 0.7 on 1250 units/hr Does not make sense that aPTT is low and heparin level is high.  D/w RN - IV did infiltrate x 2 and replaced so this may explain low aPTT.  D/w MD - not ready to transition back to home apixaban due to continued abdominal pain.  SCr 1.37, Hg stable at 11.3, PLTC down to 149.  No bleeding per RN and Epic.   1348 Repeat  aPTT is within goal at 71 seconds 1348 repeat heparin level is above goal at 0.82 - will continue to dose based on aPTT for now  Goal of Therapy:  Heparin level 0.3-0.7 units/ml  APTT 66-102 seconds Monitor platelets by anticoagulation protocol: Yes    Plan:   Continue hparin rate at 1250 units/hr  Daily CBC, heparin level, daily aPTT  Monitor closely for s/sx of bleeding  Eudelia Bunch, Pharm.D [phone] 09/02/2019 3:16 PM

## 2019-09-02 NOTE — Progress Notes (Signed)
ANTICOAGULATION CONSULT NOTE - Follow Up Consult  Pharmacy Consult for Heparin Indication: atrial fibrillation (while Apixaban on hold)  Allergies  Allergen Reactions  . Xarelto [Rivaroxaban] Other (See Comments)    Bleeding  . Adhesive [Tape] Hives, Itching and Rash  . Atorvastatin Cough  . Codeine Nausea Only  . Isosorbide Nitrate Other (See Comments)    Made him feel bad  . Latex Itching and Other (See Comments)    Rash, itching, burning  . Levofloxacin Other (See Comments)    Tachycardia  . Lisinopril Cough  . Lorazepam Other (See Comments)    "felt bad"  . Mexiletine Other (See Comments)    GI distress  . Sertraline Other (See Comments)    Made him feel bad    Patient Measurements: Height: 6\' 1"  (185.4 cm) Weight: 227 lb (103 kg) IBW/kg (Calculated) : 79.9 Heparin Dosing Weight:   Vital Signs: Temp: 98.3 F (36.8 C) (10/09 2035) BP: 121/75 (10/09 2035) Pulse Rate: 48 (10/09 2035)  Labs: Recent Labs    08/31/19 0421  08/31/19 1954 09/01/19 0539 09/01/19 1359 09/01/19 2206  HGB 12.8*  --   --  11.8*  --   --   HCT 38.1*  --   --  36.7*  --   --   PLT 171  --   --  158  --   --   APTT  --    < > 30 106* 137* 90*  LABPROT  --   --  14.2  --   --   --   INR  --   --  1.1  --   --   --   HEPARINUNFRC  --   --  >2.20* >2.20*  --   --   CREATININE 1.62*  --   --  1.19  --   --    < > = values in this interval not displayed.    Estimated Creatinine Clearance: 63.4 mL/min (by C-G formula based on SCr of 1.19 mg/dL).   Medications:  Infusions:  . famotidine (PEPCID) IV 20 mg (09/01/19 1239)  . heparin 1,250 Units/hr (09/02/19 0438)  . lactated ringers 50 mL/hr at 09/01/19 [id number]    Assessment: Patient with PTT at goal.  No heparin issues noted. PTT ordered with Heparin level until both correlate due to possible drug-lab interaction between oral anticoagulant (rivaroxaban, edoxaban, or apixaban) and anti-Xa level (aka heparin level)  Goal of Therapy:   Heparin level 0.3-0.7 units/ml aPTT 66-102 seconds Monitor platelets by anticoagulation protocol: Yes   Plan:  Continue heparin drip at current rate Recheck levels with AM labs  Tyler Deis, Shea Stakes [place] 09/02/2019,4:50 AM

## 2019-09-02 NOTE — Progress Notes (Signed)
Hospitalist progress note  Corey Tyler  [id number] DOB: [birth year] DOA: 08/31/2019 PCP: [name], MD  Narrative: 70 WM A. fib chads score 5+ [name] ICD recent worsening HFrEF 20% COPD HLD HTN complicated by hypotension AAA CHR hypoxic failure 3 to 6 L/home BPH hypothyroid admitted for tremors 9/5 through 07/30/2019 admit 10/8 progressive abdominal distention pain nausea lack of flatus CT scan = SBO NG tube placed transition to IV heparin meds transition to IV general surgery consulted  Assessment & Plan: SBO some flatus some stool-diet advanced by Gen surgery Had severe abd pain earlier today-AXR 2vw shows SBO but contrast in colon-held diet and monitor-controlled on IV morphine--if further pain-get CT abd   A. fib [place] score 5 on amiodarone + [name] pacer-continue metoprolol IV 2.5 every 6 as needed pressure >170- give amiodarone 200 po -continue heparin today   Recent HFrEF from 35% to 20% echo 08/18/2019-metoprolol as above holding 25 twice daily, hold Demadex 20-30 hold Ranexa 1000 twice daily  Chronic hypoxic respiratory failure 2/2 COPD up to 6 L baseline Spiriva 2 puffs every morning reordered, reordered, Flonase, albuterol  Hypertension complicated by therapy for CHF with hypotension-continue nasal oxygen  AKI on admission-15/1.6-->15/1.1 monitor trends in a.m. continue LR at 50 cc an hour  Hypokalemia replaced, repeat labs a.m.  Probable dilutional anemia secondary to fluids-stable 11 range  AAA-outpatient follow-up  BPH holding Proscar 5, Flomax 0.4 resumed when able to take p.o.  Hypothyroid continue Synthroid IV 66 mg  DVT prophylaxis: heparin   Code Status:   Presumed full   Family Communication:   None present at bedside Disposition Plan: Inpatient  Consultants:   General surgery Procedures:   NG tube Antimicrobials:   None Subjective: Some severe pain after I saw the patient earlier this morning--across bottom of abdomen radiating across lower  quadrant + nausea no vomiting-given Zofran by nurse this a.m. multiple loose stools in addition Objective: Vitals:   09/01/19 2025 09/01/19 2035 09/02/19 0453 09/02/19 1301  BP: 119/68 121/75 112/69 130/66  Pulse: 68 (!) 48 70 70  Resp: 16 16 16    Temp: 98.3 F (36.8 C) 98.3 F (36.8 C) 98.3 F (36.8 C) 97.8 F (36.6 C)  TempSrc:      SpO2: 90% 90% 91% 96%  Weight:      Height:        Intake/Output Summary (Last 24 hours) at 09/02/2019 1405 Last data filed at 09/01/2019 1500 Gross per 24 hour  Intake 477.31 ml  Output -  Net 477.31 ml   Filed Weights   08/31/19 0447  Weight: 103 kg    Examination: EOMI NCAT some discomfort in left lower quadrant Abdomen tender left lower quadrant S1-S2 rate controlled A. fib Neurologically intact however a little bit sleepy but arousable power 5/5 move all 4 limbs equally  Data Reviewed: I have personally reviewed following labs and imaging studies CBC: Recent Labs  Lab 08/31/19 0421 09/01/19 0539 09/02/19 0625  WBC 8.1 8.8 6.2  NEUTROABS 6.8  --   --   HGB 12.8* 11.8* 11.3*  HCT 38.1* 36.7* 34.9*  MCV 97.4 101.1* 100.3*  PLT 171 158 [id number]*   Basic Metabolic Panel: Recent Labs  Lab 08/31/19 0421 09/01/19 0539 09/02/19 0625  NA 133* 134* 134*  K 3.4* 3.4* 3.9  CL 94* 95* 97*  CO2 27 28 28   GLUCOSE 126* 94 76  BUN 15 15 17   CREATININE 1.62* 1.19 1.37*  CALCIUM 8.8* 8.2* 8.2*  MG  --  2.3 2.4   GFR: Estimated Creatinine Clearance: 55.1 mL/min (A) (by C-G formula based on SCr of 1.37 mg/dL (H)). Liver Function Tests: Recent Labs  Lab 08/31/19 0421 09/01/19 0539  AST 19 17  ALT 22 18  ALKPHOS 50 48  BILITOT 1.1 0.9  PROT 6.1* 5.2*  ALBUMIN 3.5 2.9*   Recent Labs  Lab 08/31/19 0421  LIPASE 17   No results for input(s): AMMONIA in the last 168 hours. Coagulation Profile: Recent Labs  Lab 08/31/19 1954  INR 1.1   Cardiac Enzymes: Radiology Studies: Reviewed images personally in health database   Scheduled Meds: . amiodarone  200 mg Oral Daily  . finasteride  5 mg Oral Daily  . levothyroxine  66 mcg Intravenous Daily  . mometasone-formoterol  2 puff Inhalation BID  . sodium chloride flush  3 mL Intravenous Q12H  . tamsulosin  0.4 mg Oral QHS  . umeclidinium bromide  1 puff Inhalation Daily   Continuous Infusions: . famotidine (PEPCID) IV 20 mg (09/02/19 1236)  . heparin 1,250 Units/hr (09/02/19 0438)  . lactated ringers 50 mL/hr at 09/01/19 0924    LOS: 2 days   Time spent: [place], MD [facility]  If 7PM-7AM, please contact night-coverage-look on AMION to find my number otherwise-prefer pages-not epic chat,please 09/02/2019, 2:05 PM

## 2019-09-02 NOTE — Progress Notes (Signed)
[place] [place] for IV heparin Indication: atrial fibrillation (while Apixaban on hold)  Allergies  Allergen Reactions  . Xarelto [Rivaroxaban] Other (See Comments)    Bleeding  . Adhesive [Tape] Hives, Itching and Rash  . Atorvastatin Cough  . Codeine Nausea Only  . Isosorbide Nitrate Other (See Comments)    Made him feel bad  . Latex Itching and Other (See Comments)    Rash, itching, burning  . Levofloxacin Other (See Comments)    Tachycardia  . Lisinopril Cough  . Lorazepam Other (See Comments)    "felt bad"  . Mexiletine Other (See Comments)    GI distress  . Sertraline Other (See Comments)    Made him feel bad   Patient Measurements: Height: 6\' 1"  (185.4 cm) Weight: 227 lb (103 kg) IBW/kg (Calculated) : 79.9 Heparin Dosing Weight: 100.8 kg  Vital Signs: Temp: 97.8 F (36.6 C) (10/10 1301) BP: 130/66 (10/10 1301) Pulse Rate: 70 (10/10 1301)  Labs: Recent Labs    08/31/19 0421  08/31/19 1954 09/01/19 0539 09/01/19 1359 09/01/19 2206 09/02/19 0625  HGB 12.8*  --   --  11.8*  --   --  11.3*  HCT 38.1*  --   --  36.7*  --   --  34.9*  PLT 171  --   --  158  --   --  149*  APTT  --    < > 30 106* 137* 90* 37*  LABPROT  --   --  14.2  --   --   --   --   INR  --   --  1.1  --   --   --   --   HEPARINUNFRC  --   --  >2.20* >2.20*  --   --  0.87*  CREATININE 1.62*  --   --  1.19  --   --  1.37*   < > = values in this interval not displayed.   Estimated Creatinine Clearance: 55.1 mL/min (A) (by C-G formula based on SCr of 1.37 mg/dL (H)).  Medications:  PTA Apixaban 5mg  PO BID-last dose reported as 08/30/2019 at 2030  Assessment: 78 y/oF on Apixaban PTA for atrial fibrillation admitted with chief complaint of abdominal pain and nausea, found to have small bowel obstruction. Pharmacy consulted for IV heparin dosing while Apixaban on hold. CBC: Hgb 12.8, Pltc WNL at 171K. Note, recent Apixaban use can falsely elevate heparin  levels.   Heparin started 10/8 at 1500 units/hr with no bolus due to recent Apixaban  Anticipate baseline heparin level to be falsely elevated from recent Apixaban. Therefore, will use aPTT for heparin dosing/titration until effects of Apixaban on heparin level have diminished. Then will use heparin levels for dosing.  Today, 09/02/2019 0625 aPTT was 37- below the desired range of 66 - 102 sec Heparin level was 0.87 - above the goal of 0.3 - 0.7 on 1250 units/hr Does not make sense that aPTT is low and heparin level is high.  D/w RN - IV did infiltrate x 2 and replaced so this may explain low aPTT.  D/w MD - not ready to transition back to home apixaban due to continued abdominal pain.  SCr 1.37, Hg stable at 11.3, PLTC down to 149.  No bleeding per RN and Epic.   Goal of Therapy:  Heparin level 0.3-0.7 units/ml  APTT 66-102 seconds Monitor platelets by anticoagulation protocol: Yes    Plan:   Continue hparin rate  at 1250 units/hr  Recheck aPTT and heparin level now   Daily CBC, heparin level, daily aPTT  Monitor closely for s/sx of bleeding   Eudelia Bunch, Pharm.D [phone] 09/02/2019 1:19 PM

## 2019-09-02 NOTE — Progress Notes (Signed)
Subjective: CC: Abdominal pain  Patient still feels somewhat bloated but does note abdominal pain is less.  Not nauseated.  Tolerated full her liquids.  Objective: Vital signs in last 24 hours: Temp:  [98.3 F (36.8 C)-98.6 F (37 C)] 98.3 F (36.8 C) (10/10 0453) Pulse Rate:  [48-71] 70 (10/10 0453) Resp:  [16] 16 (10/10 0453) BP: (108-121)/(68-75) 112/69 (10/10 0453) SpO2:  [90 %-94 %] 91 % (10/10 0453) Last BM Date: 09/01/19  Intake/Output from previous day: 10/09 0701 - 10/10 0700 In: 477.3 [I.V.:427.3; IV Piggyback:50] Out: 300 [Urine:300] Intake/Output this shift: No intake/output data recorded.  PE: General: Pt awake/alert/oriented x4 in no major acute distress.  Tired but not sickly  eyes: PERRL, normal EOM. Sclera nonicteric Neuro: CN II-XII intact w/o focal sensory/motor deficits. Lymph: No head/neck/groin lymphadenopathy Psych:  No delerium/psychosis/paranoia HENT: Normocephalic, Mucus membranes moist.  No thrush Neck: Supple, No tracheal deviation Chest: No pain.  Good respiratory excursion. CV:  Pulses intact.  Regular rhythm MS: Normal AROM mjr joints.  No obvious deformity Abdomen: Soft, Moderately distended.  Mildly tender.  No incarcerated hernias. Ext:  SCDs BLE.  No significant edema.  No cyanosis Skin: No petechiae / purpura   Lab Results:  Recent Labs    09/01/19 0539 09/02/19 0625  WBC 8.8 6.2  HGB 11.8* 11.3*  HCT 36.7* 34.9*  PLT 158 149*   BMET Recent Labs    09/01/19 0539 09/02/19 0625  NA 134* 134*  K 3.4* 3.9  CL 95* 97*  CO2 28 28  GLUCOSE 94 76  BUN 15 17  CREATININE 1.19 1.37*  CALCIUM 8.2* 8.2*   PT/INR Recent Labs    08/31/19 1954  LABPROT 14.2  INR 1.1   CMP     Component Value Date/Time   NA 134 (L) 09/02/2019 0625   NA 141 01/06/2018 1456   K 3.9 09/02/2019 0625   CL 97 (L) 09/02/2019 0625   CO2 28 09/02/2019 0625   GLUCOSE 76 09/02/2019 0625   BUN 17 09/02/2019 0625   BUN 21 01/06/2018  1456   CREATININE 1.37 (H) 09/02/2019 0625   CALCIUM 8.2 (L) 09/02/2019 0625   PROT 5.2 (L) 09/01/2019 0539   ALBUMIN 2.9 (L) 09/01/2019 0539   AST 17 09/01/2019 0539   ALT 18 09/01/2019 0539   ALKPHOS 48 09/01/2019 0539   BILITOT 0.9 09/01/2019 0539   GFRNONAA 49 (L) 09/02/2019 0625   GFRAA 56 (L) 09/02/2019 0625   Lipase     Component Value Date/Time   LIPASE 17 08/31/2019 0421       Studies/Results: Dg Abd 1 View  Result Date: 08/31/2019 CLINICAL DATA:  Nasogastric tube placement. EXAM: ABDOMEN - 1 VIEW COMPARISON:  08/31/2019 earlier. FINDINGS: Patient's nasogastric tube has been straightened out and has tip over the region of the gastric cardia in the left upper quadrant with side-port just above the expected region of the gastroesophageal junction. This could be advanced at least 5 cm. Persistent air-filled dilated small bowel loop over the left upper quadrant. No free peritoneal air. Remainder the exam is unchanged. IMPRESSION: Persistent air-filled dilated small bowel loop over the left upper quadrant. Nasogastric tube is been straight diet with tip projecting over the gastric cardia in the left upper quadrant and side-port just above the expected region of the gastroesophageal junction. This could be advanced another 5 cm. Electronically Signed   By: Marin Olp M.D.   On: 08/31/2019 19:02  Dg Chest Port 1 View  Result Date: 08/31/2019 CLINICAL DATA:  Small bowel obstruction. EXAM: PORTABLE CHEST 1 VIEW COMPARISON:  Chest x-ray dated July 29, 2019. FINDINGS: Enteric tube entering the stomach with the tip below the field of view. Unchanged left chest wall pacemaker. Stable cardiomegaly. Normal pulmonary vascularity. Unchanged small left pleural effusion. Chronic bibasilar atelectasis/scarring. No pneumothorax. No acute osseous abnormality. IMPRESSION: 1. Unchanged small left pleural effusion. Electronically Signed   By: Titus Dubin M.D.   On: 08/31/2019 15:35   Dg Abd  Portable 1v-small Bowel Obstruction Protocol-initial, 8 Hr Delay  Result Date: 09/01/2019 CLINICAL DATA:  Small bowel obstruction protocol EXAM: PORTABLE ABDOMEN - 1 VIEW COMPARISON:  Yesterday FINDINGS: Nasogastric tube tip overlaps the proximal stomach. There is still dilated small bowel in the left abdomen with contrast retention, but contrast has reached the ascending colon. No concerning mass effect or gas collection IMPRESSION: Partial small bowel obstruction with oral contrast reaching the proximal colon. Small bowel distension in the left abdomen is similar to prior. Electronically Signed   By: Monte Fantasia M.D.   On: 09/01/2019 05:08   Dg Abd Portable 1v  Result Date: 08/31/2019 CLINICAL DATA:  Nasogastric tube placement EXAM: PORTABLE ABDOMEN - 1 VIEW COMPARISON:  Portable exam 1548 hours compared to 08/31/2019 at 1200 hours FINDINGS: Nasogastric tube coiled in proximal stomach with tip reflected back into the distal esophagus; recommend withdrawal and replace. Scattered air-filled loops of large and small bowel throughout abdomen. Gaseous distention of stomach. Bones demineralized. IMPRESSION: Nasogastric tube coiled in proximal stomach with tip reflected into the distal esophagus; recommend withdrawal and replacement. Findings called to [place] on 5E on 08/31/2019 at 1621 hours. Electronically Signed   By: Lavonia Dana M.D.   On: 08/31/2019 16:22   Dg Abd Portable 1v-small Bowel Protocol-position Verification  Result Date: 08/31/2019 CLINICAL DATA:  Reposition NG tube EXAM: PORTABLE ABDOMEN - 1 VIEW COMPARISON:  08/25/2019 FINDINGS: Nasogastric tube coiled in the distal esophagus with the tip redirected cephalad in the mid-distal esophagus. Recommend repositioning prior to use. There is no bowel dilatation to suggest obstruction. There is no evidence of pneumoperitoneum, portal venous gas or pneumatosis. There are no pathologic calcifications along the expected course of the ureters. The  osseous structures are unremarkable. IMPRESSION: Nasogastric tube coiled in the distal esophagus with the tip redirected cephalad in the mid-distal esophagus. Recommend repositioning prior to use. Electronically Signed   By: Kathreen Devoid   On: 08/31/2019 12:54   Dg Abd Portable 1 View  Result Date: 08/31/2019 CLINICAL DATA:  NG placement. EXAM: PORTABLE ABDOMEN - 1 VIEW COMPARISON:  None. FINDINGS: The NG tube is kinked back on itself in the distal esophagus and should be advanced several cm where it should unfold in the stomach. Scattered air-filled loops of small bowel and also air and stool throughout the colon. No free air. The bony structures are unremarkable. IMPRESSION: The NG tube is kinked back on itself in the distal esophagus and should be advanced several cm. Electronically Signed   By: Marijo Sanes M.D.   On: 08/31/2019 11:41    Anti-infectives: Anti-infectives (From admission, onward)   None       Assessment/Plan HTN HLD COPD on home O2 CHF NICM CAD ICD A. Fib on Elqius - please hold Elqiuis incase patients needs surgery during admission. Okay for heparin - above per TRH .  If palpitation can tolerate back to solid diet, most likely can restart oral Eliquis anticoagulation  SBO -Slowly improving.  Soft diet as tolerated.  May not totally fly yet but give it a try. - Keep K >4 and MG >2 for bowel function - Ambulate for bowel function  The patient is stable.  There is no evidence of peritonitis, acute abdomen, nor shock.  There is no strong evidence of failure of improvement nor decline with current non-operative management.  There is no need for surgery at the present moment.   We will continue to follow.   FEN -  K 3.4 (replace), Mg 2.3 Mildly elevated creatinine but not oliguric.  Defer to medicine and follow.  Seems appropriately hydrated for now VTE - SCDs, okay for heparin ID - None      LOS: 2 days   Adin Hector, MD, FACS, MASCRS Gastrointestinal  and Minimally Invasive Surgery    [address], [place], [place] [address] [phone] Main / Paging [phone] Fax

## 2019-09-03 LAB — BASIC METABOLIC PANEL
Anion gap: 10 (ref 5–15)
BUN: 14 mg/dL (ref 8–23)
CO2: 25 mmol/L (ref 22–32)
Calcium: 8.2 mg/dL — ABNORMAL LOW (ref 8.9–10.3)
Chloride: 98 mmol/L (ref 98–111)
Creatinine, Ser: 1.22 mg/dL (ref 0.61–1.24)
GFR calc Af Amer: >60 mL/min (ref >60)
GFR calc non Af Amer: 56 mL/min — ABNORMAL LOW (ref >60)
Glucose, Bld: 70 mg/dL (ref 70–99)
Potassium: 4 mmol/L (ref 3.5–5.1)
Sodium: 133 mmol/L — ABNORMAL LOW (ref 135–145)

## 2019-09-03 LAB — HEPARIN LEVEL (UNFRACTIONATED): Heparin Unfractionated: 0.51 IU/mL (ref 0.30–0.70)

## 2019-09-03 LAB — CBC
HCT: 37.8 % — ABNORMAL LOW (ref 39.0–52.0)
Hemoglobin: 12.5 g/dL — ABNORMAL LOW (ref 13.0–17.0)
MCH: 32.6 pg (ref 26.0–34.0)
MCHC: 33.1 g/dL (ref 30.0–36.0)
MCV: 98.7 fL (ref 80.0–100.0)
Platelets: 148 10*3/uL — ABNORMAL LOW (ref 150–400)
RBC: 3.83 MIL/uL — ABNORMAL LOW (ref 4.22–5.81)
RDW: 14.2 % (ref 11.5–15.5)
WBC: 6.9 10*3/uL (ref 4.0–10.5)
nRBC: 0 % (ref 0.0–0.2)

## 2019-09-03 LAB — APTT
aPTT: 61 seconds — ABNORMAL HIGH (ref 24–36)
aPTT: 88 seconds — ABNORMAL HIGH (ref 24–36)

## 2019-09-03 MED ORDER — LACTATED RINGERS IV BOLUS: 1000.0000 mL | Freq: Three times a day (TID) | INTRAVENOUS | Status: DC | PRN

## 2019-09-03 MED ORDER — BISACODYL 10 MG RE SUPP
10.0000 mg | Freq: Every day | RECTAL | Status: DC
  Administered 2019-09-03 – 2019-09-06 (×4): 10 mg via RECTAL
  Filled 2019-09-03 (×6): qty 1

## 2019-09-03 MED ORDER — MAGIC MOUTHWASH
15.0000 mL | Freq: Four times a day (QID) | ORAL | Status: DC | PRN
  Filled 2019-09-03: qty 15

## 2019-09-03 MED ORDER — LIP MEDEX EX OINT
1.0000 "application " | TOPICAL_OINTMENT | Freq: Two times a day (BID) | CUTANEOUS | Status: DC
  Administered 2019-09-03 – 2019-09-08 (×9): 1 via TOPICAL
  Filled 2019-09-03 (×2): qty 7

## 2019-09-03 MED ORDER — FENTANYL CITRATE (PF) 100 MCG/2ML IJ SOLN: 25.0000 ug | INTRAMUSCULAR | Status: DC | PRN

## 2019-09-03 MED ORDER — PROCHLORPERAZINE EDISYLATE 10 MG/2ML IJ SOLN: 5.0000 mg | INTRAMUSCULAR | Status: DC | PRN

## 2019-09-03 MED ORDER — ONDANSETRON HCL 4 MG/2ML IJ SOLN
4.0000 mg | Freq: Four times a day (QID) | INTRAMUSCULAR | Status: DC | PRN
  Administered 2019-09-05: 4 mg via INTRAVENOUS
  Filled 2019-09-03: qty 2

## 2019-09-03 MED ORDER — SODIUM CHLORIDE 0.9 % IV SOLN
8.0000 mg | Freq: Four times a day (QID) | INTRAVENOUS | Status: DC | PRN
  Filled 2019-09-03: qty 4

## 2019-09-03 NOTE — Progress Notes (Signed)
I updated the patient's status to the patient's spouse.  Recommendations were made.  Questions were answered.  She expressed understanding & appreciation.  Desmen Schoffstall C. Cordie Beazley, MD, FACS, MASCRS Gastrointestinal and Minimally Invasive Surgery    [address], [address] [place],  [address] [phone] Main / Paging [phone] Fax    

## 2019-09-03 NOTE — Progress Notes (Signed)
[place] for IV heparin Indication: atrial fibrillation (while Apixaban on hold)  Allergies  Allergen Reactions  . Xarelto [Rivaroxaban] Other (See Comments)    Bleeding  . Adhesive [Tape] Hives, Itching and Rash  . Atorvastatin Cough  . Codeine Nausea Only  . Isosorbide Nitrate Other (See Comments)    Made him feel bad  . Latex Itching and Other (See Comments)    Rash, itching, burning  . Levofloxacin Other (See Comments)    Tachycardia  . Lisinopril Cough  . Lorazepam Other (See Comments)    "felt bad"  . Mexiletine Other (See Comments)    GI distress  . Sertraline Other (See Comments)    Made him feel bad   Patient Measurements: Height: 6\' 1"  (185.4 cm) Weight: 227 lb (103 kg) IBW/kg (Calculated) : 79.9 Heparin Dosing Weight: 100.8 kg  Vital Signs: Temp: 98.1 F (36.7 C) (10/11 1348) Temp Source: Oral (10/11 1348) BP: 123/65 (10/11 1348) Pulse Rate: 69 (10/11 1348)  Labs: Recent Labs    08/31/19 1954  09/01/19 0539  09/02/19 0625 09/02/19 1348 09/03/19 0548 09/03/19 1346  HGB  --    < > 11.8*  --  11.3*  --  12.5*  --   HCT  --   --  36.7*  --  34.9*  --  37.8*  --   PLT  --   --  158  --  149*  --  148*  --   APTT 30  --  106*   < > 37* 71* 61* 88*  LABPROT 14.2  --   --   --   --   --   --   --   INR 1.1  --   --   --   --   --   --   --   HEPARINUNFRC >2.20*  --  >2.20*  --  0.87* 0.82* 0.51  --   CREATININE  --   --  1.19  --  1.37*  --  1.22  --    < > = values in this interval not displayed.   Estimated Creatinine Clearance: 61.9 mL/min (by C-G formula based on SCr of 1.22 mg/dL).  Medications:  PTA Apixaban 5mg  PO BID-last dose reported as 08/30/2019 at 2030  Assessment: 78 y/oF on Apixaban PTA for atrial fibrillation admitted with chief complaint of abdominal pain and nausea, found to have small bowel obstruction. Pharmacy consulted for IV heparin dosing while Apixaban on hold. CBC: Hgb 12.8, Pltc WNL at  171K. Note, recent Apixaban use can falsely elevate heparin levels.   Heparin started 10/8 at 1500 units/hr with no bolus due to recent Apixaban  Anticipate baseline heparin level to be falsely elevated from recent Apixaban. Therefore, will use aPTT for heparin dosing/titration until effects of Apixaban on heparin level have diminished. Then will use heparin levels for dosing.  Today, 09/03/2019  APTT drawn at 1346 = 88 seconds - within the desired range of 66 - 102 sec after rate increased to 1450 units/hr this morning  NPO  CBC: Hg/pltc slightly low but stable   No bleeding or infusion related issues reported by RN .   Goal of Therapy:  Heparin level 0.3-0.7 units/ml  APTT 66-102 seconds Monitor platelets by anticoagulation protocol: Yes   Plan:   continue heparin rate at 1350 units/hr  Daily CBC, heparin level, daily aPTT  Monitor closely for s/sx of bleeding  F/U plans to resume Eliquis once  SBO resolved  Eudelia Bunch, Pharm.D [phone] 09/03/2019 3:01 PM

## 2019-09-03 NOTE — Progress Notes (Signed)
Hospitalist progress note  Corey Tyler  [id number] DOB: 11/15/1941 DOA: 08/31/2019 PCP: [name], MD  Narrative: 63 WM A. fib chads score 5+ [name] ICD recent worsening HFrEF 20% COPD HLD HTN complicated by hypotension AAA CHR hypoxic failure 3 to 6 L/home BPH hypothyroid admitted for tremors 9/5 through 07/30/2019 admit 10/8 progressive abdominal distention pain nausea lack of flatus CT scan = SBO NG tube placed transition to IV heparin meds transition to IV general surgery consulted  Assessment & Plan: SBO-NG tube had to be replaced-almost 1 L out since then-defer to general surgery further planning may require surgery-fentanyl 25-50 every 3 as needed severe pain, Zofran for stress, Compazine every 4 second choice work-trial suppository  A. fib [place] score 5 on amiodarone + [name] pacer-continue metoprolol IV 2.5 every 6 as needed pressure >170-continue amiodarone 200 po -continue heparin until trajectory regarding operative management is clear  Recent HFrEF from 35% to 20% echo 08/18/2019-metoprolol as above holding 25 twice daily, hold Demadex 20-30 hold Ranexa 1000 twice daily  Chronic hypoxic respiratory failure 2/2 COPD up to 6 L baseline Spiriva 2 puffs every morning reordered, reordered, Flonase, albuterol-is stable at this time  Hypertension complicated by therapy for CHF with hypotension-blood pressures are improved to some degree monitoring trends  AKI on admission-15/1.6-->15/1.1-->14/1.22- continue LR at 50 cc an hour  Hypokalemia replaced, repeat labs a.m.  Probable dilutional anemia secondary to fluids-in the 11-12 range and no significant change  AAA-outpatient follow-up  BPH holding Proscar 5--Flomax 0.4 resumed   Hypothyroid continue Synthroid IV 66 mg  DVT prophylaxis: heparin   Code Status:   Presumed full   Family Communication:   None present at bedside Disposition Plan: Inpatient  Consultants:   General surgery Procedures:   NG  tube Antimicrobials:   None Subjective: Multiple concerns from patient and wife about multiple attempts at NG tube placement-patient coherent less pain aware that may need surgery no chest pain no fever no chills  Objective: Vitals:   09/02/19 0453 09/02/19 1301 09/02/19 2056 09/03/19 0617  BP: 112/69 130/66 (!) 145/65 135/63  Pulse: 70 70 88 88  Resp: 16  16 16   Temp: 98.3 F (36.8 C) 97.8 F (36.6 C) 98.1 F (36.7 C) 97.8 F (36.6 C)  TempSrc:   Oral Oral  SpO2: 91% 96% 93% 94%  Weight:      Height:        Intake/Output Summary (Last 24 hours) at 09/03/2019 1219 Last data filed at 09/03/2019 0134 Gross per 24 hour  Intake 60 ml  Output 1000 ml  Net -940 ml   Filed Weights   08/31/19 0447  Weight: 103 kg    Examination: EOMI NCAT NG tube in place on oxygen Abdomen less tender not distended at this time S1-S2 rate controlled A. fib Neurologically intact  power 5/5 move all 4 limbs equally No edema no swelling  Data Reviewed: I have personally reviewed following labs and imaging studies CBC: Recent Labs  Lab 08/31/19 0421 09/01/19 0539 09/02/19 0625 09/03/19 0548  WBC 8.1 8.8 6.2 6.9  NEUTROABS 6.8  --   --   --   HGB 12.8* 11.8* 11.3* 12.5*  HCT 38.1* 36.7* 34.9* 37.8*  MCV 97.4 101.1* 100.3* 98.7  PLT 171 158 149* [id number]*   Basic Metabolic Panel: Recent Labs  Lab 08/31/19 0421 09/01/19 0539 09/02/19 0625 09/03/19 0548  NA 133* 134* 134* 133*  K 3.4* 3.4* 3.9 4.0  CL 94* 95* 97* 98  CO2 27 28 28 25   GLUCOSE 126* 94 76 70  BUN 15 15 17 14   CREATININE 1.62* 1.19 1.37* 1.22  CALCIUM 8.8* 8.2* 8.2* 8.2*  MG  --  2.3 2.4  --    GFR: Estimated Creatinine Clearance: 61.9 mL/min (by C-G formula based on SCr of 1.22 mg/dL). Liver Function Tests: Recent Labs  Lab 08/31/19 0421 09/01/19 0539  AST 19 17  ALT 22 18  ALKPHOS 50 48  BILITOT 1.1 0.9  PROT 6.1* 5.2*  ALBUMIN 3.5 2.9*   Recent Labs  Lab 08/31/19 0421  LIPASE 17   No results  for input(s): AMMONIA in the last 168 hours. Coagulation Profile: Recent Labs  Lab 08/31/19 1954  INR 1.1   Cardiac Enzymes: Radiology Studies: Reviewed images personally in health database  Scheduled Meds: . amiodarone  200 mg Oral Daily  . bisacodyl  10 mg Rectal Daily  . levothyroxine  66 mcg Intravenous Daily  . lip balm  1 application Topical BID  . mometasone-formoterol  2 puff Inhalation BID  . sodium chloride flush  3 mL Intravenous Q12H  . tamsulosin  0.4 mg Oral QHS  . umeclidinium bromide  1 puff Inhalation Daily   Continuous Infusions: . famotidine (PEPCID) IV 20 mg (09/03/19 1143)  . heparin 1,350 Units/hr (09/03/19 0738)  . lactated ringers    . lactated ringers 50 mL/hr at 09/01/19 0924  . ondansetron (ZOFRAN) IV      LOS: 3 days   Time spent: [place], MD [facility]  If 7PM-7AM, please contact night-coverage-look on AMION to find my number otherwise-prefer pages-not epic chat,please 09/03/2019, 12:19 PM

## 2019-09-03 NOTE — Progress Notes (Signed)
Subjective: CC: Abdominal pain  Felt more nauseated and threw up.  Nasogastric tube replaced.  Feels better.  Claims he is having some flatus.  No major bowel movements.  Denies abdominal pain.  Objective: Vital signs in last 24 hours: Temp:  [97.8 F (36.6 C)-98.1 F (36.7 C)] 97.8 F (36.6 C) (10/11 0617) Pulse Rate:  [70-88] 88 (10/11 0617) Resp:  [16] 16 (10/11 0617) BP: (130-145)/(63-66) 135/63 (10/11 0617) SpO2:  [93 %-96 %] 94 % (10/11 0617) Last BM Date: 09/01/19  Intake/Output from previous day: 10/10 0701 - 10/11 0700 In: 60 [NG/GT:60] Out: 1000 [Emesis/NG output:1000] Intake/Output this shift: No intake/output data recorded.  PE: General: Pt awake/alert/oriented x4 in no major acute distress.  Tired but not sickly  eyes: PERRL, normal EOM. Sclera nonicteric Neuro: CN II-XII intact w/o focal sensory/motor deficits. Lymph: No head/neck/groin lymphadenopathy Psych:  No delerium/psychosis/paranoia HENT: Normocephalic, Mucus membranes moist.  No thrush Neck: Supple, No tracheal deviation Chest: No pain.  Good respiratory excursion. CV:  Pulses intact.  Regular rhythm MS: Normal AROM mjr joints.  No obvious deformity Abdomen: Soft, Moderately distended.  Nontender.   No incarcerated hernias. Ext:  SCDs BLE.  1-2+ significant edema.  No cyanosis.  Chronic bruising consistent with his history of chronic anticoagulation Skin: No petechiae / purpura   Lab Results:  Recent Labs    09/02/19 0625 09/03/19 0548  WBC 6.2 6.9  HGB 11.3* 12.5*  HCT 34.9* 37.8*  PLT 149* 148*   BMET Recent Labs    09/02/19 0625 09/03/19 0548  NA 134* 133*  K 3.9 4.0  CL 97* 98  CO2 28 25  GLUCOSE 76 70  BUN 17 14  CREATININE 1.37* 1.22  CALCIUM 8.2* 8.2*   PT/INR Recent Labs    08/31/19 1954  LABPROT 14.2  INR 1.1   CMP     Component Value Date/Time   NA 133 (L) 09/03/2019 0548   NA 141 01/06/2018 1456   K 4.0 09/03/2019 0548   CL 98 09/03/2019  0548   CO2 25 09/03/2019 0548   GLUCOSE 70 09/03/2019 0548   BUN 14 09/03/2019 0548   BUN 21 01/06/2018 1456   CREATININE 1.22 09/03/2019 0548   CALCIUM 8.2 (L) 09/03/2019 0548   PROT 5.2 (L) 09/01/2019 0539   ALBUMIN 2.9 (L) 09/01/2019 0539   AST 17 09/01/2019 0539   ALT 18 09/01/2019 0539   ALKPHOS 48 09/01/2019 0539   BILITOT 0.9 09/01/2019 0539   GFRNONAA 56 (L) 09/03/2019 0548   GFRAA >60 09/03/2019 0548   Lipase     Component Value Date/Time   LIPASE 17 08/31/2019 0421       Studies/Results: Dg Abd 1 View  Result Date: 09/02/2019 CLINICAL DATA:  Encounter for nasogastric (NG) tube placement EXAM: ABDOMEN - 1 VIEW COMPARISON:  Abdominal radiograph 09/01/2019 FINDINGS: The nasogastric tube side port projects at the GE junction. The partially visualized abdomen demonstrates several mildly dilated loops of small bowel similar to the prior study as well as distal colonic gas. No evidence for free air. IMPRESSION: The nasogastric tube side port projects at the GE junction and should be advanced further into the stomach by approximately 4-5 cm. Electronically Signed   By: Audie Pinto M.D.   On: 09/02/2019 19:29   Dg Abd 2 Views  Result Date: 09/02/2019 CLINICAL DATA:  Abdominal pain.  History of small bowel obstruction. EXAM: ABDOMEN - 2 VIEW COMPARISON:  1 day prior  FINDINGS: Supine and right-sided decubitus views. Right-sided decubitus view demonstrates small-bowel air-fluid levels, without free intraperitoneal air. Supine image demonstrates persistent gaseous distension of small bowel loops, including at 4.8 cm in the left side of the abdomen today, similar 4.9 cm on the prior exam (when remeasured). Contrast has passed from the colon. Presumed phleboliths in the right hemipelvis. IMPRESSION: Similar moderate small bowel distension with innumerable small bowel air-fluid levels. Favor residual small bowel obstruction. Of note, contrast has passed from the colon since the exam  of 1 day prior. Electronically Signed   By: Abigail Miyamoto M.D.   On: 09/02/2019 13:35    Anti-infectives: Anti-infectives (From admission, onward)   None     Principal Problem:   Small bowel obstruction (HCC) Active Problems:   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   Chronic anticoagulation   Chronic systolic heart failure (HCC)   Cardiomyopathy, nonischemic and ischemic   Cardiac defibrillator -dual  [employer]   AF (paroxysmal atrial fibrillation) (HCC)   Benign fibroma of prostate   Acute on chronic kidney failure (HCC)   Chronic respiratory failure with hypoxia (HCC)   Hypothyroidism   Chronic nausea   CKD (chronic kidney disease) stage 3, GFR 30-59 ml/min    Assessment/Plan HTN HLD COPD on home O2 CHF NICM CAD ICD A. Fib on Elqius - please hold Elqiuis incase patients needs surgery during admission. Okay for heparin - above per TRH .  If palpitation can tolerate back to solid diet, most likely can restart oral Eliquis anticoagulation  SBO -Retry nasogastric tube.  Check x-rays and evaluate.  If opened up and output low, consider clamping trial.  If not consistently improved or worsen, may need repeate CT scan to rule out other possibilities or may need surgery.  He would like to hold off on surgery if possible  - Keep K >4 and MG >2 for bowel function - Ambulate for bowel function  The patient is stable.  There is no evidence of peritonitis, acute abdomen, nor shock.  There is no strong evidence of failure of improvement nor decline with current non-operative management.  There is no need for surgery at the present moment.   We will continue to follow.   FEN -  K 3.4 (replace), Mg 2.3 Mildly elevated creatinine but not oliguric.  H/o chronic kidney disease.  Defer to medicine and follow.  Seems appropriately hydrated for now VTE - SCDs, okay for heparin ID - None      LOS: 3 days   Adin Hector, MD, FACS, MASCRS  Gastrointestinal and Minimally Invasive Surgery    [address] [place], [place] [address] [phone] Main / Paging [phone] Fax

## 2019-09-03 NOTE — Progress Notes (Signed)
[facility] for IV heparin Indication: atrial fibrillation (while Apixaban on hold)  Allergies  Allergen Reactions  . Xarelto [Rivaroxaban] Other (See Comments)    Bleeding  . Adhesive [Tape] Hives, Itching and Rash  . Atorvastatin Cough  . Codeine Nausea Only  . Isosorbide Nitrate Other (See Comments)    Made him feel bad  . Latex Itching and Other (See Comments)    Rash, itching, burning  . Levofloxacin Other (See Comments)    Tachycardia  . Lisinopril Cough  . Lorazepam Other (See Comments)    "felt bad"  . Mexiletine Other (See Comments)    GI distress  . Sertraline Other (See Comments)    Made him feel bad   Patient Measurements: Height: 6\' 1"  (185.4 cm) Weight: 227 lb (103 kg) IBW/kg (Calculated) : 79.9 Heparin Dosing Weight: 100.8 kg  Vital Signs: Temp: 97.8 F (36.6 C) (10/11 0617) Temp Source: Oral (10/11 0617) BP: 135/63 (10/11 0617) Pulse Rate: 88 (10/11 0617)  Labs: Recent Labs    08/31/19 1954  09/01/19 0539  09/02/19 0625 09/02/19 1348 09/03/19 0548  HGB  --    < > 11.8*  --  11.3*  --  12.5*  HCT  --   --  36.7*  --  34.9*  --  37.8*  PLT  --   --  158  --  149*  --  148*  APTT 30  --  106*   < > 37* 71* 61*  LABPROT 14.2  --   --   --   --   --   --   INR 1.1  --   --   --   --   --   --   HEPARINUNFRC >2.20*  --  >2.20*  --  0.87* 0.82* 0.51  CREATININE  --   --  1.19  --  1.37*  --  1.22   < > = values in this interval not displayed.   Estimated Creatinine Clearance: 61.9 mL/min (by C-G formula based on SCr of 1.22 mg/dL).  Medications:  PTA Apixaban 5mg  PO BID-last dose reported as 08/30/2019 at 2030  Assessment: 78 y/oF on Apixaban PTA for atrial fibrillation admitted with chief complaint of abdominal pain and nausea, found to have small bowel obstruction. Pharmacy consulted for IV heparin dosing while Apixaban on hold. CBC: Hgb 12.8, Pltc WNL at 171K. Note, recent Apixaban use can falsely elevate  heparin levels.   Heparin started 10/8 at 1500 units/hr with no bolus due to recent Apixaban  Anticipate baseline heparin level to be falsely elevated from recent Apixaban. Therefore, will use aPTT for heparin dosing/titration until effects of Apixaban on heparin level have diminished. Then will use heparin levels for dosing.  Today, 09/03/2019  aPTT 61- slightly below the desired range of 66 - 102 sec on 1250 units/hr  Heparin level 0.51 - trending down but likely still falsely elevated due to apixaban   NPO  CBC: Hg/pltc slightly low but stable   No bleeding or infusion related issues reported by RN .   Goal of Therapy:  Heparin level 0.3-0.7 units/ml  APTT 66-102 seconds Monitor platelets by anticoagulation protocol: Yes   Plan:   Increase heparin rate at 1350 units/hr  Recheck heparin level in 6h  Daily CBC, heparin level, daily aPTT  Monitor closely for s/sx of bleeding  F/U plans to resume Eliquis  Netta Cedars, PharmD, BCPS 09/03/2019 7:15 AM

## 2019-09-04 ENCOUNTER — Inpatient Hospital Stay (HOSPITAL_COMMUNITY): Payer: No Typology Code available for payment source

## 2019-09-04 LAB — CBC
HCT: 34.1 % — ABNORMAL LOW (ref 39.0–52.0)
Hemoglobin: 11 g/dL — ABNORMAL LOW (ref 13.0–17.0)
MCH: 32.5 pg (ref 26.0–34.0)
MCHC: 32.3 g/dL (ref 30.0–36.0)
MCV: 100.9 fL — ABNORMAL HIGH (ref 80.0–100.0)
Platelets: 159 10*3/uL (ref 150–400)
RBC: 3.38 MIL/uL — ABNORMAL LOW (ref 4.22–5.81)
RDW: 14 % (ref 11.5–15.5)
WBC: 5.6 10*3/uL (ref 4.0–10.5)
nRBC: 0 % (ref 0.0–0.2)

## 2019-09-04 LAB — RENAL FUNCTION PANEL
Albumin: 2.6 g/dL — ABNORMAL LOW (ref 3.5–5.0)
Anion gap: 13 (ref 5–15)
BUN: 15 mg/dL (ref 8–23)
CO2: 22 mmol/L (ref 22–32)
Calcium: 8.2 mg/dL — ABNORMAL LOW (ref 8.9–10.3)
Chloride: 101 mmol/L (ref 98–111)
Creatinine, Ser: 1.08 mg/dL (ref 0.61–1.24)
GFR calc Af Amer: >60 mL/min (ref >60)
GFR calc non Af Amer: >60 mL/min (ref >60)
Glucose, Bld: 54 mg/dL — ABNORMAL LOW (ref 70–99)
Phosphorus: 3.1 mg/dL (ref 2.5–4.6)
Potassium: 3.9 mmol/L (ref 3.5–5.1)
Sodium: 136 mmol/L (ref 135–145)

## 2019-09-04 LAB — APTT: aPTT: 96 seconds — ABNORMAL HIGH (ref 24–36)

## 2019-09-04 LAB — HEPARIN LEVEL (UNFRACTIONATED): Heparin Unfractionated: 0.41 IU/mL (ref 0.30–0.70)

## 2019-09-04 NOTE — Progress Notes (Signed)
Hospitalist progress note  Corey Tyler  [id number] DOB: [birth year] DOA: 08/31/2019 PCP: [name], MD  Narrative: 34 WM A. fib chads score 5+ Corey Tyler ICD recent worsening HFrEF 20% COPD HLD HTN complicated by hypotension AAA CHR hypoxic failure 3 to 6 L/home BPH hypothyroid admitted for tremors 9/5 through 07/30/2019 admit 10/8 progressive abdominal distention pain nausea lack of flatus CT scan = SBO NG tube placed transition to IV heparin meds transition to IV general surgery consulted  Assessment & Plan: SBO-malfucntioning and needed to be removed-fentanyl 25-50 every 3 as needed severe pain, Zofran Compazine every 4 second choice work-trial suppository-passing some watery stool, no flatus Tells me that he wishes to have surgery--mentions Dr. Lucia Gaskins did prior surgeries on him Still has air-fluid levels on axr but seems better? Defer to gen surg  A. fib [place] score 5 on amiodarone + [facility] pacer-continue metoprolol IV 2.5 every 6 as needed pressure >170-continue amiodarone 200 po -continue heparin until it is clear no plan for surgery  Recent HFrEF from 35% to 20% echo 08/18/2019-metoprolol as above holding 25 twice daily, hold Demadex 20-30 hold Ranexa 1000 twice daily  Chronic hypoxic respiratory failure 2/2 COPD up to 6 L baseline Spiriva 2 puffs every morning reordered, reordered, Flonase, albuterol-is stable at this time  Hypertension complicated by therapy for CHF with hypotension-blood pressures stable  AKI on admission-15/1.6-->15/1.1-->14/1.22-15/0.88 continue LR at 50 cc an hour  Hypokalemia replaced  Probable dilutional anemia secondary to fluids-in the 11-12 range and no significant change  AAA-outpatient follow-up  BPH holding Proscar 5--Flomax 0.4 resumed   Hypothyroid continue Synthroid IV 66 mg  DVT prophylaxis: heparin   Code Status:   Presumed full   Family Communication:  Long d/w wife on 10/11 and 10/12 Disposition Plan: Inpatient  Consultants:    General surgery Procedures:   NG tube Antimicrobials:   None Subjective: Awake frustrated about NG tube and in discomfrt in abd--some watery stool s/p dulcolax supp No cp No fever, no cough  Objective: Vitals:   09/03/19 0617 09/03/19 1348 09/03/19 2123 09/04/19 0526  BP: 135/63 123/65 132/66 136/71  Pulse: 88 69 70 77  Resp: 16 18 18 19   Temp: 97.8 F (36.6 C) 98.1 F (36.7 C) 98.4 F (36.9 C) 97.6 F (36.4 C)  TempSrc: Oral Oral Oral Oral  SpO2: 94% (!) 87% 95% 99%  Weight:      Height:        Intake/Output Summary (Last 24 hours) at 09/04/2019 1258 Last data filed at 09/04/2019 0527 Gross per 24 hour  Intake -  Output 200 ml  Net -200 ml   Filed Weights   08/31/19 0447  Weight: 103 kg    Examination: EOMI NCAT NG tube in place on oxygen Abdomen distended slight tender RUQ S1-S2 rate controlled A. Fib cta b no added sound no rales Neurologically intact  power 5/5 move all 4 limbs equally Mild LE edeam and arm swelling as well  Data Reviewed: I have personally reviewed following labs and imaging studies CBC: Recent Labs  Lab 08/31/19 0421 09/01/19 0539 09/02/19 0625 09/03/19 0548 09/04/19 0531  WBC 8.1 8.8 6.2 6.9 5.6  NEUTROABS 6.8  --   --   --   --   HGB 12.8* 11.8* 11.3* 12.5* 11.0*  HCT 38.1* 36.7* 34.9* 37.8* 34.1*  MCV 97.4 101.1* 100.3* 98.7 100.9*  PLT 171 158 149* 148* [id number]   Basic Metabolic Panel: Recent Labs  Lab 08/31/19 0421 09/01/19 0539  09/02/19 0625 09/03/19 0548 09/04/19 0531  NA 133* 134* 134* 133* 136  K 3.4* 3.4* 3.9 4.0 3.9  CL 94* 95* 97* 98 101  CO2 27 28 28 25 22   GLUCOSE 126* 94 76 70 54*  BUN 15 15 17 14 15   CREATININE 1.62* 1.19 1.37* 1.22 1.08  CALCIUM 8.8* 8.2* 8.2* 8.2* 8.2*  MG  --  2.3 2.4  --   --   PHOS  --   --   --   --  3.1   GFR: Estimated Creatinine Clearance: 69.9 mL/min (by C-G formula based on SCr of 1.08 mg/dL). Liver Function Tests: Recent Labs  Lab 08/31/19 0421 09/01/19 0539  09/04/19 0531  AST 19 17  --   ALT 22 18  --   ALKPHOS 50 48  --   BILITOT 1.1 0.9  --   PROT 6.1* 5.2*  --   ALBUMIN 3.5 2.9* 2.6*   Recent Labs  Lab 08/31/19 0421  LIPASE 17   No results for input(s): AMMONIA in the last 168 hours. Coagulation Profile: Recent Labs  Lab 08/31/19 1954  INR 1.1   Cardiac Enzymes: Radiology Studies: Reviewed images personally in health database  Scheduled Meds: . amiodarone  200 mg Oral Daily  . bisacodyl  10 mg Rectal Daily  . levothyroxine  66 mcg Intravenous Daily  . lip balm  1 application Topical BID  . mometasone-formoterol  2 puff Inhalation BID  . sodium chloride flush  3 mL Intravenous Q12H  . tamsulosin  0.4 mg Oral QHS  . umeclidinium bromide  1 puff Inhalation Daily   Continuous Infusions: . famotidine (PEPCID) IV 20 mg (09/03/19 1143)  . heparin 1,350 Units/hr (09/03/19 2211)  . lactated ringers 50 mL/hr at 09/01/19 0924  . ondansetron (ZOFRAN) IV      LOS: 4 days   Time spent: [place], MD [facility]  If 7PM-7AM, please contact night-coverage-look on AMION to find my number otherwise-prefer pages-not epic chat,please 09/04/2019, 12:58 PM

## 2019-09-04 NOTE — Progress Notes (Signed)
ngt not placed in stomach.  Removed without diff.  Pt tolerated well.

## 2019-09-04 NOTE — Care Management Important Message (Signed)
Important Message  Patient Details IM Letter given to Sharren Bridge SW to present to the Patient Name: Corey Tyler MRN: [id number] Date of Birth: [birth year]   Medicare Important Message Given:  Yes     Kerin Salen 09/04/2019, 11:08 AM

## 2019-09-04 NOTE — TOC Initial Note (Signed)
Transition of Care [facility]) - Initial/Assessment Note    Patient Details  Name: Corey Tyler MRN: [id number] Date of Birth: 1941/09/06  Transition of Care [facility]) CM/SW Contact:    Corey Mage, LCSW Phone Number: 09/04/2019, 1:35 PM  Clinical Narrative:   Corey Tyler is a 78 YO Caucasian gentleman here due to small bowel blockage.  His wife, Corey Tyler, was at bedside, and he gave permission to include her in our discussion.  Corey Tyler DME at home includes walker, electric chair, and shower chair.  He states he recently contacted the [place], with whom he is 100% service connected, and requested a hospital bed and BSC through his PCP there, Corey Tyler.  He gets his O2 through the [place] as well.     Both Corey Tyler and his wife shared their "frustration" and "fear" related to their experience here at the hospital.  Apparently, getting an NG tube properly inserted required 8 tries. It was removed when his diet was advanced, and had to be reinserted when that was unsuccessful, requiring 3 more tries.  Furthermore, he proceeded to show me how bruised both his arms were from attempts to find his vein.  They wondered what I could do about making sure this did not happen again, and I assured them their concern was valid, and that I would follow up.  I call Corey Tyler, at [phone], and asked for her help.  She agreed to swing by to see Corey Tyler later today.    Corey Tyler talked at length about the damage he has suffered as a result of Agent Orange during his time in [place]  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  He is open to a referral to [facility], as recommended by our PT department.  He thinks he may have used [facility] in the past post hospitalization, and is open to another referral to them.  Corey Tyler states she has no openings for his insurance, but if she gets another referral that would offset his, she will let me  know.  TOC will continue to follow during the course of hospitalization.              Expected Discharge Plan: [place] Barriers to Discharge: No Barriers Identified   Patient Goals and CMS Choice Patient states their goals for this hospitalization and ongoing recovery are:: "I wish [place] the [place]."      Expected Discharge Plan and Services Expected Discharge Plan: [place]   Discharge Planning Services: CM Consult Post Acute Care Choice: [place] arrangements for the past 2 months: [place]: [place] ([place]) Date [place]: 09/04/19 Time [place]: [place] Representative spoke with at [place]: Corey Tyler  Prior Living Arrangements/Services Living arrangements for the past 2 months: Apartment Lives with:: Spouse Patient language and need for interpreter reviewed:: Yes Do you feel safe going back to the place where you live?: Yes      Need for Family Participation in Patient Care: Yes (Comment) Care giver support system in place?: Yes (comment) Current home services: DME Criminal Activity/Legal Involvement Pertinent to Current Situation/Hospitalization: No - Comment as needed  Activities of Daily Living Home Assistive Devices/Equipment: CBG Meter, Nebulizer, Oxygen, Eyeglasses, Walker (specify type)(4 wheeled walker) ADL Screening (condition at time of admission) Patient's cognitive ability adequate to safely complete daily activities?: Yes Is the patient deaf or have difficulty hearing?: No Does the patient have difficulty seeing, even when wearing glasses/contacts?: No Does the patient have difficulty concentrating, remembering, or making decisions?: No Patient able to express need for assistance with ADLs?: Yes Does the patient have difficulty dressing or bathing?: No Independently performs ADLs?: No Communication:  Independent Dressing (OT): Independent Grooming: Independent Feeding: Independent Bathing: Needs assistance Is this a change from baseline?: Pre-admission baseline Toileting: Needs assistance Is this a change from baseline?: Pre-admission baseline In/Out Bed: Needs assistance Is this a change from baseline?: Pre-admission baseline Walks in Home: Needs assistance Is this a change from baseline?: Pre-admission baseline Does the patient have difficulty walking or climbing stairs?: Yes(secondary to weakness) Weakness of Legs: Both Weakness of Arms/Hands: None  Permission Sought/Granted Permission sought to share information with : Family Supports    Share Information with NAME: Corey Tyler     Permission granted to share info w Relationship: wife     Emotional Assessment Appearance:: Appears stated age Attitude/Demeanor/Rapport: Engaged Affect (typically observed): Appropriate Orientation: : Oriented to Self, Oriented to Place, Oriented to  Time, Oriented to Situation Alcohol / Substance Use: Not Applicable Psych Involvement: No (comment)  Admission diagnosis:  Small bowel obstruction ([place]) [K56.609] Generalized abdominal pain [R10.84] Encounter for imaging study to confirm nasogastric (NG) tube placement [Z01.89] Patient Active Problem List   Diagnosis Date Noted  . CKD (chronic kidney disease) stage 3, GFR 30-59 ml/min 09/02/2019  . Small bowel obstruction ([place]) 08/31/2019  . Chronic respiratory failure with hypoxia ([place]) 08/31/2019  . Chronic anticoagulation 08/31/2019  . Hypothyroidism 08/31/2019  . Chronic nausea 08/31/2019  . Generalized weakness 07/29/2019  . Acute on chronic kidney failure ([place]) 07/29/2019  . Acute respiratory failure ([place]) 09/15/2017  . COPD exacerbation ([place]) 09/15/2017  . Sustained ventricular tachycardia ([place]) 06/10/2017  . VT (ventricular tachycardia) ([place]) 11/02/2016  . Benign fibroma of prostate 04/02/2015  . Ventricular tachycardia,  polymorphic ([place])   . Diverticulosis of colon without hemorrhage 09/25/2013  . AF (paroxysmal atrial fibrillation) ([place]) 12/29/2012  . Cardiac defibrillator -dual  [place]   . Hyperlipidemia   . PVCs 11/26/2011  . Chronic systolic heart failure ([place]) 10/07/2011  . Cardiomyopathy, nonischemic and ischemic 09/24/2011  . 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 PULMONARY NODULE 12/04/2008   PCP:  [name], MD Pharmacy:   [facility], [address] [place] [address] Phone: [phone] Fax: [place], [place] [phone] [place] [address] Phone: [phone] Fax: [phone]     Social Determinants of Health (SDOH) Interventions    Readmission Risk Interventions No flowsheet data found.

## 2019-09-04 NOTE — Progress Notes (Signed)
Subjective: CC: Spoke with patients nurse. NGT removed this AM as was not in the correct position and was not able to be advanced. Patient denies any nausea in the last 24 hours. He denies any emesis since NGT removal. He reports he has some mild distension of his abdomen but no pain. Feels this is better than yesterday. He had 2 bouts of diarrhea this AM. He was passing flatus yesterday.,  He is asking for ice cream.   When I entered the room, patient was sitting up on the side of the bed and appeared short of breath. He appeared short of breath after trying to speak full sentences.   Objective: Vital signs in last 24 hours: Temp:  [97.6 F (36.4 C)-98.4 F (36.9 C)] 97.6 F (36.4 C) (10/12 0526) Pulse Rate:  [69-77] 77 (10/12 0526) Resp:  [18-19] 19 (10/12 0526) BP: (123-136)/(65-71) 136/71 (10/12 0526) SpO2:  [87 %-99 %] 99 % (10/12 0526) Last BM Date: 09/02/19  Intake/Output from previous day: 10/11 0701 - 10/12 0700 In: -  Out: 200 [Emesis/NG output:200] Intake/Output this shift: No intake/output data recorded.  PE: Gen:  Alert, NAD, pleasant Card:  Reg irr Pulm:  On 3L. Some tachypnea, no accessory muscle use. Clear upper b/l, distant at bases. No wheezing, rhonchi or overt rales.  Abd: Soft, protuberant, mild distension, mild tenderness of the epigastrium without peritonitis. +BS. Midline infraumbilical abdominal scar well healed Ext: Bilateral pedal edema 1+ Psych: A&Ox3  Skin: no rashes noted, warm and dry  Lab Results:  Recent Labs    09/03/19 0548 09/04/19 0531  WBC 6.9 5.6  HGB 12.5* 11.0*  HCT 37.8* 34.1*  PLT 148* 159   BMET Recent Labs    09/03/19 0548 09/04/19 0531  NA 133* 136  K 4.0 3.9  CL 98 101  CO2 25 22  GLUCOSE 70 54*  BUN 14 15  CREATININE 1.22 1.08  CALCIUM 8.2* 8.2*   PT/INR No results for input(s): LABPROT, INR in the last 72 hours. CMP     Component Value Date/Time   NA 136 09/04/2019 0531   NA 141 01/06/2018 1456    K 3.9 09/04/2019 0531   CL 101 09/04/2019 0531   CO2 22 09/04/2019 0531   GLUCOSE 54 (L) 09/04/2019 0531   BUN 15 09/04/2019 0531   BUN 21 01/06/2018 1456   CREATININE 1.08 09/04/2019 0531   CALCIUM 8.2 (L) 09/04/2019 0531   PROT 5.2 (L) 09/01/2019 0539   ALBUMIN 2.6 (L) 09/04/2019 0531   AST 17 09/01/2019 0539   ALT 18 09/01/2019 0539   ALKPHOS 48 09/01/2019 0539   BILITOT 0.9 09/01/2019 0539   GFRNONAA >60 09/04/2019 0531   GFRAA >60 09/04/2019 0531   Lipase     Component Value Date/Time   LIPASE 17 08/31/2019 0421       Studies/Results: Dg Abd 1 View  Result Date: 09/02/2019 CLINICAL DATA:  Encounter for nasogastric (NG) tube placement EXAM: ABDOMEN - 1 VIEW COMPARISON:  Abdominal radiograph 09/01/2019 FINDINGS: The nasogastric tube side port projects at the GE junction. The partially visualized abdomen demonstrates several mildly dilated loops of small bowel similar to the prior study as well as distal colonic gas. No evidence for free air. IMPRESSION: The nasogastric tube side port projects at the GE junction and should be advanced further into the stomach by approximately 4-5 cm. Electronically Signed   By: Audie Pinto M.D.   On: 09/02/2019 19:29  Dg Abd 2 Views  Result Date: 09/02/2019 CLINICAL DATA:  Abdominal pain.  History of small bowel obstruction. EXAM: ABDOMEN - 2 VIEW COMPARISON:  1 day prior FINDINGS: Supine and right-sided decubitus views. Right-sided decubitus view demonstrates small-bowel air-fluid levels, without free intraperitoneal air. Supine image demonstrates persistent gaseous distension of small bowel loops, including at 4.8 cm in the left side of the abdomen today, similar 4.9 cm on the prior exam (when remeasured). Contrast has passed from the colon. Presumed phleboliths in the right hemipelvis. IMPRESSION: Similar moderate small bowel distension with innumerable small bowel air-fluid levels. Favor residual small bowel obstruction. Of note,  contrast has passed from the colon since the exam of 1 day prior. Electronically Signed   By: Abigail Miyamoto M.D.   On: 09/02/2019 13:35   Dg Abd Portable 1v  Result Date: 09/04/2019 CLINICAL DATA:  Nasogastric tube placement EXAM: PORTABLE ABDOMEN - 1 VIEW COMPARISON:  Two days ago FINDINGS: The nasogastric tube tip terminates at the level of the throat, above the glottis. Indistinct bilateral pulmonary opacity that is similar to prior. Chronic cardiomegaly. There is dual-chamber pacer leads from the left. These results will be called to the ordering clinician or representative by the Radiologist Assistant, and communication documented in the PACS or zVision Dashboard. IMPRESSION: The enteric tube tip terminates in the supraglottic neck. Electronically Signed   By: Monte Fantasia M.D.   On: 09/04/2019 07:29    Anti-infectives: Anti-infectives (From admission, onward)   None       Assessment/Plan  HTN HLD COPD on home O2 CHF NICM CAD ICD SOB A. Fib on Elqius - please hold Elqiuis incase patients needs surgery during admission. Okay for heparin - above per TRH -   SBO - Vomited with soft diet over the weekend - No current indication for emergent or urgent surgery - Having bowel function without nausea. NGT out this AM as was not in correct position and was not able to be advanced by nursing staff. Will give trial of sips of clears from the floor. If tolerates today, adv tomorrow. If he has any increased abdominal pain, distension, or develops nausea or emesis, I spoke with the patient's nurse Debbie and asked her to replace NGT.  - Keep K >4 and MG >2 for bowel function - Ambulate for bowel function - If not consistently improved or worsen, may need repeate CT scan to rule out other possibilities or may need surgery.  He would like to hold off on surgery if possible  FEN -sips of clears, K 3.9  VTE -SCDs, heparin ID -None  Plan: Trial of sips of clears from the floor. If he  has any increased abdominal pain, distension, or develops nausea or emesis, I spoke with the patient's nurse Debbie and asked her to replace NGT.    LOS: 4 days    Jillyn Ledger , [facility] 09/04/2019, 9:00 AM Pager: [phone]'

## 2019-09-04 NOTE — Progress Notes (Signed)
[name] for IV heparin Indication: atrial fibrillation (while Apixaban on hold)  Allergies  Allergen Reactions  . Xarelto [Rivaroxaban] Other (See Comments)    Bleeding  . Adhesive [Tape] Hives, Itching and Rash  . Atorvastatin Cough  . Codeine Nausea Only  . Isosorbide Nitrate Other (See Comments)    Made him feel bad  . Latex Itching and Other (See Comments)    Rash, itching, burning  . Levofloxacin Other (See Comments)    Tachycardia  . Lisinopril Cough  . Lorazepam Other (See Comments)    "felt bad"  . Mexiletine Other (See Comments)    GI distress  . Sertraline Other (See Comments)    Made him feel bad   Patient Measurements: Height: 6\' 1"  (185.4 cm) Weight: 227 lb (103 kg) IBW/kg (Calculated) : Corey.9 Heparin Dosing Weight: 100.8 kg  Vital Signs: Temp: 97.6 F (36.4 C) (10/12 0526) Temp Source: Oral (10/12 0526) BP: 136/71 (10/12 0526) Pulse Rate: 77 (10/12 0526)  Labs: Recent Labs    09/02/19 0625 09/02/19 1348 09/03/19 0548 09/03/19 1346 09/04/19 0531  HGB 11.3*  --  12.5*  --  11.0*  HCT 34.9*  --  37.8*  --  34.1*  PLT 149*  --  148*  --  159  APTT 37* 71* 61* 88* 96*  HEPARINUNFRC 0.87* 0.82* 0.51  --  0.41  CREATININE 1.37*  --  1.22  --  1.08   Estimated Creatinine Clearance: 69.9 mL/min (by C-G formula based on SCr of 1.08 mg/dL).  Medications:  PTA Apixaban 5mg  PO BID-last dose reported as 08/30/2019 at 2030  Assessment: Corey Tyler on Apixaban PTA for atrial fibrillation admitted with chief complaint of abdominal pain and nausea, found to have small bowel obstruction. Pharmacy consulted for IV heparin dosing while Apixaban on hold. CBC: Hgb 12.8, Pltc WNL at 171K. Note, recent Apixaban use can falsely elevate heparin levels.   Heparin started 10/8 at 1500 units/hr with no bolus due to recent Apixaban  Anticipate baseline heparin level to be falsely elevated from recent Apixaban. Therefore, will use aPTT  for heparin dosing/titration until effects of Apixaban on heparin level have diminished. Then will use heparin levels for dosing.  Today, 09/04/2019  Hep level 0.41 units/ml & aPTT 96 seconds on 1350 units/hr Hep level and aPTT correlating better, passing flatus CBC: Hg/pltc slightly low but stable  No bleeding or infusion related issues reported by RN .   Goal of Therapy:  Heparin level 0.3-0.7 units/ml  APTT 66-102 seconds Monitor platelets by anticoagulation protocol: Yes   Plan:   Continue Heparin infusion at 1350 units/hr  Daily CBC, heparin level, daily aPTT  Monitor closely for s/sx of bleeding  F/U plans to resume Eliquis once SBO resolved  Minda Ditto PharmD Pager [phone] 09/04/2019, 8:12 AM

## 2019-09-05 ENCOUNTER — Inpatient Hospital Stay (HOSPITAL_COMMUNITY): Payer: No Typology Code available for payment source

## 2019-09-05 DIAGNOSIS — K56609 Unspecified intestinal obstruction, unspecified as to partial versus complete obstruction: Secondary | ICD-10-CM

## 2019-09-05 DIAGNOSIS — Z01810 Encounter for preprocedural cardiovascular examination: Secondary | ICD-10-CM

## 2019-09-05 DIAGNOSIS — I480 Paroxysmal atrial fibrillation: Secondary | ICD-10-CM

## 2019-09-05 DIAGNOSIS — Z7901 Long term (current) use of anticoagulants: Secondary | ICD-10-CM

## 2019-09-05 DIAGNOSIS — I428 Other cardiomyopathies: Secondary | ICD-10-CM

## 2019-09-05 LAB — BASIC METABOLIC PANEL
Anion gap: 10 (ref 5–15)
BUN: 12 mg/dL (ref 8–23)
CO2: 24 mmol/L (ref 22–32)
Calcium: 8.1 mg/dL — ABNORMAL LOW (ref 8.9–10.3)
Chloride: 101 mmol/L (ref 98–111)
Creatinine, Ser: 1.14 mg/dL (ref 0.61–1.24)
GFR calc Af Amer: >60 mL/min (ref >60)
GFR calc non Af Amer: >60 mL/min (ref >60)
Glucose, Bld: 80 mg/dL (ref 70–99)
Potassium: 3.7 mmol/L (ref 3.5–5.1)
Sodium: 135 mmol/L (ref 135–145)

## 2019-09-05 LAB — CBC
HCT: 34.8 % — ABNORMAL LOW (ref 39.0–52.0)
Hemoglobin: 11.2 g/dL — ABNORMAL LOW (ref 13.0–17.0)
MCH: 32.5 pg (ref 26.0–34.0)
MCHC: 32.2 g/dL (ref 30.0–36.0)
MCV: 100.9 fL — ABNORMAL HIGH (ref 80.0–100.0)
Platelets: 165 10*3/uL (ref 150–400)
RBC: 3.45 MIL/uL — ABNORMAL LOW (ref 4.22–5.81)
RDW: 14.1 % (ref 11.5–15.5)
WBC: 5.1 10*3/uL (ref 4.0–10.5)
nRBC: 0 % (ref 0.0–0.2)

## 2019-09-05 LAB — HEPARIN LEVEL (UNFRACTIONATED)
Heparin Unfractionated: 0.27 IU/mL — ABNORMAL LOW (ref 0.30–0.70)
Heparin Unfractionated: 0.41 IU/mL (ref 0.30–0.70)

## 2019-09-05 LAB — APTT: aPTT: 105 seconds — ABNORMAL HIGH (ref 24–36)

## 2019-09-05 MED ORDER — TORSEMIDE 20 MG PO TABS
20.0000 mg | ORAL_TABLET | ORAL | Status: DC
  Administered 2019-09-05 – 2019-09-08 (×4): 20 mg via ORAL
  Filled 2019-09-05 (×5): qty 1

## 2019-09-05 MED ORDER — HEPARIN (PORCINE) 25000 UT/250ML-% IV SOLN
1400.0000 [IU]/h | INTRAVENOUS | Status: DC
  Administered 2019-09-05 – 2019-09-06 (×2): 1400 [IU]/h via INTRAVENOUS
  Filled 2019-09-05 (×5): qty 250

## 2019-09-05 MED ORDER — ORAL CARE MOUTH RINSE
15.0000 mL | Freq: Two times a day (BID) | OROMUCOSAL | Status: DC
  Administered 2019-09-05 – 2019-09-08 (×6): 15 mL via OROMUCOSAL

## 2019-09-05 MED ORDER — TORSEMIDE 20 MG PO TABS: 30.0000 mg | ORAL_TABLET | ORAL | Status: DC

## 2019-09-05 MED ORDER — METOPROLOL SUCCINATE ER 25 MG PO TB24
25.0000 mg | ORAL_TABLET | Freq: Every day | ORAL | Status: DC
  Administered 2019-09-05 – 2019-09-06 (×2): 25 mg via ORAL
  Filled 2019-09-05 (×2): qty 1

## 2019-09-05 NOTE — Consult Note (Signed)
Cardiology Consultation:   Patient ID: Corey Tyler; [id number]; 1941/03/16   Admit date: 08/31/2019 Date of Consult: 09/05/2019  Primary Care Provider: [name], MD Primary Cardiologist: Corey Breeding, MD Primary Electrophysiologist:  None    Patient Profile:   Corey Tyler is a 78 y.o. male with a PMH of chronic combined CHF, paroxysmal atrial fibrillation, VT s/p ICD and ablation in 2018, minimal non-obstructive CAD on LHC in 2013, HTN, HLD, COPD,  who is being seen today for the evaluation of preoperative assessment at the request of Dr. Verlon Tyler.  History of Present Illness:   [name] was in his usual state of health until 08/30/2019 when he developed abdominal pain and nausea. Symptoms progressively worsened and he presented with [facility] where he was admitted with an SBO. NG tube was placed for conservative management. Surgery has been following. NG tube removed yesterday and patient trialed clears without recurrent N/V. Cardiology asked to evaluate for preoperative assessment.  He was last evaluated by cardiology at an outpatient visit with Dr. Percival Tyler 08/29/2019, at which time he was doing well from a cardiac standpoint. He had recently been taken off entresto and metoprolol dose reduced for management of weakness which improved with these med changes. His last echocardiogram was 07/2019 which showed EF 25-30% (previously 45-50% 08/2017), G1DD, mild LVH, mild LAE, mild TR/MR/AI, and mildly elevated PA pressures. His last ischemic evaluation was a NST in 2015 was without ischemia. LHC in 2000 which revealed normal coronary arteries with mild plaque in aorta. Last device interrogation 07/2019 with 1% AF burden, otherwise no arrhythmias and normal function.   He reports doing well from a cardiac standpoint since his last visit with Dr. Percival Tyler. He reports improvement in his energy level since his medications were adjusted. No complaints of chest pain or changes in chronic DOE. He  reports breathing has been stable this admission. At baseline he can walk 1 block with his walker before experiencing weakness and need to take a break. No complaints of chest pain or SOB with ADLs, walking in his home, or cooking. Otherwise he is fairly sedentary. He denies recent palpitations, LE edema, orthopnea, PND, dizziness, lightheadedness, or syncope. He reports improvement in his abdominal pain. No recurrent N/V since NG tube removed yesterday. Had a watery BM just prior to my evaluation. He is craving an egg sandwich.    Past Medical History:  Diagnosis Date  . A-fib ([place])    a. Noted on 12/2012 interrogation, placed on Apixaban and ultimately stopped NOACs 2/2 personal decision 8/14.  Marland Kitchen AICD (automatic cardioverter/defibrillator) present    Cardiac defibrillator -dual  [employer]  . Arthritis   . Atrial tachycardia-non sustained    a. Noted on 03/2012 interrogation.  . Chronic systolic heart failure (H[facility])    EF down to 20 to 25% per echo November 2012  . COPD (chronic obstructive pulmonary disease) ([place])   . Hyperlipidemia   . Hypertension   . ICD (implantable cardiac defibrillator) in place   . NICM (nonischemic cardiomyopathy) ([place])    a. Normal cors 2000. b. Minimal plaque 2013.  . Old myocardial infarction    a. ?Silent MI. b. Large fixed inferolateral defect c/w with prior infarct, no ischemia. c. Cath in 2000/2013 with only minimal CAD.  Marland Kitchen Presence of permanent cardiac pacemaker   . Pulmonary nodule, right    Last scan in 2010 showing stability; felt to be benign.  Marland Kitchen PVC's (premature ventricular contractions)   . Ventricular tachycardia, polymorphic (  [place])    Rx via ICD 12/14    Past Surgical History:  Procedure Laterality Date  . APPENDECTOMY    . CARDIAC CATHETERIZATION  2000  . CARDIAC DEFIBRILLATOR PLACEMENT    . CARDIOVERSION  03/30/2013   Procedure: CARDIOVERSION;  Surgeon: Corey Headings, MD;  Location: [facility];  Service: Cardiovascular;;  . COLON SURGERY     . COLONOSCOPY N/A 09/25/2013   Procedure: COLONOSCOPY;  Surgeon: Corey Bears, MD;  Location: [facility] END[facility]COPY;  Service: Endoscopy;  Laterality: N/A;  . ICD  12/2011   Corey Tyler  . IMPLANTABLE CARDIOVERTER DEFIBRILLATOR IMPLANT N/A 01/06/2012   Procedure: IMPLANTABLE CARDIOVERTER DEFIBRILLATOR IMPLANT;  Surgeon: Corey Sprang, MD;  Location: [facility] CATH LAB;  Service: Cardiovascular;  Laterality: N/A;  . PACEMAKER IMPLANT     [name]  . TEE WITHOUT CARDIOVERSION N/A 03/30/2013   Procedure: TRANSESOPHAGEAL ECHOCARDIOGRAM (TEE);  Surgeon: Corey Headings, MD;  Location: [facility];  Service: Cardiovascular;  Laterality: N/A;  . V TACH ABLATION  06/10/2017  . V TACH ABLATION N/A 06/10/2017   Procedure: V Tach Ablation;  Surgeon: Corey Lance, MD;  Location: [facility] CV LAB;  Service: Cardiovascular;  Laterality: N/A;     Home Medications:  Prior to Admission medications   Medication Sig Start Date End Date Taking? Authorizing Provider  albuterol (PROVENTIL) (2.5 MG/3ML) 0.083% nebulizer solution Take 2.5 mg by nebulization every 6 (six) hours as needed for wheezing or shortness of breath.   Yes [provider]  albuterol (VENTOLIN HFA) 108 (90 Base) MCG/ACT inhaler Inhale 2 puffs into the lungs every 6 (six) hours as needed for wheezing or shortness of breath.   Yes [provider]  amiodarone (PACERONE) 200 MG tablet Take 2 tablets (400 mg total) by mouth daily. Take 400mg  by mouth daily for 1 month, then resume 200mg  by mouth daily dose. Patient taking differently: Take 200 mg by mouth daily.  12/02/17  Yes Corey Sprang, MD  Carboxymethylcellul-Glycerin (LUBRICATING EYE DROPS OP) Place 1 drop into both eyes daily as needed (dry eyes).    Yes [provider]  cholecalciferol (VITAMIN D) 1000 UNITS tablet Take 1,000 Units by mouth daily.   Yes [provider]  ELIQUIS 5 MG TABS tablet TAKE 1 TABLET BY MOUTH TWICE A DAY Patient taking differently: Take 5 mg by  mouth 2 (two) times daily.  07/30/15  Yes Corey Breeding, MD  finasteride (PROSCAR) 5 MG tablet Take 5 mg by mouth daily.    Yes [provider]  fluticasone (FLONASE) 50 MCG/ACT nasal spray Place 1 spray into both nostrils daily as needed for allergies.  09/08/17  Yes [provider]  Fluticasone-Salmeterol (ADVAIR DISKUS) 250-50 MCG/DOSE AEPB Inhale 1 puff into the lungs 2 (two) times daily as needed (shortness of breath/wheezing).    Yes [provider]  levothyroxine (SYNTHROID, LEVOTHROID) 125 MCG tablet Take 125 mcg by mouth daily. 06/15/17  Yes [provider]  loratadine (CLARITIN) 10 MG tablet Take 10 mg by mouth daily as needed for allergies.    Yes [provider]  MAGNESIUM PO Take 1 tablet by mouth at bedtime.   Yes [provider]  Melatonin 3 MG TABS Take 3 mg by mouth at bedtime.   Yes [provider]  metoprolol tartrate (LOPRESSOR) 50 MG tablet Take 0.5 tablets (25 mg total) by mouth 2 (two) times daily. 07/30/19  Yes Domenic Polite, MD  Multiple Vitamin (MULTIVITAMIN WITH MINERALS) TABS tablet Take  1 tablet by mouth daily.   Yes [provider]  nitroGLYCERIN (NITROSTAT) 0.4 MG SL tablet Place 0.4 mg under the tongue every 5 (five) minutes as needed for chest pain.  11/12/11  Yes Burtis Junes, NP  Omega-3 Fatty Acids (FISH OIL) 500 MG CAPS Take 500 mg by mouth 2 (two) times daily.   Yes [provider]  OXYGEN Inhale 3-6 L into the lungs daily. 3 at rest, 4-6 with exertion   Yes [provider]  pantoprazole (PROTONIX) 40 MG tablet Take 40 mg by mouth daily as needed (acid reflux/indigestion).   Yes [provider]  potassium chloride (K-DUR) 10 MEQ tablet Take 10 mEq by mouth at bedtime.    Yes [provider]  promethazine (PHENERGAN) 25 MG tablet Take 25 mg by mouth daily as needed for nausea or vomiting.  04/20/17  Yes [provider]  Psyllium (METAMUCIL PO)  Take 1 Dose by mouth daily. Mix in liquid and drink    Yes [provider]  ranolazine (RANEXA) 1000 MG SR tablet Take 1 tablet (1,000 mg total) by mouth 2 (two) times daily. 09/23/18  Yes Corey Sprang, MD  Tamsulosin HCl (FLOMAX) 0.4 MG CAPS Take 0.4 mg by mouth at bedtime.    Yes [provider]  Tiotropium Bromide Monohydrate (SPIRIVA RESPIMAT) 2.5 MCG/ACT AERS Inhale 2 puffs into the lungs every morning.   Yes [provider]  torsemide (DEMADEX) 20 MG tablet Take 1 tablet by mouth once daily Patient taking differently: Take 20-30 mg by mouth daily. 20 MG on Monday, Tuesday, Wednesday, Thursday, Friday 30 MG on Saturday and Sunday 06/20/19  Yes Seiler, Luetta Nutting K, NP  acetaminophen (TYLENOL) 500 MG tablet Take 500 mg by mouth every 6 (six) hours as needed for headache (pain).    [provider]    Inpatient Medications: Scheduled Meds: . amiodarone  200 mg Oral Daily  . bisacodyl  10 mg Rectal Daily  . levothyroxine  66 mcg Intravenous Daily  . lip balm  1 application Topical BID  . mouth rinse  15 mL Mouth Rinse BID  . mometasone-formoterol  2 puff Inhalation BID  . sodium chloride flush  3 mL Intravenous Q12H  . tamsulosin  0.4 mg Oral QHS  . umeclidinium bromide  1 puff Inhalation Daily   Continuous Infusions: . famotidine (PEPCID) IV 20 mg (09/04/19 1305)  . heparin 1,400 Units/hr (09/05/19 0854)  . lactated ringers 50 mL/hr at 09/04/19 2113  . ondansetron (ZOFRAN) IV     PRN Meds: acetaminophen **OR** acetaminophen, albuterol, fentaNYL (SUBLIMAZE) injection, fluticasone, magic mouthwash, metoprolol tartrate, ondansetron (ZOFRAN) IV **OR** ondansetron (ZOFRAN) IV, phenol, polyvinyl alcohol, prochlorperazine  Allergies:    Allergies  Allergen Reactions  . Xarelto [Rivaroxaban] Other (See Comments)    Bleeding  . Adhesive [Tape] Hives, Itching and Rash  . Atorvastatin Cough  . Codeine Nausea Only  . Isosorbide Nitrate Other (See Comments)     Made him feel bad  . Latex Itching and Other (See Comments)    Rash, itching, burning  . Levofloxacin Other (See Comments)    Tachycardia  . Lisinopril Cough  . Lorazepam Other (See Comments)    "felt bad"  . Mexiletine Other (See Comments)    GI distress  . Sertraline Other (See Comments)    Made him feel bad    Social History:   Social History   Socioeconomic History  . Marital status: Married    Spouse name:  Not on file  . Number of children: 2  . Years of education: Not on file  . Highest education level: Not on file  Occupational History  . Occupation: Retired    Comment: Press photographer  . Occupation: Works    Fish farm manager: [employer]  . Financial resource strain: Not on file  . Food insecurity    Worry: Not on file    Inability: Not on file  . Transportation needs    Medical: Not on file    Non-medical: Not on file  Tobacco Use  . Smoking status: Former Smoker    Packs/day: 2.50    Years: 40.00    Pack years: 100.00    Types: Cigarettes    Quit date: 11/24/1983    Years since quitting: 35.8  . Smokeless tobacco: Never Used  . Tobacco comment: 2 1/2 ppd x 40 years  Substance and Sexual Activity  . Alcohol use: No  . Drug use: No  . Sexual activity: Yes    Birth control/protection: None  Lifestyle  . Physical activity    Days per week: Not on file    Minutes per session: Not on file  . Stress: Not on file  Relationships  . Social Herbalist on phone: Not on file    Gets together: Not on file    Attends religious service: Not on file    Active member of club or organization: Not on file    Attends meetings of clubs or organizations: Not on file    Relationship status: Not on file  . Intimate partner violence    Fear of current or ex partner: Not on file    Emotionally abused: Not on file    Physically abused: Not on file    Forced sexual activity: Not on file  Other Topics Concern  . Not on file  Social History Narrative    Still works at [employer] part-time   Lives with Wife in [place]    Family History:    Family History  Problem Relation Age of Onset  . Emphysema Mother   . Heart disease Mother        AVR  . Heart failure Father        Died agwe 4  . Esophageal cancer Brother   . Colon cancer Neg Hx      ROS:  Please see the history of present illness.   All other ROS reviewed and negative.     Physical Exam/Data:   Vitals:   09/04/19 0526 09/04/19 1351 09/04/19 1949 09/05/19 0452  BP: 136/71 132/67 138/71 124/71  Pulse: 77 68 63 74  Resp: 19 18 16 17   Temp: 97.6 F (36.4 C) 98.4 F (36.9 C) 98.4 F (36.9 C) 98 F (36.7 C)  TempSrc: Oral Oral Oral Oral  SpO2: 99% 96% 93% 95%  Weight:      Height:        Intake/Output Summary (Last 24 hours) at 09/05/2019 0921 Last data filed at 09/05/2019 0600 Gross per 24 hour  Intake 1917.54 ml  Output 225 ml  Net 1692.54 ml   Filed Weights   08/31/19 0447  Weight: 103 kg   Body mass index is 29.95 kg/m.  General:  Well nourished, well developed, in no acute distress HEENT: sclera anicteric  Neck: no JVD Vascular: No carotid bruits; distal pulses 2+ bilaterally Cardiac:  normal S1, S2; RRR; no murmurs, rubs, or gallops Lungs:  clear to  auscultation bilaterally, no wheezing, rhonchi or rales  Abd: hypoactive bowel sounds, non-tender, mildly distended Ext: 1+ LE edema Musculoskeletal:  No deformities, BUE and BLE strength normal and equal Skin: warm and dry; ecchymosis to bilateral upper extremities at venipuncture sites  Neuro:  CNs 2-12 intact, no focal abnormalities noted Psych:  Normal affect   EKG:  The EKG was personally reviewed and demonstrates:  None this admission Telemetry:  Telemetry was personally reviewed and demonstrates:  A-paced, NSR  Relevant CV Studies: Echocardiogram 08/18/2019: 1. Left ventricular ejection fraction, by visual estimation, is 25 to 30%. The left ventricle has severely decreased function.  Mildly increased left ventricular size. There is mildly increased left ventricular hypertrophy.  2. Left ventricular diastolic Doppler parameters are consistent with impaired relaxation pattern of LV diastolic filling.  3. Global right ventricle has normal systolic function.The right ventricular size is mildly enlarged.  4. Left atrial size was mildly dilated.  5. Right atrial size was normal.  6. The mitral valve is normal in structure. Mild mitral valve regurgitation. No evidence of mitral stenosis.  7. The tricuspid valve is normal in structure. Tricuspid valve regurgitation is mild.  8. The aortic valve is tricuspid Aortic valve regurgitation is mild by color flow Doppler. Mild aortic valve sclerosis without stenosis.  9. The pulmonic valve was grossly normal. Pulmonic valve regurgitation is trivial by color flow Doppler. 10. Aortic dilatation noted. 11. There is mild dilatation of the aortic root measuring 39 mm. 12. Mildly elevated pulmonary artery systolic pressure. 13. A pacer wire is visualized. 14. The inferior vena cava is normal in size with greater than 50% respiratory variability, suggesting right atrial pressure of 3 mmHg. 15. Severe LV systolic dysfunction; grade 1 diastolic dysfunction; mild LVH and LVE; mild AI, MR and TR; mild LAE and RVE; mildly elevated pulmonary pressure.  AAA [place] 08/23/2019: Abdominal Aorta: There is evidence of abnormal dilatation of the distal Abdominal aorta. The largest aortic measurement is 3.2 cm. Stenosis: Aorta-iliac atherosclerosis without focal stenosis.  Laboratory Data:  Chemistry Recent Labs  Lab 09/03/19 0548 09/04/19 0531 09/05/19 0532  NA 133* 136 135  K 4.0 3.9 3.7  CL 98 101 101  CO2 25 22 24   GLUCOSE 70 54* 80  BUN 14 15 12   CREATININE 1.22 1.08 1.14  CALCIUM 8.2* 8.2* 8.1*  GFRNONAA 56* >60 >60  GFRAA >60 >60 >60  ANIONGAP 10 13 10     Recent Labs  Lab 08/31/19 0421 09/01/19 0539 09/04/19 0531  PROT 6.1* 5.2*  --    ALBUMIN 3.5 2.9* 2.6*  AST 19 17  --   ALT 22 18  --   ALKPHOS 50 48  --   BILITOT 1.1 0.9  --    Hematology Recent Labs  Lab 09/03/19 0548 09/04/19 0531 09/05/19 0532  WBC 6.9 5.6 5.1  RBC 3.83* 3.38* 3.45*  HGB 12.5* 11.0* 11.2*  HCT 37.8* 34.1* 34.8*  MCV 98.7 100.9* 100.9*  MCH 32.6 32.5 32.5  MCHC 33.1 32.3 32.2  RDW 14.2 14.0 14.1  PLT 148* 159 165   Cardiac EnzymesNo results for input(s): TROPONINI in the last 168 hours. No results for input(s): TROPIPOC in the last 168 hours.  BNPNo results for input(s): BNP, PROBNP in the last 168 hours.  DDimer No results for input(s): DDIMER in the last 168 hours.  Radiology/Studies:  Dg Abd 1 View  Result Date: 09/02/2019 CLINICAL DATA:  Encounter for nasogastric (NG) tube placement EXAM: ABDOMEN - 1 VIEW COMPARISON:  Abdominal radiograph 09/01/2019 FINDINGS: The nasogastric tube side port projects at the GE junction. The partially visualized abdomen demonstrates several mildly dilated loops of small bowel similar to the prior study as well as distal colonic gas. No evidence for free air. IMPRESSION: The nasogastric tube side port projects at the GE junction and should be advanced further into the stomach by approximately 4-5 cm. Electronically Signed   By: Audie Pinto M.D.   On: 09/02/2019 19:29   Dg Abd 2 Views  Result Date: 09/02/2019 CLINICAL DATA:  Abdominal pain.  History of small bowel obstruction. EXAM: ABDOMEN - 2 VIEW COMPARISON:  1 day prior FINDINGS: Supine and right-sided decubitus views. Right-sided decubitus view demonstrates small-bowel air-fluid levels, without free intraperitoneal air. Supine image demonstrates persistent gaseous distension of small bowel loops, including at 4.8 cm in the left side of the abdomen today, similar 4.9 cm on the prior exam (when remeasured). Contrast has passed from the colon. Presumed phleboliths in the right hemipelvis. IMPRESSION: Similar moderate small bowel distension with  innumerable small bowel air-fluid levels. Favor residual small bowel obstruction. Of note, contrast has passed from the colon since the exam of 1 day prior. Electronically Signed   By: Abigail Miyamoto M.D.   On: 09/02/2019 13:35   Dg Abd Acute 2+v W 1v Chest  Result Date: 09/04/2019 CLINICAL DATA:  Small-bowel obstruction EXAM: DG ABDOMEN ACUTE W/ 1V CHEST COMPARISON:  Abdominal radiograph dated 09/04/2019 and CT abdomen dated 08/31/2019 FINDINGS: A left subclavian approach cardiac device is redemonstrated. Left greater than right bibasilar atelectasis/airspace disease appears similar to prior exam given differences in technique. The heart remains enlarged. Vascular calcifications are seen in the aortic arch. Trace bilateral pleural effusions are difficult to exclude. There is no pneumothorax. Multiple distended air-filled loops of bowel are seen with fewer air-fluid levels than exam dated 09/02/2019. IMPRESSION: 1. Left greater than right bibasilar atelectasis/airspace disease is not significantly changed. 2. Multiple distended air-filled loops of bowel with fewer air-fluid levels than on exam dated 09/02/2019. This may reflect resolving bowel obstruction. Electronically Signed   By: Zerita Boers M.D.   On: 09/04/2019 10:23   Dg Abd Portable 1v  Result Date: 09/04/2019 CLINICAL DATA:  Nasogastric tube placement EXAM: PORTABLE ABDOMEN - 1 VIEW COMPARISON:  Two days ago FINDINGS: The nasogastric tube tip terminates at the level of the throat, above the glottis. Indistinct bilateral pulmonary opacity that is similar to prior. Chronic cardiomegaly. There is dual-chamber pacer leads from the left. These results will be called to the ordering clinician or representative by the Radiologist Assistant, and communication documented in the PACS or zVision Dashboard. IMPRESSION: The enteric tube tip terminates in the supraglottic neck. Electronically Signed   By: Monte Fantasia M.D.   On: 09/04/2019 07:29     Assessment and Plan:   1. Preoperative assessment: He has a history of silent MI with fixed inferolateral defect on prior ischemic evaluation but minimal CAD on prior cardiac catheterization. Also with chronic combined CHF. He has been conservatively managed for SBO with potential need for surgical management which is a high-risk surgery. He is generally limited in mobility 2/2 deconditioning and O2 demands but can complete 4 METs without anginal complaints (ADLs, cooking, walking in his home, walking on level ground for 1 block) - Based on the revised cardiac risk index, patient has a score of 3 (high-risk surgery, ischemic heart disease history, and CHF history), with a 15% risk of adverse cardiac event in the perioperative setting.  -  Based on ACC/AHA guidelines, he would be at acceptable risk for the planned procedure without further cardiovascular testing.   2. Chronic combined CHF: EF 25-30% with G1DD on echo 07/2019. Home torsemide and metoprolol held this admission due to NPO status with SBO. Entresto recently discontinued due to intolerance.  - Will restart metoprolol - transition to succinate 25mg  daily  - Will restart torsemide as previously prescribed  (20mg  M/T/W/Th/F; 30mg  Sat/Sun)  3. Paroxysmal atrial fibrillation: rate well controlled this admission. Low AF burden on last device interrogation 07/2019. Home eliquis on hold and transitioned to heparin gtt in case surgery is needed. - Continue amiodarone for rhythm control - Restart metoprolol for rate control - Continue heparin gtt for now - resume home eliquis for stroke ppx once clear surgery is not necessary  4. HTN: Home metoprolol on hold given NPO status. BP overall stable on prn metoprolol IV.  - Will restart metoprolol (transition to succinate) and torsemide  5. CAD: minimal non-obstructive disease noted on LHC in 2000. Reported to have fixed inferolateral defect on prior ischemic evaluation. Not on asa due to anticoagulation  need. Not on statin due to intolerance to atorvastatin - Resume ranexa when tolerating po - Will restart metoprolol - transition to succinate 25mg  daily  6. SBO: patient presented with abdominal pain and nausea. Found to have SBO. Managed conservatively with NPO status and NG tube. Surgery following and symptoms improving. Tolerated clears yesterday. AXR ordered for today - Continue management per primary team and surgery    For questions or updates, please contact [facility] Please consult [email or web address] for contact info under Cardiology/STEMI.   Signed, Abigail Butts, PA-C  09/05/2019 9:21 AM [phone]

## 2019-09-05 NOTE — Progress Notes (Addendum)
[place] for IV heparin Indication: atrial fibrillation (while Apixaban on hold)  Allergies  Allergen Reactions  . Xarelto [Rivaroxaban] Other (See Comments)    Bleeding  . Adhesive [Tape] Hives, Itching and Rash  . Atorvastatin Cough  . Codeine Nausea Only  . Isosorbide Nitrate Other (See Comments)    Made him feel bad  . Latex Itching and Other (See Comments)    Rash, itching, burning  . Levofloxacin Other (See Comments)    Tachycardia  . Lisinopril Cough  . Lorazepam Other (See Comments)    "felt bad"  . Mexiletine Other (See Comments)    GI distress  . Sertraline Other (See Comments)    Made him feel bad   Patient Measurements: Height: 6\' 1"  (185.4 cm) Weight: 227 lb (103 kg) IBW/kg (Calculated) : 79.9 Heparin Dosing Weight: 100.8 kg  Vital Signs: Temp: 98 F (36.7 C) (10/13 0452) Temp Source: Oral (10/13 0452) BP: 124/71 (10/13 0452) Pulse Rate: 74 (10/13 0452)  Labs: Recent Labs    09/03/19 0548 09/03/19 1346 09/04/19 0531 09/05/19 0532  HGB 12.5*  --  11.0* 11.2*  HCT 37.8*  --  34.1* 34.8*  PLT 148*  --  159 165  APTT 61* 88* 96* 105*  HEPARINUNFRC 0.51  --  0.41 0.27*  CREATININE 1.22  --  1.08 1.14   Estimated Creatinine Clearance: 66.2 mL/min (by C-G formula based on SCr of 1.14 mg/dL).  Medications:  PTA Apixaban 5mg  PO BID-last dose reported as 08/30/2019 at 2030  Assessment: 78 y/oF on Apixaban PTA for atrial fibrillation admitted with chief complaint of abdominal pain and nausea, found to have small bowel obstruction. Pharmacy consulted for IV heparin dosing while Apixaban on hold. CBC: Hgb 12.8, Pltc WNL at 171K. Note, recent Apixaban use can falsely elevate heparin levels.   Heparin started 10/8 at 1500 units/hr with no bolus due to recent Apixaban  Anticipate baseline heparin level to be falsely elevated from recent Apixaban. Therefore, will use aPTT for heparin dosing/titration until effects of  Apixaban on heparin level have diminished. Then will use heparin levels for dosing.  Today, 09/05/2019  Hep level 0.27 units/ml & aPTT 106 seconds on 1350 units/hr Hep level low and aPTT increased, last Eliquis 10/7 CBC: Hg/pltc slightly low but stable  No bleeding or infusion related issues reported by RN .   Goal of Therapy:  Heparin level 0.3-0.7 units/ml  APTT 66-102 seconds Monitor platelets by anticoagulation protocol: Yes   Plan:   Increase Heparin infusion to 1400 units/hr  Recheck Hep level at 1700  Anticipate transition back to Eliquis as long as no surgery needed, SBO resolving  Daily CBC, heparin level,   Monitor closely for s/sx of bleeding  Minda Ditto PharmD Pager [phone] 09/05/2019, 7:14 AM

## 2019-09-05 NOTE — Progress Notes (Addendum)
Hospitalist progress note  DAE TARAS  [id number] DOB: [birth year] DOA: 08/31/2019 PCP: [name], MD  Narrative: 25 WM A. fib chads score 5+ Saint [name] ICD recent worsening HFrEF 20% COPD HLD HTN complicated by hypotension AAA CHR hypoxic failure 3 to 6 L/home BPH hypothyroid admitted for tremors 9/5 through 07/30/2019 admit 10/8 progressive abdominal distention pain nausea lack of flatus CT scan = SBO NG tube placed transition to IV heparin meds transition to IV general surgery consulted  Assessment & Plan: SBO-Defer to gen surg-last x-ray 10/12 clear-diet as per them  A. fib [place] score 5 on amiodarone + Saint [name] pacer-cardiology resumed metoprolol XL 25, Demadex per home dosing -continue amiodarone 200 po -continue heparin until it is clear no plan for surgery  Recent HFrEF from 35% to 20% echo 08/18/2019 on hold currently- Ran[place]a 1000 twice daily  Chronic hypoxic respiratory failure 2/2 COPD up to 6 L baseline Spiriva 2 puffs every morning reordered, reordered, Flonase, albuterol-is stable at this time  Hypertension complicated by therapy for CHF with hypotension-blood pressures stable  AKI on admission-saline locked 10/13 by cardiology-currently 12/1.1 creatinine  Hypokalemia replaced  Probable dilutional anemia secondary to fluids-in the 11-12 range and no significant change  AAA-outpatient follow-up  BPH holding Proscar 5--Flomax 0.4 resumed   Hypothyroid continue Synthroid IV 66 mg  DVT prophylaxis: heparin   Code Status:   Presumed full   Family Communication:  Long d/w wife on 10/11 and 10/12 Disposition Plan: Inpatient  Consultants:   General surgery Procedures:   NG tube Antimicrobials:   None Subjective:  Better-multiple stools overnight abdominal pain less no fever no chills  Objective: Vitals:   09/04/19 1351 09/04/19 1949 09/05/19 0452 09/05/19 1416  BP: 132/67 138/71 124/71 129/71  Pulse: 68 63 74 67  Resp: 18 16 17 18   Temp: 98.4 F (36.9 C)  98.4 F (36.9 C) 98 F (36.7 C) 98.6 F (37 C)  TempSrc: Oral Oral Oral Oral  SpO2: 96% 93% 95% 90%  Weight:      Height:        Intake/Output Summary (Last 24 hours) at 09/05/2019 1520 Last data filed at 09/05/2019 0600 Gross per 24 hour  Intake 1917.54 ml  Output 225 ml  Net 1692.54 ml   Filed Weights   08/31/19 0447  Weight: 103 kg    Examination: EOMI NCAT  Abdomen distended distended but slight tenderness S1-S2 rate controlled A. Fib cta b no added sound no rales Neurologically intact  power 5/5 move all 4 limbs equally  Data Reviewed: I have personally reviewed following labs and imaging studies CBC: Recent Labs  Lab 08/31/19 0421 09/01/19 0539 09/02/19 0625 09/03/19 0548 09/04/19 0531 09/05/19 0532  WBC 8.1 8.8 6.2 6.9 5.6 5.1  NEUTROABS 6.8  --   --   --   --   --   HGB 12.8* 11.8* 11.3* 12.5* 11.0* 11.2*  HCT 38.1* 36.7* 34.9* 37.8* 34.1* 34.8*  MCV 97.4 101.1* 100.3* 98.7 100.9* 100.9*  PLT 171 158 149* 148* 159 [id number]   Basic Metabolic Panel: Recent Labs  Lab 09/01/19 0539 09/02/19 0625 09/03/19 0548 09/04/19 0531 09/05/19 0532  NA 134* 134* 133* 136 135  K 3.4* 3.9 4.0 3.9 3.7  CL 95* 97* 98 101 101  CO2 28 28 25 22 24   GLUCOSE 94 76 70 54* 80  BUN 15 17 14 15 12   CREATININE 1.19 1.37* 1.22 1.08 1.14  CALCIUM 8.2* 8.2* 8.2* 8.2* 8.1*  MG  2.3 2.4  --   --   --   PHOS  --   --   --  3.1  --    GFR: Estimated Creatinine Clearance: 66.2 mL/min (by C-G formula based on SCr of 1.14 mg/dL). Liver Function Tests: Recent Labs  Lab 08/31/19 0421 09/01/19 0539 09/04/19 0531  AST 19 17  --   ALT 22 18  --   ALKPHOS 50 48  --   BILITOT 1.1 0.9  --   PROT 6.1* 5.2*  --   ALBUMIN 3.5 2.9* 2.6*   Recent Labs  Lab 08/31/19 0421  LIPASE 17   No results for input(s): AMMONIA in the last 168 hours. Coagulation Profile: Recent Labs  Lab 08/31/19 1954  INR 1.1   Cardiac Enzymes: Radiology Studies: Reviewed images personally in health  database  Scheduled Meds: . amiodarone  200 mg Oral Daily  . bisacodyl  10 mg Rectal Daily  . levothyroxine  66 mcg Intravenous Daily  . lip balm  1 application Topical BID  . mouth rinse  15 mL Mouth Rinse BID  . metoprolol succinate  25 mg Oral Daily  . mometasone-formoterol  2 puff Inhalation BID  . sodium chloride flush  3 mL Intravenous Q12H  . tamsulosin  0.4 mg Oral QHS  . torsemide  20 mg Oral Q MTWThF  . [START ON 09/09/2019] torsemide  30 mg Oral Once per day on Sun Sat  . umeclidinium bromide  1 puff Inhalation Daily   Continuous Infusions: . famotidine (PEPCID) IV 20 mg (09/05/19 1220)  . heparin 1,400 Units/hr (09/05/19 1216)  . ondansetron (ZOFRAN) IV      LOS: 5 days   Time spent: [place], MD [facility]  If 7PM-7AM, please contact night-coverage-look on AMION to find my number otherwise-prefer pages-not epic chat,please 09/05/2019, 3:20 PM

## 2019-09-05 NOTE — Progress Notes (Signed)
OT Cancellation Note  Patient Details Name: Corey Tyler MRN: [id number] DOB: 12-18-1940   Cancelled Treatment:    Reason Eval/Treat Not Completed: Patient at procedure or test/ unavailable (xray) Kari Baars, OT Acute Rehabilitation Services [phone] Office- [phone], Thereasa Parkin 09/05/2019, 2:43 PM

## 2019-09-05 NOTE — TOC Progression Note (Signed)
Transition of Care [facility]) - Progression Note    Patient Details  Name: Corey Tyler MRN: [id number] Date of Birth: Jul 22, 1941  Transition of Care [facility]) CM/SW [place], [place] Phone Number: 09/05/2019, 3:44 PM  Clinical Narrative:  Left messages for [facility] [place].     Expected Discharge Plan: [place] Barriers to Discharge: No Barriers Identified  Expected Discharge Plan and Services Expected Discharge Plan: [place]   Discharge Planning Services: CM Consult Post Acute Care Choice: [place] arrangements for the past 2 months: [place]: [place] ([place]) Date [place]: 09/04/19 Time [place]: [place] Representative spoke with at [place]: [place] ([place]) Interventions    Readmission Risk Interventions No flowsheet data found.

## 2019-09-05 NOTE — Progress Notes (Signed)
Occupational Therapy Treatment Patient Details Name: Corey Tyler MRN: [id number] DOB: 09/20/1941 Today's Date: 09/05/2019    History of present illness 78 yo male admitted to ED on 10/8 with abdominal pain, and nausea, found to have SBO and has NGT placed (now removed). PMHx:  CHF, cardiomyopathy, COPD, HTN, HLD, CAD, MI, AICD, R pulm nodule, atherosclerosis, a-fib with mult ablations, TEE.   OT comments  Pt agreeable to OT.  Pt had busy day but agreed to OOB with OT in prep for dinner  Follow Up Recommendations  Home health [place] Hospital bed;3 in 1 bedside commode(Pt and spouse attempting to obtain hospital bed from [place] which would be beneficial given pt's positioning needs during sleep secondary to becoming SOB (most recently sleeping in recliner at home per spouse report).)    Recommendations for Other Services      Precautions / Restrictions Precautions Precautions: Fall Precaution Comments: sats Restrictions Weight Bearing Restrictions: No       Mobility Bed Mobility Overal bed mobility: Needs Assistance Bed Mobility: Rolling;Sidelying to Sit Rolling: Supervision Sidelying to sit: Min assist       General bed mobility comments: pt up in chair upon PT arrival to room.  Transfers Overall transfer level: Needs assistance Equipment used: Rolling walker (2 wheeled) Transfers: Sit to/from [facility] Sit to Stand: Min assist Stand pivot transfers: Min assist       General transfer comment: min assist for steadying, VC for hand placement when rising x1.    Balance Overall balance assessment: Needs assistance Sitting-balance support: No upper extremity supported;Feet supported Sitting balance-Leahy Scale: Good     Standing balance support: Bilateral upper extremity supported Standing balance-Leahy Scale: Fair                             ADL either performed or assessed with clinical judgement   ADL  Overall ADL's : Needs assistance/impaired     Grooming: Wash/dry hands;Wash/dry face;Supervision/safety;Set up;Standing               Lower Body Dressing: Supervision/safety;Sit to/from stand;Cueing for compensatory techniques;Cueing for safety Lower Body Dressing Details (indicate cue type and reason): socks Toilet Transfer: Supervision/safety;Stand-pivot;Cueing for safety;Cueing for sequencing   Toileting- Clothing Manipulation and Hygiene: Moderate assistance;Sit to/from stand;Min guard         General ADL Comments: wife present and will a pt with ADL activity as needed     Vision Patient Visual Report: No change from baseline            Cognition Arousal/Alertness: Awake/alert Behavior During Therapy: WFL for tasks assessed/performed Overall Cognitive Status: Within Functional Limits for tasks assessed                                                     Pertinent Vitals/ Pain       Pain Assessment: No/denies pain Faces Pain Scale: Hurts a little bit Pain Location: abdomen Pain Descriptors / Indicators: Other (Comment)(nausea) Pain Intervention(s): Limited activity within patient's tolerance;Monitored during session;Repositioned;RN gave pain meds during session(nausea meds)         Frequency  Min 2X/week        Progress Toward Goals  OT Goals(current goals can now be found in the care plan section)  Progress towards OT goals: Progressing toward goals  Acute Rehab OT Goals Patient Stated Goal: home  Plan Discharge plan remains appropriate       AM-PAC OT "6 Clicks" Daily Activity     Outcome Measure   Help from another person eating meals?: None Help from another person taking care of personal grooming?: A Little Help from another person toileting, which includes using toliet, bedpan, or urinal?: A Little Help from another person bathing (including washing, rinsing, drying)?: A Little Help from another person to put on and  taking off regular upper body clothing?: A Little Help from another person to put on and taking off regular lower body clothing?: A Little 6 Click Score: 19    End of Session Equipment Utilized During Treatment: Gait belt;Rolling walker  OT Visit Diagnosis: Unsteadiness on feet (R26.81);Muscle weakness (generalized) (M62.81)   Activity Tolerance Patient tolerated treatment well;Patient limited by fatigue   Patient Left in chair;with family/visitor present;with call bell/phone within reach(nursing student taking VS)   Nurse Communication Mobility status(OT spoke with nursing student present at end of session re: pt's t/f status.)        Time: 0350-0404 OT Time Calculation (min): 14 min  Charges: OT General Charges $OT Visit: 1 Visit OT Treatments $Self Care/Home Management : 8-22 mins  Kari Baars, [place] [phone] Office- [phone]     [place], Edwena Felty D 09/05/2019, 4:17 PM

## 2019-09-05 NOTE — Progress Notes (Signed)
Physical Therapy Treatment Patient Details Name: Corey Tyler MRN: [id number] DOB: 1941/09/23 Today's Date: 09/05/2019    History of Present Illness 78 yo male admitted to ED on 10/8 with abdominal pain, and nausea, found to have SBO and has NGT placed (now removed). PMHx:  CHF, cardiomyopathy, COPD, HTN, HLD, CAD, MI, AICD, R pulm nodule, atherosclerosis, a-fib with mult ablations, TEE.    PT Comments    Pt with improved ambulation distance this session, requiring very minimal cuing for safety. Pt with less dyspnea noted during ambulation, and required no standing rest breaks to recover. Will continue to follow.   Follow Up Recommendations  Home health PT;Supervision for mobility/OOB     Equipment Recommendations  None recommended by PT(pt and wife going through [place] for toilet riser, hospital bed)    Recommendations for Other Services       Precautions / Restrictions Precautions Precautions: Fall Precaution Comments: sats Restrictions Weight Bearing Restrictions: No    Mobility  Bed Mobility Overal bed mobility: Needs Assistance             General bed mobility comments: pt up in chair upon PT arrival to room.  Transfers Overall transfer level: Needs assistance Equipment used: Rolling walker (2 wheeled) Transfers: Sit to/from Stand Sit to Stand: Min assist         General transfer comment: min assist for steadying, VC for hand placement when rising x1.  Ambulation/Gait Ambulation/Gait assistance: Min guard Gait Distance (Feet): 240 Feet Assistive device: Rolling walker (2 wheeled) Gait Pattern/deviations: Step-through pattern;Decreased stride length;Trunk flexed Gait velocity: decr   General Gait Details: min guard for safety, pt placed on 6LO2 during ambulation as this is pt baseline. Verbal cuing for upright posture.   Stairs             Wheelchair Mobility    Modified Rankin (Stroke Patients Only)       Balance Overall balance  assessment: Needs assistance Sitting-balance support: No upper extremity supported;Feet supported Sitting balance-Leahy Scale: Good     Standing balance support: Bilateral upper extremity supported Standing balance-Leahy Scale: Fair                              Cognition Arousal/Alertness: Awake/alert Behavior During Therapy: WFL for tasks assessed/performed Overall Cognitive Status: Within Functional Limits for tasks assessed                                        Exercises General Exercises - Lower Extremity Ankle Circles/Pumps: AROM;Both;20 reps;Seated Long Arc Quad: AROM;Both;15 reps;Seated Straight Leg Raises: AROM;Both;5 reps;Seated    General Comments        Pertinent Vitals/Pain Pain Assessment: Faces Faces Pain Scale: Hurts a little bit Pain Location: abdomen Pain Descriptors / Indicators: Other (Comment)(nausea) Pain Intervention(s): Limited activity within patient's tolerance;Monitored during session;Repositioned;RN gave pain meds during session(nausea meds)    Home Living                      Prior Function            PT Goals (current goals can now be found in the care plan section) Acute Rehab PT Goals Patient Stated Goal: home PT Goal Formulation: With patient Time For Goal Achievement: 09/15/19 Potential to Achieve Goals: Good Progress towards PT goals: Progressing toward goals  Frequency    Min 3X/week      PT Plan Current plan remains appropriate    Co-evaluation              AM-PAC PT "6 Clicks" Mobility   Outcome Measure  Help needed turning from your back to your side while in a flat bed without using bedrails?: A Little Help needed moving from lying on your back to sitting on the side of a flat bed without using bedrails?: A Little Help needed moving to and from a bed to a chair (including a wheelchair)?: A Little Help needed standing up from a chair using your arms (e.g., wheelchair or  bedside chair)?: A Little Help needed to walk in hospital room?: A Little Help needed climbing 3-5 steps with a railing? : A Little 6 Click Score: 18    End of Session Equipment Utilized During Treatment: Oxygen;Gait belt Activity Tolerance: Patient tolerated treatment well;Patient limited by fatigue Patient left: in chair;with call bell/phone within reach;with nursing/sitter in room(wife to stay in room with pt) Nurse Communication: Mobility status PT Visit Diagnosis: Other abnormalities of gait and mobility (R26.89);Unsteadiness on feet (R26.81)     Time: [id number] PT Time Calculation (min) (ACUTE ONLY): 19 min  Charges:  $Gait Training: 8-22 mins                     Corey Tyler, PT Acute Rehabilitation Services Pager [phone]  Office [phone]   Corey Tyler 09/05/2019, 2:04 PM

## 2019-09-05 NOTE — Progress Notes (Signed)
Pharmacy: [facility]  Patient's a 78 y.o M currently on heparin for hx afib (home Eliquis on hold).  Heparin level is therapeutic at 0.41 (goal 0.3-0.7) with rate increased to 1400 units/hr earlier today.   Plan: - continue heparin drip at 1400 units/hr - f/u with AM labs and adjust rate if needed - monitor for s/s bleeding  Dia Sitter, PharmD, BCPS 09/05/2019 5:59 PM

## 2019-09-06 DIAGNOSIS — I5042 Chronic combined systolic (congestive) and diastolic (congestive) heart failure: Secondary | ICD-10-CM

## 2019-09-06 DIAGNOSIS — E039 Hypothyroidism, unspecified: Secondary | ICD-10-CM

## 2019-09-06 DIAGNOSIS — J9611 Chronic respiratory failure with hypoxia: Secondary | ICD-10-CM

## 2019-09-06 DIAGNOSIS — K565 Intestinal adhesions [bands], unspecified as to partial versus complete obstruction: Principal | ICD-10-CM

## 2019-09-06 DIAGNOSIS — N183 Chronic kidney disease, stage 3 unspecified: Secondary | ICD-10-CM

## 2019-09-06 DIAGNOSIS — R1084 Generalized abdominal pain: Secondary | ICD-10-CM

## 2019-09-06 LAB — CBC
HCT: 33.4 % — ABNORMAL LOW (ref 39.0–52.0)
Hemoglobin: 10.9 g/dL — ABNORMAL LOW (ref 13.0–17.0)
MCH: 33.1 pg (ref 26.0–34.0)
MCHC: 32.6 g/dL (ref 30.0–36.0)
MCV: 101.5 fL — ABNORMAL HIGH (ref 80.0–100.0)
Platelets: 162 10*3/uL (ref 150–400)
RBC: 3.29 MIL/uL — ABNORMAL LOW (ref 4.22–5.81)
RDW: 14.3 % (ref 11.5–15.5)
WBC: 5.3 10*3/uL (ref 4.0–10.5)
nRBC: 0 % (ref 0.0–0.2)

## 2019-09-06 LAB — RENAL FUNCTION PANEL
Albumin: 2.8 g/dL — ABNORMAL LOW (ref 3.5–5.0)
Anion gap: 9 (ref 5–15)
BUN: 12 mg/dL (ref 8–23)
CO2: 28 mmol/L (ref 22–32)
Calcium: 8.1 mg/dL — ABNORMAL LOW (ref 8.9–10.3)
Chloride: 98 mmol/L (ref 98–111)
Creatinine, Ser: 1.46 mg/dL — ABNORMAL HIGH (ref 0.61–1.24)
GFR calc Af Amer: 52 mL/min — ABNORMAL LOW (ref >60)
GFR calc non Af Amer: 45 mL/min — ABNORMAL LOW (ref >60)
Glucose, Bld: 99 mg/dL (ref 70–99)
Phosphorus: 3.8 mg/dL (ref 2.5–4.6)
Potassium: 3.3 mmol/L — ABNORMAL LOW (ref 3.5–5.1)
Sodium: 135 mmol/L (ref 135–145)

## 2019-09-06 LAB — MAGNESIUM: Magnesium: 1.9 mg/dL (ref 1.7–2.4)

## 2019-09-06 LAB — HEPARIN LEVEL (UNFRACTIONATED): Heparin Unfractionated: 0.38 IU/mL (ref 0.30–0.70)

## 2019-09-06 MED ORDER — LEVOTHYROXINE SODIUM 125 MCG PO TABS
125.0000 ug | ORAL_TABLET | Freq: Every day | ORAL | Status: DC
  Administered 2019-09-07 – 2019-09-08 (×2): 125 ug via ORAL
  Filled 2019-09-06 (×2): qty 1

## 2019-09-06 NOTE — Progress Notes (Signed)
PT Cancellation Note  Patient Details Name: Corey Tyler MRN: [id number] DOB: 1941/01/09   Cancelled Treatment:     on arrival, pt in bathroom extended time attempting to have a BM.  "They gave me a suppository".  Will attempt to see another time/day as schedule permits.  Pt has been evaluated by LPT with rec for [facility] PT as he plans to D/C to home with spouse.     Rica Koyanagi  PTA Acute  Rehabilitation Services Pager      [phone] Office      [phone]

## 2019-09-06 NOTE — Progress Notes (Signed)
[place] for IV heparin Indication: atrial fibrillation (while Apixaban on hold)  Allergies  Allergen Reactions  . Xarelto [Rivaroxaban] Other (See Comments)    Bleeding  . Adhesive [Tape] Hives, Itching and Rash  . Atorvastatin Cough  . Codeine Nausea Only  . Isosorbide Nitrate Other (See Comments)    Made him feel bad  . Latex Itching and Other (See Comments)    Rash, itching, burning  . Levofloxacin Other (See Comments)    Tachycardia  . Lisinopril Cough  . Lorazepam Other (See Comments)    "felt bad"  . Mexiletine Other (See Comments)    GI distress  . Sertraline Other (See Comments)    Made him feel bad   Patient Measurements: Height: 6\' 1"  (185.4 cm) Weight: 227 lb (103 kg) IBW/kg (Calculated) : 79.9 Heparin Dosing Weight: 100.8 kg  Vital Signs: Temp: 98 F (36.7 C) (10/14 0527) Temp Source: Oral (10/14 0527) BP: 110/75 (10/14 0527) Pulse Rate: 84 (10/14 0527)  Labs: Recent Labs    09/03/19 1346 09/04/19 0531 09/05/19 0532 09/05/19 1712  HGB  --  11.0* 11.2*  --   HCT  --  34.1* 34.8*  --   PLT  --  159 165  --   APTT 88* 96* 105*  --   HEPARINUNFRC  --  0.41 0.27* 0.41  CREATININE  --  1.08 1.14  --    Estimated Creatinine Clearance: 66.2 mL/min (by C-G formula based on SCr of 1.14 mg/dL).  Medications:  PTA Apixaban 5mg  PO BID-last dose reported as 08/30/2019 at 2030  Assessment: 78 y/oF on Apixaban PTA for atrial fibrillation admitted with chief complaint of abdominal pain and nausea, found to have small bowel obstruction. Pharmacy consulted for IV heparin dosing while Apixaban on hold. CBC: Hgb 12.8, Pltc WNL at 171K. Note, recent Apixaban use can falsely elevate heparin levels.   Heparin started 10/8 at 1500 units/hr with no bolus due to recent Apixaban  Anticipate baseline heparin level to be falsely elevated from recent Apixaban. Therefore, will use aPTT for heparin dosing/titration until effects of  Apixaban on heparin level have diminished. Then will use heparin levels for dosing.  Today, 09/06/2019  Hep level in therapeutic range 0.41 units/ml  CBC: Hgb low,stable  Plt wnl  No bleeding or infusion related issues reported by RN Diet adv to FL, no surgical intervention if continues to improve  Goal of Therapy:  Heparin level 0.3-0.7 units/ml  APTT 66-102 seconds Monitor platelets by anticoagulation protocol: Yes   Plan:   Continue Heparin infusion at 1400 units/hr  Anticipate transition back to Eliquis when final decision made - no surgery needed, SBO resolving  Daily CBC, heparin level,   Monitor closely for s/sx of bleeding  Minda Ditto PharmD Pager [phone] 09/06/2019, 6:51 AM

## 2019-09-06 NOTE — Progress Notes (Addendum)
Patient ID: Corey Tyler, male   DOB: 09/23/1941, 78 y.o.   MRN: [id number]       Subjective: Patient feels great today.  Tolerating clear liquids with no issues.  Moving his bowels and actually had a more solid stool yesterday.  No nausea, no abdominal pain.  Objective: Vital signs in last 24 hours: Temp:  [97.9 F (36.6 C)-98.6 F (37 C)] 98 F (36.7 C) (10/14 0527) Pulse Rate:  [67-84] 84 (10/14 0527) Resp:  [18-19] 18 (10/14 0527) BP: (110-129)/(71-75) 110/75 (10/14 0527) SpO2:  [90 %-97 %] 93 % (10/14 0527) Last BM Date: 09/06/19  Intake/Output from previous day: 10/13 0701 - 10/14 0700 In: 642.2 [I.V.:592.2; IV Piggyback:50] Out: 2200 [Urine:2200] Intake/Output this shift: Total I/O In: -  Out: 200 [Urine:200]  PE: Abd: soft, NT, Nd, +BS  Lab Results:  Recent Labs    09/05/19 0532 09/06/19 0643  WBC 5.1 5.3  HGB 11.2* 10.9*  HCT 34.8* 33.4*  PLT 165 162   BMET Recent Labs    09/05/19 0532 09/06/19 0643  NA 135 135  K 3.7 3.3*  CL 101 98  CO2 24 28  GLUCOSE 80 99  BUN 12 12  CREATININE 1.14 1.46*  CALCIUM 8.1* 8.1*   PT/INR No results for input(s): LABPROT, INR in the last 72 hours. CMP     Component Value Date/Time   NA 135 09/06/2019 0643   NA 141 01/06/2018 1456   K 3.3 (L) 09/06/2019 0643   CL 98 09/06/2019 0643   CO2 28 09/06/2019 0643   GLUCOSE 99 09/06/2019 0643   BUN 12 09/06/2019 0643   BUN 21 01/06/2018 1456   CREATININE 1.46 (H) 09/06/2019 0643   CALCIUM 8.1 (L) 09/06/2019 0643   PROT 5.2 (L) 09/01/2019 0539   ALBUMIN 2.8 (L) 09/06/2019 0643   AST 17 09/01/2019 0539   ALT 18 09/01/2019 0539   ALKPHOS 48 09/01/2019 0539   BILITOT 0.9 09/01/2019 0539   GFRNONAA 45 (L) 09/06/2019 0643   GFRAA 52 (L) 09/06/2019 0643   Lipase     Component Value Date/Time   LIPASE 17 08/31/2019 0421       Studies/Results: Dg Abd 2 Views  Result Date: 09/05/2019 CLINICAL DATA:  Abdominal pain and nausea. Small-bowel obstruction.  EXAM: ABDOMEN - 2 VIEW COMPARISON:  09/04/2019 FINDINGS: Patient continues to show some dilated gas-filled small intestine consistent with partial small bowel obstruction. The pattern has not worsened since yesterday. No sign of free air. No nasogastric tube evident. IMPRESSION: Persistent partial small bowel obstruction pattern. No free air. No nasogastric tube evident. Persistent small bowel partial obstruction pattern, similar to yesterday. Electronically Signed   By: Nelson Chimes M.D.   On: 09/05/2019 16:34   Dg Abd Acute 2+v W 1v Chest  Result Date: 09/04/2019 CLINICAL DATA:  Small-bowel obstruction EXAM: DG ABDOMEN ACUTE W/ 1V CHEST COMPARISON:  Abdominal radiograph dated 09/04/2019 and CT abdomen dated 08/31/2019 FINDINGS: A left subclavian approach cardiac device is redemonstrated. Left greater than right bibasilar atelectasis/airspace disease appears similar to prior exam given differences in technique. The heart remains enlarged. Vascular calcifications are seen in the aortic arch. Trace bilateral pleural effusions are difficult to exclude. There is no pneumothorax. Multiple distended air-filled loops of bowel are seen with fewer air-fluid levels than exam dated 09/02/2019. IMPRESSION: 1. Left greater than right bibasilar atelectasis/airspace disease is not significantly changed. 2. Multiple distended air-filled loops of bowel with fewer air-fluid levels than on exam dated 09/02/2019.  This may reflect resolving bowel obstruction. Electronically Signed   By: Zerita Boers M.D.   On: 09/04/2019 10:23    Anti-infectives: Anti-infectives (From admission, onward)   None       Assessment/Plan HTN HLD COPD on home O2 CHF NICM CAD ICD SOB A. Fib on Elqius - if tolerates fulls today, can likely resume eliquis tomorrow  SBO - improving.  Tolerating clear liquids -ad to FLD today, may have saltines -mobilize -appreciate cardiology input, hopefully can avoid surgical intervention at this  point.  FEN -FLD VTE -SCDs, heparin ID -None   LOS: 6 days    Henreitta Cea , [facility] 09/06/2019, 8:43 AM Please see Amion for pager number during day hours 7:00am-4:30pm

## 2019-09-06 NOTE — Progress Notes (Signed)
[facility]  PROGRESS NOTE  CHUKWUEMEKA CARRA [id number] DOB: February 24, 1941 DOA: 08/31/2019 PCP: [name], MD  Brief History    Jt Haefs Richardson is a 78 y.o. year old male with medical history significant for CHF with reduced EF status post ICD, A. fib, chronic hypoxic respiratory failure (2 to 6 L at home), BPH, hypothyroidism who presented on 08/31/2019 with worsening abdominal pain, distentionnausea and lack of flatus and was found to have small bowel obstruction on ct abdomen imaging due to adhesions.  Cardiology evaluated patient preoperative assessment 10/13 he will be acceptable risk for any planned procedure.  Patient is now tolerating oral diet seems SBO is improving  A & P     Small bowel obstruction, improving.  No longer requiring NG tube.  Advance from clear to full liquid today and tolerating well.  Continue to closely monitor per surgery.  Encourage as patient is passing flatus and having normal BMs.   Paroxysmal A. fib we will.  CHA2DS2-VASc score of 5.  Continue amiodarone, metoprolol XL, currently on IV heparin, doubt need for surgery, will likely transition to oral Eliquis next 24 hours   Chronic combined CHF with reduced EF of 25-30% on 07/2019, euvolemic on exam.  Continue home torsemide and metoprolol succinate   Chronic hypoxic respiratory failure secondary to COPD, stable.  Currently on 3 L at rest maintaining normal O2 sats.  Continue home inhalers.   CKD stage III.  Creatinine stable at baseline 1.4-1.6.  Avoid nephrotoxins, monitor BMP.   Hypertension, stable.  Continue beta-blockers   CAD.  History of nonobstructive disease on left heart cath in 2000 and.  Asymptomatic care.  Will resume Ranexa now tolerating p.o.   AAA continue outpatient follow-up.   BPH.Continue Flomax, monitor output.   Hypothyroidism, stable continue Synthroid (transition back to oral today)    Chronic macrocytic anemia.  Hemoglobin remained stable at 11-12.  No active  signs of bleeding..  Closely monitor.     DVT prophylaxis: IV heparin Code Status: Full Family Communication: Wife updated at bedside Disposition Plan: Continue to monitor his advance diet     [facility] Direct contact: see [email or web address] (further directions at bottom of note if needed) 7PM-7AM contact night coverage as at bottom of note 09/06/2019, 5:44 PM  LOS: 6 days   Consultants  . Surgery, cardiology  Procedures  . None  Antibiotics  . None  Interval History/Subjective  Feeling better Passing flatus Had a normal BM Improvement in abdominal pain Tolerated saltine crackers with minimal nausea  Objective   Vitals:  Vitals:   09/06/19 1145 09/06/19 1337  BP:  122/68  Pulse:  75  Resp:  18  Temp:  98.1 F (36.7 C)  SpO2: 92% 93%    Exam:  Awake, alert, oriented x3, normal affect No JVD No respiratory effort on 4 L O2 Irregularly irregular rhythm, normal rate, no peripheral edema Distended abdomen, soft, not tender with palpation, bowel sounds heard   I have personally reviewed the following:   Data Reviewed: Basic Metabolic Panel: Recent Labs  Lab 09/01/19 0539 09/02/19 0625 09/03/19 0548 09/04/19 0531 09/05/19 0532 09/06/19 0643  NA 134* 134* 133* 136 135 135  K 3.4* 3.9 4.0 3.9 3.7 3.3*  CL 95* 97* 98 101 101 98  CO2 28 28 25 22 24 28   GLUCOSE 94 76 70 54* 80 99  BUN 15 17 14 15 12 12   CREATININE 1.19 1.37* 1.22 1.08 1.14 1.46*  CALCIUM 8.2* 8.2* 8.2*  8.2* 8.1* 8.1*  MG 2.3 2.4  --   --   --  1.9  PHOS  --   --   --  3.1  --  3.8   Liver Function Tests: Recent Labs  Lab 08/31/19 0421 09/01/19 0539 09/04/19 0531 09/06/19 0643  AST 19 17  --   --   ALT 22 18  --   --   ALKPHOS 50 48  --   --   BILITOT 1.1 0.9  --   --   PROT 6.1* 5.2*  --   --   ALBUMIN 3.5 2.9* 2.6* 2.8*   Recent Labs  Lab 08/31/19 0421  LIPASE 17   No results for input(s): AMMONIA in the last 168 hours. CBC: Recent Labs  Lab 08/31/19 0421   09/02/19 0625 09/03/19 0548 09/04/19 0531 09/05/19 0532 09/06/19 0643  WBC 8.1   < > 6.2 6.9 5.6 5.1 5.3  NEUTROABS 6.8  --   --   --   --   --   --   HGB 12.8*   < > 11.3* 12.5* 11.0* 11.2* 10.9*  HCT 38.1*   < > 34.9* 37.8* 34.1* 34.8* 33.4*  MCV 97.4   < > 100.3* 98.7 100.9* 100.9* 101.5*  PLT 171   < > 149* 148* 159 165 162   < > = values in this interval not displayed.   Cardiac Enzymes: No results for input(s): CKTOTAL, CKMB, CKMBINDEX, TROPONINI in the last 168 hours. BNP (last 3 results) Recent Labs    04/13/19 1506 07/29/19 1514  BNP 111.1* 66.6    ProBNP (last 3 results) No results for input(s): PROBNP in the last 8760 hours.  CBG: No results for input(s): GLUCAP in the last 168 hours.  Recent Results (from the past 240 hour(s))  SARS CORONAVIRUS 2 (TAT 6-24 HRS) Nasopharyngeal Nasopharyngeal Swab     Status: None   Collection Time: 08/31/19 10:05 AM   Specimen: Nasopharyngeal Swab  Result Value Ref Range Status   SARS Coronavirus 2 NEGATIVE NEGATIVE Final    Comment: (NOTE) SARS-CoV-2 target nucleic acids are NOT DETECTED. The SARS-CoV-2 RNA is generally detectable in upper and lower respiratory specimens during the acute phase of infection. Negative results do not preclude SARS-CoV-2 infection, do not rule out co-infections with other pathogens, and should not be used as the sole basis for treatment or other patient management decisions. Negative results must be combined with clinical observations, patient history, and epidemiological information. The expected result is Negative. Fact Sheet for Patients: SugarRoll.be Fact Sheet for Healthcare Providers: https://www.woods-mathews.com/ This test is not yet approved or cleared by the [place] FDA and  has been authorized for detection and/or diagnosis of SARS-CoV-2 by FDA under an Emergency Use Authorization (EUA). This EUA will remain  in effect (meaning this  test can be used) for the duration of the COVID-19 declaration under Section 56 4(b)(1) of the Act, 21 U.S.C. section 360bbb-3(b)(1), unless the authorization is terminated or revoked sooner. Performed at [facility], [place] [address] [place], [place] [address]      Studies: Dg Abd 2 Views  Result Date: 09/05/2019 CLINICAL DATA:  Abdominal pain and nausea. Small-bowel obstruction. EXAM: ABDOMEN - 2 VIEW COMPARISON:  09/04/2019 FINDINGS: Patient continues to show some dilated gas-filled small intestine consistent with partial small bowel obstruction. The pattern has not worsened since yesterday. No sign of free air. No nasogastric tube evident. IMPRESSION: Persistent partial small bowel obstruction pattern. No free  air. No nasogastric tube evident. Persistent small bowel partial obstruction pattern, similar to yesterday. Electronically Signed   By: Nelson Chimes M.D.   On: 09/05/2019 16:34    Scheduled Meds: . amiodarone  200 mg Oral Daily  . bisacodyl  10 mg Rectal Daily  . [START ON 09/07/2019] levothyroxine  125 mcg Oral Q0600  . lip balm  1 application Topical BID  . mouth rinse  15 mL Mouth Rinse BID  . metoprolol succinate  25 mg Oral Daily  . mometasone-formoterol  2 puff Inhalation BID  . sodium chloride flush  3 mL Intravenous Q12H  . tamsulosin  0.4 mg Oral QHS  . torsemide  20 mg Oral Q MTWThF  . [START ON 09/09/2019] torsemide  30 mg Oral Once per day on Sun Sat  . umeclidinium bromide  1 puff Inhalation Daily   Continuous Infusions: . famotidine (PEPCID) IV 20 mg (09/06/19 1245)  . heparin 1,400 Units/hr (09/05/19 1216)  . 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

## 2019-09-06 NOTE — Care Management Important Message (Signed)
Important Message  Patient Details  Name: Corey Tyler MRN: [id number] Date of Birth: [birth year]   Medicare Important Message Given:  Yes. CMA printed out IM for Care Management Nurse or CSW to give to patient.      [name] 09/06/2019, 8:32 AM

## 2019-09-07 DIAGNOSIS — E876 Hypokalemia: Secondary | ICD-10-CM

## 2019-09-07 DIAGNOSIS — K56600 Partial intestinal obstruction, unspecified as to cause: Secondary | ICD-10-CM

## 2019-09-07 LAB — CBC
HCT: 33.8 % — ABNORMAL LOW (ref 39.0–52.0)
Hemoglobin: 11.1 g/dL — ABNORMAL LOW (ref 13.0–17.0)
MCH: 32.7 pg (ref 26.0–34.0)
MCHC: 32.8 g/dL (ref 30.0–36.0)
MCV: 99.7 fL (ref 80.0–100.0)
Platelets: 160 10*3/uL (ref 150–400)
RBC: 3.39 MIL/uL — ABNORMAL LOW (ref 4.22–5.81)
RDW: 14.4 % (ref 11.5–15.5)
WBC: 6 10*3/uL (ref 4.0–10.5)
nRBC: 0 % (ref 0.0–0.2)

## 2019-09-07 LAB — BASIC METABOLIC PANEL
Anion gap: 12 (ref 5–15)
BUN: 8 mg/dL (ref 8–23)
CO2: 30 mmol/L (ref 22–32)
Calcium: 8.4 mg/dL — ABNORMAL LOW (ref 8.9–10.3)
Chloride: 95 mmol/L — ABNORMAL LOW (ref 98–111)
Creatinine, Ser: 1.44 mg/dL — ABNORMAL HIGH (ref 0.61–1.24)
GFR calc Af Amer: 53 mL/min — ABNORMAL LOW (ref >60)
GFR calc non Af Amer: 46 mL/min — ABNORMAL LOW (ref >60)
Glucose, Bld: 115 mg/dL — ABNORMAL HIGH (ref 70–99)
Potassium: 3 mmol/L — ABNORMAL LOW (ref 3.5–5.1)
Sodium: 137 mmol/L (ref 135–145)

## 2019-09-07 LAB — MAGNESIUM: Magnesium: 1.9 mg/dL (ref 1.7–2.4)

## 2019-09-07 LAB — HEPARIN LEVEL (UNFRACTIONATED): Heparin Unfractionated: 0.41 IU/mL (ref 0.30–0.70)

## 2019-09-07 MED ORDER — POTASSIUM CHLORIDE CRYS ER 20 MEQ PO TBCR
40.0000 meq | EXTENDED_RELEASE_TABLET | ORAL | Status: AC
  Administered 2019-09-07 (×2): 40 meq via ORAL
  Filled 2019-09-07 (×2): qty 2

## 2019-09-07 MED ORDER — POTASSIUM CHLORIDE 20 MEQ PO PACK
40.0000 meq | PACK | ORAL | Status: DC
  Administered 2019-09-07: 40 meq via ORAL
  Filled 2019-09-07 (×2): qty 2

## 2019-09-07 MED ORDER — APIXABAN 5 MG PO TABS
5.0000 mg | ORAL_TABLET | Freq: Two times a day (BID) | ORAL | Status: DC
  Administered 2019-09-07 – 2019-09-08 (×2): 5 mg via ORAL
  Filled 2019-09-07 (×2): qty 1

## 2019-09-07 NOTE — Progress Notes (Signed)
Physical Therapy Treatment Patient Details Name: Corey Tyler MRN: [id number] DOB: 1941-10-26 Today's Date: 09/07/2019    History of Present Illness 78 yo male admitted to ED on 10/8 with abdominal pain, and nausea, found to have SBO and has NGT placed (now removed). PMHx:  CHF, cardiomyopathy, COPD, HTN, HLD, CAD, MI, AICD, R pulm nodule, atherosclerosis, a-fib with mult ablations, TEE.    PT Comments    Pt reports not feeling well this morning and slowly starting to feel better this afternoon.  Pt ambulated in hallway however shorter distance ("don't want to push it") and sat in recliner upon return to room as multiple student nurses changing bed linen.   Follow Up Recommendations  Home health PT;Supervision for mobility/OOB     Equipment Recommendations  None recommended by PT(pt going through [place] for equipment)    Recommendations for Other Services       Precautions / Restrictions Precautions Precautions: Fall Precaution Comments: appears to be on 3-6L O2 chronically?    Mobility  Bed Mobility               General bed mobility comments: pt sitting EOB on arrival with spouse and left in recliner  Transfers Overall transfer level: Needs assistance Equipment used: Rolling walker (2 wheeled) Transfers: Sit to/from Stand Sit to Stand: Min guard         General transfer comment: min/guard for safety, cues for hand placement  Ambulation/Gait Ambulation/Gait assistance: Min guard Gait Distance (Feet): 80 Feet Assistive device: Rolling walker (2 wheeled) Gait Pattern/deviations: Step-through pattern;Decreased stride length;Trunk flexed     General Gait Details: min guard for safety, pt remained on 3L O2 [place] during ambulation. Verbal cuing for upright posture.   Stairs             Wheelchair Mobility    Modified Rankin (Stroke Patients Only)       Balance                                            Cognition  Arousal/Alertness: Awake/alert Behavior During Therapy: WFL for tasks assessed/performed Overall Cognitive Status: Within Functional Limits for tasks assessed                                        Exercises      General Comments        Pertinent Vitals/Pain Pain Assessment: No/denies pain    Home Living                      Prior Function            PT Goals (current goals can now be found in the care plan section) Progress towards PT goals: Progressing toward goals    Frequency    Min 3X/week      PT Plan Current plan remains appropriate    Co-evaluation              AM-PAC PT "6 Clicks" Mobility   Outcome Measure    Help needed moving from lying on your back to sitting on the side of a flat bed without using bedrails?: A Little Help needed moving to and from a bed to a chair (including a wheelchair)?: A Little Help needed standing up  from a chair using your arms (e.g., wheelchair or bedside chair)?: A Little Help needed to walk in hospital room?: A Little Help needed climbing 3-5 steps with a railing? : A Little 6 Click Score: 15    End of Session Equipment Utilized During Treatment: Oxygen;Gait belt Activity Tolerance: Patient tolerated treatment well Patient left: with call bell/phone within reach;in chair;with nursing/sitter in room;with family/visitor present   PT Visit Diagnosis: Other abnormalities of gait and mobility (R26.89);Unsteadiness on feet (R26.81)     Time: [id number] PT Time Calculation (min) (ACUTE ONLY): 14 min  Charges:  $Gait Training: 8-22 mins                     Carmelia Bake, PT, DPT Acute Rehabilitation Services Office: [phone] Pager: [phone]    Trena Platt 09/07/2019, 4:23 PM

## 2019-09-07 NOTE — Progress Notes (Signed)
OT Cancellation Note  Patient Details Name: Corey Tyler MRN: [id number] DOB: 02/25/1941   Cancelled Treatment:    Reason Eval/Treat Not Completed: Other (comment). Pt declined OT session stating that he is having a rougher day today. Pt cites medication as cause for fatigue and feeling generally weak today. OT to reattempt on a different day.   Tyrone Schimke, OT Acute Rehabilitation Services Pager: [phone] Office: [phone]  09/07/2019, 11:16 AM

## 2019-09-07 NOTE — Progress Notes (Signed)
Patient ID: Corey Tyler, male   DOB: 05/21/1941, 78 y.o.   MRN: [id number]       Subjective: Patient feels well today.  No abdominal pain.  Tolerated full liquids well.  No nausea.  Continues to have bowel function.  Objective: Vital signs in last 24 hours: Temp:  [97.9 F (36.6 C)-98.1 F (36.7 C)] 97.9 F (36.6 C) (10/15 0435) Pulse Rate:  [59-75] 59 (10/15 0435) Resp:  [16-18] 18 (10/15 0435) BP: (106-122)/(50-68) 111/66 (10/15 0435) SpO2:  [92 %-98 %] 96 % (10/15 0435) Last BM Date: 09/06/19  Intake/Output from previous day: 10/14 0701 - 10/15 0700 In: 360 [P.O.:360] Out: 550 [Urine:550] Intake/Output this shift: Total I/O In: 120 [P.O.:120] Out: 200 [Urine:200]  PE: Abd: soft, NT, ND, +BS  Lab Results:  Recent Labs    09/06/19 0643 09/07/19 0536  WBC 5.3 6.0  HGB 10.9* 11.1*  HCT 33.4* 33.8*  PLT 162 160   BMET Recent Labs    09/06/19 0643 09/07/19 0536  NA 135 137  K 3.3* 3.0*  CL 98 95*  CO2 28 30  GLUCOSE 99 115*  BUN 12 8  CREATININE 1.46* 1.44*  CALCIUM 8.1* 8.4*   PT/INR No results for input(s): LABPROT, INR in the last 72 hours. CMP     Component Value Date/Time   NA 137 09/07/2019 0536   NA 141 01/06/2018 1456   K 3.0 (L) 09/07/2019 0536   CL 95 (L) 09/07/2019 0536   CO2 30 09/07/2019 0536   GLUCOSE 115 (H) 09/07/2019 0536   BUN 8 09/07/2019 0536   BUN 21 01/06/2018 1456   CREATININE 1.44 (H) 09/07/2019 0536   CALCIUM 8.4 (L) 09/07/2019 0536   PROT 5.2 (L) 09/01/2019 0539   ALBUMIN 2.8 (L) 09/06/2019 0643   AST 17 09/01/2019 0539   ALT 18 09/01/2019 0539   ALKPHOS 48 09/01/2019 0539   BILITOT 0.9 09/01/2019 0539   GFRNONAA 46 (L) 09/07/2019 0536   GFRAA 53 (L) 09/07/2019 0536   Lipase     Component Value Date/Time   LIPASE 17 08/31/2019 0421       Studies/Results: Dg Abd 2 Views  Result Date: 09/05/2019 CLINICAL DATA:  Abdominal pain and nausea. Small-bowel obstruction. EXAM: ABDOMEN - 2 VIEW COMPARISON:   09/04/2019 FINDINGS: Patient continues to show some dilated gas-filled small intestine consistent with partial small bowel obstruction. The pattern has not worsened since yesterday. No sign of free air. No nasogastric tube evident. IMPRESSION: Persistent partial small bowel obstruction pattern. No free air. No nasogastric tube evident. Persistent small bowel partial obstruction pattern, similar to yesterday. Electronically Signed   By: Nelson Chimes M.D.   On: 09/05/2019 16:34    Anti-infectives: Anti-infectives (From admission, onward)   None       Assessment/Plan HTN HLD COPD on home O2 CHF NICM CAD ICD SOB A. Fib on Elqius - ok for resumption of eliquis  SBO -seems to be resolving, adv to soft diet today -if tolerates, can likely DC home tomorrow  FEN -soft diet VTE -SCDs, heparin, ok for eliquis ID -None   LOS: 7 days    Henreitta Cea , [facility] Surgery 09/07/2019, 10:14 AM Please see Amion for pager number during day hours 7:00am-4:30pm

## 2019-09-07 NOTE — Progress Notes (Signed)
[place] for IV heparin Indication: atrial fibrillation (while Apixaban on hold)  Allergies  Allergen Reactions  . Xarelto [Rivaroxaban] Other (See Comments)    Bleeding  . Adhesive [Tape] Hives, Itching and Rash  . Atorvastatin Cough  . Codeine Nausea Only  . Isosorbide Nitrate Other (See Comments)    Made him feel bad  . Latex Itching and Other (See Comments)    Rash, itching, burning  . Levofloxacin Other (See Comments)    Tachycardia  . Lisinopril Cough  . Lorazepam Other (See Comments)    "felt bad"  . Mexiletine Other (See Comments)    GI distress  . Sertraline Other (See Comments)    Made him feel bad   Patient Measurements: Height: 6\' 1"  (185.4 cm) Weight: 227 lb (103 kg) IBW/kg (Calculated) : Corey.9 Heparin Dosing Weight: 100.8 kg  Vital Signs: Temp: 97.9 F (36.6 C) (10/15 0435) Temp Source: Oral (10/15 0435) BP: 111/66 (10/15 0435) Pulse Rate: 59 (10/15 0435)  Labs: Recent Labs    09/05/19 0532 09/05/19 1712 09/06/19 0643 09/07/19 0536  HGB 11.2*  --  10.9* 11.1*  HCT 34.8*  --  33.4* 33.8*  PLT 165  --  162 160  APTT 105*  --   --   --   HEPARINUNFRC 0.27* 0.41 0.38 0.41  CREATININE 1.14  --  1.46* 1.44*   Estimated Creatinine Clearance: 52.4 mL/min (A) (by C-G formula based on SCr of 1.44 mg/dL (H)).  Medications:  PTA Apixaban 5mg  PO BID-last dose reported as 08/30/2019 at 2030  Assessment: Corey Tyler on Apixaban PTA for atrial fibrillation admitted with chief complaint of abdominal pain and nausea, found to have small bowel obstruction. Pharmacy consulted for IV heparin dosing while Apixaban on hold. CBC: Hgb 12.8, Pltc WNL at 171K. Note, recent Apixaban use can falsely elevate heparin levels.   Heparin started 10/8 at 1500 units/hr with no bolus due to recent Apixaban  Anticipate baseline heparin level to be falsely elevated from recent Apixaban. Therefore, will use aPTT for heparin dosing/titration until  effects of Apixaban on heparin level have diminished. Then will use heparin levels for dosing.  Today, 09/07/2019  Hep level in therapeutic range 0.41 units/ml  CBC: Hgb low,stable  Plt wnl  No bleeding or infusion related issues reported by RN Diet adv to FL, no surgical intervention if continues to improve  Goal of Therapy:  Heparin level 0.3-0.7 units/ml  APTT 66-102 seconds Monitor platelets by anticoagulation protocol: Yes   Plan:   Continue Heparin infusion at 1400 units/hr  Anticipate transition back to Eliquis today per [facility] note  Daily CBC, heparin level,   Monitor closely for s/sx of bleeding  Minda Ditto PharmD Pager [phone] 09/07/2019, 6:42 AM

## 2019-09-07 NOTE — Progress Notes (Signed)
[facility]  PROGRESS NOTE  Corey Tyler [id number] DOB: June 13, 1941 DOA: 08/31/2019 PCP: [name], MD  Brief History    Corey Tyler is a 78 y.o. year old male with medical history significant for CHF with reduced EF status post ICD, A. fib, chronic hypoxic respiratory failure (2 to 6 L at home), BPH, hypothyroidism who presented on 08/31/2019 with worsening abdominal pain, distentionnausea and lack of flatus and was found to have small bowel obstruction on ct abdomen imaging due to adhesions.  Cardiology evaluated patient preoperative assessment 10/13 he will be acceptable risk for any planned procedure.  Patient is now tolerating oral diet seems SBO is improving  A & P     Small bowel obstruction, improving.  No longer requiring NG tube.  Advance from full to soft diet, surgery ok with resuming home eliquis. Still with flatus.    Paroxysmal A. fib we will.  CHA2DS2-VASc score of 5.  Continue amiodarone, metoprolol XL- holdng due to worsening fatigue and HR in mid 50s, monitor HR. Resume home eliquis   Hypomagnesemia/hypokalemia. In setting of torsemide use. Still low, replete and repeat BMP/Mg in am.   History of VT. ICD in place. Continue amiodarone, holding BB as above    Chronic combined CHF with reduced EF of 25-30% on 07/2019, euvolemic on exam.  Continue home torsemide and metoprolol succinate   Chronic hypoxic respiratory failure secondary to COPD, stable.  Currently on 3 L at rest maintaining normal O2 sats.  Continue home inhalers.   CKD stage III.  Creatinine stable at baseline 1.4-1.6.  Avoid nephrotoxins, monitor BMP.   Hypertension, stable.  Continue beta-blockers   CAD.  History of nonobstructive disease on left heart cath in 2000 and.  Asymptomatic care.  Will resume Ranexa now tolerating p.o.   AAA continue outpatient follow-up.   BPH.Continue Flomax, monitor output.   Hypothyroidism, stable continue Synthroid (transition back to oral  today)    Chronic macrocytic anemia.  Hemoglobin remained stable at 11-12.  No active signs of bleeding..  Closely monitor.     DVT prophylaxis: IV heparin--resume home eliquis Code Status: Full Family Communication:no family at bedside Disposition Plan: Continue to monitor his advance diet     [facility] Direct contact: see [email or web address] (further directions at bottom of note if needed) 7PM-7AM contact night coverage as at bottom of note 09/07/2019, 8:09 PM  LOS: 7 days   Consultants  . Surgery, cardiology  Procedures  . None  Antibiotics  . None  Interval History/Subjective  Feels more tired today. Thinks its related to his metoprolol and flomax  Objective   Vitals:  Vitals:   09/07/19 0435 09/07/19 1311  BP: 111/66 (!) 111/56  Pulse: (!) 59 67  Resp: 18 18  Temp: 97.9 F (36.6 C) 98.9 F (37.2 C)  SpO2: 96% 92%    Exam:  Awake, alert, oriented x3, normal affect No JVD No respiratory effort on 4 L O2 Irregularly irregular rhythm, normal rate, no peripheral edema Distended abdomen, soft, not tender with palpation, bowel sounds heard   I have personally reviewed the following:   Data Reviewed: Basic Metabolic Panel: Recent Labs  Lab 09/01/19 0539 09/02/19 0625 09/03/19 0548 09/04/19 0531 09/05/19 0532 09/06/19 0643 09/07/19 0536  NA 134* 134* 133* 136 135 135 137  K 3.4* 3.9 4.0 3.9 3.7 3.3* 3.0*  CL 95* 97* 98 101 101 98 95*  CO2 28 28 25 22 24 28 30   GLUCOSE 94 76 70 54*  80 99 115*  BUN 15 17 14 15 12 12 8   CREATININE 1.19 1.37* 1.22 1.08 1.14 1.46* 1.44*  CALCIUM 8.2* 8.2* 8.2* 8.2* 8.1* 8.1* 8.4*  MG 2.3 2.4  --   --   --  1.9 1.9  PHOS  --   --   --  3.1  --  3.8  --    Liver Function Tests: Recent Labs  Lab 09/01/19 0539 09/04/19 0531 09/06/19 0643  AST 17  --   --   ALT 18  --   --   ALKPHOS 48  --   --   BILITOT 0.9  --   --   PROT 5.2*  --   --   ALBUMIN 2.9* 2.6* 2.8*   No results for input(s): LIPASE, AMYLASE in  the last 168 hours. No results for input(s): AMMONIA in the last 168 hours. CBC: Recent Labs  Lab 09/03/19 0548 09/04/19 0531 09/05/19 0532 09/06/19 0643 09/07/19 0536  WBC 6.9 5.6 5.1 5.3 6.0  HGB 12.5* 11.0* 11.2* 10.9* 11.1*  HCT 37.8* 34.1* 34.8* 33.4* 33.8*  MCV 98.7 100.9* 100.9* 101.5* 99.7  PLT 148* 159 165 162 160   Cardiac Enzymes: No results for input(s): CKTOTAL, CKMB, CKMBINDEX, TROPONINI in the last 168 hours. BNP (last 3 results) Recent Labs    04/13/19 1506 07/29/19 1514  BNP 111.1* 66.6    ProBNP (last 3 results) No results for input(s): PROBNP in the last 8760 hours.  CBG: No results for input(s): GLUCAP in the last 168 hours.  Recent Results (from the past 240 hour(s))  SARS CORONAVIRUS 2 (TAT 6-24 HRS) Nasopharyngeal Nasopharyngeal Swab     Status: None   Collection Time: 08/31/19 10:05 AM   Specimen: Nasopharyngeal Swab  Result Value Ref Range Status   SARS Coronavirus 2 NEGATIVE NEGATIVE Final    Comment: (NOTE) SARS-CoV-2 target nucleic acids are NOT DETECTED. The SARS-CoV-2 RNA is generally detectable in upper and lower respiratory specimens during the acute phase of infection. Negative results do not preclude SARS-CoV-2 infection, do not rule out co-infections with other pathogens, and should not be used as the sole basis for treatment or other patient management decisions. Negative results must be combined with clinical observations, patient history, and epidemiological information. The expected result is Negative. Fact Sheet for Patients: SugarRoll.be Fact Sheet for Healthcare Providers: https://www.woods-mathews.com/ This test is not yet approved or cleared by the [place] FDA and  has been authorized for detection and/or diagnosis of SARS-CoV-2 by FDA under an Emergency Use Authorization (EUA). This EUA will remain  in effect (meaning this test can be used) for the duration of the  COVID-19 declaration under Section 56 4(b)(1) of the Act, 21 U.S.C. section 360bbb-3(b)(1), unless the authorization is terminated or revoked sooner. Performed at [facility], [place] [address] [place], [place] [address]      Studies: No results found.  Scheduled Meds: . amiodarone  200 mg Oral Daily  . bisacodyl  10 mg Rectal Daily  . levothyroxine  125 mcg Oral Q0600  . lip balm  1 application Topical BID  . mouth rinse  15 mL Mouth Rinse BID  . mometasone-formoterol  2 puff Inhalation BID  . sodium chloride flush  3 mL Intravenous Q12H  . tamsulosin  0.4 mg Oral QHS  . torsemide  20 mg Oral Q MTWThF  . [START ON 09/09/2019] torsemide  30 mg Oral Once per day on Sun Sat  . umeclidinium bromide  1 puff Inhalation Daily   Continuous Infusions: . famotidine (PEPCID) IV 20 mg (09/07/19 1143)  . heparin 1,400 Units/hr (09/06/19 2350)  . ondansetron (ZOFRAN) IV      Principal Problem:   SBO (small bowel obstruction) (HCC) Active Problems:   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   Chronic systolic heart failure (HCC)   Cardiomyopathy, nonischemic and ischemic   Cardiac defibrillator -dual  [place]   AF (paroxysmal atrial fibrillation) (HCC)   Benign fibroma of prostate   Chronic respiratory failure with hypoxia (HCC)   Chronic anticoagulation   Hypothyroidism   Chronic nausea   CKD (chronic kidney disease) stage 3, GFR 30-59 ml/min      Desiree Hane  [facility]

## 2019-09-08 DIAGNOSIS — K5652 Intestinal adhesions [bands] with complete obstruction: Secondary | ICD-10-CM

## 2019-09-08 DIAGNOSIS — J438 Other emphysema: Secondary | ICD-10-CM

## 2019-09-08 DIAGNOSIS — Z95810 Presence of automatic (implantable) cardiac defibrillator: Secondary | ICD-10-CM

## 2019-09-08 MED ORDER — METOPROLOL TARTRATE 50 MG PO TABS: ORAL_TABLET | ORAL | 0 refills | Status: DC

## 2019-09-08 MED ORDER — FAMOTIDINE 20 MG PO TABS
20.0000 mg | ORAL_TABLET | Freq: Every day | ORAL | Status: DC
  Administered 2019-09-08: 10:00:00 20 mg via ORAL
  Filled 2019-09-08 (×2): qty 1

## 2019-09-08 NOTE — Progress Notes (Signed)
Patient ID: IDEN MASSETT, male   DOB: 1941-03-06, 78 y.o.   MRN: [id number]       Subjective: Up using the restroom.  Had a large BM. Tolerating solid diet with no issues.  Denies abdominal pain.  Objective: Vital signs in last 24 hours: Temp:  [97.9 F (36.6 C)-98.9 F (37.2 C)] 97.9 F (36.6 C) (10/16 0455) Pulse Rate:  [67-73] 73 (10/16 0455) Resp:  [16-18] 16 (10/16 0455) BP: (111-122)/(56-69) 120/61 (10/16 0455) SpO2:  [91 %-93 %] 93 % (10/16 0455) Last BM Date: 09/07/19  Intake/Output from previous day: 10/15 0701 - 10/16 0700 In: 840 [P.O.:840] Out: 500 [Urine:500] Intake/Output this shift: No intake/output data recorded.  PE: Abd: soft, NT, Nd, +BS  Lab Results:  Recent Labs    09/06/19 0643 09/07/19 0536  WBC 5.3 6.0  HGB 10.9* 11.1*  HCT 33.4* 33.8*  PLT 162 160   BMET Recent Labs    09/06/19 0643 09/07/19 0536  NA 135 137  K 3.3* 3.0*  CL 98 95*  CO2 28 30  GLUCOSE 99 115*  BUN 12 8  CREATININE 1.46* 1.44*  CALCIUM 8.1* 8.4*   PT/INR No results for input(s): LABPROT, INR in the last 72 hours. CMP     Component Value Date/Time   NA 137 09/07/2019 0536   NA 141 01/06/2018 1456   K 3.0 (L) 09/07/2019 0536   CL 95 (L) 09/07/2019 0536   CO2 30 09/07/2019 0536   GLUCOSE 115 (H) 09/07/2019 0536   BUN 8 09/07/2019 0536   BUN 21 01/06/2018 1456   CREATININE 1.44 (H) 09/07/2019 0536   CALCIUM 8.4 (L) 09/07/2019 0536   PROT 5.2 (L) 09/01/2019 0539   ALBUMIN 2.8 (L) 09/06/2019 0643   AST 17 09/01/2019 0539   ALT 18 09/01/2019 0539   ALKPHOS 48 09/01/2019 0539   BILITOT 0.9 09/01/2019 0539   GFRNONAA 46 (L) 09/07/2019 0536   GFRAA 53 (L) 09/07/2019 0536   Lipase     Component Value Date/Time   LIPASE 17 08/31/2019 0421       Studies/Results: No results found.  Anti-infectives: Anti-infectives (From admission, onward)   None       Assessment/Plan HTN HLD COPD on home O2 CHF NICM CAD ICD SOB A. Fib on Elqius    SBO -tolerating solid diet -stable for Dc home from surgical standpoint. -he may follow up with PCP as needed -we will sign off if patient remains inpatient for other medical needs  FEN -soft diet VTE -SCDs, eliquis ID -None   LOS: 8 days    Henreitta Cea , [facility] 09/08/2019, 8:09 AM Please see Amion for pager number during day hours 7:00am-4:30pm

## 2019-09-08 NOTE — TOC Transition Note (Signed)
Transition of Care [facility]) - CM/SW Discharge Note   Patient Details  Name: Corey Tyler MRN: [id number] Date of Birth: 10/07/1941  [facility] CM/SW Contact:  Trish Mage, LCSW Phone Number: 09/08/2019, 2:02 PM   Clinical Narrative:   Pt to discharge today.  Wife is at bedside, and will transport home.  I explained to patient that I was unable to get any [facility] services set up due to challenges with his HTA insurance, and so I have sent paperwork to his PCP at the [place] to review for authorization.  Santiago Glad with [facility] has agreed to work with patient once authorization is granted.  I also gave patient contact information for social worker at [place] to help him navigate that system as he has also ordered DME from them as well.  TOC sign off.    Final next level of care: [place] Barriers to Discharge: No Barriers Identified   Patient Goals and CMS Choice Patient states their goals for this hospitalization and ongoing recovery are:: "I wish I could travel to the [place]."      Discharge Placement                       Discharge Plan and Services   Discharge Planning Services: CM Consult Post Acute Care Choice: [place]: [place] ([place]) Date [place]: 09/04/19 Time [place]: [place] Representative spoke with at [place]: [place] ([place]) Interventions     Readmission Risk Interventions No flowsheet data found.

## 2019-09-08 NOTE — Discharge Instructions (Signed)
Bowel Obstruction °A bowel obstruction means that something is blocking the small or large bowel. The bowel is also called the intestine. It is the long tube that connects the stomach to the opening of the butt (anus). When something blocks the bowel, food and fluids cannot pass through like normal. This condition needs to be treated. Treatment depends on the cause of the problem and how bad the problem is. °What are the causes? °Common causes of this condition include: °· Scar tissue (adhesions) from past surgery or from high-energy X-rays (radiation). °· Recent surgery in the belly. This affects how food moves in the bowel. °· Some diseases, such as: °? Irritation of the lining of the digestive tract (Crohn's disease). °? Irritation of small pouches in the bowel (diverticulitis). °· Growths or tumors. °· A bulging organ (hernia). °· Twisting of the bowel (volvulus). °· A foreign body. °· Slipping of a part of the bowel into another part (intussusception). °What are the signs or symptoms? °Symptoms of this condition include: °· Pain in the belly. °· Feeling sick to your stomach (nauseous). °· Throwing up (vomiting). °· Bloating in the belly. °· Being unable to pass gas. °· Trouble pooping (constipation). °· Watery poop (diarrhea). °· A lot of belching. °How is this diagnosed? °This condition may be diagnosed based on: °· A physical exam. °· Medical history. °· Imaging tests, such as X-ray or CT scan. °· Blood tests. °· Urine tests. °How is this treated? °Treatment for this condition may include: °· Fluids and pain medicines that are given through an IV tube. Your doctor may tell you not to eat or drink if you feel sick to your stomach and are throwing up. °· Eating a clear liquid diet for a few days. °· Putting a small tube (nasogastric tube) into the stomach. This will help with pain, discomfort, and nausea by removing blocked air and fluids from the stomach. °· Surgery. This may be needed if other treatments do  not work. °Follow these instructions at home: °Medicines °· Take over-the-counter and prescription medicines only as told by your doctor. °· If you were prescribed an antibiotic medicine, take it as told by your doctor. Do not stop taking the antibiotic even if you start to feel better. °General instructions °· Follow your diet as told by your doctor. You may need to: °? Only drink clear liquids until you start to get better. °? Avoid solid foods. °· Return to your normal activities as told by your doctor. Ask your doctor what activities are safe for you. °· Do not sit for a long time without moving. Get up to take short walks every 1-2 hours. This is important. Ask for help if you feel weak or unsteady. °· Keep all follow-up visits as told by your doctor. This is important. °How is this prevented? °After having a bowel obstruction, you may be more likely to have another. You can do some things to stop it from happening again. °· If you have a long-term (chronic) disease, contact your doctor if you see changes or problems. °· Take steps to prevent or treat trouble pooping. Your doctor may ask that you: °? Drink enough fluid to keep your pee (urine) pale yellow. °? Take over-the-counter or prescription medicines. °? Eat foods that are high in fiber. These include beans, whole grains, and fresh fruits and vegetables. °? Limit foods that are high in fat and sugar. These include fried or sweet foods. °· Stay active. Ask your doctor which exercises are   safe for you. °· Avoid stress. °· Eat three small meals and three small snacks each day. °· Work with a food expert (dietitian) to make a meal plan that works for you. °· Do not use any products that contain nicotine or tobacco, such as cigarettes and e-cigarettes. If you need help quitting, ask your doctor. °Contact a doctor if: °· You have a fever. °· You have chills. °Get help right away if: °· You have pain or cramps that get worse. °· You throw up blood. °· You are  sick to your stomach. °· You cannot stop throwing up. °· You cannot drink fluids. °· You feel mixed up (confused). °· You feel very thirsty (dehydrated). °· Your belly gets more bloated. °· You feel weak or you pass out (faint). °Summary °· A bowel obstruction means that something is blocking the small or large bowel. °· Treatment may include IV fluids and pain medicine. You may also have a clear liquid diet, a small tube in your stomach, or surgery. °· Drink clear liquids and avoid solid foods until you get better. °This information is not intended to replace advice given to you by your health care provider. Make sure you discuss any questions you have with your health care provider. °Document Released: 12/17/2004 Document Revised: 03/23/2018 Document Reviewed: 03/23/2018 °Elsevier Patient Education © 2020 [employer] ° °

## 2019-09-08 NOTE — Progress Notes (Signed)
The patient is receiving Pepcid by the intravenous route.  Based on criteria approved by the Pharmacy and [place], the medication is being converted to the equivalent oral dose form.  These criteria include: -No active GI bleeding -Able to tolerate diet of full liquids (or better) or tube feeding -Able to tolerate other medications by the oral or enteral route  If you have any questions about this conversion, please contact the Pharmacy Department (phone [phone]).  Thank you.  Minda Ditto PharmD Pager [phone] 09/08/2019, 8:37 AM

## 2019-09-08 NOTE — Discharge Summary (Signed)
Corey Tyler [id number] DOB: 1941-10-18 DOA: 08/31/2019  PCP: [name], MD  Admit date: 08/31/2019 Discharge date: 09/08/2019  Admitted From: Home Disposition: Home  Recommendations for Outpatient Follow-up:  1. Follow up with PCP in 1-2 weeks.  Advise follow-up with cardiology as well given patient opted to discontinue beta-blocker due to feelings of lethargy 2. Advise BMP check on PCP follow-up in one week 3. Please follow up on the following pending results:  Home Health: PT, RN Equipment/Devices: Continue home oxygen 3 L - 6 L Discharge Condition: Stable CODE STATUS: Full Diet recommendation: Heart Healthy   Brief/Interim Summary: History of present illness:   Corey Tyler is a 78 y.o. year old male with medical history significant for CHF with reduced EF status post ICD, A. fib, chronic hypoxic respiratory failure (2 to 6 L at home), BPH, hypothyroidism who presented on 08/31/2019 with worsening abdominal pain, distention, nausea and lack of flatus and was found to have small bowel obstruction on ct abdomen imaging due to adhesions.  Remaining hospital course addressed in problem based format below:   Hospital Course:    Small bowel obstruction secondary to adhesions, resolved.  Surgery assisted with management with NG tube, bowel rest followed by slow advancement of diet with SBO eventually resolving.  Having normal BMs and flatus and tolerating normal diet.  Continue regular diet on discharge. No longer requiring NG tube.     Paroxysmal A. fib rate controlled.  CHA2DS2-VASc score of 5.  Continue amiodarone, patient has not tolerated metoprolol due to persistent fatigue/lethargy.  Discontinued in hospital with noted improvement in lethargy/heart rate.  Advised patient to update cardiologist.  Was briefly on IV heparin while n.p.o., tolerated resume home Eliquis.     Hypomagnesemia/hypokalemia In setting of torsemide use.  Repleted in hospital, continue daily potassium  on discharge.  Advise BMP check on PCP follow-up.     History of VT. ICD in place. Continue amiodarone, holding BB as above    Chronic combined CHF with reduced EF of 25-30% on 07/2019, euvolemic on exam.  Continue home torsemide.  Patient opted to discontinue beta-blocker as he attributed it to worsening lethargy/fatigue -had been previously discussed with outpatient cardiologist, advised patient to update home cardiologist.   Chronic hypoxic respiratory failure secondary to COPD, stable.  Currently on 3 L at rest maintaining normal O2 sats.  Continue home inhalers.   CKD stage III.  Creatinine stable at baseline 1.4-1.6.  Avoid nephrotoxins   Hypertension, stable.    Patient have to discontinue beta-blocker as mentioned above.   CAD.  History of nonobstructive disease on left heart cath in 2000 and.  Asymptomatic.  Home Ranexa   Infrarenal AAA.  3.1 cm, unchanged.  Continue outpatient follow-up.   BPH.Continue Flomax, monitor output.   Hypothyroidism, stable continue Synthroid (transition back to oral today)    Chronic macrocytic anemia.  Hemoglobin remained stable at 11-12.  No active signs of bleeding.     Consultations:  Surgery, cardiology  Procedures/Studies: None Subjective: Feels much better No belly pain Having normal BMs, flatus Tolerating diet Discharge Exam: Vitals:   09/07/19 2041 09/08/19 0455  BP: 122/69 120/61  Pulse: 69 73  Resp: 16 16  Temp: 97.9 F (36.6 C) 97.9 F (36.6 C)  SpO2: 91% 93%   Vitals:   09/07/19 0435 09/07/19 1311 09/07/19 2041 09/08/19 0455  BP: 111/66 (!) 111/56 122/69 120/61  Pulse: (!) 59 67 69 73  Resp: 18 18 16 16   Temp: 97.9  F (36.6 C) 98.9 F (37.2 C) 97.9 F (36.6 C) 97.9 F (36.6 C)  TempSrc: Oral Oral Oral   SpO2: 96% 92% 91% 93%  Weight:      Height:        General: Lying in bed, no apparent distress Eyes: EOMI, anicteric ENT: Oral Mucosa clear and moist Cardiovascular: Irregularly  irregular rhythm, normal rate, no murmurs, rubs or gallops, no edema, Respiratory: Normal respiratory effort on 2 L nasal cannula, lungs clear to auscultation bilaterally Abdomen: soft, non-distended, non-tender, normal bowel sounds Skin: No Rash Neurologic: Grossly no focal neuro deficit.Mental status AAOx3, speech normal, Psychiatric:Appropriate affect, and mood  Discharge Diagnoses:  Principal Problem:   SBO (small bowel obstruction) (HCC) Active Problems:   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   Chronic systolic heart failure (HCC)   Cardiomyopathy, nonischemic and ischemic   Cardiac defibrillator -dual  [employer] [facility]   AF (paroxysmal atrial fibrillation) (HCC)   Benign fibroma of prostate   Chronic respiratory failure with hypoxia (HCC)   Chronic anticoagulation   Hypothyroidism   Chronic nausea   CKD (chronic kidney disease) stage 3, GFR 30-59 ml/min   Hypomagnesemia   Hypokalemia    Discharge Instructions  Discharge Instructions    Diet - low sodium heart healthy   Complete by: As directed    Increase activity slowly   Complete by: As directed      Allergies as of 09/08/2019      Reactions   Xarelto [rivaroxaban] Other (See Comments)   Bleeding   Adhesive [tape] Hives, Itching, Rash   Atorvastatin Cough   Codeine Nausea Only   Isosorbide Nitrate Other (See Comments)   Made him feel bad   Latex Itching, Other (See Comments)   Rash, itching, burning   Levofloxacin Other (See Comments)   Tachycardia   Lisinopril Cough   Lorazepam Other (See Comments)   "felt bad"   Mexiletine Other (See Comments)   GI distress   Sertraline Other (See Comments)   Made him feel bad      Medication List    TAKE these medications   acetaminophen 500 MG tablet Commonly known as: TYLENOL Take 500 mg by mouth every 6 (six) hours as needed for headache (pain).   Advair Diskus 250-50 MCG/DOSE Aepb Generic drug: Fluticasone-Salmeterol Inhale 1 puff  into the lungs 2 (two) times daily as needed (shortness of breath/wheezing).   albuterol 108 (90 Base) MCG/ACT inhaler Commonly known as: VENTOLIN HFA Inhale 2 puffs into the lungs every 6 (six) hours as needed for wheezing or shortness of breath.   albuterol (2.5 MG/3ML) 0.083% nebulizer solution Commonly known as: PROVENTIL Take 2.5 mg by nebulization every 6 (six) hours as needed for wheezing or shortness of breath.   amiodarone 200 MG tablet Commonly known as: PACERONE Take 2 tablets (400 mg total) by mouth daily. Take 400mg  by mouth daily for 1 month, then resume 200mg  by mouth daily dose. What changed:   how much to take  additional instructions   cholecalciferol 1000 units tablet Commonly known as: VITAMIN D Take 1,000 Units by mouth daily.   Eliquis 5 MG Tabs tablet Generic drug: apixaban TAKE 1 TABLET BY MOUTH TWICE A DAY What changed: how much to take   finasteride 5 MG tablet Commonly known as: PROSCAR Take 5 mg by mouth daily.   Fish Oil 500 MG Caps Take 500 mg by mouth 2 (two) times daily.   Flomax 0.4 MG Caps capsule  Generic drug: tamsulosin Take 0.4 mg by mouth at bedtime.   fluticasone 50 MCG/ACT nasal spray Commonly known as: FLONASE Place 1 spray into both nostrils daily as needed for allergies.   levothyroxine 125 MCG tablet Commonly known as: SYNTHROID Take 125 mcg by mouth daily.   loratadine 10 MG tablet Commonly known as: CLARITIN Take 10 mg by mouth daily as needed for allergies.   LUBRICATING EYE DROPS OP Place 1 drop into both eyes daily as needed (dry eyes).   MAGNESIUM PO Take 1 tablet by mouth at bedtime.   Melatonin 3 MG Tabs Take 3 mg by mouth at bedtime.   METAMUCIL PO Take 1 Dose by mouth daily. Mix in liquid and drink   metoprolol tartrate 50 MG tablet Commonly known as: LOPRESSOR HOLD. Discuss fatigue and symptoms with cardiologist What changed:   how much to take  how to take this  when to take  this  additional instructions   multivitamin with minerals Tabs tablet Take 1 tablet by mouth daily.   nitroGLYCERIN 0.4 MG SL tablet Commonly known as: NITROSTAT Place 0.4 mg under the tongue every 5 (five) minutes as needed for chest pain.   OXYGEN Inhale 3-6 L into the lungs daily. 3 at rest, 4-6 with exertion   pantoprazole 40 MG tablet Commonly known as: PROTONIX Take 40 mg by mouth daily as needed (acid reflux/indigestion).   potassium chloride 10 MEQ tablet Commonly known as: KLOR-CON Take 10 mEq by mouth at bedtime.   promethazine 25 MG tablet Commonly known as: PHENERGAN Take 25 mg by mouth daily as needed for nausea or vomiting.   ranolazine 1000 MG SR tablet Commonly known as: RANEXA Take 1 tablet (1,000 mg total) by mouth 2 (two) times daily.   Spiriva Respimat 2.5 MCG/ACT Aers Generic drug: Tiotropium Bromide Monohydrate Inhale 2 puffs into the lungs every morning.   torsemide 20 MG tablet Commonly known as: DEMADEX Take 1 tablet by mouth once daily What changed:   how much to take  additional instructions      Follow-up Information    [facility] Follow up.   Why: This is the agency that agreed to work with you when the [place] authorizes services.  My contact's name is Granville Lewis.  [facility] name [facility] Dionisio David, and her number is [phone].  She can help you navigate your needs  Contact information: [address] [place], [place] [phone]         Allergies  Allergen Reactions  . Xarelto [Rivaroxaban] Other (See Comments)    Bleeding  . Adhesive [Tape] Hives, Itching and Rash  . Atorvastatin Cough  . Codeine Nausea Only  . Isosorbide Nitrate Other (See Comments)    Made him feel bad  . Latex Itching and Other (See Comments)    Rash, itching, burning  . Levofloxacin Other (See Comments)    Tachycardia  . Lisinopril Cough  . Lorazepam Other (See  Comments)    "felt bad"  . Mexiletine Other (See Comments)    GI distress  . Sertraline Other (See Comments)    Made him feel bad        The results of significant diagnostics from this hospitalization (including imaging, microbiology, ancillary and laboratory) are listed below for reference.     Microbiology: Recent Results (from the past 240 hour(s))  SARS CORONAVIRUS 2 (TAT 6-24 HRS) Nasopharyngeal Nasopharyngeal Swab  Status: None   Collection Time: 08/31/19 10:05 AM   Specimen: Nasopharyngeal Swab  Result Value Ref Range Status   SARS Coronavirus 2 NEGATIVE NEGATIVE Final    Comment: (NOTE) SARS-CoV-2 target nucleic acids are NOT DETECTED. The SARS-CoV-2 RNA is generally detectable in upper and lower respiratory specimens during the acute phase of infection. Negative results do not preclude SARS-CoV-2 infection, do not rule out co-infections with other pathogens, and should not be used as the sole basis for treatment or other patient management decisions. Negative results must be combined with clinical observations, patient history, and epidemiological information. The expected result is Negative. Fact Sheet for Patients: SugarRoll.be Fact Sheet for Healthcare Providers: https://www.woods-mathews.com/ This test is not yet approved or cleared by the [place] FDA and  has been authorized for detection and/or diagnosis of SARS-CoV-2 by FDA under an Emergency Use Authorization (EUA). This EUA will remain  in effect (meaning this test can be used) for the duration of the COVID-19 declaration under Section 56 4(b)(1) of the Act, 21 U.S.C. section 360bbb-3(b)(1), unless the authorization is terminated or revoked sooner. Performed at [facility], [place] [address] [place], [place] [address]      Labs: BNP (last 3 results) Recent Labs    04/13/19 1506 07/29/19 1514  BNP 111.1* [id number]   Basic Metabolic  Panel: Recent Labs  Lab 09/02/19 0625 09/03/19 0548 09/04/19 0531 09/05/19 0532 09/06/19 0643 09/07/19 0536  NA 134* 133* 136 135 135 137  K 3.9 4.0 3.9 3.7 3.3* 3.0*  CL 97* 98 101 101 98 95*  CO2 28 25 22 24 28 30   GLUCOSE 76 70 54* 80 99 115*  BUN 17 14 15 12 12 8   CREATININE 1.37* 1.22 1.08 1.14 1.46* 1.44*  CALCIUM 8.2* 8.2* 8.2* 8.1* 8.1* 8.4*  MG 2.4  --   --   --  1.9 1.9  PHOS  --   --  3.1  --  3.8  --    Liver Function Tests: Recent Labs  Lab 09/04/19 0531 09/06/19 0643  ALBUMIN 2.6* 2.8*   No results for input(s): LIPASE, AMYLASE in the last 168 hours. No results for input(s): AMMONIA in the last 168 hours. CBC: Recent Labs  Lab 09/03/19 0548 09/04/19 0531 09/05/19 0532 09/06/19 0643 09/07/19 0536  WBC 6.9 5.6 5.1 5.3 6.0  HGB 12.5* 11.0* 11.2* 10.9* 11.1*  HCT 37.8* 34.1* 34.8* 33.4* 33.8*  MCV 98.7 100.9* 100.9* 101.5* 99.7  PLT 148* 159 165 162 160   Cardiac Enzymes: No results for input(s): CKTOTAL, CKMB, CKMBINDEX, TROPONINI in the last 168 hours. BNP: Invalid input(s): POCBNP CBG: No results for input(s): GLUCAP in the last 168 hours. D-Dimer No results for input(s): DDIMER in the last 72 hours. Hgb A1c No results for input(s): HGBA1C in the last 72 hours. Lipid Profile No results for input(s): CHOL, HDL, LDLCALC, TRIG, CHOLHDL, LDLDIRECT in the last 72 hours. Thyroid function studies No results for input(s): TSH, T4TOTAL, T3FREE, THYROIDAB in the last 72 hours.  Invalid input(s): FREET3 Anemia work up No results for input(s): VITAMINB12, FOLATE, FERRITIN, TIBC, IRON, RETICCTPCT in the last 72 hours. Urinalysis    Component Value Date/Time   COLORURINE AMBER (A) 08/31/2019 0433   APPEARANCEUR HAZY (A) 08/31/2019 0433   LABSPEC 1.020 08/31/2019 [place] 5.0 08/31/2019 [address] 08/31/2019 [place] 08/31/2019 [place] NEGATIVE 08/31/2019 [place] 08/31/2019 [id number]  PROTEINUR NEGATIVE 08/31/2019 0433   UROBILINOGEN 0.2 07/18/2015 1154   NITRITE NEGATIVE 08/31/2019 0433   LEUKOCYTESUR NEGATIVE 08/31/2019 0433   Sepsis Labs Invalid input(s): PROCALCITONIN,  WBC,  LACTICIDVEN Microbiology Recent Results (from the past 240 hour(s))  SARS CORONAVIRUS 2 (TAT 6-24 HRS) Nasopharyngeal Nasopharyngeal Swab     Status: None   Collection Time: 08/31/19 10:05 AM   Specimen: Nasopharyngeal Swab  Result Value Ref Range Status   SARS Coronavirus 2 NEGATIVE NEGATIVE Final    Comment: (NOTE) SARS-CoV-2 target nucleic acids are NOT DETECTED. The SARS-CoV-2 RNA is generally detectable in upper and lower respiratory specimens during the acute phase of infection. Negative results do not preclude SARS-CoV-2 infection, do not rule out co-infections with other pathogens, and should not be used as the sole basis for treatment or other patient management decisions. Negative results must be combined with clinical observations, patient history, and epidemiological information. The expected result is Negative. Fact Sheet for Patients: SugarRoll.be Fact Sheet for Healthcare Providers: https://www.woods-mathews.com/ This test is not yet approved or cleared by the [place] FDA and  has been authorized for detection and/or diagnosis of SARS-CoV-2 by FDA under an Emergency Use Authorization (EUA). This EUA will remain  in effect (meaning this test can be used) for the duration of the COVID-19 declaration under Section 56 4(b)(1) of the Act, 21 U.S.C. section 360bbb-3(b)(1), unless the authorization is terminated or revoked sooner. Performed at [facility], [place] [address] [place], [place] [address]      Time coordinating discharge: Over 30 minutes  SIGNED:   Desiree Hane, MD  [facility] 09/08/2019, 2:14 PM Pager   If 7PM-7AM, please contact night-coverage [email or web address] Password [email or web address]

## 2019-09-08 NOTE — Progress Notes (Signed)
Pt discharged to home with wife. Discharge instructions and medication changes discussed with pt.

## 2019-09-13 ENCOUNTER — Emergency Department (HOSPITAL_COMMUNITY)
Admission: EM | Admit: 2019-09-13 | Discharge: 2019-09-14 | Disposition: A | Discharge status: Home / Self Care | Payer: No Typology Code available for payment source | Attending: Emergency Medicine | Admitting: Emergency Medicine

## 2019-09-13 ENCOUNTER — Encounter (HOSPITAL_COMMUNITY): Payer: Self-pay

## 2019-09-13 ENCOUNTER — Other Ambulatory Visit: Payer: Self-pay

## 2019-09-13 DIAGNOSIS — I5021 Acute systolic (congestive) heart failure: Secondary | ICD-10-CM | POA: Insufficient documentation

## 2019-09-13 DIAGNOSIS — I4891 Unspecified atrial fibrillation: Secondary | ICD-10-CM | POA: Diagnosis not present

## 2019-09-13 DIAGNOSIS — R531 Weakness: Secondary | ICD-10-CM | POA: Diagnosis not present

## 2019-09-13 DIAGNOSIS — Z95811 Presence of heart assist device: Secondary | ICD-10-CM | POA: Insufficient documentation

## 2019-09-13 DIAGNOSIS — Z91040 Latex allergy status: Secondary | ICD-10-CM | POA: Diagnosis not present

## 2019-09-13 DIAGNOSIS — I252 Old myocardial infarction: Secondary | ICD-10-CM | POA: Insufficient documentation

## 2019-09-13 DIAGNOSIS — R140 Abdominal distension (gaseous): Secondary | ICD-10-CM | POA: Diagnosis not present

## 2019-09-13 DIAGNOSIS — R52 Pain, unspecified: Secondary | ICD-10-CM | POA: Diagnosis not present

## 2019-09-13 DIAGNOSIS — Z87891 Personal history of nicotine dependence: Secondary | ICD-10-CM | POA: Insufficient documentation

## 2019-09-13 DIAGNOSIS — I130 Hypertensive heart and chronic kidney disease with heart failure and stage 1 through stage 4 chronic kidney disease, or unspecified chronic kidney disease: Secondary | ICD-10-CM | POA: Insufficient documentation

## 2019-09-13 DIAGNOSIS — N183 Chronic kidney disease, stage 3 unspecified: Secondary | ICD-10-CM | POA: Diagnosis not present

## 2019-09-13 DIAGNOSIS — E860 Dehydration: Secondary | ICD-10-CM | POA: Diagnosis not present

## 2019-09-13 DIAGNOSIS — R0602 Shortness of breath: Secondary | ICD-10-CM | POA: Diagnosis not present

## 2019-09-13 DIAGNOSIS — R1084 Generalized abdominal pain: Secondary | ICD-10-CM | POA: Diagnosis not present

## 2019-09-13 LAB — URINALYSIS, ROUTINE W REFLEX MICROSCOPIC
Bilirubin Urine: NEGATIVE
Glucose, UA: NEGATIVE mg/dL
Hgb urine dipstick: NEGATIVE
Ketones, ur: NEGATIVE mg/dL
Leukocytes,Ua: NEGATIVE
Nitrite: NEGATIVE
Protein, ur: NEGATIVE mg/dL
Specific Gravity, Urine: 1.009 (ref 1.005–1.030)
pH: 6 (ref 5.0–8.0)

## 2019-09-13 LAB — CBC
HCT: 37.7 % — ABNORMAL LOW (ref 39.0–52.0)
Hemoglobin: 12.4 g/dL — ABNORMAL LOW (ref 13.0–17.0)
MCH: 32.7 pg (ref 26.0–34.0)
MCHC: 32.9 g/dL (ref 30.0–36.0)
MCV: 99.5 fL (ref 80.0–100.0)
Platelets: 249 10*3/uL (ref 150–400)
RBC: 3.79 MIL/uL — ABNORMAL LOW (ref 4.22–5.81)
RDW: 14.4 % (ref 11.5–15.5)
WBC: 6.8 10*3/uL (ref 4.0–10.5)
nRBC: 0 % (ref 0.0–0.2)

## 2019-09-13 LAB — COMPREHENSIVE METABOLIC PANEL
ALT: 23 U/L (ref 0–44)
AST: 19 U/L (ref 15–41)
Albumin: 3.1 g/dL — ABNORMAL LOW (ref 3.5–5.0)
Alkaline Phosphatase: 59 U/L (ref 38–126)
Anion gap: 8 (ref 5–15)
BUN: 13 mg/dL (ref 8–23)
CO2: 28 mmol/L (ref 22–32)
Calcium: 8.5 mg/dL — ABNORMAL LOW (ref 8.9–10.3)
Chloride: 96 mmol/L — ABNORMAL LOW (ref 98–111)
Creatinine, Ser: 1.7 mg/dL — ABNORMAL HIGH (ref 0.61–1.24)
GFR calc Af Amer: 43 mL/min — ABNORMAL LOW (ref >60)
GFR calc non Af Amer: 38 mL/min — ABNORMAL LOW (ref >60)
Glucose, Bld: 99 mg/dL (ref 70–99)
Potassium: 3.9 mmol/L (ref 3.5–5.1)
Sodium: 132 mmol/L — ABNORMAL LOW (ref 135–145)
Total Bilirubin: 0.8 mg/dL (ref 0.3–1.2)
Total Protein: 5.5 g/dL — ABNORMAL LOW (ref 6.5–8.1)

## 2019-09-13 LAB — LIPASE, BLOOD: Lipase: 26 U/L (ref 11–51)

## 2019-09-13 MED ORDER — SODIUM CHLORIDE 0.9% FLUSH
3.0000 mL | Freq: Once | INTRAVENOUS | Status: AC
  Administered 2019-09-14: 3 mL via INTRAVENOUS

## 2019-09-13 NOTE — ED Triage Notes (Signed)
Pt from home with ems for c.o generalized weakness and abd tightness. Pt recently d.c from [facility] Friday for weakness. Pt hypotensice in triage. Wears 3L [place] all the time, extensive cardiac hx. Pt a.o

## 2019-09-14 ENCOUNTER — Emergency Department (HOSPITAL_COMMUNITY): Payer: No Typology Code available for payment source

## 2019-09-14 DIAGNOSIS — R0602 Shortness of breath: Secondary | ICD-10-CM | POA: Diagnosis not present

## 2019-09-14 MED ORDER — SODIUM CHLORIDE 0.9 % IV BOLUS
500.0000 mL | Freq: Once | INTRAVENOUS | Status: AC
  Administered 2019-09-14: 500 mL via INTRAVENOUS

## 2019-09-14 MED ORDER — LACTATED RINGERS IV BOLUS
1000.0000 mL | Freq: Once | INTRAVENOUS | Status: AC
  Administered 2019-09-14: 1000 mL via INTRAVENOUS

## 2019-09-14 NOTE — ED Notes (Signed)
  Patient assisted to stand and use urinal.  Patient tolerated well and was not shaky like he was before fluids.

## 2019-09-14 NOTE — ED Provider Notes (Signed)
Emergency Department Provider Note   I have reviewed the triage vital signs and the nursing notes.   HISTORY  Chief Complaint Weakness   HPI Corey Tyler is a 78 y.o. male with multiple medical problems documented below who presents to the emergency department today with generalized weakness.  Patient states he was in the hospital for small bowel obstruction secondary to adhesions.  It resolved he was doing better he was discharged almost a week ago.  The wife states that his bowel movements are improved but not normal still requires milk of magnesia and suppositories for minimal movement.  He will wake up in the morning very bloated and then get these treatments and will do better.  He had been doing okay but then today felt a little bit weak.  He went to the bathroom and felt generalized weakness the point where when he stood up he did feel like he could do it.  He is also having some shaking of his asked which is similar to previous episodes.  He is going to be worked up for Parkinson's over the next month or 2.  He has not any fevers, nausea, vomiting.  He has decreased urination and decreased intake.  Is compliant with his medications.  No sick contacts.  No focal weakness.   No other associated or modifying symptoms.    Past Medical History:  Diagnosis Date  . A-fib ([place])    a. Noted on 12/2012 interrogation, placed on Apixaban and ultimately stopped NOACs 2/2 personal decision 8/14.  Marland Kitchen AICD (automatic cardioverter/defibrillator) present    Cardiac defibrillator -dual  [employer]  . Arthritis   . Atrial tachycardia-non sustained    a. Noted on 03/2012 interrogation.  . Chronic systolic heart failure (HCC)    EF down to 20 to 25% per echo November 2012  . COPD (chronic obstructive pulmonary disease) ([place])   . Hyperlipidemia   . Hypertension   . ICD (implantable cardiac defibrillator) in place   . NICM (nonischemic cardiomyopathy) ([place])    a. Normal cors 2000. b. Minimal  plaque 2013.  . Old myocardial infarction    a. ?Silent MI. b. Large fixed inferolateral defect c/w with prior infarct, no ischemia. c. Cath in 2000/2013 with only minimal CAD.  Marland Kitchen Presence of permanent cardiac pacemaker   . Pulmonary nodule, right    Last scan in 2010 showing stability; felt to be benign.  Marland Kitchen PVC's (premature ventricular contractions)   . Ventricular tachycardia, polymorphic ([place])    Rx via ICD 12/14    Patient Active Problem List   Diagnosis Date Noted  . Hypomagnesemia 09/07/2019  . Hypokalemia 09/07/2019  . CKD (chronic kidney disease) stage 3, GFR 30-59 ml/min 09/02/2019  . SBO (small bowel obstruction) ([place]) 08/31/2019  . Chronic respiratory failure with hypoxia ([place]) 08/31/2019  . Chronic anticoagulation 08/31/2019  . Hypothyroidism 08/31/2019  . Chronic nausea 08/31/2019  . Generalized weakness 07/29/2019  . Acute respiratory failure ([place]) 09/15/2017  . COPD exacerbation ([place]) 09/15/2017  . Sustained ventricular tachycardia ([place]) 06/10/2017  . VT (ventricular tachycardia) ([place]) 11/02/2016  . Benign fibroma of prostate 04/02/2015  . Ventricular tachycardia, polymorphic ([place])   . Diverticulosis of colon without hemorrhage 09/25/2013  . AF (paroxysmal atrial fibrillation) ([place]) 12/29/2012  . Cardiac defibrillator -dual  [employer]   . Hyperlipidemia   . PVCs 11/26/2011  . Chronic systolic heart failure ([place]) 10/07/2011  . Cardiomyopathy, nonischemic and ischemic 09/24/2011  . COPD (chronic obstructive pulmonary disease)  with emphysema Gold C 12/18/2009  . PULMONARY NODULE 12/04/2008    Past Surgical History:  Procedure Laterality Date  . APPENDECTOMY    . CARDIAC CATHETERIZATION  2000  . CARDIAC DEFIBRILLATOR PLACEMENT    . CARDIOVERSION  03/30/2013   Procedure: CARDIOVERSION;  Surgeon: Thayer Headings, MD;  Location: [facility];  Service: Cardiovascular;;  . COLON SURGERY    . COLONOSCOPY N/A 09/25/2013   Procedure: COLONOSCOPY;  Surgeon: Jerene Bears, MD;  [facility];  Service: Endoscopy;  Laterality: N/A;  . ICD  12/2011   [name]  . IMPLANTABLE CARDIOVERTER DEFIBRILLATOR IMPLANT N/A 01/06/2012   Procedure: IMPLANTABLE CARDIOVERTER DEFIBRILLATOR IMPLANT;  Surgeon: Deboraha Sprang, MD;  Location: [facility] CATH LAB;  Service: Cardiovascular;  Laterality: N/A;  . PACEMAKER IMPLANT     [employer] [name]  . TEE WITHOUT CARDIOVERSION N/A 03/30/2013   Procedure: TRANSESOPHAGEAL ECHOCARDIOGRAM (TEE);  Surgeon: Thayer Headings, MD;  Location: [facility];  Service: Cardiovascular;  Laterality: N/A;  . V TACH ABLATION  06/10/2017  . V TACH ABLATION N/A 06/10/2017   Procedure: V Tach Ablation;  Surgeon: Evans Lance, MD;  Location: [facility] CV LAB;  Service: Cardiovascular;  Laterality: N/A;    Current Outpatient Rx  . Order #: [id number] Class: Historical Med  . Order #: [id number] Class: Historical Med  . Order #: [id number] Class: Historical Med  . Order #: [id number] Class: Normal  . Order #: [id number] Class: Historical Med  . Order #: [id number] Class: Historical Med  . Order #: [id number] Class: Normal  . Order #: [id number] Class: Historical Med  . Order #: [id number] Class: Historical Med  . Order #: [id number] Class: Historical Med  . Order #: [id number] Class: Historical Med  . Order #: [id number] Class: Historical Med  . Order #: [id number] Class: Historical Med  . Order #: [id number] Class: Historical Med  . Order #: [id number] Class: No Print  . Order #: [id number] Class: Historical Med  . Order #: [id number] Class: Historical Med  . Order #: [id number] Class: Historical Med  . Order #: [id number] Class: Historical Med  . Order #: [id number] Class: Historical Med  . Order #: [id number] Class: Historical Med  . Order #: [id number] Class: Historical Med  . Order #: [id number] Class: Historical Med  . Order #: [id number] Class: Normal  . Order #: [id number] Class: Historical Med  . Order #: [id number] Class: Historical Med  . Order #: [id number] Class: Normal    Allergies  Xarelto [rivaroxaban], Adhesive [tape], Atorvastatin, Codeine, Isosorbide nitrate, Latex, Levofloxacin, Lisinopril, Lorazepam, Mexiletine, and Sertraline  Family History  Problem Relation Age of Onset  . Emphysema Mother   . Heart disease Mother        AVR  . Heart failure Father        Died agwe 53  . Esophageal cancer Brother   . Colon cancer Neg Hx     Social History Social History   Tobacco Use  . Smoking status: Former Smoker    Packs/day: 2.50    Years: 40.00    Pack years: 100.00    Types: Cigarettes    Quit date: 11/24/1983    Years since quitting: 35.8  . Smokeless tobacco: Never Used  . Tobacco comment: 2 1/2 ppd x 40 years  Substance Use Topics  . Alcohol use: No  . Drug use: No    Review of Systems  All other systems negative except as documented in the HPI. All pertinent positives and negatives as reviewed in the HPI. ____________________________________________   PHYSICAL EXAM:  VITAL SIGNS: ED Triage Vitals  Enc Vitals Group  BP 09/13/19 1701 95/64     Pulse Rate 09/13/19 1701 72     Resp 09/13/19 1701 18     Temp 09/13/19 1701 98.1 F (36.7 C)     Temp Source 09/13/19 1701 Oral     SpO2 09/13/19 1701 95 %    Constitutional: Alert and oriented. Well appearing and in no acute distress. Eyes: Conjunctivae are normal. PERRL. EOMI. Head: Atraumatic. Nose: No congestion/rhinnorhea. Mouth/Throat: Mucous membranes are moist.  Oropharynx non-erythematous. Neck: No stridor.  No meningeal signs.   Cardiovascular: Normal rate, regular rhythm. Good peripheral circulation. Grossly normal heart sounds.   Respiratory: Normal respiratory effort.  No retractions. Lungs diminished in RLL but otherwise ctab. Gastrointestinal: Soft and nontender. No distention.  Musculoskeletal: No lower extremity tenderness nor edema. No gross deformities of extremities. Neurologic:  Normal speech and language. No gross focal neurologic deficits are appreciated.  Symmetric  strength in dorsiflexion, plantarflexion, leg lift, grip, biceps, triceps and facial movement. Similar sensation in face, hands and feet to light touch. Skin:  Skin is warm, dry and intact. No rash noted.   ____________________________________________   LABS (all labs ordered are listed, but only abnormal results are displayed)  Labs Reviewed  CBC - Abnormal; Notable for the following components:      Result Value   RBC 3.79 (*)    Hemoglobin 12.4 (*)    HCT 37.7 (*)    All other components within normal limits  COMPREHENSIVE METABOLIC PANEL - Abnormal; Notable for the following components:   Sodium 132 (*)    Chloride 96 (*)    Creatinine, Ser 1.70 (*)    Calcium 8.5 (*)    Total Protein 5.5 (*)    Albumin 3.1 (*)    GFR calc non Af Amer 38 (*)    GFR calc Af Amer 43 (*)    All other components within normal limits  URINALYSIS, ROUTINE W REFLEX MICROSCOPIC  LIPASE, BLOOD   ____________________________________________  EKG   EKG Interpretation  Date/Time:  Wednesday September 13 2019 17:07:16 EDT Ventricular Rate:  74 PR Interval:  196 QRS Duration: 110 QT Interval:  426 QTC Calculation: 472 R Axis:   6 Text Interpretation:  Normal sinus rhythm Incomplete left bundle branch block Borderline ECG No significant change since last tracing Confirmed by Merrily Pew [phone]) on 09/13/2019 11:24:23 PM       ____________________________________________  RADIOLOGY  Dg Chest Portable 1 View  Result Date: 09/14/2019 CLINICAL DATA:  Shortness of breath, weakness EXAM: PORTABLE CHEST 1 VIEW COMPARISON:  August 31, 2019 FINDINGS: There is mild cardiomegaly. A left-sided pacemaker seen with the lead tips in the right atrium and right ventricle. There is mildly increased interstitial markings at both lung bases as on the prior exam, likely chronic lung changes. Aortic knob calcifications. No new airspace consolidation or pleural effusion. IMPRESSION: No acute cardiopulmonary  process. Electronically Signed   By: Prudencio Pair M.D.   On: 09/14/2019 01:30    ____________________________________________   PROCEDURES  Procedure(s) performed:   Procedures   ____________________________________________   INITIAL IMPRESSION / ASSESSMENT AND PLAN / ED COURSE  Creatinine is a little bit more elevated than it was previously suggesting possible mild dehydration consistent with his symptoms.  Will check a chest x-ray as he does have decreased breath sounds in his right lower lobe.  Has a bowel obstruction.  No evidence of urinary tract infection.  I do not think this is a acute neurologic emergency as is not  focal.  Will reassess for disposition.  Back to bas[place]ien witih some fluids. He had 1500cc IV but only approximately 250cc output reassurring me he was likely dehydrated. Will reduce total lasix dose. Dc for same w/ cards/ pcp follow up.      Pertinent labs & imaging results that were available during my care of the patient were reviewed by me and considered in my medical decision making (see chart for details).   A medical screening exam was performed and I feel the patient has had an appropriate workup for their chief complaint at this time and likelihood of emergent condition existing is low. They have been counseled on decision, discharge, follow up and which symptoms necessitate immediate return to the emergency department. They or their family verbally stated understanding and agreement with plan and discharged in stable condition.   ____________________________________________  FINAL CLINICAL IMPRESSION(S) / ED DIAGNOSES  Final diagnoses:  Dehydration     MEDICATIONS GIVEN DURING THIS VISIT:  Medications  sodium chloride flush (NS) 0.9 % injection 3 mL (3 mLs Intravenous Given 09/14/19 0103)  lactated ringers bolus 1,000 mL (0 mLs Intravenous Stopped 09/14/19 0259)  sodium chloride 0.9 % bolus 500 mL (0 mLs Intravenous Stopped 09/14/19 0452)      NEW OUTPATIENT MEDICATIONS STARTED DURING THIS VISIT:  Discharge Medication List as of 09/14/2019  4:09 AM      Note:  This note was prepared with assistance of Dragon voice recognition software. Occasional wrong-word or sound-a-like substitutions may have occurred due to the inherent limitations of voice recognition software.   Pailynn Vahey, Corene Cornea, MD 09/14/19 [phone]

## 2019-09-19 DIAGNOSIS — E441 Mild protein-calorie malnutrition: Secondary | ICD-10-CM | POA: Diagnosis not present

## 2019-09-19 DIAGNOSIS — E860 Dehydration: Secondary | ICD-10-CM | POA: Diagnosis not present

## 2019-09-19 DIAGNOSIS — K5901 Slow transit constipation: Secondary | ICD-10-CM | POA: Diagnosis not present

## 2019-10-03 ENCOUNTER — Ambulatory Visit: Payer: No Typology Code available for payment source | Admitting: Cardiology

## 2019-10-18 ENCOUNTER — Ambulatory Visit (INDEPENDENT_AMBULATORY_CARE_PROVIDER_SITE_OTHER): Payer: PPO | Admitting: Neurology

## 2019-10-18 ENCOUNTER — Other Ambulatory Visit: Payer: Self-pay

## 2019-10-18 ENCOUNTER — Encounter: Payer: Self-pay | Admitting: Neurology

## 2019-10-18 DIAGNOSIS — R251 Tremor, unspecified: Secondary | ICD-10-CM

## 2019-10-18 NOTE — Progress Notes (Signed)
Reason for visit: Tremors  Referring physician: Dr. Anastasio Auerbach is a 78 y.o. male  History of present illness:  Mr. Fulton is a 78 year old white male with a history of congestive heart failure with ejection fraction around 25% and a history of COPD with chronic hypoxia.  The patient is on oxygen up to 5 L at times.  The patient has had 2 events since September 2020.  He went to the hospital on 5 September with a prolonged event of polymyoclonus or tremors involving all 4 extremities.  The episode lasted 16 hours before it resolved.  The patient was felt to be dehydrated, he presented with a sodium of 130, potassium was slightly elevated, he was noted to have a slight elevation in the ammonia level to 44.  The patient was given IV fluids and the tremors resolved.  The patient had no loss of consciousness, he may have had some slight confusion with the event.  He was unable to walk because of the tremors or myoclonus.  The patient had a recurrence about a month ago lasting for 4 or 5 hours, he was given IV fluids and the event resolved.  Once again, he was felt to be dehydrated.  CT of the brain showed no acute changes.  The patient has a weak feeling when the episode comes on, he has no numbness or focal weakness.  The patient did not have any fevers or chills.  He reports no changes in medications around the time of onset.  The patient does not recall feeling more short winded around the time of the events.  He is sent to this office for an evaluation.  Past Medical History:  Diagnosis Date  . A-fib ([place])    a. Noted on 12/2012 interrogation, placed on Apixaban and ultimately stopped NOACs 2/2 personal decision 8/14.  Marland Kitchen AICD (automatic cardioverter/defibrillator) present    Cardiac defibrillator -dual  [employer]  . Arthritis   . Atrial tachycardia-non sustained    a. Noted on 03/2012 interrogation.  . Chronic systolic heart failure (HCC)    EF down to 20 to 25% per echo November 2012   . COPD (chronic obstructive pulmonary disease) ([place])   . Hyperlipidemia   . Hypertension   . ICD (implantable cardiac defibrillator) in place   . NICM (nonischemic cardiomyopathy) ([place])    a. Normal cors 2000. b. Minimal plaque 2013.  . Old myocardial infarction    a. ?Silent MI. b. Large fixed inferolateral defect c/w with prior infarct, no ischemia. c. Cath in 2000/2013 with only minimal CAD.  Marland Kitchen Presence of permanent cardiac pacemaker   . Pulmonary nodule, right    Last scan in 2010 showing stability; felt to be benign.  Marland Kitchen PVC's (premature ventricular contractions)   . Ventricular tachycardia, polymorphic ([place])    Rx via ICD 12/14    Past Surgical History:  Procedure Laterality Date  . APPENDECTOMY    . CARDIAC CATHETERIZATION  2000  . CARDIAC DEFIBRILLATOR PLACEMENT    . CARDIOVERSION  03/30/2013   Procedure: CARDIOVERSION;  Surgeon: Thayer Headings, MD;  Location: [facility];  Service: Cardiovascular;;  . COLON SURGERY    . COLONOSCOPY N/A 09/25/2013   Procedure: COLONOSCOPY;  Surgeon: Jerene Bears, MD;  Location: [facility];  Service: Endoscopy;  Laterality: N/A;  . ICD  12/2011   [name]  . IMPLANTABLE CARDIOVERTER DEFIBRILLATOR IMPLANT N/A 01/06/2012   Procedure: IMPLANTABLE CARDIOVERTER DEFIBRILLATOR IMPLANT;  Surgeon: Deboraha Sprang,  MD;  Location: [facility] CATH LAB;  Service: Cardiovascular;  Laterality: N/A;  . PACEMAKER IMPLANT     St [name]  . TEE WITHOUT CARDIOVERSION N/A 03/30/2013   Procedure: TRANSESOPHAGEAL ECHOCARDIOGRAM (TEE);  Surgeon: Thayer Headings, MD;  Location: [facility];  Service: Cardiovascular;  Laterality: N/A;  . V TACH ABLATION  06/10/2017  . V TACH ABLATION N/A 06/10/2017   Procedure: V Tach Ablation;  Surgeon: Evans Lance, MD;  Location: [facility] CV LAB;  Service: Cardiovascular;  Laterality: N/A;    Family History  Problem Relation Age of Onset  . Emphysema Mother   . Heart disease Mother        AVR  . Heart failure Father        Died agwe  29  . Esophageal cancer Brother   . Colon cancer Neg Hx     Social history:  reports that he quit smoking about 35 years ago. His smoking use included cigarettes. He has a 100.00 pack-year smoking history. He has never used smokeless tobacco. He reports that he does not drink alcohol or use drugs.  Medications:  Prior to Admission medications   Medication Sig Start Date End Date Taking? Authorizing Provider  acetaminophen (TYLENOL) 500 MG tablet Take 500 mg by mouth every 6 (six) hours as needed for headache (pain).   Yes [provider]  albuterol (PROVENTIL) (2.5 MG/3ML) 0.083% nebulizer solution Take 2.5 mg by nebulization every 6 (six) hours as needed for wheezing or shortness of breath.   Yes [provider]  albuterol (VENTOLIN HFA) 108 (90 Base) MCG/ACT inhaler Inhale 2 puffs into the lungs every 6 (six) hours as needed for wheezing or shortness of breath.   Yes [provider]  amiodarone (PACERONE) 200 MG tablet Take 2 tablets (400 mg total) by mouth daily. Take 400mg  by mouth daily for 1 month, then resume 200mg  by mouth daily dose. Patient taking differently: Take 200 mg by mouth daily. Take one daily 12/02/17  Yes Deboraha Sprang, MD  Carboxymethylcellul-Glycerin (LUBRICATING EYE DROPS OP) Place 1 drop into both eyes daily as needed (dry eyes).    Yes [provider]  cholecalciferol (VITAMIN D) 1000 UNITS tablet Take 1,000 Units by mouth daily.   Yes [provider]  ELIQUIS 5 MG TABS tablet TAKE 1 TABLET BY MOUTH TWICE A DAY Patient taking differently: Take 5 mg by mouth 2 (two) times daily.  07/30/15  Yes Minus Breeding, MD  finasteride (PROSCAR) 5 MG tablet Take 5 mg by mouth daily.    Yes [provider]  fluticasone (FLONASE) 50 MCG/ACT nasal spray Place 1 spray into both nostrils daily as needed for allergies.  09/08/17  Yes [provider]  Fluticasone-Salmeterol (ADVAIR DISKUS) 250-50 MCG/DOSE AEPB Inhale 1 puff  into the lungs 2 (two) times daily as needed (shortness of breath/wheezing).    Yes [provider]  levothyroxine (SYNTHROID, LEVOTHROID) 125 MCG tablet Take 125 mcg by mouth daily. 06/15/17  Yes [provider]  loratadine (CLARITIN) 10 MG tablet Take 10 mg by mouth daily as needed for allergies.    Yes [provider]  MAGNESIUM PO Take 1 tablet by mouth at bedtime.   Yes [provider]  Melatonin 3 MG TABS Take 3 mg by mouth at bedtime.   Yes [provider]  metoprolol tartrate (LOPRESSOR) 50 MG tablet HOLD. Discuss fatigue and symptoms with cardiologist 09/08/19  Yes Desiree Hane, MD  Multiple Vitamin (MULTIVITAMIN WITH  MINERALS) TABS tablet Take 1 tablet by mouth daily.   Yes [provider]  nitroGLYCERIN (NITROSTAT) 0.4 MG SL tablet Place 0.4 mg under the tongue every 5 (five) minutes as needed for chest pain.  11/12/11  Yes Burtis Junes, NP  Omega-3 Fatty Acids (FISH OIL) 500 MG CAPS Take 500 mg by mouth 2 (two) times daily.   Yes [provider]  OXYGEN Inhale 3-6 L into the lungs daily. 3 at rest, 4-6 with exertion   Yes [provider]  potassium chloride (K-DUR) 10 MEQ tablet Take 10 mEq by mouth at bedtime.    Yes [provider]  promethazine (PHENERGAN) 25 MG tablet Take 25 mg by mouth daily as needed for nausea or vomiting.  04/20/17  Yes [provider]  Psyllium (METAMUCIL PO) Take 1 Dose by mouth daily. Mix in liquid and drink    Yes [provider]  ranolazine (RANEXA) 1000 MG SR tablet Take 1 tablet (1,000 mg total) by mouth 2 (two) times daily. 09/23/18  Yes Deboraha Sprang, MD  Tamsulosin HCl (FLOMAX) 0.4 MG CAPS Take 0.4 mg by mouth at bedtime.    Yes [provider]  Tiotropium Bromide Monohydrate (SPIRIVA RESPIMAT) 2.5 MCG/ACT AERS Inhale 2 puffs into the lungs every morning.   Yes [provider]  torsemide (DEMADEX) 20 MG tablet Take 1 tablet by  mouth once daily Patient taking differently: Take 20-30 mg by mouth daily. 20 MG on Monday, Tuesday, Wednesday, Thursday, Friday 30 MG on Saturday and Sunday 06/20/19  Yes Patsey Berthold, NP      Allergies  Allergen Reactions  . Xarelto [Rivaroxaban] Other (See Comments)    Bleeding  . Adhesive [Tape] Hives, Itching and Rash  . Atorvastatin Cough  . Codeine Nausea Only  . Isosorbide Nitrate Other (See Comments)    Made him feel bad  . Latex Itching and Other (See Comments)    Rash, itching, burning  . Levofloxacin Other (See Comments)    Tachycardia  . Lisinopril Cough  . Lorazepam Other (See Comments)    "felt bad"  . Mexiletine Other (See Comments)    GI distress  . Sertraline Other (See Comments)    Made him feel bad    ROS:  Out of a complete 14 system review of symptoms, the patient complains only of the following symptoms, and all other reviewed systems are negative.  Walking difficulty Shortness of breath Tremors  Blood pressure 97/60, pulse 64, temperature (!) 97.2 F (36.2 C), height 6\' 2"  ([id number] m), weight 217 lb (98.4 kg).  Physical Exam  General: The patient is alert and cooperative at the time of the examination.  Eyes: Pupils are equal, round, and reactive to light. Discs are flat bilaterally.  Neck: The neck is supple, no carotid bruits are noted.  Respiratory: The respiratory examination is clear.  Cardiovascular: The cardiovascular examination reveals a regular rate and rhythm, no obvious murmurs or rubs are noted.  Skin: Extremities are with 1-2+ edema at the ankles bilaterally.  Neurologic Exam  Mental status: The patient is alert and oriented x 3 at the time of the examination. The patient has apparent normal recent and remote memory, with an apparently normal attention span and concentration ability.  Cranial nerves: Facial symmetry is present. There is good sensation of the face to pinprick and soft touch bilaterally. The strength of the  facial muscles and the muscles to head turning and shoulder shrug are normal bilaterally. Speech  is well enunciated, no aphasia or dysarthria is noted. Extraocular movements are full. Visual fields are full. The tongue is midline, and the patient has symmetric elevation of the soft palate. No obvious hearing deficits are noted.  Motor: The motor testing reveals 5 over 5 strength of all 4 extremities. Good symmetric motor tone is noted throughout.  Sensory: Sensory testing is intact to pinprick, soft touch, vibration sensation, and position sense on all 4 extremities, with exception that vibration sensation is decreased in both feet. No evidence of extinction is noted.  Coordination: Cerebellar testing reveals good finger-nose-finger and heel-to-shin bilaterally.  No asterixis is seen.  Gait and station: Gait is slightly wide-based, the patient normally uses a walker.  Tandem gait was not attempted.  Romberg is negative.  Reflexes: Deep tendon reflexes are symmetric, but are slightly depressed bilaterally. Toes are downgoing bilaterally.   CT head 07/29/19:  IMPRESSION: Similar findings of mild atrophy and microvascular ischemic disease without superimposed acute intracranial process.  * CT scan images were reviewed online. I agree with the written report.    Assessment/Plan:  1.  Prolonged tremor/polymyoclonus  The event described by the patient is not consistent with myoclonus associated with orthostasis or with rigors.  The patient presented with a slightly low sodium level, elevated potassium level, elevated ammonia level, and possible dehydration.  The event described above likely is related to metabolic disarray.  Careful attention to fluid status may help prevent further episodes in the future.  The events are not consistent with seizures.  No further neurologic work-up is indicated at this time, but we would be happy to see the patient back if needed.  Jill Alexanders MD 10/18/2019  2:01 PM  [facility] [address] [place], [place] [address]  Phone [phone] Fax [phone]

## 2019-11-07 NOTE — Progress Notes (Signed)
Cardiology Office Note   Date:  11/08/2019   ID:  Corey Tyler, Corey Tyler 08/05/1941, MRN [id number]  PCP:  [name], MD  Cardiologist:   Minus Breeding, MD   No chief complaint on file.     History of Present Illness: Corey Tyler is a 78 y.o. male who presents for follow up of atrial fib and chronic systolic HF.  He had tremor and was very weak.  He was having follow up with a neurologist.  I reduced his Entresto dose.  However, he called the other day with hypotension and had to hold  his Enteresto.   I did order a follow up echo and unfortunately, his EF was lower than previous at 25%.  EF in 2018 was 45 - 50%.  He was in the hospital in October and I reviewed these records for this appointment.  There were no cardiac complications noted and no evidence of heart failure.  He was in sinus rhythm.  This was a small bowel obstruction.  I do note that his ejection fraction is down to 25 to 30% on echo in September.  We tried to treat him with Delene Loll but he really did not tolerate this with low blood pressures.  When he was sent home from the hospital in October they suggested not taking the beta-blocker but he felt his heart racing with this so he restarted.  He is not having any chest pressure, neck or arm discomfort.  He is not having any new shortness of breath, PND or orthopnea.  He is chronically on 3 L of oxygen.   Past Medical History:  Diagnosis Date  . A-fib ([place])    a. Noted on 12/2012 interrogation, placed on Apixaban and ultimately stopped NOACs 2/2 personal decision 8/14.  Marland Kitchen AICD (automatic cardioverter/defibrillator) present    Cardiac defibrillator -dual  [employer]  . Arthritis   . Atrial tachycardia-non sustained    a. Noted on 03/2012 interrogation.  . Chronic systolic heart failure ([facility])    EF down to 20 to 25% per echo November 2012  . COPD (chronic obstructive pulmonary disease) ([place])   . Hyperlipidemia   . Hypertension   . ICD (implantable cardiac  defibrillator) in place   . NICM (nonischemic cardiomyopathy) ([place])    a. Normal cors 2000. b. Minimal plaque 2013.  . Old myocardial infarction    a. ?Silent MI. b. Large fixed inferolateral defect c/w with prior infarct, no ischemia. c. Cath in 2000/2013 with only minimal CAD.  Marland Kitchen Presence of permanent cardiac pacemaker   . Pulmonary nodule, right    Last scan in 2010 showing stability; felt to be benign.  Marland Kitchen PVC's (premature ventricular contractions)   . Ventricular tachycardia, polymorphic ([place])    Rx via ICD 12/14    Past Surgical History:  Procedure Laterality Date  . APPENDECTOMY    . CARDIAC CATHETERIZATION  2000  . CARDIAC DEFIBRILLATOR PLACEMENT    . CARDIOVERSION  03/30/2013   Procedure: CARDIOVERSION;  Surgeon: Thayer Headings, MD;  Location: [facility];  Service: Cardiovascular;;  . COLON SURGERY    . COLONOSCOPY N/A 09/25/2013   Procedure: COLONOSCOPY;  Surgeon: Jerene Bears, MD;  Location: [facility];  Service: Endoscopy;  Laterality: N/A;  . ICD  12/2011   [name]  . IMPLANTABLE CARDIOVERTER DEFIBRILLATOR IMPLANT N/A 01/06/2012   Procedure: IMPLANTABLE CARDIOVERTER DEFIBRILLATOR IMPLANT;  Surgeon: Deboraha Sprang, MD;  Location: [facility] CATH LAB;  Service: Cardiovascular;  Laterality: N/A;  .  PACEMAKER IMPLANT     [name]  . TEE WITHOUT CARDIOVERSION N/A 03/30/2013   Procedure: TRANSESOPHAGEAL ECHOCARDIOGRAM (TEE);  Surgeon: Thayer Headings, MD;  Location: [facility];  Service: Cardiovascular;  Laterality: N/A;  . V TACH ABLATION  06/10/2017  . V TACH ABLATION N/A 06/10/2017   Procedure: V Tach Ablation;  Surgeon: Evans Lance, MD;  Location: [facility] CV LAB;  Service: Cardiovascular;  Laterality: N/A;     Current Outpatient Medications  Medication Sig Dispense Refill  . acetaminophen (TYLENOL) 500 MG tablet Take 500 mg by mouth every 6 (six) hours as needed for headache (pain).    Marland Kitchen albuterol (PROVENTIL) (2.5 MG/3ML) 0.083% nebulizer solution Take 2.5 mg by  nebulization every 6 (six) hours as needed for wheezing or shortness of breath.    Marland Kitchen albuterol (VENTOLIN HFA) 108 (90 Base) MCG/ACT inhaler Inhale 2 puffs into the lungs every 6 (six) hours as needed for wheezing or shortness of breath.    Marland Kitchen amiodarone (PACERONE) 200 MG tablet Take 2 tablets (400 mg total) by mouth daily. Take 400mg  by mouth daily for 1 month, then resume 200mg  by mouth daily dose. (Patient taking differently: Take 200 mg by mouth daily. Take one daily) 60 tablet 0  . Carboxymethylcellul-Glycerin (LUBRICATING EYE DROPS OP) Place 1 drop into both eyes daily as needed (dry eyes).     . cholecalciferol (VITAMIN D) 1000 UNITS tablet Take 1,000 Units by mouth daily.    Marland Kitchen ELIQUIS 5 MG TABS tablet TAKE 1 TABLET BY MOUTH TWICE A DAY (Patient taking differently: Take 5 mg by mouth 2 (two) times daily. ) 60 tablet 5  . ezetimibe (ZETIA) 10 MG tablet TAKE 1 TABLET BY MOUTH ONCE DAILY FOR CHOLESTEROL FOR 30 DAYS    . finasteride (PROSCAR) 5 MG tablet Take 5 mg by mouth daily.     . fluticasone (FLONASE) 50 MCG/ACT nasal spray Place 1 spray into both nostrils daily as needed for allergies.     . Fluticasone-Salmeterol (ADVAIR DISKUS) 250-50 MCG/DOSE AEPB Inhale 1 puff into the lungs 2 (two) times daily as needed (shortness of breath/wheezing).     Marland Kitchen levothyroxine (SYNTHROID, LEVOTHROID) 125 MCG tablet Take 125 mcg by mouth daily.    Marland Kitchen loratadine (CLARITIN) 10 MG tablet Take 10 mg by mouth daily as needed for allergies.     Marland Kitchen MAGNESIUM PO Take 1 tablet by mouth at bedtime.    . Melatonin 3 MG TABS Take 3 mg by mouth at bedtime.    . metoprolol tartrate (LOPRESSOR) 50 MG tablet HOLD. Discuss fatigue and symptoms with cardiologist 60 tablet 0  . Multiple Vitamin (MULTIVITAMIN WITH MINERALS) TABS tablet Take 1 tablet by mouth daily.    . nitroGLYCERIN (NITROSTAT) 0.4 MG SL tablet Place 0.4 mg under the tongue every 5 (five) minutes as needed for chest pain.     Marland Kitchen nystatin (MYCOSTATIN) 100000 UNIT/ML  suspension APPLY 4 ML TO MOUTH OR THROAT FOUR TIMES DAILY    . Omega-3 Fatty Acids (FISH OIL) 500 MG CAPS Take 500 mg by mouth 2 (two) times daily.    . OXYGEN Inhale 3-6 L into the lungs daily. 3 at rest, 4-6 with exertion    . potassium chloride (K-DUR) 10 MEQ tablet Take 10 mEq by mouth at bedtime.     . promethazine (PHENERGAN) 25 MG tablet Take 25 mg by mouth daily as needed for nausea or vomiting.     . propranolol (INNOPRAN XL) 80 MG 24  hr capsule Take by mouth.    . Psyllium (METAMUCIL PO) Take 1 Dose by mouth daily. Mix in liquid and drink     . ranolazine (RANEXA) 1000 MG SR tablet Take 1 tablet (1,000 mg total) by mouth 2 (two) times daily. 180 tablet 3  . Tamsulosin HCl (FLOMAX) 0.4 MG CAPS Take 0.4 mg by mouth at bedtime.     . Tiotropium Bromide Monohydrate (SPIRIVA RESPIMAT) 2.5 MCG/ACT AERS Inhale 2 puffs into the lungs every morning.    . torsemide (DEMADEX) 20 MG tablet Take 1 tablet by mouth once daily (Patient taking differently: Take 20-30 mg by mouth daily. 20 MG on Monday, Tuesday, Wednesday, Thursday, Friday 30 MG on Saturday and Sunday) 90 tablet 2   No current facility-administered medications for this visit.    Allergies:   Xarelto [rivaroxaban], Adhesive [tape], Atorvastatin, Codeine, Isosorbide nitrate, Latex, Levofloxacin, Lisinopril, Lorazepam, Mexiletine, and Sertraline    ROS:  Please see the history of present illness.   Otherwise, review of systems are positive for none.   All other systems are reviewed and negative.    PHYSICAL EXAM: VS:  BP (!) 84/54   Pulse 71   Ht 6\' 2"  (1.88 m)   Wt 215 lb (97.5 kg)   BMI 27.60 kg/m  , BMI Body mass index is 27.6 kg/m. GENERAL:  Well appearing NECK:  No jugular venous distention, waveform within normal limits, carotid upstroke brisk and symmetric, no bruits, no thyromegaly LUNGS:  Clear to auscultation bilaterally CHEST:  Unremarkable HEART:  PMI not displaced or sustained,S1 and S2 within normal limits, no  S3, no S4, no clicks, no rubs, no murmurs ABD:  Flat, positive bowel sounds normal in frequency in pitch, no bruits, no rebound, no guarding, no midline pulsatile mass, no hepatomegaly, no splenomegaly EXT:  2 plus pulses throughout, mild leg edema, no cyanosis no clubbing   EKG:  EKG is not  ordered today.     Recent Labs: 07/29/2019: B Natriuretic Peptide 66.6; TSH 0.630 09/07/2019: Magnesium 1.9 09/13/2019: ALT 23; BUN 13; Creatinine, Ser 1.70; Hemoglobin 12.4; Platelets 249; Potassium 3.9; Sodium 132     Lab Results  Component Value Date   TSH 0.630 07/29/2019   ALT 23 09/13/2019   AST 19 09/13/2019   ALKPHOS 59 09/13/2019   BILITOT 0.8 09/13/2019   PROT 5.5 (L) 09/13/2019   ALBUMIN 3.1 (L) 09/13/2019     Wt Readings from Last 3 Encounters:  11/08/19 215 lb (97.5 kg)  10/18/19 217 lb (98.4 kg)  08/31/19 227 lb (103 kg)      Other studies Reviewed: Additional studies/ records that were reviewed today include: Hospital records Review of the above records demonstrates:  NA   ASSESSMENT AND PLAN:  Cardiomyopathy - The patient's ejection fraction is reduced.  However, he has not tolerated Entresto in the past.  At this point he will continue the meds as listed.  Atrial fibrillation - Mr. SAMAN CHRZANOWSKI has a CHA2DS2 - VASc score of 5.  He tolerates anticoagulation.  No change in therapy.   VT - He had normal ICD follow up on the device check in late Sept.   Hehas remote monitoring scheduled on the 29th of this month.  HYPERTENSION -  This is being managed in the context of treating his heart failure.  Low blood pressures preclude further med titration.  His blood pressure is running low today but he is relatively asymptomatic.  CAD - The patient has no new  symptoms.  No further change in therapy. ve risk reduction and meds as listed.  CKD III Creatinine is stable at 1.7.  I will repeat a basic metabolic profile today.  AAA This was 3.2 cm this year.  Follow  up in two years.   COVID ED We talked about the vaccine.    Current medicines are reviewed at length with the patient today.  The patient does not have concerns regarding medicines.  The following changes have been made:    Labs/ tests ordered today include:   Orders Placed This Encounter  Procedures  . Basic metabolic panel     Disposition:   FU with me in six months.    Signed, Minus Breeding, MD  11/08/2019 5:01 PM    [facility]

## 2019-11-08 ENCOUNTER — Other Ambulatory Visit: Payer: Self-pay

## 2019-11-08 ENCOUNTER — Encounter: Payer: Self-pay | Admitting: Cardiology

## 2019-11-08 ENCOUNTER — Ambulatory Visit (INDEPENDENT_AMBULATORY_CARE_PROVIDER_SITE_OTHER): Payer: PPO | Admitting: Cardiology

## 2019-11-08 VITALS — BP 84/54 | HR 71 | Ht 74.0 in | Wt 215.0 lb

## 2019-11-08 DIAGNOSIS — I10 Essential (primary) hypertension: Secondary | ICD-10-CM

## 2019-11-08 DIAGNOSIS — I472 Ventricular tachycardia, unspecified: Secondary | ICD-10-CM

## 2019-11-08 DIAGNOSIS — N1832 Chronic kidney disease, stage 3b: Secondary | ICD-10-CM

## 2019-11-08 DIAGNOSIS — I480 Paroxysmal atrial fibrillation: Secondary | ICD-10-CM

## 2019-11-08 DIAGNOSIS — Z7189 Other specified counseling: Secondary | ICD-10-CM

## 2019-11-08 DIAGNOSIS — I255 Ischemic cardiomyopathy: Secondary | ICD-10-CM

## 2019-11-08 DIAGNOSIS — I2510 Atherosclerotic heart disease of native coronary artery without angina pectoris: Secondary | ICD-10-CM | POA: Diagnosis not present

## 2019-11-08 NOTE — Patient Instructions (Signed)
Medication Instructions:  Your physician recommends that you continue on your current medications as directed. Please refer to the Current Medication list given to you today. *If you need a refill on your cardiac medications before your next appointment, please call your pharmacy*  Lab Work: Your physician recommends that you return for lab work today:  Basic metabolic panel  If you have labs (blood work) drawn today and your tests are completely normal, you will receive your results only by: Marland Kitchen MyChart Message (if you have MyChart) OR . A paper copy in the mail If you have any lab test that is abnormal or we need to change your treatment, we will call you to review the results.  Testing/Procedures: none  Follow-Up: At [facility], you and your health needs are our priority.  As part of our continuing mission to provide you with exceptional heart care, we have created designated Provider Care Teams.  These Care Teams include your primary Cardiologist (physician) and Advanced Practice Providers (APPs -  Physician Assistants and Nurse Practitioners) who all work together to provide you with the care you need, when you need it.  Your next appointment:   6 month(s)  The format for your next appointment:   In Person  Provider:   Minus Breeding, MD

## 2019-11-09 LAB — BASIC METABOLIC PANEL
BUN/Creatinine Ratio: 12 (ref 10–24)
BUN: 20 mg/dL (ref 8–27)
CO2: 24 mmol/L (ref 20–29)
Calcium: 8.6 mg/dL (ref 8.6–10.2)
Chloride: 100 mmol/L (ref 96–106)
Creatinine, Ser: 1.72 mg/dL — ABNORMAL HIGH (ref 0.76–1.27)
GFR calc Af Amer: 43 mL/min/{1.73_m2} — ABNORMAL LOW (ref >59)
GFR calc non Af Amer: 37 mL/min/{1.73_m2} — ABNORMAL LOW (ref >59)
Glucose: 120 mg/dL — ABNORMAL HIGH (ref 65–99)
Potassium: 3.7 mmol/L (ref 3.5–5.2)
Sodium: 142 mmol/L (ref 134–144)

## 2019-11-13 DIAGNOSIS — E559 Vitamin D deficiency, unspecified: Secondary | ICD-10-CM | POA: Diagnosis not present

## 2019-11-13 DIAGNOSIS — E039 Hypothyroidism, unspecified: Secondary | ICD-10-CM | POA: Diagnosis not present

## 2019-11-13 DIAGNOSIS — I10 Essential (primary) hypertension: Secondary | ICD-10-CM | POA: Diagnosis not present

## 2019-11-13 DIAGNOSIS — I5022 Chronic systolic (congestive) heart failure: Secondary | ICD-10-CM | POA: Diagnosis not present

## 2019-11-15 DIAGNOSIS — J449 Chronic obstructive pulmonary disease, unspecified: Secondary | ICD-10-CM | POA: Diagnosis not present

## 2019-11-15 DIAGNOSIS — I2510 Atherosclerotic heart disease of native coronary artery without angina pectoris: Secondary | ICD-10-CM | POA: Diagnosis not present

## 2019-11-15 DIAGNOSIS — E039 Hypothyroidism, unspecified: Secondary | ICD-10-CM | POA: Diagnosis not present

## 2019-11-15 DIAGNOSIS — D649 Anemia, unspecified: Secondary | ICD-10-CM | POA: Diagnosis not present

## 2019-11-15 DIAGNOSIS — E785 Hyperlipidemia, unspecified: Secondary | ICD-10-CM | POA: Diagnosis not present

## 2019-11-20 ENCOUNTER — Telehealth: Payer: Self-pay | Admitting: Emergency Medicine

## 2019-11-20 NOTE — Telephone Encounter (Signed)
LMOM. Assess for symptoms after VT episode on 11/17/19 at 2253 that was successfully treated by ATP x 1. Treated at 155 bpm in VT 1 zone.

## 2019-11-20 NOTE — Telephone Encounter (Signed)
Patient states he felt he was in VT on 11/17/19 at time of alert. Just felt weak for a few minutes after episode. No missed doses of amiodorone. Shock plan and ED precautions reviewed and patient willl call office with any change in condition.

## 2019-11-21 ENCOUNTER — Ambulatory Visit (INDEPENDENT_AMBULATORY_CARE_PROVIDER_SITE_OTHER): Payer: PPO | Admitting: *Deleted

## 2019-11-21 DIAGNOSIS — I428 Other cardiomyopathies: Secondary | ICD-10-CM | POA: Diagnosis not present

## 2019-11-21 LAB — CUP PACEART REMOTE DEVICE CHECK
Battery Remaining Longevity: 13 mo
Battery Remaining Percentage: 14 %
Battery Voltage: 2.72 V
Brady Statistic AP VP Percent: 1 %
Brady Statistic AP VS Percent: 65 %
Brady Statistic AS VP Percent: 1 %
Brady Statistic AS VS Percent: 33 %
Brady Statistic RA Percent Paced: 65 %
Brady Statistic RV Percent Paced: 1.1 %
Date Time Interrogation Session: 20201229030914
HighPow Impedance: 74 Ohm
HighPow Impedance: 74 Ohm
Implantable Lead Implant Date: 20130213
Implantable Lead Implant Date: 20130213
Implantable Lead Location: 753859
Implantable Lead Location: 753860
Implantable Pulse Generator Implant Date: 20130213
Lead Channel Impedance Value: 390 Ohm
Lead Channel Impedance Value: 410 Ohm
Lead Channel Pacing Threshold Amplitude: 0.5 V
Lead Channel Pacing Threshold Amplitude: 0.75 V
Lead Channel Pacing Threshold Pulse Width: 0.5 ms
Lead Channel Pacing Threshold Pulse Width: 0.6 ms
Lead Channel Sensing Intrinsic Amplitude: 12 mV
Lead Channel Sensing Intrinsic Amplitude: 2 mV
Lead Channel Setting Pacing Amplitude: 1.5 V
Lead Channel Setting Pacing Amplitude: 2.5 V
Lead Channel Setting Pacing Pulse Width: 0.6 ms
Lead Channel Setting Sensing Sensitivity: 0.5 mV
Pulse Gen Serial Number: 7003419

## 2019-11-22 NOTE — Telephone Encounter (Signed)
Noted  

## 2019-11-22 NOTE — Progress Notes (Signed)
ICD remote 

## 2019-12-18 DIAGNOSIS — H35033 Hypertensive retinopathy, bilateral: Secondary | ICD-10-CM | POA: Diagnosis not present

## 2019-12-18 DIAGNOSIS — H40013 Open angle with borderline findings, low risk, bilateral: Secondary | ICD-10-CM | POA: Diagnosis not present

## 2019-12-18 DIAGNOSIS — Z961 Presence of intraocular lens: Secondary | ICD-10-CM | POA: Diagnosis not present

## 2019-12-18 DIAGNOSIS — H524 Presbyopia: Secondary | ICD-10-CM | POA: Diagnosis not present

## 2019-12-18 DIAGNOSIS — H04123 Dry eye syndrome of bilateral lacrimal glands: Secondary | ICD-10-CM | POA: Diagnosis not present

## 2019-12-27 DIAGNOSIS — E039 Hypothyroidism, unspecified: Secondary | ICD-10-CM | POA: Diagnosis not present

## 2019-12-27 DIAGNOSIS — I4891 Unspecified atrial fibrillation: Secondary | ICD-10-CM | POA: Diagnosis not present

## 2019-12-27 DIAGNOSIS — I472 Ventricular tachycardia: Secondary | ICD-10-CM | POA: Diagnosis not present

## 2019-12-27 DIAGNOSIS — J301 Allergic rhinitis due to pollen: Secondary | ICD-10-CM | POA: Diagnosis not present

## 2019-12-27 DIAGNOSIS — E559 Vitamin D deficiency, unspecified: Secondary | ICD-10-CM | POA: Diagnosis not present

## 2019-12-28 ENCOUNTER — Telehealth: Payer: Self-pay | Admitting: Internal Medicine

## 2019-12-28 NOTE — Telephone Encounter (Signed)
Pt with 2 episodes of VT, both ATP terminated. Attempted to reach patient to discuss.  No answer.       Chanetta Marshall, NP 12/28/2019 12:26 PM

## 2019-12-28 NOTE — Telephone Encounter (Signed)
A  what was the date of the second event.  He has had VT in the past but not so much recently and not  clear what has changed.  NICM  maybe worth check lytes  And will follow Thanks SK

## 2019-12-29 ENCOUNTER — Emergency Department (HOSPITAL_COMMUNITY): Payer: No Typology Code available for payment source

## 2019-12-29 ENCOUNTER — Observation Stay (HOSPITAL_COMMUNITY)
Admission: EM | Admit: 2019-12-29 | Discharge: 2019-12-30 | Disposition: A | Discharge status: Home / Self Care | Payer: No Typology Code available for payment source | Attending: Family Medicine | Admitting: Family Medicine

## 2019-12-29 ENCOUNTER — Encounter (HOSPITAL_COMMUNITY): Payer: Self-pay | Admitting: Emergency Medicine

## 2019-12-29 ENCOUNTER — Other Ambulatory Visit: Payer: Self-pay

## 2019-12-29 DIAGNOSIS — R531 Weakness: Secondary | ICD-10-CM | POA: Diagnosis not present

## 2019-12-29 DIAGNOSIS — N183 Chronic kidney disease, stage 3 unspecified: Secondary | ICD-10-CM | POA: Diagnosis not present

## 2019-12-29 DIAGNOSIS — I5022 Chronic systolic (congestive) heart failure: Secondary | ICD-10-CM | POA: Diagnosis not present

## 2019-12-29 DIAGNOSIS — I4729 Other ventricular tachycardia: Secondary | ICD-10-CM

## 2019-12-29 DIAGNOSIS — Z95810 Presence of automatic (implantable) cardiac defibrillator: Secondary | ICD-10-CM | POA: Insufficient documentation

## 2019-12-29 DIAGNOSIS — E785 Hyperlipidemia, unspecified: Secondary | ICD-10-CM | POA: Insufficient documentation

## 2019-12-29 DIAGNOSIS — I472 Ventricular tachycardia: Secondary | ICD-10-CM | POA: Diagnosis not present

## 2019-12-29 DIAGNOSIS — Z7901 Long term (current) use of anticoagulants: Secondary | ICD-10-CM | POA: Diagnosis not present

## 2019-12-29 DIAGNOSIS — I951 Orthostatic hypotension: Secondary | ICD-10-CM | POA: Diagnosis not present

## 2019-12-29 DIAGNOSIS — Z79899 Other long term (current) drug therapy: Secondary | ICD-10-CM | POA: Insufficient documentation

## 2019-12-29 DIAGNOSIS — I4891 Unspecified atrial fibrillation: Secondary | ICD-10-CM | POA: Diagnosis not present

## 2019-12-29 DIAGNOSIS — I130 Hypertensive heart and chronic kidney disease with heart failure and stage 1 through stage 4 chronic kidney disease, or unspecified chronic kidney disease: Secondary | ICD-10-CM | POA: Insufficient documentation

## 2019-12-29 DIAGNOSIS — N1832 Chronic kidney disease, stage 3b: Secondary | ICD-10-CM

## 2019-12-29 DIAGNOSIS — J9611 Chronic respiratory failure with hypoxia: Secondary | ICD-10-CM | POA: Diagnosis not present

## 2019-12-29 DIAGNOSIS — E039 Hypothyroidism, unspecified: Secondary | ICD-10-CM | POA: Insufficient documentation

## 2019-12-29 DIAGNOSIS — Z20822 Contact with and (suspected) exposure to covid-19: Secondary | ICD-10-CM | POA: Insufficient documentation

## 2019-12-29 DIAGNOSIS — N1831 Chronic kidney disease, stage 3a: Secondary | ICD-10-CM | POA: Diagnosis present

## 2019-12-29 DIAGNOSIS — R05 Cough: Secondary | ICD-10-CM | POA: Diagnosis not present

## 2019-12-29 LAB — BASIC METABOLIC PANEL
Anion gap: 9 (ref 5–15)
BUN: 17 mg/dL (ref 8–23)
CO2: 26 mmol/L (ref 22–32)
Calcium: 8.5 mg/dL — ABNORMAL LOW (ref 8.9–10.3)
Chloride: 101 mmol/L (ref 98–111)
Creatinine, Ser: 1.57 mg/dL — ABNORMAL HIGH (ref 0.61–1.24)
GFR calc Af Amer: 48 mL/min — ABNORMAL LOW (ref >60)
GFR calc non Af Amer: 41 mL/min — ABNORMAL LOW (ref >60)
Glucose, Bld: 112 mg/dL — ABNORMAL HIGH (ref 70–99)
Potassium: 3.9 mmol/L (ref 3.5–5.1)
Sodium: 136 mmol/L (ref 135–145)

## 2019-12-29 LAB — URINALYSIS, ROUTINE W REFLEX MICROSCOPIC
Bilirubin Urine: NEGATIVE
Glucose, UA: NEGATIVE mg/dL
Hgb urine dipstick: NEGATIVE
Ketones, ur: NEGATIVE mg/dL
Leukocytes,Ua: NEGATIVE
Nitrite: NEGATIVE
Protein, ur: NEGATIVE mg/dL
Specific Gravity, Urine: 1.008 (ref 1.005–1.030)
pH: 6 (ref 5.0–8.0)

## 2019-12-29 LAB — CBC
HCT: 39.2 % (ref 39.0–52.0)
Hemoglobin: 12.8 g/dL — ABNORMAL LOW (ref 13.0–17.0)
MCH: 32.4 pg (ref 26.0–34.0)
MCHC: 32.7 g/dL (ref 30.0–36.0)
MCV: 99.2 fL (ref 80.0–100.0)
Platelets: 202 10*3/uL (ref 150–400)
RBC: 3.95 MIL/uL — ABNORMAL LOW (ref 4.22–5.81)
RDW: 14.1 % (ref 11.5–15.5)
WBC: 7.4 10*3/uL (ref 4.0–10.5)
nRBC: 0 % (ref 0.0–0.2)

## 2019-12-29 LAB — RESPIRATORY PANEL BY RT PCR (FLU A&B, COVID)
Influenza A by PCR: NEGATIVE
Influenza B by PCR: NEGATIVE
SARS Coronavirus 2 by RT PCR: NEGATIVE

## 2019-12-29 LAB — MAGNESIUM: Magnesium: 2 mg/dL (ref 1.7–2.4)

## 2019-12-29 LAB — TSH: TSH: 1.485 u[IU]/mL (ref 0.350–4.500)

## 2019-12-29 LAB — TROPONIN I (HIGH SENSITIVITY)
Troponin I (High Sensitivity): 23 ng/L — ABNORMAL HIGH (ref <18)
Troponin I (High Sensitivity): 27 ng/L — ABNORMAL HIGH (ref <18)

## 2019-12-29 MED ORDER — MOMETASONE FURO-FORMOTEROL FUM 200-5 MCG/ACT IN AERO
2.0000 | INHALATION_SPRAY | Freq: Two times a day (BID) | RESPIRATORY_TRACT | Status: DC
  Filled 2019-12-29: qty 8.8

## 2019-12-29 MED ORDER — CARBOXYMETHYLCELLUL-GLYCERIN 0.5-0.9 % OP SOLN: 1.0000 [drp] | Freq: Three times a day (TID) | OPHTHALMIC | Status: DC | PRN

## 2019-12-29 MED ORDER — SODIUM CHLORIDE 0.9% FLUSH
3.0000 mL | Freq: Once | INTRAVENOUS | Status: AC
  Administered 2019-12-29: 13:00:00 3 mL via INTRAVENOUS

## 2019-12-29 MED ORDER — ONDANSETRON HCL 4 MG PO TABS: 4.0000 mg | ORAL_TABLET | Freq: Four times a day (QID) | ORAL | Status: DC | PRN

## 2019-12-29 MED ORDER — NITROGLYCERIN 0.4 MG SL SUBL: 0.4000 mg | SUBLINGUAL_TABLET | SUBLINGUAL | Status: DC | PRN

## 2019-12-29 MED ORDER — PROMETHAZINE HCL 25 MG PO TABS: 25.0000 mg | ORAL_TABLET | Freq: Every day | ORAL | Status: DC | PRN

## 2019-12-29 MED ORDER — AMIODARONE HCL 200 MG PO TABS
200.0000 mg | ORAL_TABLET | Freq: Every day | ORAL | Status: DC
  Administered 2019-12-30: 09:00:00 200 mg via ORAL
  Filled 2019-12-29: qty 1

## 2019-12-29 MED ORDER — ALBUTEROL SULFATE HFA 108 (90 BASE) MCG/ACT IN AERS
4.0000 | INHALATION_SPRAY | Freq: Once | RESPIRATORY_TRACT | Status: DC
  Filled 2019-12-29: qty 6.7

## 2019-12-29 MED ORDER — OMEGA-3-ACID ETHYL ESTERS 1 G PO CAPS
1.0000 g | ORAL_CAPSULE | Freq: Two times a day (BID) | ORAL | Status: DC
  Administered 2019-12-29 – 2019-12-30 (×2): 1 g via ORAL
  Filled 2019-12-29 (×2): qty 1

## 2019-12-29 MED ORDER — LEVOTHYROXINE SODIUM 25 MCG PO TABS
125.0000 ug | ORAL_TABLET | Freq: Every day | ORAL | Status: DC
  Administered 2019-12-30: 06:00:00 125 ug via ORAL
  Filled 2019-12-29: qty 1

## 2019-12-29 MED ORDER — METOPROLOL TARTRATE 25 MG PO TABS
25.0000 mg | ORAL_TABLET | Freq: Two times a day (BID) | ORAL | Status: DC
  Administered 2019-12-30: 09:00:00 25 mg via ORAL
  Filled 2019-12-29 (×2): qty 1

## 2019-12-29 MED ORDER — SODIUM CHLORIDE 0.9 % IV BOLUS
500.0000 mL | Freq: Once | INTRAVENOUS | Status: AC
  Administered 2019-12-29: 14:00:00 500 mL via INTRAVENOUS

## 2019-12-29 MED ORDER — POLYVINYL ALCOHOL 1.4 % OP SOLN
1.0000 [drp] | Freq: Three times a day (TID) | OPHTHALMIC | Status: DC | PRN
  Filled 2019-12-29: qty 15

## 2019-12-29 MED ORDER — UMECLIDINIUM BROMIDE 62.5 MCG/INH IN AEPB
1.0000 | INHALATION_SPRAY | Freq: Every day | RESPIRATORY_TRACT | Status: DC
  Filled 2019-12-29: qty 7

## 2019-12-29 MED ORDER — RANOLAZINE ER 500 MG PO TB12
1000.0000 mg | ORAL_TABLET | Freq: Two times a day (BID) | ORAL | Status: DC
  Administered 2019-12-29 – 2019-12-30 (×2): 1000 mg via ORAL
  Filled 2019-12-29 (×3): qty 2

## 2019-12-29 MED ORDER — PSYLLIUM 95 % PO PACK
1.0000 | PACK | Freq: Every day | ORAL | Status: DC
  Administered 2019-12-30: 09:00:00 1 via ORAL
  Filled 2019-12-29: qty 1

## 2019-12-29 MED ORDER — MAGNESIUM OXIDE 400 (241.3 MG) MG PO TABS
ORAL_TABLET | Freq: Every day | ORAL | Status: DC
  Administered 2019-12-29: 400 mg via ORAL
  Filled 2019-12-29: qty 1

## 2019-12-29 MED ORDER — MELATONIN 3 MG PO TABS
3.0000 mg | ORAL_TABLET | Freq: Every day | ORAL | Status: DC
  Administered 2019-12-30: 3 mg via ORAL
  Filled 2019-12-29 (×3): qty 1

## 2019-12-29 MED ORDER — FLUTICASONE PROPIONATE 50 MCG/ACT NA SUSP
1.0000 | Freq: Every day | NASAL | Status: DC | PRN
  Filled 2019-12-29: qty 16

## 2019-12-29 MED ORDER — SODIUM CHLORIDE 0.9 % IV SOLN: INTRAVENOUS | Status: AC

## 2019-12-29 MED ORDER — TIOTROPIUM BROMIDE MONOHYDRATE 2.5 MCG/ACT IN AERS: 2.0000 | INHALATION_SPRAY | Freq: Every morning | RESPIRATORY_TRACT | Status: DC

## 2019-12-29 MED ORDER — ONDANSETRON HCL 4 MG/2ML IJ SOLN: 4.0000 mg | Freq: Four times a day (QID) | INTRAMUSCULAR | Status: DC | PRN

## 2019-12-29 MED ORDER — APIXABAN 5 MG PO TABS
5.0000 mg | ORAL_TABLET | Freq: Two times a day (BID) | ORAL | Status: DC
  Administered 2019-12-29 – 2019-12-30 (×2): 5 mg via ORAL
  Filled 2019-12-29 (×3): qty 1

## 2019-12-29 MED ORDER — VITAMIN D 25 MCG (1000 UNIT) PO TABS
1000.0000 [IU] | ORAL_TABLET | Freq: Every day | ORAL | Status: DC
  Administered 2019-12-30: 09:00:00 1000 [IU] via ORAL
  Filled 2019-12-29: qty 1

## 2019-12-29 MED ORDER — FINASTERIDE 5 MG PO TABS
5.0000 mg | ORAL_TABLET | Freq: Every day | ORAL | Status: DC
  Administered 2019-12-30: 09:00:00 5 mg via ORAL
  Filled 2019-12-29: qty 1

## 2019-12-29 MED ORDER — LORATADINE 10 MG PO TABS: 10.0000 mg | ORAL_TABLET | Freq: Every day | ORAL | Status: DC | PRN

## 2019-12-29 MED ORDER — ACETAMINOPHEN 500 MG PO TABS: 500.0000 mg | ORAL_TABLET | Freq: Four times a day (QID) | ORAL | Status: DC | PRN

## 2019-12-29 MED ORDER — EZETIMIBE 10 MG PO TABS
10.0000 mg | ORAL_TABLET | Freq: Every day | ORAL | Status: DC
  Administered 2019-12-30: 09:00:00 10 mg via ORAL
  Filled 2019-12-29: qty 1

## 2019-12-29 NOTE — Consult Note (Addendum)
Cardiology Consultation:   Patient ID: BLANCHE LUCZAK MRN: [id number]; DOB: 05/11/1941  Admit date: 12/29/2019 Date of Consult: 12/29/2019  Primary Care Provider: [name], MD Primary Cardiologist: Minus Breeding, MD  Primary Electrophysiologist:  None    Patient Profile:   RIGHTEOUS CHIASSON is a 79 y.o. male with a hx of chronic systolic heart failure, history of polymorphic VT s/p dual-chamber St [name] AICD, chronic atrial fibrillation on Eliquis, COPD on 3 L home O2, hypertension, and hyperlipidemia who is being seen today for the evaluation of weakness at the request of Dr. Earnest Conroy.  History of Present Illness:   Mr. Limas is a pleasant 79 year old male with past medical history of chronic systolic heart failure, history of polymorphic VT s/p dual-chamber St [name] AICD, chronic atrial fibrillation on Eliquis, COPD on 3 L home O2, hypertension, and hyperlipidemia.  Last cardiac catheterization performed on 10/30/1999 by Dr. Glade Lloyd showed normal coronary arteries, small isolated akinetic side in the mid inferior left ventricular wall, normal EF of 50%.  He had another cardiac catheterization in 2013 and this revealed minimal plaque.  Previous echocardiogram in 2018 showed EF of 45 to 50%.  Recent repeat echocardiogram obtained on 08/18/2019 showed EF dropped down to 25 to 30%.  Dr. Percival Spanish tried Delene Loll, however patient is unable to tolerate due to hypotension.  When he was sent home from the hospital in October, it was suggested to him not to take beta-blocker however due to racing heart rate, this was restarted.  He was most recently seen by Dr. Percival Spanish on 11/08/2019 at which time she was doing well.  More recently, he contacted cardiology service due to occasional weakness.  Remote transmission revealed due to episode of VT, both ATP terminated.  When he woke up in the morning of 12/29/2019, he continued to have weakness and dizziness.  This prompted the patient to seek medical attention Zacarias Pontes, ED.  On arrival, he was noted to have significant orthostatic hypotension. He denies any CP, SOB, LE edema, orthopnea or PND. He was admitted by hospitalist service.  Cardiology was consulted for weakness which may be related to recurrent VT episode.  Patient is currently receiving IV fluid and he is feeling much better.  Telemetry does show morphology changes with stable heart rate, however there is no evidence of VT while in the ED.  Heart Pathway Score:     Past Medical History:  Diagnosis Date  . A-fib ([place])    a. Noted on 12/2012 interrogation, placed on Apixaban and ultimately stopped NOACs 2/2 personal decision 8/14.  Marland Kitchen AICD (automatic cardioverter/defibrillator) present    Cardiac defibrillator -dual  St Judes  . Arthritis   . Atrial tachycardia-non sustained    a. Noted on 03/2012 interrogation.  . Chronic systolic heart failure (H[facility])    EF down to 20 to 25% per echo November 2012  . COPD (chronic obstructive pulmonary disease) ([place])   . Hyperlipidemia   . Hypertension   . ICD (implantable cardiac defibrillator) in place   . NICM (nonischemic cardiomyopathy) ([place])    a. Normal cors 2000. b. Minimal plaque 2013.  . Old myocardial infarction    a. ?Silent MI. b. Large fixed inferolateral defect c/w with prior infarct, no ischemia. c. Cath in 2000/2013 with only minimal CAD.  Marland Kitchen Presence of permanent cardiac pacemaker   . Pulmonary nodule, right    Last scan in 2010 showing stability; felt to be benign.  Marland Kitchen PVC's (premature ventricular contractions)   .  Ventricular tachycardia, polymorphic ([place])    Rx via ICD 12/14    Past Surgical History:  Procedure Laterality Date  . APPENDECTOMY    . CARDIAC CATHETERIZATION  2000  . CARDIAC DEFIBRILLATOR PLACEMENT    . CARDIOVERSION  03/30/2013   Procedure: CARDIOVERSION;  Surgeon: Thayer Headings, MD;  Location: [facility];  Service: Cardiovascular;;  . COLON SURGERY    . COLONOSCOPY N/A 09/25/2013   Procedure: COLONOSCOPY;   Surgeon: Jerene Bears, MD;  Location: [facility];  Service: Endoscopy;  Laterality: N/A;  . ICD  12/2011   [name]  . IMPLANTABLE CARDIOVERTER DEFIBRILLATOR IMPLANT N/A 01/06/2012   Procedure: IMPLANTABLE CARDIOVERTER DEFIBRILLATOR IMPLANT;  Surgeon: Deboraha Sprang, MD;  Location: [facility] CATH LAB;  Service: Cardiovascular;  Laterality: N/A;  . PACEMAKER IMPLANT     St [name]  . TEE WITHOUT CARDIOVERSION N/A 03/30/2013   Procedure: TRANSESOPHAGEAL ECHOCARDIOGRAM (TEE);  Surgeon: Thayer Headings, MD;  Location: [facility];  Service: Cardiovascular;  Laterality: N/A;  . V TACH ABLATION  06/10/2017  . V TACH ABLATION N/A 06/10/2017   Procedure: V Tach Ablation;  Surgeon: Evans Lance, MD;  Location: [facility] CV LAB;  Service: Cardiovascular;  Laterality: N/A;     Home Medications:  Prior to Admission medications   Medication Sig Start Date End Date Taking? Authorizing Provider  acetaminophen (TYLENOL) 500 MG tablet Take 500 mg by mouth every 6 (six) hours as needed for headache (pain).   Yes [provider]  albuterol (PROVENTIL) (2.5 MG/3ML) 0.083% nebulizer solution Take 2.5 mg by nebulization every 6 (six) hours as needed for wheezing or shortness of breath.   Yes [provider]  albuterol (VENTOLIN HFA) 108 (90 Base) MCG/ACT inhaler Inhale 2 puffs into the lungs every 6 (six) hours as needed for wheezing or shortness of breath.   Yes [provider]  amiodarone (PACERONE) 200 MG tablet Take 2 tablets (400 mg total) by mouth daily. Take 400mg  by mouth daily for 1 month, then resume 200mg  by mouth daily dose. Patient taking differently: Take 200 mg by mouth daily.  12/02/17  Yes Deboraha Sprang, MD  Camphor (VICKS VAPO STEAM IN) Inhale 1 application into the lungs at bedtime as needed (to open the lungs and for nasal congestion).    Yes [provider]  Carboxymethylcellul-Glycerin (LUBRICATING EYE DROPS OP) Place 1 drop into both eyes daily as needed (dry eyes).     Yes [provider]  ELIQUIS 5 MG TABS tablet TAKE 1 TABLET BY MOUTH TWICE A DAY Patient taking differently: Take 5 mg by mouth 2 (two) times daily.  07/30/15  Yes Minus Breeding, MD  finasteride (PROSCAR) 5 MG tablet Take 5 mg by mouth daily.    Yes [provider]  fluticasone (FLONASE) 50 MCG/ACT nasal spray Place 1 spray into both nostrils daily as needed for allergies.  09/08/17  Yes [provider]  Fluticasone-Salmeterol (ADVAIR DISKUS) 250-50 MCG/DOSE AEPB Inhale 1 puff into the lungs 2 (two) times daily as needed (shortness of breath/wheezing).    Yes [provider]  levothyroxine (SYNTHROID, LEVOTHROID) 125 MCG tablet Take 125 mcg by mouth daily before breakfast.  06/15/17  Yes [provider]  MAGNESIUM PO Take 1 tablet by mouth at bedtime.   Yes [provider]  Melatonin 3 MG TABS Take 3 mg by mouth at bedtime.   Yes [provider]  metoprolol tartrate (LOPRESSOR) 50 MG tablet HOLD. Discuss fatigue and symptoms with  cardiologist Patient taking differently: Take 25 mg by mouth 2 (two) times daily.  09/08/19  Yes Oretha Milch D, MD  nitroGLYCERIN (NITROSTAT) 0.4 MG SL tablet Place 0.4 mg under the tongue every 5 (five) minutes as needed for chest pain.  11/12/11  Yes Burtis Junes, NP  Omega-3 Fatty Acids (FISH OIL) 500 MG CAPS Take 500 mg by mouth 2 (two) times daily.   Yes [provider]  OXYGEN Inhale 3-6 L into the lungs See admin instructions. Inhale 3 L/min continuously at rest and 4.5-6 L/min with exertion   Yes [provider]  Pediatric Multivit-Minerals-C (FLINTSTONES GUMMIES COMPLETE) CHEW Chew 1 tablet by mouth daily with breakfast.   Yes [provider]  POTASSIUM PO Take 1 tablet by mouth at bedtime.   Yes [provider]  promethazine (PHENERGAN) 25 MG tablet Take 25 mg by mouth daily as needed for nausea or vomiting.  04/20/17  Yes [provider]  Psyllium  (METAMUCIL PO) Take 1 Dose by mouth daily. Mix in liquid and drink    Yes [provider]  ranolazine (RANEXA) 1000 MG SR tablet Take 1 tablet (1,000 mg total) by mouth 2 (two) times daily. 09/23/18  Yes Deboraha Sprang, MD  Tamsulosin HCl (FLOMAX) 0.4 MG CAPS Take 0.4 mg by mouth at bedtime.    Yes [provider]  Tiotropium Bromide Monohydrate (SPIRIVA RESPIMAT) 2.5 MCG/ACT AERS Inhale 2 puffs into the lungs every morning.   Yes [provider]  torsemide (DEMADEX) 20 MG tablet Take 1 tablet by mouth once daily Patient taking differently: Take 20-30 mg by mouth daily. Take 30 mg by mouth on Sun/Sat and 20 mg on Mon/Tues/Wed/Thurs/Fri 06/20/19  Yes Seiler, [employer] K, NP  cholecalciferol (VITAMIN D) 1000 UNITS tablet Take 1,000 Units by mouth daily.    [provider]  ezetimibe (ZETIA) 10 MG tablet Take 10 mg by mouth daily.  06/09/19   [provider]  loratadine (CLARITIN) 10 MG tablet Take 10 mg by mouth daily as needed for allergies.     [provider]  nystatin (MYCOSTATIN) 100000 UNIT/ML suspension Use as directed 4 mLs in the mouth or throat 4 (four) times daily.  08/14/19   [provider]  propranolol (INNOPRAN XL) 80 MG 24 hr capsule Take by mouth. 03/26/17   [provider]    Inpatient Medications: Scheduled Meds: . albuterol  4 puff Inhalation Once  . [START ON 12/30/2019] amiodarone  200 mg Oral Daily  . apixaban  5 mg Oral BID  . cholecalciferol  1,000 Units Oral Daily  . ezetimibe  10 mg Oral Daily  . finasteride  5 mg Oral Daily  . [START ON 12/30/2019] levothyroxine  125 mcg Oral QAC breakfast  . magnesium oxide   Oral QHS  . Melatonin  3 mg Oral QHS  . metoprolol tartrate  25 mg Oral BID  . mometasone-formoterol  2 puff Inhalation BID  . omega-3 acid ethyl esters  1 g Oral BID  . psyllium  1 packet Oral Daily  . ranolazine  1,000 mg Oral BID  . [START ON 12/30/2019] Tiotropium Bromide Monohydrate  2 puff  Inhalation q morning - 10a   Continuous Infusions: . sodium chloride     PRN Meds: acetaminophen, carboxymethylcellul-glycerin, fluticasone, loratadine, nitroGLYCERIN, ondansetron **OR** ondansetron (ZOFRAN) IV, promethazine  Allergies:    Allergies  Allergen Reactions  . Atorvastatin Shortness Of Breath and Cough  . Statins Shortness Of Breath and Other (  See Comments)    Muscle and joing pain and "These bother my lungs and make my gait unsteady, also"  . Xarelto [Rivaroxaban] Other (See Comments)    Bleeding  . Adhesive [Tape] Hives, Itching and Rash  . Codeine Nausea Only  . Isosorbide Nitrate Other (See Comments)    Made him feel bad  . Latex Itching and Other (See Comments)    Rash, itching, burning  . Levofloxacin Other (See Comments)    Tachycardia  . Lisinopril Cough  . Lorazepam Other (See Comments)    "felt bad"  . Mexiletine Other (See Comments)    GI distress  . Sertraline Other (See Comments)    Made him feel bad    Social History:   Social History   Socioeconomic History  . Marital status: Married    Spouse name: Not on file  . Number of children: 2  . Years of education: Not on file  . Highest education level: Not on file  Occupational History  . Occupation: Retired    Comment: Press photographer  . Occupation: Works    Fish farm manager: [employer]  Tobacco Use  . Smoking status: Former Smoker    Packs/day: 2.50    Years: 40.00    Pack years: 100.00    Types: Cigarettes    Quit date: 11/24/1983    Years since quitting: 36.1  . Smokeless tobacco: Never Used  . Tobacco comment: 2 1/2 ppd x 40 years  Substance and Sexual Activity  . Alcohol use: No  . Drug use: No  . Sexual activity: Yes    Birth control/protection: None  Other Topics Concern  . Not on file  Social History Narrative   Still works at [employer] part-time   Lives with Wife in [place] Strain:   . Difficulty of Paying Living  Expenses: Not on file  Food Insecurity:   . Worried About Charity fundraiser in the Last Year: Not on file  . Ran Out of Food in the Last Year: Not on file  Transportation Needs:   . Lack of Transportation (Medical): Not on file  . Lack of Transportation (Non-Medical): Not on file  Physical Activity:   . Days of Exercise per Week: Not on file  . Minutes of Exercise per Session: Not on file  Stress:   . Feeling of Stress : Not on file  Social Connections:   . Frequency of Communication with Friends and Family: Not on file  . Frequency of Social Gatherings with Friends and Family: Not on file  . Attends Religious Services: Not on file  . Active Member of Clubs or Organizations: Not on file  . Attends Archivist Meetings: Not on file  . Marital Status: Not on file  Intimate Partner Violence:   . Fear of Current or Ex-Partner: Not on file  . Emotionally Abused: Not on file  . Physically Abused: Not on file  . Sexually Abused: Not on file    Family History:    Family History  Problem Relation Age of Onset  . Emphysema Mother   . Heart disease Mother        AVR  . Heart failure Father        Died agwe 25  . Esophageal cancer Brother   . Colon cancer Neg Hx      ROS:  Please see the history of present illness.   All other ROS reviewed and  negative.     Physical Exam/Data:   Vitals:   12/29/19 1400 12/29/19 1430 12/29/19 1500 12/29/19 1530  BP: 102/70 127/71 119/67 116/63  Pulse: 63 63 63 61  Resp: (!) 22 19 19 19   Temp:      TempSrc:      SpO2: 100% 100% 100% 99%    Intake/Output Summary (Last 24 hours) at 12/29/2019 1918 Last data filed at 12/29/2019 1457 Gross per 24 hour  Intake 500 ml  Output --  Net 500 ml   Last 3 Weights 11/08/2019 10/18/2019 08/31/2019  Weight (lbs) 215 lb 217 lb 227 lb  Weight (kg) 97.523 kg 98.431 kg 102.967 kg     There is no height or weight on file to calculate BMI.  General:  Well nourished, well developed, in no acute  distress HEENT: normal Lymph: no adenopathy Neck: no JVD Endocrine:  No thryomegaly Vascular: No carotid bruits; FA pulses 2+ bilaterally without bruits  Cardiac:  normal S1, S2; RRR; no murmur  Lungs: Diminished breath sounds bilaterally, however no wheezing, rhonchi or crackle Abd: soft, nontender, no hepatomegaly  Ext: no edema Musculoskeletal:  No deformities, BUE and BLE strength normal and equal Skin: warm and dry  Neuro:  CNs 2-12 intact, no focal abnormalities noted Psych:  Normal affect   EKG:  The EKG was personally reviewed and demonstrates: Normal sinus rhythm, no significant ST-T wave changes  Telemetry:  Telemetry was personally reviewed and demonstrates: Sinus rhythm with occasional morphology changes  Relevant CV Studies:  Echo 08/18/2019 1. Left ventricular ejection fraction, by visual estimation, is 25 to  30%. The left ventricle has severely decreased function. Mildly increased  left ventricular size. There is mildly increased left ventricular  hypertrophy.  2. Left ventricular diastolic Doppler parameters are consistent with  impaired relaxation pattern of LV diastolic filling.  3. Global right ventricle has normal systolic function.The right  ventricular size is mildly enlarged.  4. Left atrial size was mildly dilated.  5. Right atrial size was normal.  6. The mitral valve is normal in structure. Mild mitral valve  regurgitation. No evidence of mitral stenosis.  7. The tricuspid valve is normal in structure. Tricuspid valve  regurgitation is mild.  8. The aortic valve is tricuspid Aortic valve regurgitation is mild by  color flow Doppler. Mild aortic valve sclerosis without stenosis.  9. The pulmonic valve was grossly normal. Pulmonic valve regurgitation is  trivial by color flow Doppler.  10. Aortic dilatation noted.  11. There is mild dilatation of the aortic root measuring 39 mm.  12. Mildly elevated pulmonary artery systolic pressure.  13. A  pacer wire is visualized.  14. The inferior vena cava is normal in size with greater than 50%  respiratory variability, suggesting right atrial pressure of 3 mmHg.  15. Severe LV systolic dysfunction; grade 1 diastolic dysfunction; mild  LVH and LVE; mild AI, MR and TR; mild LAE and RVE; mildly elevated  pulmonary pressure.   Laboratory Data:  High Sensitivity Troponin:   Recent Labs  Lab 12/29/19 1224  TROPONINIHS 23*     Chemistry Recent Labs  Lab 12/29/19 1224  NA 136  K 3.9  CL 101  CO2 26  GLUCOSE 112*  BUN 17  CREATININE 1.57*  CALCIUM 8.5*  GFRNONAA 41*  GFRAA 48*  ANIONGAP 9    No results for input(s): PROT, ALBUMIN, AST, ALT, ALKPHOS, BILITOT in the last 168 hours. Hematology Recent Labs  Lab 12/29/19 1224  WBC 7.4  RBC 3.95*  HGB 12.8*  HCT 39.2  MCV 99.2  MCH 32.4  MCHC 32.7  RDW 14.1  PLT 202   BNPNo results for input(s): BNP, PROBNP in the last 168 hours.  DDimer No results for input(s): DDIMER in the last 168 hours.   Radiology/Studies:  DG Chest 2 View  Result Date: 12/29/2019 CLINICAL DATA:  Cough. EXAM: CHEST - 2 VIEW COMPARISON:  09/14/2019.  07/29/2019.  09/16/1999 18. FINDINGS: Cardiac pacer with lead tip over the right atrium right ventricle. Stable cardiomegaly. Stable bilateral interstitial prominence, most likely chronic. Stable bilateral pleural thickening consistent with scarring. Atelectatic changes and/or scarring both upper lungs. IMPRESSION: 1.  Cardiac pacer stable position.  Stable cardiomegaly. 2.  Stable chronic interstitial changes. 3. Mild atelectatic changes and/or scarring both upper lungs. Chest is stable from prior exam. Electronically Signed   By: Marcello Moores  Register   On: 12/29/2019 12:49   {  Assessment and Plan:   1. Episodic weakness: He is receiving IV fluid for orthostatic hypotension.  Remote transmission from yesterday suggested he had at least 2 episode of VT both ATP paced.  We will get St [name] device rep to  interrogate his device tomorrow.  Continue amiodarone and Ranexa.  2. Chronic systolic heart failure: Euvolemic on physical exam.  He received more IV fluid while in the emergency room due to orthostatic hypotension.  Diuretic is currently on hold  3. History of VT s/p St [name] AICD: On amiodarone and Ranexa.  Require device interrogation tomorrow  4. Chronic atrial fibrillation on Eliquis  5. COPD on 3 L home oxygen: No sign of acute COPD exacerbation based on physical exam.  6. Hypertension  7. Hyperlipidemia      For questions or updates, please contact [place] Please consult [email or web address] for contact info under     Signed, Almyra Deforest, [place]  12/29/2019 7:18 PM  Personally seen and examined. Agree with above.   79 year old patient of Dr. [name] and Dr. Percival Spanish with nonischemic cardiomyopathy with EF 25%, ICD, [facility] [name], chronic/permanent atrial fibrillation on Eliquis, COPD on 3 L at home here with generalized weakness, malaise.  He was found to be orthostatic dropping from 120 down to 70 systolic in the ER.  Has had a few episodes of ventricular tachycardia recently with increasing frequency.  Benjaman Pott, NP  has ATP terminated VT documented.  Dr. [name] aware as well.  Here in the ER, lab work fairly unremarkable.  Creatinine is stable with chronic kidney disease stage IV.  Magnesium is pending.  On exam, comfortable currently laying down in bed, quite jovial.  Alert.  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  Lungs somewhat poor air movement bilaterally, nasal cannula oxygen, regular rate and rhythm with soft systolic murmur heard left lower sternal border, trace lower extremity edema.  Assessment and plan:  Orthostatic hypotension/generalized weakness -IV fluids will be gently given overnight.  He has received a bolus here in the emergency department.  Torsemide will be on hold. -Currently in sinus rhythm.  These episodes of  generalized weakness do not correlate one-to-one with his episodes of ventricular tachycardia.  Ventricular tachycardia nonsustained -He has had more frequent recent episodes of ventricular tachycardia last of which was documented in the chart under note section demonstrates ATP termination.  Potassium 3.9. -Continue with amiodarone as well as Ranexa which is being utilized as concomitant therapy for ventricular tachycardia not for angina. -Tomorrow  we will have [place] rep interrogate his ICD.  Nonischemic cardiomyopathy -EF has worsened when compared to prior echocardiogram from the last several years.  Unable to tolerate Entresto previously because of hypotension.  I am concerned about possibility of increased morbidity surrounding his current constellation of symptoms.  He voluntarily states that he is lived a good life and will be 24 in April.  Still remains very positive.    Candee Furbish, MD    Addendum: spoke with device rep, apparently patient sent over another remote transmission this morning at 8:30AM. Last VT episode was on 2/3, no VT episode on 2/4 and before 8:30AM today. Therefore his weakness may not be the result of VT but due to orthostatic hypotension and hypovolemia.

## 2019-12-29 NOTE — Telephone Encounter (Signed)
Labs reviewed with Dr [name] and report given on patient condition. Called patient and his wife and given DR Klein's recommendation to go to ED to be evaluated.recommended wife call 911 if patient is too weak to go to ED by POV. Patient agreed to go to hospital. ED charge [facility] RN contacted and given report.

## 2019-12-29 NOTE — ED Triage Notes (Signed)
Pt here from home with c/o weakness that started last night , pt doers have  History of vtach and is on home O2 otc

## 2019-12-29 NOTE — ED Provider Notes (Signed)
[facility] EMERGENCY DEPARTMENT Provider Note   CSN: [id number] Arrival date & time: 12/29/19  1200     History No chief complaint on file.   Corey Tyler is a 79 y.o. male.  HPI Patient presents to the emergency department with generalized weakness that started last night around 1030.  Patient states that he felt weak all over.  The patient states there is no specific area that is more weak than another.  The patient states that he did not take any medications other than his prescribed medications prior to arrival.  The patient states that he is not having any current pain anywhere.  The patient states that he has COPD and he does feel like his breathing is tight recently.  The patient states that he has been taking his medications as prescribed.  The patient denies chest pain, shortness of breath, headache,blurred vision, neck pain, fever, cough,  numbness, dizziness, anorexia, edema, abdominal pain, nausea, vomiting, diarrhea, rash, back pain, dysuria, hematemesis, bloody stool, near syncope, or syncope.    Past Medical History:  Diagnosis Date  . A-fib ([place])    a. Noted on 12/2012 interrogation, placed on Apixaban and ultimately stopped NOACs 2/2 personal decision 8/14.  Marland Kitchen AICD (automatic cardioverter/defibrillator) present    Cardiac defibrillator -dual  [employer]  . Arthritis   . Atrial tachycardia-non sustained    a. Noted on 03/2012 interrogation.  . Chronic systolic heart failure (HCC)    EF down to 20 to 25% per echo November 2012  . COPD (chronic obstructive pulmonary disease) ([place])   . Hyperlipidemia   . Hypertension   . ICD (implantable cardiac defibrillator) in place   . NICM (nonischemic cardiomyopathy) ([place])    a. Normal cors 2000. b. Minimal plaque 2013.  . Old myocardial infarction    a. ?Silent MI. b. Large fixed inferolateral defect c/w with prior infarct, no ischemia. c. Cath in 2000/2013 with only minimal CAD.  Marland Kitchen Presence of permanent  cardiac pacemaker   . Pulmonary nodule, right    Last scan in 2010 showing stability; felt to be benign.  Marland Kitchen PVC's (premature ventricular contractions)   . Ventricular tachycardia, polymorphic ([employer] [facility])    Rx via ICD 12/14    Patient Active Problem List   Diagnosis Date Noted  . Tremor 10/18/2019  . Hypomagnesemia 09/07/2019  . Hypokalemia 09/07/2019  . CKD (chronic kidney disease) stage 3, GFR 30-59 ml/min 09/02/2019  . SBO (small bowel obstruction) ([place]) 08/31/2019  . Chronic respiratory failure with hypoxia ([place]) 08/31/2019  . Chronic anticoagulation 08/31/2019  . Hypothyroidism 08/31/2019  . Chronic nausea 08/31/2019  . Generalized weakness 07/29/2019  . Acute respiratory failure ([place]) 09/15/2017  . COPD exacerbation ([place]) 09/15/2017  . Sustained ventricular tachycardia ([place]) 06/10/2017  . VT (ventricular tachycardia) ([place]) 11/02/2016  . Benign fibroma of prostate 04/02/2015  . Ventricular tachycardia, polymorphic ([place])   . Diverticulosis of colon without hemorrhage 09/25/2013  . AF (paroxysmal atrial fibrillation) ([place]) 12/29/2012  . Cardiac defibrillator -dual  [employer]   . Hyperlipidemia   . PVCs 11/26/2011  . Chronic systolic heart failure ([place]) 10/07/2011  . Cardiomyopathy, nonischemic and ischemic 09/24/2011  . 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 PULMONARY NODULE 12/04/2008    Past Surgical History:  Procedure Laterality Date  . APPENDECTOMY    . CARDIAC CATHETERIZATION  2000  . CARDIAC DEFIBRILLATOR PLACEMENT    . CARDIOVERSION  03/30/2013   Procedure: CARDIOVERSION;  Surgeon:  Thayer Headings, MD;  Location: [facility];  Service: Cardiovascular;;  . COLON SURGERY    . COLONOSCOPY N/A 09/25/2013   Procedure: COLONOSCOPY;  Surgeon: Jerene Bears, MD;  Location: [facility];  Service: Endoscopy;  Laterality: N/A;  . ICD  12/2011   [name]  . IMPLANTABLE CARDIOVERTER DEFIBRILLATOR IMPLANT N/A 01/06/2012   Procedure:  IMPLANTABLE CARDIOVERTER DEFIBRILLATOR IMPLANT;  Surgeon: Deboraha Sprang, MD;  Location: [facility] CATH LAB;  Service: Cardiovascular;  Laterality: N/A;  . PACEMAKER IMPLANT     [name]  . TEE WITHOUT CARDIOVERSION N/A 03/30/2013   Procedure: TRANSESOPHAGEAL ECHOCARDIOGRAM (TEE);  Surgeon: Thayer Headings, MD;  Location: [facility];  Service: Cardiovascular;  Laterality: N/A;  . V TACH ABLATION  06/10/2017  . V TACH ABLATION N/A 06/10/2017   Procedure: V Tach Ablation;  Surgeon: Evans Lance, MD;  Location: [facility] CV LAB;  Service: Cardiovascular;  Laterality: N/A;       Family History  Problem Relation Age of Onset  . Emphysema Mother   . Heart disease Mother        AVR  . Heart failure Father        Died agwe 31  . Esophageal cancer Brother   . Colon cancer Neg Hx     Social History   Tobacco Use  . Smoking status: Former Smoker    Packs/day: 2.50    Years: 40.00    Pack years: 100.00    Types: Cigarettes    Quit date: 11/24/1983    Years since quitting: 36.1  . Smokeless tobacco: Never Used  . Tobacco comment: 2 1/2 ppd x 40 years  Substance Use Topics  . Alcohol use: No  . Drug use: No    Home Medications Prior to Admission medications   Medication Sig Start Date End Date Taking? Authorizing Provider  acetaminophen (TYLENOL) 500 MG tablet Take 500 mg by mouth every 6 (six) hours as needed for headache (pain).   Yes [provider]  albuterol (PROVENTIL) (2.5 MG/3ML) 0.083% nebulizer solution Take 2.5 mg by nebulization every 6 (six) hours as needed for wheezing or shortness of breath.   Yes [provider]  albuterol (VENTOLIN HFA) 108 (90 Base) MCG/ACT inhaler Inhale 2 puffs into the lungs every 6 (six) hours as needed for wheezing or shortness of breath.   Yes [provider]  amiodarone (PACERONE) 200 MG tablet Take 2 tablets (400 mg total) by mouth daily. Take 400mg  by mouth daily for 1 month, then resume 200mg  by mouth daily  dose. Patient taking differently: Take 200 mg by mouth daily.  12/02/17  Yes Deboraha Sprang, MD  Carboxymethylcellul-Glycerin (LUBRICATING EYE DROPS OP) Place 1 drop into both eyes daily as needed (dry eyes).    Yes [provider]  ELIQUIS 5 MG TABS tablet TAKE 1 TABLET BY MOUTH TWICE A DAY Patient taking differently: Take 5 mg by mouth 2 (two) times daily.  07/30/15  Yes Minus Breeding, MD  finasteride (PROSCAR) 5 MG tablet Take 5 mg by mouth daily.    Yes [provider]  fluticasone (FLONASE) 50 MCG/ACT nasal spray Place 1 spray into both nostrils daily as needed for allergies.  09/08/17  Yes [provider]  nitroGLYCERIN (NITROSTAT) 0.4 MG SL tablet Place 0.4 mg under the tongue every 5 (five) minutes as needed for chest pain.  11/12/11  Yes Burtis Junes, NP  OXYGEN Inhale 3-6 L into the lungs daily. 3 at rest,  4.5-6 with exertion   Yes [provider]  cholecalciferol (VITAMIN D) 1000 UNITS tablet Take 1,000 Units by mouth daily.    [provider]  ezetimibe (ZETIA) 10 MG tablet Take 10 mg by mouth daily.  06/09/19   [provider]  Fluticasone-Salmeterol (ADVAIR DISKUS) 250-50 MCG/DOSE AEPB Inhale 1 puff into the lungs 2 (two) times daily as needed (shortness of breath/wheezing).     [provider]  levothyroxine (SYNTHROID, LEVOTHROID) 125 MCG tablet Take 125 mcg by mouth daily. 06/15/17   [provider]  loratadine (CLARITIN) 10 MG tablet Take 10 mg by mouth daily as needed for allergies.     [provider]  MAGNESIUM PO Take 1 tablet by mouth at bedtime.    [provider]  Melatonin 3 MG TABS Take 3 mg by mouth at bedtime.    [provider]  metoprolol tartrate (LOPRESSOR) 50 MG tablet HOLD. Discuss fatigue and symptoms with cardiologist 09/08/19   Desiree Hane, MD  Multiple Vitamin (MULTIVITAMIN WITH MINERALS) TABS tablet Take 1 tablet by mouth daily.    [provider]   nystatin (MYCOSTATIN) 100000 UNIT/ML suspension Use as directed 4 mLs in the mouth or throat 4 (four) times daily.  08/14/19   [provider]  Omega-3 Fatty Acids (FISH OIL) 500 MG CAPS Take 500 mg by mouth 2 (two) times daily.    [provider]  potassium chloride (K-DUR) 10 MEQ tablet Take 10 mEq by mouth at bedtime.     [provider]  promethazine (PHENERGAN) 25 MG tablet Take 25 mg by mouth daily as needed for nausea or vomiting.  04/20/17   [provider]  propranolol (INNOPRAN XL) 80 MG 24 hr capsule Take by mouth. 03/26/17   [provider]  Psyllium (METAMUCIL PO) Take 1 Dose by mouth daily. Mix in liquid and drink     [provider]  ranolazine (RANEXA) 1000 MG SR tablet Take 1 tablet (1,000 mg total) by mouth 2 (two) times daily. 09/23/18   Deboraha Sprang, MD  Tamsulosin HCl (FLOMAX) 0.4 MG CAPS Take 0.4 mg by mouth at bedtime.     [provider]  Tiotropium Bromide Monohydrate (SPIRIVA RESPIMAT) 2.5 MCG/ACT AERS Inhale 2 puffs into the lungs every morning.    [provider]  torsemide (DEMADEX) 20 MG tablet Take 1 tablet by mouth once daily Patient taking differently: Take 20-30 mg by mouth daily. 20 MG on Monday, Tuesday, Wednesday, Thursday, Friday 30 MG on Saturday and Sunday 06/20/19   Patsey Berthold, NP    Allergies    Atorvastatin, Statins, Xarelto [rivaroxaban], Adhesive [tape], Codeine, Isosorbide nitrate, Latex, Levofloxacin, Lisinopril, Lorazepam, Mexiletine, and Sertraline  Review of Systems   Review of Systems All other systems negative except as documented in the HPI. All pertinent positives and negatives as reviewed in the HPI. Physical Exam Updated Vital Signs BP 104/67 (BP Location: Right Arm)   Pulse 67   Temp (!) 97.5 F (36.4 C) (Oral)   Resp 18   SpO2 95%   Physical Exam Vitals and nursing note reviewed.  Constitutional:      General: He is not in acute distress.     Appearance: He is well-developed.  HENT:     Head: Normocephalic and atraumatic.  Eyes:     Pupils: Pupils are equal, round, and reactive to light.  Cardiovascular:     Rate and Rhythm: Normal rate and regular rhythm.  Heart sounds: Normal heart sounds. No murmur. No friction rub. No gallop.   Pulmonary:     Effort: Pulmonary effort is normal. No respiratory distress.     Breath sounds: Wheezing present.  Abdominal:     General: Bowel sounds are normal. There is no distension.     Palpations: Abdomen is soft.     Tenderness: There is no abdominal tenderness.  Musculoskeletal:     Cervical back: Normal range of motion and neck supple.  Skin:    General: Skin is warm and dry.     Capillary Refill: Capillary refill takes less than 2 seconds.     Findings: No erythema or rash.  Neurological:     Mental Status: He is alert and oriented to person, place, and time.     Motor: No abnormal muscle tone.     Coordination: Coordination normal.  Psychiatric:        Behavior: Behavior normal.     ED Results / Procedures / Treatments   Labs (all labs ordered are listed, but only abnormal results are displayed) Labs Reviewed  BASIC METABOLIC PANEL - Abnormal; Notable for the following components:      Result Value   Glucose, Bld 112 (*)    Creatinine, Ser 1.57 (*)    Calcium 8.5 (*)    GFR calc non Af Amer 41 (*)    GFR calc Af Amer 48 (*)    All other components within normal limits  CBC - Abnormal; Notable for the following components:   RBC 3.95 (*)    Hemoglobin 12.8 (*)    All other components within normal limits  TROPONIN I (HIGH SENSITIVITY) - Abnormal; Notable for the following components:   Troponin I (High Sensitivity) 23 (*)    All other components within normal limits  RESPIRATORY PANEL BY RT PCR (FLU A&B, COVID)  URINALYSIS, ROUTINE W REFLEX MICROSCOPIC  TROPONIN I (HIGH SENSITIVITY)    EKG EKG Interpretation  Date/Time:  Friday December 29 2019 12:08:38  EST Ventricular Rate:  70 PR Interval:  188 QRS Duration: 114 QT Interval:  436 QTC Calculation: 470 R Axis:   33 Text Interpretation: Normal sinus rhythm Cannot rule out Inferior infarct , age undetermined Abnormal ECG Confirmed by Pattricia Boss [phone]) on 12/29/2019 1:41:20 PM   Radiology DG Chest 2 View  Result Date: 12/29/2019 CLINICAL DATA:  Cough. EXAM: CHEST - 2 VIEW COMPARISON:  09/14/2019.  07/29/2019.  09/16/1999 18. FINDINGS: Cardiac pacer with lead tip over the right atrium right ventricle. Stable cardiomegaly. Stable bilateral interstitial prominence, most likely chronic. Stable bilateral pleural thickening consistent with scarring. Atelectatic changes and/or scarring both upper lungs. IMPRESSION: 1.  Cardiac pacer stable position.  Stable cardiomegaly. 2.  Stable chronic interstitial changes. 3. Mild atelectatic changes and/or scarring both upper lungs. Chest is stable from prior exam. Electronically Signed   By: Marcello Moores  Register   On: 12/29/2019 12:49    Procedures Procedures (including critical care time)  Medications Ordered in ED Medications  albuterol (VENTOLIN HFA) 108 (90 Base) MCG/ACT inhaler 4 puff (has no administration in time range)  sodium chloride flush (NS) 0.9 % injection 3 mL (3 mLs Intravenous Given 12/29/19 1251)  sodium chloride 0.9 % bolus 500 mL (0 mLs Intravenous Stopped 12/29/19 1457)    ED Course  I have reviewed the triage vital signs and the nursing notes.  Pertinent labs & imaging results that were available during my care of the patient were reviewed by me and  considered in my medical decision making (see chart for details).    MDM Rules/Calculators/A&P                     The patient is noted to be fairly hypotensive while standing.  He is given a small bolus of fluids but will most likely need further fluids and further evaluation of his generalized weakness.  Patient does have an extensive cardiac history.  Does have wheezing and tight breath  sounds bilaterally. Final Clinical Impression(s) / ED Diagnoses Final diagnoses:  None    Rx / DC Orders ED Discharge Orders    None       Dalia Heading, PA-C 12/29/19 1521    Pattricia Boss, MD 01/01/20 1045

## 2019-12-29 NOTE — Telephone Encounter (Signed)
Contacted Dr Julianne Rice office , [facility] and confirmed patient had CBC, CMP, magnesium, TSH, and vit D levels drawn 12/27/19. Lattie Haw provided fax # and will fax over the lab results.

## 2019-12-29 NOTE — Telephone Encounter (Signed)
Patient reports he does not feel well . He reports that since 12/28/19 he has been so weak that he has had to use his walker and can barely stand to get to the bathroom and kitchen. Reports that he has had increased SOB and an occasional cough. No fever, no known exposure to Covid-19 or recent Covid testing. No missed doses of amiodorone. Unaware that he received ATP on 12/27/19. Patient reports he took an extra dose of amiodorone 200 mg  and metoprolol 50 mg 1/2 tab last night. Reports he had labs drawn at Dr Julianne Rice office on 12/27/19 at [facility]. Remote transmission received from patient that showed device function WNL, current rhyhm at time of transmission was AP/VS at 66 bpm, No new episodes recorded since VT episode received on 12/27/19.

## 2019-12-29 NOTE — H&P (Addendum)
History and Physical    DOA: 12/29/2019  PCP: [name], MD  Patient coming from: home  Chief Complaint: Generalized weakness  HPI: Corey Tyler is a 79 y.o. male with history h/o chronic systolic CHF with low EF 25 to 30%, polymorphic VT s/p dual-chamber [employer]. [name]'s AICD/pacemaker, chronic atrial fibrillation on Eliquis, COPD/ILD with chronic respiratory failure on home O2 3 L, hypertension, hyperlipidemia, BPH who is on multiple cardiac meds at baseline and follows Dr. Colen Darling. Corey Tyler closely as outpatient presents with complaints of weakness and fatigue over the last few weeks.  Patient denies any new medication changes.  He states he is compliant with his medications/O2 via nasal cannula and quite independent at baseline.   He denies any chest pains or dyspnea per se.  Reports feeling drained out especially when he walks.  Patient noted to be orthostatic in the ED with supine systolic blood pressure in 120s and standing systolic blood pressure in the 70s.  He did get symptomatic and felt weak during orthostatic blood pressure check.  He received 500 mL fluid bolus in the ED.  He denies any complaints of palpitations but wife reports several episodes of NSVT noted on pacemaker/AICD home monitoring-Dr. Klein's office was apparently notified of this yesterday by [facility] [name]'s personnel.  His last formal pacemaker/AICD interrogation was apparently last year.  He denies any other symptoms of fever, chills or nausea or vomiting or dysuria or urinary retention.   Lab work-up in the ED unremarkable, chest x-ray negative.  UA unremarkable.  Given concern for continued orthostasis, patient requested to be admitted for gentle hydration overnight and cardiology evaluation.   Review of Systems: As per HPI otherwise 10 point review of systems negative.    Past Medical History:  Diagnosis Date  . A-fib ([place])    a. Noted on 12/2012 interrogation, placed on Apixaban and ultimately stopped NOACs 2/2 personal  decision 8/14.  Marland Kitchen AICD (automatic cardioverter/defibrillator) present    Cardiac defibrillator -dual  [employer] Judes  . Arthritis   . Atrial tachycardia-non sustained    a. Noted on 03/2012 interrogation.  . Chronic systolic heart failure (HCC)    EF down to 20 to 25% per echo November 2012  . COPD (chronic obstructive pulmonary disease) ([place])   . Hyperlipidemia   . Hypertension   . ICD (implantable cardiac defibrillator) in place   . NICM (nonischemic cardiomyopathy) ([place])    a. Normal cors 2000. b. Minimal plaque 2013.  . Old myocardial infarction    a. ?Silent MI. b. Large fixed inferolateral defect c/w with prior infarct, no ischemia. c. Cath in 2000/2013 with only minimal CAD.  Marland Kitchen Presence of permanent cardiac pacemaker   . Pulmonary nodule, right    Last scan in 2010 showing stability; felt to be benign.  Marland Kitchen PVC's (premature ventricular contractions)   . Ventricular tachycardia, polymorphic ([place])    Rx via ICD 12/14    Past Surgical History:  Procedure Laterality Date  . APPENDECTOMY    . CARDIAC CATHETERIZATION  2000  . CARDIAC DEFIBRILLATOR PLACEMENT    . CARDIOVERSION  03/30/2013   Procedure: CARDIOVERSION;  Surgeon: Thayer Headings, MD;  Location: [place];  Service: Cardiovascular;;  . COLON SURGERY    . COLONOSCOPY N/A 09/25/2013   Procedure: COLONOSCOPY;  Surgeon: Jerene Bears, MD;  Location: [facility];  Service: Endoscopy;  Laterality: N/A;  . ICD  12/2011   Corey Tyler  . IMPLANTABLE CARDIOVERTER DEFIBRILLATOR IMPLANT N/A 01/06/2012   Procedure: IMPLANTABLE  CARDIOVERTER DEFIBRILLATOR IMPLANT;  Surgeon: Deboraha Sprang, MD;  Location: [facility] CATH LAB;  Service: Cardiovascular;  Laterality: N/A;  . PACEMAKER IMPLANT     St [name]  . TEE WITHOUT CARDIOVERSION N/A 03/30/2013   Procedure: TRANSESOPHAGEAL ECHOCARDIOGRAM (TEE);  Surgeon: Thayer Headings, MD;  Location: [place];  Service: Cardiovascular;  Laterality: N/A;  . V TACH ABLATION  06/10/2017  . V TACH ABLATION N/A  06/10/2017   Procedure: V Tach Ablation;  Surgeon: Evans Lance, MD;  Location: [place] CV LAB;  Service: Cardiovascular;  Laterality: N/A;    Social history:  reports that he quit smoking about 36 years ago. His smoking use included cigarettes. He has a 100.00 pack-year smoking history. He has never used smokeless tobacco. He reports that he does not drink alcohol or use drugs.   Allergies  Allergen Reactions  . Atorvastatin Shortness Of Breath and Cough  . Statins Shortness Of Breath and Other (See Comments)    Muscle and joing pain and "These bother my lungs and make my gait unsteady, also"  . Xarelto [Rivaroxaban] Other (See Comments)    Bleeding  . Adhesive [Tape] Hives, Itching and Rash  . Codeine Nausea Only  . Isosorbide Nitrate Other (See Comments)    Made him feel bad  . Latex Itching and Other (See Comments)    Rash, itching, burning  . Levofloxacin Other (See Comments)    Tachycardia  . Lisinopril Cough  . Lorazepam Other (See Comments)    "felt bad"  . Mexiletine Other (See Comments)    GI distress  . Sertraline Other (See Comments)    Made him feel bad    Family History  Problem Relation Age of Onset  . Emphysema Mother   . Heart disease Mother        AVR  . Heart failure Father        Died agwe 62  . Esophageal cancer Brother   . Colon cancer Neg Hx       Prior to Admission medications   Medication Sig Start Date End Date Taking? Authorizing Provider  acetaminophen (TYLENOL) 500 MG tablet Take 500 mg by mouth every 6 (six) hours as needed for headache (pain).   Yes [provider]  albuterol (PROVENTIL) (2.5 MG/3ML) 0.083% nebulizer solution Take 2.5 mg by nebulization every 6 (six) hours as needed for wheezing or shortness of breath.   Yes [provider]  albuterol (VENTOLIN HFA) 108 (90 Base) MCG/ACT inhaler Inhale 2 puffs into the lungs every 6 (six) hours as needed for wheezing or shortness of breath.   Yes [provider]  amiodarone (PACERONE) 200 MG tablet Take 2 tablets (400 mg total) by mouth daily. Take 400mg  by mouth daily for 1 month, then resume 200mg  by mouth daily dose. Patient taking differently: Take 200 mg by mouth daily.  12/02/17  Yes Deboraha Sprang, MD  Camphor (VICKS VAPO STEAM IN) Inhale 1 application into the lungs at bedtime as needed (to open the lungs and for nasal congestion).    Yes [provider]  Carboxymethylcellul-Glycerin (LUBRICATING EYE DROPS OP) Place 1 drop into both eyes daily as needed (dry eyes).    Yes [provider]  ELIQUIS 5 MG TABS tablet TAKE 1 TABLET BY MOUTH TWICE A DAY Patient taking differently: Take 5 mg by mouth 2 (two) times daily.  07/30/15  Yes Minus Breeding, MD  finasteride (PROSCAR) 5 MG tablet Take 5 mg by mouth daily.  Yes [provider]  fluticasone (FLONASE) 50 MCG/ACT nasal spray Place 1 spray into both nostrils daily as needed for allergies.  09/08/17  Yes [provider]  Fluticasone-Salmeterol (ADVAIR DISKUS) 250-50 MCG/DOSE AEPB Inhale 1 puff into the lungs 2 (two) times daily as needed (shortness of breath/wheezing).    Yes [provider]  levothyroxine (SYNTHROID, LEVOTHROID) 125 MCG tablet Take 125 mcg by mouth daily before breakfast.  06/15/17  Yes [provider]  MAGNESIUM PO Take 1 tablet by mouth at bedtime.   Yes [provider]  Melatonin 3 MG TABS Take 3 mg by mouth at bedtime.   Yes [provider]  metoprolol tartrate (LOPRESSOR) 50 MG tablet HOLD. Discuss fatigue and symptoms with cardiologist Patient taking differently: Take 25 mg by mouth 2 (two) times daily.  09/08/19  Yes Oretha Milch D, MD  nitroGLYCERIN (NITROSTAT) 0.4 MG SL tablet Place 0.4 mg under the tongue every 5 (five) minutes as needed for chest pain.  11/12/11  Yes Burtis Junes, NP  Omega-3 Fatty Acids (FISH OIL) 500 MG CAPS Take 500 mg by mouth 2 (two) times daily.   Yes [provider]  OXYGEN Inhale 3-6 L into the lungs See admin instructions. Inhale 3 L/min continuously at rest and 4.5-6 L/min with exertion   Yes [provider]  Pediatric Multivit-Minerals-C (FLINTSTONES GUMMIES COMPLETE) CHEW Chew 1 tablet by mouth daily with breakfast.   Yes [provider]  POTASSIUM PO Take 1 tablet by mouth at bedtime.   Yes [provider]  promethazine (PHENERGAN) 25 MG tablet Take 25 mg by mouth daily as needed for nausea or vomiting.  04/20/17  Yes [provider]  Psyllium (METAMUCIL PO) Take 1 Dose by mouth daily. Mix in liquid and drink    Yes [provider]  ranolazine (RANEXA) 1000 MG SR tablet Take 1 tablet (1,000 mg total) by mouth 2 (two) times daily. 09/23/18  Yes Deboraha Sprang, MD  Tamsulosin HCl (FLOMAX) 0.4 MG CAPS Take 0.4 mg by mouth at bedtime.    Yes [provider]  Tiotropium Bromide Monohydrate (SPIRIVA RESPIMAT) 2.5 MCG/ACT AERS Inhale 2 puffs into the lungs every morning.   Yes [provider]  torsemide (DEMADEX) 20 MG tablet Take 1 tablet by mouth once daily Patient taking differently: Take 20-30 mg by mouth daily. Take 30 mg by mouth on Sun/Sat and 20 mg on Mon/Tues/Wed/Thurs/Fri 06/20/19  Yes Seiler, [employer] K, NP  cholecalciferol (VITAMIN D) 1000 UNITS tablet Take 1,000 Units by mouth daily.    [provider]  ezetimibe (ZETIA) 10 MG tablet Take 10 mg by mouth daily.  06/09/19   [provider]  loratadine (CLARITIN) 10 MG tablet Take 10 mg by mouth daily as needed for allergies.     [provider]  nystatin (MYCOSTATIN) 100000 UNIT/ML suspension Use as directed 4 mLs in the mouth or throat 4 (four) times daily.  08/14/19   [provider]  propranolol (INNOPRAN XL) 80 MG 24 hr capsule Take by mouth. 03/26/17   [provider]    Physical Exam: Vitals:   12/29/19 1218  BP: 104/67  Pulse: 67  Resp: 18  Temp: (!) 97.5 F (36.4 C)   TempSrc: Oral  SpO2: 95%    Constitutional: NAD, calm, comfortable Eyes: PERRL, lids and conjunctivae normal ENMT: Mucous membranes are moist. Posterior pharynx clear of any exudate or lesions.Normal dentition.  Neck: normal, supple, no masses, no thyromegaly  Respiratory: clear to auscultation bilaterally, no wheezing, no crackles. Normal respiratory effort. No accessory muscle use.  Cardiovascular: Regular rate and rhythm, no murmurs / rubs / gallops.  Trace lower extremity pitting edema. 2+ pedal pulses. No carotid bruits.  Abdomen: no tenderness, no masses palpated. No hepatosplenomegaly. Bowel sounds positive.  Musculoskeletal: no clubbing / cyanosis. No joint deformity upper and lower extremities. Good ROM, no contractures. Normal muscle tone.  Neurologic: CN 2-12 grossly intact. Sensation intact, DTR normal. Strength 5/5 in all 4.  Psychiatric: Normal judgment and insight. Alert and oriented x 3. Normal mood.  SKIN/catheters: no rashes, lesions, ulcers. No induration  Labs on Admission: I have personally reviewed following labs and imaging studies  CBC: Recent Labs  Lab 12/29/19 1224  WBC 7.4  HGB 12.8*  HCT 39.2  MCV 99.2  PLT 123XX123   Basic Metabolic Panel: Recent Labs  Lab 12/29/19 1224  NA 136  K 3.9  CL 101  CO2 26  GLUCOSE 112*  BUN 17  CREATININE 1.57*  CALCIUM 8.5*   GFR: CrCl cannot be calculated (Unknown ideal weight.). Recent Labs  Lab 12/29/19 1224  WBC 7.4   Liver Function Tests: No results for input(s): AST, ALT, ALKPHOS, BILITOT, PROT, ALBUMIN in the last 168 hours. No results for input(s): LIPASE, AMYLASE in the last 168 hours. No results for input(s): AMMONIA in the last 168 hours. Coagulation Profile: No results for input(s): INR, PROTIME in the last 168 hours. Cardiac Enzymes: No results for input(s): CKTOTAL, CKMB, CKMBINDEX, TROPONINI in the last 168 hours. BNP (last 3 results) No results for input(s): PROBNP in the last 8760  hours. HbA1C: No results for input(s): HGBA1C in the last 72 hours. CBG: No results for input(s): GLUCAP in the last 168 hours. Lipid Profile: No results for input(s): CHOL, HDL, LDLCALC, TRIG, CHOLHDL, LDLDIRECT in the last 72 hours. Thyroid Function Tests: No results for input(s): TSH, T4TOTAL, FREET4, T3FREE, THYROIDAB in the last 72 hours. Anemia Panel: No results for input(s): VITAMINB12, FOLATE, FERRITIN, TIBC, IRON, RETICCTPCT in the last 72 hours. Urine analysis:    Component Value Date/Time   COLORURINE YELLOW 12/29/2019 [place] 12/29/2019 1458   LABSPEC 1.008 12/29/2019 1458   PHURINE 6.0 12/29/2019 1458   GLUCOSEU NEGATIVE 12/29/2019 1458   HGBUR NEGATIVE 12/29/2019 1458   BILIRUBINUR NEGATIVE 12/29/2019 1458   KETONESUR NEGATIVE 12/29/2019 1458   PROTEINUR NEGATIVE 12/29/2019 1458   UROBILINOGEN 0.2 07/18/2015 1154   NITRITE NEGATIVE 12/29/2019 1458   LEUKOCYTESUR NEGATIVE 12/29/2019 1458    Radiological Exams on Admission: Personally reviewed  DG Chest 2 View  Result Date: 12/29/2019 CLINICAL DATA:  Cough. EXAM: CHEST - 2 VIEW COMPARISON:  09/14/2019.  07/29/2019.  09/16/1999 18. FINDINGS: Cardiac pacer with lead tip over the right atrium right ventricle. Stable cardiomegaly. Stable bilateral interstitial prominence, most likely chronic. Stable bilateral pleural thickening consistent with scarring. Atelectatic changes and/or scarring both upper lungs. IMPRESSION: 1.  Cardiac pacer stable position.  Stable cardiomegaly. 2.  Stable chronic interstitial changes. 3. Mild atelectatic changes and/or scarring both upper lungs. Chest is stable from prior exam. Electronically Signed   By: Marcello Moores  Register   On: 12/29/2019 12:49    EKG: Independently reviewed.  Normal sinus rhythm with QTC 470 ms     Assessment and Plan:   Principal Problem:   Orthostatic hypotension Active Problems:   NSVT (nonsustained ventricular tachycardia) (HCC)   Generalized  weakness   Chronic systolic heart failure (HCC)  Hyperlipidemia   Chronic respiratory failure with hypoxia (HCC)   Hypothyroidism   CKD (chronic kidney disease) stage 3, GFR 30-59 ml/min   Hypomagnesemia    1.  Orthostatic hypotension with generalized weakness: In the setting of low EF and multiple cardiac meds.  Will hold torsemide for tonight.  We will continue IV hydration at 50 mL/h.  Support stockings advised.  Patient states his prostrate issues were considered to be "mild".  Will hold tamsulosin that can cause orthostasis as well.  Continue finasteride for now.  Continue other cardiac meds including metoprolol, amiodarone and Ranexa (although Ranexa could contribute to orthostasis).  His home medications list shows propranolol but wife confirmed that patient no longer takes this medication.  Check TSH.  Covid PCR negative  2.  Polymorphic V. tach s/p [employer] [name] AICD: Patient apparently been having multiple episodes of nonsustained V. tach.  Cardiology office notes/comment by Dr. Caryl Tyler noted.  AICD will need to be interrogated.  Requested cardiology evaluation while patient here as not sure if this could be contributing to his weakness.  Last echo in September/2020 showed EF 25 to 30%.  Dr. Marlou Porch advised to continue Ranexa for now as he thinks it might have been prescribed for polymorphic V. tach rather than chronic angina.  Resume magnesium supplements  3.  Chronic systolic CHF with low EF: Appears to be compensated overall.  Only has trace leg edema.  Holding diuretics and concern for problem #1.  Not on ACE inhibitors due to problem #5.  4.  Paroxysmal atrial fibrillation: Currently in sinus rhythm.  Resume amiodarone/metoprolol.  5. CKD stage IIIb: Patient's baseline creatinine in the setting of chronic diuretics appears to be around 1.4-1.7.  Not on ACE inhibitors.  Hold torsemide for tonight and gentle hydration as mentioned above.  Labs in a.m.  6.  COPD/restrictive lung disease  due to agent orange exposure: Patient uses 3 to 4 L of O2 at baseline.  Resume the same.  Resume home inhalers  7.  Hypothyroidism: Resume home meds.  Check TSH.  8. Hypomagnesemia: Resume home meds.  Check magnesium level.  Replace as needed.  DVT prophylaxis: On anticoagulation  COVID screen: Negative  Code Status: Full code .Health care proxy would be his wife Hassan Rowan  Patient/Family Communication: Discussed with patient and wife Hassan Rowan, all questions answered to satisfaction.  Consults called: Call placed to [facility] cardiology, discussed with Dr. Marlou Porch.   Admission status :Patient will be admitted under OBSERVATION status.The patient's presenting symptoms, physical exam findings, and initial radiographic and laboratory data in the context of their medical condition is felt to place them at low risk for further clinical deterioration. Furthermore, it is anticipated that the patient will be medically stable for discharge from the hospital within 2 midnights of hospital stay.      Guilford Shi MD [facility] Pager in [place]  If 7PM-7AM, please contact night-coverage [email or web address]   12/29/2019, 6:47 PM

## 2019-12-29 NOTE — Progress Notes (Addendum)
[address] [name] rep [phone], informed St rep to interrogate device tomorrow.    See addendum to H&P, last VT episode was on 2/3, no VT last night until 8:30 AM this morning to explain his weakness

## 2019-12-30 DIAGNOSIS — E785 Hyperlipidemia, unspecified: Secondary | ICD-10-CM | POA: Diagnosis not present

## 2019-12-30 DIAGNOSIS — I951 Orthostatic hypotension: Secondary | ICD-10-CM | POA: Diagnosis not present

## 2019-12-30 DIAGNOSIS — I4891 Unspecified atrial fibrillation: Secondary | ICD-10-CM | POA: Diagnosis not present

## 2019-12-30 DIAGNOSIS — Z95810 Presence of automatic (implantable) cardiac defibrillator: Secondary | ICD-10-CM | POA: Diagnosis not present

## 2019-12-30 DIAGNOSIS — I5022 Chronic systolic (congestive) heart failure: Secondary | ICD-10-CM | POA: Diagnosis not present

## 2019-12-30 DIAGNOSIS — J9611 Chronic respiratory failure with hypoxia: Secondary | ICD-10-CM | POA: Diagnosis not present

## 2019-12-30 DIAGNOSIS — R531 Weakness: Secondary | ICD-10-CM | POA: Diagnosis not present

## 2019-12-30 DIAGNOSIS — I472 Ventricular tachycardia: Secondary | ICD-10-CM | POA: Diagnosis not present

## 2019-12-30 LAB — BASIC METABOLIC PANEL
Anion gap: 8 (ref 5–15)
BUN: 19 mg/dL (ref 8–23)
CO2: 27 mmol/L (ref 22–32)
Calcium: 8.4 mg/dL — ABNORMAL LOW (ref 8.9–10.3)
Chloride: 102 mmol/L (ref 98–111)
Creatinine, Ser: 1.34 mg/dL — ABNORMAL HIGH (ref 0.61–1.24)
GFR calc Af Amer: 58 mL/min — ABNORMAL LOW (ref >60)
GFR calc non Af Amer: 50 mL/min — ABNORMAL LOW (ref >60)
Glucose, Bld: 91 mg/dL (ref 70–99)
Potassium: 3.8 mmol/L (ref 3.5–5.1)
Sodium: 137 mmol/L (ref 135–145)

## 2019-12-30 NOTE — Progress Notes (Signed)
Recent vitals done, iv sites checked no bleeding.denies pain or sob. Dc instructions reviewed with patient and wife.

## 2019-12-30 NOTE — Progress Notes (Signed)
PT Cancellation Note  Patient Details Name: Corey Tyler MRN: [id number] DOB: 1940/12/01   Cancelled Treatment:    Reason Eval/Treat Not Completed: Patient declined to have PT evaluate prior to discharge per MD discharge summary. Stating he feels strong and does not need therapy.   Arby Barrette, PT Pager [phone]    Rexanne Mano 12/30/2019, 11:24 AM

## 2019-12-30 NOTE — Discharge Instructions (Signed)
Weakness Weakness is a lack of strength. You may feel weak all over your body (generalized), or you may feel weak in one part of your body (focal). There are many potential causes of weakness. Sometimes, the cause of your weakness may not be known. Some causes of weakness can be serious, so it is important to see your doctor. Follow these instructions at home: Activity  Rest as needed.  Try to get enough sleep. Most adults need 7-8 hours of sleep each night. Talk to your doctor about how much sleep you need each night.  Do exercises, such as arm curls and leg raises, for 30 minutes at least 2 days a week or as told by your doctor.  Think about working with a physical therapist or trainer to help you get stronger. General instructions   Take over-the-counter and prescription medicines only as told by your doctor.  Eat a healthy, well-balanced diet. This includes: ? Proteins to build muscles, such as lean meats and fish. ? Fresh fruits and vegetables. ? Carbohydrates to boost energy, such as whole grains.  Drink enough fluid to keep your pee (urine) pale yellow.  Keep all follow-up visits as told by your doctor. This is important. Contact a doctor if:  Your weakness does not get better or it gets worse.  Your weakness affects your ability to: ? Think clearly. ? Do your normal daily activities. Get help right away if you:  Have sudden weakness on one side of your face or body.  Have chest pain.  Have trouble breathing or shortness of breath.  Have problems with your vision.  Have trouble talking or swallowing.  Have trouble standing or walking.  Are light-headed.  Pass out (lose consciousness). Summary  Weakness is a lack of strength. You may feel weak all over your body or just in one part of your body.  There are many potential causes of weakness. Sometimes, the cause of your weakness may not be known.  Rest as needed, and try to get enough sleep. Most adults  need 7-8 hours of sleep each night.  Eat a healthy, well-balanced diet. This information is not intended to replace advice given to you by your health care provider. Make sure you discuss any questions you have with your health care provider. Document Revised: 06/15/2018 Document Reviewed: 06/15/2018 Elsevier Patient Education  [place] on my medicine - ELIQUIS (apixaban)  Why was Eliquis prescribed for you? Eliquis was prescribed for you to reduce the risk of a blood clot forming that can cause a stroke if you have a medical condition called atrial fibrillation (a type of irregular heartbeat).  What do You need to know about Eliquis ? Take your Eliquis TWICE DAILY - one tablet in the morning and one tablet in the evening with or without food. If you have difficulty swallowing the tablet whole please discuss with your pharmacist how to take the medication safely.  Take Eliquis exactly as prescribed by your doctor and DO NOT stop taking Eliquis without talking to the doctor who prescribed the medication.  Stopping may increase your risk of developing a stroke.  Refill your prescription before you run out.  After discharge, you should have regular check-up appointments with your healthcare provider that is prescribing your Eliquis.  In the future your dose may need to be changed if your kidney function or weight changes by a significant amount or as you get older.  What do you do if you miss  a dose? If you miss a dose, take it as soon as you remember on the same day and resume taking twice daily.  Do not take more than one dose of ELIQUIS at the same time to make up a missed dose.  Important Safety Information A possible side effect of Eliquis is bleeding. You should call your healthcare provider right away if you experience any of the following: ? Bleeding from an injury or your nose that does not stop. ? Unusual colored urine (red or dark brown) or unusual  colored stools (red or black). ? Unusual bruising for unknown reasons. ? A serious fall or if you hit your head (even if there is no bleeding).  Some medicines may interact with Eliquis and might increase your risk of bleeding or clotting while on Eliquis. To help avoid this, consult your healthcare provider or pharmacist prior to using any new prescription or non-prescription medications, including herbals, vitamins, non-steroidal anti-inflammatory drugs (NSAIDs) and supplements.  This website has more information on Eliquis (apixaban): http://www.eliquis.com/eliquis/home

## 2019-12-30 NOTE — Progress Notes (Signed)
Per patient's wife, referral ID number from the [place] needs to be placed in patient's chart. Number given to this RN by wife is [id number].

## 2019-12-30 NOTE — Discharge Summary (Signed)
Physician Discharge Summary  Corey Tyler [id number] DOB: 08/29/1941 DOA: 12/29/2019  PCP: [name], MD  Admit date: 12/29/2019 Discharge date: 12/30/2019  Admitted From: Home Disposition: Home  Recommendations for Outpatient Follow-up:  1. Follow up with PCP in 1-2 weeks 2. Follow with cardiology as a scheduled 3. Please obtain BMP/CBC in one week 4. Please follow up on the following pending results:  Home Health: None Equipment/Devices: None  Discharge Condition: Stable CODE STATUS: Full code Diet recommendation: Cardiac  Subjective: Seen and examined.  No complaints.  Feels much better.  No more dizziness or weakness.  Wants to go home.  HPI: Corey Tyler is a 79 y.o. male with history h/o chronic systolic CHF with low EF 25 to 30%, polymorphic VT s/p dual-chamber [name] AICD/pacemaker, chronic atrial fibrillation on Eliquis, COPD/ILD with chronic respiratory failure on home O2 3 L, hypertension, hyperlipidemia, BPH who is on multiple cardiac meds at baseline and follows Dr. Colen Darling. [name] closely as outpatient presents with complaints of weakness and fatigue over the last few weeks.  Patient denies any new medication changes.  He states he is compliant with his medications/O2 via nasal cannula and quite independent at baseline.   He denies any chest pains or dyspnea per se.  Reports feeling drained out especially when he walks.  Patient noted to be orthostatic in the ED with supine systolic blood pressure in 120s and standing systolic blood pressure in the 70s.  He did get symptomatic and felt weak during orthostatic blood pressure check.  He received 500 mL fluid bolus in the ED.  He denies any complaints of palpitations but wife reports several episodes of NSVT noted on pacemaker/AICD home monitoring-Dr. Klein's office was apparently notified of this yesterday by [name] personnel.  His last formal pacemaker/AICD interrogation was apparently last year.  He denies any  other symptoms of fever, chills or nausea or vomiting or dysuria or urinary retention.   Lab work-up in the ED unremarkable, chest x-ray negative.  UA unremarkable.  Given concern for continued orthostasis, patient requested to be admitted for gentle hydration overnight and cardiology evaluation.   Brief/Interim Summary: Patient presented with weakness and dizziness.  He was found to orthostatic hypotension.  There was suspicion that his dizziness could very well be due to ataxia however cardiology did AICD interrogation and there was no V. tach on 12/28/2019 so cardiology said that patient's symptoms of dizziness and weakness is likely secondary to orthostatic hypotension and not V. tach and did not recommend changing anything.  Patient received some IV fluids in the emergency department.  His blood pressure has improved significantly and so he is is symptoms.  Cardiology has seen patient this morning and has cleared the patient and has advised to resume all his home medications including beta-blocker, amiodarone as well as diuretics.  When talking to patient, he also wants to go home.  I offered him to be seen by PT so we can assess if he will need any home health however patient refused that offer and does not want any home health at this point in time as he believes that he is a strong and is requesting to discharge so he will be discharged home today with follow-up with PCP as well as cardiology.  Of note, he has chronic hypoxic respiratory failure for which he uses 3 to 5 L of nasal oxygen and currently he is at his baseline.  Discharge Diagnoses:  Principal Problem:   Orthostatic hypotension Active  Problems:   Chronic systolic heart failure (HCC)   Hyperlipidemia   ICD (implantable cardioverter-defibrillator) in place   Atrial fibrillation (HCC)   Generalized weakness   Chronic respiratory failure with hypoxia (HCC)   Hypothyroidism   CKD (chronic kidney disease) stage 3, GFR 30-59 ml/min    Hypomagnesemia   NSVT (nonsustained ventricular tachycardia) [facility])    Discharge Instructions  Discharge Instructions    Discharge patient   Complete by: As directed    Discharge disposition: 01-Home or Self Care   Discharge patient date: 12/30/2019     Allergies as of 12/30/2019      Reactions   Atorvastatin Shortness Of Breath, Cough   Statins Shortness Of Breath, Other (See Comments)   Muscle and joing pain and "These bother my lungs and make my gait unsteady, also"   Xarelto [rivaroxaban] Other (See Comments)   Bleeding   Adhesive [tape] Hives, Itching, Rash   Codeine Nausea Only   Isosorbide Nitrate Other (See Comments)   Made him feel bad   Latex Itching, Other (See Comments)   Rash, itching, burning   Levofloxacin Other (See Comments)   Tachycardia   Lisinopril Cough   Lorazepam Other (See Comments)   "felt bad"   Mexiletine Other (See Comments)   GI distress   Sertraline Other (See Comments)   Made him feel bad      Medication List    STOP taking these medications   propranolol 80 MG 24 hr capsule Commonly known as: INNOPRAN XL     TAKE these medications   acetaminophen 500 MG tablet Commonly known as: TYLENOL Take 500 mg by mouth every 6 (six) hours as needed for headache (pain).   Advair Diskus 250-50 MCG/DOSE Aepb Generic drug: Fluticasone-Salmeterol Inhale 1 puff into the lungs 2 (two) times daily as needed (shortness of breath/wheezing).   albuterol 108 (90 Base) MCG/ACT inhaler Commonly known as: VENTOLIN HFA Inhale 2 puffs into the lungs every 6 (six) hours as needed for wheezing or shortness of breath.   albuterol (2.5 MG/3ML) 0.083% nebulizer solution Commonly known as: PROVENTIL Take 2.5 mg by nebulization every 6 (six) hours as needed for wheezing or shortness of breath.   amiodarone 200 MG tablet Commonly known as: PACERONE Take 2 tablets (400 mg total) by mouth daily. Take 400mg  by mouth daily for 1 month, then resume 200mg  by mouth  daily dose. What changed:   how much to take  additional instructions   cholecalciferol 1000 units tablet Commonly known as: VITAMIN D Take 1,000 Units by mouth daily.   Eliquis 5 MG Tabs tablet Generic drug: apixaban TAKE 1 TABLET BY MOUTH TWICE A DAY What changed: how much to take   ezetimibe 10 MG tablet Commonly known as: ZETIA Take 10 mg by mouth daily.   finasteride 5 MG tablet Commonly known as: PROSCAR Take 5 mg by mouth daily.   Fish Oil 500 MG Caps Take 500 mg by mouth 2 (two) times daily.   Flintstones Gummies Complete Chew Chew 1 tablet by mouth daily with breakfast.   Flomax 0.4 MG Caps capsule Generic drug: tamsulosin Take 0.4 mg by mouth at bedtime.   fluticasone 50 MCG/ACT nasal spray Commonly known as: FLONASE Place 1 spray into both nostrils daily as needed for allergies.   levothyroxine 125 MCG tablet Commonly known as: SYNTHROID Take 125 mcg by mouth daily before breakfast.   loratadine 10 MG tablet Commonly known as: CLARITIN Take 10 mg by mouth daily as needed  for allergies.   LUBRICATING EYE DROPS OP Place 1 drop into both eyes daily as needed (dry eyes).   MAGNESIUM PO Take 1 tablet by mouth at bedtime.   Melatonin 3 MG Tabs Take 3 mg by mouth at bedtime.   METAMUCIL PO Take 1 Dose by mouth daily. Mix in liquid and drink   metoprolol tartrate 50 MG tablet Commonly known as: LOPRESSOR HOLD. Discuss fatigue and symptoms with cardiologist What changed:   how much to take  how to take this  when to take this  additional instructions   nitroGLYCERIN 0.4 MG SL tablet Commonly known as: NITROSTAT Place 0.4 mg under the tongue every 5 (five) minutes as needed for chest pain.   nystatin 100000 UNIT/ML suspension Commonly known as: MYCOSTATIN Use as directed 4 mLs in the mouth or throat 4 (four) times daily.   OXYGEN Inhale 3-6 L into the lungs See admin instructions. Inhale 3 L/min continuously at rest and 4.5-6 L/min with  exertion   POTASSIUM PO Take 1 tablet by mouth at bedtime.   promethazine 25 MG tablet Commonly known as: PHENERGAN Take 25 mg by mouth daily as needed for nausea or vomiting.   ranolazine 1000 MG SR tablet Commonly known as: RANEXA Take 1 tablet (1,000 mg total) by mouth 2 (two) times daily.   Spiriva Respimat 2.5 MCG/ACT Aers Generic drug: Tiotropium Bromide Monohydrate Inhale 2 puffs into the lungs every morning.   torsemide 20 MG tablet Commonly known as: DEMADEX Take 1 tablet by mouth once daily What changed:   how much to take  additional instructions   VICKS VAPO STEAM IN Inhale 1 application into the lungs at bedtime as needed (to open the lungs and for nasal congestion).      Follow-up Information    [name], MD Follow up in 1 week(s).   Specialty: Internal Medicine Contact information: [place] [address] [phone]        Minus Breeding, MD .   Specialty: Cardiology Contact information: [address] [place] [address] [phone]          Allergies  Allergen Reactions  . Atorvastatin Shortness Of Breath and Cough  . Statins Shortness Of Breath and Other (See Comments)    Muscle and joing pain and "These bother my lungs and make my gait unsteady, also"  . Xarelto [Rivaroxaban] Other (See Comments)    Bleeding  . Adhesive [Tape] Hives, Itching and Rash  . Codeine Nausea Only  . Isosorbide Nitrate Other (See Comments)    Made him feel bad  . Latex Itching and Other (See Comments)    Rash, itching, burning  . Levofloxacin Other (See Comments)    Tachycardia  . Lisinopril Cough  . Lorazepam Other (See Comments)    "felt bad"  . Mexiletine Other (See Comments)    GI distress  . Sertraline Other (See Comments)    Made him feel bad    Consultations: Cardiology   Procedures/Studies: DG Chest 2 View  Result Date: 12/29/2019 CLINICAL DATA:  Cough. EXAM: CHEST - 2 VIEW COMPARISON:   09/14/2019.  07/29/2019.  09/16/1999 18. FINDINGS: Cardiac pacer with lead tip over the right atrium right ventricle. Stable cardiomegaly. Stable bilateral interstitial prominence, most likely chronic. Stable bilateral pleural thickening consistent with scarring. Atelectatic changes and/or scarring both upper lungs. IMPRESSION: 1.  Cardiac pacer stable position.  Stable cardiomegaly. 2.  Stable chronic interstitial changes. 3. Mild atelectatic changes and/or scarring  both upper lungs. Chest is stable from prior exam. Electronically Signed   By: Marcello Moores  Register   On: 12/29/2019 12:49      Discharge Exam: Vitals:   12/30/19 0328 12/30/19 0921  BP: 115/62 118/68  Pulse: 61 63  Resp: 18   Temp: 98.1 F (36.7 C)   SpO2: 93%    Vitals:   12/29/19 2014 12/29/19 2300 12/30/19 0328 12/30/19 0921  BP: 122/67 132/75 115/62 118/68  Pulse: (!) 59 (!) 59 61 63  Resp: 17 18 18    Temp: 97.8 F (36.6 C)  98.1 F (36.7 C)   TempSrc: Oral  Oral   SpO2: 93% 92% 93%   Weight: 95.3 kg  97.3 kg   Height: 6\' 1"  (1.854 m)       General: Pt is alert, awake, not in acute distress Cardiovascular: RRR, S1/S2 +, no rubs, no gallops Respiratory: Diminished breath sounds at bases, no wheezing, no rhonchi Abdominal: Soft, NT, ND, bowel sounds + Extremities: no edema, no cyanosis    The results of significant diagnostics from this hospitalization (including imaging, microbiology, ancillary and laboratory) are listed below for reference.     Microbiology: Recent Results (from the past 240 hour(s))  Respiratory Panel by RT PCR (Flu A&B, Covid) - Nasopharyngeal Swab     Status: None   Collection Time: 12/29/19  2:57 PM   Specimen: Nasopharyngeal Swab  Result Value Ref Range Status   SARS Coronavirus 2 by RT PCR NEGATIVE NEGATIVE Final    Comment: (NOTE) SARS-CoV-2 target nucleic acids are NOT DETECTED. The SARS-CoV-2 RNA is generally detectable in upper respiratoy specimens during the acute phase of  infection. The lowest concentration of SARS-CoV-2 viral copies this assay can detect is 131 copies/mL. A negative result does not preclude SARS-Cov-2 infection and should not be used as the sole basis for treatment or other patient management decisions. A negative result may occur with  improper specimen collection/handling, submission of specimen other than nasopharyngeal swab, presence of viral mutation(s) within the areas targeted by this assay, and inadequate number of viral copies (<131 copies/mL). A negative result must be combined with clinical observations, patient history, and epidemiological information. The expected result is Negative. Fact Sheet for Patients:  PinkCheek.be Fact Sheet for Healthcare Providers:  GravelBags.it This test is not yet ap proved or cleared by the [place] FDA and  has been authorized for detection and/or diagnosis of SARS-CoV-2 by FDA under an Emergency Use Authorization (EUA). This EUA will remain  in effect (meaning this test can be used) for the duration of the COVID-19 declaration under Section 564(b)(1) of the Act, 21 U.S.C. section 360bbb-3(b)(1), unless the authorization is terminated or revoked sooner.    Influenza A by PCR NEGATIVE NEGATIVE Final   Influenza B by PCR NEGATIVE NEGATIVE Final    Comment: (NOTE) The Xpert Xpress SARS-CoV-2/FLU/RSV assay is intended as an aid in  the diagnosis of influenza from Nasopharyngeal swab specimens and  should not be used as a sole basis for treatment. Nasal washings and  aspirates are unacceptable for Xpert Xpress SARS-CoV-2/FLU/RSV  testing. Fact Sheet for Patients: PinkCheek.be Fact Sheet for Healthcare Providers: GravelBags.it This test is not yet approved or cleared by the [place] FDA and  has been authorized for detection and/or diagnosis of SARS-CoV-2 by  FDA under  an Emergency Use Authorization (EUA). This EUA will remain  in effect (meaning this test can be used) for the duration of the  Covid-19 declaration under Section  564(b)(1) of the Act, 21  U.S.C. section 360bbb-3(b)(1), unless the authorization is  terminated or revoked. Performed at [facility], [place] [address] [place], [place] [address]      Labs: BNP (last 3 results) Recent Labs    04/13/19 1506 07/29/19 1514  BNP 111.1* [id number]   Basic Metabolic Panel: Recent Labs  Lab 12/29/19 1224 12/29/19 2031 12/30/19 0725  NA 136  --  137  K 3.9  --  3.8  CL 101  --  102  CO2 26  --  27  GLUCOSE 112*  --  91  BUN 17  --  19  CREATININE 1.57*  --  1.34*  CALCIUM 8.5*  --  8.4*  MG  --  2.0  --    Liver Function Tests: No results for input(s): AST, ALT, ALKPHOS, BILITOT, PROT, ALBUMIN in the last 168 hours. No results for input(s): LIPASE, AMYLASE in the last 168 hours. No results for input(s): AMMONIA in the last 168 hours. CBC: Recent Labs  Lab 12/29/19 1224  WBC 7.4  HGB 12.8*  HCT 39.2  MCV 99.2  PLT 202   Cardiac Enzymes: No results for input(s): CKTOTAL, CKMB, CKMBINDEX, TROPONINI in the last 168 hours. BNP: Invalid input(s): POCBNP CBG: No results for input(s): GLUCAP in the last 168 hours. D-Dimer No results for input(s): DDIMER in the last 72 hours. Hgb A1c No results for input(s): HGBA1C in the last 72 hours. Lipid Profile No results for input(s): CHOL, HDL, LDLCALC, TRIG, CHOLHDL, LDLDIRECT in the last 72 hours. Thyroid function studies Recent Labs    12/29/19 2031  TSH 1.485   Anemia work up No results for input(s): VITAMINB12, FOLATE, FERRITIN, TIBC, IRON, RETICCTPCT in the last 72 hours. Urinalysis    Component Value Date/Time   COLORURINE YELLOW 12/29/2019 1458   APPEARANCEUR CLEAR 12/29/2019 1458   LABSPEC 1.008 12/29/2019 1458   PHURINE 6.0 12/29/2019 1458   GLUCOSEU NEGATIVE 12/29/2019 1458   HGBUR NEGATIVE 12/29/2019 1458    BILIRUBINUR NEGATIVE 12/29/2019 1458   KETONESUR NEGATIVE 12/29/2019 1458   PROTEINUR NEGATIVE 12/29/2019 1458   UROBILINOGEN 0.2 07/18/2015 1154   NITRITE NEGATIVE 12/29/2019 1458   LEUKOCYTESUR NEGATIVE 12/29/2019 1458   Sepsis Labs Invalid input(s): PROCALCITONIN,  WBC,  LACTICIDVEN Microbiology Recent Results (from the past 240 hour(s))  Respiratory Panel by RT PCR (Flu A&B, Covid) - Nasopharyngeal Swab     Status: None   Collection Time: 12/29/19  2:57 PM   Specimen: Nasopharyngeal Swab  Result Value Ref Range Status   SARS Coronavirus 2 by RT PCR NEGATIVE NEGATIVE Final    Comment: (NOTE) SARS-CoV-2 target nucleic acids are NOT DETECTED. The SARS-CoV-2 RNA is generally detectable in upper respiratoy specimens during the acute phase of infection. The lowest concentration of SARS-CoV-2 viral copies this assay can detect is 131 copies/mL. A negative result does not preclude SARS-Cov-2 infection and should not be used as the sole basis for treatment or other patient management decisions. A negative result may occur with  improper specimen collection/handling, submission of specimen other than nasopharyngeal swab, presence of viral mutation(s) within the areas targeted by this assay, and inadequate number of viral copies (<131 copies/mL). A negative result must be combined with clinical observations, patient history, and epidemiological information. The expected result is Negative. Fact Sheet for Patients:  PinkCheek.be Fact Sheet for Healthcare Providers:  GravelBags.it This test is not yet ap proved or cleared by the [place] FDA and  has been  authorized for detection and/or diagnosis of SARS-CoV-2 by FDA under an Emergency Use Authorization (EUA). This EUA will remain  in effect (meaning this test can be used) for the duration of the COVID-19 declaration under Section 564(b)(1) of the Act, 21 U.S.C. section  360bbb-3(b)(1), unless the authorization is terminated or revoked sooner.    Influenza A by PCR NEGATIVE NEGATIVE Final   Influenza B by PCR NEGATIVE NEGATIVE Final    Comment: (NOTE) The Xpert Xpress SARS-CoV-2/FLU/RSV assay is intended as an aid in  the diagnosis of influenza from Nasopharyngeal swab specimens and  should not be used as a sole basis for treatment. Nasal washings and  aspirates are unacceptable for Xpert Xpress SARS-CoV-2/FLU/RSV  testing. Fact Sheet for Patients: PinkCheek.be Fact Sheet for Healthcare Providers: GravelBags.it This test is not yet approved or cleared by the [place] FDA and  has been authorized for detection and/or diagnosis of SARS-CoV-2 by  FDA under an Emergency Use Authorization (EUA). This EUA will remain  in effect (meaning this test can be used) for the duration of the  Covid-19 declaration under Section 564(b)(1) of the Act, 21  U.S.C. section 360bbb-3(b)(1), unless the authorization is  terminated or revoked. Performed at [facility], [place] [address], [place], [place] [address]      Time coordinating discharge: Over 30 minutes  SIGNED:   Darliss Cheney, MD  [facility] 12/30/2019, 10:11 AM  If 7PM-7AM, please contact night-coverage [email or web address]

## 2019-12-30 NOTE — Progress Notes (Signed)
Progress Note  Patient Name: Corey Tyler Date of Encounter: 12/30/2019  Primary Cardiologist: Minus Breeding, MD   Subjective   Feeling much better.  No more weakness or dizziness.  Denies chest pain and palpitations.  Wants to go home.  Inpatient Medications    Scheduled Meds: . amiodarone  200 mg Oral Daily  . apixaban  5 mg Oral BID  . cholecalciferol  1,000 Units Oral Daily  . ezetimibe  10 mg Oral Daily  . finasteride  5 mg Oral Daily  . levothyroxine  125 mcg Oral QAC breakfast  . magnesium oxide   Oral QHS  . Melatonin  3 mg Oral QHS  . metoprolol tartrate  25 mg Oral BID  . mometasone-formoterol  2 puff Inhalation BID  . omega-3 acid ethyl esters  1 g Oral BID  . psyllium  1 packet Oral Daily  . ranolazine  1,000 mg Oral BID  . umeclidinium bromide  1 puff Inhalation Daily   Continuous Infusions:  PRN Meds: acetaminophen, fluticasone, loratadine, nitroGLYCERIN, ondansetron **OR** ondansetron (ZOFRAN) IV, polyvinyl alcohol, promethazine   Vital Signs    Vitals:   12/29/19 1530 12/29/19 2014 12/29/19 2300 12/30/19 0328  BP: 116/63 122/67 132/75 115/62  Pulse: 61 (!) 59 (!) 59 61  Resp: 19 17 18 18   Temp:  97.8 F (36.6 C)  98.1 F (36.7 C)  TempSrc:  Oral  Oral  SpO2: 99% 93% 92% 93%  Weight:  95.3 kg  97.3 kg  Height:  6\' 1"  (1.854 m)      Intake/Output Summary (Last 24 hours) at 12/30/2019 0807 Last data filed at 12/30/2019 [id number] Gross per 24 hour  Intake 809.04 ml  Output 550 ml  Net 259.04 ml   Filed Weights   12/29/19 2014 12/30/19 0328  Weight: 95.3 kg 97.3 kg    Telemetry    Sinus rhythm with isolated PVC and intermittent pacing- Personally Reviewed   Physical Exam   GEN: No acute distress.   Neck: No JVD Cardiac: RRR, no murmurs, rubs, or gallops.  Respiratory: Clear to auscultation bilaterally. GI: Soft, nontender, non-distended  MS: No edema; No deformity. Neuro:  Nonfocal  Psych: Normal affect   Labs    Chemistry Recent  Labs  Lab 12/29/19 1224  NA 136  K 3.9  CL 101  CO2 26  GLUCOSE 112*  BUN 17  CREATININE 1.57*  CALCIUM 8.5*  GFRNONAA 41*  GFRAA 48*  ANIONGAP 9     Hematology Recent Labs  Lab 12/29/19 1224  WBC 7.4  RBC 3.95*  HGB 12.8*  HCT 39.2  MCV 99.2  MCH 32.4  MCHC 32.7  RDW 14.1  PLT 202    Cardiac EnzymesNo results for input(s): TROPONINI in the last 168 hours. No results for input(s): TROPIPOC in the last 168 hours.   BNPNo results for input(s): BNP, PROBNP in the last 168 hours.   DDimer No results for input(s): DDIMER in the last 168 hours.   Radiology    DG Chest 2 View  Result Date: 12/29/2019 CLINICAL DATA:  Cough. EXAM: CHEST - 2 VIEW COMPARISON:  09/14/2019.  07/29/2019.  09/16/1999 18. FINDINGS: Cardiac pacer with lead tip over the right atrium right ventricle. Stable cardiomegaly. Stable bilateral interstitial prominence, most likely chronic. Stable bilateral pleural thickening consistent with scarring. Atelectatic changes and/or scarring both upper lungs. IMPRESSION: 1.  Cardiac pacer stable position.  Stable cardiomegaly. 2.  Stable chronic interstitial changes. 3. Mild atelectatic changes and/or  scarring both upper lungs. Chest is stable from prior exam. Electronically Signed   By: Marcello Moores  Register   On: 12/29/2019 12:49    Cardiac Studies   Relevant CV Studies:  Echo 08/18/2019 1. Left ventricular ejection fraction, by visual estimation, is 25 to  30%. The left ventricle has severely decreased function. Mildly increased  left ventricular size. There is mildly increased left ventricular  hypertrophy.  2. Left ventricular diastolic Doppler parameters are consistent with  impaired relaxation pattern of LV diastolic filling.  3. Global right ventricle has normal systolic function.The right  ventricular size is mildly enlarged.  4. Left atrial size was mildly dilated.  5. Right atrial size was normal.  6. The mitral valve is normal in structure. Mild  mitral valve  regurgitation. No evidence of mitral stenosis.  7. The tricuspid valve is normal in structure. Tricuspid valve  regurgitation is mild.  8. The aortic valve is tricuspid Aortic valve regurgitation is mild by  color flow Doppler. Mild aortic valve sclerosis without stenosis.  9. The pulmonic valve was grossly normal. Pulmonic valve regurgitation is  trivial by color flow Doppler.  10. Aortic dilatation noted.  11. There is mild dilatation of the aortic root measuring 39 mm.  12. Mildly elevated pulmonary artery systolic pressure.  13. A pacer wire is visualized.  14. The inferior vena cava is normal in size with greater than 50%  respiratory variability, suggesting right atrial pressure of 3 mmHg.  15. Severe LV systolic dysfunction; grade 1 diastolic dysfunction; mild  LVH and LVE; mild AI, MR and TR; mild LAE and RVE; mildly elevated  pulmonary pressure.    Patient Profile     79 y.o. male with a hx of chronic systolic heart failure, history of polymorphic VT s/p dual-chamber St [name] AICD, chronic atrial fibrillation on Eliquis, COPD on 3 L home O2, hypertension, and hyperlipidemia who is being seen today for the evaluation of weakness at the request of Dr. Earnest Conroy.  Assessment & Plan    1.  Episodic weakness: He has received IV fluids for orthostatic hypotension.  Weakness does not appear to directly correlate with ventricular tachycardia.  2.  Chronic systolic heart failure/nonischemic cardiomyopathy: Currently euvolemic.  Received IV fluids.  Can resume diuretics at time of discharge.  Currently on metoprolol tartrate.  He did not tolerate Entresto as it led to symptomatic hypotension.  3.  Ventricular tachycardia/AICD: Currently on amiodarone and Ranexa.  Representative was contacted last night with last VT episode on 12/27/2019 and no VT episodes on 12/28/2019.  This further contributes to the idea that episodic weakness is from orthostatic hypotension and not from  ventricular tachycardia.  4.  COPD: On 3 L of oxygen at home.  5.  Hypertension: Blood pressure is normal.  6.  Hyperlipidemia: Continue statin therapy.  7.  Atrial fibrillation: Currently on Eliquis and metoprolol.   [facility] will sign off.   Medication Recommendations:  As above Other recommendations (labs, testing, etc):  NA Follow up as an outpatient:  Will arrange  For questions or updates, please contact [place] Please consult [email or web address] for contact info under Cardiology/STEMI.      Signed, Kate Sable, MD  12/30/2019, 8:07 AM

## 2020-01-14 NOTE — Progress Notes (Signed)
Cardiology Office Note   Date:  01/15/2020   ID:  Richardd, Joyner 06-Jan-1941, MRN [id number]  PCP:  [name], MD  Cardiologist: Dr. Percival Spanish  CC: Follow up    History of Present Illness: OCIE KUPER is a 79 y.o. male who presents ongoing assessment and management of chronic systolic heart failure, history of polymorphic VT status post dual-chamber [facility] [name] AICD in situ, chronic atrial fibrillation on Eliquis, hypertension, hyperlipidemia, with other history to include COPD which is oxygen dependent on 3 L of home O2.  The patient was last seen during consultation in the hospital in February 2021 for complaints of episodic weakness and orthostatic hypotension.  It was not found that the weakness correlated with ventricular tachycardia.  The patient was found to be euvolemic after receiving IV fluids.  Delene Loll was discontinued as it was causing significant hypotension.  Diuretics were resumed at discharge.  He was also continued on amiodarone and Ranexa.  ICD was interrogated on 12/28/2019 which revealed no episodes of VT.  He was continued on Eliquis for atrial fibrillation, metoprolol for heart rate control, O2 and antihypertensives.  Mr. Damewood since leaving the hospital his had episodes of very low blood pressure with weakness.  He stopped taking torsemide as directed as she felt this was dropping his blood pressure too much causing dizziness and significant fatigue.  He has been weighing himself every day avoiding salt and has not gained any weight or had any further symptoms after stopping the diuretic.  He states he will take the diuretic as needed for weight gain and has had to take 1 dose since discharge.  He also decreased his metoprolol on his own to 25 mg in the a.m. and has eliminated the p.m. dose.  He states he was feeling much too tired during the day after taking the higher doses.  He does not feel heart racing, he does not feel short of breath or some tachycardia associated  with exercise.  He is adamant about maintaining this medication regimen as he is feeling much better since changing it on his own.  He has not had an appointment with Dr. [name] as scheduled as he was hospitalized the end of January into February.  He will need to have a follow-up appointment made for in person visit as he was unable to make his annual appointment.  Past Medical History:  Diagnosis Date  . A-fib ([place])    a. Noted on 12/2012 interrogation, placed on Apixaban and ultimately stopped NOACs 2/2 personal decision 8/14.  Marland Kitchen AICD (automatic cardioverter/defibrillator) present    Cardiac defibrillator -dual  St Judes  . Arthritis   . Atrial tachycardia-non sustained    a. Noted on 03/2012 interrogation.  . Chronic systolic heart failure (HCC)    EF down to 20 to 25% per echo November 2012  . COPD (chronic obstructive pulmonary disease) ([place])   . Hyperlipidemia   . Hypertension   . ICD (implantable cardiac defibrillator) in place   . NICM (nonischemic cardiomyopathy) ([place])    a. Normal cors 2000. b. Minimal plaque 2013.  . Old myocardial infarction    a. ?Silent MI. b. Large fixed inferolateral defect c/w with prior infarct, no ischemia. c. Cath in 2000/2013 with only minimal CAD.  Marland Kitchen Presence of permanent cardiac pacemaker   . Pulmonary nodule, right    Last scan in 2010 showing stability; felt to be benign.  Marland Kitchen PVC's (premature ventricular contractions)   . Ventricular tachycardia, polymorphic ([place])  Rx via ICD 12/14    Past Surgical History:  Procedure Laterality Date  . APPENDECTOMY    . CARDIAC CATHETERIZATION  2000  . CARDIAC DEFIBRILLATOR PLACEMENT    . CARDIOVERSION  03/30/2013   Procedure: CARDIOVERSION;  Surgeon: Thayer Headings, MD;  Location: [facility];  Service: Cardiovascular;;  . COLON SURGERY    . COLONOSCOPY N/A 09/25/2013   Procedure: COLONOSCOPY;  Surgeon: Jerene Bears, MD;  Location: [facility] ENDOSCOPY;  Service: Endoscopy;  Laterality: N/A;  . ICD  12/2011    [name]  . IMPLANTABLE CARDIOVERTER DEFIBRILLATOR IMPLANT N/A 01/06/2012   Procedure: IMPLANTABLE CARDIOVERTER DEFIBRILLATOR IMPLANT;  Surgeon: Deboraha Sprang, MD;  Location: [facility] CATH LAB;  Service: Cardiovascular;  Laterality: N/A;  . PACEMAKER IMPLANT     St [name]  . TEE WITHOUT CARDIOVERSION N/A 03/30/2013   Procedure: TRANSESOPHAGEAL ECHOCARDIOGRAM (TEE);  Surgeon: Thayer Headings, MD;  Location: [facility];  Service: Cardiovascular;  Laterality: N/A;  . V TACH ABLATION  06/10/2017  . V TACH ABLATION N/A 06/10/2017   Procedure: V Tach Ablation;  Surgeon: Evans Lance, MD;  Location: [facility] CV LAB;  Service: Cardiovascular;  Laterality: N/A;     Current Outpatient Medications  Medication Sig Dispense Refill  . acetaminophen (TYLENOL) 500 MG tablet Take 500 mg by mouth every 6 (six) hours as needed for headache (pain).    Marland Kitchen albuterol (PROVENTIL) (2.5 MG/3ML) 0.083% nebulizer solution Take 2.5 mg by nebulization every 6 (six) hours as needed for wheezing or shortness of breath.    Marland Kitchen albuterol (VENTOLIN HFA) 108 (90 Base) MCG/ACT inhaler Inhale 2 puffs into the lungs every 6 (six) hours as needed for wheezing or shortness of breath.    Marland Kitchen amiodarone (PACERONE) 200 MG tablet Take 200 mg by mouth daily.    . Camphor (VICKS VAPO STEAM IN) Inhale 1 application into the lungs at bedtime as needed (to open the lungs and for nasal congestion).     . Carboxymethylcellul-Glycerin (LUBRICATING EYE DROPS OP) Place 1 drop into both eyes daily as needed (dry eyes).     . cholecalciferol (VITAMIN D) 1000 UNITS tablet Take 1,000 Units by mouth daily.    Marland Kitchen ELIQUIS 5 MG TABS tablet TAKE 1 TABLET BY MOUTH TWICE A DAY (Patient taking differently: Take 5 mg by mouth 2 (two) times daily. ) 60 tablet 5  . ezetimibe (ZETIA) 10 MG tablet Take 10 mg by mouth daily.     . finasteride (PROSCAR) 5 MG tablet Take 5 mg by mouth daily.     . fluticasone (FLONASE) 50 MCG/ACT nasal spray Place 1 spray into both nostrils  daily as needed for allergies.     . Fluticasone-Salmeterol (ADVAIR DISKUS) 250-50 MCG/DOSE AEPB Inhale 1 puff into the lungs 2 (two) times daily as needed (shortness of breath/wheezing).     Marland Kitchen levothyroxine (SYNTHROID, LEVOTHROID) 125 MCG tablet Take 125 mcg by mouth daily before breakfast.     . loratadine (CLARITIN) 10 MG tablet Take 10 mg by mouth daily as needed for allergies.     Marland Kitchen MAGNESIUM PO Take 1 tablet by mouth at bedtime.    . Melatonin 3 MG TABS Take 3 mg by mouth at bedtime.    . metoprolol tartrate (LOPRESSOR) 25 MG tablet Take 25 mg by mouth daily.     . nitroGLYCERIN (NITROSTAT) 0.4 MG SL tablet Place 0.4 mg under the tongue every 5 (five) minutes as needed for chest pain.     Marland Kitchen  nystatin (MYCOSTATIN) 100000 UNIT/ML suspension Use as directed 4 mLs in the mouth or throat 4 (four) times daily.     . Omega-3 Fatty Acids (FISH OIL) 500 MG CAPS Take 500 mg by mouth 2 (two) times daily.    . OXYGEN Inhale 3-6 L into the lungs See admin instructions. Inhale 3 L/min continuously at rest and 4.5-6 L/min with exertion    . Pediatric Multivit-Minerals-C (FLINTSTONES GUMMIES COMPLETE) CHEW Chew 1 tablet by mouth daily with breakfast.    . POTASSIUM PO Take 1 tablet by mouth at bedtime.    . promethazine (PHENERGAN) 25 MG tablet Take 25 mg by mouth daily as needed for nausea or vomiting.     . Psyllium (METAMUCIL PO) Take 1 Dose by mouth daily. Mix in liquid and drink     . ranolazine (RANEXA) 1000 MG SR tablet Take 1 tablet (1,000 mg total) by mouth 2 (two) times daily. 180 tablet 3  . Tamsulosin HCl (FLOMAX) 0.4 MG CAPS Take 0.4 mg by mouth at bedtime.     . Tiotropium Bromide Monohydrate (SPIRIVA RESPIMAT) 2.5 MCG/ACT AERS Inhale 2 puffs into the lungs every morning.    . torsemide (DEMADEX) 20 MG tablet Take 20 mg by mouth as needed.     No current facility-administered medications for this visit.    Allergies:   Atorvastatin, Statins, Xarelto [rivaroxaban], Adhesive [tape], Codeine,  Isosorbide nitrate, Latex, Levofloxacin, Lisinopril, Lorazepam, Mexiletine, and Sertraline    Social History:  The patient  reports that he quit smoking about 36 years ago. His smoking use included cigarettes. He has a 100.00 pack-year smoking history. He has never used smokeless tobacco. He reports that he does not drink alcohol or use drugs.   Family History:  The patient's family history includes Emphysema in his mother; Esophageal cancer in his brother; Heart disease in his mother; Heart failure in his father.    ROS: All other systems are reviewed and negative. Unless otherwise mentioned in H&P    PHYSICAL EXAM: VS:  BP 108/60   Pulse 62   Ht 6\' 1"  (1.854 m)   Wt 216 lb (98 kg)   BMI 28.50 kg/m  , BMI Body mass index is 28.5 kg/m. GEN: Well nourished, well developed, in no acute distress HEENT: normal Neck: no JVD, carotid bruits, or masses Cardiac:RRR; occasional extrasystole, diastolic murmurs heard best at the left sternal border, rubs, or gallops,no edema  Respiratory:  Clear to auscultation bilaterally, normal work of breathing GI: soft, nontender, nondistended, + BS MS: no deformity or atrophy Skin: warm and dry, no rash Neuro:  Strength and sensation are intact Psych: euthymic mood, full affect   EKG: Not completed this office visit.  Recent Labs: 07/29/2019: B Natriuretic Peptide 66.6 09/13/2019: ALT 23 12/29/2019: Hemoglobin 12.8; Magnesium 2.0; Platelets 202; TSH 1.485 12/30/2019: BUN 19; Creatinine, Ser 1.34; Potassium 3.8; Sodium 137    Lipid Panel    Component Value Date/Time   CHOL 113 04/17/2014 0854   TRIG 58.0 04/17/2014 0854   HDL 49.80 04/17/2014 0854   CHOLHDL 2 04/17/2014 0854   VLDL 11.6 04/17/2014 0854   LDLCALC 52 04/17/2014 0854      Wt Readings from Last 3 Encounters:  01/15/20 216 lb (98 kg)  12/30/19 214 lb 6.4 oz (97.3 kg)  11/08/19 215 lb (97.5 kg)      Other studies Reviewed: Echo 08/18/2019 1. Left ventricular ejection  fraction, by visual estimation, is 25 to  30%. The left ventricle has  severely decreased function. Mildly increased  left ventricular size. There is mildly increased left ventricular  hypertrophy.  2. Left ventricular diastolic Doppler parameters are consistent with  impaired relaxation pattern of LV diastolic filling.  3. Global right ventricle has normal systolic function.The right  ventricular size is mildly enlarged.  4. Left atrial size was mildly dilated.  5. Right atrial size was normal.  6. The mitral valve is normal in structure. Mild mitral valve  regurgitation. No evidence of mitral stenosis.  7. The tricuspid valve is normal in structure. Tricuspid valve  regurgitation is mild.  8. The aortic valve is tricuspid Aortic valve regurgitation is mild by  color flow Doppler. Mild aortic valve sclerosis without stenosis.  9. The pulmonic valve was grossly normal. Pulmonic valve regurgitation is  trivial by color flow Doppler.  10. Aortic dilatation noted.  11. There is mild dilatation of the aortic root measuring 39 mm.  12. Mildly elevated pulmonary artery systolic pressure.  13. A pacer wire is visualized.  14. The inferior vena cava is normal in size with greater than 50%  respiratory variability, suggesting right atrial pressure of 3 mmHg.  15. Severe LV systolic dysfunction; grade 1 diastolic dysfunction; mild  LVH and LVE; mild AI, MR and TR; mild LAE and RVE; mildly elevated  pulmonary pressure.    ASSESSMENT AND PLAN:  1.  Ischemic cardiomyopathy: Most recent echocardiogram revealing an EF of 25%.  He currently has CRT-D in situ, and has not had any discharges.  He continues to have remote interrogation.  He will need to have an appointment with Dr. [name] in person as he missed it during recent hospitalization for annual visit.  He was unable to tolerate Entresto causing significant hypotension and therefore this was discontinued.  He remains on metoprolol but  has decreased the dose on his own to 25 mg in the a.m. and eliminated the p.m. dose.  He rarely feels his heart rate go up and that occurs when he is walking long distances.  He states if he does feel his heart rate go up he will take an additional dose of metoprolol which is rare.  I have cautioned him about self manipulation of medications however he appears to be able to reach his body very well and he is a symptomatic with current doses.  He apparently was feeling worse on higher doses of metoprolol and had become less active.  ICD interrogation will give [place] further information concerning arrhythmias and heart rate on average during histogram evaluation.  This may help [place] to direct him better on making better choices concerning his dosing of medication.  2.  Chronic mixed CHF: Mr. Middaugh has not taken torsemide as directed stating that this was making him feel tired, dehydrated, lightheaded, and dizzy.  He is now only taking it as needed.  He is weighing daily, avoiding salt, and states that he drinks 8 glasses of water a day.  I have cautioned him on his fluid intake to avoid fluid overload with reduced EF.  He states that he takes the torsemide if he gains weight and is only had to do that once since being discharged.  If he finds that he is taking as needed doses of torsemide more often, we may need to change it to furosemide which he can take daily which will continue to keep his fluid balance without causing significant symptoms as he feels when he is taking the more potent torsemide.  3.  Atrial fibrillation:  Heart rate is currently well controlled on metoprolol, although he had decreased it to 25 mg daily.  I am concerned that he is not manipulating his medications and may not be aware of irregular heart rhythm or rate control other than when he becomes symptomatic with racing heart rate.  I am interested in reviewing the interrogation from EP to evaluate histogram concerning his heart rate control,  atrial fib burden when available.  He will continue Eliquis 5 mg twice daily as directed.  4.  O2 dependent COPD: Followed by pulmonology.  States his breathing status has been consistent.  He only notices some shortness of breath when he is exerting himself.  He has not noticed any resting dyspnea.    Current medicines are reviewed at length with the patient today.  I have spent 35 minutes dedicated to the care of this patient on the date of this encounter to include pre-visit review of records, assessment, management and diagnostic testing,with shared decision making.  Labs/ tests ordered today include: 3-4 months with Dr.Hochrein.  ASAP with Dr. [name] .   Phill Myron. West Pugh, ANP, AACC   01/15/2020 2:21 PM    [facility] [address] Office [phone] Fax [phone]  Notice: This dictation was prepared with Dragon dictation along with smaller phrase technology. Any transcriptional errors that result from this process are unintentional and may not be corrected upon review.

## 2020-01-15 ENCOUNTER — Ambulatory Visit (INDEPENDENT_AMBULATORY_CARE_PROVIDER_SITE_OTHER): Payer: PPO | Admitting: Adult Health

## 2020-01-15 ENCOUNTER — Encounter: Payer: Self-pay | Admitting: Adult Health

## 2020-01-15 ENCOUNTER — Other Ambulatory Visit: Payer: Self-pay

## 2020-01-15 VITALS — BP 108/60 | HR 62 | Ht 73.0 in | Wt 216.0 lb

## 2020-01-15 DIAGNOSIS — I482 Chronic atrial fibrillation, unspecified: Secondary | ICD-10-CM | POA: Diagnosis not present

## 2020-01-15 DIAGNOSIS — Z95810 Presence of automatic (implantable) cardiac defibrillator: Secondary | ICD-10-CM

## 2020-01-15 DIAGNOSIS — I5042 Chronic combined systolic (congestive) and diastolic (congestive) heart failure: Secondary | ICD-10-CM | POA: Diagnosis not present

## 2020-01-15 DIAGNOSIS — J438 Other emphysema: Secondary | ICD-10-CM | POA: Diagnosis not present

## 2020-01-15 DIAGNOSIS — I472 Ventricular tachycardia: Secondary | ICD-10-CM | POA: Diagnosis not present

## 2020-01-15 DIAGNOSIS — I4729 Other ventricular tachycardia: Secondary | ICD-10-CM

## 2020-01-15 DIAGNOSIS — I43 Cardiomyopathy in diseases classified elsewhere: Secondary | ICD-10-CM | POA: Diagnosis not present

## 2020-01-15 NOTE — Patient Instructions (Addendum)
Medication Instructions:  Continue current medications  *If you need a refill on your cardiac medications before your next appointment, please call your pharmacy*  Lab Work: None Ordere  Testing/Procedures: None Ordered  Follow-Up: At [facility], you and your health needs are our priority.  As part of our continuing mission to provide you with exceptional heart care, we have created designated Provider Care Teams.  These Care Teams include your primary Cardiologist (physician) and Advanced Practice Providers (APPs -  Physician Assistants and Nurse Practitioners) who all work together to provide you with the care you need, when you need it.  Your next appointment:   4 month(s)  The format for your next appointment:   In Person  Provider:   You may see Minus Breeding, MD or one of the following Advanced Practice Providers on your designated Care Team:    Rosaria Ferries, PA-C  Jory Sims, DNP, ANP  Cadence Kathlen Mody, NP   Other Instructions Next Available with Dr [name]

## 2020-02-07 DIAGNOSIS — D649 Anemia, unspecified: Secondary | ICD-10-CM | POA: Diagnosis not present

## 2020-02-07 DIAGNOSIS — I2510 Atherosclerotic heart disease of native coronary artery without angina pectoris: Secondary | ICD-10-CM | POA: Diagnosis not present

## 2020-02-07 DIAGNOSIS — E039 Hypothyroidism, unspecified: Secondary | ICD-10-CM | POA: Diagnosis not present

## 2020-02-14 ENCOUNTER — Other Ambulatory Visit: Payer: Self-pay

## 2020-02-14 ENCOUNTER — Ambulatory Visit (INDEPENDENT_AMBULATORY_CARE_PROVIDER_SITE_OTHER): Payer: PPO | Admitting: Internal Medicine

## 2020-02-14 ENCOUNTER — Encounter: Payer: Self-pay | Admitting: Internal Medicine

## 2020-02-14 VITALS — BP 108/70 | HR 62 | Ht 73.0 in | Wt 208.0 lb

## 2020-02-14 DIAGNOSIS — I5022 Chronic systolic (congestive) heart failure: Secondary | ICD-10-CM

## 2020-02-14 DIAGNOSIS — I428 Other cardiomyopathies: Secondary | ICD-10-CM

## 2020-02-14 DIAGNOSIS — Z95810 Presence of automatic (implantable) cardiac defibrillator: Secondary | ICD-10-CM

## 2020-02-14 DIAGNOSIS — I480 Paroxysmal atrial fibrillation: Secondary | ICD-10-CM | POA: Diagnosis not present

## 2020-02-14 DIAGNOSIS — I472 Ventricular tachycardia, unspecified: Secondary | ICD-10-CM

## 2020-02-14 LAB — CUP PACEART INCLINIC DEVICE CHECK
Date Time Interrogation Session: 20210324170120
Implantable Lead Implant Date: 20130213
Implantable Lead Implant Date: 20130213
Implantable Lead Location: 753859
Implantable Lead Location: 753860
Implantable Pulse Generator Implant Date: 20130213
Pulse Gen Serial Number: 7003419

## 2020-02-14 NOTE — Patient Instructions (Signed)
Medication Instructions:  Your physician recommends that you continue on your current medications as directed. Please refer to the Current Medication list given to you today.  Labwork: None ordered.  Testing/Procedures: None ordered.  Follow-Up: Your physician wants you to follow-up in: 6 months with Dr Klein. You will receive a reminder letter in the mail two months in advance. If you don't receive a letter, please call our office to schedule the follow-up appointment.  Remote monitoring is used to monitor your Pacemaker of ICD from home. This monitoring reduces the number of office visits required to check your device to one time per year. It allows us to keep an eye on the functioning of your device to ensure it is working properly.Any Other Special Instructions Will Be Listed Below (If Applicable).  If you need a refill on your cardiac medications before your next appointment, please call your pharmacy.   

## 2020-02-14 NOTE — Progress Notes (Signed)
Patient Care Team: [name], MD as PCP - General (Internal Medicine) Minus Breeding, MD as PCP - Cardiology (Cardiology)   HPI  Corey Tyler is a 79 y.o. male Seen in followup for ICD implantation 2/13 for primary prevention. This occurred in the context of ischemic/nonischemic cardiomyopathy with underlying bradycardia.    December 2014 he had appropriate ICD discharge for polymorphic ventricular tachycardia. His Coreg was up titrated.   Subsequent Myoview scan was not gated; it demonstrated a large inferolateral and anterolateral scar without ischemia.  He also has a history of atrial fibrillation. In the past he was treated with Rivaroxaban which was complicated by GI bleeding. He was then treated with apixaban. He is also been treated with amiodarone.  12/17 he was admitted with sustained ventricular tachycardia below his detection rate. Amiodarone was uptitrated device was reprogrammed.    He was ablated  7/18 (GT)   11/18 Interval ventricular tachycardia prompted the recent introduction of ranolazine adjunctive to his amiodarone;  No further VT and he feels better   He had recurrent ventricular tachycardia again in the spring.  It is been quiesced and since his ranolazine was uptitrated from 500--1000 mg twice daily.  Hospitalized/20 with a small bowel obstruction and interval respiratory failure, and again in 2/21 having presented with weakness and dizziness and found to be orthostatic.  Multiple medications were held temporarily.  Subsequent to discharge, he has had problems with hypotension.  He discontinued his torsemide.  Metoprolol was further decreased.     DATE TEST EF   2008 echo  40-45%   1/14 echo    15 %   8/17 echo 25 %   10/18 Echo   45-50%   9/20 Echo  20.-25%       Date Cr K TSH LFTs Hgb PFTs  5/15    8.63 23    12/17    4.55 18    10/18   1.603 27    2/19   4.03 17 14.1   2/21 1.34 3.8 1.48 20(10/20) 12.8    Continues to struggle with  dyspnea.  Fatigue.  Desaturation.  According to his pulmonologist is lung issues are primarily cardiac.  Reviewing imaging he has emphysema documented by chest CT 2010.  No edema.  Unable to tolerate afterload because of hypotension.  Again remains on amiodarone despite the concerns about his lungs per their decision  Past Medical History:  Diagnosis Date  . A-fib ([place])    a. Noted on 12/2012 interrogation, placed on Apixaban and ultimately stopped NOACs 2/2 personal decision 8/14.  Marland Kitchen AICD (automatic cardioverter/defibrillator) present    Cardiac defibrillator -dual  [employer] Jud[employer]  . Arthritis   . Atrial tachycardia-non sustained    a. Noted on 03/2012 interrogation.  . Chronic systolic heart failure (HCC)    EF down to 20 to 25% per echo November 2012  . COPD (chronic obstructive pulmonary disease) ([place])   . Hyperlipidemia   . Hypertension   . ICD (implantable cardiac defibrillator) in place   . NICM (nonischemic cardiomyopathy) ([place])    a. Normal cors 2000. b. Minimal plaque 2013.  . Old myocardial infarction    a. ?Silent MI. b. Large fixed inferolateral defect c/w with prior infarct, no ischemia. c. Cath in 2000/2013 with only minimal CAD.  Marland Kitchen Presence of permanent cardiac pacemaker   . Pulmonary nodule, right    Last scan in 2010 showing stability; felt to be benign.  Marland Kitchen PVC's (  premature ventricular contractions)   . Ventricular tachycardia, polymorphic ([place])    Rx via ICD 12/14    Past Surgical History:  Procedure Laterality Date  . APPENDECTOMY    . CARDIAC CATHETERIZATION  2000  . CARDIAC DEFIBRILLATOR PLACEMENT    . CARDIOVERSION  03/30/2013   Procedure: CARDIOVERSION;  Surgeon: Thayer Headings, MD;  Location: [place];  Service: Cardiovascular;;  . COLON SURGERY    . COLONOSCOPY N/A 09/25/2013   Procedure: COLONOSCOPY;  Surgeon: Jerene Bears, MD;  Location: [facility];  Service: Endoscopy;  Laterality: N/A;  . ICD  12/2011   [name]  . IMPLANTABLE CARDIOVERTER  DEFIBRILLATOR IMPLANT N/A 01/06/2012   Procedure: IMPLANTABLE CARDIOVERTER DEFIBRILLATOR IMPLANT;  Surgeon: Deboraha Sprang, MD;  Location: [facility] CATH LAB;  Service: Cardiovascular;  Laterality: N/A;  . PACEMAKER IMPLANT     St [name]  . TEE WITHOUT CARDIOVERSION N/A 03/30/2013   Procedure: TRANSESOPHAGEAL ECHOCARDIOGRAM (TEE);  Surgeon: Thayer Headings, MD;  Location: [place];  Service: Cardiovascular;  Laterality: N/A;  . V TACH ABLATION  06/10/2017  . V TACH ABLATION N/A 06/10/2017   Procedure: V Tach Ablation;  Surgeon: Evans Lance, MD;  Location: [place] CV LAB;  Service: Cardiovascular;  Laterality: N/A;    Current Outpatient Medications  Medication Sig Dispense Refill  . acetaminophen (TYLENOL) 500 MG tablet Take 500 mg by mouth every 6 (six) hours as needed for headache (pain).    Marland Kitchen albuterol (PROVENTIL) (2.5 MG/3ML) 0.083% nebulizer solution Take 2.5 mg by nebulization every 6 (six) hours as needed for wheezing or shortness of breath.    Marland Kitchen albuterol (VENTOLIN HFA) 108 (90 Base) MCG/ACT inhaler Inhale 2 puffs into the lungs every 6 (six) hours as needed for wheezing or shortness of breath.    Marland Kitchen amiodarone (PACERONE) 200 MG tablet Take 200 mg by mouth daily.    . Camphor (VICKS VAPO STEAM IN) Inhale 1 application into the lungs at bedtime as needed (to open the lungs and for nasal congestion).     . Carboxymethylcellul-Glycerin (LUBRICATING EYE DROPS OP) Place 1 drop into both eyes daily as needed (dry eyes).     . cholecalciferol (VITAMIN D) 1000 UNITS tablet Take 1,000 Units by mouth daily.    Marland Kitchen ELIQUIS 5 MG TABS tablet TAKE 1 TABLET BY MOUTH TWICE A DAY (Patient taking differently: Take 5 mg by mouth 2 (two) times daily. ) 60 tablet 5  . ezetimibe (ZETIA) 10 MG tablet Take 10 mg by mouth daily.     . finasteride (PROSCAR) 5 MG tablet Take 5 mg by mouth daily.     . fluticasone (FLONASE) 50 MCG/ACT nasal spray Place 1 spray into both nostrils daily as needed for allergies.     .  Fluticasone-Salmeterol (ADVAIR DISKUS) 250-50 MCG/DOSE AEPB Inhale 1 puff into the lungs 2 (two) times daily as needed (shortness of breath/wheezing).     Marland Kitchen levothyroxine (SYNTHROID, LEVOTHROID) 125 MCG tablet Take 125 mcg by mouth daily before breakfast.     . loratadine (CLARITIN) 10 MG tablet Take 10 mg by mouth daily as needed for allergies.     Marland Kitchen MAGNESIUM PO Take 1 tablet by mouth at bedtime.    . Melatonin 3 MG TABS Take 3 mg by mouth at bedtime.    . metoprolol tartrate (LOPRESSOR) 25 MG tablet Take 12.5 mg by mouth daily.     . nitroGLYCERIN (NITROSTAT) 0.4 MG SL tablet Place 0.4 mg under the tongue every 5 (  five) minutes as needed for chest pain.     . Omega-3 Fatty Acids (FISH OIL) 500 MG CAPS Take 500 mg by mouth 2 (two) times daily.    . OXYGEN Inhale 3-6 L into the lungs See admin instructions. Inhale 3 L/min continuously at rest and 4.5-6 L/min with exertion    . Pediatric Multivit-Minerals-C (FLINTSTONES GUMMIES COMPLETE) CHEW Chew 1 tablet by mouth daily with breakfast.    . POTASSIUM PO Take 1 tablet by mouth at bedtime.    . promethazine (PHENERGAN) 25 MG tablet Take 25 mg by mouth daily as needed for nausea or vomiting.     . Psyllium (METAMUCIL PO) Take 1 Dose by mouth daily. Mix in liquid and drink     . ranolazine (RANEXA) 1000 MG SR tablet Take 1 tablet (1,000 mg total) by mouth 2 (two) times daily. 180 tablet 3  . Tamsulosin HCl (FLOMAX) 0.4 MG CAPS Take 0.4 mg by mouth at bedtime.     . Tiotropium Bromide Monohydrate (SPIRIVA RESPIMAT) 2.5 MCG/ACT AERS Inhale 2 puffs into the lungs every morning.    . torsemide (DEMADEX) 20 MG tablet Take 20 mg by mouth as needed.     No current facility-administered medications for this visit.    Allergies  Allergen Reactions  . Atorvastatin Shortness Of Breath and Cough  . Statins Shortness Of Breath and Other (See Comments)    Muscle and joing pain and "These bother my lungs and make my gait unsteady, also"  . Xarelto  [Rivaroxaban] Other (See Comments)    Bleeding  . Adhesive [Tape] Hives, Itching and Rash  . Codeine Nausea Only  . Isosorbide Nitrate Other (See Comments)    Made him feel bad  . Latex Itching and Other (See Comments)    Rash, itching, burning  . Levofloxacin Other (See Comments)    Tachycardia  . Lisinopril Cough  . Lorazepam Other (See Comments)    "felt bad"  . Mexiletine Other (See Comments)    GI distress  . Sertraline Other (See Comments)    Made him feel bad    Review of Systems negative except from HPI and PMH  Physical Exam BP 108/70   Pulse 62   Ht 6\' 1"  (1.854 m)   Wt 208 lb (94.3 kg)   SpO2 95%   BMI 27.44 kg/m  Well developed and well nourished in no acute distress HENT normal Neck supple with JVP-8 Clear Device pocket well healed; without hematoma or erythema.  There is no tethering  Regular rate and rhythm, no  gallop No  murmur Abd-soft with active BS No Clubbing cyanosis  edema Skin-warm and dry A & Oriented  Grossly normal sensory and motor function  ECG sinus at 62  intervals 19/12/46 Otherwise normal  Assessment and plan  Atrial fibrillation paroxysmal/persistent   High risk medication amiodarone  Chronotropic incompetence   Implantable defibrillator-St [name]  The patient's device was interrogated and the information was fully reviewed.  The device was reprogrammed as below  Ischemic/nonischemic cardiomyopathy interval improvement  Congestive heart failure-chronic-systolic  Ventricular tachycardia--recurrent   Hypotension    Presyncope  Hypotension has precluded up titration or use of guideline directed afterload.  Euvolemic today.  We will continue current medications.  No ischemia.  Recurrent ventricular tachycardia-treated with ATP.  Amiodarone has been continued not withstanding the possibility that may be contributing to his lung issues.  No significant interval atrial fibrillation.  On Anticoagulation;  No bleeding  issues

## 2020-02-19 DIAGNOSIS — K219 Gastro-esophageal reflux disease without esophagitis: Secondary | ICD-10-CM | POA: Diagnosis not present

## 2020-02-19 DIAGNOSIS — E039 Hypothyroidism, unspecified: Secondary | ICD-10-CM | POA: Diagnosis not present

## 2020-02-19 DIAGNOSIS — E559 Vitamin D deficiency, unspecified: Secondary | ICD-10-CM | POA: Diagnosis not present

## 2020-02-19 DIAGNOSIS — E119 Type 2 diabetes mellitus without complications: Secondary | ICD-10-CM | POA: Diagnosis not present

## 2020-02-19 DIAGNOSIS — J449 Chronic obstructive pulmonary disease, unspecified: Secondary | ICD-10-CM | POA: Diagnosis not present

## 2020-02-19 DIAGNOSIS — I472 Ventricular tachycardia: Secondary | ICD-10-CM | POA: Diagnosis not present

## 2020-02-19 DIAGNOSIS — I10 Essential (primary) hypertension: Secondary | ICD-10-CM | POA: Diagnosis not present

## 2020-02-19 DIAGNOSIS — I480 Paroxysmal atrial fibrillation: Secondary | ICD-10-CM | POA: Diagnosis not present

## 2020-02-19 DIAGNOSIS — K5901 Slow transit constipation: Secondary | ICD-10-CM | POA: Diagnosis not present

## 2020-02-19 DIAGNOSIS — I5022 Chronic systolic (congestive) heart failure: Secondary | ICD-10-CM | POA: Diagnosis not present

## 2020-02-19 DIAGNOSIS — E785 Hyperlipidemia, unspecified: Secondary | ICD-10-CM | POA: Diagnosis not present

## 2020-02-19 DIAGNOSIS — I252 Old myocardial infarction: Secondary | ICD-10-CM | POA: Diagnosis not present

## 2020-02-20 ENCOUNTER — Ambulatory Visit (INDEPENDENT_AMBULATORY_CARE_PROVIDER_SITE_OTHER): Payer: PPO | Admitting: *Deleted

## 2020-02-20 DIAGNOSIS — I428 Other cardiomyopathies: Secondary | ICD-10-CM

## 2020-02-20 LAB — CUP PACEART REMOTE DEVICE CHECK
Battery Remaining Longevity: 9 mo
Battery Remaining Percentage: 10 %
Battery Voltage: 2.68 V
Brady Statistic AP VP Percent: 1.5 %
Brady Statistic AP VS Percent: 64 %
Brady Statistic AS VP Percent: 1 %
Brady Statistic AS VS Percent: 32 %
Brady Statistic RA Percent Paced: 63 %
Brady Statistic RV Percent Paced: 1.5 %
Date Time Interrogation Session: 20210330020016
HighPow Impedance: 77 Ohm
HighPow Impedance: 77 Ohm
Implantable Lead Implant Date: 20130213
Implantable Lead Implant Date: 20130213
Implantable Lead Location: 753859
Implantable Lead Location: 753860
Implantable Pulse Generator Implant Date: 20130213
Lead Channel Impedance Value: 430 Ohm
Lead Channel Impedance Value: 450 Ohm
Lead Channel Pacing Threshold Amplitude: 0.5 V
Lead Channel Pacing Threshold Amplitude: 1 V
Lead Channel Pacing Threshold Pulse Width: 0.5 ms
Lead Channel Pacing Threshold Pulse Width: 0.6 ms
Lead Channel Sensing Intrinsic Amplitude: 12 mV
Lead Channel Sensing Intrinsic Amplitude: 2.1 mV
Lead Channel Setting Pacing Amplitude: 1.5 V
Lead Channel Setting Pacing Amplitude: 2.5 V
Lead Channel Setting Pacing Pulse Width: 0.6 ms
Lead Channel Setting Sensing Sensitivity: 0.5 mV
Pulse Gen Serial Number: 7003419

## 2020-02-20 NOTE — Progress Notes (Signed)
ICD remote 

## 2020-05-13 DIAGNOSIS — Z7189 Other specified counseling: Secondary | ICD-10-CM | POA: Insufficient documentation

## 2020-05-13 NOTE — Progress Notes (Signed)
Cardiology Office Note   Date:  05/14/2020   ID:  Corey, Tyler [birth year], MRN [id number]  PCP:  [name], MD  Cardiologist:   Minus Breeding, MD   Chief Complaint  Patient presents with  . Cough      History of Present Illness: Corey Tyler is a 79 y.o. male who presents for follow up of chronic systolic HF.  He has VT and St. [name] ICD. He has chronic atrial fib.  He was hospitalized in Feb with weakness and low BP.   He has had recurrent VT requiring amiodarone depite his history of lung disease.     He has been doing relatively well.  He has to move slowly because of drops in his blood pressure.  However, he has not had any frank syncope.  He has chronic shortness of breath.  He has cough and has some trouble coughing it up but he is not describing any new PND or orthopnea.  Is not having any new palpitations, presyncope or syncope.    Past Medical History:  Diagnosis Date  . A-fib ([place])    a. Noted on 12/2012 interrogation, placed on Apixaban and ultimately stopped NOACs 2/2 personal decision 8/14.  Marland Kitchen AICD (automatic cardioverter/defibrillator) present    Cardiac defibrillator -dual  St Judes  . Arthritis   . Atrial tachycardia-non sustained    a. Noted on 03/2012 interrogation.  . Chronic systolic heart failure (HCC)    EF down to 20 to 25% per echo November 2012  . COPD (chronic obstructive pulmonary disease) ([place])   . Hyperlipidemia   . Hypertension   . ICD (implantable cardiac defibrillator) in place   . NICM (nonischemic cardiomyopathy) ([place])    a. Normal cors 2000. b. Minimal plaque 2013.  . Old myocardial infarction    a. ?Silent MI. b. Large fixed inferolateral defect c/w with prior infarct, no ischemia. c. Cath in 2000/2013 with only minimal CAD.  Marland Kitchen Presence of permanent cardiac pacemaker   . Pulmonary nodule, right    Last scan in 2010 showing stability; felt to be benign.  Marland Kitchen PVC's (premature ventricular contractions)   . Ventricular  tachycardia, polymorphic ([place])    Rx via ICD 12/14    Past Surgical History:  Procedure Laterality Date  . APPENDECTOMY    . CARDIAC CATHETERIZATION  2000  . CARDIAC DEFIBRILLATOR PLACEMENT    . CARDIOVERSION  03/30/2013   Procedure: CARDIOVERSION;  Surgeon: Thayer Headings, MD;  Location: [place];  Service: Cardiovascular;;  . COLON SURGERY    . COLONOSCOPY N/A 09/25/2013   Procedure: COLONOSCOPY;  Surgeon: Jerene Bears, MD;  [facility]: [facility];  Service: Endoscopy;  Laterality: N/A;  . ICD  12/2011   [name]  . IMPLANTABLE CARDIOVERTER DEFIBRILLATOR IMPLANT N/A 01/06/2012   Procedure: IMPLANTABLE CARDIOVERTER DEFIBRILLATOR IMPLANT;  Surgeon: Deboraha Sprang, MD;  Location: [facility] CATH LAB;  Service: Cardiovascular;  Laterality: N/A;  . PACEMAKER IMPLANT     St [name]  . TEE WITHOUT CARDIOVERSION N/A 03/30/2013   Procedure: TRANSESOPHAGEAL ECHOCARDIOGRAM (TEE);  Surgeon: Thayer Headings, MD;  Location: [place];  Service: Cardiovascular;  Laterality: N/A;  . V TACH ABLATION  06/10/2017  . V TACH ABLATION N/A 06/10/2017   Procedure: V Tach Ablation;  Surgeon: Evans Lance, MD;  Location: [place] CV LAB;  Service: Cardiovascular;  Laterality: N/A;     Current Outpatient Medications  Medication Sig Dispense Refill  . acetaminophen (TYLENOL) 500 MG  tablet Take 500 mg by mouth every 6 (six) hours as needed for headache (pain).    Marland Kitchen albuterol (PROVENTIL) (2.5 MG/3ML) 0.083% nebulizer solution Take 2.5 mg by nebulization every 6 (six) hours as needed for wheezing or shortness of breath.    Marland Kitchen albuterol (VENTOLIN HFA) 108 (90 Base) MCG/ACT inhaler Inhale 2 puffs into the lungs every 6 (six) hours as needed for wheezing or shortness of breath.    Marland Kitchen amiodarone (PACERONE) 200 MG tablet Take 200 mg by mouth daily.    . Camphor (VICKS VAPO STEAM IN) Inhale 1 application into the lungs at bedtime as needed (to open the lungs and for nasal congestion).     . Carboxymethylcellul-Glycerin  (LUBRICATING EYE DROPS OP) Place 1 drop into both eyes daily as needed (dry eyes).     . cholecalciferol (VITAMIN D) 1000 UNITS tablet Take 1,000 Units by mouth daily.    Marland Kitchen ELIQUIS 5 MG TABS tablet TAKE 1 TABLET BY MOUTH TWICE A DAY (Patient taking differently: Take 5 mg by mouth 2 (two) times daily. ) 60 tablet 5  . ezetimibe (ZETIA) 10 MG tablet Take 10 mg by mouth daily.     . finasteride (PROSCAR) 5 MG tablet Take 5 mg by mouth daily.     . fluticasone (FLONASE) 50 MCG/ACT nasal spray Place 1 spray into both nostrils daily as needed for allergies.     . Fluticasone-Salmeterol (ADVAIR DISKUS) 250-50 MCG/DOSE AEPB Inhale 1 puff into the lungs 2 (two) times daily as needed (shortness of breath/wheezing).     Marland Kitchen levothyroxine (SYNTHROID, LEVOTHROID) 125 MCG tablet Take 125 mcg by mouth daily before breakfast.     . loratadine (CLARITIN) 10 MG tablet Take 10 mg by mouth daily as needed for allergies.     Marland Kitchen MAGNESIUM PO Take 1 tablet by mouth at bedtime.    . Melatonin 3 MG TABS Take 3 mg by mouth at bedtime.    . metoprolol succinate (TOPROL XL) 25 MG 24 hr tablet TAKE 1/2 TABLET DAILY 45 tablet 3  . metoprolol tartrate (LOPRESSOR) 25 MG tablet Take 12.5 mg by mouth daily.     . nitroGLYCERIN (NITROSTAT) 0.4 MG SL tablet Place 0.4 mg under the tongue every 5 (five) minutes as needed for chest pain.     . Omega-3 Fatty Acids (FISH OIL) 500 MG CAPS Take 500 mg by mouth 2 (two) times daily.    . OXYGEN Inhale 3-6 L into the lungs See admin instructions. Inhale 3 L/min continuously at rest and 4.5-6 L/min with exertion    . Pediatric Multivit-Minerals-C (FLINTSTONES GUMMIES COMPLETE) CHEW Chew 1 tablet by mouth daily with breakfast.    . POTASSIUM PO Take 1 tablet by mouth at bedtime.    . promethazine (PHENERGAN) 25 MG tablet Take 25 mg by mouth daily as needed for nausea or vomiting.     . Psyllium (METAMUCIL PO) Take 1 Dose by mouth daily. Mix in liquid and drink     . ranolazine (RANEXA) 1000 MG SR  tablet Take 1 tablet (1,000 mg total) by mouth 2 (two) times daily. 180 tablet 3  . Tamsulosin HCl (FLOMAX) 0.4 MG CAPS Take 0.4 mg by mouth at bedtime.     . Tiotropium Bromide Monohydrate (SPIRIVA RESPIMAT) 2.5 MCG/ACT AERS Inhale 2 puffs into the lungs every morning.    . torsemide (DEMADEX) 20 MG tablet Take 1 tablet by mouth daily and 1/2 tablet as needed 135 tablet 3   No current  facility-administered medications for this visit.    Allergies:   Atorvastatin, Statins, Xarelto [rivaroxaban], Adhesive [tape], Codeine, Isosorbide nitrate, Latex, Levofloxacin, Lisinopril, Lorazepam, Mexiletine, and Sertraline    ROS:  Please see the history of present illness.   Otherwise, review of systems are positive for none.   All other systems are reviewed and negative.    PHYSICAL EXAM: VS:  BP 124/78   Pulse 68   Wt 210 lb 9.6 oz (95.5 kg)   BMI 27.79 kg/m  , BMI Body mass index is 27.79 kg/m. GENERAL:  Well appearing NECK:  No jugular venous distention, waveform within normal limits, carotid upstroke brisk and symmetric, no bruits, no thyromegaly LUNGS:  Clear to auscultation bilaterally CHEST:  Well healed ICD scar HEART:  PMI not displaced or sustained,S1 and S2 within normal limits, no S3, no clicks, no rubs, no murmurs ABD:  Flat, positive bowel sounds normal in frequency in pitch, no bruits, no rebound, no guarding, no midline pulsatile mass, no hepatomegaly, no splenomegaly EXT:  2 plus pulses throughout, no edema, no cyanosis no clubbing   EKG:  EKG is not ordered today.    Recent Labs: 07/29/2019: B Natriuretic Peptide 66.6 09/13/2019: ALT 23 12/29/2019: Hemoglobin 12.8; Magnesium 2.0; Platelets 202; TSH 1.485 12/30/2019: BUN 19; Creatinine, Ser 1.34; Potassium 3.8; Sodium 137    Lipid Panel    Component Value Date/Time   CHOL 113 04/17/2014 0854   TRIG 58.0 04/17/2014 0854   HDL 49.80 04/17/2014 0854   CHOLHDL 2 04/17/2014 0854   VLDL 11.6 04/17/2014 0854   LDLCALC 52  04/17/2014 0854      Wt Readings from Last 3 Encounters:  05/14/20 210 lb 9.6 oz (95.5 kg)  02/14/20 208 lb (94.3 kg)  01/15/20 216 lb (98 kg)      Other studies Reviewed: Additional studies/ records that were reviewed today include: Labs. Review of the above records demonstrates:  Please see elsewhere in the note.     ASSESSMENT AND PLAN:  Chronic systolic HF:    He has not tolerated up titration of meds.  He has been hypotensive.  He does not tolerate med titration.  I am going to change to metoprolol succinate since he is only taking it once daily.  Otherwise meds will remain as listed.  He just had lab work done at his primary care office apparently today and I do not have these results.  Previously creatinine has been stable.  Atrial fibrillation:  He tolerates anticoagulation.  No change in therapy although I will check his creatinine when made available to make sure he is on the right dose as it would need to be reduced if his creatinine is elevated.   VT: He will continue amiodarone.  He is up-to-date with follow-up.  O2 dependent COPD:   He is O2 dependent.  He is followed by pulmonary.  We talked about good pulmonary hygiene.  Covid education: He has not wanted to get the vaccine and we will follow this.  Current medicines are reviewed at length with the patient today.  The patient does not have concerns regarding medicines.  The following changes have been made:  no change  Labs/ tests ordered today include:  No orders of the defined types were placed in this encounter.    Disposition:   FU with with me in 3 months.     Signed, Minus Breeding, MD  05/14/2020 2:17 PM    [facility]

## 2020-05-14 ENCOUNTER — Ambulatory Visit: Payer: PPO | Admitting: Cardiology

## 2020-05-14 ENCOUNTER — Encounter: Payer: Self-pay | Admitting: Cardiology

## 2020-05-14 ENCOUNTER — Other Ambulatory Visit: Payer: Self-pay

## 2020-05-14 VITALS — BP 124/78 | HR 68 | Wt 210.6 lb

## 2020-05-14 DIAGNOSIS — J449 Chronic obstructive pulmonary disease, unspecified: Secondary | ICD-10-CM | POA: Diagnosis not present

## 2020-05-14 DIAGNOSIS — I5022 Chronic systolic (congestive) heart failure: Secondary | ICD-10-CM | POA: Diagnosis not present

## 2020-05-14 DIAGNOSIS — Z7189 Other specified counseling: Secondary | ICD-10-CM | POA: Diagnosis not present

## 2020-05-14 DIAGNOSIS — E039 Hypothyroidism, unspecified: Secondary | ICD-10-CM | POA: Diagnosis not present

## 2020-05-14 DIAGNOSIS — I10 Essential (primary) hypertension: Secondary | ICD-10-CM | POA: Diagnosis not present

## 2020-05-14 DIAGNOSIS — I482 Chronic atrial fibrillation, unspecified: Secondary | ICD-10-CM

## 2020-05-14 DIAGNOSIS — E785 Hyperlipidemia, unspecified: Secondary | ICD-10-CM | POA: Diagnosis not present

## 2020-05-14 MED ORDER — TORSEMIDE 20 MG PO TABS: ORAL_TABLET | ORAL | 3 refills | Status: DC

## 2020-05-14 MED ORDER — METOPROLOL SUCCINATE ER 25 MG PO TB24: ORAL_TABLET | ORAL | 3 refills | Status: DC

## 2020-05-14 NOTE — Patient Instructions (Signed)
Medication Instructions:  STOP METOPROLOL TART   START METOPROLOL SUC 25 MG 1/2 TABLET DAILY   *If you need a refill on your cardiac medications before your next appointment, please call your pharmacy*  Lab Work: NONE  Testing/Procedures: NONE   Follow-Up: At [facility], you and your health needs are our priority.  As part of our continuing mission to provide you with exceptional heart care, we have created designated Provider Care Teams.  These Care Teams include your primary Cardiologist (physician) and Advanced Practice Providers (APPs -  Physician Assistants and Nurse Practitioners) who all work together to provide you with the care you need, when you need it.  We recommend signing up for the patient portal called "MyChart".  Sign up information is provided on this After Visit Summary.  MyChart is used to connect with patients for Virtual Visits (Telemedicine).  Patients are able to view lab/test results, encounter notes, upcoming appointments, etc.  Non-urgent messages can be sent to your provider as well.   To learn more about what you can do with MyChart, go to [email or web address].    Your next appointment:   3 month(s)  The format for your next appointment:   In Person  Provider:   DR [facility]

## 2020-05-21 ENCOUNTER — Ambulatory Visit (INDEPENDENT_AMBULATORY_CARE_PROVIDER_SITE_OTHER): Payer: PPO | Admitting: *Deleted

## 2020-05-21 DIAGNOSIS — I472 Ventricular tachycardia, unspecified: Secondary | ICD-10-CM

## 2020-05-21 DIAGNOSIS — I429 Cardiomyopathy, unspecified: Secondary | ICD-10-CM | POA: Diagnosis not present

## 2020-05-21 DIAGNOSIS — K219 Gastro-esophageal reflux disease without esophagitis: Secondary | ICD-10-CM | POA: Diagnosis not present

## 2020-05-21 DIAGNOSIS — Z0000 Encounter for general adult medical examination without abnormal findings: Secondary | ICD-10-CM | POA: Diagnosis not present

## 2020-05-21 DIAGNOSIS — E039 Hypothyroidism, unspecified: Secondary | ICD-10-CM | POA: Diagnosis not present

## 2020-05-21 DIAGNOSIS — I252 Old myocardial infarction: Secondary | ICD-10-CM | POA: Diagnosis not present

## 2020-05-21 DIAGNOSIS — I5022 Chronic systolic (congestive) heart failure: Secondary | ICD-10-CM

## 2020-05-21 DIAGNOSIS — E785 Hyperlipidemia, unspecified: Secondary | ICD-10-CM | POA: Diagnosis not present

## 2020-05-21 DIAGNOSIS — J449 Chronic obstructive pulmonary disease, unspecified: Secondary | ICD-10-CM | POA: Diagnosis not present

## 2020-05-21 DIAGNOSIS — I480 Paroxysmal atrial fibrillation: Secondary | ICD-10-CM | POA: Diagnosis not present

## 2020-05-21 DIAGNOSIS — E119 Type 2 diabetes mellitus without complications: Secondary | ICD-10-CM | POA: Diagnosis not present

## 2020-05-21 DIAGNOSIS — I10 Essential (primary) hypertension: Secondary | ICD-10-CM | POA: Diagnosis not present

## 2020-05-21 LAB — CUP PACEART REMOTE DEVICE CHECK
Battery Remaining Longevity: 7 mo
Battery Remaining Percentage: 8 %
Battery Voltage: 2.66 V
Brady Statistic AP VP Percent: 1.2 %
Brady Statistic AP VS Percent: 68 %
Brady Statistic AS VP Percent: 1 %
Brady Statistic AS VS Percent: 30 %
Brady Statistic RA Percent Paced: 67 %
Brady Statistic RV Percent Paced: 1.3 %
Date Time Interrogation Session: 20210629020015
HighPow Impedance: 82 Ohm
HighPow Impedance: 82 Ohm
Implantable Lead Implant Date: 20130213
Implantable Lead Implant Date: 20130213
Implantable Lead Location: 753859
Implantable Lead Location: 753860
Implantable Pulse Generator Implant Date: 20130213
Lead Channel Impedance Value: 410 Ohm
Lead Channel Impedance Value: 440 Ohm
Lead Channel Pacing Threshold Amplitude: 0.5 V
Lead Channel Pacing Threshold Amplitude: 1 V
Lead Channel Pacing Threshold Pulse Width: 0.5 ms
Lead Channel Pacing Threshold Pulse Width: 0.6 ms
Lead Channel Sensing Intrinsic Amplitude: 12 mV
Lead Channel Sensing Intrinsic Amplitude: 2.5 mV
Lead Channel Setting Pacing Amplitude: 1.5 V
Lead Channel Setting Pacing Amplitude: 2.5 V
Lead Channel Setting Pacing Pulse Width: 0.6 ms
Lead Channel Setting Sensing Sensitivity: 0.5 mV
Pulse Gen Serial Number: 7003419

## 2020-05-23 NOTE — Progress Notes (Signed)
Remote ICD transmission.   

## 2020-06-01 IMAGING — DX DG ABDOMEN 1V
1 series · 1 of 1 positions shown · non-contrast
Comparison: 08/31/2019 earlier.

CLINICAL DATA: Nasogastric tube placement.

EXAM:
ABDOMEN - 1 VIEW

[abdomen kub]
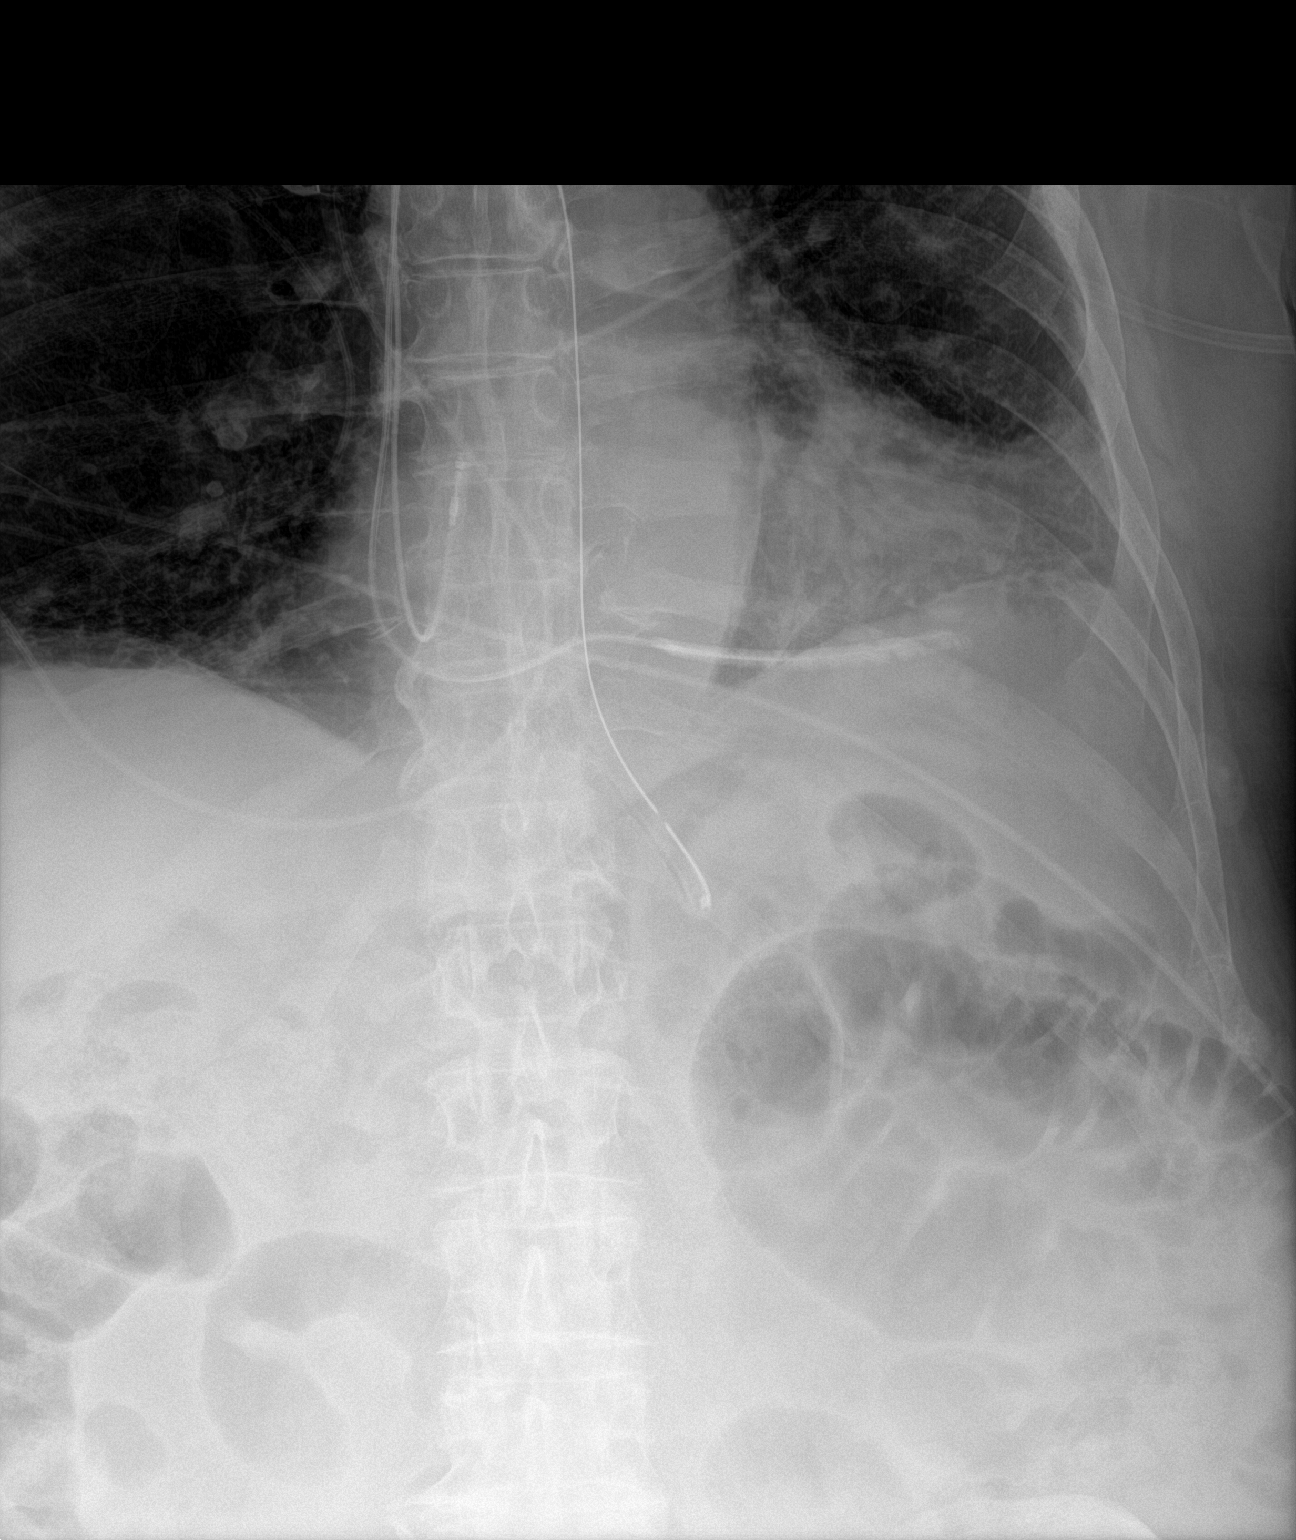

[1 of 1 positions shown; findings below may reference images not displayed]

FINDINGS: Patient's nasogastric tube has been straightened out and has tip
over the region of the gastric cardia in the left upper quadrant
with side-port just above the expected region of the
gastroesophageal junction. This could be advanced at least 5 cm.
Persistent air-filled dilated small bowel loop over the left upper
quadrant. No free peritoneal air. Remainder the exam is unchanged.
IMPRESSION: Persistent air-filled dilated small bowel loop over the left upper
quadrant.

Nasogastric tube is been straight diet with tip projecting over the
gastric cardia in the left upper quadrant and side-port just above
the expected region of the gastroesophageal junction. This could be
advanced another 5 cm.

## 2020-06-05 IMAGING — DX DG ABD PORTABLE 1V
2 series · 2 of 2 positions shown · non-contrast
Comparison: Two days ago

CLINICAL DATA: Nasogastric tube placement

EXAM:
PORTABLE ABDOMEN - 1 VIEW

[abdomen kub (1 of 2)]
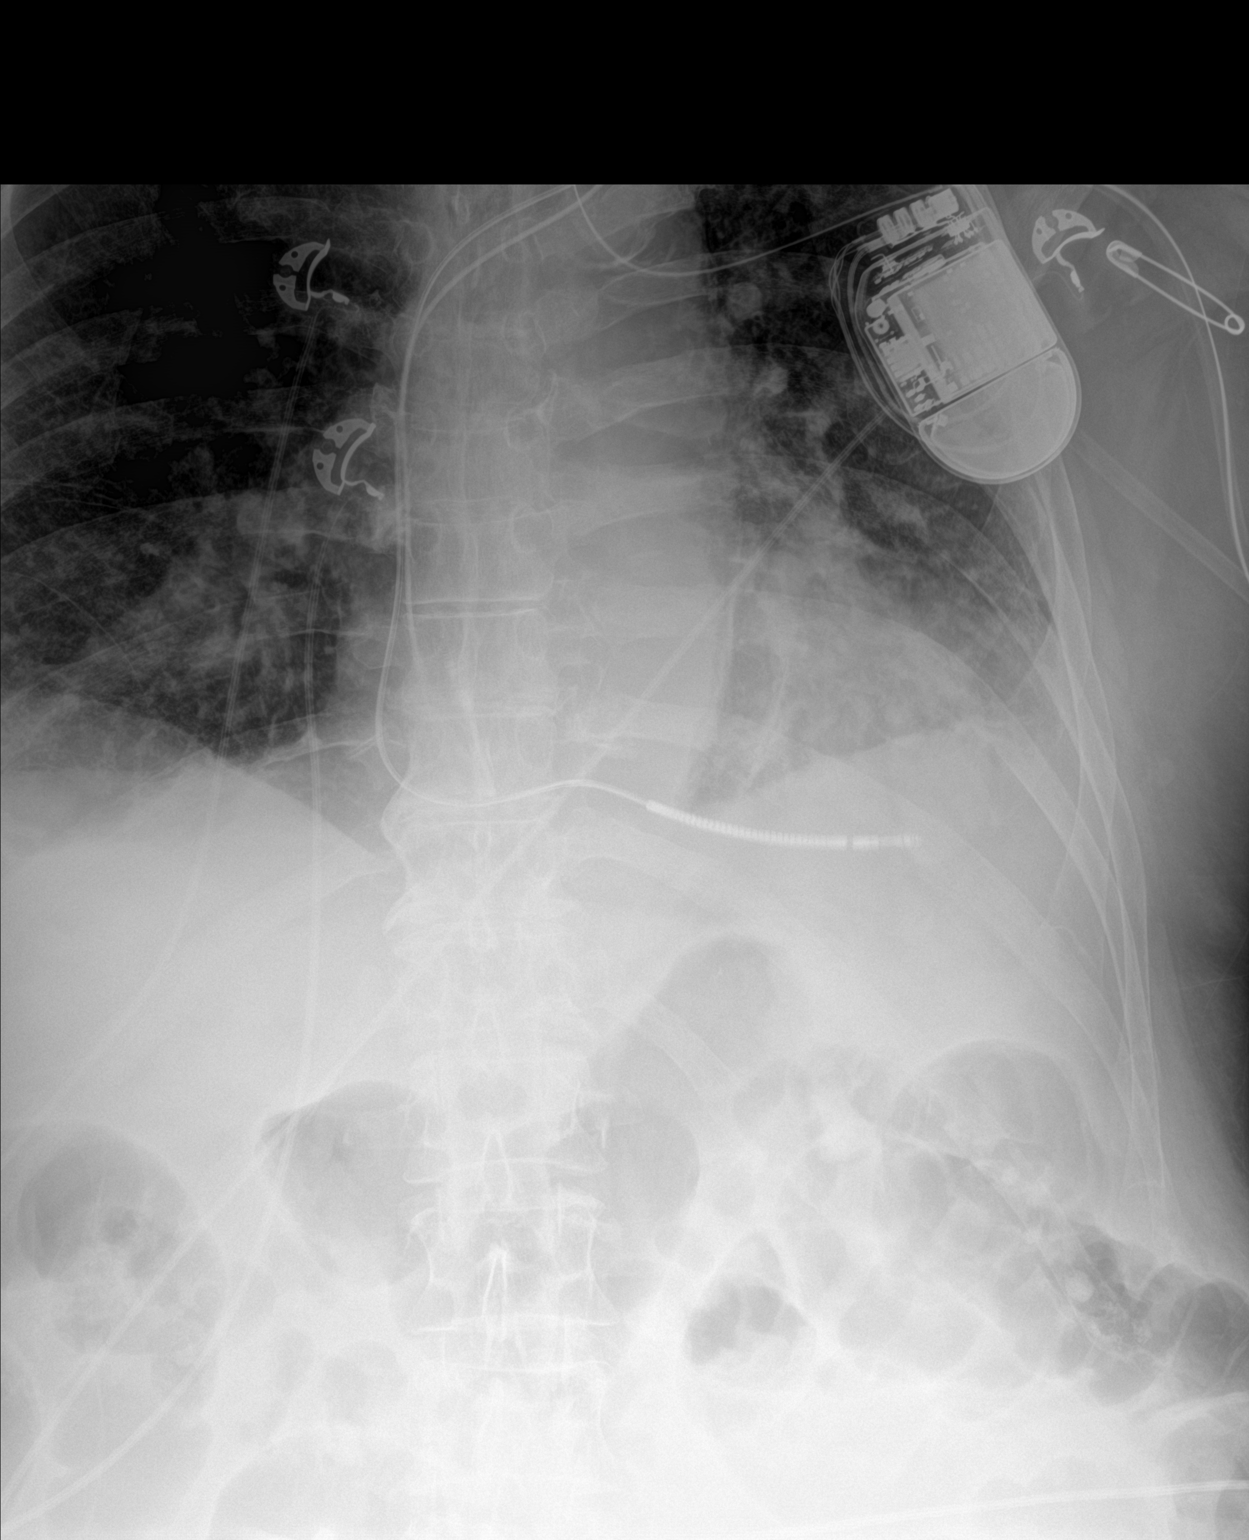

[abdomen kub (2 of 2)]
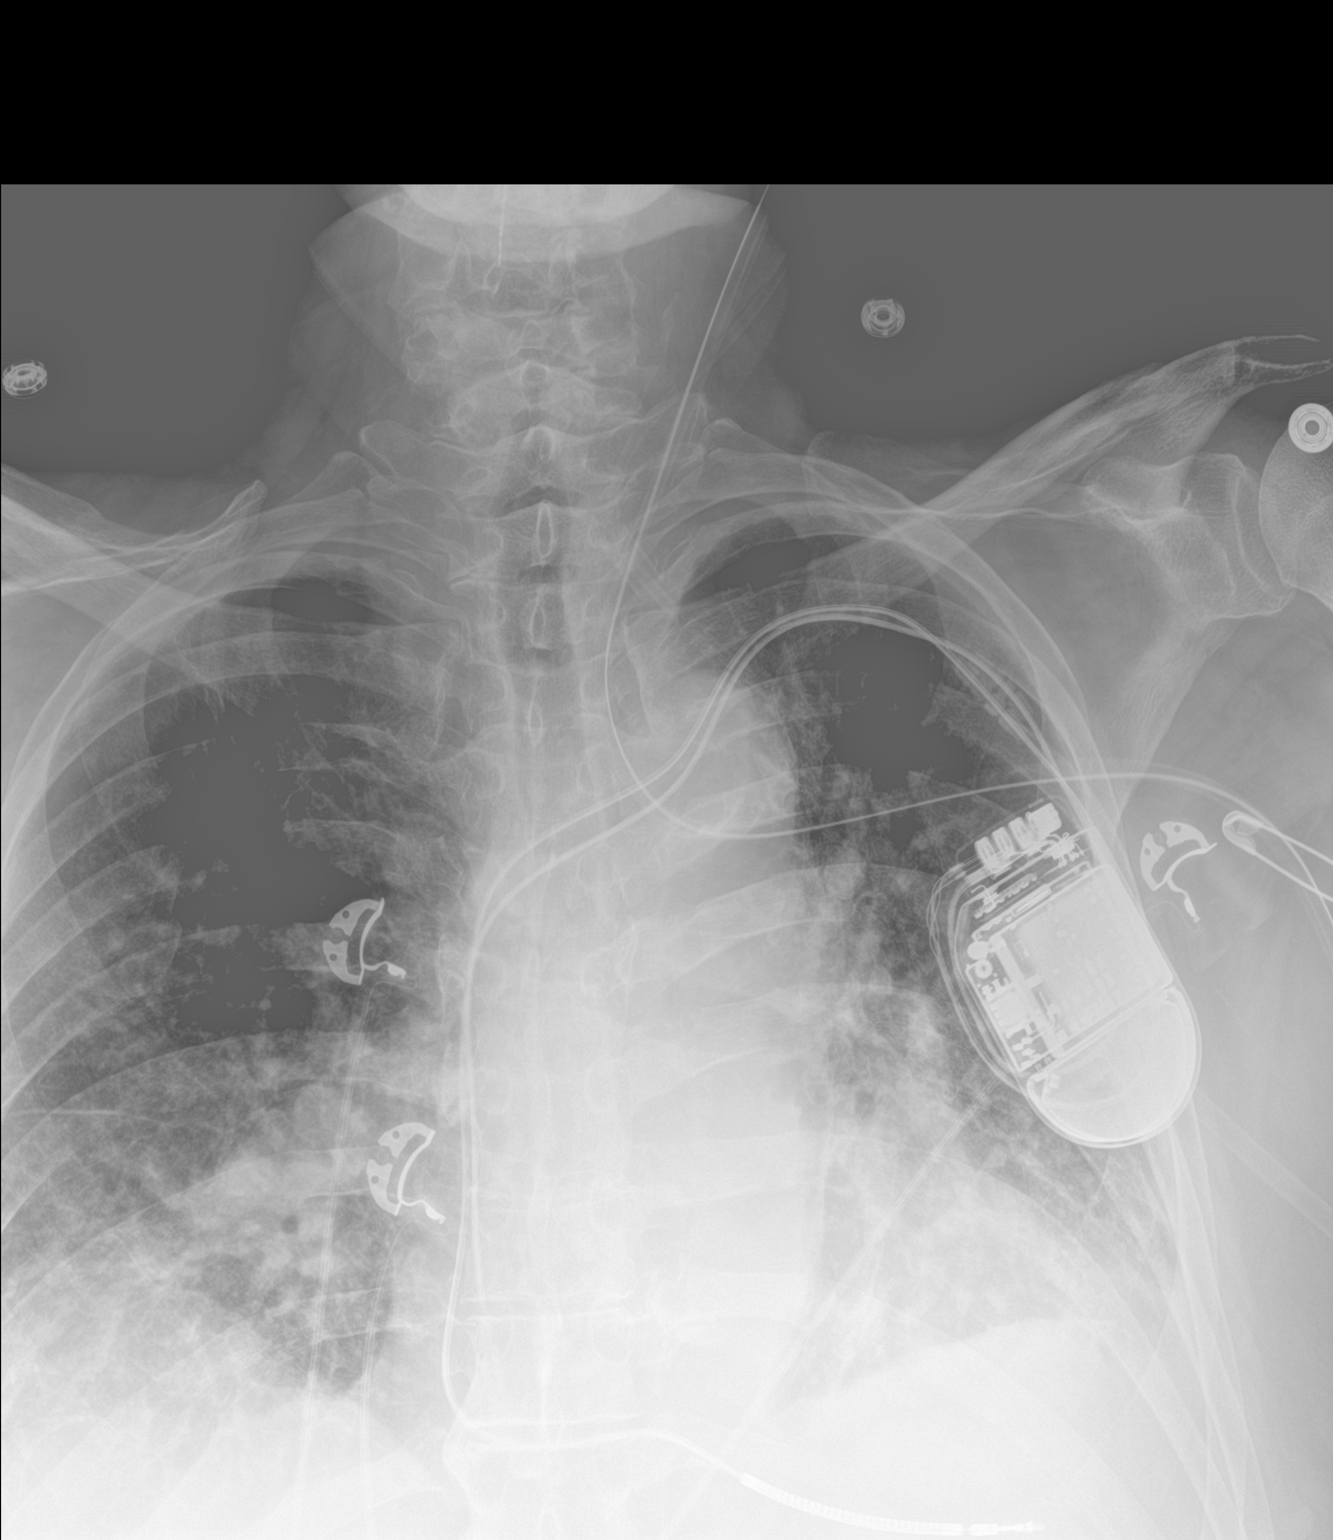

[2 of 2 positions shown; findings below may reference images not displayed]

FINDINGS: The nasogastric tube tip terminates at the level of the throat,
above the glottis.

Indistinct bilateral pulmonary opacity that is similar to prior.
Chronic cardiomegaly. There is dual-chamber pacer leads from the
left.

These results will be called to the ordering clinician or
representative by the Radiologist Assistant, and communication
documented in the PACS or zVision Dashboard.
IMPRESSION: The enteric tube tip terminates in the supraglottic neck.

## 2020-08-09 NOTE — Progress Notes (Addendum)
Cardiology Office Note Date:  08/09/2020  Patient ID:  Corey Tyler, Corey Tyler Jan 03, 1941, MRN [id number] PCP:  [name], MD  Cardiologist:  Dr. Percival Spanish Electrophysiologist: Dr. [name] Pulmonologist: Dr. Gwenette Greet ([place])    Chief Complaint:  Planned EO follow up  History of Present Illness: Corey Tyler is a 79 y.o. male with history of Paroxysmal AFib, NICM w/ICD, chronic CHF (systolic), VT (polymorphic in 2014), COPD is O2 dependent  He was admitted to [facility] 11/02/16 with c/o weakness noted in monomorphic VT below his detection rate, his device reprogrammed and his home amiodarone re-bolused, discharged 11/04/16 on 400mg  BID with plans to schedule EST to make sure his sinus rate does not get above his detection rate.    He comes in today to be seen for Dr. [name] who last saw him in March 2021.  Discussed recent need for discontinueation and reduction of a number of his medicines 2/2 orthostatic dizziness He had gotten ATP for VT (Feb 2021) Amiodarone continued despite pulm hx  More recently he saw Dr. Percival Spanish 05/14/20 Know hypotension, mentions moves slowly 2/2 this, no reports of syncope.  He was taking lopresor once daily and was changed to metoprolol succ. Planned to update his BMET   TODAY He is with his wife today, he comes with a walker on O2. He denies any CP, palpitations, no dizzy spells, or syncope, no shocks He has baseline SOB/DOE, this is unchanged y his account, no unusual SOB or DOE, no symptoms of orthopnea or PND He has known hx of orthostatic dizziness that is bothersome to him, particularly when he needs to urinate with some urgency. He has not fainted or fallen. Both he and his wife are very knowledgeable on the difficult balance of volume management to avoid volume OL and dehydration. He thinks he is still taking metoprolol tartrate, his wife seems surprised, they aren't sure, but is taking a metoprolol 1/2 tab daily. He reports his weight at hom by his scale 200,  and his weights range from 200-205lbs He does not feel like he is retaining right now. He denies any bleeding or signs of bleeding, has bruises on his arms, none otherwise He sees Dr. Percival Spanish next week as well as his PMD who will be doing his annual labs.   Device information: SJM dual chamber ICD, implanted 01/06/12, Dr. [name], primary prevention\ The patient is aware of device alert, industry representative d/w the patient again today he was aware and has been already demonstrated the vibratory alert  VT hx + hx ploymorphic VT with therapy 2014 Monomorphic VT below detection rate Dec 2017 VT ablation 06/10/2017 (Dr. Lovena Le) Nov 2018 VT ATP Feb 2021    AAD hx 2017 Amiodarone is current 2018 Ranexa > uptitrated is current Record reports intolerant of Mexiletine (feeling of off balance)  AFib Hx GI bleed on Xarelto    Past Medical History:  Diagnosis Date  . A-fib ([place])    a. Noted on 12/2012 interrogation, placed on Apixaban and ultimately stopped NOACs 2/2 personal decision 8/14.  Marland Kitchen AICD (automatic cardioverter/defibrillator) present    Cardiac defibrillator -dual  [employer]  . Arthritis   . Atrial tachycardia-non sustained    a. Noted on 03/2012 interrogation.  . Chronic systolic heart failure (HCC)    EF down to 20 to 25% per echo November 2012  . COPD (chronic obstructive pulmonary disease) ([place])   . Hyperlipidemia   . Hypertension   . ICD (implantable cardiac defibrillator) in place   .  NICM (nonischemic cardiomyopathy) ([place])    a. Normal cors 2000. b. Minimal plaque 2013.  . Old myocardial infarction    a. ?Silent MI. b. Large fixed inferolateral defect c/w with prior infarct, no ischemia. c. Cath in 2000/2013 with only minimal CAD.  Marland Kitchen Presence of permanent cardiac pacemaker   . Pulmonary nodule, right    Last scan in 2010 showing stability; felt to be benign.  Marland Kitchen PVC's (premature ventricular contractions)   . Ventricular tachycardia, polymorphic ([place])    Rx via  ICD 12/14    Past Surgical History:  Procedure Laterality Date  . APPENDECTOMY    . CARDIAC CATHETERIZATION  2000  . CARDIAC DEFIBRILLATOR PLACEMENT    . CARDIOVERSION  03/30/2013   Procedure: CARDIOVERSION;  Surgeon: Thayer Headings, MD;  Location: [facility];  Service: Cardiovascular;;  . COLON SURGERY    . COLONOSCOPY N/A 09/25/2013   Procedure: COLONOSCOPY;  Surgeon: Jerene Bears, MD;  Location: [facility]OSCOPY;  Service: Endoscopy;  Laterality: N/A;  . ICD  12/2011   [name]  . IMPLANTABLE CARDIOVERTER DEFIBRILLATOR IMPLANT N/A 01/06/2012   Procedure: IMPLANTABLE CARDIOVERTER DEFIBRILLATOR IMPLANT;  Surgeon: Deboraha Sprang, MD;  Location: [facility] CATH LAB;  Service: Cardiovascular;  Laterality: N/A;  . PACEMAKER IMPLANT     St [name]  . TEE WITHOUT CARDIOVERSION N/A 03/30/2013   Procedure: TRANSESOPHAGEAL ECHOCARDIOGRAM (TEE);  Surgeon: Thayer Headings, MD;  Location: [facility];  Service: Cardiovascular;  Laterality: N/A;  . V TACH ABLATION  06/10/2017  . V TACH ABLATION N/A 06/10/2017   Procedure: V Tach Ablation;  Surgeon: Evans Lance, MD;  Location: [facility] CV LAB;  Service: Cardiovascular;  Laterality: N/A;    Current Outpatient Medications  Medication Sig Dispense Refill  . acetaminophen (TYLENOL) 500 MG tablet Take 500 mg by mouth every 6 (six) hours as needed for headache (pain).    Marland Kitchen albuterol (PROVENTIL) (2.5 MG/3ML) 0.083% nebulizer solution Take 2.5 mg by nebulization every 6 (six) hours as needed for wheezing or shortness of breath.    Marland Kitchen albuterol (VENTOLIN HFA) 108 (90 Base) MCG/ACT inhaler Inhale 2 puffs into the lungs every 6 (six) hours as needed for wheezing or shortness of breath.    Marland Kitchen amiodarone (PACERONE) 200 MG tablet Take 200 mg by mouth daily.    . Camphor (VICKS VAPO STEAM IN) Inhale 1 application into the lungs at bedtime as needed (to open the lungs and for nasal congestion).     . Carboxymethylcellul-Glycerin (LUBRICATING EYE DROPS OP) Place 1 drop into both  eyes daily as needed (dry eyes).     . cholecalciferol (VITAMIN D) 1000 UNITS tablet Take 1,000 Units by mouth daily.    Marland Kitchen ELIQUIS 5 MG TABS tablet TAKE 1 TABLET BY MOUTH TWICE A DAY (Patient taking differently: Take 5 mg by mouth 2 (two) times daily. ) 60 tablet 5  . ezetimibe (ZETIA) 10 MG tablet Take 10 mg by mouth daily.     . finasteride (PROSCAR) 5 MG tablet Take 5 mg by mouth daily.     . fluticasone (FLONASE) 50 MCG/ACT nasal spray Place 1 spray into both nostrils daily as needed for allergies.     . Fluticasone-Salmeterol (ADVAIR DISKUS) 250-50 MCG/DOSE AEPB Inhale 1 puff into the lungs 2 (two) times daily as needed (shortness of breath/wheezing).     Marland Kitchen levothyroxine (SYNTHROID, LEVOTHROID) 125 MCG tablet Take 125 mcg by mouth daily before breakfast.     . loratadine (CLARITIN) 10 MG tablet  Take 10 mg by mouth daily as needed for allergies.     Marland Kitchen MAGNESIUM PO Take 1 tablet by mouth at bedtime.    . Melatonin 3 MG TABS Take 3 mg by mouth at bedtime.    . metoprolol succinate (TOPROL XL) 25 MG 24 hr tablet TAKE 1/2 TABLET DAILY 45 tablet 3  . nitroGLYCERIN (NITROSTAT) 0.4 MG SL tablet Place 0.4 mg under the tongue every 5 (five) minutes as needed for chest pain.     . Omega-3 Fatty Acids (FISH OIL) 500 MG CAPS Take 500 mg by mouth 2 (two) times daily.    . OXYGEN Inhale 3-6 L into the lungs See admin instructions. Inhale 3 L/min continuously at rest and 4.5-6 L/min with exertion    . Pediatric Multivit-Minerals-C (FLINTSTONES GUMMIES COMPLETE) CHEW Chew 1 tablet by mouth daily with breakfast.    . POTASSIUM PO Take 1 tablet by mouth at bedtime.    . promethazine (PHENERGAN) 25 MG tablet Take 25 mg by mouth daily as needed for nausea or vomiting.     . Psyllium (METAMUCIL PO) Take 1 Dose by mouth daily. Mix in liquid and drink     . ranolazine (RANEXA) 1000 MG SR tablet Take 1 tablet (1,000 mg total) by mouth 2 (two) times daily. 180 tablet 3  . Tamsulosin HCl (FLOMAX) 0.4 MG CAPS Take 0.4  mg by mouth at bedtime.     . Tiotropium Bromide Monohydrate (SPIRIVA RESPIMAT) 2.5 MCG/ACT AERS Inhale 2 puffs into the lungs every morning.    . torsemide (DEMADEX) 20 MG tablet Take 1 tablet by mouth daily and 1/2 tablet as needed 135 tablet 3   No current facility-administered medications for this visit.    Allergies:   Atorvastatin, Statins, Xarelto [rivaroxaban], Adhesive [tape], Codeine, Isosorbide nitrate, Latex, Levofloxacin, Lisinopril, Lorazepam, Mexiletine, and Sertraline   Social History:  The patient  reports that he quit smoking about 36 years ago. His smoking use included cigarettes. He has a 100.00 pack-year smoking history. He has never used smokeless tobacco. He reports that he does not drink alcohol and does not use drugs.   Family History:  The patient's family history includes Emphysema in his mother; Esophageal cancer in his brother; Heart disease in his mother; Heart failure in his father.  ROS:  Please see the history of present illness.  All other systems are reviewed and otherwise negative.   PHYSICAL EXAM:  VS:  There were no vitals taken for this visit. BMI: There is no height or weight on file to calculate BMI. Well nourished, well developed, in no acute distress  HEENT: normocephalic, atraumatic  Neck: no JVD, carotid bruits or masses Cardiac: RRR; no significant murmurs, no rubs, or gallops Lungs:  Somewhat diminished throughout, no wheezing, rhonchi or rales  Abd: soft, nontender MS: no deformity  Ext: trace ankle edema  Skin: warm and dry, no rash Neuro:  No gross deficits appreciated Psych: euthymic mood, full affect  ICD site is stable, no tethering or discomfort   EKG:  Not done today  ICD interrogation done today and reviewed by myself:  Battery estimate is 5.41mo Lead measurements are good No VT AMS burden <1%, longest 53min 40seconds   08/18/2019: TTE IMPRESSIONS  1. Left ventricular ejection fraction, by visual estimation, is 25 to   30%. The left ventricle has severely decreased function. Mildly increased  left ventricular size. There is mildly increased left ventricular  hypertrophy.  2. Left ventricular diastolic Doppler parameters are  consistent with  impaired relaxation pattern of LV diastolic filling.  3. Global right ventricle has normal systolic function.The right  ventricular size is mildly enlarged.  4. Left atrial size was mildly dilated.  5. Right atrial size was normal.  6. The mitral valve is normal in structure. Mild mitral valve  regurgitation. No evidence of mitral stenosis.  7. The tricuspid valve is normal in structure. Tricuspid valve  regurgitation is mild.  8. The aortic valve is tricuspid Aortic valve regurgitation is mild by  color flow Doppler. Mild aortic valve sclerosis without stenosis.  9. The pulmonic valve was grossly normal. Pulmonic valve regurgitation is  trivial by color flow Doppler.  10. Aortic dilatation noted.  11. There is mild dilatation of the aortic root measuring 39 mm.  12. Mildly elevated pulmonary artery systolic pressure.  13. A pacer wire is visualized.  14. The inferior vena cava is normal in size with greater than 50%  respiratory variability, suggesting right atrial pressure of 3 mmHg.  15. Severe LV systolic dysfunction; grade 1 diastolic dysfunction; mild  LVH and LVE; mild AI, MR and TR; mild LAE and RVE; mildly elevated  pulmonary pressure.     06/10/2017: EPS/ablation Conclusion: Successful EP study and catheter ablation of the patient's clinical ventricular tachycardia utilizing 3-dimensional electro anatomic mapping. There is a paucity of scar tissue on the endocardium of the left ventricle. A second VT was induced which was hemodynamically unstable and could not be mapped. There is no clearcut scar tissue associated with the endocardial surface of the left ventricle from which this VT originated.    07/22/16: TTE Study Conclusions - Left  ventricle: The cavity size was mildly dilated. There was   mild focal basal hypertrophy of the septum. Systolic function was   severely reduced. The estimated ejection fraction was in the   range of 25% to 30%. Diffuse hypokinesis. Hypokinesis is worse in   the inferolateral and anteriolateral myocardium. Doppler   parameters are consistent with abnormal left ventricular   relaxation (grade 1 diastolic dysfunction). Doppler parameters   are consistent with indeterminate ventricular filling pressure. - Aortic valve: Transvalvular velocity was within the normal range.   There was no stenosis. There was mild regurgitation. - Mitral valve: Transvalvular velocity was within the normal range.   There was no evidence for stenosis. There was mild regurgitation. - Left atrium: The atrium was moderately dilated. 106mm - Right ventricle: The cavity size was mildly dilated. Wall   thickness was normal. Systolic function was mildly reduced. - Tricuspid valve: There was mild regurgitation. - Pulmonic valve: Transvalvular velocity was within the normal   range. There was no evidence for stenosis. There was mild   regurgitation. - Pulmonary arteries: Systolic pressure was mildly increased. PA   peak pressure: 41 mm Hg (S). - Pericardium, extracardiac: A small pericardial effusion was   identified.  Recent Labs: 09/13/2019: ALT 23 12/29/2019: Hemoglobin 12.8; Magnesium 2.0; Platelets 202; TSH 1.485 12/30/2019: BUN 19; Creatinine, Ser 1.34; Potassium 3.8; Sodium 137  No results found for requested labs within last 8760 hours.   CrCl cannot be calculated (Patient's most recent lab result is older than the maximum 21 days allowed.).   Wt Readings from Last 3 Encounters:  05/14/20 210 lb 9.6 oz (95.5 kg)  02/14/20 208 lb (94.3 kg)  01/15/20 216 lb (98 kg)     Other studies reviewed: Additional studies/records reviewed today include: summarized above  ASSESSMENT AND PLAN:  1. VT  on Amiodarone  and ranexa     No VT noted on today's device check     He is getting annual labs done next week with his PMD     Reports his breathing at his baseline  2. ICD     Battery is nearin ERI     Intact function, no programming changes made.     The patient's wife would like to have him seen in Dec with Dr. [name] to check his battery and discuss battery change with him.      3. AFib, Persistent     burden <1%     CHA2DS2Vasc is 5, on Eliquis,  appropriately dosed  4. Ischemic/nonischemic CM 5. Chronic CHF (systolic)     BP has limited GDMT     They will check his medicine bottles when home and confirm his metoprolol with Dr. Percival Spanish next week     Cor Vue looks OK, he reports stable weights at home     No exam findings to suggest volume OL currently     <1% VP  6. HTN     Now with orthostatic hypotension     Discussed symptom recognition, safety   Disposition: Dr. [name] mid December, sooner if needed     Current medicines are reviewed at length with the patient today.  The patient did not have any concerns regarding medicines.  Haywood Lasso, PA-C 08/09/2020 9:06 AM     [facility] [address] [place] [address] [phone] (office)  [phone] (fax)

## 2020-08-12 ENCOUNTER — Other Ambulatory Visit: Payer: Self-pay

## 2020-08-12 ENCOUNTER — Ambulatory Visit (INDEPENDENT_AMBULATORY_CARE_PROVIDER_SITE_OTHER): Payer: PPO | Admitting: Physician Assistant

## 2020-08-12 VITALS — BP 110/52 | HR 67 | Ht 72.0 in | Wt 211.0 lb

## 2020-08-12 DIAGNOSIS — I10 Essential (primary) hypertension: Secondary | ICD-10-CM | POA: Diagnosis not present

## 2020-08-12 DIAGNOSIS — I472 Ventricular tachycardia, unspecified: Secondary | ICD-10-CM

## 2020-08-12 DIAGNOSIS — I480 Paroxysmal atrial fibrillation: Secondary | ICD-10-CM | POA: Diagnosis not present

## 2020-08-12 DIAGNOSIS — I255 Ischemic cardiomyopathy: Secondary | ICD-10-CM | POA: Diagnosis not present

## 2020-08-12 DIAGNOSIS — I428 Other cardiomyopathies: Secondary | ICD-10-CM | POA: Diagnosis not present

## 2020-08-12 DIAGNOSIS — I951 Orthostatic hypotension: Secondary | ICD-10-CM | POA: Diagnosis not present

## 2020-08-12 DIAGNOSIS — Z95810 Presence of automatic (implantable) cardiac defibrillator: Secondary | ICD-10-CM

## 2020-08-12 DIAGNOSIS — I5022 Chronic systolic (congestive) heart failure: Secondary | ICD-10-CM

## 2020-08-12 NOTE — Patient Instructions (Signed)
Medication Instructions:   Your physician recommends that you continue on your current medications as directed. Please refer to the Current Medication list given to you today.  *If you need a refill on your cardiac medications before your next appointment, please call your pharmacy*   Lab Work: [place]   If you have labs (blood work) drawn today and your tests are completely normal, you will receive your results only by: Marland Kitchen MyChart Message (if you have MyChart) OR . A paper copy in the mail If you have any lab test that is abnormal or we need to change your treatment, we will call you to review the results.   Testing/Procedures: NONE ORDERED  TODAY   Follow-Up: At [facility], you and your health needs are our priority.  As part of our continuing mission to provide you with exceptional heart care, we have created designated Provider Care Teams.  These Care Teams include your primary Cardiologist (physician) and Advanced Practice Providers (APPs -  Physician Assistants and Nurse Practitioners) who all work together to provide you with the care you need, when you need it.  We recommend signing up for the patient portal called "MyChart".  Sign up information is provided on this After Visit Summary.  MyChart is used to connect with patients for Virtual Visits (Telemedicine).  Patients are able to view lab/test results, encounter notes, upcoming appointments, etc.  Non-urgent messages can be sent to your provider as well.   To learn more about what you can do with MyChart, go to [email or web address].    Your next appointment:   3 month(s)  The format for your next appointment:   In Person  Provider:   You may Dr. [name] ONLY   Other Instructions

## 2020-08-13 ENCOUNTER — Other Ambulatory Visit: Payer: Self-pay

## 2020-08-13 MED ORDER — METOPROLOL SUCCINATE ER 25 MG PO TB24: ORAL_TABLET | ORAL | 3 refills | Status: DC

## 2020-08-14 DIAGNOSIS — E785 Hyperlipidemia, unspecified: Secondary | ICD-10-CM | POA: Diagnosis not present

## 2020-08-14 DIAGNOSIS — E039 Hypothyroidism, unspecified: Secondary | ICD-10-CM | POA: Diagnosis not present

## 2020-08-14 DIAGNOSIS — E559 Vitamin D deficiency, unspecified: Secondary | ICD-10-CM | POA: Diagnosis not present

## 2020-08-14 DIAGNOSIS — E119 Type 2 diabetes mellitus without complications: Secondary | ICD-10-CM | POA: Diagnosis not present

## 2020-08-18 NOTE — Progress Notes (Signed)
Cardiology Office Note   Date:  08/20/2020   ID:  Jolly, Bleicher 1941-02-03, MRN [id number]  PCP:  [name], MD  Cardiologist:   Minus Breeding, MD   Chief Complaint  Patient presents with  . Shortness of Breath      History of Present Illness: Corey Tyler is a 79 y.o. male who presents for follow up of atrial fib and chronic systolic HF.  He had tremor and was very weak.  He was having follow up with a neurologist.  He has required reduced meds secondary to orthostatic hypotension and weakness.   EF in 2018 was 45 - 50%.  He was in the hospital in October 2020 .  There were no cardiac complications noted and no evidence of heart failure.  He was in sinus rhythm.  This was a small bowel obstruction.  I do note that his ejection fraction is down to 25 to 30% on echo in September.  We tried to treat him with Delene Loll but he really did not tolerate this with low blood pressures.  When he was sent home from the hospital in October they suggested not taking the beta-blocker but he felt his heart racing with this so he restarted.  He was last in the hospital in February with weakness and orthostatic hypotension.   He is s chronically on 3 L of oxygen.  He returns for follow up.  Since he was last he has had no new hospitalizations or ER visits.  He has been followed by Dr. Gwenette Greet at the [facility] and he has had his oxygen increased to 4 L when he is sleeping on 5 L when he is active.  He has chronic dyspnea and weakness.  He gets around slowly in his house and mostly in a wheelchair when he is out.  He has some dizzy spells when he is standing for a while with orthostatic symptoms.  However, has not had any presyncope or syncope.  He says he has no pain.  He has had no weight gain or edema.   Past Medical History:  Diagnosis Date  . A-fib ([place])    a. Noted on 12/2012 interrogation, placed on Apixaban and ultimately stopped NOACs 2/2 personal decision 8/14.  Marland Kitchen AICD (automatic  cardioverter/defibrillator) present    Cardiac defibrillator -dual  St Judes  . Arthritis   . Atrial tachycardia-non sustained    a. Noted on 03/2012 interrogation.  . Chronic systolic heart failure (HCC)    EF down to 20 to 25% per echo November 2012  . COPD (chronic obstructive pulmonary disease) ([place])   . Hyperlipidemia   . Hypertension   . ICD (implantable cardiac defibrillator) in place   . NICM (nonischemic cardiomyopathy) ([place])    a. Normal cors 2000. b. Minimal plaque 2013.  . Old myocardial infarction    a. ?Silent MI. b. Large fixed inferolateral defect c/w with prior infarct, no ischemia. c. Cath in 2000/2013 with only minimal CAD.  Marland Kitchen Presence of permanent cardiac pacemaker   . Pulmonary nodule, right    Last scan in 2010 showing stability; felt to be benign.  Marland Kitchen PVC's (premature ventricular contractions)   . Ventricular tachycardia, polymorphic ([place])    Rx via ICD 12/14    Past Surgical History:  Procedure Laterality Date  . APPENDECTOMY    . CARDIAC CATHETERIZATION  2000  . CARDIAC DEFIBRILLATOR PLACEMENT    . CARDIOVERSION  03/30/2013   Procedure: CARDIOVERSION;  Surgeon: Arnette Norris  Deboraha Sprang, MD;  Location: [facility];  Service: Cardiovascular;;  . COLON SURGERY    . COLONOSCOPY N/A 09/25/2013   Procedure: COLONOSCOPY;  Surgeon: Jerene Bears, MD;  Location: [facility];  Service: Endoscopy;  Laterality: N/A;  . ICD  12/2011   [name]  . IMPLANTABLE CARDIOVERTER DEFIBRILLATOR IMPLANT N/A 01/06/2012   Procedure: IMPLANTABLE CARDIOVERTER DEFIBRILLATOR IMPLANT;  Surgeon: Deboraha Sprang, MD;  Location: [facility] CATH LAB;  Service: Cardiovascular;  Laterality: N/A;  . PACEMAKER IMPLANT     St [name]  . TEE WITHOUT CARDIOVERSION N/A 03/30/2013   Procedure: TRANSESOPHAGEAL ECHOCARDIOGRAM (TEE);  Surgeon: Thayer Headings, MD;  Location: [facility];  Service: Cardiovascular;  Laterality: N/A;  . V TACH ABLATION  06/10/2017  . V TACH ABLATION N/A 06/10/2017   Procedure: V Tach Ablation;   Surgeon: Evans Lance, MD;  Location: [place] CV LAB;  Service: Cardiovascular;  Laterality: N/A;     Current Outpatient Medications  Medication Sig Dispense Refill  . acetaminophen (TYLENOL) 500 MG tablet Take 500 mg by mouth every 6 (six) hours as needed for headache (pain).    Marland Kitchen albuterol (PROVENTIL) (2.5 MG/3ML) 0.083% nebulizer solution Take 2.5 mg by nebulization every 6 (six) hours as needed for wheezing or shortness of breath.    Marland Kitchen albuterol (VENTOLIN HFA) 108 (90 Base) MCG/ACT inhaler Inhale 2 puffs into the lungs every 6 (six) hours as needed for wheezing or shortness of breath.    Marland Kitchen amiodarone (PACERONE) 200 MG tablet Take 200 mg by mouth daily.    . Camphor (VICKS VAPO STEAM IN) Inhale 1 application into the lungs at bedtime as needed (to open the lungs and for nasal congestion).     . Carboxymethylcellul-Glycerin (LUBRICATING EYE DROPS OP) Place 1 drop into both eyes daily as needed (dry eyes).     . cholecalciferol (VITAMIN D) 1000 UNITS tablet Take 1,000 Units by mouth daily.    Marland Kitchen ELIQUIS 5 MG TABS tablet TAKE 1 TABLET BY MOUTH TWICE A DAY (Patient taking differently: Take 5 mg by mouth 2 (two) times daily. ) 60 tablet 5  . ezetimibe (ZETIA) 10 MG tablet Take 10 mg by mouth daily.     . finasteride (PROSCAR) 5 MG tablet Take 5 mg by mouth daily.     . fluticasone (FLONASE) 50 MCG/ACT nasal spray Place 1 spray into both nostrils daily as needed for allergies.     . Fluticasone-Salmeterol (ADVAIR DISKUS) 250-50 MCG/DOSE AEPB Inhale 1 puff into the lungs 2 (two) times daily as needed (shortness of breath/wheezing).     Marland Kitchen levothyroxine (SYNTHROID, LEVOTHROID) 125 MCG tablet Take 125 mcg by mouth daily before breakfast.     . loratadine (CLARITIN) 10 MG tablet Take 10 mg by mouth daily as needed for allergies.     Marland Kitchen MAGNESIUM PO Take 1 tablet by mouth at bedtime.    . Melatonin 3 MG TABS Take 3 mg by mouth at bedtime.    . metoprolol succinate (TOPROL XL) 25 MG 24 hr tablet  TAKE 1/2 TABLET DAILY 45 tablet 3  . nitroGLYCERIN (NITROSTAT) 0.4 MG SL tablet Place 0.4 mg under the tongue every 5 (five) minutes as needed for chest pain.     . Omega-3 Fatty Acids (FISH OIL) 500 MG CAPS Take 500 mg by mouth 2 (two) times daily.    . OXYGEN Inhale 3-6 L into the lungs See admin instructions. Inhale 3 L/min continuously at rest and 4.5-6 L/min with exertion    .  Pediatric Multivit-Minerals-C (FLINTSTONES GUMMIES COMPLETE) CHEW Chew 1 tablet by mouth daily with breakfast.    . POTASSIUM PO Take 1 tablet by mouth at bedtime.    . promethazine (PHENERGAN) 25 MG tablet Take 25 mg by mouth daily as needed for nausea or vomiting.     . Psyllium (METAMUCIL PO) Take 1 Dose by mouth daily. Mix in liquid and drink     . ranolazine (RANEXA) 1000 MG SR tablet Take 1 tablet (1,000 mg total) by mouth 2 (two) times daily. 180 tablet 3  . Tamsulosin HCl (FLOMAX) 0.4 MG CAPS Take 0.4 mg by mouth at bedtime.     . Tiotropium Bromide Monohydrate (SPIRIVA RESPIMAT) 2.5 MCG/ACT AERS Inhale 2 puffs into the lungs every morning.    . torsemide (DEMADEX) 20 MG tablet Take 1 tablet by mouth daily and 1/2 tablet as needed 135 tablet 3   No current facility-administered medications for this visit.    Allergies:   Atorvastatin, Statins, Xarelto [rivaroxaban], Adhesive [tape], Codeine, Isosorbide nitrate, Latex, Levofloxacin, Lisinopril, Lorazepam, Mexiletine, and Sertraline    ROS:  Please see the history of present illness.   Otherwise, review of systems are positive for none.   All other systems are reviewed and negative.    PHYSICAL EXAM: VS:  BP (!) 99/52  , BMI There is no height or weight on file to calculate BMI. GEN: Frail appearing but in no distress NECK:  No jugular venous distention at 90 degrees, waveform within normal limits, carotid upstroke brisk and symmetric, no bruits, no thyromegaly LYMPHATICS:  No cervical adenopathy LUNGS:  Clear to auscultation bilaterally BACK:  No CVA  tenderness CHEST:  Unremarkable HEART:  S1 and S2 within normal limits, no S3, no S4, no clicks, no rubs, no murmurs ABD:  Positive bowel sounds normal in frequency in pitch, no bruits, no rebound, no guarding, unable to assess midline mass or bruit with the patient seated. EXT:  2 plus pulses throughout, no edema, no cyanosis no clubbing SKIN:  No rashes no nodules NEURO:  Cranial nerves II through XII grossly intact, motor grossly intact throughout PSYCH:  Cognitively intact, oriented to person place and time   Recent Labs: 09/13/2019: ALT 23 12/29/2019: Hemoglobin 12.8; Magnesium 2.0; Platelets 202; TSH 1.485 12/30/2019: BUN 19; Creatinine, Ser 1.34; Potassium 3.8; Sodium 137     Lab Results  Component Value Date   TSH 1.485 12/29/2019   ALT 23 09/13/2019   AST 19 09/13/2019   ALKPHOS 59 09/13/2019   BILITOT 0.8 09/13/2019   PROT 5.5 (L) 09/13/2019   ALBUMIN 3.1 (L) 09/13/2019     Wt Readings from Last 3 Encounters:  08/12/20 211 lb (95.7 kg)  05/14/20 210 lb 9.6 oz (95.5 kg)  02/14/20 208 lb (94.3 kg)      Other studies Reviewed: Additional studies/ records that were reviewed today include: Labs Review of the above records demonstrates:  See above   ASSESSMENT AND PLAN:  Cardiomyopathy - The patient's ejection fraction is 25%.  He does not tolerate med titration.  He is not a candidate for advanced therapy.  I am getting check a BNP level to evaluate his chronic dyspnea but I suspect that most of his shortness of breath is coming from his chronic lung disease.  No change in therapy at this point.   Atrial fibrillation - Mr. MATAEO INGWERSEN has a CHA2DS2 - VASc score of 5.   No change in therapy.  Of note his blood pressure fluctuates  above and below 1.5 and he might need adjustment of his Eliquis but he has not to date.  VT - He had follow up last week and is up to day with follow up with his device.    HYPERTENSION -  This is being managed in the context of  treating his heart failure.  He has orthostasis and low blood pressures and cannot tolerate up titration of medications.  CAD - He has no active symptoms.  No change in therapy.   CKD III Creatinine will be checked today with possible med titration as above.    AAA This was 3.2 cm this year.  Follow up in two years.   [place] [facility] He did get vaccinated.  He was convinced to get vaccinated when he was 58 year old nephew died of Covid.    Current medicines are reviewed at length with the patient today.  The patient does not have concerns regarding medicines.  The following changes have been made:   None  Labs/ tests ordered today include:   Orders Placed This Encounter  Procedures  . Brain natriuretic peptide  . CBC  . Comprehensive metabolic panel  . TSH     Disposition:   FU with me in six months.    Signed, Minus Breeding, MD  08/20/2020 2:01 PM    [facility]

## 2020-08-20 ENCOUNTER — Ambulatory Visit: Payer: PPO | Admitting: Cardiology

## 2020-08-20 ENCOUNTER — Encounter: Payer: Self-pay | Admitting: Cardiology

## 2020-08-20 ENCOUNTER — Other Ambulatory Visit: Payer: Self-pay

## 2020-08-20 ENCOUNTER — Ambulatory Visit (INDEPENDENT_AMBULATORY_CARE_PROVIDER_SITE_OTHER): Payer: PPO | Admitting: Emergency Medicine

## 2020-08-20 VITALS — BP 99/52

## 2020-08-20 DIAGNOSIS — I10 Essential (primary) hypertension: Secondary | ICD-10-CM

## 2020-08-20 DIAGNOSIS — I472 Ventricular tachycardia, unspecified: Secondary | ICD-10-CM

## 2020-08-20 DIAGNOSIS — I480 Paroxysmal atrial fibrillation: Secondary | ICD-10-CM | POA: Diagnosis not present

## 2020-08-20 DIAGNOSIS — N1831 Chronic kidney disease, stage 3a: Secondary | ICD-10-CM

## 2020-08-20 DIAGNOSIS — I428 Other cardiomyopathies: Secondary | ICD-10-CM

## 2020-08-20 DIAGNOSIS — I5022 Chronic systolic (congestive) heart failure: Secondary | ICD-10-CM | POA: Diagnosis not present

## 2020-08-20 DIAGNOSIS — Z7189 Other specified counseling: Secondary | ICD-10-CM | POA: Diagnosis not present

## 2020-08-20 LAB — CUP PACEART REMOTE DEVICE CHECK
Battery Remaining Longevity: 4 mo
Battery Remaining Percentage: 4 %
Battery Voltage: 2.62 V
Brady Statistic AP VP Percent: 1 %
Brady Statistic AP VS Percent: 69 %
Brady Statistic AS VP Percent: 1 %
Brady Statistic AS VS Percent: 28 %
Brady Statistic RA Percent Paced: 69 %
Brady Statistic RV Percent Paced: 1 %
Date Time Interrogation Session: 20210928020015
HighPow Impedance: 83 Ohm
HighPow Impedance: 83 Ohm
Implantable Lead Implant Date: 20130213
Implantable Lead Implant Date: 20130213
Implantable Lead Location: 753859
Implantable Lead Location: 753860
Implantable Pulse Generator Implant Date: 20130213
Lead Channel Impedance Value: 390 Ohm
Lead Channel Impedance Value: 440 Ohm
Lead Channel Pacing Threshold Amplitude: 0.625 V
Lead Channel Pacing Threshold Amplitude: 0.75 V
Lead Channel Pacing Threshold Pulse Width: 0.5 ms
Lead Channel Pacing Threshold Pulse Width: 0.6 ms
Lead Channel Sensing Intrinsic Amplitude: 12 mV
Lead Channel Sensing Intrinsic Amplitude: 2.1 mV
Lead Channel Setting Pacing Amplitude: 1.625
Lead Channel Setting Pacing Amplitude: 2.5 V
Lead Channel Setting Pacing Pulse Width: 0.6 ms
Lead Channel Setting Sensing Sensitivity: 0.5 mV
Pulse Gen Serial Number: 7003419

## 2020-08-20 NOTE — Patient Instructions (Signed)
Medication Instructions:  Your physician recommends that you continue on your current medications as directed. Please refer to the Current Medication list given to you today.  *If you need a refill on your cardiac medications before your next appointment, please call your pharmacy*  Lab Work: Your physician recommends that you return for lab work TODAY:   BNP  CBC  CMET  TSH If you have labs (blood work) drawn today and your tests are completely normal, you will receive your results only by: Marland Kitchen MyChart Message (if you have MyChart) OR . A paper copy in the mail If you have any lab test that is abnormal or we need to change your treatment, we will call you to review the results.  Testing/Procedures: NONE ordered at this time of appointment   Follow-Up: At [facility], you and your health needs are our priority.  As part of our continuing mission to provide you with exceptional heart care, we have created designated Provider Care Teams.  These Care Teams include your primary Cardiologist (physician) and Advanced Practice Providers (APPs -  Physician Assistants and Nurse Practitioners) who all work together to provide you with the care you need, when you need it.  Your next appointment:   6 month(s)  The format for your next appointment:   In Person  Provider:   Minus Breeding, MD  Other Instructions

## 2020-08-21 DIAGNOSIS — J449 Chronic obstructive pulmonary disease, unspecified: Secondary | ICD-10-CM | POA: Diagnosis not present

## 2020-08-21 DIAGNOSIS — I480 Paroxysmal atrial fibrillation: Secondary | ICD-10-CM | POA: Diagnosis not present

## 2020-08-21 DIAGNOSIS — I252 Old myocardial infarction: Secondary | ICD-10-CM | POA: Diagnosis not present

## 2020-08-21 DIAGNOSIS — E785 Hyperlipidemia, unspecified: Secondary | ICD-10-CM | POA: Diagnosis not present

## 2020-08-21 DIAGNOSIS — I472 Ventricular tachycardia: Secondary | ICD-10-CM | POA: Diagnosis not present

## 2020-08-21 DIAGNOSIS — E039 Hypothyroidism, unspecified: Secondary | ICD-10-CM | POA: Diagnosis not present

## 2020-08-21 DIAGNOSIS — E559 Vitamin D deficiency, unspecified: Secondary | ICD-10-CM | POA: Diagnosis not present

## 2020-08-21 DIAGNOSIS — I10 Essential (primary) hypertension: Secondary | ICD-10-CM | POA: Diagnosis not present

## 2020-08-21 DIAGNOSIS — E119 Type 2 diabetes mellitus without complications: Secondary | ICD-10-CM | POA: Diagnosis not present

## 2020-08-21 DIAGNOSIS — I5022 Chronic systolic (congestive) heart failure: Secondary | ICD-10-CM | POA: Diagnosis not present

## 2020-08-21 DIAGNOSIS — F419 Anxiety disorder, unspecified: Secondary | ICD-10-CM | POA: Diagnosis not present

## 2020-08-21 DIAGNOSIS — K219 Gastro-esophageal reflux disease without esophagitis: Secondary | ICD-10-CM | POA: Diagnosis not present

## 2020-08-21 LAB — COMPREHENSIVE METABOLIC PANEL
ALT: 17 IU/L (ref 0–44)
AST: 13 IU/L (ref 0–40)
Albumin/Globulin Ratio: 1.8 (ref 1.2–2.2)
Albumin: 3.7 g/dL (ref 3.7–4.7)
Alkaline Phosphatase: 81 IU/L (ref 44–121)
BUN/Creatinine Ratio: 10 (ref 10–24)
BUN: 15 mg/dL (ref 8–27)
Bilirubin Total: 0.5 mg/dL (ref 0.0–1.2)
CO2: 28 mmol/L (ref 20–29)
Calcium: 8.6 mg/dL (ref 8.6–10.2)
Chloride: 98 mmol/L (ref 96–106)
Creatinine, Ser: 1.5 mg/dL — ABNORMAL HIGH (ref 0.76–1.27)
GFR calc Af Amer: 50 mL/min/{1.73_m2} — ABNORMAL LOW (ref >59)
GFR calc non Af Amer: 43 mL/min/{1.73_m2} — ABNORMAL LOW (ref >59)
Globulin, Total: 2.1 g/dL (ref 1.5–4.5)
Glucose: 93 mg/dL (ref 65–99)
Potassium: 4 mmol/L (ref 3.5–5.2)
Sodium: 137 mmol/L (ref 134–144)
Total Protein: 5.8 g/dL — ABNORMAL LOW (ref 6.0–8.5)

## 2020-08-21 LAB — CBC
Hematocrit: 38 % (ref 37.5–51.0)
Hemoglobin: 12.6 g/dL — ABNORMAL LOW (ref 13.0–17.7)
MCH: 32 pg (ref 26.6–33.0)
MCHC: 33.2 g/dL (ref 31.5–35.7)
MCV: 96 fL (ref 79–97)
Platelets: 168 10*3/uL (ref 150–450)
RBC: 3.94 x10E6/uL — ABNORMAL LOW (ref 4.14–5.80)
RDW: 13 % (ref 11.6–15.4)
WBC: 5 10*3/uL (ref 3.4–10.8)

## 2020-08-21 LAB — TSH: TSH: 2.68 u[IU]/mL (ref 0.450–4.500)

## 2020-08-21 LAB — BRAIN NATRIURETIC PEPTIDE: BNP: 145.6 pg/mL — ABNORMAL HIGH (ref 0.0–100.0)

## 2020-08-23 NOTE — Progress Notes (Signed)
Remote ICD transmission.   

## 2020-08-26 DIAGNOSIS — F5101 Primary insomnia: Secondary | ICD-10-CM | POA: Diagnosis not present

## 2020-08-26 DIAGNOSIS — R300 Dysuria: Secondary | ICD-10-CM | POA: Diagnosis not present

## 2020-08-26 DIAGNOSIS — B029 Zoster without complications: Secondary | ICD-10-CM | POA: Diagnosis not present

## 2020-10-25 ENCOUNTER — Encounter: Payer: Self-pay | Admitting: Internal Medicine

## 2020-10-25 ENCOUNTER — Other Ambulatory Visit: Payer: Self-pay

## 2020-10-25 ENCOUNTER — Ambulatory Visit: Payer: PPO | Admitting: Internal Medicine

## 2020-10-25 VITALS — BP 120/66 | HR 70 | Ht 72.0 in | Wt 211.8 lb

## 2020-10-25 DIAGNOSIS — I4819 Other persistent atrial fibrillation: Secondary | ICD-10-CM

## 2020-10-25 DIAGNOSIS — I472 Ventricular tachycardia, unspecified: Secondary | ICD-10-CM

## 2020-10-25 DIAGNOSIS — I428 Other cardiomyopathies: Secondary | ICD-10-CM

## 2020-10-25 DIAGNOSIS — Z95810 Presence of automatic (implantable) cardiac defibrillator: Secondary | ICD-10-CM

## 2020-10-25 NOTE — Patient Instructions (Signed)

## 2020-10-25 NOTE — Progress Notes (Signed)
Patient Care Team: [name], MD as PCP - General (Internal Medicine) Minus Breeding, MD as PCP - Cardiology (Cardiology)   HPI  Corey Tyler is a 79 y.o. male Seen in followup for ICD implantation 2/13 for primary prevention. This occurred in the context of ischemic/nonischemic cardiomyopathy with underlying bradycardia.    December 2014 he had appropriate ICD discharge for polymorphic ventricular tachycardia. His Coreg was up titrated.   Subsequent Myoview scan was not gated; it demonstrated a large inferolateral and anterolateral scar without ischemia.  He also has a history of atrial fibrillation. In the past he was treated with Rivaroxaban which was complicated by GI bleeding. He was then treated with apixaban. He is also been treated with amiodarone.  12/17 he was admitted with sustained ventricular tachycardia below his detection rate. Amiodarone was uptitrated device was reprogrammed.    He was ablated  7/18 (GT)   11/18 Interval ventricular tachycardia prompted the recent introduction of ranolazine adjunctive to his amiodarone;  ranolazine was uptitrated from 500--1000 mg twice daily.  Hospitalized/20 with a small bowel obstruction and interval respiratory failure, and again in 2/21 having presented with weakness and dizziness and found to be orthostatic.  Multiple medications were held temporarily.  Subsequent to discharge, he has had problems with hypotension.  He discontinued his torsemide.  Metoprolol was further decreased.  Continues to struggle with shortness of breath now on 4 L of oxygen.  Some edema.  Weakness and dizziness.  Hypotension has precluded medication up titration.  Reviewed Dr. Tyrone Schimke notes 9/21.  There continues to be this discrepancy in the interpretation of his lung disease.  The family is quite insistent that Dr. Gwenette Greet says it is cardiac; as noted previously chest CT from 2010 had demonstrated emphysema and a chest x-ray 2021 continues to  describe scarring.  BNP was just minimally above normal     DATE TEST EF   2008 echo  40-45%   1/14 echo    15 %   8/17 echo 25 %   10/18 Echo   45-50%   9/20 Echo  25-30%       Date Cr K TSH LFTs Hgb PFTs  5/15    8.63 23    12/17    4.55 18    10/18   1.603 27    2/19   4.03 17 14.1   2/21 1.34 3.8 1.48 20(10/20) 12.8   9/21 1.50 4.0 2.68 17 12.6        Past Medical History:  Diagnosis Date  . A-fib ([place])    a. Noted on 12/2012 interrogation, placed on Apixaban and ultimately stopped NOACs 2/2 personal decision 8/14.  Marland Kitchen AICD (automatic cardioverter/defibrillator) present    Cardiac defibrillator -dual  [employer] Judes  . Arthritis   . Atrial tachycardia-non sustained    a. Noted on 03/2012 interrogation.  . Chronic systolic heart failure (HCC)    EF down to 20 to 25% per echo November 2012  . COPD (chronic obstructive pulmonary disease) ([place])   . Hyperlipidemia   . Hypertension   . ICD (implantable cardiac defibrillator) in place   . NICM (nonischemic cardiomyopathy) ([place])    a. Normal cors 2000. b. Minimal plaque 2013.  . Old myocardial infarction    a. ?Silent MI. b. Large fixed inferolateral defect c/w with prior infarct, no ischemia. c. Cath in 2000/2013 with only minimal CAD.  Marland Kitchen Presence of permanent cardiac pacemaker   . Pulmonary nodule, right  Last scan in 2010 showing stability; felt to be benign.  Marland Kitchen PVC's (premature ventricular contractions)   . Ventricular tachycardia, polymorphic ([place])    Rx via ICD 12/14    Past Surgical History:  Procedure Laterality Date  . APPENDECTOMY    . CARDIAC CATHETERIZATION  2000  . CARDIAC DEFIBRILLATOR PLACEMENT    . CARDIOVERSION  03/30/2013   Procedure: CARDIOVERSION;  Surgeon: Thayer Headings, MD;  Location: [facility];  Service: Cardiovascular;;  . COLON SURGERY    . COLONOSCOPY N/A 09/25/2013   Procedure: COLONOSCOPY;  Surgeon: Jerene Bears, MD;  Location: [facility];  Service: Endoscopy;  Laterality: N/A;  . ICD   12/2011   [name]  . IMPLANTABLE CARDIOVERTER DEFIBRILLATOR IMPLANT N/A 01/06/2012   Procedure: IMPLANTABLE CARDIOVERTER DEFIBRILLATOR IMPLANT;  Surgeon: Deboraha Sprang, MD;  Location: [facility] CATH LAB;  Service: Cardiovascular;  Laterality: N/A;  . PACEMAKER IMPLANT     St [name]  . TEE WITHOUT CARDIOVERSION N/A 03/30/2013   Procedure: TRANSESOPHAGEAL ECHOCARDIOGRAM (TEE);  Surgeon: Thayer Headings, MD;  Location: [place];  Service: Cardiovascular;  Laterality: N/A;  . V TACH ABLATION  06/10/2017  . V TACH ABLATION N/A 06/10/2017   Procedure: V Tach Ablation;  Surgeon: Evans Lance, MD;  Location: [place] CV LAB;  Service: Cardiovascular;  Laterality: N/A;    Current Outpatient Medications  Medication Sig Dispense Refill  . acetaminophen (TYLENOL) 500 MG tablet Take 500 mg by mouth every 6 (six) hours as needed for headache (pain).    Marland Kitchen albuterol (PROVENTIL) (2.5 MG/3ML) 0.083% nebulizer solution Take 2.5 mg by nebulization every 6 (six) hours as needed for wheezing or shortness of breath.    Marland Kitchen albuterol (VENTOLIN HFA) 108 (90 Base) MCG/ACT inhaler Inhale 2 puffs into the lungs every 6 (six) hours as needed for wheezing or shortness of breath.    Marland Kitchen amiodarone (PACERONE) 200 MG tablet Take 200 mg by mouth daily.    . Camphor (VICKS VAPO STEAM IN) Inhale 1 application into the lungs at bedtime as needed (to open the lungs and for nasal congestion).     . Carboxymethylcellul-Glycerin (LUBRICATING EYE DROPS OP) Place 1 drop into both eyes daily as needed (dry eyes).     . cholecalciferol (VITAMIN D) 1000 UNITS tablet Take 1,000 Units by mouth daily.    Marland Kitchen ELIQUIS 5 MG TABS tablet TAKE 1 TABLET BY MOUTH TWICE A DAY 60 tablet 5  . finasteride (PROSCAR) 5 MG tablet Take 5 mg by mouth daily.     . fluticasone (FLONASE) 50 MCG/ACT nasal spray Place 1 spray into both nostrils daily as needed for allergies.     . Fluticasone-Salmeterol (ADVAIR DISKUS) 250-50 MCG/DOSE AEPB Inhale 1 puff into the lungs 2  (two) times daily as needed (shortness of breath/wheezing).     Marland Kitchen levothyroxine (SYNTHROID, LEVOTHROID) 125 MCG tablet Take 125 mcg by mouth daily before breakfast.     . MAGNESIUM PO Take 1 tablet by mouth at bedtime.    . Melatonin 3 MG TABS Take 3 mg by mouth at bedtime.    . metoprolol succinate (TOPROL XL) 25 MG 24 hr tablet TAKE 1/2 TABLET DAILY 45 tablet 3  . nitroGLYCERIN (NITROSTAT) 0.4 MG SL tablet Place 0.4 mg under the tongue every 5 (five) minutes as needed for chest pain.     . Omega-3 Fatty Acids (FISH OIL) 500 MG CAPS Take 500 mg by mouth 2 (two) times daily.    . OXYGEN Inhale  3-6 L into the lungs See admin instructions. Inhale 3 L/min continuously at rest and 4.5-6 L/min with exertion    . Pediatric Multivit-Minerals-C (FLINTSTONES GUMMIES COMPLETE) CHEW Chew 1 tablet by mouth daily with breakfast.    . POTASSIUM PO Take 1 tablet by mouth at bedtime.    . promethazine (PHENERGAN) 25 MG tablet Take 25 mg by mouth daily as needed for nausea or vomiting.     . Psyllium (METAMUCIL PO) Take 1 Dose by mouth daily. Mix in liquid and drink     . ranolazine (RANEXA) 1000 MG SR tablet Take 1 tablet (1,000 mg total) by mouth 2 (two) times daily. 180 tablet 3  . Tamsulosin HCl (FLOMAX) 0.4 MG CAPS Take 0.4 mg by mouth at bedtime.     . Tiotropium Bromide Monohydrate (SPIRIVA RESPIMAT) 2.5 MCG/ACT AERS Inhale 2 puffs into the lungs every morning.    . torsemide (DEMADEX) 20 MG tablet Take 1 tablet by mouth daily and 1/2 tablet as needed 135 tablet 3   No current facility-administered medications for this visit.    Allergies  Allergen Reactions  . Atorvastatin Shortness Of Breath and Cough  . Statins Shortness Of Breath and Other (See Comments)    Muscle and joing pain and "These bother my lungs and make my gait unsteady, also"  . Xarelto [Rivaroxaban] Other (See Comments)    Bleeding  . Adhesive [Tape] Hives, Itching and Rash  . Codeine Nausea Only  . Isosorbide Nitrate Other (See  Comments)    Made him feel bad  . Latex Itching and Other (See Comments)    Rash, itching, burning  . Levofloxacin Other (See Comments)    Tachycardia  . Lisinopril Cough  . Lorazepam Other (See Comments)    "felt bad"  . Mexiletine Other (See Comments)    GI distress  . Sertraline Other (See Comments)    Made him feel bad    Review of Systems negative except from HPI and PMH  Physical Exam BP 120/66   Pulse 70   Ht 6' (1.829 m)   Wt 211 lb 12.8 oz (96.1 kg)   SpO2 94%   BMI 28.73 kg/m  Well developed and well nourished in no acute distress wearing oxygen HENT normal Neck supple with JVP-8 cm Clear Device pocket well healed; without hematoma or erythema.  There is no tethering  Regular rate and rhythm, no   murmur Abd-soft with active BS No Clubbing cyanosis tr edema Skin-warm and dry A & Oriented  Grossly normal sensory and motor function  ECG sinus at 70 Intervals 20/12/45 Frequent PVCs  Assessment and plan  Atrial fibrillation paroxysmal/persistent   High risk medication amiodarone  Chronotropic incompetence   Implantable defibrillator-St [name]  The patient's device was interrogated.  The information was reviewed. No changes were made in the programming.     Ischemic/nonischemic cardiomyopathy interval improvement  Congestive heart failure-chronic-systolic  Ventricular tachycardia--recurrent   Hypotension    Lung disease-oxygen dependent  Mildly edematous.  Multifactorial.  Likely some component of heart failure secondary to LV dysfunction but also his right-sided heart failure aggravated by his lung disease.  With dizziness and generalized low blood pressure will not push the diuretics at this time.  Long discussion as to the underlying explanation for his oxygen dependence.  As noted above, their impression from pulmonary is that it is a cardiac issue and it is certainly been our impression that it is pulmonary issue.  His ICD is approaching ERI.  We had a lengthy discussion regarding device generator replacement both around the procedure itself but more importantly around the role of device and activation in the likely scenario where in his pulmonary disease worsens.  He understands that there may be a time to reactivate his defibrillator.  His wife also voices that she understands

## 2020-11-12 ENCOUNTER — Telehealth: Payer: Self-pay

## 2020-11-12 NOTE — Telephone Encounter (Signed)
Merlin alert received, battery has reached RRT 11/11/20. Called patient to inform. No answer, LMOMV.   Per last OV w/ Dr. [name] on 10/25/20, patient had previous discussion in regard to gen change.

## 2020-11-12 NOTE — Telephone Encounter (Signed)
Attempted phone call to pt.  Left voicemail message to contact RN at [phone]. 

## 2020-11-12 NOTE — Telephone Encounter (Signed)
Follow up   Pt is returning call to [facility]   Please call back

## 2020-11-12 NOTE — Telephone Encounter (Signed)
Pt returning call

## 2020-11-12 NOTE — Telephone Encounter (Signed)
Returning patients phone call. Advised patient I will forward to Dr. Aquilla Hacker nurse and she will call to discuss apt. For gen change. Agreeable to plan.

## 2020-11-12 NOTE — Telephone Encounter (Signed)
Spoke with pt re: need for ICD generator change due to device reaching ERI.  Pt requests scheduling for 12/04/2020 in the PM if possible.  Pt advised once procedure and labs are scheduled will call him back with specific instructions.  Pt verbalizes understanding and agrees with current plan.

## 2020-11-13 NOTE — Telephone Encounter (Signed)
Spoke with pt and reviewed device instruction letter with pt.  See letter for complete instructions. Hard copy of letter mailed to pt along with surgical scrub instructions.  Pt will plan to pick up scrub at lab appointment scheduled for 12/02/2020.  Pt verbalizes understanding and agrees with current plan.

## 2020-11-15 ENCOUNTER — Telehealth: Payer: Self-pay | Admitting: Cardiology

## 2020-11-15 NOTE — Telephone Encounter (Signed)
Pt called with some urinary burning and difficulty voiding.  He could not reach his PCP.  I recommended he go to urgent care but he did not wish to do so.   I was able to reach answering service for [facility] and they will have his PCP call him

## 2020-11-19 ENCOUNTER — Ambulatory Visit (INDEPENDENT_AMBULATORY_CARE_PROVIDER_SITE_OTHER): Payer: PPO

## 2020-11-19 DIAGNOSIS — I4819 Other persistent atrial fibrillation: Secondary | ICD-10-CM

## 2020-11-19 DIAGNOSIS — I472 Ventricular tachycardia, unspecified: Secondary | ICD-10-CM

## 2020-11-19 LAB — CUP PACEART REMOTE DEVICE CHECK
Battery Remaining Longevity: 0 mo
Battery Voltage: 2.59 V
Brady Statistic AP VP Percent: 1 %
Brady Statistic AP VS Percent: 52 %
Brady Statistic AS VP Percent: 1 %
Brady Statistic AS VS Percent: 47 %
Brady Statistic RA Percent Paced: 51 %
Brady Statistic RV Percent Paced: 1 %
Date Time Interrogation Session: 20211228032739
HighPow Impedance: 77 Ohm
HighPow Impedance: 77 Ohm
Implantable Lead Implant Date: 20130213
Implantable Lead Implant Date: 20130213
Implantable Lead Location: 753859
Implantable Lead Location: 753860
Implantable Pulse Generator Implant Date: 20130213
Lead Channel Impedance Value: 390 Ohm
Lead Channel Impedance Value: 430 Ohm
Lead Channel Pacing Threshold Amplitude: 0.5 V
Lead Channel Pacing Threshold Amplitude: 1 V
Lead Channel Pacing Threshold Pulse Width: 0.5 ms
Lead Channel Pacing Threshold Pulse Width: 0.6 ms
Lead Channel Sensing Intrinsic Amplitude: 1.7 mV
Lead Channel Sensing Intrinsic Amplitude: 12 mV
Lead Channel Setting Pacing Amplitude: 1.5 V
Lead Channel Setting Pacing Amplitude: 2.5 V
Lead Channel Setting Pacing Pulse Width: 0.6 ms
Lead Channel Setting Sensing Sensitivity: 0.5 mV
Pulse Gen Serial Number: 7003419

## 2020-11-21 DIAGNOSIS — I429 Cardiomyopathy, unspecified: Secondary | ICD-10-CM | POA: Diagnosis not present

## 2020-11-21 DIAGNOSIS — I2510 Atherosclerotic heart disease of native coronary artery without angina pectoris: Secondary | ICD-10-CM | POA: Diagnosis not present

## 2020-11-21 DIAGNOSIS — E039 Hypothyroidism, unspecified: Secondary | ICD-10-CM | POA: Diagnosis not present

## 2020-11-21 DIAGNOSIS — I5022 Chronic systolic (congestive) heart failure: Secondary | ICD-10-CM | POA: Diagnosis not present

## 2020-11-21 DIAGNOSIS — R399 Unspecified symptoms and signs involving the genitourinary system: Secondary | ICD-10-CM | POA: Diagnosis not present

## 2020-11-21 DIAGNOSIS — I10 Essential (primary) hypertension: Secondary | ICD-10-CM | POA: Diagnosis not present

## 2020-11-21 DIAGNOSIS — E119 Type 2 diabetes mellitus without complications: Secondary | ICD-10-CM | POA: Diagnosis not present

## 2020-11-21 DIAGNOSIS — K219 Gastro-esophageal reflux disease without esophagitis: Secondary | ICD-10-CM | POA: Diagnosis not present

## 2020-11-21 DIAGNOSIS — K5901 Slow transit constipation: Secondary | ICD-10-CM | POA: Diagnosis not present

## 2020-11-21 DIAGNOSIS — E785 Hyperlipidemia, unspecified: Secondary | ICD-10-CM | POA: Diagnosis not present

## 2020-11-21 DIAGNOSIS — F419 Anxiety disorder, unspecified: Secondary | ICD-10-CM | POA: Diagnosis not present

## 2020-11-21 DIAGNOSIS — J449 Chronic obstructive pulmonary disease, unspecified: Secondary | ICD-10-CM | POA: Diagnosis not present

## 2020-11-26 ENCOUNTER — Telehealth: Payer: Self-pay | Admitting: Internal Medicine

## 2020-11-26 NOTE — Telephone Encounter (Signed)
Patient states that he is returning Marsha's call from this morning.

## 2020-11-26 NOTE — Telephone Encounter (Signed)
    Pt is calling back to follow up  

## 2020-11-27 NOTE — Telephone Encounter (Signed)
Spoke with pt who states he lost his copy of device instruction letter and requests RN mail him another copy.  Pt advised will place hard copy of letter in the mail this afternoon.  Pt reminded to pick up surgical scrub at his lab appointment on 12/02/2020.  Pt verbalizes understanding and agrees with current plan.

## 2020-12-02 ENCOUNTER — Telehealth: Payer: Self-pay | Admitting: Internal Medicine

## 2020-12-02 ENCOUNTER — Other Ambulatory Visit (HOSPITAL_COMMUNITY): Payer: PPO

## 2020-12-02 ENCOUNTER — Other Ambulatory Visit: Payer: PPO

## 2020-12-02 NOTE — Telephone Encounter (Signed)
Spoke with pt who states he not having current symptoms but his wife tested positive this am for Covid.  Pt's lab appointment for this office was canceled but pt will still go for Covid testing today as scheduled.

## 2020-12-02 NOTE — Telephone Encounter (Signed)
Corey Tyler is calling stating his wife tested positive for Covid. His lab appointment for blood work today with our office has been canceled due to this. He is wanting to know if he can keep his scheduled Covid test for today for his upcoming scheduled procedure. Please advise.

## 2020-12-02 NOTE — Progress Notes (Signed)
Remote ICD transmission.   

## 2020-12-03 ENCOUNTER — Telehealth: Payer: Self-pay

## 2020-12-03 ENCOUNTER — Other Ambulatory Visit (HOSPITAL_COMMUNITY)
Admission: RE | Admit: 2020-12-03 | Discharge: 2020-12-03 | Disposition: A | Discharge status: Home / Self Care | Payer: PPO | Source: Ambulatory Visit | Attending: Internal Medicine | Admitting: Internal Medicine

## 2020-12-03 DIAGNOSIS — Z01812 Encounter for preprocedural laboratory examination: Secondary | ICD-10-CM | POA: Diagnosis not present

## 2020-12-03 DIAGNOSIS — U071 COVID-19: Secondary | ICD-10-CM | POA: Insufficient documentation

## 2020-12-03 LAB — SARS CORONAVIRUS 2 (TAT 6-24 HRS): SARS Coronavirus 2: POSITIVE — AB

## 2020-12-03 NOTE — Telephone Encounter (Signed)
Spoke with pt who states he went to have Covid testing  But has not yet received results.  Pt advised most likely results will not be available until tomorrow.  Pt verbalized understanding and thanked Therapist, sports for the call.

## 2020-12-03 NOTE — Telephone Encounter (Signed)
SENT TO PRE OP IN ERROR

## 2020-12-03 NOTE — Telephone Encounter (Signed)
Patient is calling to follow up with Dr. Olin Pia nurse. He states he had his COVID test and he would like to provide an update.

## 2020-12-03 NOTE — Telephone Encounter (Signed)
Spoke with pt who reports his wife was diagnosed with Covid yesterday.  Pt is also having symptoms now.  He did not go for his pre procedure labs or Covid testing and wishes to cancel procedure at this time.  Pt states he will go for a rapid Covid screening at [facility] today and will contact RN with the results.  Generator change canceled with cath lab.

## 2020-12-04 ENCOUNTER — Encounter (HOSPITAL_COMMUNITY): Admission: RE | Payer: Self-pay | Source: Home / Self Care

## 2020-12-04 ENCOUNTER — Ambulatory Visit (HOSPITAL_COMMUNITY): Admission: RE | Admit: 2020-12-04 | Payer: PPO | Source: Home / Self Care | Admitting: Internal Medicine

## 2020-12-04 SURGERY — ICD GENERATOR CHANGEOUT

## 2020-12-04 NOTE — Telephone Encounter (Signed)
Spoke with pt and pt's wife and advised pt's Covid test is positive.  Pt's procedure is already canceled.  Pt advised to contact his PCP for guidance.  Will contact pt next week to reschedule generator change.  Pt verbalizes understanding and agrees with current plan.

## 2020-12-04 NOTE — Telephone Encounter (Signed)
Corey Tyler is calling requesting to speak with Rosann Auerbach to see what variant of Covid he tested positive for. Please advise.

## 2020-12-08 DIAGNOSIS — R42 Dizziness and giddiness: Secondary | ICD-10-CM | POA: Diagnosis not present

## 2020-12-08 DIAGNOSIS — U071 COVID-19: Secondary | ICD-10-CM | POA: Diagnosis not present

## 2020-12-08 DIAGNOSIS — R0902 Hypoxemia: Secondary | ICD-10-CM | POA: Diagnosis not present

## 2020-12-08 DIAGNOSIS — I959 Hypotension, unspecified: Secondary | ICD-10-CM | POA: Diagnosis not present

## 2020-12-08 DIAGNOSIS — R55 Syncope and collapse: Secondary | ICD-10-CM | POA: Diagnosis not present

## 2020-12-09 ENCOUNTER — Emergency Department (HOSPITAL_COMMUNITY): Payer: No Typology Code available for payment source

## 2020-12-09 ENCOUNTER — Inpatient Hospital Stay (HOSPITAL_COMMUNITY)
Admission: EM | Admit: 2020-12-09 | Discharge: 2020-12-11 | DRG: 291 | Disposition: A | Discharge status: Home / Self Care | Payer: No Typology Code available for payment source | Attending: Internal Medicine | Admitting: Internal Medicine

## 2020-12-09 ENCOUNTER — Other Ambulatory Visit (HOSPITAL_COMMUNITY): Payer: PPO

## 2020-12-09 ENCOUNTER — Telehealth: Payer: Self-pay

## 2020-12-09 ENCOUNTER — Other Ambulatory Visit: Payer: Self-pay

## 2020-12-09 ENCOUNTER — Encounter (HOSPITAL_COMMUNITY): Payer: Self-pay

## 2020-12-09 DIAGNOSIS — I482 Chronic atrial fibrillation, unspecified: Secondary | ICD-10-CM | POA: Diagnosis present

## 2020-12-09 DIAGNOSIS — J449 Chronic obstructive pulmonary disease, unspecified: Secondary | ICD-10-CM

## 2020-12-09 DIAGNOSIS — S80919A Unspecified superficial injury of unspecified knee, initial encounter: Secondary | ICD-10-CM | POA: Diagnosis not present

## 2020-12-09 DIAGNOSIS — Z87891 Personal history of nicotine dependence: Secondary | ICD-10-CM

## 2020-12-09 DIAGNOSIS — M79671 Pain in right foot: Secondary | ICD-10-CM | POA: Diagnosis not present

## 2020-12-09 DIAGNOSIS — Z889 Allergy status to unspecified drugs, medicaments and biological substances status: Secondary | ICD-10-CM

## 2020-12-09 DIAGNOSIS — Z9981 Dependence on supplemental oxygen: Secondary | ICD-10-CM

## 2020-12-09 DIAGNOSIS — E785 Hyperlipidemia, unspecified: Secondary | ICD-10-CM | POA: Diagnosis present

## 2020-12-09 DIAGNOSIS — Z79899 Other long term (current) drug therapy: Secondary | ICD-10-CM

## 2020-12-09 DIAGNOSIS — R0902 Hypoxemia: Secondary | ICD-10-CM | POA: Diagnosis not present

## 2020-12-09 DIAGNOSIS — I130 Hypertensive heart and chronic kidney disease with heart failure and stage 1 through stage 4 chronic kidney disease, or unspecified chronic kidney disease: Principal | ICD-10-CM | POA: Diagnosis present

## 2020-12-09 DIAGNOSIS — J441 Chronic obstructive pulmonary disease with (acute) exacerbation: Secondary | ICD-10-CM | POA: Diagnosis present

## 2020-12-09 DIAGNOSIS — Z825 Family history of asthma and other chronic lower respiratory diseases: Secondary | ICD-10-CM

## 2020-12-09 DIAGNOSIS — Z885 Allergy status to narcotic agent status: Secondary | ICD-10-CM

## 2020-12-09 DIAGNOSIS — B9729 Other coronavirus as the cause of diseases classified elsewhere: Secondary | ICD-10-CM | POA: Diagnosis not present

## 2020-12-09 DIAGNOSIS — W1830XA Fall on same level, unspecified, initial encounter: Secondary | ICD-10-CM | POA: Diagnosis present

## 2020-12-09 DIAGNOSIS — J189 Pneumonia, unspecified organism: Secondary | ICD-10-CM | POA: Diagnosis not present

## 2020-12-09 DIAGNOSIS — G319 Degenerative disease of nervous system, unspecified: Secondary | ICD-10-CM | POA: Diagnosis not present

## 2020-12-09 DIAGNOSIS — Z91040 Latex allergy status: Secondary | ICD-10-CM

## 2020-12-09 DIAGNOSIS — Z7951 Long term (current) use of inhaled steroids: Secondary | ICD-10-CM

## 2020-12-09 DIAGNOSIS — I252 Old myocardial infarction: Secondary | ICD-10-CM

## 2020-12-09 DIAGNOSIS — Z950 Presence of cardiac pacemaker: Secondary | ICD-10-CM

## 2020-12-09 DIAGNOSIS — Z95810 Presence of automatic (implantable) cardiac defibrillator: Secondary | ICD-10-CM

## 2020-12-09 DIAGNOSIS — I428 Other cardiomyopathies: Secondary | ICD-10-CM | POA: Diagnosis present

## 2020-12-09 DIAGNOSIS — I2510 Atherosclerotic heart disease of native coronary artery without angina pectoris: Secondary | ICD-10-CM | POA: Diagnosis present

## 2020-12-09 DIAGNOSIS — M1990 Unspecified osteoarthritis, unspecified site: Secondary | ICD-10-CM | POA: Diagnosis present

## 2020-12-09 DIAGNOSIS — R55 Syncope and collapse: Secondary | ICD-10-CM | POA: Diagnosis not present

## 2020-12-09 DIAGNOSIS — Z7901 Long term (current) use of anticoagulants: Secondary | ICD-10-CM

## 2020-12-09 DIAGNOSIS — Z888 Allergy status to other drugs, medicaments and biological substances status: Secondary | ICD-10-CM

## 2020-12-09 DIAGNOSIS — I313 Pericardial effusion (noninflammatory): Secondary | ICD-10-CM | POA: Diagnosis present

## 2020-12-09 DIAGNOSIS — U071 COVID-19: Secondary | ICD-10-CM | POA: Diagnosis not present

## 2020-12-09 DIAGNOSIS — Y92000 Kitchen of unspecified non-institutional (private) residence as  the place of occurrence of the external cause: Secondary | ICD-10-CM

## 2020-12-09 DIAGNOSIS — Z8249 Family history of ischemic heart disease and other diseases of the circulatory system: Secondary | ICD-10-CM

## 2020-12-09 DIAGNOSIS — I499 Cardiac arrhythmia, unspecified: Secondary | ICD-10-CM | POA: Diagnosis not present

## 2020-12-09 DIAGNOSIS — S0990XA Unspecified injury of head, initial encounter: Secondary | ICD-10-CM | POA: Diagnosis not present

## 2020-12-09 DIAGNOSIS — S92311A Displaced fracture of first metatarsal bone, right foot, initial encounter for closed fracture: Secondary | ICD-10-CM | POA: Diagnosis not present

## 2020-12-09 DIAGNOSIS — Z9109 Other allergy status, other than to drugs and biological substances: Secondary | ICD-10-CM

## 2020-12-09 DIAGNOSIS — J9601 Acute respiratory failure with hypoxia: Secondary | ICD-10-CM | POA: Diagnosis present

## 2020-12-09 DIAGNOSIS — I480 Paroxysmal atrial fibrillation: Secondary | ICD-10-CM | POA: Diagnosis present

## 2020-12-09 DIAGNOSIS — G9389 Other specified disorders of brain: Secondary | ICD-10-CM | POA: Diagnosis not present

## 2020-12-09 DIAGNOSIS — I493 Ventricular premature depolarization: Secondary | ICD-10-CM | POA: Diagnosis present

## 2020-12-09 DIAGNOSIS — M25562 Pain in left knee: Secondary | ICD-10-CM | POA: Diagnosis not present

## 2020-12-09 DIAGNOSIS — Z79890 Hormone replacement therapy (postmenopausal): Secondary | ICD-10-CM

## 2020-12-09 DIAGNOSIS — I472 Ventricular tachycardia: Secondary | ICD-10-CM | POA: Diagnosis present

## 2020-12-09 DIAGNOSIS — N182 Chronic kidney disease, stage 2 (mild): Secondary | ICD-10-CM | POA: Diagnosis present

## 2020-12-09 DIAGNOSIS — S92313A Displaced fracture of first metatarsal bone, unspecified foot, initial encounter for closed fracture: Secondary | ICD-10-CM | POA: Diagnosis present

## 2020-12-09 DIAGNOSIS — I5022 Chronic systolic (congestive) heart failure: Secondary | ICD-10-CM | POA: Diagnosis present

## 2020-12-09 DIAGNOSIS — Z8679 Personal history of other diseases of the circulatory system: Secondary | ICD-10-CM

## 2020-12-09 DIAGNOSIS — N400 Enlarged prostate without lower urinary tract symptoms: Secondary | ICD-10-CM | POA: Diagnosis present

## 2020-12-09 DIAGNOSIS — I6782 Cerebral ischemia: Secondary | ICD-10-CM | POA: Diagnosis not present

## 2020-12-09 LAB — HEPATITIS B SURFACE ANTIGEN: Hepatitis B Surface Ag: NONREACTIVE

## 2020-12-09 LAB — BASIC METABOLIC PANEL
Anion gap: 12 (ref 5–15)
BUN: 24 mg/dL — ABNORMAL HIGH (ref 8–23)
CO2: 28 mmol/L (ref 22–32)
Calcium: 8.1 mg/dL — ABNORMAL LOW (ref 8.9–10.3)
Chloride: 96 mmol/L — ABNORMAL LOW (ref 98–111)
Creatinine, Ser: 1.68 mg/dL — ABNORMAL HIGH (ref 0.61–1.24)
GFR, Estimated: 41 mL/min — ABNORMAL LOW (ref >60)
Glucose, Bld: 112 mg/dL — ABNORMAL HIGH (ref 70–99)
Potassium: 3.6 mmol/L (ref 3.5–5.1)
Sodium: 136 mmol/L (ref 135–145)

## 2020-12-09 LAB — TYPE AND SCREEN
ABO/RH(D): A POS
Antibody Screen: NEGATIVE

## 2020-12-09 LAB — CBC WITH DIFFERENTIAL/PLATELET
Abs Immature Granulocytes: 0.08 10*3/uL — ABNORMAL HIGH (ref 0.00–0.07)
Basophils Absolute: 0 10*3/uL (ref 0.0–0.1)
Basophils Relative: 0 %
Eosinophils Absolute: 0 10*3/uL (ref 0.0–0.5)
Eosinophils Relative: 0 %
HCT: 36.3 % — ABNORMAL LOW (ref 39.0–52.0)
Hemoglobin: 11.9 g/dL — ABNORMAL LOW (ref 13.0–17.0)
Immature Granulocytes: 1 %
Lymphocytes Relative: 19 %
Lymphs Abs: 1.1 10*3/uL (ref 0.7–4.0)
MCH: 32.5 pg (ref 26.0–34.0)
MCHC: 32.8 g/dL (ref 30.0–36.0)
MCV: 99.2 fL (ref 80.0–100.0)
Monocytes Absolute: 0.8 10*3/uL (ref 0.1–1.0)
Monocytes Relative: 13 %
Neutro Abs: 3.9 10*3/uL (ref 1.7–7.7)
Neutrophils Relative %: 67 %
Platelets: 164 10*3/uL (ref 150–400)
RBC: 3.66 MIL/uL — ABNORMAL LOW (ref 4.22–5.81)
RDW: 14.6 % (ref 11.5–15.5)
WBC: 5.8 10*3/uL (ref 4.0–10.5)
nRBC: 0 % (ref 0.0–0.2)

## 2020-12-09 LAB — TSH: TSH: 0.833 u[IU]/mL (ref 0.350–4.500)

## 2020-12-09 LAB — PROCALCITONIN: Procalcitonin: <0.1 ng/mL

## 2020-12-09 LAB — TROPONIN I (HIGH SENSITIVITY)
Troponin I (High Sensitivity): 23 ng/L — ABNORMAL HIGH (ref <18)
Troponin I (High Sensitivity): 23 ng/L — ABNORMAL HIGH (ref <18)

## 2020-12-09 LAB — ABO/RH: ABO/RH(D): A POS

## 2020-12-09 LAB — FIBRINOGEN: Fibrinogen: 492 mg/dL — ABNORMAL HIGH (ref 210–475)

## 2020-12-09 LAB — BRAIN NATRIURETIC PEPTIDE
B Natriuretic Peptide: 81.8 pg/mL (ref 0.0–100.0)
B Natriuretic Peptide: 93.6 pg/mL (ref 0.0–100.0)

## 2020-12-09 LAB — MAGNESIUM: Magnesium: 2 mg/dL (ref 1.7–2.4)

## 2020-12-09 LAB — C-REACTIVE PROTEIN: CRP: 15.2 mg/dL — ABNORMAL HIGH (ref <1.0)

## 2020-12-09 LAB — CBG MONITORING, ED: Glucose-Capillary: 91 mg/dL (ref 70–99)

## 2020-12-09 LAB — LACTATE DEHYDROGENASE: LDH: 267 U/L — ABNORMAL HIGH (ref 98–192)

## 2020-12-09 LAB — D-DIMER, QUANTITATIVE: D-Dimer, Quant: 0.39 ug/mL-FEU (ref 0.00–0.50)

## 2020-12-09 LAB — FERRITIN: Ferritin: 195 ng/mL (ref 24–336)

## 2020-12-09 MED ORDER — ALBUTEROL SULFATE HFA 108 (90 BASE) MCG/ACT IN AERS: 2.0000 | INHALATION_SPRAY | Freq: Four times a day (QID) | RESPIRATORY_TRACT | Status: DC | PRN

## 2020-12-09 MED ORDER — ACETAMINOPHEN 500 MG PO TABS
1000.0000 mg | ORAL_TABLET | Freq: Once | ORAL | Status: AC
  Administered 2020-12-09: 1000 mg via ORAL
  Filled 2020-12-09: qty 2

## 2020-12-09 MED ORDER — FLUTICASONE FUROATE-VILANTEROL 200-25 MCG/INH IN AEPB
1.0000 | INHALATION_SPRAY | Freq: Every day | RESPIRATORY_TRACT | Status: DC
  Administered 2020-12-09 – 2020-12-11 (×2): 1 via RESPIRATORY_TRACT
  Filled 2020-12-09: qty 28

## 2020-12-09 MED ORDER — APIXABAN 5 MG PO TABS: 5.0000 mg | ORAL_TABLET | Freq: Two times a day (BID) | ORAL | Status: DC

## 2020-12-09 MED ORDER — DEXAMETHASONE 6 MG PO TABS
6.0000 mg | ORAL_TABLET | Freq: Every day | ORAL | Status: DC
  Administered 2020-12-09 – 2020-12-11 (×3): 6 mg via ORAL
  Filled 2020-12-09 (×2): qty 2
  Filled 2020-12-09: qty 1

## 2020-12-09 MED ORDER — PSYLLIUM 95 % PO PACK
1.0000 | PACK | Freq: Every day | ORAL | Status: DC
  Administered 2020-12-10 – 2020-12-11 (×2): 1 via ORAL
  Filled 2020-12-09 (×2): qty 1

## 2020-12-09 MED ORDER — IPRATROPIUM-ALBUTEROL 20-100 MCG/ACT IN AERS
1.0000 | INHALATION_SPRAY | Freq: Four times a day (QID) | RESPIRATORY_TRACT | Status: DC
  Administered 2020-12-09 – 2020-12-10 (×4): 1 via RESPIRATORY_TRACT
  Filled 2020-12-09: qty 4

## 2020-12-09 MED ORDER — ACETAMINOPHEN 650 MG RE SUPP: 650.0000 mg | Freq: Four times a day (QID) | RECTAL | Status: DC | PRN

## 2020-12-09 MED ORDER — RANOLAZINE ER 500 MG PO TB12
1000.0000 mg | ORAL_TABLET | Freq: Two times a day (BID) | ORAL | Status: DC
  Filled 2020-12-09: qty 2

## 2020-12-09 MED ORDER — RANOLAZINE ER 500 MG PO TB12
500.0000 mg | ORAL_TABLET | Freq: Two times a day (BID) | ORAL | Status: DC
  Administered 2020-12-09 – 2020-12-11 (×4): 500 mg via ORAL
  Filled 2020-12-09 (×5): qty 1

## 2020-12-09 MED ORDER — ADULT MULTIVITAMIN W/MINERALS CH
1.0000 | ORAL_TABLET | Freq: Every day | ORAL | Status: DC
  Administered 2020-12-10 – 2020-12-11 (×2): 1 via ORAL
  Filled 2020-12-09 (×2): qty 1

## 2020-12-09 MED ORDER — ACETAMINOPHEN 500 MG PO TABS: 500.0000 mg | ORAL_TABLET | Freq: Four times a day (QID) | ORAL | Status: DC | PRN

## 2020-12-09 MED ORDER — INSULIN ASPART 100 UNIT/ML ~~LOC~~ SOLN
0.0000 [IU] | Freq: Three times a day (TID) | SUBCUTANEOUS | Status: DC
  Administered 2020-12-10 (×3): 1 [IU] via SUBCUTANEOUS

## 2020-12-09 MED ORDER — UMECLIDINIUM BROMIDE 62.5 MCG/INH IN AEPB
1.0000 | INHALATION_SPRAY | Freq: Every day | RESPIRATORY_TRACT | Status: DC
  Administered 2020-12-09 – 2020-12-11 (×2): 1 via RESPIRATORY_TRACT
  Filled 2020-12-09: qty 7

## 2020-12-09 MED ORDER — OMEGA-3-ACID ETHYL ESTERS 1 G PO CAPS
1.0000 g | ORAL_CAPSULE | Freq: Every day | ORAL | Status: DC
  Administered 2020-12-10 – 2020-12-11 (×2): 1 g via ORAL
  Filled 2020-12-09 (×2): qty 1

## 2020-12-09 MED ORDER — VITAMIN D 25 MCG (1000 UNIT) PO TABS
1000.0000 [IU] | ORAL_TABLET | Freq: Every day | ORAL | Status: DC
  Administered 2020-12-10 – 2020-12-11 (×2): 1000 [IU] via ORAL
  Filled 2020-12-09 (×2): qty 1

## 2020-12-09 MED ORDER — ACETAMINOPHEN 325 MG PO TABS: 650.0000 mg | ORAL_TABLET | Freq: Four times a day (QID) | ORAL | Status: DC | PRN

## 2020-12-09 MED ORDER — SODIUM CHLORIDE 0.9% FLUSH
3.0000 mL | Freq: Two times a day (BID) | INTRAVENOUS | Status: DC
  Administered 2020-12-09 – 2020-12-11 (×5): 3 mL via INTRAVENOUS

## 2020-12-09 MED ORDER — SALINE SPRAY 0.65 % NA SOLN
1.0000 | Freq: Every day | NASAL | Status: DC | PRN
  Filled 2020-12-09: qty 44

## 2020-12-09 MED ORDER — NITROGLYCERIN 0.4 MG SL SUBL: 0.4000 mg | SUBLINGUAL_TABLET | SUBLINGUAL | Status: DC | PRN

## 2020-12-09 MED ORDER — GUAIFENESIN-DM 100-10 MG/5ML PO SYRP: 10.0000 mL | ORAL_SOLUTION | ORAL | Status: DC | PRN

## 2020-12-09 MED ORDER — LEVOTHYROXINE SODIUM 25 MCG PO TABS
125.0000 ug | ORAL_TABLET | Freq: Every day | ORAL | Status: DC
  Administered 2020-12-10 – 2020-12-11 (×2): 125 ug via ORAL
  Filled 2020-12-09 (×2): qty 1

## 2020-12-09 MED ORDER — AMIODARONE HCL 200 MG PO TABS
200.0000 mg | ORAL_TABLET | Freq: Every day | ORAL | Status: DC
  Administered 2020-12-10: 200 mg via ORAL
  Filled 2020-12-09: qty 1

## 2020-12-09 MED ORDER — TAMSULOSIN HCL 0.4 MG PO CAPS
0.4000 mg | ORAL_CAPSULE | Freq: Every day | ORAL | Status: DC
  Administered 2020-12-09 – 2020-12-10 (×2): 0.4 mg via ORAL
  Filled 2020-12-09 (×2): qty 1

## 2020-12-09 MED ORDER — POLYVINYL ALCOHOL 1.4 % OP SOLN
1.0000 [drp] | Freq: Every day | OPHTHALMIC | Status: DC
  Administered 2020-12-10 – 2020-12-11 (×2): 1 [drp] via OPHTHALMIC
  Filled 2020-12-09: qty 15

## 2020-12-09 MED ORDER — METOPROLOL TARTRATE 12.5 MG HALF TABLET
12.5000 mg | ORAL_TABLET | Freq: Two times a day (BID) | ORAL | Status: DC
  Administered 2020-12-09 – 2020-12-11 (×5): 12.5 mg via ORAL
  Filled 2020-12-09 (×5): qty 1

## 2020-12-09 MED ORDER — POTASSIUM CHLORIDE CRYS ER 20 MEQ PO TBCR
40.0000 meq | EXTENDED_RELEASE_TABLET | Freq: Once | ORAL | Status: AC
  Administered 2020-12-09: 40 meq via ORAL
  Filled 2020-12-09: qty 2

## 2020-12-09 MED ORDER — MAGNESIUM OXIDE 400 (241.3 MG) MG PO TABS
400.0000 mg | ORAL_TABLET | Freq: Every day | ORAL | Status: DC
  Administered 2020-12-09 – 2020-12-10 (×2): 400 mg via ORAL
  Filled 2020-12-09 (×2): qty 1

## 2020-12-09 MED ORDER — FINASTERIDE 5 MG PO TABS
5.0000 mg | ORAL_TABLET | Freq: Every day | ORAL | Status: DC
  Administered 2020-12-10 – 2020-12-11 (×2): 5 mg via ORAL
  Filled 2020-12-09 (×2): qty 1

## 2020-12-09 MED ORDER — ALBUTEROL SULFATE (2.5 MG/3ML) 0.083% IN NEBU: 2.5000 mg | INHALATION_SOLUTION | Freq: Four times a day (QID) | RESPIRATORY_TRACT | Status: DC | PRN

## 2020-12-09 MED ORDER — APIXABAN 2.5 MG PO TABS
2.5000 mg | ORAL_TABLET | Freq: Two times a day (BID) | ORAL | Status: DC
  Administered 2020-12-09 – 2020-12-10 (×2): 2.5 mg via ORAL
  Filled 2020-12-09 (×2): qty 1

## 2020-12-09 MED ORDER — MELATONIN 3 MG PO TABS
3.0000 mg | ORAL_TABLET | Freq: Every day | ORAL | Status: DC
  Administered 2020-12-09 – 2020-12-10 (×2): 3 mg via ORAL
  Filled 2020-12-09 (×3): qty 1

## 2020-12-09 MED ORDER — PROMETHAZINE HCL 25 MG PO TABS: 25.0000 mg | ORAL_TABLET | Freq: Every day | ORAL | Status: DC | PRN

## 2020-12-09 NOTE — Telephone Encounter (Signed)
[facility] alert received for treated VT-2 event logged 28 seconds w/ rate 200's bpm; EGM suggest VT that received unsuccessful ATP x 2 followed by a single 15 J shock that terminated the arrhythmia.  Amiodarone 200 mg daily, Eliquis 5 mg BID, Toprol- XL 25 mg daily, torsemide 20 mg (as prescribed)  Patient reports he was standing in the kitchen, passed out and woke up in the floor. Patient reports he hit his head on the tile floor. No laceration noted to head per patient. States he felt fine prior and post shock. [facility] came out to the house completed EKG, checked vitals and advised he checked out well. Patient reports today he does have increased shortness of breath, generalized weakness and swelling in both legs. Wife states since yesterday post fall he has needed to use his walker. Reports compliance with all medications. Patient  was diagnosed with Covid 7 days ago. Spoke to wife and she is agreeable patient go to the ED for evaluation.    Shock plan reviewed with patient with verbal understanding. Also, advised per [place] DMV he is not advised to drive x 6 months with verbal understanding.  Advised I will forward to Dr. [name] and we will call with changes.

## 2020-12-09 NOTE — Procedures (Signed)
Echo attempted. Patient with lab. Will attempt again later.

## 2020-12-09 NOTE — Progress Notes (Signed)
Orthopedic Tech Progress Note Patient Details:  Corey Tyler 08/27/1941 [id number]  Ortho Devices Type of Ortho Device: CAM walker Ortho Device/Splint Location: Right Lower Extremity Ortho Device/Splint Interventions: Ordered,Application   Post Interventions Patient Tolerated: Well Instructions Provided: Poper ambulation with device,Care of device,Adjustment of device   [name] 12/09/2020, 4:38 PM

## 2020-12-09 NOTE — ED Provider Notes (Signed)
[facility] EMERGENCY DEPARTMENT Provider Note   CSN: [id number] Arrival date & time: 12/09/20  1116     History Chief Complaint  Patient presents with  . Loss of Consciousness    Corey Tyler is a 80 y.o. male.  HPI   80 year old male with a history of A. fib, AICD, CHF, COPD, hyperlipidemia, hypertension, who presents to the emergency department today for evaluation of syncope.  Patient states he was in his kitchen yesterday when he suddenly felt like he was lightheaded and everything was going black.  The next he knew he was on the floor and had had a syncopal episode.  He does report head trauma and has some pain to the back of his head.  He is anticoagulated.  He denies any neck pain but reports pain to the left knee and right foot.  He denies any preceding chest pain.  States he has chronic shortness of breath and is on 3 to 4 L at home but his shortness of breath is unchanged.  Denies any pleuritic pain.  He did not feel his AICD go off.  He was called by Dr. [name] today and told that his defibrillator went off yesterday and was advised to come here for further evaluation.  Of note, patient is currently COVID-positive.  He became symptomatic about 10 days ago and states he just has some congestion at this time and little bit of a cough.  He has had some nausea as well and some postnasal drip but no vomiting or diarrhea.  Past Medical History:  Diagnosis Date  . A-fib ([place])    a. Noted on 12/2012 interrogation, placed on Apixaban and ultimately stopped NOACs 2/2 personal decision 8/14.  Marland Kitchen AICD (automatic cardioverter/defibrillator) present    Cardiac defibrillator -dual  [employer]  . Arthritis   . Atrial tachycardia-non sustained    a. Noted on 03/2012 interrogation.  . Chronic systolic heart failure (HCC)    EF down to 20 to 25% per echo November 2012  . COPD (chronic obstructive pulmonary disease) ([place])   . Hyperlipidemia   . Hypertension   . ICD  (implantable cardiac defibrillator) in place   . NICM (nonischemic cardiomyopathy) ([place])    a. Normal cors 2000. b. Minimal plaque 2013.  . Old myocardial infarction    a. ?Silent MI. b. Large fixed inferolateral defect c/w with prior infarct, no ischemia. c. Cath in 2000/2013 with only minimal CAD.  Marland Kitchen Presence of permanent cardiac pacemaker   . Pulmonary nodule, right    Last scan in 2010 showing stability; felt to be benign.  Marland Kitchen PVC's (premature ventricular contractions)   . Ventricular tachycardia, polymorphic ([place])    Rx via ICD 12/14    Patient Active Problem List   Diagnosis Date Noted  . Syncope 12/09/2020  . Educated about COVID-19 virus infection 05/13/2020  . Orthostatic hypotension 12/29/2019  . NSVT (nonsustained ventricular tachycardia) ([place]) 12/29/2019  . Tremor 10/18/2019  . Hypomagnesemia 09/07/2019  . Hypokalemia 09/07/2019  . CKD (chronic kidney disease) stage 3, GFR 30-59 ml/min ([place]) 09/02/2019  . SBO (small bowel obstruction) ([place]) 08/31/2019  . Chronic respiratory failure with hypoxia ([place]) 08/31/2019  . Chronic anticoagulation 08/31/2019  . Hypothyroidism 08/31/2019  . Chronic nausea 08/31/2019  . Generalized weakness 07/29/2019  . Acute respiratory failure ([place]) 09/15/2017  . COPD exacerbation ([place]) 09/15/2017  . Sustained ventricular tachycardia ([place]) 06/10/2017  . VT (ventricular tachycardia) ([place]) 11/02/2016  . Benign fibroma of prostate 04/02/2015  .  Ventricular tachycardia, polymorphic ([place])   . Diverticulosis of colon without hemorrhage 09/25/2013  . Atrial fibrillation ([place]) 12/29/2012  . ICD (implantable cardioverter-defibrillator) in place   . Hyperlipidemia   . PVCs 11/26/2011  . Chronic systolic heart failure ([place]) 10/07/2011  . Cardiomyopathy, nonischemic and ischemic 09/24/2011  . 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 PULMONARY NODULE 12/04/2008    Past Surgical History:  Procedure Laterality  Date  . APPENDECTOMY    . CARDIAC CATHETERIZATION  2000  . CARDIAC DEFIBRILLATOR PLACEMENT    . CARDIOVERSION  03/30/2013   Procedure: CARDIOVERSION;  Surgeon: Thayer Headings, MD;  Location: [facility];  Service: Cardiovascular;;  . COLON SURGERY    . COLONOSCOPY N/A 09/25/2013   Procedure: COLONOSCOPY;  Surgeon: Jerene Bears, MD;  Location: [facility];  Service: Endoscopy;  Laterality: N/A;  . ICD  12/2011   [name]  . IMPLANTABLE CARDIOVERTER DEFIBRILLATOR IMPLANT N/A 01/06/2012   Procedure: IMPLANTABLE CARDIOVERTER DEFIBRILLATOR IMPLANT;  Surgeon: Deboraha Sprang, MD;  Location: [facility] C[facility] LAB;  Service: Cardiovascular;  Laterality: N/A;  . PACEMAKER IMPLANT     [name]  . TEE WITHOUT CARDIOVERSION N/A 03/30/2013   Procedure: TRANSESOPHAGEAL ECHOCARDIOGRAM (TEE);  Surgeon: Thayer Headings, MD;  Location: [facility];  Service: Cardiovascular;  Laterality: N/A;  . V TACH ABLATION  06/10/2017  . V TACH ABLATION N/A 06/10/2017   Procedure: V Tach Ablation;  Surgeon: [facility] Lance, MD;  Location: [facility] CV LAB;  Service: Cardiovascular;  Laterality: N/A;       Family History  Problem Relation Age of Onset  . Emphysema Mother   . Heart disease Mother        AVR  . Heart failure Father        Died agwe 105  . Esophageal cancer Brother   . Colon cancer Neg Hx     Social History   Tobacco Use  . Smoking status: Former Smoker    Packs/day: 2.50    Years: 40.00    Pack years: 100.00    Types: Cigarettes    Quit date: 11/24/1983    Years since quitting: 37.0  . Smokeless tobacco: Never Used  . Tobacco comment: 2 1/2 ppd x 40 years  Vaping Use  . Vaping Use: Never used  Substance Use Topics  . Alcohol use: No  . Drug use: No    Home Medications Prior to Admission medications   Medication Sig Start Date End Date Taking? Authorizing Provider  acetaminophen (TYLENOL) 500 MG tablet Take 500 mg by mouth every 6 (six) hours as needed for headache (pain).   Yes [provider]  albuterol (PROVENTIL) (2.5 MG/3ML) 0.083% nebulizer solution Take 2.5 mg by nebulization every 6 (six) hours as needed for wheezing or shortness of breath.   Yes [provider]  albuterol (VENTOLIN HFA) 108 (90 Base) MCG/ACT inhaler Inhale 2 puffs into the lungs every 6 (six) hours as needed for wheezing or shortness of breath.   Yes [provider]  amiodarone (PACERONE) 200 MG tablet Take 200 mg by mouth daily.   Yes [provider]  budesonide-formoterol (SYMBICORT) 160-4.5 MCG/ACT inhaler Inhale 2 puffs into the lungs 2 (two) times daily.   Yes [provider]  Carboxymethylcellul-Glycerin (LUBRICATING EYE DROPS OP) Place 1 drop into both eyes daily.   Yes [provider]  cholecalciferol (VITAMIN D) 1000 UNITS tablet Take 1,000 Units by mouth daily.   Yes  [provider]  ELIQUIS 5 MG TABS tablet TAKE 1 TABLET BY MOUTH TWICE A DAY Patient taking differently: Take 5 mg by mouth 2 (two) times daily. 07/30/15  Yes Minus Breeding, MD  finasteride (PROSCAR) 5 MG tablet Take 5 mg by mouth daily.    Yes [provider]  levothyroxine (SYNTHROID, LEVOTHROID) 125 MCG tablet Take 125 mcg by mouth daily before breakfast.  06/15/17  Yes [provider]  MAGNESIUM PO Take 420 mg by mouth at bedtime.   Yes [provider]  Melatonin 3 MG TABS Take 3 mg by mouth at bedtime.   Yes [provider]  metoprolol succinate (TOPROL XL) 25 MG 24 hr tablet TAKE 1/2 TABLET DAILY Patient taking differently: Take 25 mg by mouth daily. 08/13/20  Yes Minus Breeding, MD  Multiple Vitamins-Minerals (MULTIVITAMIN WITH MINERALS) tablet Take 1 tablet by mouth daily. Mens 50 plus   Yes [provider]  nitroGLYCERIN (NITROSTAT) 0.4 MG SL tablet Place 0.4 mg under the tongue every 5 (five) minutes as needed for chest pain.  11/12/11  Yes Burtis Junes, NP  Omega-3 Fatty Acids (FISH OIL) 1000 MG CPDR Take 1,000 mg  by mouth daily.   Yes [provider]  OXYGEN Inhale 3-4 L into the lungs See admin instructions. 3 L at rest and 4 L with exertion   Yes [provider]  potassium chloride (MICRO-K) 10 MEQ CR capsule Take 10 mEq by mouth at bedtime.   Yes [provider]  promethazine (PHENERGAN) 25 MG tablet Take 25 mg by mouth daily as needed for nausea or vomiting.  04/20/17  Yes [provider]  Psyllium (METAMUCIL PO) Take 15 mLs by mouth daily. Mix in liquid and drink   Yes [provider]  ranolazine (RANEXA) 1000 MG SR tablet Take 1 tablet (1,000 mg total) by mouth 2 (two) times daily. 09/23/18  Yes Deboraha Sprang, MD  sodium chloride (OCEAN) 0.65 % SOLN nasal spray Place 1 spray into both nostrils daily as needed for congestion.   Yes [provider]  tamsulosin (FLOMAX) 0.4 MG CAPS capsule Take 0.4 mg by mouth at bedtime.   Yes [provider]  Tiotropium Bromide Monohydrate (SPIRIVA RESPIMAT) 2.5 MCG/ACT AERS Inhale 2 puffs into the lungs daily.   Yes [provider]  torsemide (DEMADEX) 20 MG tablet Take 1 tablet by mouth daily and 1/2 tablet as needed Patient taking differently: Take 20-30 mg by mouth See admin instructions. Take one tablet (20 mg) by mouth daily, on Saturday, Sunday, Wednesday take an additional 1/2 tablet (10 mg) for a total dose of 30 mg on those days 05/14/20  Yes Minus Breeding, MD  azithromycin (ZITHROMAX) 250 MG tablet Take 250-500 mg by mouth See admin instructions. Take 2 tablets (500 mg) by mouth 1st day, then take 1 tablet (250 mg) daily on days 2-5 12/04/20   [provider]    Allergies    Atorvastatin, Statins, Xarelto [rivaroxaban], Adhesive [tape], Codeine, Isosorbide nitrate, Latex, Levofloxacin, Lisinopril, Lorazepam, Mexiletine, and Sertraline  Review of Systems   Review of Systems  Constitutional: Positive for fever.  HENT: Positive for congestion and postnasal drip.   Eyes: Negative  for visual disturbance.  Respiratory: Positive for cough and shortness of breath (chronic, unchanged).   Cardiovascular: Negative for chest pain.  Gastrointestinal: Positive for nausea. Negative for abdominal pain, constipation, diarrhea and vomiting.  Genitourinary: Negative for dysuria and hematuria.  Musculoskeletal: Negative for back pain and  neck pain.       Left knee pain, right foot pain  Skin: Negative for rash.  Neurological: Positive for syncope and light-headedness.       Head injury  All other systems reviewed and are negative.   Physical Exam Updated Vital Signs BP 123/70   Pulse (!) 59   Temp 98.3 F (36.8 C) (Oral)   Resp 17   SpO2 92%   Physical Exam Vitals and nursing note reviewed.  Constitutional:      Appearance: He is well-developed and well-nourished.  HENT:     Head: Normocephalic and atraumatic.  Eyes:     Extraocular Movements: Extraocular movements intact.     Conjunctiva/sclera: Conjunctivae normal.     Pupils: Pupils are equal, round, and reactive to light.  Cardiovascular:     Rate and Rhythm: Normal rate and regular rhythm.     Heart sounds: Normal heart sounds. No murmur heard.   Pulmonary:     Effort: Pulmonary effort is normal. No respiratory distress.     Breath sounds: Rales (bibasilar) present. No wheezing or rhonchi.  Abdominal:     General: Bowel sounds are normal.     Palpations: Abdomen is soft.     Tenderness: There is no abdominal tenderness. There is no guarding or rebound.  Musculoskeletal:        General: No edema.     Cervical back: Neck supple.     Comments: No TTP to the cervical, thoracic or lumbar spine. TTP noted to the left knee along the patella and medial joint line. TTP noted to the right foot with ecchymosis to the dorsum of the foot. Trace ble edema  Skin:    General: Skin is warm and dry.  Neurological:     Mental Status: He is alert.     Comments: Mental Status:  Alert, thought content appropriate, able to  give a coherent history. Speech fluent without evidence of aphasia. Able to follow 2 step commands without difficulty.  Cranial Nerves:  II:   pupils equal, round, reactive to light III,IV, VI: ptosis not present, extra-ocular motions intact bilaterally  V,VII: smile symmetric, facial light touch sensation equal VIII: hearing grossly normal to voice  X: uvula elevates symmetrically  XI: bilateral shoulder shrug symmetric and strong XII: midline tongue extension without fassiculations Motor:  Normal tone. 5/5 strength of BUE and BLE major muscle groups including strong and equal grip strength and dorsiflexion/plantar flexion Sensory: light touch normal in all extremities.   Psychiatric:        Mood and Affect: Mood and affect normal.     ED Results / Procedures / Treatments   Labs (all labs ordered are listed, but only abnormal results are displayed) Labs Reviewed  CBC WITH DIFFERENTIAL/PLATELET - Abnormal; Notable for the following components:      Result Value   RBC 3.66 (*)    Hemoglobin 11.9 (*)    HCT 36.3 (*)    Abs Immature Granulocytes 0.08 (*)    All other components within normal limits  BASIC METABOLIC PANEL - Abnormal; Notable for the following components:   Chloride 96 (*)    Glucose, Bld 112 (*)    BUN 24 (*)    Creatinine, Ser 1.68 (*)    Calcium 8.1 (*)    GFR, Estimated 41 (*)    All other components within normal limits  TROPONIN I (HIGH SENSITIVITY) - Abnormal; Notable for the following components:   Troponin I (High Sensitivity) 23 (*)  All other components within normal limits  MAGNESIUM  BRAIN NATRIURETIC PEPTIDE  TSH  TROPONIN I (HIGH SENSITIVITY)    EKG EKG Interpretation  Date/Time:  Monday December 09 2020 11:19:42 EST Ventricular Rate:  60 PR Interval:    QRS Duration: 126 QT Interval:  471 QTC Calculation: 471 R Axis:   26 Text Interpretation: Sinus rhythm Consider left atrial enlargement Nonspecific intraventricular conduction delay  Confirmed by Thamas Jaegers ([id number]) on 12/09/2020 11:21:54 AM   Radiology DG Chest 2 View  Result Date: 12/09/2020 CLINICAL DATA:  Syncope EXAM: CHEST - 2 VIEW COMPARISON:  December 29, 2019 FINDINGS: Ill-defined opacity in the left base is suspicious for pneumonia. There is underlying interstitial thickening throughout the lungs. Heart is upper normal in size with pulmonary vascularity normal. Pacemaker leads attached to right atrium and right ventricle. No adenopathy. There is aortic atherosclerosis. No bone lesions. IMPRESSION: Ill-defined opacity left base concerning for pneumonia. Underlying interstitial thickening likely represents a degree of chronic bronchitis. Heart upper normal in size with pacemaker leads attached to right atrium and right ventricle. Aortic Atherosclerosis (ICD10-I70.0). Electronically Signed   By: Lowella Grip III M.D.   On: 12/09/2020 15:02   CT Head Wo Contrast  Result Date: 12/09/2020 CLINICAL DATA:  Head trauma, minor (Age >= 65y) EXAM: CT HEAD WITHOUT CONTRAST TECHNIQUE: Contiguous axial images were obtained from the base of the skull through the vertex without intravenous contrast. COMPARISON:  07/29/2019 and prior. FINDINGS: Brain: No acute infarct or intracranial hemorrhage. No mass lesion. No midline shift, ventriculomegaly or extra-axial fluid collection. Mild cerebral volume loss with ex vacuo dilatation. Chronic microvascular ischemic changes. Vascular: No hyperdense vessel or unexpected calcification. Skull: Negative for fracture or focal lesion. Sinuses/Orbits: Sequela of bilateral lens replacement. Mild ethmoid and maxillary sinus mucosal thickening. No mastoid effusion. Other: None. IMPRESSION: No acute intracranial process. Mild cerebral atrophy and chronic microvascular ischemic changes. Electronically Signed   By: Primitivo Gauze M.D.   On: 12/09/2020 12:25   DG Knee Complete 4 Views Left  Result Date: 12/09/2020 CLINICAL DATA:  Left knee pain after  fall. EXAM: LEFT KNEE - COMPLETE 4+ VIEW COMPARISON:  None. FINDINGS: No evidence of fracture, dislocation, or joint effusion. No evidence of arthropathy or other focal bone abnormality. Soft tissues are unremarkable. IMPRESSION: Negative. Electronically Signed   By: Marijo Conception M.D.   On: 12/09/2020 12:47   DG Foot Complete Right  Result Date: 12/09/2020 CLINICAL DATA:  Right foot pain after fall. EXAM: RIGHT FOOT COMPLETE - 3+ VIEW COMPARISON:  Apr 09, 2016. FINDINGS: Mildly displaced fracture is seen involving the sesamoid bone adjacent to the distal portion of the first metatarsal. There is no evidence of arthropathy or other focal bone abnormality. Soft tissues are unremarkable. IMPRESSION: Mildly displaced sesamoid bone adjacent to distal portion of first metatarsal. Electronically Signed   By: Marijo Conception M.D.   On: 12/09/2020 12:47    Procedures Procedures (including critical care time)  Medications Ordered in ED Medications  amiodarone (PACERONE) tablet 200 mg (has no administration in time range)  nitroGLYCERIN (NITROSTAT) SL tablet 0.4 mg (has no administration in time range)  metoprolol tartrate (LOPRESSOR) tablet 12.5 mg (has no administration in time range)  ranolazine (RANEXA) 12 hr tablet 1,000 mg (has no administration in time range)  levothyroxine (SYNTHROID) tablet 125 mcg (has no administration in time range)  psyllium (HYDROCIL/METAMUCIL) 1 packet (has no administration in time range)  finasteride (PROSCAR) tablet 5 mg (has no administration  in time range)  tamsulosin (FLOMAX) capsule 0.4 mg (has no administration in time range)  apixaban (ELIQUIS) tablet 5 mg (has no administration in time range)  melatonin tablet 3 mg (has no administration in time range)  cholecalciferol (VITAMIN D3) tablet 1,000 Units (has no administration in time range)  magnesium oxide (MAG-OX) tablet 400 mg (has no administration in time range)  potassium chloride SA (KLOR-CON) CR tablet  40 mEq (has no administration in time range)  multivitamin with minerals tablet 1 tablet (has no administration in time range)  omega-3 acid ethyl esters (LOVAZA) capsule 1 g (has no administration in time range)  albuterol (PROVENTIL) (2.5 MG/3ML) 0.083% nebulizer solution 2.5 mg (has no administration in time range)  fluticasone furoate-vilanterol (BREO ELLIPTA) 200-25 MCG/INH 1 puff (has no administration in time range)  promethazine (PHENERGAN) tablet 25 mg (has no administration in time range)  sodium chloride (OCEAN) 0.65 % nasal spray 1 spray (has no administration in time range)  umeclidinium bromide (INCRUSE ELLIPTA) 62.5 MCG/INH 1 puff (has no administration in time range)  polyvinyl alcohol (LIQUIFILM TEARS) 1.4 % ophthalmic solution 1 drop (has no administration in time range)  sodium chloride flush (NS) 0.9 % injection 3 mL (has no administration in time range)  acetaminophen (TYLENOL) tablet 650 mg (has no administration in time range)    Or  acetaminophen (TYLENOL) suppository 650 mg (has no administration in time range)  acetaminophen (TYLENOL) tablet 1,000 mg (1,000 mg Oral Given 12/09/20 1239)    ED Course  I have reviewed the triage vital signs and the nursing notes.  Pertinent labs & imaging results that were available during my care of the patient were reviewed by me and considered in my medical decision making (see chart for details).    MDM Rules/Calculators/A&P                          80 y/o M presenting for eval of syncope. Had episode yesterday and was told by his provider his AICD fired and he had an episode of vtach.  Reviewed/interpreted labs CBC with mild anemia at baseline BMP with renal dysfunction that appears baseline, otherwise ressuring Mag wnl Trop marginally elevated   EKG Sinus rhythm Consider left atrial enlargement Nonspecific intraventricular conduction delay  Reviewed/interpreted imaging CT head - No acute intracranial process. Mild  cerebral atrophy and chronic microvascular ischemic changes. Xray left knee Negative. Xray right foot - Mildly displaced sesamoid bone adjacent to distal portion of first metatarsal.  1:42 PM CONSULT with cardiology. They will see and consult on patient but recommend admission with medicine service  2:30 PM CONSULT with Dr. Lucia Gaskins with cardiology who reviewed imaging and recommends short boot and outpatient f/u. Pt can bear weight on the foot.  2:37 PM CONSULT with Dr. Roosevelt Locks who accepts patient for admission.    Final Clinical Impression(s) / ED Diagnoses Final diagnoses:  Syncope, unspecified syncope type    Rx / DC Orders ED Discharge Orders    None       Bishop Dublin 12/09/20 1525    Luna Fuse, MD 12/09/20 1657

## 2020-12-09 NOTE — H&P (Signed)
History and Physical    Corey Tyler GXQ:[id number] DOB: Feb 02, 1941 DOA: 12/09/2020  PCP: [name], MD (Confirm with patient/family/NH records and if not entered, this has to be entered at [facility] point of entry) Patient coming from: Home  I have personally briefly reviewed patient's old medical records in [place]  Chief Complaint: Fainted and passed out.  HPI: Corey Tyler is a 80 y.o. male with medical history significant of chronic systolic CHF with LVEF 25 to 30%, status post AICD, chronic oxygen dependent 3 to 4 L around-the-clock, COPD, chronic A. fib on Eliquis, CKD stage II, presented with syncope.  Patient started to have sore throat, dry cough and shortness of breath 1 week ago, his wife was tested positive for COVID on 1/10, and he went to tested positive on 1/11.  PCP started him on 4 days course of Zithromax which he completed yesterday.  PCP decided not to start him on steroid due to his current cardiology condition.  Patient has reported his breathing status has somewhat improved and stabilized after the ABX Rx, but he still feel " chest congested, cannot take deep breath, and also has been having wheezing" wife also reported low-grade fever for the last 2 to 3 days.  Yesterday around lunchtime patient had a syncope event when he felt " suddenly blacked out" and fell down hit his head and knee.  Denies any other prodrome such as shortness of breath palpitations.  Cardiologist office recorded 20 seconds of V. tach's heart rate more than 200 and a 15 J AICD discharge.  Patient however did not feel any AICD discharge.  Patient woke up after 10 to 15 seconds, was slightly confused but did not complain about chest pain or more shortness of breath. Patient was COVID vaccinated x2. ED Course: Borderline bradycardia, borderline QTC prolongation.  Chest x-ray showed no significant infiltrates.  Stable on 4 L at his baseline.  Review of Systems: As per HPI otherwise 14 point review  of systems negative.    Past Medical History:  Diagnosis Date   A-fib [facility])    a. Noted on 12/2012 interrogation, placed on Apixaban and ultimately stopped NOACs 2/2 personal decision 8/14.   AICD (automatic cardioverter/defibrillator) present    Cardiac defibrillator -dual  [employer]   Arthritis    Atrial tachycardia-non sustained    a. Noted on 03/2012 interrogation.   Chronic systolic heart failure (HCC)    EF down to 20 to 25% per echo November 2012   COPD (chronic obstructive pulmonary disease) ([place])    Hyperlipidemia    Hypertension    ICD (implantable cardiac defibrillator) in place    NICM (nonischemic cardiomyopathy) ([place])    a. Normal cors 2000. b. Minimal plaque 2013.   Old myocardial infarction    a. ?Silent MI. b. Large fixed inferolateral defect c/w with prior infarct, no ischemia. c. Cath in 2000/2013 with only minimal CAD.   Presence of permanent cardiac pacemaker    Pulmonary nodule, right    Last scan in 2010 showing stability; felt to be benign.   PVC's (premature ventricular contractions)    Ventricular tachycardia, polymorphic ([place])    Rx via ICD 12/14    Past Surgical History:  Procedure Laterality Date   [place]  03/30/2013   Procedure: CARDIOVERSION;  Surgeon: Thayer Headings, MD;  Location: [place];  Service: Cardiovascular;;  COLON SURGERY     COLONOSCOPY N/A 09/25/2013   Procedure: COLONOSCOPY;  Surgeon: Jerene Bears, MD;  Location: [facility];  Service: Endoscopy;  Laterality: N/A;   ICD  12/2011   [name]   IMPLANTABLE CARDIOVERTER DEFIBRILLATOR IMPLANT N/A 01/06/2012   Procedure: IMPLANTABLE CARDIOVERTER DEFIBRILLATOR IMPLANT;  Surgeon: Deboraha Sprang, MD;  Location: [facility] CATH LAB;  Service: Cardiovascular;  Laterality: N/A;   PACEMAKER IMPLANT     [name]   TEE WITHOUT CARDIOVERSION N/A 03/30/2013   Procedure: TRANSESOPHAGEAL  ECHOCARDIOGRAM (TEE);  Surgeon: Thayer Headings, MD;  Location: [place];  Service: Cardiovascular;  Laterality: N/A;   V TACH ABLATION  06/10/2017   V TACH ABLATION N/A 06/10/2017   Procedure: Stephanie Coup Ablation;  Surgeon: Evans Lance, MD;  Location: [place] CV LAB;  Service: Cardiovascular;  Laterality: N/A;     reports that he quit smoking about 37 years ago. His smoking use included cigarettes. He has a 100.00 pack-year smoking history. He has never used smokeless tobacco. He reports that he does not drink alcohol and does not use drugs.  Allergies  Allergen Reactions   Atorvastatin Shortness Of Breath and Cough   Statins Shortness Of Breath and Other (See Comments)    Muscle and joing pain and "These bother my lungs and make my gait unsteady, also"   Xarelto [Rivaroxaban] Other (See Comments)    Bleeding   Adhesive [Tape] Hives, Itching and Rash   Codeine Nausea Only   Isosorbide Nitrate Other (See Comments)    Made him feel bad   Latex Itching and Other (See Comments)    Rash, itching, burning   Levofloxacin Other (See Comments)    Tachycardia   Lisinopril Cough   Lorazepam Other (See Comments)    "felt bad"   Mexiletine Other (See Comments)    GI distress   Sertraline Other (See Comments)    Made him feel bad    Family History  Problem Relation Age of Onset   Emphysema Mother    Heart disease Mother        AVR   Heart failure Father        Died agwe 27   Esophageal cancer Brother    Colon cancer Neg Hx      Prior to Admission medications   Medication Sig Start Date End Date Taking? Authorizing Provider  acetaminophen (TYLENOL) 500 MG tablet Take 500 mg by mouth every 6 (six) hours as needed for headache (pain).   Yes [provider]  albuterol (PROVENTIL) (2.5 MG/3ML) 0.083% nebulizer solution Take 2.5 mg by nebulization every 6 (six) hours as needed for wheezing or shortness of breath.   Yes [provider]   albuterol (VENTOLIN HFA) 108 (90 Base) MCG/ACT inhaler Inhale 2 puffs into the lungs every 6 (six) hours as needed for wheezing or shortness of breath.   Yes [provider]  amiodarone (PACERONE) 200 MG tablet Take 200 mg by mouth daily.   Yes [provider]  budesonide-formoterol (SYMBICORT) 160-4.5 MCG/ACT inhaler Inhale 2 puffs into the lungs 2 (two) times daily.   Yes [provider]  Carboxymethylcellul-Glycerin (LUBRICATING EYE DROPS OP) Place 1 drop into both eyes daily.   Yes [provider]  cholecalciferol (VITAMIN D) 1000 UNITS tablet Take 1,000 Units by mouth daily.   Yes [provider]  ELIQUIS 5 MG TABS tablet TAKE 1 TABLET BY MOUTH TWICE A DAY Patient taking differently: Take 5 mg by mouth  2 (two) times daily. 07/30/15  Yes Minus Breeding, MD  finasteride (PROSCAR) 5 MG tablet Take 5 mg by mouth daily.    Yes [provider]  levothyroxine (SYNTHROID, LEVOTHROID) 125 MCG tablet Take 125 mcg by mouth daily before breakfast.  06/15/17  Yes [provider]  MAGNESIUM PO Take 420 mg by mouth at bedtime.   Yes [provider]  Melatonin 3 MG TABS Take 3 mg by mouth at bedtime.   Yes [provider]  metoprolol succinate (TOPROL XL) 25 MG 24 hr tablet TAKE 1/2 TABLET DAILY Patient taking differently: Take 25 mg by mouth daily. 08/13/20  Yes Minus Breeding, MD  Multiple Vitamins-Minerals (MULTIVITAMIN WITH MINERALS) tablet Take 1 tablet by mouth daily. Mens 50 plus   Yes [provider]  nitroGLYCERIN (NITROSTAT) 0.4 MG SL tablet Place 0.4 mg under the tongue every 5 (five) minutes as needed for chest pain.  11/12/11  Yes Burtis Junes, NP  Omega-3 Fatty Acids (FISH OIL) 1000 MG CPDR Take 1,000 mg by mouth daily.   Yes [provider]  OXYGEN Inhale 3-4 L into the lungs See admin instructions. 3 L at rest and 4 L with exertion   Yes [provider]  potassium chloride (MICRO-K)  10 MEQ CR capsule Take 10 mEq by mouth at bedtime.   Yes [provider]  promethazine (PHENERGAN) 25 MG tablet Take 25 mg by mouth daily as needed for nausea or vomiting.  04/20/17  Yes [provider]  Psyllium (METAMUCIL PO) Take 15 mLs by mouth daily. Mix in liquid and drink   Yes [provider]  ranolazine (RANEXA) 1000 MG SR tablet Take 1 tablet (1,000 mg total) by mouth 2 (two) times daily. 09/23/18  Yes Deboraha Sprang, MD  sodium chloride (OCEAN) 0.65 % SOLN nasal spray Place 1 spray into both nostrils daily as needed for congestion.   Yes [provider]  tamsulosin (FLOMAX) 0.4 MG CAPS capsule Take 0.4 mg by mouth at bedtime.   Yes [provider]  Tiotropium Bromide Monohydrate (SPIRIVA RESPIMAT) 2.5 MCG/ACT AERS Inhale 2 puffs into the lungs daily.   Yes [provider]  torsemide (DEMADEX) 20 MG tablet Take 1 tablet by mouth daily and 1/2 tablet as needed Patient taking differently: Take 20-30 mg by mouth See admin instructions. Take one tablet (20 mg) by mouth daily, on Saturday, Sunday, Wednesday take an additional 1/2 tablet (10 mg) for a total dose of 30 mg on those days 05/14/20  Yes Minus Breeding, MD  azithromycin (ZITHROMAX) 250 MG tablet Take 250-500 mg by mouth See admin instructions. Take 2 tablets (500 mg) by mouth 1st day, then take 1 tablet (250 mg) daily on days 2-5 12/04/20   [provider]    Physical Exam: Vitals:   12/09/20 1128 12/09/20 1240 12/09/20 1345 12/09/20 1400  BP: (!) 119/58 115/66 116/67 123/70  Pulse: 63 60 60 (!) 59  Resp: 19 18 12 17   Temp: 98.3 F (36.8 C)     TempSrc: Oral     SpO2: 94% 93% 93% 92%    Constitutional: NAD, calm, comfortable Vitals:   12/09/20 1128 12/09/20 1240 12/09/20 1345 12/09/20 1400  BP: (!) 119/58 115/66 116/67 123/70  Pulse: 63 60 60 (!) 59  Resp: 19 18 12 17   Temp: 98.3 F (36.8 C)     TempSrc: Oral     SpO2: 94% 93% 93% 92%   Eyes: PERRL, lids  and  conjunctivae normal ENMT: Mucous membranes are moist. Posterior pharynx clear of any exudate or lesions.Normal dentition.  Neck: normal, supple, no masses, no thyromegaly Respiratory: Diminished breathing bilaterally, scattered wheezing but no crackles. Normal respiratory effort. No accessory muscle use.  Cardiovascular: Regular rate and rhythm, no murmurs / rubs / gallops. No extremity edema. 2+ pedal pulses. No carotid bruits.  Abdomen: no tenderness, no masses palpated. No hepatosplenomegaly. Bowel sounds positive.  Musculoskeletal: no clubbing / cyanosis. No joint deformity upper and lower extremities. Good ROM, no contractures. Normal muscle tone.  Skin: no rashes, lesions, ulcers. No induration Neurologic: CN 2-12 grossly intact. Sensation intact, DTR normal. Strength 5/5 in all 4.  Psychiatric: Normal judgment and insight. Alert and oriented x 3. Normal mood.    Labs on Admission: I have personally reviewed following labs and imaging studies  CBC: Recent Labs  Lab 12/09/20 1132  WBC 5.8  NEUTROABS 3.9  HGB 11.9*  HCT 36.3*  MCV 99.2  PLT 151   Basic Metabolic Panel: Recent Labs  Lab 12/09/20 1132  NA 136  K 3.6  CL 96*  CO2 28  GLUCOSE 112*  BUN 24*  CREATININE 1.68*  CALCIUM 8.1*  MG 2.0   GFR: CrCl cannot be calculated (Unknown ideal weight.). Liver Function Tests: No results for input(s): AST, ALT, ALKPHOS, BILITOT, PROT, ALBUMIN in the last 168 hours. No results for input(s): LIPASE, AMYLASE in the last 168 hours. No results for input(s): AMMONIA in the last 168 hours. Coagulation Profile: No results for input(s): INR, PROTIME in the last 168 hours. Cardiac Enzymes: No results for input(s): CKTOTAL, CKMB, CKMBINDEX, TROPONINI in the last 168 hours. BNP (last 3 results) No results for input(s): PROBNP in the last 8760 hours. HbA1C: No results for input(s): HGBA1C in the last 72 hours. CBG: No results for input(s): GLUCAP in the last 168  hours. Lipid Profile: No results for input(s): CHOL, HDL, LDLCALC, TRIG, CHOLHDL, LDLDIRECT in the last 72 hours. Thyroid Function Tests: No results for input(s): TSH, T4TOTAL, FREET4, T3FREE, THYROIDAB in the last 72 hours. Anemia Panel: No results for input(s): VITAMINB12, FOLATE, FERRITIN, TIBC, IRON, RETICCTPCT in the last 72 hours. Urine analysis:    Component Value Date/Time   COLORURINE YELLOW 12/29/2019 [place] 12/29/2019 1458   LABSPEC 1.008 12/29/2019 1458   PHURINE 6.0 12/29/2019 1458   GLUCOSEU NEGATIVE 12/29/2019 1458   HGBUR NEGATIVE 12/29/2019 1458   BILIRUBINUR NEGATIVE 12/29/2019 1458   KETONESUR NEGATIVE 12/29/2019 1458   PROTEINUR NEGATIVE 12/29/2019 1458   UROBILINOGEN 0.2 07/18/2015 1154   NITRITE NEGATIVE 12/29/2019 1458   LEUKOCYTESUR NEGATIVE 12/29/2019 1458    Radiological Exams on Admission: CT Head Wo Contrast  Result Date: 12/09/2020 CLINICAL DATA:  Head trauma, minor (Age >= 65y) EXAM: CT HEAD WITHOUT CONTRAST TECHNIQUE: Contiguous axial images were obtained from the base of the skull through the vertex without intravenous contrast. COMPARISON:  07/29/2019 and prior. FINDINGS: Brain: No acute infarct or intracranial hemorrhage. No mass lesion. No midline shift, ventriculomegaly or extra-axial fluid collection. Mild cerebral volume loss with ex vacuo dilatation. Chronic microvascular ischemic changes. Vascular: No hyperdense vessel or unexpected calcification. Skull: Negative for fracture or focal lesion. Sinuses/Orbits: Sequela of bilateral lens replacement. Mild ethmoid and maxillary sinus mucosal thickening. No mastoid effusion. Other: None. IMPRESSION: No acute intracranial process. Mild cerebral atrophy and chronic microvascular ischemic changes. Electronically Signed   By: Primitivo Gauze M.D.   On: 12/09/2020 12:25   DG Knee Complete 4  Views Left  Result Date: 12/09/2020 CLINICAL DATA:  Left knee pain after fall. EXAM: LEFT KNEE  - COMPLETE 4+ VIEW COMPARISON:  None. FINDINGS: No evidence of fracture, dislocation, or joint effusion. No evidence of arthropathy or other focal bone abnormality. Soft tissues are unremarkable. IMPRESSION: Negative. Electronically Signed   By: Marijo Conception M.D.   On: 12/09/2020 12:47   DG Foot Complete Right  Result Date: 12/09/2020 CLINICAL DATA:  Right foot pain after fall. EXAM: RIGHT FOOT COMPLETE - 3+ VIEW COMPARISON:  Apr 09, 2016. FINDINGS: Mildly displaced fracture is seen involving the sesamoid bone adjacent to the distal portion of the first metatarsal. There is no evidence of arthropathy or other focal bone abnormality. Soft tissues are unremarkable. IMPRESSION: Mildly displaced sesamoid bone adjacent to distal portion of first metatarsal. Electronically Signed   By: Marijo Conception M.D.   On: 12/09/2020 12:47    EKG: Independently reviewed.  Sinus, borderline bradycardia  Assessment/Plan Active Problems:   Syncope  (please populate well all problems here in Problem List. (For example, if patient is on BP meds at home and you resume or decide to hold them, it is a problem that needs to be her. Same for CAD, COPD, HLD and so on)   Syncope -Suspect went records recent [place].  Given the patient was on Zithromax for the last 5 days, looks like he always has some underlying condition in addition to his chronic systolic CHF and AICD, at least last month's EKG and today's showed borderline QTC prologation, raised the concern about temporary ventricle arrhythmia. Pt has completed Zithromax yesterday. -Xray to check further evidence of COVID PNA -Cardiology will see pt -Echo  Nonsustained V. Tach -Should avoid QTC prolongation meds including Zithromax in the future -K>4 and Mag>2  Acute COPD exacerbation -P.o. steroid -Continue home breathing regimen  COVID infection -Doubt patient has COVID-pneumonia, reviewed patient chest x-ray did not show significant left-sided infiltrates  compared to his previous x-ray.  And his breathing status including oxygen demand will remain at baseline expect his breathing effort which combined with his wheezing can be attributed to COPD exacerbation and will treat with short course p.o. steroids. -Low grade fever, will check COVID markers  Chronic systolic CHF -No symptoms signs of acute exacerbation  CKD stage II -Cut down Eliquis dosage  DVT prophylaxis: Eliquis  code Status: Full code Family Communication: Wife at bedside Disposition Plan: Expect less than 2 midnight hospital stay. Consults called: Cardiology Admission status: Telemetry observation   Lequita Halt MD [facility] Pager [phone]  12/09/2020, 2:53 PM

## 2020-12-09 NOTE — ED Notes (Signed)
Patient transported to CT 

## 2020-12-09 NOTE — Progress Notes (Signed)
Orthopedic Tech Progress Note Patient Details:  NISHAWN ROTAN August 18, 1941 [id number] Made an attempt to apply CAM walker to patient but the patient was not in the room at the time of my arrival  Patient ID: AYDRIAN HALPIN, male   DOB: 1941-03-09, 80 y.o.   MRN: [id number]   [name] 12/09/2020, 2:58 PM

## 2020-12-09 NOTE — ED Triage Notes (Signed)
Pt BIB [facility] from home c/o a syncopal episode yesterday. Pt states he had a syncopal episode standing at the kitchen sink yesterday. The room got black and pt stated he tried to lean over and next thing he knew he was in the floor. Pt states he did hit his head and is c/o of left knee pain and right foot pain. Pt was not going to get checked up but is followed by Dr. Jens Som and was called this morning by his office asking him to come here. Pt states he was told his defibrillator showed he went into V-tach and was shocked.

## 2020-12-09 NOTE — Consult Note (Signed)
Cardiology Consultation:   Patient ID: Corey Tyler MRN: [id number]; DOB: Jul 15, 1941  Admit date: 12/09/2020 Date of Consult: 12/09/2020  Primary Care Provider: [name], MD [facility] Cardiologist: Minus Breeding, MD  [facility] Electrophysiologist:  Virl Axe, MD    Patient Profile:   Corey Tyler is a 80 y.o. male with a history of non-ischemic cardiomyopathy with chronic systolic CHF and EF of [id number] on Echo in 07/2019 s/p ICD (placed in 2013 for primary prevention), recurrent episodes of VT on Amiodarone, atrial fibrillation on Eliquis, AAA, COPD on 4-5L of O2 at home, hypertension, hyperlipidemia, hypothyroidism, CKD stage III who is being seen today for the evaluation of syncope at the request of Dr. Almyra Free.  History of Present Illness:   Corey Tyler is a 80 year old male with the above history who is followed by Dr. Percival Spanish and Dr. [name]. Patient has long history of non-ischemic cardiomyopathy. He has had abnormal Myoview in the past as well as regional wall motion abnormalities noted on prior Echos; however, he has never had any significant CAD seen on prior cardiac catheterizations. Remote cardiac catheterization in 10/1999 showed normal coronaries with a small isolated akinetic side in the mid inferior LV wall. Most recent cardiac catheterization in 11/2011 showed minimal CAD. He had ICD placed in 2013 for primary prevention of sudden cardiac death given non-ischemic cardiomyopathy. He had an appropriate ICD shock for polymorphic VT 10/2013. Coreg was increased at that time. He has had recurrent episodes of VT since that time require initiation/uptitration of Amiodarone and device reprogramming. He underwent VT ablation in 05/2017. He had recurrent VT in 09/2017. Therefore, Ranexa was added to Amiodarone. Guideline direct medical therapy has been limited over the last couple of years due to problems with hypotension and orthostatic hypotension. He has chronic dyspnea and has  known COPD and is on 4L of O2 during the day and 5L at night.   Patient was last seen by Dr. Percival Spanish in 07/2020 at which time he noted chronic dyspnea and weakness as well as some dizzy spells with standing that souned consistent with orthostasis. BNP was checked and was only minimally elevated at 145. It was felt that his underlying pulmonary issues were the main driver of his dyspnea. Dr. [name] on 10/25/2020 at which time he continued to described dyspnea, weakness, and dizziness. There seemed to be some confusion about the main etiology of his dyspnea. Dr. Gwenette Greet (his Pulmnologist) reportedly felt like it was mostly cardiac in nature. However, prior chest CT have mentioned emphysema and most recent chest x-ray in 12/2019 mentions scarring in both upper lungs.Therefore, from our point of view it seems to be primarily a pulmonary issue. His ICD was approaching ERI and ICD generator change was scheduled for early 12/04/2020. Unfortunately, patient pre-procedural COVID test came back positive so procedure was cancelled.  Patient presented to the ED today for further evaluation of VT/syncope. Our office received notification today that patient had episode of VT yesterday that lasted 28 seconds with rate in the 200 bpms. EGM suggest VT that received unsuccessful ATP x2 followed by a single 15 J shock that terminated the arrhyhtmia. Patient was contacted today and reported he had a syncopal episode in the kitchen and woke up in the floor during this event yesterday. He was advised to go to the ED.  Patient reports yesterday he was in the kitchen and could feel himself "blacking out" so he went over and leaned over the kitchen sink. He then woke  up on the floor. He does not think he was unconscious for long. He denies any other symptoms before syncopal episode. No palpitations, dizziness, chest pain, or significant shortness of breath. He report chronic shortness that has been a little worse since he test positive  from COVID but not much. He describes "chest congestion" with COVID and states he has had a productive cough (coughing up clear/brown phlegm) but thinks that this is getting better. He has had some post-nasal drip when laying flat at night but no orthopnea or PND. He has some swelling of his left knee and right foot that has been bothering him since his fall but no other significant lower extremity swelling. No fevers. He notes some nausea and possibly some vomiting from all his post-nasal drip but no abdominal pain. No abnormal bleeding in urine or stools. He reports compliance with his cardiac medications.  In the ED, vitals stable. EKG showed normal sinus rhythm, rate 60 bpm, with no acute ST/T changes. WBC 5.8, Hgb 11.9, Plts 164. Na 136, K 3.6, Glucose 112, BUN 24, Cr 1.68. Mg 2.0. High-sensitivity troponin minimally elevated at 23. BNP normal. Head CT showed no acute intracranial process. Right foot x-ray showed mildly displaced sesamoid bone adjacent to distal portion of 1st metatarsal. Left knee x-ray was unremarkable.  At the time of this evaluation, patient states he is really feeling OK.   Past Medical History:  Diagnosis Date  . A-fib ([place])    a. Noted on 12/2012 interrogation, placed on Apixaban and ultimately stopped NOACs 2/2 personal decision 8/14.  Marland Kitchen AICD (automatic cardioverter/defibrillator) present    Cardiac defibrillator -dual  [employer]  . Arthritis   . Atrial tachycardia-non sustained    a. Noted on 03/2012 interrogation.  . Chronic systolic heart failure (HCC)    EF down to 20 to 25% per echo November 2012  . COPD (chronic obstructive pulmonary disease) ([place])   . Hyperlipidemia   . Hypertension   . ICD (implantable cardiac defibrillator) in place   . NICM (nonischemic cardiomyopathy) ([place])    a. Normal cors 2000. b. Minimal plaque 2013.  . Old myocardial infarction    a. ?Silent MI. b. Large fixed inferolateral defect c/w with prior infarct, no ischemia. c. Cath in  2000/2013 with only minimal CAD.  Marland Kitchen Presence of permanent cardiac pacemaker   . Pulmonary nodule, right    Last scan in 2010 showing stability; felt to be benign.  Marland Kitchen PVC's (premature ventricular contractions)   . Ventricular tachycardia, polymorphic ([place])    Rx via ICD 12/14    Past Surgical History:  Procedure Laterality Date  . APPENDECTOMY    . CARDIAC CATHETERIZATION  2000  . CARDIAC DEFIBRILLATOR PLACEMENT    . CARDIOVERSION  03/30/2013   Procedure: CARDIOVERSION;  Surgeon: Thayer Headings, MD;  Location: [facility];  Service: Cardiovascular;;  . COLON SURGERY    . COLONOSCOPY N/A 09/25/2013   Procedure: COLONOSCOPY;  Surgeon: Jerene Bears, MD;  Location: [facility];  Service: Endoscopy;  Laterality: N/A;  . ICD  12/2011   [name]  . IMPLANTABLE CARDIOVERTER DEFIBRILLATOR IMPLANT N/A 01/06/2012   Procedure: IMPLANTABLE CARDIOVERTER DEFIBRILLATOR IMPLANT;  Surgeon: Deboraha Sprang, MD;  Location: [facility] CATH LAB;  Service: Cardiovascular;  Laterality: N/A;  . PACEMAKER IMPLANT     [employer] [name]  . TEE WITHOUT CARDIOVERSION N/A 03/30/2013   Procedure: TRANSESOPHAGEAL ECHOCARDIOGRAM (TEE);  Surgeon: Thayer Headings, MD;  Location: [place];  Service: Cardiovascular;  Laterality: N/A;  .  V TACH ABLATION  06/10/2017  . V TACH ABLATION N/A 06/10/2017   Procedure: V Tach Ablation;  Surgeon: Evans Lance, MD;  Location: [facility] CV LAB;  Service: Cardiovascular;  Laterality: N/A;     Home Medications:  Prior to Admission medications   Medication Sig Start Date End Date Taking? Authorizing Provider  acetaminophen (TYLENOL) 500 MG tablet Take 500 mg by mouth every 6 (six) hours as needed for headache (pain).   Yes [provider]  albuterol (PROVENTIL) (2.5 MG/3ML) 0.083% nebulizer solution Take 2.5 mg by nebulization every 6 (six) hours as needed for wheezing or shortness of breath.   Yes [provider]  albuterol (VENTOLIN HFA) 108 (90 Base) MCG/ACT inhaler Inhale 2  puffs into the lungs every 6 (six) hours as needed for wheezing or shortness of breath.   Yes [provider]  amiodarone (PACERONE) 200 MG tablet Take 200 mg by mouth daily.   Yes [provider]  budesonide-formoterol (SYMBICORT) 160-4.5 MCG/ACT inhaler Inhale 2 puffs into the lungs 2 (two) times daily.   Yes [provider]  Carboxymethylcellul-Glycerin (LUBRICATING EYE DROPS OP) Place 1 drop into both eyes daily.   Yes [provider]  cholecalciferol (VITAMIN D) 1000 UNITS tablet Take 1,000 Units by mouth daily.   Yes [provider]  ELIQUIS 5 MG TABS tablet TAKE 1 TABLET BY MOUTH TWICE A DAY Patient taking differently: Take 5 mg by mouth 2 (two) times daily. 07/30/15  Yes Minus Breeding, MD  finasteride (PROSCAR) 5 MG tablet Take 5 mg by mouth daily.    Yes [provider]  levothyroxine (SYNTHROID, LEVOTHROID) 125 MCG tablet Take 125 mcg by mouth daily before breakfast.  06/15/17  Yes [provider]  MAGNESIUM PO Take 420 mg by mouth at bedtime.   Yes [provider]  Melatonin 3 MG TABS Take 3 mg by mouth at bedtime.   Yes [provider]  metoprolol succinate (TOPROL XL) 25 MG 24 hr tablet TAKE 1/2 TABLET DAILY Patient taking differently: Take 25 mg by mouth daily. 08/13/20  Yes Minus Breeding, MD  Multiple Vitamins-Minerals (MULTIVITAMIN WITH MINERALS) tablet Take 1 tablet by mouth daily. Mens 50 plus   Yes [provider]  nitroGLYCERIN (NITROSTAT) 0.4 MG SL tablet Place 0.4 mg under the tongue every 5 (five) minutes as needed for chest pain.  11/12/11  Yes Burtis Junes, NP  Omega-3 Fatty Acids (FISH OIL) 1000 MG CPDR Take 1,000 mg by mouth daily.   Yes [provider]  OXYGEN Inhale 3-4 L into the lungs See admin instructions. 3 L at rest and 4 L with exertion   Yes [provider]  potassium chloride (MICRO-K) 10 MEQ CR capsule Take 10 mEq by mouth at bedtime.   Yes [provider]  promethazine (PHENERGAN) 25 MG tablet Take 25 mg by mouth daily as needed for nausea or vomiting.  04/20/17  Yes [provider]  Psyllium (METAMUCIL PO) Take 15 mLs by mouth daily. Mix in liquid and drink   Yes [provider]  ranolazine (RANEXA) 1000 MG SR tablet Take 1 tablet (1,000 mg total) by mouth 2 (two) times daily. 09/23/18  Yes Deboraha Sprang, MD  sodium chloride (OCEAN) 0.65 % SOLN nasal spray Place 1 spray into both nostrils daily as needed for congestion.   Yes [provider]  tamsulosin (FLOMAX) 0.4 MG CAPS capsule Take 0.4 mg by mouth at bedtime.   Yes [provider]  Tiotropium Bromide Monohydrate (SPIRIVA RESPIMAT) 2.5 MCG/ACT AERS Inhale 2 puffs into the lungs daily.   Yes [provider]  torsemide (DEMADEX) 20 MG tablet Take 1 tablet by mouth daily and 1/2 tablet as needed Patient taking differently: Take 20-30 mg by mouth See admin instructions. Take one tablet (20 mg) by mouth daily, on Saturday, Sunday, Wednesday take an additional 1/2 tablet (10 mg) for a total dose of 30 mg on those days 05/14/20  Yes Minus Breeding, MD  azithromycin (ZITHROMAX) 250 MG tablet Take 250-500 mg by mouth See admin instructions. Take 2 tablets (500 mg) by mouth 1st day, then take 1 tablet (250 mg) daily on days 2-5 12/04/20   [provider]    Inpatient Medications: Scheduled Meds:  Continuous Infusions:  PRN Meds:   Allergies:    Allergies  Allergen Reactions  . Atorvastatin Shortness Of Breath and Cough  . Statins Shortness Of Breath and Other (See Comments)    Muscle and joing pain and "These bother my lungs and make my gait unsteady, also"  . Xarelto [Rivaroxaban] Other (See Comments)    Bleeding  . Adhesive [Tape] Hives, Itching and Rash  . Codeine Nausea Only  . Isosorbide Nitrate Other (See Comments)    Made him feel bad  . Latex Itching and Other (See Comments)    Rash, itching, burning  .  Levofloxacin Other (See Comments)    Tachycardia  . Lisinopril Cough  . Lorazepam Other (See Comments)    "felt bad"  . Mexiletine Other (See Comments)    GI distress  . Sertraline Other (See Comments)    Made him feel bad    Social History:   Social History   Socioeconomic History  . Marital status: Married    Spouse name: Not on file  . Number of children: 2  . Years of education: Not on file  . Highest education level: Not on file  Occupational History  . Occupation: Retired    Comment: Press photographer  . Occupation: Works    Fish farm manager: [employer]  Tobacco Use  . Smoking status: Former Smoker    Packs/day: 2.50    Years: 40.00    Pack years: 100.00    Types: Cigarettes    Quit date: 11/24/1983    Years since quitting: 37.0  . Smokeless tobacco: Never Used  . Tobacco comment: 2 1/2 ppd x 40 years  Vaping Use  . Vaping Use: Never used  Substance and Sexual Activity  . Alcohol use: No  . Drug use: No  . Sexual activity: Yes    Birth control/protection: None  Other Topics Concern  . Not on file  Social History Narrative   Still works at [employer] part-time   Lives with Wife in [place] Strain: Not on file  Food Insecurity: Not on file  Transportation Needs: Not on file  Physical Activity: Not on file  Stress: Not on file  Social Connections: Not on file  Intimate Partner Violence: Not on file    Family History:    Family History  Problem Relation Age of Onset  . Emphysema Mother   . Heart disease Mother        AVR  . Heart failure Father        Died agwe 31  . Esophageal cancer Brother   . Colon cancer Neg Hx      ROS:  Please see the  history of present illness.  All other ROS reviewed and negative.     Physical Exam/Data:   Vitals:   12/09/20 1128 12/09/20 1240 12/09/20 1345 12/09/20 1400  BP: (!) 119/58 115/66 116/67 123/70  Pulse: 63 60 60 (!) 59  Resp: 19 18 12 17   Temp: 98.3 F  (36.8 C)     TempSrc: Oral     SpO2: 94% 93% 93% 92%   No intake or output data in the 24 hours ending 12/09/20 1537 Last 3 Weights 10/25/2020 08/12/2020 05/14/2020  Weight (lbs) 211 lb 12.8 oz 211 lb 210 lb 9.6 oz  Weight (kg) 96.072 kg 95.709 kg 95.528 kg     There is no height or weight on file to calculate BMI.  General:  Well nourished, well developed, in no acute distress HEENT: normal Lymph: no adenopathy Neck: no JVD Endocrine:  No thryomegaly Vascular: No carotid bruits; FA pulses 2+ bilaterally without bruits  Cardiac:  normal S1, S2; RRR; faint 1/6 systolic murmur.  No S3 gallop Lungs: No wheezing, slightly decreased breath sounds left base Abd: soft, nontender, no hepatomegaly  Ext: no edema Musculoskeletal:  No deformities, BUE and BLE strength normal and equal Skin: warm and dry  Neuro:  CNs 2-12 intact, no focal abnormalities noted Psych:  Normal affect   EKG:  The EKG was personally reviewed and demonstrates: Normal sinus rhythm, rate 60 bpm, with no acute ST/T changes.  Telemetry:  Telemetry was personally reviewed and demonstrates: Sinus rhythm in the 60s with rare isolated PVC  Relevant CV Studies:  Echocardiogram 08/18/2019: Impressions: 1. Left ventricular ejection fraction, by visual estimation, is 25 to  30%. The left ventricle has severely decreased function. Mildly increased  left ventricular size. There is mildly increased left ventricular  hypertrophy.  2. Left ventricular diastolic Doppler parameters are consistent with  impaired relaxation pattern of LV diastolic filling.  3. Global right ventricle has normal systolic function.The right  ventricular size is mildly enlarged.  4. Left atrial size was mildly dilated.  5. Right atrial size was normal.  6. The mitral valve is normal in structure. Mild mitral valve  regurgitation. No evidence of mitral stenosis.  7. The tricuspid valve is normal in structure. Tricuspid valve  regurgitation is  mild.  8. The aortic valve is tricuspid Aortic valve regurgitation is mild by  color flow Doppler. Mild aortic valve sclerosis without stenosis.  9. The pulmonic valve was grossly normal. Pulmonic valve regurgitation is  trivial by color flow Doppler.  10. Aortic dilatation noted.  11. There is mild dilatation of the aortic root measuring 39 mm.  12. Mildly elevated pulmonary artery systolic pressure.  13. A pacer wire is visualized.  14. The inferior vena cava is normal in size with greater than 50%  respiratory variability, suggesting right atrial pressure of 3 mmHg.  15. Severe LV systolic dysfunction; grade 1 diastolic dysfunction; mild  LVH and LVE; mild AI, MR and TR; mild LAE and RVE; mildly elevated  pulmonary pressure.  Laboratory Data:  High Sensitivity Troponin:   Recent Labs  Lab 12/09/20 1132  TROPONINIHS 23*     Chemistry Recent Labs  Lab 12/09/20 1132  NA 136  K 3.6  CL 96*  CO2 28  GLUCOSE 112*  BUN 24*  CREATININE 1.68*  CALCIUM 8.1*  GFRNONAA 41*  ANIONGAP 12    No results for input(s): PROT, ALBUMIN, AST, ALT, ALKPHOS, BILITOT in the last 168 hours. Hematology Recent Labs  Lab 12/09/20  1132  WBC 5.8  RBC 3.66*  HGB 11.9*  HCT 36.3*  MCV 99.2  MCH 32.5  MCHC 32.8  RDW 14.6  PLT 164   BNP Recent Labs  Lab 12/09/20 1141  BNP 81.8    DDimer No results for input(s): DDIMER in the last 168 hours.   Radiology/Studies:  DG Chest 2 View  Result Date: 12/09/2020 CLINICAL DATA:  Syncope EXAM: CHEST - 2 VIEW COMPARISON:  December 29, 2019 FINDINGS: Ill-defined opacity in the left base is suspicious for pneumonia. There is underlying interstitial thickening throughout the lungs. Heart is upper normal in size with pulmonary vascularity normal. Pacemaker leads attached to right atrium and right ventricle. No adenopathy. There is aortic atherosclerosis. No bone lesions. IMPRESSION: Ill-defined opacity left base concerning for pneumonia. Underlying  interstitial thickening likely represents a degree of chronic bronchitis. Heart upper normal in size with pacemaker leads attached to right atrium and right ventricle. Aortic Atherosclerosis (ICD10-I70.0). Electronically Signed   By: Lowella Grip III M.D.   On: 12/09/2020 15:02   CT Head Wo Contrast  Result Date: 12/09/2020 CLINICAL DATA:  Head trauma, minor (Age >= 65y) EXAM: CT HEAD WITHOUT CONTRAST TECHNIQUE: Contiguous axial images were obtained from the base of the skull through the vertex without intravenous contrast. COMPARISON:  07/29/2019 and prior. FINDINGS: Brain: No acute infarct or intracranial hemorrhage. No mass lesion. No midline shift, ventriculomegaly or extra-axial fluid collection. Mild cerebral volume loss with ex vacuo dilatation. Chronic microvascular ischemic changes. Vascular: No hyperdense vessel or unexpected calcification. Skull: Negative for fracture or focal lesion. Sinuses/Orbits: Sequela of bilateral lens replacement. Mild ethmoid and maxillary sinus mucosal thickening. No mastoid effusion. Other: None. IMPRESSION: No acute intracranial process. Mild cerebral atrophy and chronic microvascular ischemic changes. Electronically Signed   By: Primitivo Gauze M.D.   On: 12/09/2020 12:25   DG Knee Complete 4 Views Left  Result Date: 12/09/2020 CLINICAL DATA:  Left knee pain after fall. EXAM: LEFT KNEE - COMPLETE 4+ VIEW COMPARISON:  None. FINDINGS: No evidence of fracture, dislocation, or joint effusion. No evidence of arthropathy or other focal bone abnormality. Soft tissues are unremarkable. IMPRESSION: Negative. Electronically Signed   By: Marijo Conception M.D.   On: 12/09/2020 12:47   DG Foot Complete Right  Result Date: 12/09/2020 CLINICAL DATA:  Right foot pain after fall. EXAM: RIGHT FOOT COMPLETE - 3+ VIEW COMPARISON:  Apr 09, 2016. FINDINGS: Mildly displaced fracture is seen involving the sesamoid bone adjacent to the distal portion of the first metatarsal. There  is no evidence of arthropathy or other focal bone abnormality. Soft tissues are unremarkable. IMPRESSION: Mildly displaced sesamoid bone adjacent to distal portion of first metatarsal. Electronically Signed   By: Marijo Conception M.D.   On: 12/09/2020 12:47     Assessment and Plan:   Syncope - Secondary to VT. See below. - EKG shows no acute ishcemic changes.  - High-sensitivity troponin minimally elevated at 23. Repeat pending. Do not suspect ACS. Had ICD shock yesterday.  - Will update Echo.  - Continue to monitor on telemetry.   VT - Patient has history of VT. S/p VT ablation in 2018. Per device report, patient had 28 second of VT yesterday with rates in the 200's bpm. EGM suggest VT that received unsuccessful ATP x2 followed by a single 15 J shock that terminated the arrhyhtmia. - Potassium 3.6. Keep > 3.9. - Magnesium 2.0. Keep > 1.9. - Will update Echo.  - Continue Toprol-XL 25mg   daily. - Continue Amiodarone 200mg  daily.  - Continue Ranexa 1000mg  twice daily.  - Will need EP consult.  Chronic Systolic CHF/Non-Ischemic Cardiomyopathy  - Patient has chronic dyspnea with underlying COPD. Not significantly worse from baseline. - Most recent Echo from 07/2019 showed LVEF of 25-30% with grade 1 diastolic dysfunction, mild MR, mild AI, mildly elevated PASP. - BNP normal. - Continue home Torsemide.  - Continue Toprol as above. - No ACEI/ARB/ARNI or MRA due to history of orthostatic hypotension. - Continue to monitor volume status closely.   New York Heart Association (NYHA) Functional Class NYHA Class II-III. Patient has chronic dyspnea from COPD and is on home O2.  S/p ICD - Patient was scheduled for generator change on 12/04/2020. However, pre-procedural COVID test positive so this was cancelled. - Will need EP consult.  Paroxysmal Atrial Fibrillation - Currently in sinus rhtythm. - Continue Toprol-XL as above.  - Continue Eliquis. On 5mg  twice daily at home. But we may need to  transition to reduced dose. Patient is 80 years old and creatinine ranges from 1.4 to 1.7. Currently 1.68.  CHA2DS2-VASc Score = 5  This indicates a 7.2% annual risk of stroke. The patient's score is based upon: CHF History: Yes HTN History: Yes Diabetes History: No Stroke History: No Vascular Disease History: Yes Age Score: 2 Gender Score: 0  Hypertension - History of hypertensions but has had problems with orthostatic hypotension over the last few years.  - Continue above medications.   Otherwise, per primary team. - COVID infection - COPD on chronic O2 - Right foot fracture     For questions or updates, please contact [facility] Please consult [email or web address] for contact info under    Signed, Darreld Mclean, PA-C  12/09/2020 3:37 PM    Patient seen and examined. Agree with assessment and plan.  Corey Tyler is a very pleasant 80 year old gentleman who is followed by Drs. Hochrein and [name].  He has a history of a nonischemic cardiomyopathy and has not been found to have significant coronary obstructive disease on prior catheterizations.  In 2013 he underwent insertion of an ICD for primary prevention of sudden cardiac death given his cardiomyopathy.  Shortly thereafter he had an appropriate ICD shock for polymorphic VT.  He has had recurrent episodes of VT requiring up titration of amiodarone as well as device programming and in July 2018 underwent VT ablation by Dr. Lovena Le.  He has a remote tobacco history and quit smoking many years ago.  He has been followed by Dr. Normajean Baxter for his pulmonology with previous scarring in both upper lungs on x-ray last year.  His defibrillator was approaching ERI and he was scheduled to undergo an ICD generator change last week on December 04, 2020.  Preoperative COVID testing however was positive the patient states that his wife had also had a positive COVID test.  His COVID symptoms included sore throat, coughing, mild increased chest congestion,  loss of taste and loss of smell.  He denies any significant fevers.  Yesterday he was at home in his kitchen.  He was standing up apparently he had an episode where he felt dizzy.  Symptoms continued and ultimately was defibrillated and fell to the floor.  Apparently his foot got stuck and twisted.  He was contacted by Dr. Olin Pia office today and was told of having 28-second episode of ventricular tachycardia with failed ATP x2 followed by a single 15 J shock that terminated his arrhythmia.  Since his defibrillation yesterday, he is  unaware of any further arrhythmia but he was told to come to the emergency room for further evaluation and treatment.  Presently, he is in sinus rhythm with an occasional isolated PVC.  He has been on amiodarone 200 mg as well as metoprolol succinate 25 mg.  He has been anticoagulated with a history of PAF on Eliquis 5 mg twice a day and has been on Symbicort 2 puffs twice a day.  Of note he has been on Ranexa 1000 mg twice a day and torsemide for swelling.  He had recently also been started on azithromycin.  Presently he is and oriented in no distress.  There is no JVD.  Breath sounds are slightly decreased in the left base but there is no audible wheezing.  Rhythm is regular with a 1/6 systolic murmur.  Abdomen is soft nontender without hepatosplenomegaly.  There is no significant edema.  Neurologic exam is grossly nonfocal.  Laboratory is notable for 3.6, creatinine increased to 1.68, magnesium 2.0, BNP is normal at 81.8, high-sensitivity troponin is only minimally increased at 23 not significant for ACS.  COVID-19 test was +6 days ago on December 03, 2020.  The patient will be admitted by the hospitalist.  Chest X-ray demonstrates an ill-defined opacity in the left base concerning for possible pneumonia.  There is degree of chronic bronchitis.  There is evidence for aortic atherosclerosis.  No acute changes were noted on CT of his head.  Right foot x-ray demonstrates mildly displaced  sesamoid bone adjacent to the distal portion of the first metatarsal.  Presently, he is maintaining sinus rhythm.  We will plan to repeat 2D echo Doppler study for reassessment of LV systolic and diastolic function.  Due to the interaction of ranolazine with amiodarone which is a moderate CYP3a inhibitor will reduce ranolazine dose to the recommended maximum dose with concomitant amiodarone to 500 mg twice daily due to documented interaction.  With his age of 61 years and creatinine of 1.6, will decrease Eliquis to 2.5 mg twice daily.  Present we will not add any additional antiarrhythmic therapy but will notify EP of admission who will assume his rounding care tomorrow.  Patient will need a ICD generator change out since it is approaching ERI.  At present with current telemetry showing sinus rhythm in the 60s, continue current dose of metoprolol succinate.  We will continue home dose of torsemide.  K replete to 4.0.   Troy Sine, MD, [facility] 12/09/2020 3:59 PM

## 2020-12-10 ENCOUNTER — Other Ambulatory Visit: Payer: Self-pay | Admitting: Physician Assistant

## 2020-12-10 ENCOUNTER — Observation Stay (HOSPITAL_COMMUNITY): Payer: No Typology Code available for payment source

## 2020-12-10 ENCOUNTER — Encounter (HOSPITAL_COMMUNITY): Payer: Self-pay | Admitting: Internal Medicine

## 2020-12-10 DIAGNOSIS — Z825 Family history of asthma and other chronic lower respiratory diseases: Secondary | ICD-10-CM | POA: Diagnosis not present

## 2020-12-10 DIAGNOSIS — Z950 Presence of cardiac pacemaker: Secondary | ICD-10-CM | POA: Diagnosis not present

## 2020-12-10 DIAGNOSIS — N182 Chronic kidney disease, stage 2 (mild): Secondary | ICD-10-CM | POA: Diagnosis present

## 2020-12-10 DIAGNOSIS — I340 Nonrheumatic mitral (valve) insufficiency: Secondary | ICD-10-CM

## 2020-12-10 DIAGNOSIS — G319 Degenerative disease of nervous system, unspecified: Secondary | ICD-10-CM | POA: Diagnosis not present

## 2020-12-10 DIAGNOSIS — S92313A Displaced fracture of first metatarsal bone, unspecified foot, initial encounter for closed fracture: Secondary | ICD-10-CM | POA: Diagnosis present

## 2020-12-10 DIAGNOSIS — R55 Syncope and collapse: Secondary | ICD-10-CM

## 2020-12-10 DIAGNOSIS — G9389 Other specified disorders of brain: Secondary | ICD-10-CM | POA: Diagnosis not present

## 2020-12-10 DIAGNOSIS — W1830XA Fall on same level, unspecified, initial encounter: Secondary | ICD-10-CM | POA: Diagnosis present

## 2020-12-10 DIAGNOSIS — I482 Chronic atrial fibrillation, unspecified: Secondary | ICD-10-CM | POA: Diagnosis present

## 2020-12-10 DIAGNOSIS — E785 Hyperlipidemia, unspecified: Secondary | ICD-10-CM | POA: Diagnosis present

## 2020-12-10 DIAGNOSIS — I351 Nonrheumatic aortic (valve) insufficiency: Secondary | ICD-10-CM

## 2020-12-10 DIAGNOSIS — I361 Nonrheumatic tricuspid (valve) insufficiency: Secondary | ICD-10-CM | POA: Diagnosis not present

## 2020-12-10 DIAGNOSIS — Z87891 Personal history of nicotine dependence: Secondary | ICD-10-CM | POA: Diagnosis not present

## 2020-12-10 DIAGNOSIS — J441 Chronic obstructive pulmonary disease with (acute) exacerbation: Secondary | ICD-10-CM | POA: Diagnosis present

## 2020-12-10 DIAGNOSIS — Y92000 Kitchen of unspecified non-institutional (private) residence as  the place of occurrence of the external cause: Secondary | ICD-10-CM | POA: Diagnosis not present

## 2020-12-10 DIAGNOSIS — I480 Paroxysmal atrial fibrillation: Secondary | ICD-10-CM | POA: Diagnosis present

## 2020-12-10 DIAGNOSIS — I472 Ventricular tachycardia: Secondary | ICD-10-CM

## 2020-12-10 DIAGNOSIS — S92311A Displaced fracture of first metatarsal bone, right foot, initial encounter for closed fracture: Secondary | ICD-10-CM | POA: Diagnosis not present

## 2020-12-10 DIAGNOSIS — I4891 Unspecified atrial fibrillation: Secondary | ICD-10-CM

## 2020-12-10 DIAGNOSIS — U071 COVID-19: Secondary | ICD-10-CM | POA: Diagnosis present

## 2020-12-10 DIAGNOSIS — I5022 Chronic systolic (congestive) heart failure: Secondary | ICD-10-CM | POA: Diagnosis present

## 2020-12-10 DIAGNOSIS — M25562 Pain in left knee: Secondary | ICD-10-CM | POA: Diagnosis not present

## 2020-12-10 DIAGNOSIS — I493 Ventricular premature depolarization: Secondary | ICD-10-CM | POA: Diagnosis present

## 2020-12-10 DIAGNOSIS — I130 Hypertensive heart and chronic kidney disease with heart failure and stage 1 through stage 4 chronic kidney disease, or unspecified chronic kidney disease: Secondary | ICD-10-CM | POA: Diagnosis present

## 2020-12-10 DIAGNOSIS — S0990XA Unspecified injury of head, initial encounter: Secondary | ICD-10-CM | POA: Diagnosis not present

## 2020-12-10 DIAGNOSIS — I428 Other cardiomyopathies: Secondary | ICD-10-CM | POA: Diagnosis present

## 2020-12-10 DIAGNOSIS — M1990 Unspecified osteoarthritis, unspecified site: Secondary | ICD-10-CM | POA: Diagnosis present

## 2020-12-10 DIAGNOSIS — N400 Enlarged prostate without lower urinary tract symptoms: Secondary | ICD-10-CM | POA: Diagnosis present

## 2020-12-10 DIAGNOSIS — Z8249 Family history of ischemic heart disease and other diseases of the circulatory system: Secondary | ICD-10-CM | POA: Diagnosis not present

## 2020-12-10 DIAGNOSIS — Z9981 Dependence on supplemental oxygen: Secondary | ICD-10-CM | POA: Diagnosis not present

## 2020-12-10 DIAGNOSIS — I2510 Atherosclerotic heart disease of native coronary artery without angina pectoris: Secondary | ICD-10-CM | POA: Diagnosis present

## 2020-12-10 DIAGNOSIS — I252 Old myocardial infarction: Secondary | ICD-10-CM | POA: Diagnosis not present

## 2020-12-10 DIAGNOSIS — J9601 Acute respiratory failure with hypoxia: Secondary | ICD-10-CM | POA: Diagnosis present

## 2020-12-10 DIAGNOSIS — I6782 Cerebral ischemia: Secondary | ICD-10-CM | POA: Diagnosis not present

## 2020-12-10 DIAGNOSIS — Z95810 Presence of automatic (implantable) cardiac defibrillator: Secondary | ICD-10-CM | POA: Diagnosis not present

## 2020-12-10 DIAGNOSIS — I313 Pericardial effusion (noninflammatory): Secondary | ICD-10-CM | POA: Diagnosis present

## 2020-12-10 LAB — COMPREHENSIVE METABOLIC PANEL
ALT: 63 U/L — ABNORMAL HIGH (ref 0–44)
AST: 45 U/L — ABNORMAL HIGH (ref 15–41)
Albumin: 2.7 g/dL — ABNORMAL LOW (ref 3.5–5.0)
Alkaline Phosphatase: 48 U/L (ref 38–126)
Anion gap: 10 (ref 5–15)
BUN: 22 mg/dL (ref 8–23)
CO2: 25 mmol/L (ref 22–32)
Calcium: 8.5 mg/dL — ABNORMAL LOW (ref 8.9–10.3)
Chloride: 98 mmol/L (ref 98–111)
Creatinine, Ser: 1.2 mg/dL (ref 0.61–1.24)
GFR, Estimated: >60 mL/min (ref >60)
Glucose, Bld: 129 mg/dL — ABNORMAL HIGH (ref 70–99)
Potassium: 4.3 mmol/L (ref 3.5–5.1)
Sodium: 133 mmol/L — ABNORMAL LOW (ref 135–145)
Total Bilirubin: 0.8 mg/dL (ref 0.3–1.2)
Total Protein: 5.5 g/dL — ABNORMAL LOW (ref 6.5–8.1)

## 2020-12-10 LAB — CBC WITH DIFFERENTIAL/PLATELET
Abs Immature Granulocytes: 0.03 10*3/uL (ref 0.00–0.07)
Basophils Absolute: 0 10*3/uL (ref 0.0–0.1)
Basophils Relative: 0 %
Eosinophils Absolute: 0 10*3/uL (ref 0.0–0.5)
Eosinophils Relative: 0 %
HCT: 37.5 % — ABNORMAL LOW (ref 39.0–52.0)
Hemoglobin: 12.4 g/dL — ABNORMAL LOW (ref 13.0–17.0)
Immature Granulocytes: 1 %
Lymphocytes Relative: 19 %
Lymphs Abs: 0.7 10*3/uL (ref 0.7–4.0)
MCH: 32.5 pg (ref 26.0–34.0)
MCHC: 33.1 g/dL (ref 30.0–36.0)
MCV: 98.4 fL (ref 80.0–100.0)
Monocytes Absolute: 0.2 10*3/uL (ref 0.1–1.0)
Monocytes Relative: 6 %
Neutro Abs: 2.6 10*3/uL (ref 1.7–7.7)
Neutrophils Relative %: 74 %
Platelets: 159 10*3/uL (ref 150–400)
RBC: 3.81 MIL/uL — ABNORMAL LOW (ref 4.22–5.81)
RDW: 14.1 % (ref 11.5–15.5)
WBC: 3.5 10*3/uL — ABNORMAL LOW (ref 4.0–10.5)
nRBC: 0 % (ref 0.0–0.2)

## 2020-12-10 LAB — ECHOCARDIOGRAM COMPLETE
Area-P 1/2: 4.89 cm2
Calc EF: 43.4 %
Height: 73 in
MV M vel: 5.46 m/s
MV Peak grad: 119.2 mmHg
P 1/2 time: 592 msec
Radius: 0.5 cm
S' Lateral: 4.8 cm
Single Plane A2C EF: 36.6 %
Single Plane A4C EF: 49 %
Weight: 3200 oz

## 2020-12-10 LAB — CBG MONITORING, ED
Glucose-Capillary: 121 mg/dL — ABNORMAL HIGH (ref 70–99)
Glucose-Capillary: 137 mg/dL — ABNORMAL HIGH (ref 70–99)

## 2020-12-10 LAB — BRAIN NATRIURETIC PEPTIDE: B Natriuretic Peptide: 146.8 pg/mL — ABNORMAL HIGH (ref 0.0–100.0)

## 2020-12-10 LAB — PROCALCITONIN: Procalcitonin: <0.1 ng/mL

## 2020-12-10 LAB — FERRITIN: Ferritin: 212 ng/mL (ref 24–336)

## 2020-12-10 LAB — PHOSPHORUS: Phosphorus: 3.4 mg/dL (ref 2.5–4.6)

## 2020-12-10 LAB — GLUCOSE, CAPILLARY
Glucose-Capillary: 130 mg/dL — ABNORMAL HIGH (ref 70–99)
Glucose-Capillary: 162 mg/dL — ABNORMAL HIGH (ref 70–99)

## 2020-12-10 LAB — MAGNESIUM: Magnesium: 2.2 mg/dL (ref 1.7–2.4)

## 2020-12-10 LAB — C-REACTIVE PROTEIN: CRP: 14.7 mg/dL — ABNORMAL HIGH (ref <1.0)

## 2020-12-10 LAB — D-DIMER, QUANTITATIVE: D-Dimer, Quant: 0.27 ug/mL-FEU (ref 0.00–0.50)

## 2020-12-10 MED ORDER — AMIODARONE HCL 200 MG PO TABS
200.0000 mg | ORAL_TABLET | Freq: Two times a day (BID) | ORAL | Status: DC
  Administered 2020-12-10 – 2020-12-11 (×2): 200 mg via ORAL
  Filled 2020-12-10 (×2): qty 1

## 2020-12-10 MED ORDER — SODIUM CHLORIDE 0.9 % IV SOLN
200.0000 mg | Freq: Once | INTRAVENOUS | Status: AC
  Administered 2020-12-10: 200 mg via INTRAVENOUS
  Filled 2020-12-10: qty 40

## 2020-12-10 MED ORDER — APIXABAN 5 MG PO TABS
5.0000 mg | ORAL_TABLET | Freq: Two times a day (BID) | ORAL | Status: DC
  Administered 2020-12-10 – 2020-12-11 (×2): 5 mg via ORAL
  Filled 2020-12-10 (×2): qty 1

## 2020-12-10 MED ORDER — SODIUM CHLORIDE 0.9 % IV SOLN
100.0000 mg | Freq: Every day | INTRAVENOUS | Status: DC
  Administered 2020-12-11: 100 mg via INTRAVENOUS
  Filled 2020-12-10: qty 100

## 2020-12-10 NOTE — ED Notes (Signed)
Report given to [facility]

## 2020-12-10 NOTE — Progress Notes (Signed)
PROGRESS NOTE                                                                                                                                                                                                             Patient Demographics:    Corey Tyler, is a 80 y.o. male, DOB - [birth year], [id number]  Outpatient Primary MD for the patient is [name], MD    LOS - 0  Admit date - 12/09/2020    Chief Complaint  Patient presents with  . Loss of Consciousness       Brief Narrative (HPI from H&P) - Corey Tyler is a 80 y.o. male with medical history significant of chronic systolic CHF with LVEF 25 to 30%, status post AICD, chronic oxygen dependent 3 to 4 L around-the-clock, COPD, chronic A. fib on Eliquis, CKD stage II, presented with syncope with AICD firing, according to the wife patient does run low blood pressure from time to time at home and gets lightheaded but has never passed out before.  He was also having a low-grade fever and cough at home for the last few days, he has taken 2 COVID-vaccine shots but not the booster.  In the ER he was diagnosed with COVID-19 infection, admitted for treatment of COVID-19 and syncope evaluation.   Subjective:    Campbell Riches today has, No headache, No chest pain, No abdominal pain - No Nausea, No new weakness tingling or numbness, mild Cough - no SOB.   Assessment  & Plan :     1. Chronic Hypoxic Resp. Failure due to CHF/COPD on 3-5 lit o2 at home - with early Acute Covid 19 infection, have subjective fevers and mild cough few days prior to ER visit and syncope.  Inflammatory markers especially CRP quite elevated, chest x-ray inconclusive, at this time he will be placed on low-dose steroids and Remdesivir with close monitoring.  Encouraged the patient to sit up in chair in the daytime use I-S and flutter valve for pulmonary toiletry and then prone in bed when at night.  Will  advance activity and titrate down oxygen as possible.  SpO2: 93 % O2 Flow Rate (L/min): 5 L/min  Recent Labs  Lab 12/03/20 1305 12/09/20 1132 12/09/20 1141 12/09/20 1645 12/10/20 0602  WBC  --  5.8  --   --  3.5*  HGB  --  11.9*  --   --  12.4*  HCT  --  36.3*  --   --  37.5*  PLT  --  164  --   --  159  CRP  --   --   --  15.2* 14.7*  BNP  --   --  81.8 93.6  --   DDIMER  --   --   --  0.39 0.27  PROCALCITON  --   --   --  <0.10  --   AST  --   --   --   --  45*  ALT  --   --   --   --  63*  ALKPHOS  --   --   --   --  48  BILITOT  --   --   --   --  0.8  ALBUMIN  --   --   --   --  2.7*  SARSCOV2NAA POSITIVE*  --   --   --   --     2. Syncope with possible NSVT and AICD discharge in a patient with chronic systolic heart failure EF around 25%.  Cardiology and EP following, electrolytes stable, recent exposure to azithromycin question if this could have precipitated it.  Continue beta-blocker and amiodarone.  Advance activity, PT OT, follow-up EP recommendations.  3. Underlying COPD with chronic hypoxic respiratory failure.  Supportive care.   4.  Chronic systolic heart failure EF 25% cardiomyopathy.  Supportive care.  5.  Paroxysmal atrial fibrillation [place] vas 2 score of 5.  Continue beta-blocker and Eliquis.  6. Fall with fracture of right first metatarsal.  In a splint/boot.  PT OT and outpatient PCP and Ortho follow-up if needed.  7.  BPH.  On Flomax continue.       Condition - Fair  Family Communication  : Wife Hassan Rowan [phone] on 12/10/2020  Code Status :  Full  Consults  :  Cards, EP  Procedures  :    CT head - Non acute  PUD Prophylaxis :   Disposition Plan  :    Status is: Observation   Dispo: The patient is from: Home              Anticipated d/c is to: Home              Anticipated d/c date is: 2 days              Patient currently is not medically stable to d/c.   DVT Prophylaxis  :  Eliquis  Lab Results  Component Value Date   PLT  159 12/10/2020    Diet :  Diet Order            Diet Heart Room service appropriate? Yes; Fluid consistency: Thin; Fluid restriction: 1800 mL Fluid  Diet effective now                  Inpatient Medications  Scheduled Meds: . amiodarone  200 mg Oral Daily  . apixaban  2.5 mg Oral BID  . cholecalciferol  1,000 Units Oral Daily  . dexamethasone  6 mg Oral Daily  . finasteride  5 mg Oral Daily  . fluticasone furoate-vilanterol  1 puff Inhalation Daily  . insulin aspart  0-9 Units Subcutaneous TID WC  . Ipratropium-Albuterol  1 puff Inhalation Q6H  . levothyroxine  125 mcg Oral QAC breakfast  . magnesium oxide  400 mg  Oral QHS  . melatonin  3 mg Oral QHS  . metoprolol tartrate  12.5 mg Oral BID  . multivitamin with minerals  1 tablet Oral Daily  . omega-3 acid ethyl esters  1 g Oral Daily  . polyvinyl alcohol  1 drop Both Eyes Daily  . psyllium  1 packet Oral Daily  . ranolazine  500 mg Oral BID  . sodium chloride flush  3 mL Intravenous Q12H  . tamsulosin  0.4 mg Oral QHS  . umeclidinium bromide  1 puff Inhalation Daily   Continuous Infusions: PRN Meds:.acetaminophen **OR** [DISCONTINUED] acetaminophen, guaiFENesin-dextromethorphan, nitroGLYCERIN, promethazine, sodium chloride  Antibiotics  :    Anti-infectives (From admission, onward)   None       Time Spent in minutes  30   Lala Lund M.D on 12/10/2020 at 10:55 AM  To page go to [email or web address]   [facility] -  Office  [phone]    See all Orders from today for further details    Objective:   Vitals:   12/10/20 0600 12/10/20 0800 12/10/20 0914 12/10/20 1000  BP: (!) 142/81 (!) 141/76 122/72 125/71  Pulse: (!) 59 63 65 66  Resp: 14 17 15 19   Temp:      TempSrc:      SpO2: 90% 90% (!) 88% 93%  Weight:   90.7 kg   Height:   6\' 1"  (1.854 m)     Wt Readings from Last 3 Encounters:  12/10/20 90.7 kg  10/25/20 96.1 kg  08/12/20 95.7 kg     Intake/Output Summary (Last 24 hours) at  12/10/2020 1055 Last data filed at 12/09/2020 2100 Gross per 24 hour  Intake -  Output 200 ml  Net -200 ml     Physical Exam  Awake Alert, No new F.N deficits, Normal affect [place].AT,PERRAL Supple Neck,No JVD, No cervical lymphadenopathy appriciated.  Symmetrical Chest wall movement, Good air movement bilaterally, CTAB RRR,No Gallops,Rubs or new Murmurs, No Parasternal Heave +ve B.Sounds, Abd Soft, No tenderness, No organomegaly appriciated, No rebound - guarding or rigidity. No Cyanosis, R foot in a ankle splint/boot    Data Review:    CBC Recent Labs  Lab 12/09/20 1132 12/10/20 0602  WBC 5.8 3.5*  HGB 11.9* 12.4*  HCT 36.3* 37.5*  PLT 164 159  MCV 99.2 98.4  MCH 32.5 32.5  MCHC 32.8 33.1  RDW 14.6 14.1  LYMPHSABS 1.1 0.7  MONOABS 0.8 0.2  EOSABS 0.0 0.0  BASOSABS 0.0 0.0    Recent Labs  Lab 12/09/20 1132 12/09/20 1141 12/09/20 1645 12/10/20 0602  NA 136  --   --  133*  K 3.6  --   --  4.3  CL 96*  --   --  98  CO2 28  --   --  25  GLUCOSE 112*  --   --  129*  BUN 24*  --   --  22  CREATININE 1.68*  --   --  1.20  CALCIUM 8.1*  --   --  8.5*  AST  --   --   --  45*  ALT  --   --   --  63*  ALKPHOS  --   --   --  48  BILITOT  --   --   --  0.8  ALBUMIN  --   --   --  2.7*  MG 2.0  --   --  2.2  CRP  --   --  15.2*  14.7*  DDIMER  --   --  0.39 0.27  PROCALCITON  --   --  <0.10  --   TSH  --   --  0.833  --   BNP  --  81.8 93.6  --     ------------------------------------------------------------------------------------------------------------------ No results for input(s): CHOL, HDL, LDLCALC, TRIG, CHOLHDL, LDLDIRECT in the last 72 hours.  Lab Results  Component Value Date   HGBA1C 6.0 (H) 03/27/2013   ------------------------------------------------------------------------------------------------------------------ Recent Labs    12/09/20 1645  TSH 0.833    Cardiac Enzymes No results for input(s): CKMB, TROPONINI, MYOGLOBIN in the last 168  hours.  Invalid input(s): CK ------------------------------------------------------------------------------------------------------------------    Component Value Date/Time   BNP 93.6 12/09/2020 1645   BNP 52.8 12/25/2014 1343    Micro Results Recent Results (from the past 240 hour(s))  SARS CORONAVIRUS 2 (TAT 6-24 HRS) Nasopharyngeal Nasopharyngeal Swab     Status: Abnormal   Collection Time: 12/03/20  1:05 PM   Specimen: Nasopharyngeal Swab  Result Value Ref Range Status   SARS Coronavirus 2 POSITIVE (A) NEGATIVE Final    Comment: (NOTE) SARS-CoV-2 target nucleic acids are DETECTED.  The SARS-CoV-2 RNA is generally detectable in upper and lower respiratory specimens during the acute phase of infection. Positive results are indicative of the presence of SARS-CoV-2 RNA. Clinical correlation with patient history and other diagnostic information is  necessary to determine patient infection status. Positive results do not rule out bacterial infection or co-infection with other viruses.  The expected result is Negative.  Fact Sheet for Patients: SugarRoll.be  Fact Sheet for Healthcare Providers: https://www.woods-mathews.com/  This test is not yet approved or cleared by the [place] FDA and  has been authorized for detection and/or diagnosis of SARS-CoV-2 by FDA under an Emergency Use Authorization (EUA). This EUA will remain  in effect (meaning this test can be used) for the duration of the COVID-19 declaration under Section 564(b)(1) of the Act, 21 U. S.C. section 360bbb-3(b)(1), unless the authorization is terminated or revoked sooner.   Performed at [facility], [place] [address], [place], [place] [address]     Radiology Reports DG Chest 2 View  Result Date: 12/09/2020 CLINICAL DATA:  Syncope EXAM: CHEST - 2 VIEW COMPARISON:  December 29, 2019 FINDINGS: Ill-defined opacity in the left base is suspicious for pneumonia.  There is underlying interstitial thickening throughout the lungs. Heart is upper normal in size with pulmonary vascularity normal. Pacemaker leads attached to right atrium and right ventricle. No adenopathy. There is aortic atherosclerosis. No bone lesions. IMPRESSION: Ill-defined opacity left base concerning for pneumonia. Underlying interstitial thickening likely represents a degree of chronic bronchitis. Heart upper normal in size with pacemaker leads attached to right atrium and right ventricle. Aortic Atherosclerosis (ICD10-I70.0). Electronically Signed   By: Lowella Grip III M.D.   On: 12/09/2020 15:02   CT Head Wo Contrast  Result Date: 12/09/2020 CLINICAL DATA:  Head trauma, minor (Age >= 65y) EXAM: CT HEAD WITHOUT CONTRAST TECHNIQUE: Contiguous axial images were obtained from the base of the skull through the vertex without intravenous contrast. COMPARISON:  07/29/2019 and prior. FINDINGS: Brain: No acute infarct or intracranial hemorrhage. No mass lesion. No midline shift, ventriculomegaly or extra-axial fluid collection. Mild cerebral volume loss with ex vacuo dilatation. Chronic microvascular ischemic changes. Vascular: No hyperdense vessel or unexpected calcification. Skull: Negative for fracture or focal lesion. Sinuses/Orbits: Sequela of bilateral lens replacement. Mild ethmoid and maxillary sinus mucosal thickening. No mastoid effusion. Other: None.  IMPRESSION: No acute intracranial process. Mild cerebral atrophy and chronic microvascular ischemic changes. Electronically Signed   By: Primitivo Gauze M.D.   On: 12/09/2020 12:25   DG Knee Complete 4 Views Left  Result Date: 12/09/2020 CLINICAL DATA:  Left knee pain after fall. EXAM: LEFT KNEE - COMPLETE 4+ VIEW COMPARISON:  None. FINDINGS: No evidence of fracture, dislocation, or joint effusion. No evidence of arthropathy or other focal bone abnormality. Soft tissues are unremarkable. IMPRESSION: Negative. Electronically Signed   By:  Marijo Conception M.D.   On: 12/09/2020 12:47   DG Foot Complete Right  Result Date: 12/09/2020 CLINICAL DATA:  Right foot pain after fall. EXAM: RIGHT FOOT COMPLETE - 3+ VIEW COMPARISON:  Apr 09, 2016. FINDINGS: Mildly displaced fracture is seen involving the sesamoid bone adjacent to the distal portion of the first metatarsal. There is no evidence of arthropathy or other focal bone abnormality. Soft tissues are unremarkable. IMPRESSION: Mildly displaced sesamoid bone adjacent to distal portion of first metatarsal. Electronically Signed   By: Marijo Conception M.D.   On: 12/09/2020 12:47   CUP PACEART REMOTE DEVICE CHECK  Result Date: 11/19/2020 Scheduled remote reviewed. Normal device function.  Battery at [facility], triggered 11/11/20 (known, reviewed as alert notification). Generator replacement scheduled 12/04/20. OAC- Eliquis Next remote 91 days- JBox, RN/CVRS

## 2020-12-10 NOTE — Evaluation (Signed)
Physical Therapy Evaluation Patient Details Name: Corey Tyler MRN: [id number] DOB: 08-Nov-1941 Today's Date: 12/10/2020   History of Present Illness  Pt is an 80 y/o male admitted secondary to syncopal episode with AICD firing. Pt found to be covid +. Pt with R foot mildly displaced sesamoid bone. PMH includes a fib, HTN, CHF, s/p AICD, and COPD on 3-4L.  Clinical Impression  Pt admitted secondary to problem above with deficits below. Pt requiring min to min guard A and at least 1 UE support to ambulate short distance in ED room. Pt reports normally using rollator, but can only go short distance. SpO2 dropping to 83-89% in standing on 5L. BP sitting 121/76. standing 96/63. BP after marching 105/59. BP 130/70 at end of session. placed O2 monitor on earlobe and reading 95-100% on 5L. Anticipate pt will progress well and be able to d/c home with HHPT. Will continue to follow acutely.     Follow Up Recommendations Home health PT;Supervision for mobility/OOB    Equipment Recommendations  None recommended by PT    Recommendations for Other Services       Precautions / Restrictions Precautions Precautions: Fall Required Braces or Orthoses: Other Brace Other Brace: CAM walker boot on the R. Restrictions Weight Bearing Restrictions: No Other Position/Activity Restrictions: WBAT in CAM walker      Mobility  Bed Mobility               General bed mobility comments: In recliner upon entry    Transfers Overall transfer level: Needs assistance Equipment used: 2 person hand held assist Transfers: Sit to/from Stand Sit to Stand: Min assist;+2 physical assistance;+2 safety/equipment         General transfer comment: Min A for lift assist and steadying. Mild LOB posteriorly upon standing that required min A to correct.  Ambulation/Gait Ambulation/Gait assistance: Min assist;+2 safety/equipment Gait Distance (Feet): 2 Feet Assistive device: 1 person hand held assist Gait  Pattern/deviations: Step-to pattern;Decreased weight shift to right;Antalgic Gait velocity: Decreased   General Gait Details: Ambulated to/from recliner in ED room. Min A for steadying A. Pt asymptomatic throughout.  Stairs            Wheelchair Mobility    Modified Rankin (Stroke Patients Only)       Balance Overall balance assessment: Needs assistance Sitting-balance support: No upper extremity supported;Feet supported Sitting balance-Leahy Scale: Good     Standing balance support: Bilateral upper extremity supported;Single extremity supported Standing balance-Leahy Scale: Poor Standing balance comment: Reliant on at least 1 UE support                             Pertinent Vitals/Pain Pain Assessment: Faces Faces Pain Scale: Hurts a little bit Pain Location: R foot Pain Descriptors / Indicators: Guarding Pain Intervention(s): Limited activity within patient's tolerance;Monitored during session;Repositioned    Home Living Family/patient expects to be discharged to:: Private residence Living Arrangements: Spouse/significant other Available Help at Discharge: Family;Available 24 hours/day Type of Home: House Home Access: Level entry     Home Layout: One level Home Equipment: Walker - 4 wheels;Shower seat;Wheelchair - Press photographer      Prior Function Level of Independence: Independent with assistive device(s)   Gait / Transfers Assistance Needed: Uses a rollator     Comments: Using rollator for ambulation. Independent with ADLs; requiring assist for IADLs.     Hand Dominance   Dominant Hand: Left    Extremity/Trunk Assessment  Upper Extremity Assessment Upper Extremity Assessment: Defer to OT evaluation    Lower Extremity Assessment Lower Extremity Assessment: RLE deficits/detail;Generalized weakness RLE Deficits / Details: R foot in CAM walker boot    Cervical / Trunk Assessment Cervical / Trunk Assessment: Normal   Communication   Communication: No difficulties  Cognition Arousal/Alertness: Awake/alert Behavior During Therapy: WFL for tasks assessed/performed Overall Cognitive Status: Within Functional Limits for tasks assessed                                        General Comments General comments (skin integrity, edema, etc.): Pt on 5L upon arrival and SpO2 94%. ON 3l, SpO2 89%. Placed back on 5L. SpO2 dropping to 83-89% in standing. BP siting 121/76. standing 96/63. BP after marching 105/59. BP 130/70 at end of session. placed O2 monitor on earlobe and reading 95-100% on 5L.    Exercises     Assessment/Plan    PT Assessment Patient needs continued PT services  PT Problem List Decreased strength;Decreased activity tolerance;Decreased balance;Decreased mobility;Decreased knowledge of use of DME;Decreased safety awareness;Decreased knowledge of precautions       PT Treatment Interventions DME instruction;Gait training;Functional mobility training;Therapeutic activities;Therapeutic exercise;Balance training;Patient/family education    PT Goals (Current goals can be found in the Care Plan section)  Acute Rehab PT Goals Patient Stated Goal: to go home PT Goal Formulation: With patient Time For Goal Achievement: 12/24/20 Potential to Achieve Goals: Good    Frequency Min 3X/week   Barriers to discharge        Co-evaluation PT/OT/SLP Co-Evaluation/Treatment: Yes Reason for Co-Treatment: For patient/therapist safety;To address functional/ADL transfers PT goals addressed during session: Mobility/safety with mobility;Balance         AM-PAC PT "6 Clicks" Mobility  Outcome Measure Help needed turning from your back to your side while in a flat bed without using bedrails?: A Little Help needed moving from lying on your back to sitting on the side of a flat bed without using bedrails?: A Little Help needed moving to and from a bed to a chair (including a wheelchair)?: A  Little Help needed standing up from a chair using your arms (e.g., wheelchair or bedside chair)?: A Little Help needed to walk in hospital room?: A Little Help needed climbing 3-5 steps with a railing? : A Lot 6 Click Score: 17    End of Session Equipment Utilized During Treatment: Oxygen Activity Tolerance: Patient tolerated treatment well Patient left: in chair;with call bell/phone within reach (in chair in ED) Nurse Communication: Mobility status PT Visit Diagnosis: Unsteadiness on feet (R26.81);Muscle weakness (generalized) (M62.81);History of falling (Z91.81)    Time: 1425-1450 PT Time Calculation (min) (ACUTE ONLY): 25 min   Charges:   PT Evaluation $PT Eval Moderate Complexity: 1 Mod          Reuel Derby, PT, DPT  Acute Rehabilitation Services  Pager: [phone] Office: [phone]   Rudean Hitt 12/10/2020, 3:38 PM

## 2020-12-10 NOTE — ED Notes (Signed)
Patient transported from ED 10 to ED 42. Report received and care assumed. Warms blankets provided. Urinal at bedside. Cardiac, bp, and pulse ox monitoring in place. Call bell in reach. Lights turned down. Awaiting inpatient bed assignment. Will continue to monitor.

## 2020-12-10 NOTE — Evaluation (Signed)
Occupational Therapy Evaluation Patient Details Name: Corey Tyler MRN: [id number] DOB: [birth year] Today's Date: 12/10/2020    History of Present Illness Pt is an 80 y/o male admitted secondary to syncopal episode with AICD firing. Pt found to be covid +. Pt with R foot mildly displaced sesamoid bone. PMH includes a fib, HTN, CHF, s/p AICD, and COPD on 3-4L.   Clinical Impression   PTA, pt was living with his wife and was performing BADLs and using rollator and electric scooter for mobility. Pt currently requiring Min A for ADLs and functional mobility with hand held A. Pt presenting with decreased balance, strength, and activity tolerance. Pt would benefit from further acute OT to facilitate safe dc. Recommend dc to home with HHOT for further OT to optimize safety, independence with ADLs, and return to PLOF.   Pt on 5L upon arrival and SpO2 94%. On 3L, SpO2 89-84%. Placed back on 5L. SpO2 dropping to 83-89% in standing. Placed O2 monitor on earlobe and reading 95-100% on 5L at rest.  BP: siting 121/76. standing 96/63. after marching 105/59. 130/70 at end of session.   Follow Up Recommendations  Home health OT;Supervision/Assistance - 24 hour    Equipment Recommendations  None recommended by OT    Recommendations for Other Services PT consult     Precautions / Restrictions Precautions Precautions: Fall Required Braces or Orthoses: Other Brace Other Brace: CAM walker boot on the R. Restrictions Weight Bearing Restrictions: No Other Position/Activity Restrictions: WBAT in CAM walker      Mobility Bed Mobility               General bed mobility comments: In recliner upon entry    Transfers Overall transfer level: Needs assistance Equipment used: 2 person hand held assist Transfers: Sit to/from Stand Sit to Stand: Min assist;+2 physical assistance;+2 safety/equipment         General transfer comment: Min A for lift assist and steadying. Mild LOB posteriorly upon  standing that required min A to correct.    Balance Overall balance assessment: Needs assistance Sitting-balance support: No upper extremity supported;Feet supported Sitting balance-Leahy Scale: Good     Standing balance support: Bilateral upper extremity supported;Single extremity supported Standing balance-Leahy Scale: Poor Standing balance comment: Reliant on at least 1 UE support                           ADL either performed or assessed with clinical judgement   ADL Overall ADL's : Needs assistance/impaired Eating/Feeding: Set up;Sitting   Grooming: Set up;Supervision/safety;Sitting   Upper Body Bathing: Supervision/ safety;Set up;Sitting   Lower Body Bathing: Minimal assistance;Sit to/from stand   Upper Body Dressing : Supervision/safety;Set up;Sitting   Lower Body Dressing: Minimal assistance;Sit to/from stand   Toilet Transfer: Minimal assistance;+2 for safety/equipment (simulated at recliner)           Functional mobility during ADLs: Minimal assistance;+2 for safety/equipment General ADL Comments: Presenting with decreased balance, strength, and activity tolerance.  However, feel pt is close to baseline.     Vision Baseline Vision/History: Wears glasses Wears Glasses: Reading only Patient Visual Report: No change from baseline       Perception     Praxis      Pertinent Vitals/Pain Pain Assessment: Faces Faces Pain Scale: Hurts a little bit Pain Location: R foot Pain Descriptors / Indicators: Guarding Pain Intervention(s): Limited activity within patient's tolerance;Monitored during session;Repositioned     Hand Dominance Left  Extremity/Trunk Assessment Upper Extremity Assessment Upper Extremity Assessment: Overall WFL for tasks assessed   Lower Extremity Assessment Lower Extremity Assessment: Defer to PT evaluation RLE Deficits / Details: R foot in CAM walker boot   Cervical / Trunk Assessment Cervical / Trunk Assessment:  Normal   Communication Communication Communication: No difficulties   Cognition Arousal/Alertness: Awake/alert Behavior During Therapy: WFL for tasks assessed/performed Overall Cognitive Status: Within Functional Limits for tasks assessed                                     General Comments  Pt on 5L upon arrival and SpO2 94%. ON 3l, SpO2 89%. Placed back on 5L. SpO2 dropping to 83-89% in standing. BP siting 121/76. standing 96/63. BP after marching 105/59. BP 130/70 at end of session. placed O2 monitor on earlobe and reading 95-100% on 5L at rest.    Exercises     Shoulder Instructions      Home Living Family/patient expects to be discharged to:: Private residence Living Arrangements: Spouse/significant other Available Help at Discharge: Family;Available 24 hours/day Type of Home: House Home Access: Level entry     Home Layout: One level     Bathroom Shower/Tub: Occupational psychologist: Standard     Home Equipment: Environmental consultant - 4 wheels;Shower seat;Wheelchair - Press photographer          Prior Functioning/Environment Level of Independence: Independent with assistive device(s)  Gait / Transfers Assistance Needed: Uses a rollator     Comments: Using rollator for ambulation. Independent with ADLs; requiring assist for IADLs.        OT Problem List: Decreased strength;Decreased activity tolerance;Impaired balance (sitting and/or standing);Decreased knowledge of use of DME or AE;Decreased knowledge of precautions      OT Treatment/Interventions: Self-care/ADL training;Therapeutic exercise;Energy conservation;Therapeutic activities;Patient/family education;DME and/or AE instruction    OT Goals(Current goals can be found in the care plan section) Acute Rehab OT Goals Patient Stated Goal: to go home OT Goal Formulation: With patient Time For Goal Achievement: 12/24/20 Potential to Achieve Goals: Good  OT Frequency: Min 2X/week   Barriers  to D/C:            Co-evaluation PT/OT/SLP Co-Evaluation/Treatment: Yes Reason for Co-Treatment: For patient/therapist safety;To address functional/ADL transfers PT goals addressed during session: Mobility/safety with mobility;Balance OT goals addressed during session: ADL's and self-care      AM-PAC OT "6 Clicks" Daily Activity     Outcome Measure Help from another person eating meals?: None Help from another person taking care of personal grooming?: A Little Help from another person toileting, which includes using toliet, bedpan, or urinal?: A Little Help from another person bathing (including washing, rinsing, drying)?: A Little Help from another person to put on and taking off regular upper body clothing?: A Little Help from another person to put on and taking off regular lower body clothing?: A Little 6 Click Score: 19   End of Session Equipment Utilized During Treatment: Oxygen Nurse Communication: Mobility status  Activity Tolerance: Patient tolerated treatment well Patient left: in chair;with call bell/phone within reach  OT Visit Diagnosis: Other abnormalities of gait and mobility (R26.89);Unsteadiness on feet (R26.81);Muscle weakness (generalized) (M62.81);History of falling (Z91.81)                Time: [id number] OT Time Calculation (min): 23 min Charges:  OT General Charges $OT Visit: 1 Visit OT Evaluation $OT Eval  Moderate Complexity: 1 Mod  Farhad Burleson MSOT, OTR/L Acute Rehab Pager: [phone] Office: [place] 12/10/2020, 3:50 PM

## 2020-12-10 NOTE — Consult Note (Addendum)
Cardiology Consultation:   Patient ID: Corey Tyler MRN: [id number]; DOB: [birth year]  Admit date: 12/09/2020 Date of Consult: 12/10/2020  Primary Care Provider: [name], MD [facility] Cardiologist: Minus Breeding, MD  [facility] Electrophysiologist:  Virl Axe, MD    Patient Profile:   Corey Tyler is a 80 y.o. male with a hx of Paroxysmal AFib, NICM w/ICD, chronic CHF (systolic), VT (polymorphic in 2014), COPD is O2 dependent  who is being seen today for the evaluation of syncope/VT at the request of Dr. Candiss Norse.  Device information: SJM dual chamber ICD, implanted 01/06/12, Dr. [name], primary prevention\ The patient is aware of device alert, industry representative d/w the patient again today he was aware and has been already demonstrated the vibratory alert  VT hx + hx ploymorphic VT with therapy 2014 Monomorphic VT below detection rate Dec 2017 VT ablation 06/10/2017 (Dr. Lovena Le) Nov 2018 VT ATP Feb 2021  AAD hx 2017 Amiodarone is current 2018 Ranexa > uptitrated is current Record reports intolerant of Mexiletine (feeling of off balance)  AFib Hx GI bleed on Xarelto  History of Present Illness:   Corey Tyler was referred to the ER y the device clinic 2/2 VT episodes that was associated with syncope.  He has a loing hx of VT and has been maintained on amiodarone and Ranexa. His ICD placed in 2013, recently reached ERI in December. Hew was planned for generator change though his pre-op testing was COVID + and cancelled  The patient reports that he had been feeling fine, perhaps a little SOB/cough with the COVID illness, no CP, no palpitations.  The day of the VT he was at the sink, suddenly felt weak like his BP was dropping and then woke on the floor, he was unaware of the shock.  He has felt well since without recurrent symptoms.  LABS K+ 4.3 Mag 2.2 BUN/Creat 22/1.20 HS Trop 23 WBC 3.5 H/H 12/37 Plts 159   Device transmission  (12/09/20) presenting rhythm is AS/VS Battery ERI reached 11/11/20 Lead measurements are good One VT episode 12/08/20 @ 11;12AM PMVT  > ATP > MMVT >ATP  > shock successful No other arrhythmias  Past Medical History:  Diagnosis Date  . A-fib ([place])    a. Noted on 12/2012 interrogation, placed on Apixaban and ultimately stopped NOACs 2/2 personal decision 8/14.  Marland Kitchen AICD (automatic cardioverter/defibrillator) present    Cardiac defibrillator -dual  [employer] Judes  . Arthritis   . Atrial tachycardia-non sustained    a. Noted on 03/2012 interrogation.  . Chronic systolic heart failure (HCC)    EF down to 20 to 25% per echo November 2012  . COPD (chronic obstructive pulmonary disease) ([place])   . Hyperlipidemia   . Hypertension   . ICD (implantable cardiac defibrillator) in place   . NICM (nonischemic cardiomyopathy) ([place])    a. Normal cors 2000. b. Minimal plaque 2013.  . Old myocardial infarction    a. ?Silent MI. b. Large fixed inferolateral defect c/w with prior infarct, no ischemia. c. Cath in 2000/2013 with only minimal CAD.  Marland Kitchen Presence of permanent cardiac pacemaker   . Pulmonary nodule, right    Last scan in 2010 showing stability; felt to be benign.  Marland Kitchen PVC's (premature ventricular contractions)   . Ventricular tachycardia, polymorphic ([place])    Rx via ICD 12/14    Past Surgical History:  Procedure Laterality Date  . APPENDECTOMY    . CARDIAC CATHETERIZATION  2000  . CARDIAC DEFIBRILLATOR PLACEMENT    .  CARDIOVERSION  03/30/2013   Procedure: CARDIOVERSION;  Surgeon: Thayer Headings, MD;  Location: [place];  Service: Cardiovascular;;  . COLON SURGERY    . COLONOSCOPY N/A 09/25/2013   Procedure: COLONOSCOPY;  Surgeon: Jerene Bears, MD;  Location: [facility];  Service: Endoscopy;  Laterality: N/A;  . ICD  12/2011   [name]  . IMPLANTABLE CARDIOVERTER DEFIBRILLATOR IMPLANT N/A 01/06/2012   Procedure: IMPLANTABLE CARDIOVERTER DEFIBRILLATOR IMPLANT;  Surgeon: Deboraha Sprang, MD;   Location: [facility] CATH LAB;  Service: Cardiovascular;  Laterality: N/A;  . PACEMAKER IMPLANT     St [name]  . TEE WITHOUT CARDIOVERSION N/A 03/30/2013   Procedure: TRANSESOPHAGEAL ECHOCARDIOGRAM (TEE);  Surgeon: Thayer Headings, MD;  Location: [place];  Service: Cardiovascular;  Laterality: N/A;  . V TACH ABLATION  06/10/2017  . V TACH ABLATION N/A 06/10/2017   Procedure: V Tach Ablation;  Surgeon: Evans Lance, MD;  Location: [place] CV LAB;  Service: Cardiovascular;  Laterality: N/A;     Home Medications:  Prior to Admission medications   Medication Sig Start Date End Date Taking? Authorizing Provider  acetaminophen (TYLENOL) 500 MG tablet Take 500 mg by mouth every 6 (six) hours as needed for headache (pain).   Yes [provider]  albuterol (PROVENTIL) (2.5 MG/3ML) 0.083% nebulizer solution Take 2.5 mg by nebulization every 6 (six) hours as needed for wheezing or shortness of breath.   Yes [provider]  albuterol (VENTOLIN HFA) 108 (90 Base) MCG/ACT inhaler Inhale 2 puffs into the lungs every 6 (six) hours as needed for wheezing or shortness of breath.   Yes [provider]  amiodarone (PACERONE) 200 MG tablet Take 200 mg by mouth daily.   Yes [provider]  budesonide-formoterol (SYMBICORT) 160-4.5 MCG/ACT inhaler Inhale 2 puffs into the lungs 2 (two) times daily.   Yes [provider]  Carboxymethylcellul-Glycerin (LUBRICATING EYE DROPS OP) Place 1 drop into both eyes daily.   Yes [provider]  cholecalciferol (VITAMIN D) 1000 UNITS tablet Take 1,000 Units by mouth daily.   Yes [provider]  ELIQUIS 5 MG TABS tablet TAKE 1 TABLET BY MOUTH TWICE A DAY Patient taking differently: Take 5 mg by mouth 2 (two) times daily. 07/30/15  Yes Minus Breeding, MD  finasteride (PROSCAR) 5 MG tablet Take 5 mg by mouth daily.    Yes [provider]  levothyroxine (SYNTHROID, LEVOTHROID) 125 MCG tablet Take 125 mcg by  mouth daily before breakfast.  06/15/17  Yes [provider]  MAGNESIUM PO Take 420 mg by mouth at bedtime.   Yes [provider]  Melatonin 3 MG TABS Take 3 mg by mouth at bedtime.   Yes [provider]  metoprolol succinate (TOPROL XL) 25 MG 24 hr tablet TAKE 1/2 TABLET DAILY Patient taking differently: Take 25 mg by mouth daily. 08/13/20  Yes Minus Breeding, MD  Multiple Vitamins-Minerals (MULTIVITAMIN WITH MINERALS) tablet Take 1 tablet by mouth daily. Mens 50 plus   Yes [provider]  nitroGLYCERIN (NITROSTAT) 0.4 MG SL tablet Place 0.4 mg under the tongue every 5 (five) minutes as needed for chest pain.  11/12/11  Yes Burtis Junes, NP  Omega-3 Fatty Acids (FISH OIL) 1000 MG CPDR Take 1,000 mg by mouth daily.   Yes [provider]  OXYGEN Inhale 3-4 L into the lungs See admin instructions. 3 L at rest and 4 L with exertion   Yes [provider]  potassium chloride (MICRO-K) 10  MEQ CR capsule Take 10 mEq by mouth at bedtime.   Yes [provider]  promethazine (PHENERGAN) 25 MG tablet Take 25 mg by mouth daily as needed for nausea or vomiting.  04/20/17  Yes [provider]  Psyllium (METAMUCIL PO) Take 15 mLs by mouth daily. Mix in liquid and drink   Yes [provider]  ranolazine (RANEXA) 1000 MG SR tablet Take 1 tablet (1,000 mg total) by mouth 2 (two) times daily. 09/23/18  Yes Deboraha Sprang, MD  sodium chloride (OCEAN) 0.65 % SOLN nasal spray Place 1 spray into both nostrils daily as needed for congestion.   Yes [provider]  tamsulosin (FLOMAX) 0.4 MG CAPS capsule Take 0.4 mg by mouth at bedtime.   Yes [provider]  Tiotropium Bromide Monohydrate (SPIRIVA RESPIMAT) 2.5 MCG/ACT AERS Inhale 2 puffs into the lungs daily.   Yes [provider]  torsemide (DEMADEX) 20 MG tablet Take 1 tablet by mouth daily and 1/2 tablet as needed Patient taking differently: Take 20-30 mg by  mouth See admin instructions. Take one tablet (20 mg) by mouth daily, on Saturday, Sunday, Wednesday take an additional 1/2 tablet (10 mg) for a total dose of 30 mg on those days 05/14/20  Yes Minus Breeding, MD  azithromycin (ZITHROMAX) 250 MG tablet Take 250-500 mg by mouth See admin instructions. Take 2 tablets (500 mg) by mouth 1st day, then take 1 tablet (250 mg) daily on days 2-5 12/04/20   [provider]    Inpatient Medications: Scheduled Meds: . amiodarone  200 mg Oral Daily  . apixaban  2.5 mg Oral BID  . cholecalciferol  1,000 Units Oral Daily  . dexamethasone  6 mg Oral Daily  . finasteride  5 mg Oral Daily  . fluticasone furoate-vilanterol  1 puff Inhalation Daily  . insulin aspart  0-9 Units Subcutaneous TID WC  . Ipratropium-Albuterol  1 puff Inhalation Q6H  . levothyroxine  125 mcg Oral QAC breakfast  . magnesium oxide  400 mg Oral QHS  . melatonin  3 mg Oral QHS  . metoprolol tartrate  12.5 mg Oral BID  . multivitamin with minerals  1 tablet Oral Daily  . omega-3 acid ethyl esters  1 g Oral Daily  . polyvinyl alcohol  1 drop Both Eyes Daily  . psyllium  1 packet Oral Daily  . ranolazine  500 mg Oral BID  . sodium chloride flush  3 mL Intravenous Q12H  . tamsulosin  0.4 mg Oral QHS  . umeclidinium bromide  1 puff Inhalation Daily   Continuous Infusions:  PRN Meds: acetaminophen **OR** [DISCONTINUED] acetaminophen, guaiFENesin-dextromethorphan, nitroGLYCERIN, promethazine, sodium chloride  Allergies:    Allergies  Allergen Reactions  . Atorvastatin Shortness Of Breath and Cough  . Statins Shortness Of Breath and Other (See Comments)    Muscle and joing pain and "These bother my lungs and make my gait unsteady, also"  . Xarelto [Rivaroxaban] Other (See Comments)    Bleeding  . Adhesive [Tape] Hives, Itching and Rash  . Codeine Nausea Only  . Isosorbide Nitrate Other (See Comments)    Made him feel bad  . Latex Itching and Other (See Comments)     Rash, itching, burning  . Levofloxacin Other (See Comments)    Tachycardia  . Lisinopril Cough  . Lorazepam Other (See Comments)    "felt bad"  . Mexiletine Other (See Comments)    GI distress  . Sertraline Other (See Comments)  Made him feel bad    Social History:   Social History   Socioeconomic History  . Marital status: Married    Spouse name: Not on file  . Number of children: 2  . Years of education: Not on file  . Highest education level: Not on file  Occupational History  . Occupation: Retired    Comment: Press photographer  . Occupation: Works    Fish farm manager: [employer]  Tobacco Use  . Smoking status: Former Smoker    Packs/day: 2.50    Years: 40.00    Pack years: 100.00    Types: Cigarettes    Quit date: 11/24/1983    Years since quitting: 37.0  . Smokeless tobacco: Never Used  . Tobacco comment: 2 1/2 ppd x 40 years  Vaping Use  . Vaping Use: Never used  Substance and Sexual Activity  . Alcohol use: No  . Drug use: No  . Sexual activity: Yes    Birth control/protection: None  Other Topics Concern  . Not on file  Social History Narrative   Still works at [employer] part-time   Lives with Wife in [place] Strain: Not on file  Food Insecurity: Not on file  Transportation Needs: Not on file  Physical Activity: Not on file  Stress: Not on file  Social Connections: Not on file  Intimate Partner Violence: Not on file    Family History:   Family History  Problem Relation Age of Onset  . Emphysema Mother   . Heart disease Mother        AVR  . Heart failure Father        Died agwe 84  . Esophageal cancer Brother   . Colon cancer Neg Hx      ROS:  Please see the history of present illness.  All other ROS reviewed and negative.     Physical Exam/Data:   Vitals:   12/10/20 0500 12/10/20 0600 12/10/20 0800 12/10/20 0914  BP: 135/80 (!) 142/81 (!) 141/76 122/72  Pulse: 61 (!) 59 63 65  Resp:  15 14 17 15   Temp:      TempSrc:      SpO2: 93% 90% 90% (!) 88%  Weight:    90.7 kg  Height:    6\' 1"  (1.854 m)    Intake/Output Summary (Last 24 hours) at 12/10/2020 1010 Last data filed at 12/09/2020 2100 Gross per 24 hour  Intake -  Output 200 ml  Net -200 ml   Last 3 Weights 12/10/2020 10/25/2020 08/12/2020  Weight (lbs) 200 lb 211 lb 12.8 oz 211 lb  Weight (kg) 90.719 kg 96.072 kg 95.709 kg     Body mass index is 26.39 kg/m.   The patient was seen and examined by Dr. Curt Bears General:  Well nourished, well developed, in no acute distress HEENT: normal Lymph: no adenopathy Neck: no JVD Endocrine:  No thryomegaly Vascular: No carotid bruits  Cardiac:  RRR; no murmurs, gallops or rubs Lungs:  CTA b/l, no wheezing, rhonchi or rales  Abd: soft, nontender  Ext: no edema Musculoskeletal:  No deformities, R ortho boot Skin: warm and dry  Neuro:  No gross focal abnormalities noted Psych:  Normal affect   EKG:  The EKG was personally reviewed and demonstrates:    SR 60bpm, , no ST/T changes  Telemetry:  Telemetry was personally reviewed and demonstrates:   SR 60's-70's  Relevant CV Studies:  08/18/2019: TTE IMPRESSIONS  1. Left ventricular ejection fraction, by visual estimation, is 25 to  30%. The left ventricle has severely decreased function. Mildly increased  left ventricular size. There is mildly increased left ventricular  hypertrophy.  2. Left ventricular diastolic Doppler parameters are consistent with  impaired relaxation pattern of LV diastolic filling.  3. Global right ventricle has normal systolic function.The right  ventricular size is mildly enlarged.  4. Left atrial size was mildly dilated.  5. Right atrial size was normal.  6. The mitral valve is normal in structure. Mild mitral valve  regurgitation. No evidence of mitral stenosis.  7. The tricuspid valve is normal in structure. Tricuspid valve  regurgitation is mild.  8. The aortic valve is  tricuspid Aortic valve regurgitation is mild by  color flow Doppler. Mild aortic valve sclerosis without stenosis.  9. The pulmonic valve was grossly normal. Pulmonic valve regurgitation is  trivial by color flow Doppler.  10. Aortic dilatation noted.  11. There is mild dilatation of the aortic root measuring 39 mm.  12. Mildly elevated pulmonary artery systolic pressure.  13. A pacer wire is visualized.  14. The inferior vena cava is normal in size with greater than 50%  respiratory variability, suggesting right atrial pressure of 3 mmHg.  15. Severe LV systolic dysfunction; grade 1 diastolic dysfunction; mild  LVH and LVE; mild AI, MR and TR; mild LAE and RVE; mildly elevated  pulmonary pressure.     06/10/2017: EPS/ablation Conclusion: Successful EP study and catheter ablation of the patient's clinical ventricular tachycardia utilizing 3-dimensional electro anatomic mapping. There is a paucity of scar tissue on the endocardium of the left ventricle. A second VT was induced which was hemodynamically unstable and could not be mapped. There is no clearcut scar tissue associated with the endocardial surface of the left ventricle from which this VT originated.    07/22/16: TTE Study Conclusions - Left ventricle: The cavity size was mildly dilated. There was mild focal basal hypertrophy of the septum. Systolic function was severely reduced. The estimated ejection fraction was in the range of 25% to 30%. Diffuse hypokinesis. Hypokinesis is worse in the inferolateral and anteriolateral myocardium. Doppler parameters are consistent with abnormal left ventricular relaxation (grade 1 diastolic dysfunction). Doppler parameters are consistent with indeterminate ventricular filling pressure. - Aortic valve: Transvalvular velocity was within the normal range. There was no stenosis. There was mild regurgitation. - Mitral valve: Transvalvular velocity was within the normal  range. There was no evidence for stenosis. There was mild regurgitation. - Left atrium: The atrium was moderately dilated. 77mm - Right ventricle: The cavity size was mildly dilated. Wall thickness was normal. Systolic function was mildly reduced. - Tricuspid valve: There was mild regurgitation. - Pulmonic valve: Transvalvular velocity was within the normal range. There was no evidence for stenosis. There was mild regurgitation. - Pulmonary arteries: Systolic pressure was mildly increased. PA peak pressure: 41 mm Hg (S). - Pericardium, extracardiac: A small pericardial effusion was identified.   Laboratory Data:  High Sensitivity Troponin:   Recent Labs  Lab 12/09/20 1132 12/09/20 1333  TROPONINIHS 23* 23*     Chemistry Recent Labs  Lab 12/09/20 1132 12/10/20 0602  NA 136 133*  K 3.6 4.3  CL 96* 98  CO2 28 25  GLUCOSE 112* 129*  BUN 24* 22  CREATININE 1.68* 1.20  CALCIUM 8.1* 8.5*  GFRNONAA 41* >60  ANIONGAP 12 10    Recent Labs  Lab 12/10/20 0602  PROT 5.5*  ALBUMIN 2.7*  AST 45*  ALT 63*  ALKPHOS 48  BILITOT 0.8   Hematology Recent Labs  Lab 12/09/20 1132 12/10/20 0602  WBC 5.8 3.5*  RBC 3.66* 3.81*  HGB 11.9* 12.4*  HCT 36.3* 37.5*  MCV 99.2 98.4  MCH 32.5 32.5  MCHC 32.8 33.1  RDW 14.6 14.1  PLT 164 159   BNP Recent Labs  Lab 12/09/20 1141 12/09/20 1645  BNP 81.8 93.6    DDimer  Recent Labs  Lab 12/09/20 1645 12/10/20 0602  DDIMER 0.39 0.27     Radiology/Studies:   DG Chest 2 View Result Date: 12/09/2020 CLINICAL DATA:  Syncope EXAM: CHEST - 2 VIEW COMPARISON:  December 29, 2019 FINDINGS: Ill-defined opacity in the left base is suspicious for pneumonia. There is underlying interstitial thickening throughout the lungs. Heart is upper normal in size with pulmonary vascularity normal. Pacemaker leads attached to right atrium and right ventricle. No adenopathy. There is aortic atherosclerosis. No bone lesions. IMPRESSION:  Ill-defined opacity left base concerning for pneumonia. Underlying interstitial thickening likely represents a degree of chronic bronchitis. Heart upper normal in size with pacemaker leads attached to right atrium and right ventricle. Aortic Atherosclerosis (ICD10-I70.0). Electronically Signed   By: Lowella Grip III M.D.   On: 12/09/2020 15:02    CT Head Wo Contrast Result Date: 12/09/2020 CLINICAL DATA:  Head trauma, minor (Age >= 65y) EXAM: CT HEAD WITHOUT CONTRAST TECHNIQUE: Contiguous axial images were obtained from the base of the skull through the vertex without intravenous contrast. COMPARISON:  07/29/2019 and prior. FINDINGS: Brain: No acute infarct or intracranial hemorrhage. No mass lesion. No midline shift, ventriculomegaly or extra-axial fluid collection. Mild cerebral volume loss with ex vacuo dilatation. Chronic microvascular ischemic changes. Vascular: No hyperdense vessel or unexpected calcification. Skull: Negative for fracture or focal lesion. Sinuses/Orbits: Sequela of bilateral lens replacement. Mild ethmoid and maxillary sinus mucosal thickening. No mastoid effusion. Other: None. IMPRESSION: No acute intracranial process. Mild cerebral atrophy and chronic microvascular ischemic changes. Electronically Signed   By: Primitivo Gauze M.D.   On: 12/09/2020 12:25     DG Foot Complete Right Result Date: 12/09/2020 CLINICAL DATA:  Right foot pain after fall. EXAM: RIGHT FOOT COMPLETE - 3+ VIEW COMPARISON:  Apr 09, 2016. FINDINGS: Mildly displaced fracture is seen involving the sesamoid bone adjacent to the distal portion of the first metatarsal. There is no evidence of arthropathy or other focal bone abnormality. Soft tissues are unremarkable. IMPRESSION: Mildly displaced sesamoid bone adjacent to distal portion of first metatarsal. Electronically Signed   By: Marijo Conception M.D.   On: 12/09/2020 12:47     Assessment and Plan:   1. VT     Long hx of VT     NICM, no CP or  anginal symptoms     One HS Trop of 23     Perhaps exacerbated by his COVID illness     For now increase amiodarone to 200mg  BID     Early EP follow up will be arranged for >10 days after his COVID + test  2. NICM     Not felt to be volume OL     CorVue looks good, above threshold  3. Persistent AFib      Burden is zero %     On Eliquis, appropriately dosed      Dr. Curt Bears has seen and examined the patient EP will sign off though remain available, please recall if needed  For questions or updates, please contact [place] Please consult [email or web address] for contact info under    Signed, Baldwin Jamaica, PA-C  12/10/2020 10:10 AM  I have seen and examined this patient with Tommye Standard.  Agree with above, note added to reflect my findings.  On exam, RRR, no murmurs.  Patient presented to the hospital after an episode of syncope with ICD shock.  He did receive multiple rounds of ATP which failed to convert him to normal rhythm.  He has a history of ventricular tachycardia and is on amnio with a history of a VT ablation.  He is also COVID-19 positive and is being worked up by internal medicine for this.  It is certainly possible that the inflammation from COVID-19 worsened his ventricular arrhythmias and was partially responsible for his VT.  We will increase his amiodarone to 200 mg twice daily.  We will arrange for follow-up in EP clinic.  Will M. Camnitz MD 12/10/2020 1:14 PM

## 2020-12-10 NOTE — Progress Notes (Signed)
  Echocardiogram 2D Echocardiogram has been performed.  Corey Tyler 12/10/2020, 4:30 PM

## 2020-12-10 NOTE — ED Notes (Signed)
Patient up in recliner chair,

## 2020-12-10 NOTE — ED Notes (Signed)
Tele  Breakfast Ordered 

## 2020-12-11 LAB — CBC WITH DIFFERENTIAL/PLATELET
Abs Immature Granulocytes: 0.05 10*3/uL (ref 0.00–0.07)
Basophils Absolute: 0 10*3/uL (ref 0.0–0.1)
Basophils Relative: 0 %
Eosinophils Absolute: 0 10*3/uL (ref 0.0–0.5)
Eosinophils Relative: 0 %
HCT: 35.6 % — ABNORMAL LOW (ref 39.0–52.0)
Hemoglobin: 11.7 g/dL — ABNORMAL LOW (ref 13.0–17.0)
Immature Granulocytes: 1 %
Lymphocytes Relative: 10 %
Lymphs Abs: 0.6 10*3/uL — ABNORMAL LOW (ref 0.7–4.0)
MCH: 32.2 pg (ref 26.0–34.0)
MCHC: 32.9 g/dL (ref 30.0–36.0)
MCV: 98.1 fL (ref 80.0–100.0)
Monocytes Absolute: 0.6 10*3/uL (ref 0.1–1.0)
Monocytes Relative: 10 %
Neutro Abs: 5.3 10*3/uL (ref 1.7–7.7)
Neutrophils Relative %: 79 %
Platelets: 151 10*3/uL (ref 150–400)
RBC: 3.63 MIL/uL — ABNORMAL LOW (ref 4.22–5.81)
RDW: 14.1 % (ref 11.5–15.5)
WBC: 6.6 10*3/uL (ref 4.0–10.5)
nRBC: 0 % (ref 0.0–0.2)

## 2020-12-11 LAB — COMPREHENSIVE METABOLIC PANEL
ALT: 54 U/L — ABNORMAL HIGH (ref 0–44)
AST: 33 U/L (ref 15–41)
Albumin: 2.5 g/dL — ABNORMAL LOW (ref 3.5–5.0)
Alkaline Phosphatase: 43 U/L (ref 38–126)
Anion gap: 9 (ref 5–15)
BUN: 26 mg/dL — ABNORMAL HIGH (ref 8–23)
CO2: 25 mmol/L (ref 22–32)
Calcium: 8.2 mg/dL — ABNORMAL LOW (ref 8.9–10.3)
Chloride: 99 mmol/L (ref 98–111)
Creatinine, Ser: 1.2 mg/dL (ref 0.61–1.24)
GFR, Estimated: >60 mL/min (ref >60)
Glucose, Bld: 137 mg/dL — ABNORMAL HIGH (ref 70–99)
Potassium: 4 mmol/L (ref 3.5–5.1)
Sodium: 133 mmol/L — ABNORMAL LOW (ref 135–145)
Total Bilirubin: 0.5 mg/dL (ref 0.3–1.2)
Total Protein: 5.1 g/dL — ABNORMAL LOW (ref 6.5–8.1)

## 2020-12-11 LAB — D-DIMER, QUANTITATIVE: D-Dimer, Quant: <0.27 ug/mL-FEU (ref 0.00–0.50)

## 2020-12-11 LAB — GLUCOSE, CAPILLARY
Glucose-Capillary: 120 mg/dL — ABNORMAL HIGH (ref 70–99)
Glucose-Capillary: 117 mg/dL — ABNORMAL HIGH (ref 70–99)

## 2020-12-11 LAB — PROCALCITONIN: Procalcitonin: <0.1 ng/mL

## 2020-12-11 LAB — BRAIN NATRIURETIC PEPTIDE: B Natriuretic Peptide: 129.8 pg/mL — ABNORMAL HIGH (ref 0.0–100.0)

## 2020-12-11 LAB — C-REACTIVE PROTEIN: CRP: 8.3 mg/dL — ABNORMAL HIGH (ref <1.0)

## 2020-12-11 LAB — MAGNESIUM: Magnesium: 2.2 mg/dL (ref 1.7–2.4)

## 2020-12-11 MED ORDER — METHYLPREDNISOLONE 4 MG PO TBPK: ORAL_TABLET | ORAL | 0 refills | Status: DC

## 2020-12-11 MED ORDER — AMIODARONE HCL 200 MG PO TABS: 200.0000 mg | ORAL_TABLET | Freq: Two times a day (BID) | ORAL | 0 refills | Status: DC

## 2020-12-11 NOTE — Progress Notes (Signed)
Pt's stable, DC home via wheelchair 

## 2020-12-11 NOTE — TOC Initial Note (Signed)
Transition of Care [facility]) - Initial/Assessment Note    Patient Details  Name: Corey Tyler MRN: [id number] Date of Birth: Aug 06, 1941  Transition of Care [facility]) CM/SW Contact:    Zenon Mayo, RN Phone Number: 12/11/2020, 11:50 AM  Clinical Narrative:                 Patient is for dc today, NCM asked if could set up HHPT for him, he states he does not want HHPT .  He states he gets around good at home, he has a walker and an Transport planner, and a scale which he weighs him self daily.  His wife takes him to his MD apts. He has no issues with getting his medications.    Expected Discharge Plan: [place] Barriers to Discharge: No Barriers Identified   Patient Goals and CMS Choice Patient states their goals for this hospitalization and ongoing recovery are:: get better   Choice offered to / list presented to : NA  Expected Discharge Plan and Services Expected Discharge Plan: [place] In-house Referral: NA Discharge Planning Services: CM Consult Post Acute Care Choice: NA Living arrangements for the past 2 months: Single Family Home Expected Discharge Date: 12/11/20                 DME Agency: NA       HH Arranged: Refused HH          Prior Living Arrangements/Services Living arrangements for the past 2 months: Single Family Home Lives with:: Spouse Patient language and need for interpreter reviewed:: Yes Do you feel safe going back to the place where you live?: Yes      Need for Family Participation in Patient Care: Yes (Comment) Care giver support system in place?: Yes (comment) Current home services: DME Criminal Activity/Legal Involvement Pertinent to Current Situation/Hospitalization: No - Comment as needed  Activities of Daily Living Home Assistive Devices/Equipment: Dentures (specify type),Walker (specify type) ADL Screening (condition at time of admission) Patient's cognitive ability adequate to safely complete  daily activities?: Yes Is the patient deaf or have difficulty hearing?: No Does the patient have difficulty seeing, even when wearing glasses/contacts?: No Does the patient have difficulty concentrating, remembering, or making decisions?: No Patient able to express need for assistance with ADLs?: Yes Does the patient have difficulty dressing or bathing?: No Independently performs ADLs?: No Does the patient have difficulty walking or climbing stairs?: Yes Weakness of Legs: Right Weakness of Arms/Hands: None  Permission Sought/Granted                  Emotional Assessment   Attitude/Demeanor/Rapport: Engaged Affect (typically observed): Appropriate Orientation: : Oriented to Self,Oriented to Place,Oriented to  Time,Oriented to Situation Alcohol / Substance Use: Not Applicable Psych Involvement: No (comment)  Admission diagnosis:  Syncope [R55] Hypoxia [R09.02] Syncope, unspecified syncope type [R55] Patient Active Problem List   Diagnosis Date Noted  . Syncope 12/09/2020  . Educated about COVID-19 virus infection 05/13/2020  . Orthostatic hypotension 12/29/2019  . NSVT (nonsustained ventricular tachycardia) ([place]) 12/29/2019  . Tremor 10/18/2019  . Hypomagnesemia 09/07/2019  . Hypokalemia 09/07/2019  . CKD (chronic kidney disease) stage 3, GFR 30-59 ml/min ([place]) 09/02/2019  . SBO (small bowel obstruction) ([place]) 08/31/2019  . Chronic respiratory failure with hypoxia ([place]) 08/31/2019  . Chronic anticoagulation 08/31/2019  . Hypothyroidism 08/31/2019  . Chronic nausea 08/31/2019  . Generalized weakness 07/29/2019  . Acute respiratory failure ([place]) 09/15/2017  .  COPD exacerbation ([place]) 09/15/2017  . Sustained ventricular tachycardia ([place]) 06/10/2017  . VT (ventricular tachycardia) ([place]) 11/02/2016  . Benign fibroma of prostate 04/02/2015  . Ventricular tachycardia, polymorphic ([place])   . Diverticulosis of colon without hemorrhage 09/25/2013  . Atrial fibrillation ([place])  12/29/2012  . ICD (implantable cardioverter-defibrillator) in place   . Hyperlipidemia   . PVCs 11/26/2011  . Chronic systolic heart failure ([place]) 10/07/2011  . Cardiomyopathy, nonischemic and ischemic 09/24/2011  . 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 PULMONARY NODULE 12/04/2008   PCP:  [name], MD Pharmacy:   [place], [place] - [place] [facility] [address] [place] [address] Phone: [phone] Fax: [phone]  [id number] [place], [place] [address] [place] [address] Phone: [phone] Fax: [phone]     Social Determinants of Health (SDOH) Interventions Food Insecurity Interventions: Intervention Not Indicated Transportation Interventions: Intervention Not Indicated  Readmission Risk Interventions Readmission Risk Prevention Plan 12/11/2020  Transportation Screening Complete  PCP or Specialist Appt within 5-7 Days Complete  Home Care Screening Complete  Some recent data might be hidden

## 2020-12-11 NOTE — Discharge Instructions (Signed)
Follow with Primary MD [name], MD your primary cardiologist within a week    Get CBC, CMP, 2 view Chest X ray -  checked next visit within 1 week by Primary MD    Activity: As tolerated with Full fall precautions use walker/cane & assistance as needed, wear the cam boot in the right foot at all times except when taking shower.  Disposition Home    Diet: Heart Healthy    Check your Weight same time everyday, if you gain over 2 pounds, or you develop in leg swelling, experience more shortness of breath or chest pain, call your Primary MD immediately. Follow Cardiac Low Salt Diet and 1.5 lit/day fluid restriction.  Special Instructions: If you have smoked or chewed Tobacco  in the last 2 yrs please stop smoking, stop any regular Alcohol  and or any Recreational drug use.  On your next visit with your primary care physician please Get Medicines reviewed and adjusted.  Please request your Prim.MD to go over all Hospital Tests and Procedure/Radiological results at the follow up, please get all Hospital records sent to your Prim MD by signing hospital release before you go home.  If you experience worsening of your admission symptoms, develop shortness of breath, life threatening emergency, suicidal or homicidal thoughts you must seek medical attention immediately by calling 911 or calling your MD immediately  if symptoms less severe.  You Must read complete instructions/literature along with all the possible adverse reactions/side effects for all the Medicines you take and that have been prescribed to you. Take any new Medicines after you have completely understood and accpet all the possible adverse reactions/side effects.   Do not drive, operate heavy machinery until you have seen by your Cardiologist and advised to do so again.        Person Under Monitoring Name: Corey Tyler  Location: [place] [address]   Infection Prevention Recommendations for  Individuals Confirmed to have, or Being Evaluated for, 2019 Novel Coronavirus (COVID-19) Infection Who Receive Care at Home  Individuals who are confirmed to have, or are being evaluated for, COVID-19 should follow the prevention steps below until a healthcare provider or local or state health department says they can return to normal activities.  Stay home except to get medical care You should restrict activities outside your home, except for getting medical care. Do not go to work, school, or public areas, and do not use public transportation or taxis.  Call ahead before visiting your doctor Before your medical appointment, call the healthcare provider and tell them that you have, or are being evaluated for, COVID-19 infection. This will help the healthcare provider's office take steps to keep other people from getting infected. Ask your healthcare provider to call the local or state health department.  Monitor your symptoms Seek prompt medical attention if your illness is worsening (e.g., difficulty breathing). Before going to your medical appointment, call the healthcare provider and tell them that you have, or are being evaluated for, COVID-19 infection. Ask your healthcare provider to call the local or state health department.  Wear a facemask You should wear a facemask that covers your nose and mouth when you are in the same room with other people and when you visit a healthcare provider. People who live with or visit you should also wear a facemask while they are in the same room with you.  Separate yourself from other people in your home As much as possible, you  should stay in a different room from other people in your home. Also, you should use a separate bathroom, if available.  Avoid sharing household items You should not share dishes, drinking glasses, cups, eating utensils, towels, bedding, or other items with other people in your home. After using these items, you  should wash them thoroughly with soap and water.  Cover your coughs and sneezes Cover your mouth and nose with a tissue when you cough or sneeze, or you can cough or sneeze into your sleeve. Throw used tissues in a lined trash can, and immediately wash your hands with soap and water for at least 20 seconds or use an alcohol-based hand rub.  Wash your [facility] your hands often and thoroughly with soap and water for at least 20 seconds. You can use an alcohol-based hand sanitizer if soap and water are not available and if your hands are not visibly dirty. Avoid touching your eyes, nose, and mouth with unwashed hands.   Prevention Steps for Caregivers and Household Members of Individuals Confirmed to have, or Being Evaluated for, COVID-19 Infection Being Cared for in the Home  If you live with, or provide care at home for, a person confirmed to have, or being evaluated for, COVID-19 infection please follow these guidelines to prevent infection:  Follow healthcare provider's instructions Make sure that you understand and can help the patient follow any healthcare provider instructions for all care.  Provide for the patient's basic needs You should help the patient with basic needs in the home and provide support for getting groceries, prescriptions, and other personal needs.  Monitor the patient's symptoms If they are getting sicker, call his or her medical provider and tell them that the patient has, or is being evaluated for, COVID-19 infection. This will help the healthcare provider's office take steps to keep other people from getting infected. Ask the healthcare provider to call the local or state health department.  Limit the number of people who have contact with the patient  If possible, have only one caregiver for the patient.  Other household members should stay in another home or place of residence. If this is not possible, they should stay  in another room, or be  separated from the patient as much as possible. Use a separate bathroom, if available.  Restrict visitors who do not have an essential need to be in the home.  Keep older adults, very young children, and other sick people away from the patient Keep older adults, very young children, and those who have compromised immune systems or chronic health conditions away from the patient. This includes people with chronic heart, lung, or kidney conditions, diabetes, and cancer.  Ensure good ventilation Make sure that shared spaces in the home have good air flow, such as from an air conditioner or an opened window, weather permitting.  Wash your hands often  Wash your hands often and thoroughly with soap and water for at least 20 seconds. You can use an alcohol based hand sanitizer if soap and water are not available and if your hands are not visibly dirty.  Avoid touching your eyes, nose, and mouth with unwashed hands.  Use disposable paper towels to dry your hands. If not available, use dedicated cloth towels and replace them when they become wet.  Wear a facemask and gloves  Wear a disposable facemask at all times in the room and gloves when you touch or have contact with the patient's blood, body fluids, and/or secretions  or excretions, such as sweat, saliva, sputum, nasal mucus, vomit, urine, or feces.  Ensure the mask fits over your nose and mouth tightly, and do not touch it during use.  Throw out disposable facemasks and gloves after using them. Do not reuse.  Wash your hands immediately after removing your facemask and gloves.  If your personal clothing becomes contaminated, carefully remove clothing and launder. Wash your hands after handling contaminated clothing.  Place all used disposable facemasks, gloves, and other waste in a lined container before disposing them with other household waste.  Remove gloves and wash your hands immediately after handling these items.  Do not share  dishes, glasses, or other household items with the patient  Avoid sharing household items. You should not share dishes, drinking glasses, cups, eating utensils, towels, bedding, or other items with a patient who is confirmed to have, or being evaluated for, COVID-19 infection.  After the person uses these items, you should wash them thoroughly with soap and water.  Wash laundry thoroughly  Immediately remove and wash clothes or bedding that have blood, body fluids, and/or secretions or excretions, such as sweat, saliva, sputum, nasal mucus, vomit, urine, or feces, on them.  Wear gloves when handling laundry from the patient.  Read and follow directions on labels of laundry or clothing items and detergent. In general, wash and dry with the warmest temperatures recommended on the label.  Clean all areas the individual has used often  Clean all touchable surfaces, such as counters, tabletops, doorknobs, bathroom fixtures, toilets, phones, keyboards, tablets, and bedside tables, every day. Also, clean any surfaces that may have blood, body fluids, and/or secretions or excretions on them.  Wear gloves when cleaning surfaces the patient has come in contact with.  Use a diluted bleach solution (e.g., dilute bleach with 1 part bleach and 10 parts water) or a household disinfectant with a label that says EPA-registered for coronaviruses. To make a bleach solution at home, add 1 tablespoon of bleach to 1 quart (4 cups) of water. For a larger supply, add  cup of bleach to 1 gallon (16 cups) of water.  Read labels of cleaning products and follow recommendations provided on product labels. Labels contain instructions for safe and effective use of the cleaning product including precautions you should take when applying the product, such as wearing gloves or eye protection and making sure you have good ventilation during use of the product.  Remove gloves and wash hands immediately after  cleaning.  Monitor yourself for signs and symptoms of illness Caregivers and household members are considered close contacts, should monitor their health, and will be asked to limit movement outside of the home to the extent possible. Follow the monitoring steps for close contacts listed on the symptom monitoring form.   ? If you have additional questions, contact your local health department or call the epidemiologist on call at [phone] (available 24/7). ? This guidance is subject to change. For the most up-to-date guidance from [facility], please refer to their website: YouBlogs.pl   ====================================================================================================  Information on my medicine - ELIQUIS (apixaban)  Why was Eliquis prescribed for you? Eliquis was prescribed for you to reduce the risk of a blood clot forming that can cause a stroke if you have a medical condition called atrial fibrillation (a type of irregular heartbeat).  What do You need to know about Eliquis ? Take your Eliquis TWICE DAILY - one tablet in the morning and one tablet in the evening with or without  food. If you have difficulty swallowing the tablet whole please discuss with your pharmacist how to take the medication safely.  Take Eliquis exactly as prescribed by your doctor and DO NOT stop taking Eliquis without talking to the doctor who prescribed the medication.  Stopping may increase your risk of developing a stroke.  Refill your prescription before you run out.  After discharge, you should have regular check-up appointments with your healthcare provider that is prescribing your Eliquis.  In the future your dose may need to be changed if your kidney function or weight changes by a significant amount or as you get older.  What do you do if you miss a dose? If you miss a dose, take it as soon as you remember on the  same day and resume taking twice daily.  Do not take more than one dose of ELIQUIS at the same time to make up a missed dose.  Important Safety Information A possible side effect of Eliquis is bleeding. You should call your healthcare provider right away if you experience any of the following: ? Bleeding from an injury or your nose that does not stop. ? Unusual colored urine (red or dark brown) or unusual colored stools (red or black). ? Unusual bruising for unknown reasons. ? A serious fall or if you hit your head (even if there is no bleeding).  Some medicines may interact with Eliquis and might increase your risk of bleeding or clotting while on Eliquis. To help avoid this, consult your healthcare provider or pharmacist prior to using any new prescription or non-prescription medications, including herbals, vitamins, non-steroidal anti-inflammatory drugs (NSAIDs) and supplements.  This website has more information on Eliquis (apixaban): http://www.eliquis.com/eliquis/home

## 2020-12-11 NOTE — Discharge Summary (Signed)
Corey Tyler WIO:[id number] DOB: 1941/03/06 DOA: 12/09/2020  PCP: [name], MD  Admit date: 12/09/2020  Discharge date: 12/11/2020  Admitted From: Home   Disposition:  Home   Recommendations for Outpatient Follow-up:   Follow up with PCP in 1-2 weeks  PCP Please obtain BMP/CBC, 2 view CXR in 1week,  (see Discharge instructions)   PCP Please follow up on the following pending results:    Home Health: PT,OT,RN   Equipment/Devices: rolling walker  Consultations:Cards, EP Discharge Condition: Stable    CODE STATUS: Full    Diet Recommendation: Heart Healthy with 1.5lit/day total fluid restriction    Chief Complaint  Patient presents with  . Loss of Consciousness     Brief history of present illness from the day of admission and additional interim summary    Corey Tyler a 80 y.o.malewith medical history significant ofchronic systolic CHF with LVEF 25 to 30%, status post AICD, chronic oxygen dependent 3 to 4 L around-the-clock, COPD, chronic A. fib on Eliquis,CKD stage II, presented with syncope with AICD firing, according to the wife patient does run low blood pressure from time to time at home and gets lightheaded but has never passed out before.  He was also having a low-grade fever and cough at home for the last few days, he has taken 2 COVID-vaccine shots but not the booster.  In the ER he was diagnosed with COVID-19 infection, admitted for treatment of COVID-19 and syncope evaluation.                                                                 Hospital Course   1. Chronic Hypoxic Resp. Failure due to CHF/COPD on 3-5 lit o2 at home - with early Acute Covid 19 infection, have subjective fevers and mild cough few days prior to ER visit and syncope.  Inflammatory markers especially CRP wasquite  elevated, chest x-ray was inconclusive, it was decided that he will get a short course of steroids and Remdesivir, he is clinically stable and continues to be on 3 L nasal cannula oxygen, will at this time provide him with a Medrol Dosepak and discharge him home, he was not happy staying in the hospital at all, at this time he will be discharged with close outpatient PCP follow-up, he has been requested to seek immediate help if his shortness of breath gets worse.  Encouraged the patient to sit up in chair in the daytime use I-S and flutter valve for pulmonary toiletry and then prone in bed when at night.  Will advance activity and titrate down oxygen as possible.  SpO2: 94 % O2 Flow Rate (L/min): 3 L/min  Recent Labs  Lab 12/09/20 1132 12/09/20 1141 12/09/20 1645 12/10/20 0602 12/10/20 1944 12/11/20 0540  WBC 5.8  --   --  3.5*  --  6.6  CRP  --   --  15.2* 14.7*  --  8.3*  DDIMER  --   --  0.39 0.27  --  <0.27  BNP  --  81.8 93.6  --  146.8* 129.8*  PROCALCITON  --   --  <0.10  --  <0.10 <0.10  AST  --   --   --  45*  --   --   ALT  --   --   --  63*  --   --   ALKPHOS  --   --   --  48  --   --   BILITOT  --   --   --  0.8  --   --   ALBUMIN  --   --   --  2.7*  --   --       2. Syncope with possible NSVT and AICD discharge in a patient with chronic systolic heart failure EF around 25%.  Cardiology and EP following, electrolytes stable, recent exposure to azithromycin question if this could have precipitated it.  Continue beta-blocker and amiodarone, amiodarone dose increased per EP recommendation, will discharge home, seen by PT OT this admission, will get home health PT along with a rolling walker, requested not to drive till he is cleared by EP next visit.  3. Underlying COPD with chronic hypoxic respiratory failure.  Supportive care.   4.  Chronic systolic heart failure EF 25% cardiomyopathy.  Supportive care.  5.  Paroxysmal atrial fibrillation [place] vas 2 score of 5.   Continue beta-blocker and Eliquis.  48. Fall with fracture of right first metatarsal.  In a splint/CAM boot.  PT OT and outpatient PCP and Ortho follow-up outpatient in 1 week.  7.  BPH.  On Flomax continue.    Discharge diagnosis     Active Problems:   Syncope    Discharge instructions    Discharge Instructions    Diet - low sodium heart healthy   Complete by: As directed    Discharge instructions   Complete by: As directed    Follow with Primary MD [name], MD your primary cardiologist within a week    Get CBC, CMP, 2 view Chest X ray -  checked next visit within 1 week by Primary MD    Activity: As tolerated with Full fall precautions use walker/cane & assistance as needed, wear the cam boot in the right foot at all times except when taking shower.  Disposition Home    Diet: Heart Healthy    Check your Weight same time everyday, if you gain over 2 pounds, or you develop in leg swelling, experience more shortness of breath or chest pain, call your Primary MD immediately. Follow Cardiac Low Salt Diet and 1.5 lit/day fluid restriction.  Special Instructions: If you have smoked or chewed Tobacco  in the last 2 yrs please stop smoking, stop any regular Alcohol  and or any Recreational drug use.  On your next visit with your primary care physician please Get Medicines reviewed and adjusted.  Please request your Prim.MD to go over all Hospital Tests and Procedure/Radiological results at the follow up, please get all Hospital records sent to your Prim MD by signing hospital release before you go home.  If you experience worsening of your admission symptoms, develop shortness of breath, life threatening emergency, suicidal or homicidal thoughts you must seek medical attention immediately by calling 911 or calling your MD immediately  if symptoms less severe.  You Must read complete instructions/literature along with all the possible adverse reactions/side effects for all the  Medicines you take and that have been prescribed to you. Take any new Medicines after you have completely understood and accpet all the possible adverse reactions/side effects.   Do not drive, operate heavy machinery until you have seen by your Cardiologist and advised to do so again.   Increase activity slowly   Complete by: As directed       Discharge Medications   Allergies as of 12/11/2020      Reactions   Atorvastatin Shortness Of Breath, Cough   Statins Shortness Of Breath, Other (See Comments)   Muscle and joing pain and "These bother my lungs and make my gait unsteady, also"   Xarelto [rivaroxaban] Other (See Comments)   Bleeding   Adhesive [tape] Hives, Itching, Rash   Codeine Nausea Only   Isosorbide Nitrate Other (See Comments)   Made him feel bad   Latex Itching, Other (See Comments)   Rash, itching, burning   Levofloxacin Other (See Comments)   Tachycardia   Lisinopril Cough   Lorazepam Other (See Comments)   "felt bad"   Mexiletine Other (See Comments)   GI distress   Sertraline Other (See Comments)   Made him feel bad      Medication List    STOP taking these medications   azithromycin 250 MG tablet Commonly known as: ZITHROMAX     TAKE these medications   acetaminophen 500 MG tablet Commonly known as: TYLENOL Take 500 mg by mouth every 6 (six) hours as needed for headache (pain).   albuterol 108 (90 Base) MCG/ACT inhaler Commonly known as: VENTOLIN HFA Inhale 2 puffs into the lungs every 6 (six) hours as needed for wheezing or shortness of breath.   albuterol (2.5 MG/3ML) 0.083% nebulizer solution Commonly known as: PROVENTIL Take 2.5 mg by nebulization every 6 (six) hours as needed for wheezing or shortness of breath.   amiodarone 200 MG tablet Commonly known as: PACERONE Take 1 tablet (200 mg total) by mouth 2 (two) times daily. What changed: when to take this   budesonide-formoterol 160-4.5 MCG/ACT inhaler Commonly known as:  SYMBICORT Inhale 2 puffs into the lungs 2 (two) times daily.   cholecalciferol 1000 units tablet Commonly known as: VITAMIN D Take 1,000 Units by mouth daily.   Eliquis 5 MG Tabs tablet Generic drug: apixaban TAKE 1 TABLET BY MOUTH TWICE A DAY What changed: how much to take   finasteride 5 MG tablet Commonly known as: PROSCAR Take 5 mg by mouth daily.   Fish Oil 1000 MG Cpdr Take 1,000 mg by mouth daily.   levothyroxine 125 MCG tablet Commonly known as: SYNTHROID Take 125 mcg by mouth daily before breakfast.   LUBRICATING EYE DROPS OP Place 1 drop into both eyes daily.   MAGNESIUM PO Take 420 mg by mouth at bedtime.   melatonin 3 MG Tabs tablet Take 3 mg by mouth at bedtime.   METAMUCIL PO Take 15 mLs by mouth daily. Mix in liquid and drink   methylPREDNISolone 4 MG Tbpk tablet Commonly known as: MEDROL DOSEPAK follow package directions   metoprolol succinate 25 MG 24 hr tablet Commonly known as: Toprol XL TAKE 1/2 TABLET DAILY What changed:   how much to take  how to take this  when to take this  additional instructions   multivitamin with minerals tablet Take 1 tablet by mouth daily. Mens 50 plus  nitroGLYCERIN 0.4 MG SL tablet Commonly known as: NITROSTAT Place 0.4 mg under the tongue every 5 (five) minutes as needed for chest pain.   OXYGEN Inhale 3-4 L into the lungs See admin instructions. 3 L at rest and 4 L with exertion   potassium chloride 10 MEQ CR capsule Commonly known as: MICRO-K Take 10 mEq by mouth at bedtime.   promethazine 25 MG tablet Commonly known as: PHENERGAN Take 25 mg by mouth daily as needed for nausea or vomiting.   ranolazine 1000 MG SR tablet Commonly known as: RANEXA Take 1 tablet (1,000 mg total) by mouth 2 (two) times daily.   sodium chloride 0.65 % Soln nasal spray Commonly known as: OCEAN Place 1 spray into both nostrils daily as needed for congestion.   Spiriva Respimat 2.5 MCG/ACT Aers Generic drug:  Tiotropium Bromide Monohydrate Inhale 2 puffs into the lungs daily.   tamsulosin 0.4 MG Caps capsule Commonly known as: FLOMAX Take 0.4 mg by mouth at bedtime.   torsemide 20 MG tablet Commonly known as: DEMADEX Take 1 tablet by mouth daily and 1/2 tablet as needed What changed:   how much to take  how to take this  when to take this  additional instructions            Durable Medical Equipment  (From admission, onward)         Start     Ordered   12/11/20 0715  For home use only DME Walker rolling  Once       Comments: 5 wheel  Question Answer Comment  Walker: With [employer]   Patient needs a walker to treat with the following condition Weakness      12/11/20 0715           Follow-up Information    Baldwin Jamaica, PA-C Follow up.   Specialty: Cardiology Why: 12/19/20 @ 8:45AM, for Dr. Margurite Auerbach information: [address] [place] [address] [phone]        [name], MD. Schedule an appointment as soon as possible for a visit in 1 week(s).   Specialty: Internal Medicine Contact information: [address] [place] [address] [phone]        Minus Breeding, MD .   Specialty: Cardiology Contact information: [address] [place] [address] [phone]        Deboraha Sprang, MD .   Specialty: Cardiology Contact information: [phone] [address] [place] [address] [phone]        Newt Minion, MD Follow up.   Specialty: Orthopedic Surgery Why: R. foot injury Contact information: [address] [place]  [address] [phone]               Major procedures and Radiology Reports - PLEASE review detailed and final reports thoroughly  -       DG Chest 2 View  Result Date: 12/09/2020 CLINICAL DATA:  Syncope EXAM: CHEST - 2 VIEW COMPARISON:  December 29, 2019 FINDINGS: Ill-defined opacity in the left base is suspicious for pneumonia.  There is underlying interstitial thickening throughout the lungs. Heart is upper normal in size with pulmonary vascularity normal. Pacemaker leads attached to right atrium and right ventricle. No adenopathy. There is aortic atherosclerosis. No bone lesions. IMPRESSION: Ill-defined opacity left base concerning for pneumonia. Underlying interstitial thickening likely represents a degree of chronic bronchitis. Heart upper normal in size with pacemaker leads attached to right  atrium and right ventricle. Aortic Atherosclerosis (ICD10-I70.0). Electronically Signed   By: Lowella Grip III M.D.   On: 12/09/2020 15:02   CT Head Wo Contrast  Result Date: 12/09/2020 CLINICAL DATA:  Head trauma, minor (Age >= 65y) EXAM: CT HEAD WITHOUT CONTRAST TECHNIQUE: Contiguous axial images were obtained from the base of the skull through the vertex without intravenous contrast. COMPARISON:  07/29/2019 and prior. FINDINGS: Brain: No acute infarct or intracranial hemorrhage. No mass lesion. No midline shift, ventriculomegaly or extra-axial fluid collection. Mild cerebral volume loss with ex vacuo dilatation. Chronic microvascular ischemic changes. Vascular: No hyperdense vessel or unexpected calcification. Skull: Negative for fracture or focal lesion. Sinuses/Orbits: Sequela of bilateral lens replacement. Mild ethmoid and maxillary sinus mucosal thickening. No mastoid effusion. Other: None. IMPRESSION: No acute intracranial process. Mild cerebral atrophy and chronic microvascular ischemic changes. Electronically Signed   By: Primitivo Gauze M.D.   On: 12/09/2020 12:25   DG Knee Complete 4 Views Left  Result Date: 12/09/2020 CLINICAL DATA:  Left knee pain after fall. EXAM: LEFT KNEE - COMPLETE 4+ VIEW COMPARISON:  None. FINDINGS: No evidence of fracture, dislocation, or joint effusion. No evidence of arthropathy or other focal bone abnormality. Soft tissues are unremarkable. IMPRESSION: Negative. Electronically Signed   By:  Marijo Conception M.D.   On: 12/09/2020 12:47   DG Foot Complete Right  Result Date: 12/09/2020 CLINICAL DATA:  Right foot pain after fall. EXAM: RIGHT FOOT COMPLETE - 3+ VIEW COMPARISON:  Apr 09, 2016. FINDINGS: Mildly displaced fracture is seen involving the sesamoid bone adjacent to the distal portion of the first metatarsal. There is no evidence of arthropathy or other focal bone abnormality. Soft tissues are unremarkable. IMPRESSION: Mildly displaced sesamoid bone adjacent to distal portion of first metatarsal. Electronically Signed   By: Marijo Conception M.D.   On: 12/09/2020 12:47   ECHOCARDIOGRAM COMPLETE  Result Date: 12/10/2020    ECHOCARDIOGRAM REPORT   Patient Name:   Corey Tyler Date of Exam: 12/10/2020 Medical Rec #:  [id number]      Height:       73.0 in Accession #:    [id number]     Weight:       200.0 lb Date of Birth:  [birth year]      BSA:          2.152 m Patient Age:    10 years       BP:           114/65 mmHg Patient Gender: M              HR:           60 bpm. Exam Location:  Inpatient Procedure: 2D Echo, Cardiac Doppler and Color Doppler Indications:    Syncope  History:        Patient has prior history of Echocardiogram examinations, most                 recent 08/18/2019. CHF, Defibrillator, COPD; Arrythmias:Atrial                 Fibrillation. NICM.  Sonographer:    Clayton Lefort RDCS (AE) Referring Phys: [id number] Lequita Halt  Sonographer Comments: Image acquisition challenging due to respiratory motion. IMPRESSIONS  1. Akinesis of the basal inferolateral wall with overall moderate to severe LV dysfunction.  2. Left ventricular ejection fraction, by estimation, is 30 to 35%. The left ventricle has moderate to severely decreased function. The left ventricle  demonstrates regional wall motion abnormalities (see scoring diagram/findings for description). The left ventricular internal cavity size was mildly dilated. There is mild left ventricular hypertrophy. Left ventricular diastolic  parameters are consistent with Grade I diastolic dysfunction (impaired relaxation).  3. Right ventricular systolic function is normal. The right ventricular size is normal.  4. Left atrial size was mildly dilated.  5. The mitral valve is normal in structure. Mild mitral valve regurgitation. No evidence of mitral stenosis.  6. The aortic valve is tricuspid. Aortic valve regurgitation is mild. Mild aortic valve sclerosis is present, with no evidence of aortic valve stenosis.  7. Aortic dilatation noted. There is borderline dilatation of the ascending aorta, measuring 38 mm.  8. The inferior vena cava is normal in size with greater than 50% respiratory variability, suggesting right atrial pressure of 3 mmHg. FINDINGS  Left Ventricle: Left ventricular ejection fraction, by estimation, is 30 to 35%. The left ventricle has moderate to severely decreased function. The left ventricle demonstrates regional wall motion abnormalities. The left ventricular internal cavity size was mildly dilated. There is mild left ventricular hypertrophy. Left ventricular diastolic parameters are consistent with Grade I diastolic dysfunction (impaired relaxation). Right Ventricle: The right ventricular size is normal.Right ventricular systolic function is normal. Left Atrium: Left atrial size was mildly dilated. Right Atrium: Right atrial size was normal in size. Pericardium: There is no evidence of pericardial effusion. Mitral Valve: The mitral valve is normal in structure. Mild mitral valve regurgitation. No evidence of mitral valve stenosis. Tricuspid Valve: The tricuspid valve is normal in structure. Tricuspid valve regurgitation is mild . No evidence of tricuspid stenosis. Aortic Valve: The aortic valve is tricuspid. Aortic valve regurgitation is mild. Aortic regurgitation PHT measures 592 msec. Mild aortic valve sclerosis is present, with no evidence of aortic valve stenosis. Pulmonic Valve: The pulmonic valve was normal in structure.  Pulmonic valve regurgitation is trivial. No evidence of pulmonic stenosis. Aorta: Aortic dilatation noted. There is borderline dilatation of the ascending aorta, measuring 38 mm. Venous: The inferior vena cava is normal in size with greater than 50% respiratory variability, suggesting right atrial pressure of 3 mmHg. IAS/Shunts: No atrial level shunt detected by color flow Doppler. Additional Comments: Akinesis of the basal inferolateral wall with overall moderate to severe LV dysfunction. A pacer wire is visualized.  LEFT VENTRICLE PLAX 2D LVIDd:         5.70 cm      Diastology LVIDs:         4.80 cm      LV e' medial:    5.44 cm/s LV PW:         1.40 cm      LV E/e' medial:  10.0 LV IVS:        1.30 cm      LV e' lateral:   8.27 cm/s                             LV E/e' lateral: 6.6  LV Volumes (MOD) LV vol d, MOD A2C: 164.0 ml LV vol d, MOD A4C: 191.0 ml LV vol s, MOD A2C: 104.0 ml LV vol s, MOD A4C: 97.4 ml LV SV MOD A2C:     60.0 ml LV SV MOD A4C:     191.0 ml LV SV MOD BP:      78.5 ml RIGHT VENTRICLE            IVC RV Basal diam:  3.80 cm    IVC diam: 1.70 cm RV Mid diam:    3.20 cm RV S prime:     7.83 cm/s TAPSE (M-mode): 2.2 cm LEFT ATRIUM             Index       RIGHT ATRIUM           Index LA diam:        3.90 cm 1.81 cm/m  RA Area:     22.00 cm LA Vol (A2C):   85.3 ml 39.64 ml/m RA Volume:   75.20 ml  34.95 ml/m LA Vol (A4C):   69.2 ml 32.16 ml/m LA Biplane Vol: 82.1 ml 38.16 ml/m  AORTIC VALVE LVOT Vmax:   84.90 cm/s LVOT Vmean:  60.200 cm/s LVOT VTI:    0.186 m AI PHT:      592 msec  AORTA Ao Asc diam: 3.80 cm MITRAL VALVE                 TRICUSPID VALVE MV Area (PHT): 4.89 cm      TR Peak grad:   38.9 mmHg MV Decel Time: 155 msec      TR Vmax:        312.00 cm/s MR Peak grad:    119.2 mmHg MR Mean grad:    73.0 mmHg   SHUNTS MR Vmax:         546.00 cm/s Systemic VTI: 0.19 m MR Vmean:        396.0 cm/s MR PISA:         1.57 cm MR PISA Eff ROA: 11 mm MR PISA Radius:  0.50 cm MV E velocity:  54.40 cm/s MV A velocity: 98.50 cm/s MV E/A ratio:  0.55 Kirk Ruths MD Electronically signed by Kirk Ruths MD Signature Date/Time: 12/10/2020/4:55:17 PM    Final    CUP PACEART REMOTE DEVICE CHECK  Result Date: 11/19/2020 Scheduled remote reviewed. Normal device function.  Battery at [facility], triggered 11/11/20 (known, reviewed as alert notification). Generator replacement scheduled 12/04/20. [place]- Eliquis Next remote 91 days- [employer], RN/CVRS   Micro Results     Recent Results (from the past 240 hour(s))  SARS CORONAVIRUS 2 (TAT 6-24 HRS) Nasopharyngeal Nasopharyngeal Swab     Status: Abnormal   Collection Time: 12/03/20  1:05 PM   Specimen: Nasopharyngeal Swab  Result Value Ref Range Status   SARS Coronavirus 2 POSITIVE (A) NEGATIVE Final    Comment: (NOTE) SARS-CoV-2 target nucleic acids are DETECTED.  The SARS-CoV-2 RNA is generally detectable in upper and lower respiratory specimens during the acute phase of infection. Positive results are indicative of the presence of SARS-CoV-2 RNA. Clinical correlation with patient history and other diagnostic information is  necessary to determine patient infection status. Positive results do not rule out bacterial infection or co-infection with other viruses.  The expected result is Negative.  Fact Sheet for Patients: SugarRoll.be  Fact Sheet for Healthcare Providers: https://www.woods-mathews.com/  This test is not yet approved or cleared by the [place] FDA and  has been authorized for detection and/or diagnosis of SARS-CoV-2 by FDA under an Emergency Use Authorization (EUA). This EUA will remain  in effect (meaning this test can be used) for the duration of the COVID-19 declaration under Section 564(b)(1) of the Act, 21 U. S.C. section 360bbb-3(b)(1), unless the authorization is terminated or revoked sooner.   Performed at [facility], [place] [address], [place],  [place] [address]     Today  Subjective    Corey Tyler today has no headache,no chest abdominal pain,no new weakness tingling or numbness, feels much better wants to go home today.    Objective   Blood pressure 131/66, pulse 63, temperature 99.5 F (37.5 C), temperature source Oral, resp. rate (!) 21, height 6\' 1"  (1.854 m), weight 93.4 kg, SpO2 94 %.   Intake/Output Summary (Last 24 hours) at 12/11/2020 0955 Last data filed at 12/11/2020 0600 Gross per 24 hour  Intake -  Output 600 ml  Net -600 ml    Exam  Awake Alert, No new F.N deficits, Normal affect [place].AT,PERRAL Supple Neck,No JVD, No cervical lymphadenopathy appriciated.  Symmetrical Chest wall movement, Good air movement bilaterally, CTAB RRR,No Gallops,Rubs or new Murmurs, No Parasternal Heave +ve B.Sounds, Abd Soft, Non tender, No organomegaly appriciated, No rebound -guarding or rigidity. No Cyanosis, wearing a cam boot on the right foot   Data Review   CBC w Diff:  Lab Results  Component Value Date   WBC 6.6 12/11/2020   HGB 11.7 (L) 12/11/2020   HGB 12.6 (L) 08/20/2020   HCT 35.6 (L) 12/11/2020   HCT 38.0 08/20/2020   PLT 151 12/11/2020   PLT 168 08/20/2020   LYMPHOPCT 10 12/11/2020   MONOPCT 10 12/11/2020   EOSPCT 0 12/11/2020   BASOPCT 0 12/11/2020    CMP:  Lab Results  Component Value Date   NA 133 (L) 12/10/2020   NA 137 08/20/2020   K 4.3 12/10/2020   CL 98 12/10/2020   CO2 25 12/10/2020   BUN 22 12/10/2020   BUN 15 08/20/2020   CREATININE 1.20 12/10/2020   PROT 5.5 (L) 12/10/2020   PROT 5.8 (L) 08/20/2020   ALBUMIN 2.7 (L) 12/10/2020   ALBUMIN 3.7 08/20/2020   BILITOT 0.8 12/10/2020   BILITOT 0.5 08/20/2020   ALKPHOS 48 12/10/2020   AST 45 (H) 12/10/2020   ALT 63 (H) 12/10/2020  .   Total Time in preparing paper work, data evaluation and todays exam - 40 minutes  Lala Lund M.D on 12/11/2020 at 9:55 AM  [facility]

## 2020-12-11 NOTE — TOC Transition Note (Signed)
Transition of Care [facility]) - CM/SW Discharge Note   Patient Details  Name: Corey Tyler MRN: [id number] Date of Birth: 1941-10-06  Transition of Care [facility]) CM/SW Contact:  Zenon Mayo, RN Phone Number: 12/11/2020, 11:51 AM   Clinical Narrative:    Patient is for dc today, NCM asked if could set up HHPT for him, he states he does not want HHPT .  He states he gets around good at home, he has a walker and an Transport planner, and a scale which he weighs him self daily.  His wife takes him to his MD apts. He has no issues with getting his medications.     Final next level of care: Home/Self Care Barriers to Discharge: No Barriers Identified   Patient Goals and CMS Choice Patient states their goals for this hospitalization and ongoing recovery are:: get better   Choice offered to / list presented to : NA  Discharge Placement                       Discharge Plan and Services In-house Referral: NA Discharge Planning Services: CM Consult Post Acute Care Choice: NA            DME Agency: NA       HH Arranged: Refused HH          Social Determinants of Health (SDOH) Interventions Food Insecurity Interventions: Intervention Not Indicated Transportation Interventions: Intervention Not Indicated   Readmission Risk Interventions Readmission Risk Prevention Plan 12/11/2020  Transportation Screening Complete  PCP or Specialist Appt within 5-7 Days Complete  Home Care Screening Complete  Some recent data might be hidden

## 2020-12-13 ENCOUNTER — Telehealth: Payer: Self-pay | Admitting: Internal Medicine

## 2020-12-13 NOTE — Telephone Encounter (Signed)
Patient calling to speak with Rosann Auerbach in regards to all of his medication. Did not want me to take a message, states he needs her to call him before she leaves.

## 2020-12-13 NOTE — Telephone Encounter (Signed)
Spoke with pt who states he was discharged from hospital on Amiodarone 200mg  - 1 tablet by mouth twice daily and Metoprolol 25mg  1 tablet daily.  Pt states he cannot tolerated this amount of medication due to fatigue and weakness.  Pt states he is going back to Amiodarone 200mg  - 1 tablet by mouth in the am and Metoprolol - 1/2 tablet by mouth daily.  Reviewed ED precautions with pt and advised will forward to Dr [name] for review and recommendation.  Pt is scheduled for post hospital f/u with Tommye Standard 12/19/2020.  Pt verbalizes understanding and agrees with current plan

## 2020-12-16 NOTE — Progress Notes (Signed)
Cardiology Office Note Date:  12/16/2020  Patient ID:  Corey Tyler, Corey Tyler Aug 11, 1941, MRN [id number] PCP:  [name], MD  Cardiologist:  Dr. Percival Spanish Electrophysiologist: Dr. [name] Pulmonologist: Dr. Gwenette Greet ([place])    Chief Complaint:  Planned EP follow up  History of Present Illness: Corey Tyler is a 80 y.o. male with history of Paroxysmal AFib, NICM w/ICD, chronic CHF (systolic), VT (polymorphic in 2014), COPD is O2 dependent  He was admitted to [facility] 11/02/16 with c/o weakness noted in monomorphic VT below his detection rate, his device reprogrammed and his home amiodarone re-bolused, discharged 11/04/16 on 400mg  BID with plans to schedule EST to make sure his sinus rate does not get above his detection rate.    He comes in today to be seen for Dr. [name] who last saw him in March 2021.  Discussed recent need for discontinueation and reduction of a number of his medicines 2/2 orthostatic dizziness He had gotten ATP for VT (Feb 2021) Amiodarone continued despite pulm hx  More recently he saw Dr. Percival Spanish 05/14/20 Know hypotension, mentions moves slowly 2/2 this, no reports of syncope.  He was taking lopresor once daily and was changed to metoprolol succ. Planned to update his BMET   I saw him Sept 2021 He is with his wife today, he comes with a walker on O2. He denies any CP, palpitations, no dizzy spells, or syncope, no shocks He has baseline SOB/DOE, this is unchanged y his account, no unusual SOB or DOE, no symptoms of orthopnea or PND He has known hx of orthostatic dizziness that is bothersome to him, particularly when he needs to urinate with some urgency. He has not fainted or fallen. Both he and his wife are very knowledgeable on the difficult balance of volume management to avoid volume OL and dehydration. He thinks he is still taking metoprolol tartrate, his wife seems surprised, they aren't sure, but is taking a metoprolol 1/2 tab daily. He reports his weight at hom by  his scale 200, and his weights range from 200-205lbs He does not feel like he is retaining right now. He denies any bleeding or signs of bleeding, has bruises on his arms, none otherwise He sees Dr. Percival Spanish next week as well as his PMD who will be doing his annual labs. No arrhythmias, no changes were made.  He reached ERI 11/11/20 and planned for gen change though his pre-procedure COVID test came + and deferred.  He was admitted to [facility] 12/09/20 for VT/syncope, device therapy Device transmission (12/09/20) presenting rhythm is AS/VS Battery ERI reached 11/11/20 Lead measurements are good One VT episode 12/08/20 @ 11;12AM PMVT  > ATP > MMVT >ATP  > shock successful No other arrhythmias Labs were OK Suspect perhaps triggered by his COVID illness noting his inflammatory markers were quite elevated, and did report mild symptoms of illness and low grade ever at home,  his amiodarone increased to 200mg  BID at least for the short term. Discharged 1/19/2 with a short course of steroids and Remdesivir  Subsequent phone note reports unable to tolerate BID amiodarone 2/2 fatigue and self reduced back to daily and his metoprolol to 1/2 tab daily  TODAY He is back to baseline, states that he may have felt a little more congested then usual, like he had a mild flu illness.   Back to his baseline respiratory status, uses Home O2 at baseline No CP, palpitations prior to or since his VT episodes' No near syncope or syncope since  home, had one very brief dizzy spell. No bleeding or signs of bleeding He broke his foot when he fainted, his wife says foot seemd to get stuck/twisted under the cabinets, planned for the orthopedic boot 83mo.  The BID amiodarone really made him feel fatigued. Better back on his baseline daily dose  He tells me that since his last visit with Dr. Percival Spanish he has been taking the metoprolol 1/2 tab bid and the torsemide 20mg  daily with an extra 1/2 tab on Wed,  Sat/Sun.   Device information: SJM dual chamber ICD, implanted 01/06/12, Dr. [name], primary prevention\ The patient is aware of device alert, industry representative d/w the patient again today he was aware and has been already demonstrated the vibratory alert  VT hx + hx ploymorphic VT with therapy 2014 Monomorphic VT below detection rate Dec 2017 VT ablation 06/10/2017 (Dr. Lovena Le) Nov 2018 VT ATP Feb 2021 ATP/shock appropriate Jan 2022 (in environment of COVID illness)   AAD hx 2017 Amiodarone is current 2018 Ranexa > uptitrated is current Record reports intolerant of Mexiletine (feeling of off balance)  AFib Hx GI bleed on Xarelto    Past Medical History:  Diagnosis Date  . A-fib ([place])    a. Noted on 12/2012 interrogation, placed on Apixaban and ultimately stopped NOACs 2/2 personal decision 8/14.  Marland Kitchen AICD (automatic cardioverter/defibrillator) present    Cardiac defibrillator -dual  [employer] Judes  . Arthritis   . Atrial tachycardia-non sustained    a. Noted on 03/2012 interrogation.  . Chronic systolic heart failure (HCC)    EF down to 20 to 25% per echo November 2012  . COPD (chronic obstructive pulmonary disease) ([place])   . Hyperlipidemia   . Hypertension   . ICD (implantable cardiac defibrillator) in place   . NICM (nonischemic cardiomyopathy) ([place])    a. Normal cors 2000. b. Minimal plaque 2013.  . Old myocardial infarction    a. ?Silent MI. b. Large fixed inferolateral defect c/w with prior infarct, no ischemia. c. Cath in 2000/2013 with only minimal CAD.  Marland Kitchen Presence of permanent cardiac pacemaker   . Pulmonary nodule, right    Last scan in 2010 showing stability; felt to be benign.  Marland Kitchen PVC's (premature ventricular contractions)   . Ventricular tachycardia, polymorphic ([place])    Rx via ICD 12/14    Past Surgical History:  Procedure Laterality Date  . APPENDECTOMY    . CARDIAC CATHETERIZATION  2000  . CARDIAC DEFIBRILLATOR PLACEMENT    . CARDIOVERSION  03/30/2013    Procedure: CARDIOVERSION;  Surgeon: Thayer Headings, MD;  Location: [place];  Service: Cardiovascular;;  . COLON SURGERY    . COLONOSCOPY N/A 09/25/2013   Procedure: COLONOSCOPY;  Surgeon: Jerene Bears, MD;  Location: [facility];  Service: Endoscopy;  Laterality: N/A;  . ICD  12/2011   [name]  . IMPLANTABLE CARDIOVERTER DEFIBRILLATOR IMPLANT N/A 01/06/2012   Procedure: IMPLANTABLE CARDIOVERTER DEFIBRILLATOR IMPLANT;  Surgeon: Deboraha Sprang, MD;  Location: [facility] CATH LAB;  Service: Cardiovascular;  Laterality: N/A;  . PACEMAKER IMPLANT     St [name]  . TEE WITHOUT CARDIOVERSION N/A 03/30/2013   Procedure: TRANSESOPHAGEAL ECHOCARDIOGRAM (TEE);  Surgeon: Thayer Headings, MD;  Location: [place];  Service: Cardiovascular;  Laterality: N/A;  . V TACH ABLATION  06/10/2017  . V TACH ABLATION N/A 06/10/2017   Procedure: V Tach Ablation;  Surgeon: Evans Lance, MD;  Location: [place] CV LAB;  Service: Cardiovascular;  Laterality: N/A;    Current  Outpatient Medications  Medication Sig Dispense Refill  . acetaminophen (TYLENOL) 500 MG tablet Take 500 mg by mouth every 6 (six) hours as needed for headache (pain).    Marland Kitchen albuterol (PROVENTIL) (2.5 MG/3ML) 0.083% nebulizer solution Take 2.5 mg by nebulization every 6 (six) hours as needed for wheezing or shortness of breath.    Marland Kitchen albuterol (VENTOLIN HFA) 108 (90 Base) MCG/ACT inhaler Inhale 2 puffs into the lungs every 6 (six) hours as needed for wheezing or shortness of breath.    Marland Kitchen amiodarone (PACERONE) 200 MG tablet Take 1 tablet (200 mg total) by mouth 2 (two) times daily. 60 tablet 0  . budesonide-formoterol (SYMBICORT) 160-4.5 MCG/ACT inhaler Inhale 2 puffs into the lungs 2 (two) times daily.    . Carboxymethylcellul-Glycerin (LUBRICATING EYE DROPS OP) Place 1 drop into both eyes daily.    . cholecalciferol (VITAMIN D) 1000 UNITS tablet Take 1,000 Units by mouth daily.    Marland Kitchen ELIQUIS 5 MG TABS tablet TAKE 1 TABLET BY MOUTH TWICE A DAY  (Patient taking differently: Take 5 mg by mouth 2 (two) times daily.) 60 tablet 5  . finasteride (PROSCAR) 5 MG tablet Take 5 mg by mouth daily.     Marland Kitchen levothyroxine (SYNTHROID, LEVOTHROID) 125 MCG tablet Take 125 mcg by mouth daily before breakfast.     . MAGNESIUM PO Take 420 mg by mouth at bedtime.    . Melatonin 3 MG TABS Take 3 mg by mouth at bedtime.    . methylPREDNISolone (MEDROL DOSEPAK) 4 MG TBPK tablet follow package directions 21 tablet 0  . metoprolol succinate (TOPROL XL) 25 MG 24 hr tablet TAKE 1/2 TABLET DAILY (Patient taking differently: Take 25 mg by mouth daily.) 45 tablet 3  . Multiple Vitamins-Minerals (MULTIVITAMIN WITH MINERALS) tablet Take 1 tablet by mouth daily. Mens 50 plus    . nitroGLYCERIN (NITROSTAT) 0.4 MG SL tablet Place 0.4 mg under the tongue every 5 (five) minutes as needed for chest pain.     . Omega-3 Fatty Acids (FISH OIL) 1000 MG CPDR Take 1,000 mg by mouth daily.    . OXYGEN Inhale 3-4 L into the lungs See admin instructions. 3 L at rest and 4 L with exertion    . potassium chloride (MICRO-K) 10 MEQ CR capsule Take 10 mEq by mouth at bedtime.    . promethazine (PHENERGAN) 25 MG tablet Take 25 mg by mouth daily as needed for nausea or vomiting.     . Psyllium (METAMUCIL PO) Take 15 mLs by mouth daily. Mix in liquid and drink    . ranolazine (RANEXA) 1000 MG SR tablet Take 1 tablet (1,000 mg total) by mouth 2 (two) times daily. 180 tablet 3  . sodium chloride (OCEAN) 0.65 % SOLN nasal spray Place 1 spray into both nostrils daily as needed for congestion.    . tamsulosin (FLOMAX) 0.4 MG CAPS capsule Take 0.4 mg by mouth at bedtime.    . Tiotropium Bromide Monohydrate (SPIRIVA RESPIMAT) 2.5 MCG/ACT AERS Inhale 2 puffs into the lungs daily.    Marland Kitchen torsemide (DEMADEX) 20 MG tablet Take 1 tablet by mouth daily and 1/2 tablet as needed (Patient taking differently: Take 20-30 mg by mouth See admin instructions. Take one tablet (20 mg) by mouth daily, on Saturday,  Sunday, Wednesday take an additional 1/2 tablet (10 mg) for a total dose of 30 mg on those days) 135 tablet 3   No current facility-administered medications for this visit.    Allergies:  Atorvastatin, Statins, Xarelto [rivaroxaban], Adhesive [tape], Codeine, Isosorbide nitrate, Latex, Levofloxacin, Lisinopril, Lorazepam, Mexiletine, and Sertraline   Social History:  The patient  reports that he quit smoking about 37 years ago. His smoking use included cigarettes. He has a 100.00 pack-year smoking history. He has never used smokeless tobacco. He reports that he does not drink alcohol and does not use drugs.   Family History:  The patient's family history includes Emphysema in his mother; Esophageal cancer in his brother; Heart disease in his mother; Heart failure in his father.  ROS:  Please see the history of present illness.  All other systems are reviewed and otherwise negative.   PHYSICAL EXAM:  VS:  There were no vitals taken for this visit. BMI: There is no height or weight on file to calculate BMI. Well nourished, well developed, in no acute distress  HEENT: normocephalic, atraumatic  Neck: no JVD, carotid bruits or masses Cardiac: RR; no significant murmurs, no rubs, or gallops Lungs:   Somewhat diminished throughout, no wheezing, rhonchi or rales  Abd: soft, nontender MS: no deformity  Ext: no edema  Skin: warm and dry, no rash Neuro:  No gross deficits appreciated Psych: euthymic mood, full affect   ICD site is stable, no tethering or discomfort   EKG:  Done today and reviewed by myself SR 65bpm, no significant changes from last  ICD interrogation done today and reviewed by myself:  Battery reached ERI 11/11/20 Lead measurements are good No new arrhythmias AP 56% VP <1%  12/10/20: TTE IMPRESSIONS  1. Akinesis of the basal inferolateral wall with overall moderate to  severe LV dysfunction.  2. Left ventricular ejection fraction, by estimation, is 30 to 35%. The   left ventricle has moderate to severely decreased function. The left  ventricle demonstrates regional wall motion abnormalities (see scoring  diagram/findings for description). The  left ventricular internal cavity size was mildly dilated. There is mild  left ventricular hypertrophy. Left ventricular diastolic parameters are  consistent with Grade I diastolic dysfunction (impaired relaxation).  3. Right ventricular systolic function is normal. The right ventricular  size is normal.  4. Left atrial size was mildly dilated.  5. The mitral valve is normal in structure. Mild mitral valve  regurgitation. No evidence of mitral stenosis.  6. The aortic valve is tricuspid. Aortic valve regurgitation is mild.  Mild aortic valve sclerosis is present, with no evidence of aortic valve  stenosis.  7. Aortic dilatation noted. There is borderline dilatation of the  ascending aorta, measuring 38 mm.  8. The inferior vena cava is normal in size with greater than 50%  respiratory variability, suggesting right atrial pressure of 3 mmHg.    08/18/2019: TTE IMPRESSIONS  1. Left ventricular ejection fraction, by visual estimation, is 25 to  30%. The left ventricle has severely decreased function. Mildly increased  left ventricular size. There is mildly increased left ventricular  hypertrophy.  2. Left ventricular diastolic Doppler parameters are consistent with  impaired relaxation pattern of LV diastolic filling.  3. Global right ventricle has normal systolic function.The right  ventricular size is mildly enlarged.  4. Left atrial size was mildly dilated.  5. Right atrial size was normal.  6. The mitral valve is normal in structure. Mild mitral valve  regurgitation. No evidence of mitral stenosis.  7. The tricuspid valve is normal in structure. Tricuspid valve  regurgitation is mild.  8. The aortic valve is tricuspid Aortic valve regurgitation is mild by  color flow Doppler.  Mild  aortic valve sclerosis without stenosis.  9. The pulmonic valve was grossly normal. Pulmonic valve regurgitation is  trivial by color flow Doppler.  10. Aortic dilatation noted.  11. There is mild dilatation of the aortic root measuring 39 mm.  12. Mildly elevated pulmonary artery systolic pressure.  13. A pacer wire is visualized.  14. The inferior vena cava is normal in size with greater than 50%  respiratory variability, suggesting right atrial pressure of 3 mmHg.  15. Severe LV systolic dysfunction; grade 1 diastolic dysfunction; mild  LVH and LVE; mild AI, MR and TR; mild LAE and RVE; mildly elevated  pulmonary pressure.     06/10/2017: EPS/ablation Conclusion: Successful EP study and catheter ablation of the patient's clinical ventricular tachycardia utilizing 3-dimensional electro anatomic mapping. There is a paucity of scar tissue on the endocardium of the left ventricle. A second VT was induced which was hemodynamically unstable and could not be mapped. There is no clearcut scar tissue associated with the endocardial surface of the left ventricle from which this VT originated.    07/22/16: TTE Study Conclusions - Left ventricle: The cavity size was mildly dilated. There was   mild focal basal hypertrophy of the septum. Systolic function was   severely reduced. The estimated ejection fraction was in the   range of 25% to 30%. Diffuse hypokinesis. Hypokinesis is worse in   the inferolateral and anteriolateral myocardium. Doppler   parameters are consistent with abnormal left ventricular   relaxation (grade 1 diastolic dysfunction). Doppler parameters   are consistent with indeterminate ventricular filling pressure. - Aortic valve: Transvalvular velocity was within the normal range.   There was no stenosis. There was mild regurgitation. - Mitral valve: Transvalvular velocity was within the normal range.   There was no evidence for stenosis. There was mild regurgitation. - Left  atrium: The atrium was moderately dilated. 34mm - Right ventricle: The cavity size was mildly dilated. Wall   thickness was normal. Systolic function was mildly reduced. - Tricuspid valve: There was mild regurgitation. - Pulmonic valve: Transvalvular velocity was within the normal   range. There was no evidence for stenosis. There was mild   regurgitation. - Pulmonary arteries: Systolic pressure was mildly increased. PA   peak pressure: 41 mm Hg (S). - Pericardium, extracardiac: A small pericardial effusion was   identified.  Recent Labs: 12/09/2020: TSH 0.833 12/11/2020: ALT 54; B Natriuretic Peptide 129.8; BUN 26; Creatinine, Ser 1.20; Hemoglobin 11.7; Magnesium 2.2; Platelets 151; Potassium 4.0; Sodium 133  No results found for requested labs within last 8760 hours.   Estimated Creatinine Clearance: 55.5 mL/min (by C-G formula based on SCr of 1.2 mg/dL).   Wt Readings from Last 3 Encounters:  12/11/20 205 lb 12.8 oz (93.4 kg)  10/25/20 211 lb 12.8 oz (96.1 kg)  08/12/20 211 lb (95.7 kg)     Other studies reviewed: Additional studies/records reviewed today include: summarized above  ASSESSMENT AND PLAN:  1. VT     on Amiodarone and ranexa     None further, perhaps 2/2 COVID infection, his inflammatory markes were elevated     Reports his breathing at his baseline   2. ICD     Battery reached RRT 11/11/20     Intact function, no programming changes made.     Patient and wife want to make certain that he gets another ICD< in prior d/w Dr. [name] have been clear, do not want downgrade to pacer      Reviewed  generator change procedure, potential risks and benefts, he remains agreeable to proceed He states Dr. [name] previously asked him to hold Eliquis one day prior to procedure, no eliquis day prior or AM of procedure, post procedure recommendations per Dr. [name]  No torsemide Am of procedure   3. AFib, Persistent     burden 0 %     CHA2DS2Vasc is 5, on Eliquis,  appropriately dosed  4. Ischemic/nonischemic CM 5. Chronic CHF (systolic)     BP has limited GDMT     On BB, torsemide     Cor [place]ue looks OK, he reports stable weights at home     No exam findings to suggest volume OL currently      <1% VP  6. HTN     With relative lower BPs on meds     No symptoms  Disposition: plan ICD generator change,  Routine post procedure follow up     Current medicines are reviewed at length with the patient today.  The patient did not have any concerns regarding medicines.  Haywood Lasso, PA-C 12/16/2020 11:14 AM     [facility] [address] [place] [address] [phone] (office)  [phone] (fax)

## 2020-12-19 ENCOUNTER — Encounter: Payer: Self-pay | Admitting: *Deleted

## 2020-12-19 ENCOUNTER — Encounter: Payer: Self-pay | Admitting: Physician Assistant

## 2020-12-19 ENCOUNTER — Other Ambulatory Visit: Payer: Self-pay

## 2020-12-19 ENCOUNTER — Ambulatory Visit (INDEPENDENT_AMBULATORY_CARE_PROVIDER_SITE_OTHER): Payer: PPO | Admitting: Physician Assistant

## 2020-12-19 VITALS — BP 100/60 | HR 65 | Ht 73.0 in | Wt 200.0 lb

## 2020-12-19 DIAGNOSIS — I472 Ventricular tachycardia, unspecified: Secondary | ICD-10-CM

## 2020-12-19 DIAGNOSIS — Z4502 Encounter for adjustment and management of automatic implantable cardiac defibrillator: Secondary | ICD-10-CM | POA: Diagnosis not present

## 2020-12-19 DIAGNOSIS — I4819 Other persistent atrial fibrillation: Secondary | ICD-10-CM | POA: Diagnosis not present

## 2020-12-19 DIAGNOSIS — I255 Ischemic cardiomyopathy: Secondary | ICD-10-CM

## 2020-12-19 DIAGNOSIS — Z95810 Presence of automatic (implantable) cardiac defibrillator: Secondary | ICD-10-CM

## 2020-12-19 DIAGNOSIS — Z7901 Long term (current) use of anticoagulants: Secondary | ICD-10-CM

## 2020-12-19 DIAGNOSIS — I5040 Unspecified combined systolic (congestive) and diastolic (congestive) heart failure: Secondary | ICD-10-CM | POA: Diagnosis not present

## 2020-12-19 LAB — CUP PACEART INCLINIC DEVICE CHECK
Battery Remaining Longevity: 0 mo
Brady Statistic RA Percent Paced: 56 %
Brady Statistic RV Percent Paced: 0.5 %
Date Time Interrogation Session: 20220127175916
HighPow Impedance: 86.625
Implantable Lead Implant Date: 20130213
Implantable Lead Implant Date: 20130213
Implantable Lead Location: 753859
Implantable Lead Location: 753860
Implantable Pulse Generator Implant Date: 20130213
Lead Channel Impedance Value: 437.5 Ohm
Lead Channel Impedance Value: 487.5 Ohm
Lead Channel Pacing Threshold Amplitude: 0.5 V
Lead Channel Pacing Threshold Amplitude: 0.75 V
Lead Channel Pacing Threshold Amplitude: 0.75 V
Lead Channel Pacing Threshold Pulse Width: 0.5 ms
Lead Channel Pacing Threshold Pulse Width: 0.6 ms
Lead Channel Pacing Threshold Pulse Width: 0.6 ms
Lead Channel Sensing Intrinsic Amplitude: 12 mV
Lead Channel Sensing Intrinsic Amplitude: 2.3 mV
Lead Channel Setting Pacing Amplitude: 1.5 V
Lead Channel Setting Pacing Amplitude: 2.5 V
Lead Channel Setting Pacing Pulse Width: 0.6 ms
Lead Channel Setting Sensing Sensitivity: 0.5 mV
Pulse Gen Serial Number: 7003419

## 2020-12-19 MED ORDER — TORSEMIDE 20 MG PO TABS: 20.0000 mg | ORAL_TABLET | Freq: Every day | ORAL | 1 refills | Status: DC

## 2020-12-19 MED ORDER — METOPROLOL SUCCINATE ER 25 MG PO TB24: ORAL_TABLET | ORAL | 1 refills | Status: DC

## 2020-12-19 NOTE — Patient Instructions (Addendum)
Medication Instructions:   Your physician recommends that you continue on your current medications as directed. Please refer to the Current Medication list given to you today.   *If you need a refill on your cardiac medications before your next appointment, please call your pharmacy*   Lab Work: [place]    If you have labs (blood work) drawn today and your tests are completely normal, you will receive your results only by: Marland Kitchen MyChart Message (if you have MyChart) OR . A paper copy in the mail If you have any lab test that is abnormal or we need to change your treatment, we will call you to review the results.   Testing/Procedures:  SEE LETTER FOR GENERATOR CHANGE ON HEART DEVICE   Follow-Up: At [facility], you and your health needs are our priority.  As part of our continuing mission to provide you with exceptional heart care, we have created designated Provider Care Teams.  These Care Teams include your primary Cardiologist (physician) and Advanced Practice Providers (APPs -  Physician Assistants and Nurse Practitioners) who all work together to provide you with the care you need, when you need it.  We recommend signing up for the patient portal called "MyChart".  Sign up information is provided on this After Visit Summary.  MyChart is used to connect with patients for Virtual Visits (Telemedicine).  Patients are able to view lab/test results, encounter notes, upcoming appointments, etc.  Non-urgent messages can be sent to your provider as well.   To learn more about what you can do with MyChart, go to [email or web address].    Your next appointment:  AFTER 01-20-21   14 DAY WOUND CHECK WITH DEVICE   3 month(s) PHYS DEFIB   The format for your next appointment:   In Person  Provider:   You may see Virl Axe, MD or one of the following Advanced Practice Providers on your designated Care Team:    Chanetta Marshall, NP  Tommye Standard, PA-C  Legrand Como "Jonni Sanger" Chalmers Cater,  [place]    Other Instructions                      Implantable Device Instructions  You are scheduled for:  Generator Change on  01-20-21  with Dr. [name].  1.   Please arrive at the [facility] at  10:00 am on the day of your       procedure.  2. Do not eat or drink the night before your procedure.  3.   Completed lab work on 12-11-20  4.   Do NOT take these medications:        1.Torsemide morning of your procedure:       2. Take your last dose of Eliquis 01-18-21 and hold 01-20-21 morning of procedure.  5.  Plan for an overnight stay.  Bring your insurance cards and a list of you medications.  6.  Wash your chest and neck with antiseptic scrub provided for you  the evening before and the morning of your procedure.  Rinse well.   * If you have ANY questions after you get home, please call the office [phone].  * Every attempt is made to prevent procedures from being rescheduled.  Due to the nature of    Electrophysiology, rescheduling can happen.  The physician is always aware and directs the staff when this   occurs.

## 2020-12-20 ENCOUNTER — Ambulatory Visit (INDEPENDENT_AMBULATORY_CARE_PROVIDER_SITE_OTHER): Payer: PPO

## 2020-12-20 DIAGNOSIS — I472 Ventricular tachycardia, unspecified: Secondary | ICD-10-CM

## 2020-12-20 LAB — CUP PACEART REMOTE DEVICE CHECK
Battery Remaining Longevity: 0 mo
Battery Voltage: 2.59 V
Brady Statistic AP VP Percent: 1 %
Brady Statistic AP VS Percent: 57 %
Brady Statistic AS VP Percent: 1 %
Brady Statistic AS VS Percent: 42 %
Brady Statistic RA Percent Paced: 57 %
Brady Statistic RV Percent Paced: 1 %
Date Time Interrogation Session: 20220128020015
HighPow Impedance: 87 Ohm
HighPow Impedance: 87 Ohm
Implantable Lead Implant Date: 20130213
Implantable Lead Implant Date: 20130213
Implantable Lead Location: 753859
Implantable Lead Location: 753860
Implantable Pulse Generator Implant Date: 20130213
Lead Channel Impedance Value: 440 Ohm
Lead Channel Impedance Value: 490 Ohm
Lead Channel Pacing Threshold Amplitude: 0.5 V
Lead Channel Pacing Threshold Amplitude: 0.75 V
Lead Channel Pacing Threshold Pulse Width: 0.5 ms
Lead Channel Pacing Threshold Pulse Width: 0.6 ms
Lead Channel Sensing Intrinsic Amplitude: 1.9 mV
Lead Channel Sensing Intrinsic Amplitude: 12 mV
Lead Channel Setting Pacing Amplitude: 1.5 V
Lead Channel Setting Pacing Amplitude: 2.5 V
Lead Channel Setting Pacing Pulse Width: 0.6 ms
Lead Channel Setting Sensing Sensitivity: 0.5 mV
Pulse Gen Serial Number: 7003419

## 2020-12-28 NOTE — Progress Notes (Signed)
Remote pacemaker transmission.   

## 2021-01-02 ENCOUNTER — Ambulatory Visit (INDEPENDENT_AMBULATORY_CARE_PROVIDER_SITE_OTHER): Payer: PPO | Admitting: Physician Assistant

## 2021-01-02 ENCOUNTER — Ambulatory Visit (INDEPENDENT_AMBULATORY_CARE_PROVIDER_SITE_OTHER): Payer: PPO

## 2021-01-02 ENCOUNTER — Encounter: Payer: Self-pay | Admitting: Physician Assistant

## 2021-01-02 DIAGNOSIS — M25571 Pain in right ankle and joints of right foot: Secondary | ICD-10-CM

## 2021-01-02 NOTE — Progress Notes (Deleted)
Cardiology Office Note Date:  01/02/2021  Patient ID:  Corey Tyler, Corey Tyler 24-Jul-1941, MRN [id number] PCP:  [name], MD  Cardiologist:  Dr. Percival Spanish Electrophysiologist: Dr. [name] Pulmonologist: Dr. Gwenette Greet ([place])    Chief Complaint:  Planned EP follow up  History of Present Illness: Corey Tyler is a 80 y.o. male with history of Paroxysmal AFib, NICM w/ICD, chronic CHF (systolic), VT (polymorphic in 2014), COPD is O2 dependent  He was admitted to [facility] 11/02/16 with c/o weakness noted in monomorphic VT below his detection rate, his device reprogrammed and his home amiodarone re-bolused, discharged 11/04/16 on 400mg  BID with plans to schedule EST to make sure his sinus rate does not get above his detection rate.    He comes in today to be seen for Dr. [name] who last saw him in March 2021.  Discussed recent need for discontinueation and reduction of a number of his medicines 2/2 orthostatic dizziness He had gotten ATP for VT (Feb 2021) Amiodarone continued despite pulm hx  More recently he saw Dr. Percival Spanish 05/14/20 Know hypotension, mentions moves slowly 2/2 this, no reports of syncope.  He was taking lopresor once daily and was changed to metoprolol succ. Planned to update his BMET   I saw him Sept 2021 He is with his wife today, he comes with a walker on O2. He denies any CP, palpitations, no dizzy spells, or syncope, no shocks He has baseline SOB/DOE, this is unchanged y his account, no unusual SOB or DOE, no symptoms of orthopnea or PND He has known hx of orthostatic dizziness that is bothersome to him, particularly when he needs to urinate with some urgency. He has not fainted or fallen. Both he and his wife are very knowledgeable on the difficult balance of volume management to avoid volume OL and dehydration. He thinks he is still taking metoprolol tartrate, his wife seems surprised, they aren't sure, but is taking a metoprolol 1/2 tab daily. He reports his weight at hom by  his scale 200, and his weights range from 200-205lbs He does not feel like he is retaining right now. He denies any bleeding or signs of bleeding, has bruises on his arms, none otherwise He sees Dr. Percival Spanish next week as well as his PMD who will be doing his annual labs. No arrhythmias, no changes were made.  He reached ERI 11/11/20 and planned for gen change though his pre-procedure COVID test came + and deferred.  He was admitted to [facility] 12/09/20 for VT/syncope, device therapy Device transmission (12/09/20) presenting rhythm is AS/VS Battery ERI reached 11/11/20 Lead measurements are good One VT episode 12/08/20 @ 11;12AM PMVT  > ATP > MMVT >ATP  > shock successful No other arrhythmias Labs were OK Suspect perhaps triggered by his COVID illness noting his inflammatory markers were quite elevated, and did report mild symptoms of illness and low grade ever at home,  his amiodarone increased to 200mg  BID at least for the short term. Discharged 1/19/2 with a short course of steroids and Remdesivir  Subsequent phone note reports unable to tolerate BID amiodarone 2/2 fatigue and self reduced back to daily and his metoprolol to 1/2 tab daily  I saw him 12/19/20 He is back to baseline, states that he may have felt a little more congested then usual, like he had a mild flu illness.   Back to his baseline respiratory status, uses Home O2 at baseline No CP, palpitations prior to or since his VT episodes' No near syncope  or syncope since home, had one very brief dizzy spell. No bleeding or signs of bleeding He broke his foot when he fainted, his wife says foot seemd to get stuck/twisted under the cabinets, planned for the orthopedic boot 82mo. The BID amiodarone really made him feel fatigued. Better back on his baseline daily dose He tells me that since his last visit with Dr. Percival Spanish he has been taking the metoprolol 1/2 tab bid and the torsemide 20mg  daily with an extra 1/2 tab on Wed,  Sat/Sun. No changes were made, planned for gen change  *** symptoms *** volume *** meds    Device information: SJM dual chamber ICD, implanted 01/06/12, Dr. [name], primary prevention\ The patient is aware of device alert, industry representative d/w the patient again today he was aware and has been already demonstrated the vibratory alert  VT hx + hx ploymorphic VT with therapy 2014 Monomorphic VT below detection rate Dec 2017 VT ablation 06/10/2017 (Dr. Lovena Le) Nov 2018 VT ATP Feb 2021 ATP/shock appropriate Jan 2022 (in environment of COVID illness)   AAD hx 2017 Amiodarone is current 2018 Ranexa > uptitrated is current Record reports intolerant of Mexiletine (feeling of off balance)  AFib Hx GI bleed on Xarelto    Past Medical History:  Diagnosis Date  . A-fib ([place])    a. Noted on 12/2012 interrogation, placed on Apixaban and ultimately stopped NOACs 2/2 personal decision 8/14.  Marland Kitchen AICD (automatic cardioverter/defibrillator) present    Cardiac defibrillator -dual  [employer]  . Arthritis   . Atrial tachycardia-non sustained    a. Noted on 03/2012 interrogation.  . Chronic systolic heart failure (HCC)    EF down to 20 to 25% per echo November 2012  . COPD (chronic obstructive pulmonary disease) ([place])   . Hyperlipidemia   . Hypertension   . ICD (implantable cardiac defibrillator) in place   . NICM (nonischemic cardiomyopathy) ([place])    a. Normal cors 2000. b. Minimal plaque 2013.  . Old myocardial infarction    a. ?Silent MI. b. Large fixed inferolateral defect c/w with prior infarct, no ischemia. c. Cath in 2000/2013 with only minimal CAD.  Marland Kitchen Presence of permanent cardiac pacemaker   . Pulmonary nodule, right    Last scan in 2010 showing stability; felt to be benign.  Marland Kitchen PVC's (premature ventricular contractions)   . Ventricular tachycardia, polymorphic ([place])    Rx via ICD 12/14    Past Surgical History:  Procedure Laterality Date  . APPENDECTOMY    . CARDIAC  CATHETERIZATION  2000  . CARDIAC DEFIBRILLATOR PLACEMENT    . CARDIOVERSION  03/30/2013   Procedure: CARDIOVERSION;  Surgeon: Thayer Headings, MD;  Location: [place];  Service: Cardiovascular;;  . COLON SURGERY    . COLONOSCOPY N/A 09/25/2013   Procedure: COLONOSCOPY;  Surgeon: Jerene Bears, MD;  Location: [facility] ENDOSCOPY;  Service: Endoscopy;  Laterality: N/A;  . ICD  12/2011   [name]  . IMPLANTABLE CARDIOVERTER DEFIBRILLATOR IMPLANT N/A 01/06/2012   Procedure: IMPLANTABLE CARDIOVERTER DEFIBRILLATOR IMPLANT;  Surgeon: Deboraha Sprang, MD;  Location: [facility] CATH LAB;  Service: Cardiovascular;  Laterality: N/A;  . PACEMAKER IMPLANT     [name]  . TEE WITHOUT CARDIOVERSION N/A 03/30/2013   Procedure: TRANSESOPHAGEAL ECHOCARDIOGRAM (TEE);  Surgeon: Thayer Headings, MD;  Location: [place];  Service: Cardiovascular;  Laterality: N/A;  . V TACH ABLATION  06/10/2017  . V TACH ABLATION N/A 06/10/2017   Procedure: V Tach Ablation;  Surgeon: Evans Lance,  MD;  Location: [place] CV LAB;  Service: Cardiovascular;  Laterality: N/A;    Current Outpatient Medications  Medication Sig Dispense Refill  . acetaminophen (TYLENOL) 500 MG tablet Take 500 mg by mouth every 6 (six) hours as needed for headache (pain).    Marland Kitchen albuterol (PROVENTIL) (2.5 MG/3ML) 0.083% nebulizer solution Take 2.5 mg by nebulization every 6 (six) hours as needed for wheezing or shortness of breath.    Marland Kitchen albuterol (VENTOLIN HFA) 108 (90 Base) MCG/ACT inhaler Inhale 2 puffs into the lungs every 6 (six) hours as needed for wheezing or shortness of breath.    Marland Kitchen amiodarone (PACERONE) 200 MG tablet Take 1 tablet (200 mg total) by mouth 2 (two) times daily. 60 tablet 0  . budesonide-formoterol (SYMBICORT) 160-4.5 MCG/ACT inhaler Inhale 2 puffs into the lungs 2 (two) times daily.    . Carboxymethylcellul-Glycerin (LUBRICATING EYE DROPS OP) Place 1 drop into both eyes daily.    . cholecalciferol (VITAMIN D) 1000 UNITS tablet Take 1,000 Units  by mouth daily.    Marland Kitchen ELIQUIS 5 MG TABS tablet TAKE 1 TABLET BY MOUTH TWICE A DAY 60 tablet 5  . finasteride (PROSCAR) 5 MG tablet Take 5 mg by mouth daily.     Marland Kitchen levothyroxine (SYNTHROID, LEVOTHROID) 125 MCG tablet Take 125 mcg by mouth daily before breakfast.     . MAGNESIUM PO Take 420 mg by mouth at bedtime.    . Melatonin 3 MG TABS Take 3 mg by mouth at bedtime.    . methylPREDNISolone (MEDROL DOSEPAK) 4 MG TBPK tablet follow package directions 21 tablet 0  . metoprolol succinate (TOPROL XL) 25 MG 24 hr tablet TAKE 1/2 TABLET BID 90 tablet 1  . Multiple Vitamins-Minerals (MULTIVITAMIN WITH MINERALS) tablet Take 1 tablet by mouth daily. Mens 50 plus    . nitroGLYCERIN (NITROSTAT) 0.4 MG SL tablet Place 0.4 mg under the tongue every 5 (five) minutes as needed for chest pain.     . Omega-3 Fatty Acids (FISH OIL) 1000 MG CPDR Take 1,000 mg by mouth daily.    . OXYGEN Inhale 3-4 L into the lungs See admin instructions. 3 L at rest and 4 L with exertion    . potassium chloride (MICRO-K) 10 MEQ CR capsule Take 10 mEq by mouth at bedtime.    . promethazine (PHENERGAN) 25 MG tablet Take 25 mg by mouth daily as needed for nausea or vomiting.     . Psyllium (METAMUCIL PO) Take 15 mLs by mouth daily. Mix in liquid and drink    . ranolazine (RANEXA) 1000 MG SR tablet Take 1 tablet (1,000 mg total) by mouth 2 (two) times daily. 180 tablet 3  . sodium chloride (OCEAN) 0.65 % SOLN nasal spray Place 1 spray into both nostrils daily as needed for congestion.    . tamsulosin (FLOMAX) 0.4 MG CAPS capsule Take 0.4 mg by mouth at bedtime.    . Tiotropium Bromide Monohydrate (SPIRIVA RESPIMAT) 2.5 MCG/ACT AERS Inhale 2 puffs into the lungs daily.    Marland Kitchen torsemide (DEMADEX) 20 MG tablet Take 1 tablet (20 mg total) by mouth daily. Take 1 tablet by mouth daily and 1 1/2 tablet on  Wed Sat Sun and 135 tablet 1   No current facility-administered medications for this visit.    Allergies:   Atorvastatin, Statins,  Xarelto [rivaroxaban], Adhesive [tape], Codeine, Isosorbide nitrate, Latex, Levofloxacin, Lisinopril, Lorazepam, Mexiletine, and Sertraline   Social History:  The patient  reports that he quit smoking about  37 years ago. His smoking use included cigarettes. He has a 100.00 pack-year smoking history. He has never used smokeless tobacco. He reports that he does not drink alcohol and does not use drugs.   Family History:  The patient's family history includes Emphysema in his mother; Esophageal cancer in his brother; Heart disease in his mother; Heart failure in his father.  ROS:  Please see the history of present illness.  All other systems are reviewed and otherwise negative.   PHYSICAL EXAM:  VS:  There were no vitals taken for this visit. BMI: There is no height or weight on file to calculate BMI. Well nourished, well developed, in no acute distress  HEENT: normocephalic, atraumatic  Neck: no JVD, carotid bruits or masses Cardiac: *** RRR; no significant murmurs, no rubs, or gallops Lungs:   *** Somewhat diminished throughout, no wheezing, rhonchi or rales  Abd: soft, nontender MS: no deformity  Ext: *** no edema  Skin: warm and dry, no rash Neuro:  No gross deficits appreciated Psych: euthymic mood, full affect  *** ICD site is stable, no tethering or discomfort   EKG:  Done today and reviewed by myself ***  ICD interrogation done today and reviewed by myself:  *** Battery reached ERI 11/11/20 Lead measurements are good *** No new arrhythmias *** AP 56% *** VP <1%  12/10/20: TTE IMPRESSIONS  1. Akinesis of the basal inferolateral wall with overall moderate to  severe LV dysfunction.  2. Left ventricular ejection fraction, by estimation, is 30 to 35%. The  left ventricle has moderate to severely decreased function. The left  ventricle demonstrates regional wall motion abnormalities (see scoring  diagram/findings for description). The  left ventricular internal cavity size  was mildly dilated. There is mild  left ventricular hypertrophy. Left ventricular diastolic parameters are  consistent with Grade I diastolic dysfunction (impaired relaxation).  3. Right ventricular systolic function is normal. The right ventricular  size is normal.  4. Left atrial size was mildly dilated.  5. The mitral valve is normal in structure. Mild mitral valve  regurgitation. No evidence of mitral stenosis.  6. The aortic valve is tricuspid. Aortic valve regurgitation is mild.  Mild aortic valve sclerosis is present, with no evidence of aortic valve  stenosis.  7. Aortic dilatation noted. There is borderline dilatation of the  ascending aorta, measuring 38 mm.  8. The inferior vena cava is normal in size with greater than 50%  respiratory variability, suggesting right atrial pressure of 3 mmHg.    08/18/2019: TTE IMPRESSIONS  1. Left ventricular ejection fraction, by visual estimation, is 25 to  30%. The left ventricle has severely decreased function. Mildly increased  left ventricular size. There is mildly increased left ventricular  hypertrophy.  2. Left ventricular diastolic Doppler parameters are consistent with  impaired relaxation pattern of LV diastolic filling.  3. Global right ventricle has normal systolic function.The right  ventricular size is mildly enlarged.  4. Left atrial size was mildly dilated.  5. Right atrial size was normal.  6. The mitral valve is normal in structure. Mild mitral valve  regurgitation. No evidence of mitral stenosis.  7. The tricuspid valve is normal in structure. Tricuspid valve  regurgitation is mild.  8. The aortic valve is tricuspid Aortic valve regurgitation is mild by  color flow Doppler. Mild aortic valve sclerosis without stenosis.  9. The pulmonic valve was grossly normal. Pulmonic valve regurgitation is  trivial by color flow Doppler.  10. Aortic dilatation noted.  11. There is mild dilatation of the aortic  root measuring 39 mm.  12. Mildly elevated pulmonary artery systolic pressure.  13. A pacer wire is visualized.  14. The inferior vena cava is normal in size with greater than 50%  respiratory variability, suggesting right atrial pressure of 3 mmHg.  15. Severe LV systolic dysfunction; grade 1 diastolic dysfunction; mild  LVH and LVE; mild AI, MR and TR; mild LAE and RVE; mildly elevated  pulmonary pressure.     06/10/2017: EPS/ablation Conclusion: Successful EP study and catheter ablation of the patient's clinical ventricular tachycardia utilizing 3-dimensional electro anatomic mapping. There is a paucity of scar tissue on the endocardium of the left ventricle. A second VT was induced which was hemodynamically unstable and could not be mapped. There is no clearcut scar tissue associated with the endocardial surface of the left ventricle from which this VT originated.    07/22/16: TTE Study Conclusions - Left ventricle: The cavity size was mildly dilated. There was   mild focal basal hypertrophy of the septum. Systolic function was   severely reduced. The estimated ejection fraction was in the   range of 25% to 30%. Diffuse hypokinesis. Hypokinesis is worse in   the inferolateral and anteriolateral myocardium. Doppler   parameters are consistent with abnormal left ventricular   relaxation (grade 1 diastolic dysfunction). Doppler parameters   are consistent with indeterminate ventricular filling pressure. - Aortic valve: Transvalvular velocity was within the normal range.   There was no stenosis. There was mild regurgitation. - Mitral valve: Transvalvular velocity was within the normal range.   There was no evidence for stenosis. There was mild regurgitation. - Left atrium: The atrium was moderately dilated. 16mm - Right ventricle: The cavity size was mildly dilated. Wall   thickness was normal. Systolic function was mildly reduced. - Tricuspid valve: There was mild regurgitation. -  Pulmonic valve: Transvalvular velocity was within the normal   range. There was no evidence for stenosis. There was mild   regurgitation. - Pulmonary arteries: Systolic pressure was mildly increased. PA   peak pressure: 41 mm Hg (S). - Pericardium, extracardiac: A small pericardial effusion was   identified.  Recent Labs: 12/09/2020: TSH 0.833 12/11/2020: ALT 54; B Natriuretic Peptide 129.8; BUN 26; Creatinine, Ser 1.20; Hemoglobin 11.7; Magnesium 2.2; Platelets 151; Potassium 4.0; Sodium 133  No results found for requested labs within last 8760 hours.   CrCl cannot be calculated (Patient's most recent lab result is older than the maximum 21 days allowed.).   Wt Readings from Last 3 Encounters:  12/19/20 200 lb (90.7 kg)  12/11/20 205 lb 12.8 oz (93.4 kg)  10/25/20 211 lb 12.8 oz (96.1 kg)     Other studies reviewed: Additional studies/records reviewed today include: summarized above  ASSESSMENT AND PLAN:  1. VT     on Amiodarone and ranexa     *** None further, perhaps 2/2 COVID infection, his inflammatory markes were elevated     *** Reports his breathing at his baseline   2. ICD     Battery reached RRT 11/11/20     Intact function, no programming changes made.     Patient and wife want to make certain that he gets another ICD< in prior d/w Dr. [name] have been clear, do not want downgrade to pacer   ***    Reviewed generator change procedure, potential risks and benefts, he remains agreeable to proceed He states Dr. [name] previously asked him to hold Eliquis one day  prior to procedure, no eliquis day prior or AM of procedure, post procedure recommendations per Dr. [name]  No torsemide Am of procedure   3. AFib, Persistent     burden 0 %     CHA2DS2Vasc is 5, on Eliquis, *** appropriately dosed  4. Ischemic/nonischemic CM 5. Chronic CHF (systolic)     BP has limited GDMT     On BB, torsemide     *** [place] looks OK, he reports stable weights at home     *** No exam  findings to suggest volume OL currently      *** <1% VP  6. HTN     ***  With relative lower BPs on meds     *** No symptoms   Disposition: ***     Current medicines are reviewed at length with the patient today.  The patient did not have any concerns regarding medicines.  Haywood Lasso, PA-C 01/02/2021 7:34 AM     [place] [address] [phone] (office)  [phone] (fax)

## 2021-01-02 NOTE — Progress Notes (Signed)
Office Visit Note   Patient: Corey Tyler           Date of Birth: 1941-01-13           MRN: [id number] Visit Date: 01/02/2021              Requested by: [name], [address] [place] [address] [place],  [place] [address] PCP: [name], MD  Chief Complaint  Patient presents with  . Right Foot - Pain      HPI: This is a pleasant 80 year old gentleman who presents with his wife.  He is approximately 4 weeks status post falling after his automatic defibrillator kicked in.  He was seen and evaluated and told he had a sesamoid fracture.  He is in a boot stents.  He really does not have a lot of pain  Assessment & Plan: Visit Diagnoses:  1. Pain in right ankle and joints of right foot     Plan: Patient may transition to a supportive shoe.  We will follow-up for final visit in about 4 weeks.  He does not need x-rays unless he is having pain  Follow-Up Instructions: No follow-ups on file.   Ortho Exam  Patient is alert, oriented, no adenopathy, well-dressed, normal affect, normal respiratory effort. Examination of his foot demonstrates no swelling he does have mild erythema of the second toe which is a hammertoe.  Wife says this is secondary to the boot rubbing on the top.  He is minimally tender to deep palpation over the tibial sesamoid.  Overall well-maintained alignment skin is intact no evidence of infection  Imaging: XR Foot Complete Right  Result Date: 01/02/2021 X-rays of his right foot demonstrate well-maintained alignment possibly a fracture across the sesamoid but is in good position.  No images are attached to the encounter.  Labs: Lab Results  Component Value Date   HGBA1C 6.0 (H) 03/27/2013   CRP 8.3 (H) 12/11/2020   CRP 14.7 (H) 12/10/2020   CRP 15.2 (H) 12/09/2020   REPTSTATUS 07/30/2019 FINAL 07/29/2019   CULT (A) 07/29/2019    <10,000 COLONIES/mL INSIGNIFICANT GROWTH Performed at [facility] [address], [place], [place] [address]     LABORGA Normal Upper Respiratory Flora 12/10/2014   LABORGA No Beta Hemolytic Streptococci Isolated 12/10/2014     Lab Results  Component Value Date   ALBUMIN 2.5 (L) 12/11/2020   ALBUMIN 2.7 (L) 12/10/2020   ALBUMIN 3.7 08/20/2020    Lab Results  Component Value Date   MG 2.2 12/11/2020   MG 2.2 12/10/2020   MG 2.0 12/09/2020   Lab Results  Component Value Date   VD25OH 26.1 (L) 07/29/2019   VD25OH 31 02/27/2013    No results found for: PREALBUMIN CBC EXTENDED Latest Ref Rng & Units 12/11/2020 12/10/2020 12/09/2020  WBC 4.0 - 10.5 K/uL 6.6 3.5(L) 5.8  RBC 4.22 - 5.81 MIL/uL 3.63(L) 3.81(L) 3.66(L)  HGB 13.0 - 17.0 g/dL 11.7(L) 12.4(L) 11.9(L)  HCT 39.0 - 52.0 % 35.6(L) 37.5(L) 36.3(L)  PLT 150 - 400 K/uL 151 159 164  NEUTROABS 1.7 - 7.7 K/uL 5.3 2.6 3.9  LYMPHSABS 0.7 - 4.0 K/uL 0.6(L) 0.7 1.1     There is no height or weight on file to calculate BMI.  Orders:  Orders Placed This Encounter  Procedures  . XR Foot Complete Right   No orders of the defined types were placed in this encounter.    Procedures: No procedures performed  Clinical Data: No  additional findings.  ROS:  All other systems negative, except as noted in the HPI. Review of Systems  Objective: Vital Signs: There were no vitals taken for this visit.  Specialty Comments:  No specialty comments available.  PMFS History: Patient Active Problem List   Diagnosis Date Noted  . Syncope 12/09/2020  . Educated about COVID-19 virus infection 05/13/2020  . Orthostatic hypotension 12/29/2019  . NSVT (nonsustained ventricular tachycardia) ([place]) 12/29/2019  . Tremor 10/18/2019  . Hypomagnesemia 09/07/2019  . Hypokalemia 09/07/2019  . CKD (chronic kidney disease) stage 3, GFR 30-59 ml/min ([place]) 09/02/2019  . SBO (small bowel obstruction) ([place]) 08/31/2019  . Chronic respiratory failure with hypoxia ([place]) 08/31/2019  . Chronic anticoagulation 08/31/2019  . Hypothyroidism 08/31/2019  . Chronic  nausea 08/31/2019  . Generalized weakness 07/29/2019  . Acute respiratory failure ([place]) 09/15/2017  . COPD exacerbation ([place]) 09/15/2017  . Sustained ventricular tachycardia ([place]) 06/10/2017  . VT (ventricular tachycardia) ([place]) 11/02/2016  . Benign fibroma of prostate 04/02/2015  . Ventricular tachycardia, polymorphic ([place])   . Diverticulosis of colon without hemorrhage 09/25/2013  . Atrial fibrillation ([place]) 12/29/2012  . ICD (implantable cardioverter-defibrillator) in place   . Hyperlipidemia   . PVCs 11/26/2011  . Chronic systolic heart failure ([place]) 10/07/2011  . Cardiomyopathy, nonischemic and ischemic 09/24/2011  . 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 PULMONARY NODULE 12/04/2008   Past Medical History:  Diagnosis Date  . A-fib ([place])    a. Noted on 12/2012 interrogation, placed on Apixaban and ultimately stopped NOACs 2/2 personal decision 8/14.  Marland Kitchen AICD (automatic cardioverter/defibrillator) present    Cardiac defibrillator -dual  [employer]  . Arthritis   . Atrial tachycardia-non sustained    a. Noted on 03/2012 interrogation.  . Chronic systolic heart failure ([facility])    EF down to 20 to 25% per echo November 2012  . COPD (chronic obstructive pulmonary disease) ([place])   . Hyperlipidemia   . Hypertension   . ICD (implantable cardiac defibrillator) in place   . NICM (nonischemic cardiomyopathy) ([place])    a. Normal cors 2000. b. Minimal plaque 2013.  . Old myocardial infarction    a. ?Silent MI. b. Large fixed inferolateral defect c/w with prior infarct, no ischemia. c. Cath in 2000/2013 with only minimal CAD.  Marland Kitchen Presence of permanent cardiac pacemaker   . Pulmonary nodule, right    Last scan in 2010 showing stability; felt to be benign.  Marland Kitchen PVC's (premature ventricular contractions)   . Ventricular tachycardia, polymorphic ([place])    Rx via ICD 12/14    Family History  Problem Relation Age of Onset  . Emphysema Mother   . Heart  disease Mother        AVR  . Heart failure Father        Died agwe 4  . Esophageal cancer Brother   . Colon cancer Neg Hx     Past Surgical History:  Procedure Laterality Date  . APPENDECTOMY    . CARDIAC CATHETERIZATION  2000  . CARDIAC DEFIBRILLATOR PLACEMENT    . CARDIOVERSION  03/30/2013   Procedure: CARDIOVERSION;  Surgeon: Thayer Headings, MD;  Location: [facility];  Service: Cardiovascular;;  . COLON SURGERY    . COLONOSCOPY N/A 09/25/2013   Procedure: COLONOSCOPY;  Surgeon: Jerene Bears, MD;  Location: [facility];  Service: Endoscopy;  Laterality: N/A;  . ICD  12/2011   [name]  . IMPLANTABLE CARDIOVERTER DEFIBRILLATOR IMPLANT N/A 01/06/2012   Procedure:  IMPLANTABLE CARDIOVERTER DEFIBRILLATOR IMPLANT;  Surgeon: Deboraha Sprang, MD;  Location: [facility] CATH LAB;  Service: Cardiovascular;  Laterality: N/A;  . PACEMAKER IMPLANT     St [name]  . TEE WITHOUT CARDIOVERSION N/A 03/30/2013   Procedure: TRANSESOPHAGEAL ECHOCARDIOGRAM (TEE);  Surgeon: Thayer Headings, MD;  Location: [facility];  Service: Cardiovascular;  Laterality: N/A;  . V TACH ABLATION  06/10/2017  . V TACH ABLATION N/A 06/10/2017   Procedure: V Tach Ablation;  Surgeon: Evans Lance, MD;  Location: [facility] CV LAB;  Service: Cardiovascular;  Laterality: N/A;   Social History   Occupational History  . Occupation: Retired    Comment: Press photographer  . Occupation: Works    Fish farm manager: [employer]  Tobacco Use  . Smoking status: Former Smoker    Packs/day: 2.50    Years: 40.00    Pack years: 100.00    Types: Cigarettes    Quit date: 11/24/1983    Years since quitting: 37.1  . Smokeless tobacco: Never Used  . Tobacco comment: 2 1/2 ppd x 40 years  Vaping Use  . Vaping Use: Never used  Substance and Sexual Activity  . Alcohol use: No  . Drug use: No  . Sexual activity: Yes    Birth control/protection: None

## 2021-01-03 ENCOUNTER — Encounter: Payer: PPO | Admitting: Physician Assistant

## 2021-01-09 ENCOUNTER — Encounter (HOSPITAL_COMMUNITY): Payer: Self-pay | Admitting: Emergency Medicine

## 2021-01-09 ENCOUNTER — Emergency Department (HOSPITAL_COMMUNITY)
Admission: EM | Admit: 2021-01-09 | Discharge: 2021-01-09 | Disposition: A | Discharge status: Home / Self Care | Payer: No Typology Code available for payment source | Attending: Emergency Medicine | Admitting: Emergency Medicine

## 2021-01-09 ENCOUNTER — Telehealth: Payer: Self-pay | Admitting: Internal Medicine

## 2021-01-09 ENCOUNTER — Emergency Department (HOSPITAL_COMMUNITY): Payer: No Typology Code available for payment source

## 2021-01-09 DIAGNOSIS — Z87891 Personal history of nicotine dependence: Secondary | ICD-10-CM | POA: Diagnosis not present

## 2021-01-09 DIAGNOSIS — Z91040 Latex allergy status: Secondary | ICD-10-CM | POA: Diagnosis not present

## 2021-01-09 DIAGNOSIS — I5022 Chronic systolic (congestive) heart failure: Secondary | ICD-10-CM | POA: Diagnosis not present

## 2021-01-09 DIAGNOSIS — E039 Hypothyroidism, unspecified: Secondary | ICD-10-CM | POA: Diagnosis not present

## 2021-01-09 DIAGNOSIS — J441 Chronic obstructive pulmonary disease with (acute) exacerbation: Secondary | ICD-10-CM | POA: Insufficient documentation

## 2021-01-09 DIAGNOSIS — R55 Syncope and collapse: Secondary | ICD-10-CM | POA: Insufficient documentation

## 2021-01-09 DIAGNOSIS — R079 Chest pain, unspecified: Secondary | ICD-10-CM | POA: Diagnosis not present

## 2021-01-09 DIAGNOSIS — N183 Chronic kidney disease, stage 3 unspecified: Secondary | ICD-10-CM | POA: Insufficient documentation

## 2021-01-09 DIAGNOSIS — I130 Hypertensive heart and chronic kidney disease with heart failure and stage 1 through stage 4 chronic kidney disease, or unspecified chronic kidney disease: Secondary | ICD-10-CM | POA: Insufficient documentation

## 2021-01-09 DIAGNOSIS — Z7901 Long term (current) use of anticoagulants: Secondary | ICD-10-CM | POA: Insufficient documentation

## 2021-01-09 DIAGNOSIS — Z7951 Long term (current) use of inhaled steroids: Secondary | ICD-10-CM | POA: Diagnosis not present

## 2021-01-09 DIAGNOSIS — I4891 Unspecified atrial fibrillation: Secondary | ICD-10-CM | POA: Insufficient documentation

## 2021-01-09 DIAGNOSIS — Z45010 Encounter for checking and testing of cardiac pacemaker pulse generator [battery]: Secondary | ICD-10-CM | POA: Insufficient documentation

## 2021-01-09 DIAGNOSIS — R000 Tachycardia, unspecified: Secondary | ICD-10-CM | POA: Diagnosis not present

## 2021-01-09 DIAGNOSIS — R001 Bradycardia, unspecified: Secondary | ICD-10-CM | POA: Diagnosis not present

## 2021-01-09 DIAGNOSIS — Z79899 Other long term (current) drug therapy: Secondary | ICD-10-CM | POA: Insufficient documentation

## 2021-01-09 DIAGNOSIS — R42 Dizziness and giddiness: Secondary | ICD-10-CM | POA: Diagnosis not present

## 2021-01-09 DIAGNOSIS — J439 Emphysema, unspecified: Secondary | ICD-10-CM | POA: Diagnosis not present

## 2021-01-09 DIAGNOSIS — I491 Atrial premature depolarization: Secondary | ICD-10-CM | POA: Diagnosis not present

## 2021-01-09 DIAGNOSIS — Z45018 Encounter for adjustment and management of other part of cardiac pacemaker: Secondary | ICD-10-CM

## 2021-01-09 LAB — BASIC METABOLIC PANEL
Anion gap: 9 (ref 5–15)
BUN: 19 mg/dL (ref 8–23)
CO2: 29 mmol/L (ref 22–32)
Calcium: 8.6 mg/dL — ABNORMAL LOW (ref 8.9–10.3)
Chloride: 98 mmol/L (ref 98–111)
Creatinine, Ser: 1.56 mg/dL — ABNORMAL HIGH (ref 0.61–1.24)
GFR, Estimated: 45 mL/min — ABNORMAL LOW (ref >60)
Glucose, Bld: 105 mg/dL — ABNORMAL HIGH (ref 70–99)
Potassium: 4.1 mmol/L (ref 3.5–5.1)
Sodium: 136 mmol/L (ref 135–145)

## 2021-01-09 LAB — CBC
HCT: 39.5 % (ref 39.0–52.0)
Hemoglobin: 12.7 g/dL — ABNORMAL LOW (ref 13.0–17.0)
MCH: 32.3 pg (ref 26.0–34.0)
MCHC: 32.2 g/dL (ref 30.0–36.0)
MCV: 100.5 fL — ABNORMAL HIGH (ref 80.0–100.0)
Platelets: 259 10*3/uL (ref 150–400)
RBC: 3.93 MIL/uL — ABNORMAL LOW (ref 4.22–5.81)
RDW: 15.9 % — ABNORMAL HIGH (ref 11.5–15.5)
WBC: 6 10*3/uL (ref 4.0–10.5)
nRBC: 0 % (ref 0.0–0.2)

## 2021-01-09 LAB — TROPONIN I (HIGH SENSITIVITY)
Troponin I (High Sensitivity): 14 ng/L (ref <18)
Troponin I (High Sensitivity): 13 ng/L (ref <18)

## 2021-01-09 NOTE — Discharge Instructions (Signed)
Your workup was overall reassuring in the ED today. Your pacemaker/defibrillator did not show any abnormalities during interrogation today.   Please follow up with both your PCP and your cardiologist Dr. [name] regarding your ED visit today  Return to the ED for any worsening symptoms

## 2021-01-09 NOTE — ED Triage Notes (Signed)
Patient BIB [facility] for evaluation of pacemaker. EMS states patient was at [facility] for dermatology appointment when practitioner at [facility] was concerned about the absence of pacer spikes on patient's EKG. Patient's heart rate 70, denies complaints.

## 2021-01-09 NOTE — Telephone Encounter (Signed)
Spoke with Dr Sherral Hammers who reports pt was being seen at [facility] today for dermatology appointment.  Pt was sitting in waiting area and experienced a syncopal episode.  EKG shows sinus rhythm with rate of 70.  Dr Sherral Hammers was concerned that there was no "sensing" and pt most likely experienced run of VT but pt denied shock.  Pt is scheduled for gen change on 01/20/2021 with Dr [name].    Spoke with Amy T, device clinic,RN who states pt is not paced so Dr would not see pacer spikes.  Dr Quentin Ore, who is EP in the hospital made aware pt is in ED at the request of Dr Sherral Hammers d/t Dr [name] being out of the office.

## 2021-01-09 NOTE — Telephone Encounter (Signed)
Dr. Sherral Hammers from [facility] requesting to speak with Dr. [name] or his nurse in regards to this pt. Phone transferred to [facility].

## 2021-01-09 NOTE — ED Provider Notes (Signed)
[facility] EMERGENCY DEPARTMENT Provider Note   CSN: [id number] Arrival date & time: 01/09/21  1441     History Chief Complaint  Patient presents with  . Chest Pain    Corey Tyler is a 80 y.o. male with PMHx HTN, HLD, COPD on chronic home O2 (4L), CHF, A fib, CAD s/p MI, and hx pacemaker/AICD who presents to the ED today via EMS for evaluation of pacemaker.  He reports he was at a dermatology appointment at the [facility] when he fell like he could pass out.  He states that this will occur when he goes into V. tach.  He requested that the nurse give him 200 mg amiodarone as this typically will help.  They did an EKG at the [place] and they physician did not see any pacemaker spikes causing concern and decided to bring patient to the ED for further evaluation. There is a telephone encounter in the system with pt's cardiologist/device clinic RN who stated that pt is not paced and therefore pacemaker spikes would not be seen. Pt is scheduled for a gen change on 02/28 with Dr. [name].   Pt has no complaints at this time. He states that the near syncope lasted a few seconds before dissipating. He did not feel his defibrillator go off. He denies chest pain, worsening SOB from baseline, dizziness/lightheadedness currently, or any other associated symptoms.   The history is provided by the patient, medical records and the EMS personnel.       Past Medical History:  Diagnosis Date  . A-fib ([place])    a. Noted on 12/2012 interrogation, placed on Apixaban and ultimately stopped NOACs 2/2 personal decision 8/14.  Marland Kitchen AICD (automatic cardioverter/defibrillator) present    Cardiac defibrillator -dual  [employer]  . Arthritis   . Atrial tachycardia-non sustained    a. Noted on 03/2012 interrogation.  . Chronic systolic heart failure (HCC)    EF down to 20 to 25% per echo November 2012  . COPD (chronic obstructive pulmonary disease) ([place])   . Hyperlipidemia   . Hypertension   . ICD (implantable  cardiac defibrillator) in place   . NICM (nonischemic cardiomyopathy) ([place])    a. Normal cors 2000. b. Minimal plaque 2013.  . Old myocardial infarction    a. ?Silent MI. b. Large fixed inferolateral defect c/w with prior infarct, no ischemia. c. Cath in 2000/2013 with only minimal CAD.  Marland Kitchen Presence of permanent cardiac pacemaker   . Pulmonary nodule, right    Last scan in 2010 showing stability; felt to be benign.  Marland Kitchen PVC's (premature ventricular contractions)   . Ventricular tachycardia, polymorphic ([place])    Rx via ICD 12/14    Patient Active Problem List   Diagnosis Date Noted  . Syncope 12/09/2020  . Educated about COVID-19 virus infection 05/13/2020  . Orthostatic hypotension 12/29/2019  . NSVT (nonsustained ventricular tachycardia) ([place]) 12/29/2019  . Tremor 10/18/2019  . Hypomagnesemia 09/07/2019  . Hypokalemia 09/07/2019  . CKD (chronic kidney disease) stage 3, GFR 30-59 ml/min ([place]) 09/02/2019  . SBO (small bowel obstruction) ([place]) 08/31/2019  . Chronic respiratory failure with hypoxia ([place]) 08/31/2019  . Chronic anticoagulation 08/31/2019  . Hypothyroidism 08/31/2019  . Chronic nausea 08/31/2019  . Generalized weakness 07/29/2019  . Acute respiratory failure ([place]) 09/15/2017  . COPD exacerbation ([place]) 09/15/2017  . Sustained ventricular tachycardia ([place]) 06/10/2017  . VT (ventricular tachycardia) ([place]) 11/02/2016  . Benign fibroma of prostate 04/02/2015  . Ventricular tachycardia, polymorphic ([place])   .  Diverticulosis of colon without hemorrhage 09/25/2013  . Atrial fibrillation ([place]) 12/29/2012  . ICD (implantable cardioverter-defibrillator) in place   . Hyperlipidemia   . PVCs 11/26/2011  . Chronic systolic heart failure ([place]) 10/07/2011  . Cardiomyopathy, nonischemic and ischemic 09/24/2011  . 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 PULMONARY NODULE 12/04/2008    Past Surgical History:  Procedure Laterality Date  .  APPENDECTOMY    . CARDIAC CATHETERIZATION  2000  . CARDIAC DEFIBRILLATOR PLACEMENT    . CARDIOVERSION  03/30/2013   Procedure: CARDIOVERSION;  Surgeon: Thayer Headings, MD;  Location: [facility];  Service: Cardiovascular;;  . COLON SURGERY    . COLONOSCOPY N/A 09/25/2013   Procedure: COLONOSCOPY;  Surgeon: Jerene Bears, MD;  Location: [facility];  Service: Endoscopy;  Laterality: N/A;  . ICD  12/2011   [name]  . IMPLANTABLE CARDIOVERTER DEFIBRILLATOR IMPLANT N/A 01/06/2012   Procedure: IMPLANTABLE CARDIOVERTER DEFIBRILLATOR IMPLANT;  Surgeon: Deboraha Sprang, MD;  Location: [facility] CATH LAB;  Service: Cardiovascular;  Laterality: N/A;  . PACEMAKER IMPLANT     [employer] [name]  . TEE WITHOUT CARDIOVERSION N/A 03/30/2013   Procedure: TRANSESOPHAGEAL ECHOCARDIOGRAM (TEE);  Surgeon: Thayer Headings, MD;  Location: [place];  Service: Cardiovascular;  Laterality: N/A;  . V TACH ABLATION  06/10/2017  . V TACH ABLATION N/A 06/10/2017   Procedure: V Tach Ablation;  Surgeon: Evans Lance, MD;  Location: [place] CV LAB;  Service: Cardiovascular;  Laterality: N/A;       Family History  Problem Relation Age of Onset  . Emphysema Mother   . Heart disease Mother        AVR  . Heart failure Father        Died agwe 69  . Esophageal cancer Brother   . Colon cancer Neg Hx     Social History   Tobacco Use  . Smoking status: Former Smoker    Packs/day: 2.50    Years: 40.00    Pack years: 100.00    Types: Cigarettes    Quit date: 11/24/1983    Years since quitting: 37.1  . Smokeless tobacco: Never Used  . Tobacco comment: 2 1/2 ppd x 40 years  Vaping Use  . Vaping Use: Never used  Substance Use Topics  . Alcohol use: No  . Drug use: No    Home Medications Prior to Admission medications   Medication Sig Start Date End Date Taking? Authorizing Provider  acetaminophen (TYLENOL) 500 MG tablet Take 500 mg by mouth every 6 (six) hours as needed for headache (pain).    [provider]   albuterol (PROVENTIL) (2.5 MG/3ML) 0.083% nebulizer solution Take 2.5 mg by nebulization every 6 (six) hours as needed for wheezing or shortness of breath.    [provider]  albuterol (VENTOLIN HFA) 108 (90 Base) MCG/ACT inhaler Inhale 2 puffs into the lungs every 6 (six) hours as needed for wheezing or shortness of breath.    [provider]  amiodarone (PACERONE) 200 MG tablet Take 1 tablet (200 mg total) by mouth 2 (two) times daily. 12/11/20   Thurnell Lose, MD  budesonide-formoterol (SYMBICORT) 160-4.5 MCG/ACT inhaler Inhale 2 puffs into the lungs 2 (two) times daily.    [provider]  Carboxymethylcellul-Glycerin (LUBRICATING EYE DROPS OP) Place 1 drop into both eyes daily.    [provider]  cholecalciferol (VITAMIN D) 1000 UNITS tablet Take 1,000 Units by mouth daily.  [provider]  ELIQUIS 5 MG TABS tablet TAKE 1 TABLET BY MOUTH TWICE A DAY 07/30/15   Minus Breeding, MD  finasteride (PROSCAR) 5 MG tablet Take 5 mg by mouth daily.     [provider]  levothyroxine (SYNTHROID, LEVOTHROID) 125 MCG tablet Take 125 mcg by mouth daily before breakfast.  06/15/17   [provider]  MAGNESIUM PO Take 420 mg by mouth at bedtime.    [provider]  Melatonin 3 MG TABS Take 3 mg by mouth at bedtime.    [provider]  methylPREDNISolone (MEDROL DOSEPAK) 4 MG TBPK tablet follow package directions 12/11/20   Thurnell Lose, MD  metoprolol succinate (TOPROL XL) 25 MG 24 hr tablet TAKE 1/2 TABLET BID 12/19/20   Baldwin Jamaica, PA-C  Multiple Vitamins-Minerals (MULTIVITAMIN WITH MINERALS) tablet Take 1 tablet by mouth daily. Mens 50 plus    [provider]  nitroGLYCERIN (NITROSTAT) 0.4 MG SL tablet Place 0.4 mg under the tongue every 5 (five) minutes as needed for chest pain.  11/12/11   Burtis Junes, NP  Omega-3 Fatty Acids (FISH OIL) 1000 MG CPDR Take 1,000 mg by mouth daily.    [provider]  OXYGEN Inhale 3-4 L into the lungs See admin instructions. 3 L at rest and 4 L with exertion    [provider]  potassium chloride (MICRO-K) 10 MEQ CR capsule Take 10 mEq by mouth at bedtime.    [provider]  promethazine (PHENERGAN) 25 MG tablet Take 25 mg by mouth daily as needed for nausea or vomiting.  04/20/17   [provider]  Psyllium (METAMUCIL PO) Take 15 mLs by mouth daily. Mix in liquid and drink    [provider]  ranolazine (RANEXA) 1000 MG SR tablet Take 1 tablet (1,000 mg total) by mouth 2 (two) times daily. 09/23/18   Deboraha Sprang, MD  sodium chloride (OCEAN) 0.65 % SOLN nasal spray Place 1 spray into both nostrils daily as needed for congestion.    [provider]  tamsulosin (FLOMAX) 0.4 MG CAPS capsule Take 0.4 mg by mouth at bedtime.    [provider]  Tiotropium Bromide Monohydrate (SPIRIVA RESPIMAT) 2.5 MCG/ACT AERS Inhale 2 puffs into the lungs daily.    [provider]  torsemide (DEMADEX) 20 MG tablet Take 1 tablet (20 mg total) by mouth daily. Take 1 tablet by mouth daily and 1 1/2 tablet on  Wed Sat Sun and 12/19/20   Baldwin Jamaica, PA-C    Allergies    Atorvastatin, Statins, Xarelto [rivaroxaban], Adhesive [tape], Codeine, Isosorbide nitrate, Latex, Levofloxacin, Lisinopril, Lorazepam, Mexiletine, and Sertraline  Review of Systems   Review of Systems  Constitutional: Negative for chills and fever.  Respiratory: Negative for shortness of breath.   Cardiovascular: Negative for chest pain.  Neurological: Negative for weakness, numbness and headaches.       + near syncope  All other systems reviewed and are negative.   Physical Exam Updated Vital Signs BP 124/72 (BP Location: Left Arm)   Pulse 70   Temp 98.2 F (36.8 C) (Oral)   Resp 18   SpO2 90%   Physical Exam Vitals and nursing note reviewed.  Constitutional:      Appearance: He is not ill-appearing or  diaphoretic.  HENT:     Head: Normocephalic and atraumatic.  Eyes:     Conjunctiva/sclera: Conjunctivae normal.  Cardiovascular:     Rate and Rhythm: Normal  rate and regular rhythm.     Pulses:          Radial pulses are 2+ on the right side and 2+ on the left side.       Dorsalis pedis pulses are 2+ on the right side and 2+ on the left side.     Heart sounds: Normal heart sounds.  Pulmonary:     Effort: Pulmonary effort is normal.     Breath sounds: Normal breath sounds. No decreased breath sounds, wheezing, rhonchi or rales.  Chest:     Chest wall: No tenderness.  Abdominal:     Palpations: Abdomen is soft.     Tenderness: There is no abdominal tenderness. There is no guarding or rebound.  Musculoskeletal:     Cervical back: Neck supple.     Right lower leg: No edema.     Left lower leg: No edema.  Skin:    General: Skin is warm and dry.  Neurological:     Mental Status: He is alert and oriented to person, place, and time.     Motor: No weakness.     ED Results / Procedures / Treatments   Labs (all labs ordered are listed, but only abnormal results are displayed) Labs Reviewed  BASIC METABOLIC PANEL - Abnormal; Notable for the following components:      Result Value   Glucose, Bld 105 (*)    Creatinine, Ser 1.56 (*)    Calcium 8.6 (*)    GFR, Estimated 45 (*)    All other components within normal limits  CBC - Abnormal; Notable for the following components:   RBC 3.93 (*)    Hemoglobin 12.7 (*)    MCV 100.5 (*)    RDW 15.9 (*)    All other components within normal limits  TROPONIN I (HIGH SENSITIVITY)  TROPONIN I (HIGH SENSITIVITY)    EKG EKG Interpretation  Date/Time:  Thursday January 09 2021 14:44:27 EST Ventricular Rate:  70 PR Interval:  180 QRS Duration: 116 QT Interval:  442 QTC Calculation: 477 R Axis:   9 Text Interpretation: Normal sinus rhythm Normal ECG No significant change since last tracing Confirmed by Calvert Cantor [phone]) on  01/09/2021 6:30:11 PM   Radiology DG Chest 2 View  Result Date: 01/09/2021 CLINICAL DATA:  Chest pain EXAM: CHEST - 2 VIEW COMPARISON:  12/09/2020, CT 12/12/2008, radiograph 12/29/2019, 09/14/2019 FINDINGS: Left-sided pacing device as before. Emphysematous disease and chronic bronchitic changes. Atelectasis or scarring at the bases. Patchy consolidation lingula and left base. Stable cardiomediastinal silhouette with aortic atherosclerosis. No pneumothorax. IMPRESSION: Patchy consolidation at the lingula and left base, possible pneumonia. Underlying emphysematous disease and chronic bronchitic changes. Electronically Signed   By: Donavan Foil M.D.   On: 01/09/2021 15:44    Procedures Procedures   Medications Ordered in ED Medications - No data to display  ED Course  I have reviewed the triage vital signs and the nursing notes.  Pertinent labs & imaging results that were available during my care of the patient were reviewed by me and considered in my medical decision making (see chart for details).    MDM Rules/Calculators/A&P                          80 year old male presenting to the ED for pacemaker eval. He had a near syncopal episode at the [facility] dermatology office today and was sent here as the physician did not see any  pacer spikes on his EKG. Physician at [facility] spoke with pt's cardiology office - was advised that pt is not paced and therefore no pacer spikes would be appreciated however physician wanted pt to be evaluated in the ED. Pt has gen change scheduled on 2/28. He has no current complaints. Denies chest pain or SOB during pre syncopal episode; none currently as well. On arrival to the ED VSS. Pt appears to be in NAD. Physical exam without abnormalities. Pt alert and oriented x 4, moving all extremities without difficulty. EKG obtained while in the waiting room with acute ischemic changes. CXR obtained which shows pacemaker/defibrillator in place with correct lead placement. CBC  without leukocytosis and hgb stable at 12.7. BMP with creatinine slightly elevated at 1.56 compared to last month however per chart review pt's creatinine ranging from 1.1-1.7/1.8. No other electrolyte abnormalities. Troponin 14.   Lab Results  Component Value Date   CREATININE 1.56 (H) 01/09/2021   CREATININE 1.20 12/11/2020   CREATININE 1.20 12/10/2020   Will plan to interrogate pacemaker and if no acute findings feel pt can be discharged home.   Repeat troponin 13 Orthostatics unremarkable at this time.   Pacemaker/defibrillator interrogated. I personally viewed the results of the report - no events today or since last interrogation on 12/19/20. Feel pt is stable for discharge at this time with cardiology follow up. Attending physician Dr. Karle Starch evaluated patient as well and agrees with plan.   This note was prepared using Dragon voice recognition software and may include unintentional dictation errors due to the inherent limitations of voice recognition software.  Final Clinical Impression(s) / ED Diagnoses Final diagnoses:  Encounter for interrogation of cardiac pacemaker  Pre-syncope    Rx / DC Orders ED Discharge Orders    None       Discharge Instructions     Your workup was overall reassuring in the ED today. Your pacemaker/defibrillator did not show any abnormalities during interrogation today.   Please follow up with both your PCP and your cardiologist Dr. [name] regarding your ED visit today  Return to the ED for any worsening symptoms        Eustaquio Maize, Hershal Coria 01/09/21 2051    Truddie Hidden, MD 01/09/21 2158

## 2021-01-16 ENCOUNTER — Ambulatory Visit: Payer: PPO | Admitting: Orthopedic Surgery

## 2021-01-17 ENCOUNTER — Other Ambulatory Visit (HOSPITAL_COMMUNITY): Payer: PPO

## 2021-01-17 NOTE — Progress Notes (Signed)
Instructed patient on the following items: Arrival time 1000 Nothing to eat or drink after midnight No meds AM of procedure Responsible person to drive you home and stay with you for 24 hrs Wash with special soap night before and morning of procedure If on anti-coagulant drug instructions Eliquis- hold Sunday nights dose and Monday mornings dose

## 2021-01-20 ENCOUNTER — Ambulatory Visit (HOSPITAL_COMMUNITY)
Admission: RE | Admit: 2021-01-20 | Discharge: 2021-01-20 | Disposition: A | Discharge status: Home / Self Care | Payer: PPO | Attending: Internal Medicine | Admitting: Internal Medicine

## 2021-01-20 ENCOUNTER — Ambulatory Visit: Payer: PPO

## 2021-01-20 ENCOUNTER — Encounter (HOSPITAL_COMMUNITY)
Admission: RE | Disposition: A | Discharge status: Home / Self Care | Payer: PPO | Source: Home / Self Care | Attending: Internal Medicine

## 2021-01-20 ENCOUNTER — Other Ambulatory Visit: Payer: Self-pay

## 2021-01-20 DIAGNOSIS — I428 Other cardiomyopathies: Secondary | ICD-10-CM | POA: Insufficient documentation

## 2021-01-20 DIAGNOSIS — Z79899 Other long term (current) drug therapy: Secondary | ICD-10-CM | POA: Diagnosis not present

## 2021-01-20 DIAGNOSIS — Z881 Allergy status to other antibiotic agents status: Secondary | ICD-10-CM | POA: Diagnosis not present

## 2021-01-20 DIAGNOSIS — I472 Ventricular tachycardia: Secondary | ICD-10-CM | POA: Insufficient documentation

## 2021-01-20 DIAGNOSIS — I4589 Other specified conduction disorders: Secondary | ICD-10-CM | POA: Diagnosis not present

## 2021-01-20 DIAGNOSIS — Z91040 Latex allergy status: Secondary | ICD-10-CM | POA: Insufficient documentation

## 2021-01-20 DIAGNOSIS — Z885 Allergy status to narcotic agent status: Secondary | ICD-10-CM | POA: Diagnosis not present

## 2021-01-20 DIAGNOSIS — Z9981 Dependence on supplemental oxygen: Secondary | ICD-10-CM | POA: Insufficient documentation

## 2021-01-20 DIAGNOSIS — I252 Old myocardial infarction: Secondary | ICD-10-CM | POA: Diagnosis not present

## 2021-01-20 DIAGNOSIS — U071 COVID-19: Secondary | ICD-10-CM | POA: Diagnosis not present

## 2021-01-20 DIAGNOSIS — N289 Disorder of kidney and ureter, unspecified: Secondary | ICD-10-CM | POA: Diagnosis not present

## 2021-01-20 DIAGNOSIS — I5022 Chronic systolic (congestive) heart failure: Secondary | ICD-10-CM | POA: Diagnosis not present

## 2021-01-20 DIAGNOSIS — I959 Hypotension, unspecified: Secondary | ICD-10-CM | POA: Diagnosis not present

## 2021-01-20 DIAGNOSIS — E785 Hyperlipidemia, unspecified: Secondary | ICD-10-CM | POA: Diagnosis not present

## 2021-01-20 DIAGNOSIS — Z7901 Long term (current) use of anticoagulants: Secondary | ICD-10-CM | POA: Insufficient documentation

## 2021-01-20 DIAGNOSIS — Z8249 Family history of ischemic heart disease and other diseases of the circulatory system: Secondary | ICD-10-CM | POA: Diagnosis not present

## 2021-01-20 DIAGNOSIS — Z87891 Personal history of nicotine dependence: Secondary | ICD-10-CM | POA: Diagnosis not present

## 2021-01-20 DIAGNOSIS — Z4502 Encounter for adjustment and management of automatic implantable cardiac defibrillator: Secondary | ICD-10-CM

## 2021-01-20 DIAGNOSIS — Z7951 Long term (current) use of inhaled steroids: Secondary | ICD-10-CM | POA: Diagnosis not present

## 2021-01-20 DIAGNOSIS — Z888 Allergy status to other drugs, medicaments and biological substances status: Secondary | ICD-10-CM | POA: Insufficient documentation

## 2021-01-20 DIAGNOSIS — Z79890 Hormone replacement therapy (postmenopausal): Secondary | ICD-10-CM | POA: Diagnosis not present

## 2021-01-20 DIAGNOSIS — I480 Paroxysmal atrial fibrillation: Secondary | ICD-10-CM | POA: Insufficient documentation

## 2021-01-20 DIAGNOSIS — I110 Hypertensive heart disease with heart failure: Secondary | ICD-10-CM | POA: Diagnosis not present

## 2021-01-20 DIAGNOSIS — I255 Ischemic cardiomyopathy: Secondary | ICD-10-CM

## 2021-01-20 HISTORY — PX: ICD GENERATOR CHANGEOUT: EP1231

## 2021-01-20 LAB — CUP PACEART REMOTE DEVICE CHECK
Battery Remaining Longevity: 0 mo
Battery Voltage: 2.57 V
Brady Statistic AP VP Percent: 1 %
Brady Statistic AP VS Percent: 54 %
Brady Statistic AS VP Percent: 1 %
Brady Statistic AS VS Percent: 45 %
Brady Statistic RA Percent Paced: 53 %
Brady Statistic RV Percent Paced: 1 %
Date Time Interrogation Session: 20220228020016
HighPow Impedance: 80 Ohm
HighPow Impedance: 80 Ohm
Implantable Pulse Generator Implant Date: 20130213
Lead Channel Impedance Value: 390 Ohm
Lead Channel Impedance Value: 450 Ohm
Lead Channel Pacing Threshold Amplitude: 0.5 V
Lead Channel Pacing Threshold Amplitude: 0.75 V
Lead Channel Pacing Threshold Pulse Width: 0.5 ms
Lead Channel Pacing Threshold Pulse Width: 0.6 ms
Lead Channel Sensing Intrinsic Amplitude: 12 mV
Lead Channel Sensing Intrinsic Amplitude: 2.4 mV
Lead Channel Setting Pacing Amplitude: 1.5 V
Lead Channel Setting Pacing Amplitude: 2.5 V
Lead Channel Setting Pacing Pulse Width: 0.6 ms
Lead Channel Setting Sensing Sensitivity: 0.5 mV
Pulse Gen Serial Number: 7003419

## 2021-01-20 LAB — SARS CORONAVIRUS 2 BY RT PCR (HOSPITAL ORDER, PERFORMED IN ~~LOC~~ HOSPITAL LAB): SARS Coronavirus 2: POSITIVE — AB

## 2021-01-20 SURGERY — ICD GENERATOR CHANGEOUT
Anesthesia: LOCAL

## 2021-01-20 MED ORDER — CHLORHEXIDINE GLUCONATE 4 % EX LIQD: 4.0000 "application " | Freq: Once | CUTANEOUS | Status: DC

## 2021-01-20 MED ORDER — CEFAZOLIN SODIUM-DEXTROSE 2-4 GM/100ML-% IV SOLN
2.0000 g | INTRAVENOUS | Status: AC
  Administered 2021-01-20: 2 g via INTRAVENOUS

## 2021-01-20 MED ORDER — SODIUM CHLORIDE 0.9 % IV SOLN
INTRAVENOUS | Status: AC
  Filled 2021-01-20: qty 2

## 2021-01-20 MED ORDER — CEFAZOLIN SODIUM-DEXTROSE 2-4 GM/100ML-% IV SOLN
INTRAVENOUS | Status: AC
  Filled 2021-01-20: qty 100

## 2021-01-20 MED ORDER — ACETAMINOPHEN 325 MG PO TABS: 325.0000 mg | ORAL_TABLET | ORAL | Status: DC | PRN

## 2021-01-20 MED ORDER — SODIUM CHLORIDE 0.9 % IV SOLN
80.0000 mg | INTRAVENOUS | Status: AC
  Administered 2021-01-20: 80 mg
  Filled 2021-01-20: qty 2

## 2021-01-20 MED ORDER — SODIUM CHLORIDE 0.9 % IV SOLN: INTRAVENOUS | Status: DC

## 2021-01-20 MED ORDER — LIDOCAINE HCL (PF) 1 % IJ SOLN
INTRAMUSCULAR | Status: DC | PRN
  Administered 2021-01-20: 60 mL

## 2021-01-20 MED ORDER — LIDOCAINE HCL (PF) 1 % IJ SOLN
INTRAMUSCULAR | Status: AC
  Filled 2021-01-20: qty 60

## 2021-01-20 MED ORDER — POVIDONE-IODINE 10 % EX SWAB
2.0000 "application " | Freq: Once | CUTANEOUS | Status: AC
  Administered 2021-01-20: 2 via TOPICAL

## 2021-01-20 SURGICAL SUPPLY — 9 items
CABLE SURGICAL S-101-97-12 (CABLE) ×2 IMPLANT
DEVICE DISSECT PLASMABLAD 3.0S (MISCELLANEOUS) IMPLANT
HEMOSTAT SURGICEL 2X4 FIBR (HEMOSTASIS) ×1 IMPLANT
ICD GALLANT DR CDDRA500Q (ICD Generator) ×1 IMPLANT
PAD PRO RADIOLUCENT 2001M-C (PAD) ×2 IMPLANT
PLASMABLADE 3.0S (MISCELLANEOUS) ×2
POUCH AIGIS-R ANTIBACT PPM (Mesh General) ×2 IMPLANT
POUCH AIGIS-R ANTIBACT PPM MED (Mesh General) IMPLANT
TRAY PACEMAKER INSERTION (PACKS) ×2 IMPLANT

## 2021-01-20 NOTE — Discharge Instructions (Signed)
Implantable Cardiac Device Battery Change, Care After  This sheet gives you information about how to care for yourself after your procedure. Your health care provider may also give you more specific instructions. If you have problems or questions, contact your health care provider. What can I expect after the procedure? After your procedure, it is common to have:  Pain or soreness at the site where the cardiac device was inserted.  Swelling at the site where the cardiac device was inserted.  You should received an information card for your new device in 4-8 weeks. Follow these instructions at home: Incision care   Keep the incision clean and dry. ? Do not take baths, swim, or use a hot tub until after your wound check.  ? Do not shower for at least 7 days, or as directed by your health care provider. You may shower 3/1 evening ? Pat the area dry with a clean towel. Do not rub the area. This may cause bleeding.  Follow instructions from your health care provider about how to take care of your incision. Make sure you: ? Leave stitches (sutures), skin glue, or adhesive strips in place. These skin closures may need to stay in place for 2 weeks or longer. If adhesive strip edges start to loosen and curl up, you may trim the loose edges. Do not remove adhesive strips completely unless your health care provider tells you to do that.  Check your incision area every day for signs of infection. Check for: ? More redness, swelling, or pain. ? More fluid or blood. ? Warmth. ? Pus or a bad smell. Activity  Do not lift anything that is heavier than 10 lb (4.5 kg) until your health care provider says it is okay to do so. 5 days  For the first week, or as long as told by your health care provider: ? Avoid lifting your affected arm higher than your shoulder. ? After 1 week, Be gentle when you move your arms over your head. It is okay to raise your arm to comb your hair. ? Avoid strenuous  exercise.  Ask your health care provider when it is okay to: ? Resume your normal activities. ? Return to work or school. ? Resume sexual activity. Eating and drinking  Eat a heart-healthy diet. This should include plenty of fresh fruits and vegetables, whole grains, low-fat dairy products, and lean protein like chicken and fish.  Limit alcohol intake to no more than 1 drink a day for non-pregnant women and 2 drinks a day for men. One drink equals 12 oz of beer, 5 oz of wine, or 1 oz of hard liquor.  Check ingredients and nutrition facts on packaged foods and beverages. Avoid the following types of food: ? Food that is high in salt (sodium). ? Food that is high in saturated fat, like full-fat dairy or red meat. ? Food that is high in trans fat, like fried food. ? Food and drinks that are high in sugar. Lifestyle  Do not use any products that contain nicotine or tobacco, such as cigarettes and e-cigarettes. If you need help quitting, ask your health care provider.  Take steps to manage and control your weight.  Once cleared, get regular exercise. Aim for 150 minutes of moderate-intensity exercise (such as walking or yoga) or 75 minutes of vigorous exercise (such as running or swimming) each week.  Manage other health problems, such as diabetes or high blood pressure. Ask your health care provider how you can  manage these conditions. General instructions  Do not drive for 24 hours after your procedure if you were given a medicine to help you relax (sedative).  Take over-the-counter and prescription medicines only as told by your health care provider.  Avoid putting pressure on the area where the cardiac device was placed.  If you need an MRI after your cardiac device has been placed, be sure to tell the health care provider who orders the MRI that you have a cardiac device.  Avoid close and prolonged exposure to electrical devices that have strong magnetic fields. These  include: ? Cell phones. Avoid keeping them in a pocket near the cardiac device, and try using the ear opposite the cardiac device. ? MP3 players. ? Household appliances, like microwaves. ? Metal detectors. ? Electric generators. ? High-tension wires.  Keep all follow-up visits as directed by your health care provider. This is important. Contact a health care provider if:  You have pain at the incision site that is not relieved by over-the-counter or prescription medicines.  You have any of these around your incision site or coming from it: ? More redness, swelling, or pain. ? Fluid or blood. ? Warmth to the touch. ? Pus or a bad smell.  You have a fever.  You feel brief, occasional palpitations, light-headedness, or any symptoms that you think might be related to your heart. Get help right away if:  You experience chest pain that is different from the pain at the cardiac device site.  You develop a red streak that extends above or below the incision site.  You experience shortness of breath.  You have palpitations or an irregular heartbeat.  You have light-headedness that does not go away quickly.  You faint or have dizzy spells.  Your pulse suddenly drops or increases rapidly and does not return to normal.  You begin to gain weight and your legs and ankles swell. Summary  After your procedure, it is common to have pain, soreness, and some swelling where the cardiac device was inserted.  Make sure to keep your incision clean and dry. Follow instructions from your health care provider about how to take care of your incision.  Check your incision every day for signs of infection, such as more pain or swelling, pus or a bad smell, warmth, or leaking fluid and blood.  Avoid strenuous exercise and lifting your left arm higher than your shoulder for 2 weeks, or as long as told by your health care provider. This information is not intended to replace advice given to you by  your health care provider. Make sure you discuss any questions you have with your health care provider.

## 2021-01-20 NOTE — H&P (Signed)
Patient Care Team: [name], MD as PCP - General (Internal Medicine) Minus Breeding, MD as PCP - Cardiology (Cardiology) Deboraha Sprang, MD as PCP - Electrophysiology (Cardiology)   HPI  Corey Tyler is a 80 y.o. male Admitted for ICD generator replacment in setting on ICM  Originally implanted 2013 for primary prevention with appropriate therapy 12/14 >>VT-PM Recurrent VT 2017>>amio and ablation (GT) with recurrent VT Rx now with amio and ranolazine  Significant dyspnea and some emphysema   Recent episode of syncope and presyncope concerning for ventricular tachycardia.  He was seen by Dr. At the [place] who thought maybe he was in ventricular tachycardia.  Unfortunately tracings are not available.  Nothing was detected on his device; it is notable that he has had VT below his detection rates before.  Back at. Functional baseline    DATE TEST EF   2008 echo  40-45%   1/14 echo    15 %   8/17 echo 25 %   10/18 Echo   45-50%   9/20 Echo  25-30%   1/22 Echo  30-35%       Date Cr K TSH LFTs Hgb PFTs  5/15    8.63 23    12/17    4.55 18    10/18   1.603 27    2/19   4.03 17 14.1   2/21 1.34 3.8 1.48 20(10/20) 12.8   9/21 1.50 4.0 2.68 17 12.6      Records and Results Reviewed   Past Medical History:  Diagnosis Date  . A-fib ([place])    a. Noted on 12/2012 interrogation, placed on Apixaban and ultimately stopped NOACs 2/2 personal decision 8/14.  Marland Kitchen AICD (automatic cardioverter/defibrillator) present    Cardiac defibrillator -dual  [employer] Judes  . Arthritis   . Atrial tachycardia-non sustained    a. Noted on 03/2012 interrogation.  . Chronic systolic heart failure (HCC)    EF down to 20 to 25% per echo November 2012  . COPD (chronic obstructive pulmonary disease) ([place])   . Hyperlipidemia   . Hypertension   . ICD (implantable cardiac defibrillator) in place   . NICM (nonischemic cardiomyopathy) ([place])    a. Normal cors 2000. b. Minimal  plaque 2013.  . Old myocardial infarction    a. ?Silent MI. b. Large fixed inferolateral defect c/w with prior infarct, no ischemia. c. Cath in 2000/2013 with only minimal CAD.  Marland Kitchen Presence of permanent cardiac pacemaker   . Pulmonary nodule, right    Last scan in 2010 showing stability; felt to be benign.  Marland Kitchen PVC's (premature ventricular contractions)   . Ventricular tachycardia, polymorphic ([place])    Rx via ICD 12/14    Past Surgical History:  Procedure Laterality Date  . APPENDECTOMY    . CARDIAC CATHETERIZATION  2000  . CARDIAC DEFIBRILLATOR PLACEMENT    . CARDIOVERSION  03/30/2013   Procedure: CARDIOVERSION;  Surgeon: Thayer Headings, MD;  Location: [place];  Service: Cardiovascular;;  . COLON SURGERY    . COLONOSCOPY N/A 09/25/2013   Procedure: COLONOSCOPY;  Surgeon: Jerene Bears, MD;  Location: [facility];  Service: Endoscopy;  Laterality: N/A;  . ICD  12/2011   [name]  . IMPLANTABLE CARDIOVERTER DEFIBRILLATOR IMPLANT N/A 01/06/2012   Procedure: IMPLANTABLE CARDIOVERTER DEFIBRILLATOR IMPLANT;  Surgeon: Deboraha Sprang, MD;  Location: [facility] CATH LAB;  Service: Cardiovascular;  Laterality: N/A;  . PACEMAKER IMPLANT     St [name]  .  TEE WITHOUT CARDIOVERSION N/A 03/30/2013   Procedure: TRANSESOPHAGEAL ECHOCARDIOGRAM (TEE);  Surgeon: Thayer Headings, MD;  Location: [place];  Service: Cardiovascular;  Laterality: N/A;  . V TACH ABLATION  06/10/2017  . V TACH ABLATION N/A 06/10/2017   Procedure: V Tach Ablation;  Surgeon: Evans Lance, MD;  Location: [place] CV LAB;  Service: Cardiovascular;  Laterality: N/A;    Current Facility-Administered Medications  Medication Dose Route Frequency Provider Last Rate Last Admin  . 0.9 %  sodium chloride infusion   Intravenous Continuous Deboraha Sprang, MD      . ceFAZolin (ANCEF) IVPB 2g/100 mL premix  2 g Intravenous On Call Deboraha Sprang, MD      . chlorhexidine (HIBICLENS) 4 % liquid 4 application  4 application Topical Once Deboraha Sprang, MD      . gentamicin (GARAMYCIN) 80 mg in sodium chloride 0.9 % 500 mL irrigation  80 mg Irrigation To Cath Deboraha Sprang, MD      . povidone-iodine 10 % swab 2 application  2 application Topical Once Deboraha Sprang, MD        Allergies  Allergen Reactions  . Atorvastatin Shortness Of Breath and Cough  . Statins Shortness Of Breath and Other (See Comments)    Muscle and joing pain and "These bother my lungs and make my gait unsteady, also"  . Xarelto [Rivaroxaban] Other (See Comments)    Bleeding  . Adhesive [Tape] Hives, Itching and Rash  . Codeine Nausea Only  . Isosorbide Nitrate Other (See Comments)    Made him feel bad  . Latex Itching and Other (See Comments)    Rash, itching, burning  . Levofloxacin Other (See Comments)    Tachycardia  . Lisinopril Cough  . Lorazepam Other (See Comments)    "felt bad"  . Mexiletine Other (See Comments)    GI distress  . Sertraline Other (See Comments)    Made him feel bad      Social History   Tobacco Use  . Smoking status: Former Smoker    Packs/day: 2.50    Years: 40.00    Pack years: 100.00    Types: Cigarettes    Quit date: 11/24/1983    Years since quitting: 37.1  . Smokeless tobacco: Never Used  . Tobacco comment: 2 1/2 ppd x 40 years  Vaping Use  . Vaping Use: Never used  Substance Use Topics  . Alcohol use: No  . Drug use: No     Family History  Problem Relation Age of Onset  . Emphysema Mother   . Heart disease Mother        AVR  . Heart failure Father        Died agwe 70  . Esophageal cancer Brother   . Colon cancer Neg Hx      Current Meds  Medication Sig  . acetaminophen (TYLENOL) 500 MG tablet Take 500 mg by mouth every 6 (six) hours as needed for headache (pain).  Marland Kitchen albuterol (PROVENTIL) (2.5 MG/3ML) 0.083% nebulizer solution Take 2.5 mg by nebulization every 6 (six) hours as needed for wheezing or shortness of breath.  Marland Kitchen albuterol (VENTOLIN HFA) 108 (90 Base) MCG/ACT inhaler Inhale 2  puffs into the lungs every 6 (six) hours as needed for wheezing or shortness of breath.  Marland Kitchen amiodarone (PACERONE) 200 MG tablet Take 1 tablet (200 mg total) by mouth 2 (two) times daily.  . budesonide-formoterol (SYMBICORT) 160-4.5 MCG/ACT inhaler Inhale 2  puffs into the lungs 2 (two) times daily.  . Carboxymethylcellul-Glycerin (LUBRICATING EYE DROPS OP) Place 1 drop into both eyes in the morning and at bedtime.  . cholecalciferol (VITAMIN D) 1000 UNITS tablet Take 1,000 Units by mouth daily.  Marland Kitchen ELIQUIS 5 MG TABS tablet TAKE 1 TABLET BY MOUTH TWICE A DAY (Patient taking differently: Take 5 mg by mouth 2 (two) times daily.)  . finasteride (PROSCAR) 5 MG tablet Take 5 mg by mouth daily.   Marland Kitchen levothyroxine (SYNTHROID, LEVOTHROID) 125 MCG tablet Take 125 mcg by mouth daily before breakfast.   . MAGNESIUM PO Take 420 mg by mouth at bedtime.  . Melatonin 3 MG TABS Take 3 mg by mouth at bedtime.  . metoprolol succinate (TOPROL XL) 25 MG 24 hr tablet TAKE 1/2 TABLET BID (Patient taking differently: Take 12.5 mg by mouth in the morning and at bedtime.)  . Multiple Vitamins-Minerals (MULTIVITAMIN WITH MINERALS) tablet Take 1 tablet by mouth daily. Mens 50 plus  . nitroGLYCERIN (NITROSTAT) 0.4 MG SL tablet Place 0.4 mg under the tongue every 5 (five) minutes x 3 doses as needed for chest pain.  . Omega-3 Fatty Acids (FISH OIL) 1000 MG CPDR Take 1,000 mg by mouth daily.  . OXYGEN Inhale 4 L into the lungs See admin instructions.  . potassium chloride (MICRO-K) 10 MEQ CR capsule Take 10 mEq by mouth at bedtime.  . promethazine (PHENERGAN) 25 MG tablet Take 25 mg by mouth daily as needed for nausea or vomiting.   . Psyllium (METAMUCIL PO) Take 15 g by mouth daily. Mix in liquid and drink  . ranolazine (RANEXA) 1000 MG SR tablet Take 1 tablet (1,000 mg total) by mouth 2 (two) times daily.  . sodium chloride (OCEAN) 0.65 % SOLN nasal spray Place 1 spray into both nostrils daily as needed for congestion.  .  tamsulosin (FLOMAX) 0.4 MG CAPS capsule Take 0.4 mg by mouth at bedtime.  . Tiotropium Bromide Monohydrate (SPIRIVA RESPIMAT) 2.5 MCG/ACT AERS Inhale 2 puffs into the lungs daily.  Marland Kitchen torsemide (DEMADEX) 20 MG tablet Take 1 tablet (20 mg total) by mouth daily. Take 1 tablet by mouth daily and 1 1/2 tablet on  Wed Sat Sun and (Patient taking differently: Take 20 mg by mouth See admin instructions. Take 1 tablet (20 mg) by mouth daily on Mondays, Tuesdays, Thursdays, & Fridays. Take 1.5 tablets (30 mg) by mouth daily on Wednesdays, Saturdays, & Sundays.)  . traZODone (DESYREL) 50 MG tablet Take 50 mg by mouth at bedtime as needed for sleep.     Review of Systems negative except from HPI and PMH  Physical Exam BP 128/78   Pulse 67   Temp 97.7 F (36.5 C) (Oral)   Ht 6\' 1"  (1.854 m)   Wt 88.5 kg   SpO2 94%   BMI 25.73 kg/m  Well developed and well nourished in no acute distress oxygen by cannula HENT normal E scleral and icterus clear Neck Supple JVP flat; carotids brisk and full Dry crackles  regular rate and rhythm, no murmurs gallops or rub Soft with active bowel sounds No clubbing cyanosis  Edema Alert and oriented, grossly normal motor and sensory function Skin Warm and Dry  ECG 01/01/2020 reviewed normal sinus rhythm QRS duration 114 ms 01/10/2021 QRS duration 116 ms  Assessment and  Plan  Atrial fibrillation paroxysmal/persistent   High risk medication amiodarone/ranolazine  Chronotropic incompetence   Implantable defibrillator-St [name]   Ischemic/nonischemic cardiomyopathy interval improvement  Congestive heart failure-chronic-systolic  Ventricular tachycardia--recurrent   Hypotension    Lung disease-oxygen dependent  Renal insufficiency Estimated Creatinine Clearance: 42.7 mL/min (A) (by C-G formula based on SCr of 1.56 mg/dL (H)).class 3   For generator replacement for secondary prevention.  Reviewed risks and benefits.  We will anticipate the use of  an antimicrobial pouch

## 2021-01-21 ENCOUNTER — Encounter (HOSPITAL_COMMUNITY): Payer: Self-pay | Admitting: Internal Medicine

## 2021-02-04 ENCOUNTER — Ambulatory Visit: Payer: PPO

## 2021-02-06 ENCOUNTER — Telehealth: Payer: Self-pay | Admitting: Emergency Medicine

## 2021-02-06 NOTE — Telephone Encounter (Signed)
LMOM to call Device clinic. Reviewed with Dr Lovena Le and no change in treatment at this time.

## 2021-02-06 NOTE — Telephone Encounter (Signed)
Ladies  could you reach out to him and confirm that he had NOT been on mexiletine in the past  I dont see  it in the EMR. Thanks SK

## 2021-02-06 NOTE — Telephone Encounter (Addendum)
Alert received for 2 episodes of VT that fell in the VT 1 zone at 2102 and both episodes were successfully terminated by ATP X 1. Patient reports he felt dizzy at time of events but no syncope. He reports left sided CP that lasted 2-3 seconds. No change in baseline SOB , on 3.5 - 4 liters of O2 at home. Patient reports he started taking CBD capsules 4-5 days ago but will discontinue the CBD today.Marland Kitchen He has not missed any doses of Toprol XL 25 mg 1/2 tablet BID, or Amiodorone 200 mg daily. [place] DMV driving restrictions and shock plan reviewed.ED precautions given.

## 2021-02-06 NOTE — Telephone Encounter (Signed)
Spoke with pt who states he does not recall ever taking Mexiletine.  Pt advised will forward information to Dr [name] and will contact pt with any further instructions.  Pt verbalizes understanding and thanked Therapist, sports for the call.

## 2021-02-06 NOTE — Telephone Encounter (Signed)
Then lets stop ranolzaine and start 200 mex bid  make sure he takes with food Thanks SK

## 2021-02-07 MED ORDER — RANOLAZINE ER 1000 MG PO TB12: 1000.0000 mg | ORAL_TABLET | Freq: Two times a day (BID) | ORAL | 3 refills | Status: AC

## 2021-02-11 NOTE — Telephone Encounter (Signed)
Attempted to return patients phone call. No answer, LMOVM.

## 2021-02-11 NOTE — Telephone Encounter (Signed)
Spoke with pt who states he does not wish to make any medication changes at this time as he feels "so good."  Pt reports he is currently taking Metoprolol Succinate 25mg  - 1 tablet by mouth every evening, Amiodarone 200mg  - 1 tablet by mouth daily and has continued his Ranolazine 1000mg  - 1 tablet by mouth bid. Pt advised will  forward information to Dr [name].  Pt thanked Therapist, sports for the call.

## 2021-02-11 NOTE — Telephone Encounter (Signed)
Pt returning nurse phone call. His number is [phone] and [phone].

## 2021-02-11 NOTE — Telephone Encounter (Signed)
Patient was returning phone call to cindy. Advised Dr. [name] wanted to start him on medication but was on his allergy list. Advised when we heart back from Dr. [name] we will call him to discuss plan. Patient verbalized understanding.

## 2021-02-11 NOTE — Telephone Encounter (Signed)
M  ask him for me whether if he remembers taking mex with food If not able to tolerate that then we will try quinidine 324 bid with amio

## 2021-02-11 NOTE — Telephone Encounter (Signed)
Patient states he is returning a call to [place]. He states the call was received yesterday and it was regarding the Mexiletine.

## 2021-02-12 DIAGNOSIS — I10 Essential (primary) hypertension: Secondary | ICD-10-CM | POA: Diagnosis not present

## 2021-02-12 DIAGNOSIS — E119 Type 2 diabetes mellitus without complications: Secondary | ICD-10-CM | POA: Diagnosis not present

## 2021-02-12 DIAGNOSIS — E039 Hypothyroidism, unspecified: Secondary | ICD-10-CM | POA: Diagnosis not present

## 2021-02-12 DIAGNOSIS — E785 Hyperlipidemia, unspecified: Secondary | ICD-10-CM | POA: Diagnosis not present

## 2021-02-12 NOTE — Telephone Encounter (Signed)
Noted  

## 2021-02-13 DIAGNOSIS — I714 Abdominal aortic aneurysm, without rupture, unspecified: Secondary | ICD-10-CM | POA: Insufficient documentation

## 2021-02-13 DIAGNOSIS — I2510 Atherosclerotic heart disease of native coronary artery without angina pectoris: Secondary | ICD-10-CM | POA: Insufficient documentation

## 2021-02-13 NOTE — Progress Notes (Signed)
Cardiology Office Note   Date:  02/14/2021   ID:  Cable, Fearn 1941/06/22, MRN [id number]  PCP:  [name], MD  Cardiologist:   Minus Breeding, MD   Chief Complaint  Patient presents with   Fatigue      History of Present Illness: Corey Tyler is a 80 y.o. male who presents for follow up of atrial fib and chronic systolic HF.  He had tremor and was very weak.  He was having follow up with a neurologist.  He has required reduced meds secondary to orthostatic hypotension and weakness.   EF in 2018 was 45 - 50%.  He was in the hospital in October 2020 .  There were no cardiac complications noted and no evidence of heart failure.  He was in sinus rhythm.  This was a small bowel obstruction.  I do note that his ejection fraction is down to 25 to 30% on echo in September.  We tried to treat him with Delene Loll but he really did not tolerate this with low blood pressures.    Since I last saw him he was in the hospital after a syncopal episode.  There was a question of whether he was in ventricular tachycardia but it was no evidence of there is none detected on device but if it was thought this could have been below the detection rate.  He did have his generator changed out.  Was a suggestion that he might start mexiletine but he was doing well so this was not started.   He did not want to start this as he felt good.  He now returns for follow-up.  He has since had episodes of weakness but he just does not sleep for a few hours every night.  He gets around with his rolling walker and is on oxygen continuously.  He has had no palpitations.  Has had no presyncope or syncope.  He denies any chest pressure, neck or arm discomfort.  He has had some mild increased lower extremity swelling and has taken some extra Lasix.   Past Medical History:  Diagnosis Date   A-fib [facility])    a. Noted on 12/2012 interrogation, placed on Apixaban and ultimately stopped NOACs 2/2 personal decision 8/14.    AICD (automatic cardioverter/defibrillator) present    Cardiac defibrillator -dual  [employer] Judes   Arthritis    Atrial tachycardia-non sustained    a. Noted on 03/2012 interrogation.   Chronic systolic heart failure (HCC)    EF down to 20 to 25% per echo November 2012   COPD (chronic obstructive pulmonary disease) ([place])    Hyperlipidemia    Hypertension    ICD (implantable cardiac defibrillator) in place    NICM (nonischemic cardiomyopathy) ([place])    a. Normal cors 2000. b. Minimal plaque 2013.   Old myocardial infarction    a. ?Silent MI. b. Large fixed inferolateral defect c/w with prior infarct, no ischemia. c. Cath in 2000/2013 with only minimal CAD.   Presence of permanent cardiac pacemaker    Pulmonary nodule, right    Last scan in 2010 showing stability; felt to be benign.   PVC's (premature ventricular contractions)    Ventricular tachycardia, polymorphic ([place])    Rx via ICD 12/14    Past Surgical History:  Procedure Laterality Date   [place]  03/30/2013   Procedure: CARDIOVERSION;  Surgeon: Arnette Norris  Deboraha Sprang, MD;  Location: [place];  Service: Cardiovascular;;   COLON SURGERY     COLONOSCOPY N/A 09/25/2013   Procedure: COLONOSCOPY;  Surgeon: Jerene Bears, MD;  Location: [facility];  Service: Endoscopy;  Laterality: N/A;   ICD  12/2011   [name]   ICD GENERATOR CHANGEOUT N/A 01/20/2021   Procedure: ICD GENERATOR CHANGEOUT;  Surgeon: Deboraha Sprang, MD;  Location: [place] CV LAB;  Service: Cardiovascular;  Laterality: N/A;   IMPLANTABLE CARDIOVERTER DEFIBRILLATOR IMPLANT N/A 01/06/2012   Procedure: IMPLANTABLE CARDIOVERTER DEFIBRILLATOR IMPLANT;  Surgeon: Deboraha Sprang, MD;  Location: [facility] CATH LAB;  Service: Cardiovascular;  Laterality: N/A;   PACEMAKER IMPLANT     St [name]   TEE WITHOUT CARDIOVERSION N/A 03/30/2013   Procedure: TRANSESOPHAGEAL ECHOCARDIOGRAM  (TEE);  Surgeon: Thayer Headings, MD;  Location: [place];  Service: Cardiovascular;  Laterality: N/A;   V TACH ABLATION  06/10/2017   V TACH ABLATION N/A 06/10/2017   Procedure: Stephanie Coup Ablation;  Surgeon: Evans Lance, MD;  Location: [place] CV LAB;  Service: Cardiovascular;  Laterality: N/A;     Current Outpatient Medications  Medication Sig Dispense Refill   acetaminophen (TYLENOL) 500 MG tablet Take 500 mg by mouth every 6 (six) hours as needed for headache (pain).     albuterol (PROVENTIL) (2.5 MG/3ML) 0.083% nebulizer solution Take 2.5 mg by nebulization every 6 (six) hours as needed for wheezing or shortness of breath.     albuterol (VENTOLIN HFA) 108 (90 Base) MCG/ACT inhaler Inhale 2 puffs into the lungs every 6 (six) hours as needed for wheezing or shortness of breath.     amiodarone (PACERONE) 200 MG tablet Take 1 tablet (200 mg total) by mouth 2 (two) times daily. (Patient taking differently: Take 200 mg by mouth daily.) 60 tablet 0   budesonide-formoterol (SYMBICORT) 160-4.5 MCG/ACT inhaler Inhale 2 puffs into the lungs 2 (two) times daily.     Carboxymethylcellul-Glycerin (LUBRICATING EYE DROPS OP) Place 1 drop into both eyes in the morning and at bedtime.     cholecalciferol (VITAMIN D) 1000 UNITS tablet Take 1,000 Units by mouth daily.     ELIQUIS 5 MG TABS tablet TAKE 1 TABLET BY MOUTH TWICE A DAY (Patient taking differently: Take 5 mg by mouth 2 (two) times daily.) 60 tablet 5   finasteride (PROSCAR) 5 MG tablet Take 5 mg by mouth daily.      levothyroxine (SYNTHROID, LEVOTHROID) 125 MCG tablet Take 125 mcg by mouth daily before breakfast.      MAGNESIUM PO Take 420 mg by mouth at bedtime.     Melatonin 3 MG TABS Take 3 mg by mouth at bedtime.     methylPREDNISolone (MEDROL DOSEPAK) 4 MG TBPK tablet follow package directions 21 tablet 0   metoprolol succinate (TOPROL XL) 25 MG 24 hr tablet TAKE 1/2 TABLET BID (Patient taking differently: Take 12.5 mg  by mouth in the morning and at bedtime.) 90 tablet 1   Multiple Vitamins-Minerals (MULTIVITAMIN WITH MINERALS) tablet Take 1 tablet by mouth daily. Mens 50 plus     nitroGLYCERIN (NITROSTAT) 0.4 MG SL tablet Place 0.4 mg under the tongue every 5 (five) minutes x 3 doses as needed for chest pain.     Omega-3 Fatty Acids (FISH OIL) 1000 MG CPDR Take 1,000 mg by mouth daily.     OXYGEN Inhale 4 L into the lungs See admin instructions.     potassium chloride (MICRO-K) 10 MEQ CR capsule Take  10 mEq by mouth at bedtime.     promethazine (PHENERGAN) 25 MG tablet Take 25 mg by mouth daily as needed for nausea or vomiting.      Psyllium (METAMUCIL PO) Take 15 g by mouth daily. Mix in liquid and drink     ranolazine (RANEXA) 1000 MG SR tablet Take 1 tablet (1,000 mg total) by mouth 2 (two) times daily. 180 tablet 3   sodium chloride (OCEAN) 0.65 % SOLN nasal spray Place 1 spray into both nostrils daily as needed for congestion.     tamsulosin (FLOMAX) 0.4 MG CAPS capsule Take 0.4 mg by mouth at bedtime.     Tiotropium Bromide Monohydrate (SPIRIVA RESPIMAT) 2.5 MCG/ACT AERS Inhale 2 puffs into the lungs daily.     torsemide (DEMADEX) 20 MG tablet Take 1 tablet (20 mg total) by mouth daily. Take 1 tablet by mouth daily and 1 1/2 tablet on  Wed Sat Sun and (Patient taking differently: Take 20 mg by mouth See admin instructions. Take 1 tablet (20 mg) by mouth daily on Mondays, Tuesdays, Thursdays, & Fridays. Take 1.5 tablets (30 mg) by mouth daily on Wednesdays, Saturdays, & Sundays.) 135 tablet 1   traZODone (DESYREL) 50 MG tablet Take 50 mg by mouth at bedtime as needed for sleep.     No current facility-administered medications for this visit.    Allergies:   Atorvastatin, Statins, Xarelto [rivaroxaban], Adhesive [tape], Codeine, Isosorbide nitrate, Latex, Levofloxacin, Lisinopril, Lorazepam, Mexiletine, and Sertraline    ROS:  Please see the history of present illness.   Otherwise, review of  systems are positive for none.   All other systems are reviewed and negative.    PHYSICAL EXAM: VS:  BP (!) 102/54    Pulse 71    Ht 6\' 1"  (1.854 m)    Wt 213 lb 12.8 oz (97 kg)    SpO2 94%    BMI 28.21 kg/m  , BMI Body mass index is 28.21 kg/m. GEN:  No distress NECK:  No jugular venous distention at 90 degrees, waveform within normal limits, carotid upstroke brisk and symmetric, no bruits, no thyromegaly LYMPHATICS:  No cervical adenopathy LUNGS:  Clear to auscultation bilaterally BACK:  No CVA tenderness CHEST:  Unremarkable HEART:  S1 and S2 within normal limits, no S3, no S4, no clicks, no rubs, no murmurs, distant heart sounds ABD:  Positive bowel sounds normal in frequency in pitch, no bruits, no rebound, no guarding, unable to assess midline mass or bruit with the patient seated. EXT:  2 plus pulses throughout, right lower greater than left leg edema, no cyanosis no clubbing SKIN:  No rashes no nodules NEURO:  Cranial nerves II through XII grossly intact, motor grossly intact throughout PSYCH:  Cognitively intact, oriented to person place and time   Recent Labs: 12/09/2020: TSH 0.833 12/11/2020: ALT 54; B Natriuretic Peptide 129.8; Magnesium 2.2 01/09/2021: BUN 19; Creatinine, Ser 1.56; Hemoglobin 12.7; Platelets 259; Potassium 4.1; Sodium 136     Lab Results  Component Value Date   TSH 0.833 12/09/2020   ALT 54 (H) 12/11/2020   AST 33 12/11/2020   ALKPHOS 43 12/11/2020   BILITOT 0.5 12/11/2020   PROT 5.1 (L) 12/11/2020   ALBUMIN 2.5 (L) 12/11/2020   EKG: Sinus rhythm, rate 71, axis within normal limits, intervals within normal limits, no acute ST-T wave changes.  Wt Readings from Last 3 Encounters:  02/14/21 213 lb 12.8 oz (97 kg)  01/20/21 195 lb (88.5 kg)  12/19/20 200  lb (90.7 kg)      Other studies Reviewed: Additional studies/ records that were reviewed today include: Hospital records Review of the above records demonstrates:   See elsewhere   ASSESSMENT  AND PLAN:  Cardiomyopathy - The patient's ejection fraction is 30 - 35% at the last echo.  He has not tolerated further med titration.  No change in therapy.  He does have some mild leg edema and will continue with as needed dosing of his diuretic as he is doing.  Atrial fibrillation - Mr. TAMARICK KOVALCIK has a CHA2DS2 - VASc score of 5.   He tolerates anticoagulation.  No change in therapy.  Of note his creatinine is mildly elevated at 1.56 but it is typically been 1.2 and so I will not change therapy.  VT - This is managed as above.  He has had no further symptoms.  I will defer further management to Dr. [name] at all.   HYPERTENSION -  This is being managed in the context of treating his CHF he does not tolerate med titration.  No change in therapy.  CAD - He is having no active chest pain.  No change in therapy.   CKD III Creatinine is 1.56.  We will continue the meds as listed.   AAA This was 3.2 cm in 2020 and he needs ultrasound in October.   We will arrange follow-up in October.    Current medicines are reviewed at length with the patient today.  The patient does not have concerns regarding medicines.  The following changes have been made:   None  Labs/ tests ordered today include:  None  Orders Placed This Encounter  Procedures   EKG 12-Lead     Disposition:   FU with me in 6 months.    Signed, Minus Breeding, MD  02/14/2021 3:28 PM    [facility]

## 2021-02-14 ENCOUNTER — Encounter: Payer: Self-pay | Admitting: Cardiology

## 2021-02-14 ENCOUNTER — Other Ambulatory Visit: Payer: Self-pay

## 2021-02-14 ENCOUNTER — Ambulatory Visit (INDEPENDENT_AMBULATORY_CARE_PROVIDER_SITE_OTHER): Payer: PPO | Admitting: Cardiology

## 2021-02-14 VITALS — BP 102/54 | HR 71 | Ht 73.0 in | Wt 213.8 lb

## 2021-02-14 DIAGNOSIS — I714 Abdominal aortic aneurysm, without rupture, unspecified: Secondary | ICD-10-CM

## 2021-02-14 DIAGNOSIS — I4891 Unspecified atrial fibrillation: Secondary | ICD-10-CM

## 2021-02-14 DIAGNOSIS — I2510 Atherosclerotic heart disease of native coronary artery without angina pectoris: Secondary | ICD-10-CM | POA: Diagnosis not present

## 2021-02-14 DIAGNOSIS — I5022 Chronic systolic (congestive) heart failure: Secondary | ICD-10-CM | POA: Diagnosis not present

## 2021-02-14 DIAGNOSIS — I472 Ventricular tachycardia, unspecified: Secondary | ICD-10-CM

## 2021-02-14 DIAGNOSIS — N1831 Chronic kidney disease, stage 3a: Secondary | ICD-10-CM | POA: Diagnosis not present

## 2021-02-14 NOTE — Patient Instructions (Signed)
Medication Instructions:  Your physician recommends that you continue on your current medications as directed. Please refer to the Current Medication list given to you today.  *If you need a refill on your cardiac medications before your next appointment, please call your pharmacy*  Lab Work: NONE ordered at this time of appointment   If you have labs (blood work) drawn today and your tests are completely normal, you will receive your results only by: MyChart Message (if you have MyChart) OR A paper copy in the mail If you have any lab test that is abnormal or we need to change your treatment, we will call you to review the results.  Testing/Procedures: NONE ordered at this time of appointment   Follow-Up: At [facility], you and your health needs are our priority.  As part of our continuing mission to provide you with exceptional heart care, we have created designated Provider Care Teams.  These Care Teams include your primary Cardiologist (physician) and Advanced Practice Providers (APPs -  Physician Assistants and Nurse Practitioners) who all work together to provide you with the care you need, when you need it.  Your next appointment:   12 month(s)  The format for your next appointment:   In Person  Provider:   James Hochrein, MD    Other Instructions   

## 2021-02-24 DIAGNOSIS — I429 Cardiomyopathy, unspecified: Secondary | ICD-10-CM | POA: Diagnosis not present

## 2021-02-24 DIAGNOSIS — I4891 Unspecified atrial fibrillation: Secondary | ICD-10-CM | POA: Diagnosis not present

## 2021-02-24 DIAGNOSIS — Z125 Encounter for screening for malignant neoplasm of prostate: Secondary | ICD-10-CM | POA: Diagnosis not present

## 2021-02-24 DIAGNOSIS — I5022 Chronic systolic (congestive) heart failure: Secondary | ICD-10-CM | POA: Diagnosis not present

## 2021-02-24 DIAGNOSIS — I2510 Atherosclerotic heart disease of native coronary artery without angina pectoris: Secondary | ICD-10-CM | POA: Diagnosis not present

## 2021-02-24 DIAGNOSIS — J449 Chronic obstructive pulmonary disease, unspecified: Secondary | ICD-10-CM | POA: Diagnosis not present

## 2021-02-24 DIAGNOSIS — E039 Hypothyroidism, unspecified: Secondary | ICD-10-CM | POA: Diagnosis not present

## 2021-02-24 DIAGNOSIS — Z13220 Encounter for screening for lipoid disorders: Secondary | ICD-10-CM | POA: Diagnosis not present

## 2021-04-02 ENCOUNTER — Encounter: Payer: PPO | Admitting: Internal Medicine

## 2021-04-10 ENCOUNTER — Encounter (HOSPITAL_COMMUNITY): Payer: Self-pay | Admitting: Pharmacy Technician

## 2021-04-10 ENCOUNTER — Emergency Department (HOSPITAL_COMMUNITY): Payer: No Typology Code available for payment source

## 2021-04-10 ENCOUNTER — Other Ambulatory Visit: Payer: Self-pay

## 2021-04-10 ENCOUNTER — Inpatient Hospital Stay (HOSPITAL_COMMUNITY)
Admission: EM | Admit: 2021-04-10 | Discharge: 2021-04-13 | DRG: 190 | Disposition: A | Discharge status: Home / Self Care | Payer: No Typology Code available for payment source | Attending: Internal Medicine | Admitting: Internal Medicine

## 2021-04-10 DIAGNOSIS — Z20822 Contact with and (suspected) exposure to covid-19: Secondary | ICD-10-CM | POA: Diagnosis present

## 2021-04-10 DIAGNOSIS — I252 Old myocardial infarction: Secondary | ICD-10-CM | POA: Diagnosis not present

## 2021-04-10 DIAGNOSIS — Z7951 Long term (current) use of inhaled steroids: Secondary | ICD-10-CM | POA: Diagnosis not present

## 2021-04-10 DIAGNOSIS — E785 Hyperlipidemia, unspecified: Secondary | ICD-10-CM | POA: Diagnosis present

## 2021-04-10 DIAGNOSIS — I480 Paroxysmal atrial fibrillation: Secondary | ICD-10-CM | POA: Diagnosis present

## 2021-04-10 DIAGNOSIS — Z8249 Family history of ischemic heart disease and other diseases of the circulatory system: Secondary | ICD-10-CM

## 2021-04-10 DIAGNOSIS — J449 Chronic obstructive pulmonary disease, unspecified: Secondary | ICD-10-CM | POA: Diagnosis present

## 2021-04-10 DIAGNOSIS — F32A Depression, unspecified: Secondary | ICD-10-CM | POA: Diagnosis present

## 2021-04-10 DIAGNOSIS — G4700 Insomnia, unspecified: Secondary | ICD-10-CM | POA: Diagnosis present

## 2021-04-10 DIAGNOSIS — Z825 Family history of asthma and other chronic lower respiratory diseases: Secondary | ICD-10-CM

## 2021-04-10 DIAGNOSIS — Z9981 Dependence on supplemental oxygen: Secondary | ICD-10-CM | POA: Diagnosis not present

## 2021-04-10 DIAGNOSIS — J439 Emphysema, unspecified: Secondary | ICD-10-CM | POA: Diagnosis present

## 2021-04-10 DIAGNOSIS — Z7901 Long term (current) use of anticoagulants: Secondary | ICD-10-CM

## 2021-04-10 DIAGNOSIS — I340 Nonrheumatic mitral (valve) insufficiency: Secondary | ICD-10-CM | POA: Diagnosis not present

## 2021-04-10 DIAGNOSIS — E039 Hypothyroidism, unspecified: Secondary | ICD-10-CM | POA: Diagnosis present

## 2021-04-10 DIAGNOSIS — F419 Anxiety disorder, unspecified: Secondary | ICD-10-CM | POA: Diagnosis present

## 2021-04-10 DIAGNOSIS — I361 Nonrheumatic tricuspid (valve) insufficiency: Secondary | ICD-10-CM | POA: Diagnosis not present

## 2021-04-10 DIAGNOSIS — Z888 Allergy status to other drugs, medicaments and biological substances status: Secondary | ICD-10-CM | POA: Diagnosis not present

## 2021-04-10 DIAGNOSIS — Z91040 Latex allergy status: Secondary | ICD-10-CM | POA: Diagnosis not present

## 2021-04-10 DIAGNOSIS — Z8616 Personal history of COVID-19: Secondary | ICD-10-CM | POA: Diagnosis not present

## 2021-04-10 DIAGNOSIS — Z79899 Other long term (current) drug therapy: Secondary | ICD-10-CM | POA: Diagnosis not present

## 2021-04-10 DIAGNOSIS — Z885 Allergy status to narcotic agent status: Secondary | ICD-10-CM

## 2021-04-10 DIAGNOSIS — N400 Enlarged prostate without lower urinary tract symptoms: Secondary | ICD-10-CM | POA: Diagnosis present

## 2021-04-10 DIAGNOSIS — Z95810 Presence of automatic (implantable) cardiac defibrillator: Secondary | ICD-10-CM

## 2021-04-10 DIAGNOSIS — I130 Hypertensive heart and chronic kidney disease with heart failure and stage 1 through stage 4 chronic kidney disease, or unspecified chronic kidney disease: Secondary | ICD-10-CM | POA: Diagnosis present

## 2021-04-10 DIAGNOSIS — J441 Chronic obstructive pulmonary disease with (acute) exacerbation: Secondary | ICD-10-CM | POA: Diagnosis not present

## 2021-04-10 DIAGNOSIS — R079 Chest pain, unspecified: Secondary | ICD-10-CM | POA: Diagnosis not present

## 2021-04-10 DIAGNOSIS — Z87891 Personal history of nicotine dependence: Secondary | ICD-10-CM | POA: Diagnosis not present

## 2021-04-10 DIAGNOSIS — I5022 Chronic systolic (congestive) heart failure: Secondary | ICD-10-CM | POA: Diagnosis present

## 2021-04-10 DIAGNOSIS — Z800 Family history of malignant neoplasm of digestive organs: Secondary | ICD-10-CM

## 2021-04-10 DIAGNOSIS — J90 Pleural effusion, not elsewhere classified: Secondary | ICD-10-CM | POA: Diagnosis not present

## 2021-04-10 DIAGNOSIS — N182 Chronic kidney disease, stage 2 (mild): Secondary | ICD-10-CM | POA: Diagnosis present

## 2021-04-10 DIAGNOSIS — R0602 Shortness of breath: Secondary | ICD-10-CM | POA: Diagnosis not present

## 2021-04-10 DIAGNOSIS — J9621 Acute and chronic respiratory failure with hypoxia: Secondary | ICD-10-CM | POA: Diagnosis present

## 2021-04-10 DIAGNOSIS — I351 Nonrheumatic aortic (valve) insufficiency: Secondary | ICD-10-CM | POA: Diagnosis not present

## 2021-04-10 LAB — BASIC METABOLIC PANEL
Anion gap: 5 (ref 5–15)
BUN: 24 mg/dL — ABNORMAL HIGH (ref 8–23)
CO2: 28 mmol/L (ref 22–32)
Calcium: 8.1 mg/dL — ABNORMAL LOW (ref 8.9–10.3)
Chloride: 98 mmol/L (ref 98–111)
Creatinine, Ser: 1.56 mg/dL — ABNORMAL HIGH (ref 0.61–1.24)
GFR, Estimated: 44 mL/min — ABNORMAL LOW (ref >60)
Glucose, Bld: 119 mg/dL — ABNORMAL HIGH (ref 70–99)
Potassium: 4.1 mmol/L (ref 3.5–5.1)
Sodium: 131 mmol/L — ABNORMAL LOW (ref 135–145)

## 2021-04-10 LAB — URINALYSIS, ROUTINE W REFLEX MICROSCOPIC
Bilirubin Urine: NEGATIVE
Glucose, UA: NEGATIVE mg/dL
Hgb urine dipstick: NEGATIVE
Ketones, ur: NEGATIVE mg/dL
Leukocytes,Ua: NEGATIVE
Nitrite: NEGATIVE
Protein, ur: NEGATIVE mg/dL
Specific Gravity, Urine: 1.008 (ref 1.005–1.030)
pH: 6 (ref 5.0–8.0)

## 2021-04-10 LAB — CBC
HCT: 37.7 % — ABNORMAL LOW (ref 39.0–52.0)
Hemoglobin: 11.9 g/dL — ABNORMAL LOW (ref 13.0–17.0)
MCH: 30.9 pg (ref 26.0–34.0)
MCHC: 31.6 g/dL (ref 30.0–36.0)
MCV: 97.9 fL (ref 80.0–100.0)
Platelets: 187 10*3/uL (ref 150–400)
RBC: 3.85 MIL/uL — ABNORMAL LOW (ref 4.22–5.81)
RDW: 13.9 % (ref 11.5–15.5)
WBC: 10.9 10*3/uL — ABNORMAL HIGH (ref 4.0–10.5)
nRBC: 0 % (ref 0.0–0.2)

## 2021-04-10 LAB — HEPATIC FUNCTION PANEL
ALT: 28 U/L (ref 0–44)
AST: 21 U/L (ref 15–41)
Albumin: 2.6 g/dL — ABNORMAL LOW (ref 3.5–5.0)
Alkaline Phosphatase: 57 U/L (ref 38–126)
Bilirubin, Direct: 0.2 mg/dL (ref 0.0–0.2)
Indirect Bilirubin: 0.6 mg/dL (ref 0.3–0.9)
Total Bilirubin: 0.8 mg/dL (ref 0.3–1.2)
Total Protein: 6.3 g/dL — ABNORMAL LOW (ref 6.5–8.1)

## 2021-04-10 LAB — RESP PANEL BY RT-PCR (FLU A&B, COVID) ARPGX2
Influenza A by PCR: NEGATIVE
Influenza B by PCR: NEGATIVE
SARS Coronavirus 2 by RT PCR: NEGATIVE

## 2021-04-10 LAB — CBG MONITORING, ED: Glucose-Capillary: 135 mg/dL — ABNORMAL HIGH (ref 70–99)

## 2021-04-10 MED ORDER — GUAIFENESIN ER 600 MG PO TB12
1200.0000 mg | ORAL_TABLET | Freq: Two times a day (BID) | ORAL | Status: DC
  Administered 2021-04-10 – 2021-04-13 (×7): 1200 mg via ORAL
  Filled 2021-04-10 (×8): qty 2

## 2021-04-10 MED ORDER — SODIUM CHLORIDE 0.9 % IV BOLUS
250.0000 mL | Freq: Once | INTRAVENOUS | Status: AC
  Administered 2021-04-10: 250 mL via INTRAVENOUS

## 2021-04-10 MED ORDER — LEVOTHYROXINE SODIUM 25 MCG PO TABS
125.0000 ug | ORAL_TABLET | Freq: Every day | ORAL | Status: DC
  Administered 2021-04-11 – 2021-04-13 (×3): 125 ug via ORAL
  Filled 2021-04-10 (×3): qty 1

## 2021-04-10 MED ORDER — TRAZODONE HCL 50 MG PO TABS
50.0000 mg | ORAL_TABLET | Freq: Every evening | ORAL | Status: DC | PRN
  Administered 2021-04-11: 50 mg via ORAL
  Filled 2021-04-10: qty 1

## 2021-04-10 MED ORDER — IPRATROPIUM BROMIDE HFA 17 MCG/ACT IN AERS
2.0000 | INHALATION_SPRAY | Freq: Once | RESPIRATORY_TRACT | Status: AC
  Administered 2021-04-10: 2 via RESPIRATORY_TRACT
  Filled 2021-04-10: qty 12.9

## 2021-04-10 MED ORDER — METHYLPREDNISOLONE SODIUM SUCC 40 MG IJ SOLR
40.0000 mg | Freq: Four times a day (QID) | INTRAMUSCULAR | Status: AC
  Administered 2021-04-10 – 2021-04-11 (×4): 40 mg via INTRAVENOUS
  Filled 2021-04-10 (×4): qty 1

## 2021-04-10 MED ORDER — VITAMIN D 25 MCG (1000 UNIT) PO TABS
1000.0000 [IU] | ORAL_TABLET | Freq: Every day | ORAL | Status: DC
  Administered 2021-04-11 – 2021-04-13 (×3): 1000 [IU] via ORAL
  Filled 2021-04-10 (×3): qty 1

## 2021-04-10 MED ORDER — TORSEMIDE 20 MG PO TABS
20.0000 mg | ORAL_TABLET | ORAL | Status: DC
  Administered 2021-04-11: 20 mg via ORAL
  Filled 2021-04-10: qty 1

## 2021-04-10 MED ORDER — PREDNISONE 20 MG PO TABS: 40.0000 mg | ORAL_TABLET | Freq: Every day | ORAL | Status: DC

## 2021-04-10 MED ORDER — METHYLPREDNISOLONE SODIUM SUCC 125 MG IJ SOLR
125.0000 mg | Freq: Once | INTRAMUSCULAR | Status: AC
  Administered 2021-04-10: 125 mg via INTRAVENOUS
  Filled 2021-04-10: qty 2

## 2021-04-10 MED ORDER — METOPROLOL SUCCINATE ER 25 MG PO TB24
25.0000 mg | ORAL_TABLET | Freq: Every day | ORAL | Status: DC
  Administered 2021-04-10 – 2021-04-12 (×3): 25 mg via ORAL
  Filled 2021-04-10 (×3): qty 1

## 2021-04-10 MED ORDER — UMECLIDINIUM BROMIDE 62.5 MCG/INH IN AEPB
1.0000 | INHALATION_SPRAY | Freq: Every day | RESPIRATORY_TRACT | Status: DC
  Administered 2021-04-11 – 2021-04-13 (×3): 1 via RESPIRATORY_TRACT
  Filled 2021-04-10: qty 7

## 2021-04-10 MED ORDER — RANOLAZINE ER 500 MG PO TB12
1000.0000 mg | ORAL_TABLET | Freq: Two times a day (BID) | ORAL | Status: DC
  Administered 2021-04-10 – 2021-04-13 (×6): 1000 mg via ORAL
  Filled 2021-04-10 (×7): qty 2

## 2021-04-10 MED ORDER — ACETAMINOPHEN 500 MG PO TABS: 500.0000 mg | ORAL_TABLET | Freq: Four times a day (QID) | ORAL | Status: DC | PRN

## 2021-04-10 MED ORDER — POLYVINYL ALCOHOL 1.4 % OP SOLN
1.0000 [drp] | Freq: Every day | OPHTHALMIC | Status: DC | PRN
  Filled 2021-04-10: qty 15

## 2021-04-10 MED ORDER — TAMSULOSIN HCL 0.4 MG PO CAPS
0.4000 mg | ORAL_CAPSULE | Freq: Every day | ORAL | Status: DC
  Administered 2021-04-10 – 2021-04-12 (×3): 0.4 mg via ORAL
  Filled 2021-04-10 (×3): qty 1

## 2021-04-10 MED ORDER — ALBUTEROL SULFATE (2.5 MG/3ML) 0.083% IN NEBU
2.5000 mg | INHALATION_SOLUTION | Freq: Four times a day (QID) | RESPIRATORY_TRACT | Status: DC
  Administered 2021-04-10 – 2021-04-12 (×7): 2.5 mg via RESPIRATORY_TRACT
  Filled 2021-04-10 (×6): qty 3

## 2021-04-10 MED ORDER — MELATONIN 3 MG PO TABS
3.0000 mg | ORAL_TABLET | Freq: Every evening | ORAL | Status: DC | PRN
  Administered 2021-04-10: 3 mg via ORAL
  Filled 2021-04-10 (×2): qty 1

## 2021-04-10 MED ORDER — PROMETHAZINE HCL 25 MG PO TABS: 25.0000 mg | ORAL_TABLET | Freq: Every day | ORAL | Status: DC | PRN

## 2021-04-10 MED ORDER — ALBUTEROL SULFATE HFA 108 (90 BASE) MCG/ACT IN AERS
8.0000 | INHALATION_SPRAY | Freq: Once | RESPIRATORY_TRACT | Status: AC
  Administered 2021-04-10: 8 via RESPIRATORY_TRACT
  Filled 2021-04-10: qty 6.7

## 2021-04-10 MED ORDER — PREDNISONE 20 MG PO TABS
60.0000 mg | ORAL_TABLET | Freq: Once | ORAL | Status: AC
  Administered 2021-04-10: 60 mg via ORAL
  Filled 2021-04-10: qty 3

## 2021-04-10 MED ORDER — SALINE SPRAY 0.65 % NA SOLN
1.0000 | Freq: Every day | NASAL | Status: DC | PRN
  Filled 2021-04-10: qty 44

## 2021-04-10 MED ORDER — FLUTICASONE FUROATE-VILANTEROL 200-25 MCG/INH IN AEPB
1.0000 | INHALATION_SPRAY | Freq: Every day | RESPIRATORY_TRACT | Status: DC
  Administered 2021-04-11 – 2021-04-13 (×3): 1 via RESPIRATORY_TRACT
  Filled 2021-04-10: qty 28

## 2021-04-10 MED ORDER — SODIUM CHLORIDE 0.9 % IV SOLN
500.0000 mg | Freq: Once | INTRAVENOUS | Status: AC
  Administered 2021-04-10: 500 mg via INTRAVENOUS
  Filled 2021-04-10: qty 500

## 2021-04-10 MED ORDER — AMIODARONE HCL 200 MG PO TABS
200.0000 mg | ORAL_TABLET | Freq: Every day | ORAL | Status: DC
  Administered 2021-04-11 – 2021-04-13 (×3): 200 mg via ORAL
  Filled 2021-04-10 (×3): qty 1

## 2021-04-10 MED ORDER — FINASTERIDE 5 MG PO TABS
5.0000 mg | ORAL_TABLET | Freq: Every day | ORAL | Status: DC
  Administered 2021-04-11 – 2021-04-13 (×3): 5 mg via ORAL
  Filled 2021-04-10 (×3): qty 1

## 2021-04-10 MED ORDER — PSYLLIUM 95 % PO PACK
1.0000 | PACK | Freq: Every day | ORAL | Status: DC
  Administered 2021-04-11 – 2021-04-13 (×3): 1 via ORAL
  Filled 2021-04-10 (×3): qty 1

## 2021-04-10 MED ORDER — DOXYCYCLINE HYCLATE 100 MG PO TABS
100.0000 mg | ORAL_TABLET | Freq: Two times a day (BID) | ORAL | Status: DC
  Administered 2021-04-10 – 2021-04-13 (×8): 100 mg via ORAL
  Filled 2021-04-10 (×8): qty 1

## 2021-04-10 MED ORDER — APIXABAN 5 MG PO TABS
5.0000 mg | ORAL_TABLET | Freq: Two times a day (BID) | ORAL | Status: DC
  Administered 2021-04-10 – 2021-04-12 (×4): 5 mg via ORAL
  Filled 2021-04-10 (×4): qty 1

## 2021-04-10 MED ORDER — TORSEMIDE 20 MG PO TABS: 20.0000 mg | ORAL_TABLET | ORAL | Status: DC

## 2021-04-10 MED ORDER — POTASSIUM CHLORIDE CRYS ER 10 MEQ PO TBCR
10.0000 meq | EXTENDED_RELEASE_TABLET | Freq: Every day | ORAL | Status: DC
  Administered 2021-04-10 – 2021-04-12 (×3): 10 meq via ORAL
  Filled 2021-04-10 (×3): qty 1

## 2021-04-10 MED ORDER — ENOXAPARIN SODIUM 40 MG/0.4ML IJ SOSY: 40.0000 mg | PREFILLED_SYRINGE | INTRAMUSCULAR | Status: DC

## 2021-04-10 MED ORDER — ALBUTEROL SULFATE (2.5 MG/3ML) 0.083% IN NEBU: 2.5000 mg | INHALATION_SOLUTION | Freq: Four times a day (QID) | RESPIRATORY_TRACT | Status: DC | PRN

## 2021-04-10 MED ORDER — ADULT MULTIVITAMIN W/MINERALS CH
1.0000 | ORAL_TABLET | Freq: Every day | ORAL | Status: DC
  Administered 2021-04-11 – 2021-04-13 (×3): 1 via ORAL
  Filled 2021-04-10 (×3): qty 1

## 2021-04-10 MED ORDER — TORSEMIDE 20 MG PO TABS: 30.0000 mg | ORAL_TABLET | ORAL | Status: DC

## 2021-04-10 MED ORDER — SODIUM CHLORIDE 0.9 % IV SOLN
1.0000 g | Freq: Once | INTRAVENOUS | Status: AC
  Administered 2021-04-10: 1 g via INTRAVENOUS
  Filled 2021-04-10: qty 10

## 2021-04-10 NOTE — ED Provider Notes (Signed)
[facility] EMERGENCY DEPARTMENT Provider Note   CSN: [id number] Arrival date & time: 04/10/21  0856     History Chief Complaint  Patient presents with  . Weakness    Corey Tyler is a 80 y.o. male.  80 yo M with a cc of fatigue.  Going on for about a week now.  Having mild increase of cough.  Started after he got his booster shot for the coronavirus.  Get this done at the [place].  Has been feeling much more weak than typical.  Mild worsening trouble breathing but not significant.  He is typically on 3-1/2 L oxygen at rest and 4 on exertion.  Found by EMS to be at 77% on his 4 L at home.  Bumped up to 5 with improvement.  Denies any chest pain or pressure.  Denies abdominal pain nausea vomiting or diarrhea.  The history is provided by the patient.  Illness Severity:  Moderate Onset quality:  Gradual Duration:  1 week Timing:  Constant Progression:  Worsening Chronicity:  New Associated symptoms: cough, fatigue and shortness of breath   Associated symptoms: no abdominal pain, no chest pain, no congestion, no diarrhea, no fever, no headaches, no myalgias, no rash and no vomiting        Past Medical History:  Diagnosis Date  . A-fib ([place])    a. Noted on 12/2012 interrogation, placed on Apixaban and ultimately stopped NOACs 2/2 personal decision 8/14.  Marland Kitchen AICD (automatic cardioverter/defibrillator) present    Cardiac defibrillator -dual  [employer]  . Arthritis   . Atrial tachycardia-non sustained    a. Noted on 03/2012 interrogation.  . Chronic systolic heart failure (HCC)    EF down to 20 to 25% per echo November 2012  . COPD (chronic obstructive pulmonary disease) ([place])   . Hyperlipidemia   . Hypertension   . ICD (implantable cardiac defibrillator) in place   . NICM (nonischemic cardiomyopathy) ([place])    a. Normal cors 2000. b. Minimal plaque 2013.  . Old myocardial infarction    a. ?Silent MI. b. Large fixed inferolateral defect c/w with prior infarct, no  ischemia. c. Cath in 2000/2013 with only minimal CAD.  Marland Kitchen Presence of permanent cardiac pacemaker   . Pulmonary nodule, right    Last scan in 2010 showing stability; felt to be benign.  Marland Kitchen PVC's (premature ventricular contractions)   . Ventricular tachycardia, polymorphic ([place])    Rx via ICD 12/14    Patient Active Problem List   Diagnosis Date Noted  . Coronary artery disease involving native coronary artery of native heart without angina pectoris 02/13/2021  . AAA (abdominal aortic aneurysm) without rupture ([place]) 02/13/2021  . Syncope 12/09/2020  . Educated about COVID-19 virus infection 05/13/2020  . Orthostatic hypotension 12/29/2019  . NSVT (nonsustained ventricular tachycardia) ([place]) 12/29/2019  . Tremor 10/18/2019  . Hypomagnesemia 09/07/2019  . Hypokalemia 09/07/2019  . Stage 3a chronic kidney disease ([place]) 09/02/2019  . SBO (small bowel obstruction) ([place]) 08/31/2019  . Chronic respiratory failure with hypoxia ([place]) 08/31/2019  . Chronic anticoagulation 08/31/2019  . Hypothyroidism 08/31/2019  . Chronic nausea 08/31/2019  . Generalized weakness 07/29/2019  . Acute respiratory failure ([place]) 09/15/2017  . COPD exacerbation ([place]) 09/15/2017  . Sustained ventricular tachycardia ([place]) 06/10/2017  . VT (ventricular tachycardia) ([place]) 11/02/2016  . Benign fibroma of prostate 04/02/2015  . Ventricular tachycardia, polymorphic ([place])   . Diverticulosis of colon without hemorrhage 09/25/2013  . Atrial fibrillation ([place]) 12/29/2012  .  ICD (implantable cardioverter-defibrillator) in place   . Hyperlipidemia   . PVCs 11/26/2011  . Chronic systolic heart failure ([place]) 10/07/2011  . Cardiomyopathy, nonischemic and ischemic 09/24/2011  . 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 PULMONARY NODULE 12/04/2008    Past Surgical History:  Procedure Laterality Date  . APPENDECTOMY    . CARDIAC CATHETERIZATION  2000  . CARDIAC DEFIBRILLATOR  PLACEMENT    . CARDIOVERSION  03/30/2013   Procedure: CARDIOVERSION;  Surgeon: Thayer Headings, MD;  Location: [place];  Service: Cardiovascular;;  . COLON SURGERY    . COLONOSCOPY N/A 09/25/2013   Procedure: COLONOSCOPY;  Surgeon: Jerene Bears, MD;  Location: [facility];  Service: Endoscopy;  Laterality: N/A;  . ICD  12/2011   [name]  . ICD GENERATOR CHANGEOUT N/A 01/20/2021   Procedure: ICD GENERATOR CHANGEOUT;  Surgeon: Deboraha Sprang, MD;  Location: [place] CV LAB;  Service: Cardiovascular;  Laterality: N/A;  . IMPLANTABLE CARDIOVERTER DEFIBRILLATOR IMPLANT N/A 01/06/2012   Procedure: IMPLANTABLE CARDIOVERTER DEFIBRILLATOR IMPLANT;  Surgeon: Deboraha Sprang, MD;  [facility]: [facility] CATH LAB;  Service: Cardiovascular;  Laterality: N/A;  . PACEMAKER IMPLANT     [name]  . TEE WITHOUT CARDIOVERSION N/A 03/30/2013   Procedure: TRANSESOPHAGEAL ECHOCARDIOGRAM (TEE);  Surgeon: Thayer Headings, MD;  Location: [employer];  Service: Cardiovascular;  Laterality: N/A;  . V TACH ABLATION  06/10/2017  . V TACH ABLATION N/A 06/10/2017   Procedure: V Tach Ablation;  Surgeon: Evans Lance, MD;  Location: [place] CV LAB;  Service: Cardiovascular;  Laterality: N/A;       Family History  Problem Relation Age of Onset  . Emphysema Mother   . Heart disease Mother        AVR  . Heart failure Father        Died agwe 63  . Esophageal cancer Brother   . Colon cancer Neg Hx     Social History   Tobacco Use  . Smoking status: Former Smoker    Packs/day: 2.50    Years: 40.00    Pack years: 100.00    Types: Cigarettes    Quit date: 11/24/1983    Years since quitting: 37.4  . Smokeless tobacco: Never Used  . Tobacco comment: 2 1/2 ppd x 40 years  Vaping Use  . Vaping Use: Never used  Substance Use Topics  . Alcohol use: No  . Drug use: No    Home Medications Prior to Admission medications   Medication Sig Start Date End Date Taking? Authorizing Provider  acetaminophen (TYLENOL) 500 MG  tablet Take 500 mg by mouth every 6 (six) hours as needed for headache (pain).    [provider]  albuterol (PROVENTIL) (2.5 MG/3ML) 0.083% nebulizer solution Take 2.5 mg by nebulization every 6 (six) hours as needed for wheezing or shortness of breath.    [provider]  albuterol (VENTOLIN HFA) 108 (90 Base) MCG/ACT inhaler Inhale 2 puffs into the lungs every 6 (six) hours as needed for wheezing or shortness of breath.    [provider]  amiodarone (PACERONE) 200 MG tablet Take 1 tablet (200 mg total) by mouth 2 (two) times daily. Patient taking differently: Take 200 mg by mouth daily. 12/11/20   Thurnell Lose, MD  budesonide-formoterol (SYMBICORT) 160-4.5 MCG/ACT inhaler Inhale 2 puffs into the lungs 2 (two) times daily.    [provider]  Carboxymethylcellul-Glycerin (LUBRICATING EYE DROPS OP) Place 1 drop  into both eyes in the morning and at bedtime.    [provider]  cholecalciferol (VITAMIN D) 1000 UNITS tablet Take 1,000 Units by mouth daily.    [provider]  ELIQUIS 5 MG TABS tablet TAKE 1 TABLET BY MOUTH TWICE A DAY Patient taking differently: Take 5 mg by mouth 2 (two) times daily. 07/30/15   Minus Breeding, MD  finasteride (PROSCAR) 5 MG tablet Take 5 mg by mouth daily.     [provider]  levothyroxine (SYNTHROID, LEVOTHROID) 125 MCG tablet Take 125 mcg by mouth daily before breakfast.  06/15/17   [provider]  MAGNESIUM PO Take 420 mg by mouth at bedtime.    [provider]  Melatonin 3 MG TABS Take 3 mg by mouth at bedtime.    [provider]  methylPREDNISolone (MEDROL DOSEPAK) 4 MG TBPK tablet follow package directions 12/11/20   Thurnell Lose, MD  metoprolol succinate (TOPROL XL) 25 MG 24 hr tablet TAKE 1/2 TABLET BID Patient taking differently: Take 12.5 mg by mouth in the morning and at bedtime. 12/19/20   Baldwin Jamaica, PA-C  Multiple Vitamins-Minerals (MULTIVITAMIN WITH  MINERALS) tablet Take 1 tablet by mouth daily. Mens 50 plus    [provider]  nitroGLYCERIN (NITROSTAT) 0.4 MG SL tablet Place 0.4 mg under the tongue every 5 (five) minutes x 3 doses as needed for chest pain. 11/12/11   Burtis Junes, NP  Omega-3 Fatty Acids (FISH OIL) 1000 MG CPDR Take 1,000 mg by mouth daily.    [provider]  OXYGEN Inhale 4 L into the lungs See admin instructions.    [provider]  potassium chloride (MICRO-K) 10 MEQ CR capsule Take 10 mEq by mouth at bedtime.    [provider]  promethazine (PHENERGAN) 25 MG tablet Take 25 mg by mouth daily as needed for nausea or vomiting.  04/20/17   [provider]  Psyllium (METAMUCIL PO) Take 15 g by mouth daily. Mix in liquid and drink    [provider]  ranolazine (RANEXA) 1000 MG SR tablet Take 1 tablet (1,000 mg total) by mouth 2 (two) times daily. 02/07/21   Deboraha Sprang, MD  sodium chloride (OCEAN) 0.65 % SOLN nasal spray Place 1 spray into both nostrils daily as needed for congestion.    [provider]  tamsulosin (FLOMAX) 0.4 MG CAPS capsule Take 0.4 mg by mouth at bedtime.    [provider]  Tiotropium Bromide Monohydrate (SPIRIVA RESPIMAT) 2.5 MCG/ACT AERS Inhale 2 puffs into the lungs daily.    [provider]  torsemide (DEMADEX) 20 MG tablet Take 1 tablet (20 mg total) by mouth daily. Take 1 tablet by mouth daily and 1 1/2 tablet on  Wed Sat Sun and Patient taking differently: Take 20 mg by mouth See admin instructions. Take 1 tablet (20 mg) by mouth daily on Mondays, Tuesdays, Thursdays, & Fridays. Take 1.5 tablets (30 mg) by mouth daily on Wednesdays, Saturdays, & Sundays. 12/19/20   Baldwin Jamaica, PA-C  traZODone (DESYREL) 50 MG tablet Take 50 mg by mouth at bedtime as needed for sleep.    [provider]    Allergies    Atorvastatin, Statins, Xarelto [rivaroxaban], Adhesive [tape], Codeine, Isosorbide nitrate,  Latex, Levofloxacin, Lisinopril, Lorazepam, Mexiletine, and Sertraline  Review of Systems   Review of Systems  Constitutional: Positive for fatigue. Negative for chills and fever.  HENT: Negative for congestion and facial swelling.  Eyes: Negative for discharge and visual disturbance.  Respiratory: Positive for cough and shortness of breath.   Cardiovascular: Negative for chest pain and palpitations.  Gastrointestinal: Negative for abdominal pain, diarrhea and vomiting.  Musculoskeletal: Negative for arthralgias and myalgias.  Skin: Negative for color change and rash.  Neurological: Negative for tremors, syncope and headaches.  Psychiatric/Behavioral: Negative for confusion and dysphoric mood.    Physical Exam Updated Vital Signs BP 124/66   Pulse 64   Temp 98.7 F (37.1 C) (Oral)   Resp 16   Ht 6\' 1"  (1.854 m)   Wt 90.7 kg   SpO2 91%   BMI 26.39 kg/m   Physical Exam Vitals and nursing note reviewed.  Constitutional:      Appearance: He is well-developed.  HENT:     Head: Normocephalic and atraumatic.  Eyes:     Pupils: Pupils are equal, round, and reactive to light.  Neck:     Vascular: No JVD.  Cardiovascular:     Rate and Rhythm: Normal rate and regular rhythm.     Heart sounds: No murmur heard. No friction rub. No gallop.   Pulmonary:     Effort: No respiratory distress.     Breath sounds: Wheezing present.     Comments: Diffuse wheezes in all fields with prolonged expiratory effort. Abdominal:     General: There is no distension.     Tenderness: There is no abdominal tenderness. There is no guarding or rebound.  Musculoskeletal:        General: Normal range of motion.     Cervical back: Normal range of motion and neck supple.     Right lower leg: Edema present.     Left lower leg: Edema present.     Comments: 1+ edema to bilateral lower extremities  Skin:    Coloration: Skin is not pale.     Findings: No rash.  Neurological:     Mental Status: He is  alert and oriented to person, place, and time.  Psychiatric:        Behavior: Behavior normal.     ED Results / Procedures / Treatments   Labs (all labs ordered are listed, but only abnormal results are displayed) Labs Reviewed  BASIC METABOLIC PANEL - Abnormal; Notable for the following components:      Result Value   Sodium 131 (*)    Glucose, Bld 119 (*)    BUN 24 (*)    Creatinine, Ser 1.56 (*)    Calcium 8.1 (*)    GFR, Estimated 44 (*)    All other components within normal limits  CBC - Abnormal; Notable for the following components:   WBC 10.9 (*)    RBC 3.85 (*)    Hemoglobin 11.9 (*)    HCT 37.7 (*)    All other components within normal limits  CBG MONITORING, ED - Abnormal; Notable for the following components:   Glucose-Capillary 135 (*)    All other components within normal limits  URINALYSIS, ROUTINE W REFLEX MICROSCOPIC    EKG EKG Interpretation  Date/Time:  Thursday Apr 10 2021 08:59:15 EDT Ventricular Rate:  75 PR Interval:  186 QRS Duration: 114 QT Interval:  402 QTC Calculation: 448 R Axis:   17 Text Interpretation: Normal sinus rhythm Cannot rule out Inferior infarct , age undetermined Abnormal ECG No significant change since last tracing Confirmed by Deno Etienne [phone]) on 04/10/2021 11:38:37 AM   Radiology DG Chest 2 View  Result Date: 04/10/2021 CLINICAL  DATA:  80 year old male with history of decreased oxygen saturations and shortness of breath. EXAM: CHEST - 2 VIEW COMPARISON:  Chest x-ray 01/09/2021. FINDINGS: Lung volumes are low. Widespread areas of interstitial prominence and patchy airspace disease noted throughout the lungs bilaterally, most evident throughout the mid to lower lungs. Probable trace bilateral pleural effusions. No pneumothorax. No evidence of pulmonary edema. Heart size is normal. Upper mediastinal contours are within normal limits. Left-sided pacemaker/AICD in place with lead tips projecting over the expected location of the  right atrium and right ventricle. IMPRESSION: 1. The appearance of the chest is concerning for multilobar bilateral pneumonia. Clinical correlation for signs and symptoms of viral infection is recommended. 2. Trace bilateral pleural effusions. 3. Aortic atherosclerosis. Electronically Signed   By: Vinnie Langton M.D.   On: 04/10/2021 10:01    Procedures Procedures   Medications Ordered in ED Medications  azithromycin (ZITHROMAX) 500 mg in sodium chloride 0.9 % 250 mL IVPB (500 mg Intravenous New Bag/Given 04/10/21 1305)  methylPREDNISolone sodium succinate (SOLU-MEDROL) 125 mg/2 mL injection 125 mg (has no administration in time range)  sodium chloride 0.9 % bolus 250 mL (0 mLs Intravenous Stopped 04/10/21 1303)  albuterol (VENTOLIN HFA) 108 (90 Base) MCG/ACT inhaler 8 puff (8 puffs Inhalation Given 04/10/21 1221)  ipratropium (ATROVENT HFA) inhaler 2 puff (2 puffs Inhalation Given 04/10/21 1222)  predniSONE (DELTASONE) tablet 60 mg (60 mg Oral Given 04/10/21 1220)  cefTRIAXone (ROCEPHIN) 1 g in sodium chloride 0.9 % 100 mL IVPB (0 g Intravenous Stopped 04/10/21 1303)    ED Course  I have reviewed the triage vital signs and the nursing notes.  Pertinent labs & imaging results that were available during my care of the patient were reviewed by me and considered in my medical decision making (see chart for details).    MDM Rules/Calculators/A&P                          80 yo M with a chief complaints of fatigue.  Found to be hypoxic on his home oxygen at home.  Patient lung exam most consistent with a COPD exacerbation here.  Chest x-ray concerning for multifocal pneumonia.  Had a recent positive COVID test within 90 days.  We will treat with antibiotics.  Breathing treatments.  Steroids reassess.  The patient is feeling mildly better after inhaled medications here.  Still requiring 5 L of oxygen which is above his baseline.  We will discuss with medicine for admission.  CRITICAL  CARE Performed by: Cecilio Asper   Total critical care time: 35 minutes  Critical care time was exclusive of separately billable procedures and treating other patients.  Critical care was necessary to treat or prevent imminent or life-threatening deterioration.  Critical care was time spent personally by me on the following activities: development of treatment plan with patient and/or surrogate as well as nursing, discussions with consultants, evaluation of patient's response to treatment, examination of patient, obtaining history from patient or surrogate, ordering and performing treatments and interventions, ordering and review of laboratory studies, ordering and review of radiographic studies, pulse oximetry and re-evaluation of patient's condition.   The patients results and plan were reviewed and discussed.   Any x-rays performed were independently reviewed by myself.   Differential diagnosis were considered with the presenting HPI.  Medications  azithromycin (ZITHROMAX) 500 mg in sodium chloride 0.9 % 250 mL IVPB (500 mg Intravenous New Bag/Given 04/10/21 1305)  methylPREDNISolone sodium  succinate (SOLU-MEDROL) 125 mg/2 mL injection 125 mg (has no administration in time range)  sodium chloride 0.9 % bolus 250 mL (0 mLs Intravenous Stopped 04/10/21 1303)  albuterol (VENTOLIN HFA) 108 (90 Base) MCG/ACT inhaler 8 puff (8 puffs Inhalation Given 04/10/21 1221)  ipratropium (ATROVENT HFA) inhaler 2 puff (2 puffs Inhalation Given 04/10/21 1222)  predniSONE (DELTASONE) tablet 60 mg (60 mg Oral Given 04/10/21 1220)  cefTRIAXone (ROCEPHIN) 1 g in sodium chloride 0.9 % 100 mL IVPB (0 g Intravenous Stopped 04/10/21 1303)    Vitals:   04/10/21 1145 04/10/21 1215 04/10/21 1223 04/10/21 1306  BP: 120/67 120/61  124/66  Pulse: 70 68  64  Resp: (!) 23 (!) 21  16  Temp:      TempSrc:      SpO2: 97% 96%  91%  Weight:   90.7 kg   Height:   6\' 1"  (1.854 m)     Final diagnoses:  COPD  exacerbation ([place])    Admission/ observation were discussed with the admitting physician, patient and/or family and they are comfortable with the plan.   Final Clinical Impression(s) / ED Diagnoses Final diagnoses:  COPD exacerbation [facility])    Rx / DC Orders ED Discharge Orders    None       Deno Etienne, DO 04/10/21 1403

## 2021-04-10 NOTE — ED Triage Notes (Signed)
Pt here with reports of increased weakness for the last week. Pt states he got his booster shot last week and has not felt well since. Pt also states his oxygen saturations have been running lower than normal. Wears 4L [place] while awake and 3L [place] at night. Pt states today his oxygen was 77% while wearing his oxgyen. Denies shob.

## 2021-04-10 NOTE — H&P (Signed)
History and Physical    Corey Tyler:[id number] DOB: 1941-05-07 DOA: 04/10/2021  PCP: Pcp, No (Confirm with patient/family/NH records and if not entered, this has to be entered at [facility] point of entry) Patient coming from: Home  I have personally briefly reviewed patient's old medical records in [place]  Chief Complaint: Wheezing, SOB  HPI: Corey Tyler is a 80 y.o. male with medical history significant of COPD Gold stage II, chronic O2 dependent hypoxic renal failure on 3.5 liters around-the-clock, PAF, chronic systolic CHF LVEF 30 to 81% status post AICD, CKD stage II, hypothyroidism, BPH, recent COVID infection in late February 2022, presented with increasing shortness of breath and wheezing.  Patient received a booster dose of COVID-vaccine last week, since then she developed new wheezing especially at night, and a dry cough.  And slowly she developed shortness of breath, he tried to turn up his oxygen for the last few days with minimum help.  Denies any fever chills, no chest pains.  No leg swelling.  Family called EMS today and EMS arrived found patient O2 saturation 77% on 4 L.  ED Course: Patient was given IV Solu-Medrol and breathing treatment in the ED, x-ray showed possible atypical pneumonia.  Review of Systems: As per HPI otherwise 14 point review of systems negative.    Past Medical History:  Diagnosis Date  . A-fib ([place])    a. Noted on 12/2012 interrogation, placed on Apixaban and ultimately stopped NOACs 2/2 personal decision 8/14.  Marland Kitchen AICD (automatic cardioverter/defibrillator) present    Cardiac defibrillator -dual  [employer] Judes  . Arthritis   . Atrial tachycardia-non sustained    a. Noted on 03/2012 interrogation.  . Chronic systolic heart failure (HCC)    EF down to 20 to 25% per echo November 2012  . COPD (chronic obstructive pulmonary disease) ([place])   . Hyperlipidemia   . Hypertension   . ICD (implantable cardiac defibrillator) in place   . NICM  (nonischemic cardiomyopathy) ([place])    a. Normal cors 2000. b. Minimal plaque 2013.  . Old myocardial infarction    a. ?Silent MI. b. Large fixed inferolateral defect c/w with prior infarct, no ischemia. c. Cath in 2000/2013 with only minimal CAD.  Marland Kitchen Presence of permanent cardiac pacemaker   . Pulmonary nodule, right    Last scan in 2010 showing stability; felt to be benign.  Marland Kitchen PVC's (premature ventricular contractions)   . Ventricular tachycardia, polymorphic ([place])    Rx via ICD 12/14    Past Surgical History:  Procedure Laterality Date  . APPENDECTOMY    . CARDIAC CATHETERIZATION  2000  . CARDIAC DEFIBRILLATOR PLACEMENT    . CARDIOVERSION  03/30/2013   Procedure: CARDIOVERSION;  Surgeon: Thayer Headings, MD;  Location: [place];  Service: Cardiovascular;;  . COLON SURGERY    . COLONOSCOPY N/A 09/25/2013   Procedure: COLONOSCOPY;  Surgeon: Jerene Bears, MD;  Location: [facility];  Service: Endoscopy;  Laterality: N/A;  . ICD  12/2011   [name]  . ICD GENERATOR CHANGEOUT N/A 01/20/2021   Procedure: ICD GENERATOR CHANGEOUT;  Surgeon: Deboraha Sprang, MD;  Location: [place] CV LAB;  Service: Cardiovascular;  Laterality: N/A;  . IMPLANTABLE CARDIOVERTER DEFIBRILLATOR IMPLANT N/A 01/06/2012   Procedure: IMPLANTABLE CARDIOVERTER DEFIBRILLATOR IMPLANT;  Surgeon: Deboraha Sprang, MD;  Location: [facility] CATH LAB;  Service: Cardiovascular;  Laterality: N/A;  . PACEMAKER IMPLANT     St [name]  . TEE WITHOUT CARDIOVERSION N/A 03/30/2013  Procedure: TRANSESOPHAGEAL ECHOCARDIOGRAM (TEE);  Surgeon: Thayer Headings, MD;  Location: [place];  Service: Cardiovascular;  Laterality: N/A;  . V TACH ABLATION  06/10/2017  . V TACH ABLATION N/A 06/10/2017   Procedure: V Tach Ablation;  Surgeon: Evans Lance, MD;  Location: [place] CV LAB;  Service: Cardiovascular;  Laterality: N/A;     reports that he quit smoking about 37 years ago. His smoking use included cigarettes. He has a 100.00 pack-year  smoking history. He has never used smokeless tobacco. He reports that he does not drink alcohol and does not use drugs.  Allergies  Allergen Reactions  . Atorvastatin Shortness Of Breath and Cough  . Statins Shortness Of Breath and Other (See Comments)    Muscle and joing pain and "These bother my lungs and make my gait unsteady, also"  . Xarelto [Rivaroxaban] Other (See Comments)    Bleeding  . Adhesive [Tape] Hives, Itching and Rash  . Codeine Nausea Only  . Isosorbide Nitrate Other (See Comments)    Made him feel bad  . Latex Itching and Other (See Comments)    Rash, itching, burning  . Levofloxacin Other (See Comments)    Tachycardia  . Lisinopril Cough  . Lorazepam Other (See Comments)    "felt bad"  . Mexiletine Other (See Comments)    GI distress  . Sertraline Other (See Comments)    Made him feel bad    Family History  Problem Relation Age of Onset  . Emphysema Mother   . Heart disease Mother        AVR  . Heart failure Father        Died agwe 87  . Esophageal cancer Brother   . Colon cancer Neg Hx      Prior to Admission medications   Medication Sig Start Date End Date Taking? Authorizing Provider  acetaminophen (TYLENOL) 500 MG tablet Take 500 mg by mouth every 6 (six) hours as needed for headache (pain).   Yes [provider]  albuterol (PROVENTIL) (2.5 MG/3ML) 0.083% nebulizer solution Take 2.5 mg by nebulization every 6 (six) hours as needed for wheezing or shortness of breath.   Yes [provider]  albuterol (VENTOLIN HFA) 108 (90 Base) MCG/ACT inhaler Inhale 2 puffs into the lungs every 6 (six) hours as needed for wheezing or shortness of breath.   Yes [provider]  amiodarone (PACERONE) 200 MG tablet Take 1 tablet (200 mg total) by mouth 2 (two) times daily. Patient taking differently: Take 200 mg by mouth daily. 12/11/20  Yes Thurnell Lose, MD  budesonide-formoterol [facility]) 160-4.5 MCG/ACT inhaler Inhale 2 puffs into  the lungs 2 (two) times daily.   Yes [provider]  Carboxymethylcellul-Glycerin (LUBRICATING EYE DROPS OP) Place 1 drop into both eyes daily as needed (dry eyes).   Yes [provider]  cholecalciferol (VITAMIN D) 1000 UNITS tablet Take 1,000 Units by mouth daily.   Yes [provider]  ELIQUIS 5 MG TABS tablet TAKE 1 TABLET BY MOUTH TWICE A DAY Patient taking differently: Take 5 mg by mouth 2 (two) times daily. 07/30/15  Yes Minus Breeding, MD  finasteride (PROSCAR) 5 MG tablet Take 5 mg by mouth daily.    Yes [provider]  levothyroxine (SYNTHROID, LEVOTHROID) 125 MCG tablet Take 125 mcg by mouth daily before breakfast.  06/15/17  Yes [provider]  MAGNESIUM PO Take 420 mg by mouth at bedtime.   Yes [provider]  Melatonin 3 MG TABS Take 3 mg by mouth at bedtime as needed (sleep).   Yes [provider]  metoprolol succinate (TOPROL XL) 25 MG 24 hr tablet TAKE 1/2 TABLET BID Patient taking differently: Take 25 mg by mouth at bedtime. 12/19/20  Yes Baldwin Jamaica, PA-C  Multiple Vitamins-Minerals (MULTIVITAMIN WITH MINERALS) tablet Take 1 tablet by mouth daily. Mens 50 plus   Yes [provider]  nitroGLYCERIN (NITROSTAT) 0.4 MG SL tablet Place 0.4 mg under the tongue every 5 (five) minutes x 3 doses as needed for chest pain. 11/12/11  Yes Burtis Junes, NP  Omega-3 Fatty Acids (FISH OIL) 1000 MG CPDR Take 1,000 mg by mouth daily.   Yes [provider]  OXYGEN Inhale 4 L into the lungs See admin instructions.   Yes [provider]  potassium chloride (MICRO-K) 10 MEQ CR capsule Take 10 mEq by mouth at bedtime.   Yes [provider]  promethazine (PHENERGAN) 25 MG tablet Take 25 mg by mouth daily as needed for nausea or vomiting.  04/20/17  Yes [provider]  Psyllium (METAMUCIL PO) Take 15 g by mouth daily. Mix in liquid and drink   Yes [provider]  ranolazine  (RANEXA) 1000 MG SR tablet Take 1 tablet (1,000 mg total) by mouth 2 (two) times daily. 02/07/21  Yes Deboraha Sprang, MD  sodium chloride (OCEAN) 0.65 % SOLN nasal spray Place 1 spray into both nostrils daily as needed for congestion.   Yes [provider]  tamsulosin (FLOMAX) 0.4 MG CAPS capsule Take 0.4 mg by mouth at bedtime.   Yes [provider]  Tiotropium Bromide Monohydrate (SPIRIVA RESPIMAT) 2.5 MCG/ACT AERS Inhale 2 puffs into the lungs daily as needed (sob).   Yes [provider]  torsemide (DEMADEX) 20 MG tablet Take 1 tablet (20 mg total) by mouth daily. Take 1 tablet by mouth daily and 1 1/2 tablet on  Wed Sat Sun and Patient taking differently: Take 20 mg by mouth See admin instructions. Take 1 tablet (20 mg) by mouth daily on Mondays, Tuesdays, Thursdays, & Fridays. Take 1.5 tablets (30 mg) by mouth daily on Wednesdays, Saturdays, & Sundays. 12/19/20  Yes Baldwin Jamaica, PA-C  traZODone (DESYREL) 50 MG tablet Take 50 mg by mouth at bedtime as needed for sleep.   Yes [provider]  methylPREDNISolone (MEDROL DOSEPAK) 4 MG TBPK tablet follow package directions Patient not taking: No sig reported 12/11/20   Thurnell Lose, MD    Physical Exam: Vitals:   04/10/21 1215 04/10/21 1223 04/10/21 1306 04/10/21 1400  BP: 120/61  124/66 133/69  Pulse: 68  64 65  Resp: (!) 21  16 20   Temp:      TempSrc:      SpO2: 96%  91% 93%  Weight:  90.7 kg    Height:  6\' 1"  (1.854 m)      Constitutional: NAD, calm, comfortable Vitals:   04/10/21 1215 04/10/21 1223 04/10/21 1306 04/10/21 1400  BP: 120/61  124/66 133/69  Pulse: 68  64 65  Resp: (!) 21  16 20   Temp:      TempSrc:      SpO2: 96%  91% 93%  Weight:  90.7 kg    Height:  6\' 1"  (1.854 m)     Eyes: PERRL, lids and conjunctivae normal ENMT: Mucous membranes are moist. Posterior pharynx clear of any exudate or lesions.Normal dentition.  Neck: normal, supple, no masses, no  thyromegaly Respiratory: Diminished breathing bilaterally, diffused wheezing, no crackles.  Increasing respiratory effort. No accessory muscle use.  Cardiovascular: Regular rate and rhythm, no murmurs / rubs / gallops. No extremity edema. 2+ pedal pulses. No carotid bruits.  Abdomen: no tenderness, no masses palpated. No hepatosplenomegaly. Bowel sounds positive.  Musculoskeletal: no clubbing / cyanosis. No joint deformity upper and lower extremities. Good ROM, no contractures. Normal muscle tone.  Skin: no rashes, lesions, ulcers. No induration Neurologic: CN 2-12 grossly intact. Sensation intact, DTR normal. Strength 5/5 in all 4.  Psychiatric: Normal judgment and insight. Alert and oriented x 3. Normal mood.     Labs on Admission: I have personally reviewed following labs and imaging studies  CBC: Recent Labs  Lab 04/10/21 0925  WBC 10.9*  HGB 11.9*  HCT 37.7*  MCV 97.9  PLT 998   Basic Metabolic Panel: Recent Labs  Lab 04/10/21 0925  NA 131*  K 4.1  CL 98  CO2 28  GLUCOSE 119*  BUN 24*  CREATININE 1.56*  CALCIUM 8.1*   GFR: Estimated Creatinine Clearance: 42 mL/min (A) (by C-G formula based on SCr of 1.56 mg/dL (H)). Liver Function Tests: No results for input(s): AST, ALT, ALKPHOS, BILITOT, PROT, ALBUMIN in the last 168 hours. No results for input(s): LIPASE, AMYLASE in the last 168 hours. No results for input(s): AMMONIA in the last 168 hours. Coagulation Profile: No results for input(s): INR, PROTIME in the last 168 hours. Cardiac Enzymes: No results for input(s): CKTOTAL, CKMB, CKMBINDEX, TROPONINI in the last 168 hours. BNP (last 3 results) No results for input(s): PROBNP in the last 8760 hours. HbA1C: No results for input(s): HGBA1C in the last 72 hours. CBG: Recent Labs  Lab 04/10/21 1143  GLUCAP 135*   Lipid Profile: No results for input(s): CHOL, HDL, LDLCALC, TRIG, CHOLHDL, LDLDIRECT in the last 72 hours. Thyroid Function Tests: No results for  input(s): TSH, T4TOTAL, FREET4, T3FREE, THYROIDAB in the last 72 hours. Anemia Panel: No results for input(s): VITAMINB12, FOLATE, FERRITIN, TIBC, IRON, RETICCTPCT in the last 72 hours. Urine analysis:    Component Value Date/Time   COLORURINE YELLOW 04/10/2021 [place] 04/10/2021 1304   LABSPEC 1.008 04/10/2021 1304   PHURINE 6.0 04/10/2021 1304   GLUCOSEU NEGATIVE 04/10/2021 1304   HGBUR NEGATIVE 04/10/2021 [place] 04/10/2021 1304   [place] 04/10/2021 1304   PROTEINUR NEGATIVE 04/10/2021 1304   UROBILINOGEN 0.2 07/18/2015 1154   NITRITE NEGATIVE 04/10/2021 1304   LEUKOCYTESUR NEGATIVE 04/10/2021 1304    Radiological Exams on Admission: DG Chest 2 View  Result Date: 04/10/2021 CLINICAL DATA:  80 year old male with history of decreased oxygen saturations and shortness of breath. EXAM: CHEST - 2 VIEW COMPARISON:  Chest x-ray 01/09/2021. FINDINGS: Lung volumes are low. Widespread areas of interstitial prominence and patchy airspace disease noted throughout the lungs bilaterally, most evident throughout the mid to lower lungs. Probable trace bilateral pleural effusions. No pneumothorax. No evidence of pulmonary edema. Heart size is normal. Upper mediastinal contours are within normal limits. Left-sided pacemaker/AICD in place with lead tips projecting over the expected location of the right atrium and right ventricle. IMPRESSION: 1. The appearance of the chest is concerning for multilobar bilateral pneumonia. Clinical correlation for signs and symptoms of viral infection is recommended. 2. Trace bilateral pleural effusions. 3. Aortic atherosclerosis. Electronically Signed   By: Vinnie Langton M.D.   On: 04/10/2021 10:01    EKG: Independently reviewed.  Sinus, similar QRS changes  on chest leads as before  Assessment/Plan Active Problems:   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   COPD (chronic obstructive  pulmonary disease) (HCC)  (please populate well all problems here in Problem List. (For example, if patient is on BP meds at home and you resume or decide to hold them, it is a problem that needs to be her. Same for CAD, COPD, HLD and so on)  Acute on chronic hypoxic respite failure -Likely COPD exacerbation. -Breathing treatment with IV Solu-Medrol x1 day then switch to p.o. for course prednisone.  Question of atypical pneumonia -Start doxycycline for short course -Check urine Legionella antigen and mycoplasma antibody  PAF -Sinus rhythm -Continue amiodarone daily, x-ray showed chronic increased interstitial markings which appears to be stable as before.  No CAT scan in the last 3 to 4 years.  Recommend follow-up with cardiologist versus pulmonologist to monitor amiodarone side effect particularly interstitial fibrosis. Check LFT. -Continue Eliquis  Chronic systolic CHF -Euvolemic, continue home dose of diuresis  CKD stage II -Euvolemic, continue torsemide  Hypothyroidism -Continue Synthroid  BPH -Continue Flomax  Anxiety/depression -SSRI  DVT prophylaxis: Eliquis code Status: Full code Family Communication: None at bedside Disposition Plan: Expect more than 2 midnight hospital stay to wean down oxygen, also order PT evaluation. Consults called: None Admission status: Telemetry admission   Lequita Halt MD [facility] Pager [phone]  04/10/2021, 2:47 PM

## 2021-04-10 NOTE — Plan of Care (Signed)

## 2021-04-11 DIAGNOSIS — J441 Chronic obstructive pulmonary disease with (acute) exacerbation: Secondary | ICD-10-CM | POA: Diagnosis not present

## 2021-04-11 LAB — BASIC METABOLIC PANEL
Anion gap: 6 (ref 5–15)
BUN: 28 mg/dL — ABNORMAL HIGH (ref 8–23)
CO2: 27 mmol/L (ref 22–32)
Calcium: 8.6 mg/dL — ABNORMAL LOW (ref 8.9–10.3)
Chloride: 101 mmol/L (ref 98–111)
Creatinine, Ser: 1.38 mg/dL — ABNORMAL HIGH (ref 0.61–1.24)
GFR, Estimated: 51 mL/min — ABNORMAL LOW (ref >60)
Glucose, Bld: 168 mg/dL — ABNORMAL HIGH (ref 70–99)
Potassium: 3.8 mmol/L (ref 3.5–5.1)
Sodium: 134 mmol/L — ABNORMAL LOW (ref 135–145)

## 2021-04-11 LAB — MYCOPLASMA PNEUMONIAE ANTIBODY, IGM: Mycoplasma pneumo IgM: <770 U/mL (ref 0–769)

## 2021-04-11 LAB — LEGIONELLA PNEUMOPHILA SEROGP 1 UR AG: L. pneumophila Serogp 1 Ur Ag: NEGATIVE

## 2021-04-11 MED ORDER — METHYLPREDNISOLONE SODIUM SUCC 125 MG IJ SOLR
60.0000 mg | Freq: Two times a day (BID) | INTRAMUSCULAR | Status: DC
  Administered 2021-04-11 – 2021-04-12 (×3): 60 mg via INTRAVENOUS
  Filled 2021-04-11 (×3): qty 2

## 2021-04-11 NOTE — Progress Notes (Signed)
[facility] Progress Note  Patient: Corey Tyler    JEH:[id number]  DOA: 04/10/2021     Date of Service: the patient was seen and examined on 04/11/2021  Brief hospital course: Past medical history of COPD, chronic respiratory failure on 3.5 LPM, PAF, chronic systolic CHF, EF 37%, AICD implant, CKD stage II, hypothyroidism, BPH, recent COVID infection on 12/2020. Presents with complaints of wheezing and shortness of breath found to have COPD exacerbation with worsening hypoxia Currently plan is continue current treatment.  Assessment and Plan: 1.  Acute on chronic hypoxic respiratory failure, POA, COPD exacerbation from bronchitis Currently on IV Solu-Medrol. Also on doxycycline. Continue nebulization therapy. Oxygenation improving.  Currently on 4.5 L of oxygen.  Satting 90%.  At rest.  2.  Paroxysmal A. fib Currently sinus rhythm. Continue amiodarone, Eliquis. Outpatient follow-up with cardiology to consider amiodarone toxicity as a cause of worsening hypoxia.  3.  Chronic systolic CHF Appears euvolemic. Currently on home dose of diuretic. If the renal function worsens or hypoxia does not improve patient may require further work-up  4.  Hypothyroidism Continue Synthroid  5.  Chronic kidney disease stage II BPH Continue Flomax. Appears euvolemic. Currently on torsemide.  Monitor.  6.  Anxiety Continue home regimen  Body mass index is 27.07 kg/m.  Nutrition Problem: Increased nutrient needs Etiology: acute illness Interventions: Interventions: Magic cup,MVI      Diet: Cardiac diet DVT Prophylaxis:    apixaban (ELIQUIS) tablet 5 mg    Advance goals of care discussion: Full code  Family Communication: no family was present at bedside, at the time of interview.   Disposition:  Status is: Inpatient  Remains inpatient appropriate because:IV treatments appropriate due to intensity of illness or inability to take PO   Dispo: The patient is from: Home               Anticipated d/c is to: Home              Patient currently is not medically stable to d/c.   Difficult to place patient No        Subjective: No nausea no vomiting.  No fever no chills.  Continues to have cough and shortness of breath.  Also has fatigue and tiredness.  Oxygen dropped to 82% on 6 L  Physical Exam:  General: Appear in mild distress, no Rash; Oral Mucosa Clear, moist. no Abnormal Neck Mass Or lumps, Conjunctiva normal  Cardiovascular: S1 and S2 Present, no Murmur, Respiratory: good respiratory effort, Bilateral Air entry present and CTA, no Crackles, no wheezes Abdomen: Bowel Sound present, Soft and no tenderness Extremities: no Pedal edema Neurology: alert and oriented to time, place, and person affect appropriate. no new focal deficit Gait not checked due to patient safety concerns    Vitals:   04/11/21 0736 04/11/21 0822 04/11/21 0900 04/11/21 1711  BP:  (!) 99/58 (!) 108/56 (!) 110/56  Pulse: 73 65  70  Resp:  15 14 16   Temp: 98.4 F (36.9 C)   98 F (36.7 C)  TempSrc: Oral   Oral  SpO2:  95%  95%  Weight:      Height:        Intake/Output Summary (Last 24 hours) at 04/11/2021 1916 Last data filed at 04/11/2021 1400 Gross per 24 hour  Intake 1280 ml  Output 1260 ml  Net 20 ml   Filed Weights   04/10/21 1223 04/11/21 0232  Weight: 90.7 kg 93.1 kg    Data  Reviewed: I have personally reviewed and interpreted daily labs, tele strips, imaging. I reviewed all nursing notes, pharmacy notes, vitals, pertinent old records I have discussed plan of care as described above with RN and patient/family.  CBC: Recent Labs  Lab 04/10/21 0925  WBC 10.9*  HGB 11.9*  HCT 37.7*  MCV 97.9  PLT 102   Basic Metabolic Panel: Recent Labs  Lab 04/10/21 0925 04/11/21 0451  NA 131* 134*  K 4.1 3.8  CL 98 101  CO2 28 27  GLUCOSE 119* 168*  BUN 24* 28*  CREATININE 1.56* 1.38*  CALCIUM 8.1* 8.6*    Studies: No results found.  Scheduled  Meds: . albuterol  2.5 mg Nebulization Q6H  . amiodarone  200 mg Oral Daily  . apixaban  5 mg Oral BID  . cholecalciferol  1,000 Units Oral Daily  . doxycycline  100 mg Oral Q12H  . finasteride  5 mg Oral Daily  . fluticasone furoate-vilanterol  1 puff Inhalation Daily  . guaiFENesin  1,200 mg Oral BID  . levothyroxine  125 mcg Oral QAC breakfast  . methylPREDNISolone (SOLU-MEDROL) injection  60 mg Intravenous BID  . metoprolol succinate  25 mg Oral QHS  . multivitamin with minerals  1 tablet Oral Daily  . potassium chloride  10 mEq Oral Daily  . psyllium  1 packet Oral Daily  . ranolazine  1,000 mg Oral BID  . tamsulosin  0.4 mg Oral QHS  . torsemide  20 mg Oral Once per day on Mon Tue Thu Fri  . [START ON 04/12/2021] torsemide  30 mg Oral Once per day on Sun Wed Sat  . umeclidinium bromide  1 puff Inhalation Daily   Continuous Infusions: PRN Meds: acetaminophen, albuterol, melatonin, polyvinyl alcohol, promethazine, sodium chloride, traZODone  Time spent: 35 minutes  Author: Berle Mull, MD [facility] 04/11/2021 7:16 PM  To reach On-call, see care teams to locate the attending and reach out via [email or web address]. Between 7PM-7AM, please contact night-coverage If you still have difficulty reaching the attending provider, please page the [facility] (Director on Call) for [facility] on amion for assistance.

## 2021-04-11 NOTE — Progress Notes (Signed)
   04/11/21 1614  Mobility  Activity Refused mobility (Pt declined d/t fatigue from working with PT/OT earlier)

## 2021-04-11 NOTE — Evaluation (Signed)
Occupational Therapy Evaluation Patient Details Name: Corey Tyler MRN: [id number] DOB: August 24, 1941 Today's Date: 04/11/2021    History of Present Illness Corey Tyler is a 80 y.o. male with medical history significant of COPD Gold stage II, chronic O2 dependent hypoxic renal failure on 3.5 liters around-the-clock, PAF, chronic systolic CHF LVEF 30 to 77% status post AICD, CKD stage II, hypothyroidism, BPH, recent COVID infection in late February 2022, presented with increasing shortness of breath and wheezing.   Clinical Impression   Pt admitted with the above diagnoses and presents with below problem list. Pt will benefit from continued acute OT to address the below listed deficits and maximize independence with basic ADLs prior to d/c home. PTA pt utilized a rollator, limited mobility, mod I with bathing/dressing. Pt walked the distance it would likely take to access his bathroom at home on 6L O2. DOE 2/4, sats around 84 after sitting down. He was, with time, able to recover sats to low 90s on 5L.      Follow Up Recommendations  Other (comment) (declined HHOT)    Equipment Recommendations  None recommended by OT    Recommendations for Other Services PT consult     Precautions / Restrictions Precautions Precaution Comments: watch sats Restrictions Weight Bearing Restrictions: No      Mobility Bed Mobility               General bed mobility comments: EOB and OOB at start and end of session    Transfers   Equipment used: Rolling walker (2 wheeled)             General transfer comment: min guard for safety. to/from EOB and recliner    Balance Overall balance assessment: Needs assistance Sitting-balance support: No upper extremity supported;Feet supported Sitting balance-Leahy Scale: Good     Standing balance support: Bilateral upper extremity supported;During functional activity Standing balance-Leahy Scale: Poor Standing balance comment: rollator at  baseline                           ADL either performed or assessed with clinical judgement   ADL Overall ADL's : Needs assistance/impaired Eating/Feeding: Set up;Sitting   Grooming: Set up;Sitting   Upper Body Bathing: Set up;Sitting   Lower Body Bathing: Min guard;Sit to/from stand   Upper Body Dressing : Set up;Sitting   Lower Body Dressing: Min guard;Sit to/from stand   Toilet Transfer: Min guard;Ambulation;RW   Toileting- Clothing Manipulation and Hygiene: Supervision/safety;Min guard;Sitting/lateral lean;Sit to/from stand   Tub/ Shower Transfer: Walk-in shower;Min guard;Ambulation;Tub bench;Shower seat;Rolling walker   Functional mobility during ADLs: Min guard;Rolling walker General ADL Comments: Pt completed functional distance mobility (ie distance to the bathroom at home) at min guard level. On 6L for mobility with sats 84 once in seated position. Pt was able to recover to sats     Vision         Perception     Praxis      Pertinent Vitals/Pain Pain Assessment: No/denies pain     Hand Dominance     Extremity/Trunk Assessment Upper Extremity Assessment Upper Extremity Assessment: Overall WFL for tasks assessed   Lower Extremity Assessment Lower Extremity Assessment: Defer to PT evaluation       Communication Communication Communication: No difficulties   Cognition Arousal/Alertness: Awake/alert Behavior During Therapy: WFL for tasks assessed/performed Overall Cognitive Status: Within Functional Limits for tasks assessed  General Comments       Exercises     Shoulder Instructions      Home Living Family/patient expects to be discharged to:: Private residence Living Arrangements: Spouse/significant other Available Help at Discharge: Family;Available 24 hours/day Type of Home: Other(Comment) (townhouse) Home Access: Level entry     Home Layout: One level     Bathroom  Shower/Tub: Occupational psychologist: Standard     Home Equipment: Environmental consultant - 4 wheels;Shower seat;Wheelchair - manual          Prior Functioning/Environment Level of Independence: Independent with assistive device(s)        Comments: Using rollator for ambulation. Independent with ADLs; requiring assist for IADLs.        OT Problem List: Decreased activity tolerance;Impaired balance (sitting and/or standing);Decreased knowledge of use of DME or AE;Decreased knowledge of precautions;Cardiopulmonary status limiting activity      OT Treatment/Interventions: Self-care/ADL training;Therapeutic exercise;Energy conservation;DME and/or AE instruction;Therapeutic activities;Patient/family education;Balance training    OT Goals(Current goals can be found in the care plan section) Acute Rehab OT Goals Patient Stated Goal: improve activity tolerance to be able to go out to eat OT Goal Formulation: With patient Time For Goal Achievement: 04/25/21 Potential to Achieve Goals: Good ADL Goals Pt Will Perform Lower Body Bathing: with modified independence;sit to/from stand Pt Will Perform Lower Body Dressing: with modified independence;sit to/from stand Pt Will Transfer to Toilet: with modified independence;ambulating Pt Will Perform Toileting - Clothing Manipulation and hygiene: with modified independence;sit to/from stand  OT Frequency: Min 2X/week   Barriers to D/C:            Co-evaluation              AM-PAC OT "6 Clicks" Daily Activity     Outcome Measure Help from another person eating meals?: None Help from another person taking care of personal grooming?: None Help from another person toileting, which includes using toliet, bedpan, or urinal?: A Little Help from another person bathing (including washing, rinsing, drying)?: A Little Help from another person to put on and taking off regular upper body clothing?: None Help from another person to put on and taking  off regular lower body clothing?: A Little 6 Click Score: 21   End of Session Equipment Utilized During Treatment: Rolling walker;Oxygen (5L at start and end of session, 6L to walk in the room.)  Activity Tolerance: Other (comment);Patient tolerated treatment well (DOE 2/4) Patient left: in chair;with call bell/phone within reach  OT Visit Diagnosis: Unsteadiness on feet (R26.81);Muscle weakness (generalized) (M62.81)                Time: 2725-3664 OT Time Calculation (min): 22 min Charges:  OT General Charges $OT Visit: 1 Visit OT Evaluation $OT Eval Low Complexity: [place], OT Acute Rehabilitation Services Pager: [phone] Office: [phone]   Corey Tyler 04/11/2021, 12:23 PM

## 2021-04-11 NOTE — Progress Notes (Signed)
Initial Nutrition Assessment  DOCUMENTATION CODES:  Not applicable  INTERVENTION:  Add Magic cup TID with meals, each supplement provides 290 kcal and 9 grams of protein.  Continue MVI with minerals daily.  NUTRITION DIAGNOSIS:  Increased nutrient needs related to acute illness as evidenced by estimated needs.  GOAL:  Patient will meet greater than or equal to 90% of their needs  MONITOR:  PO intake,Supplement acceptance,Labs,Weight trends,I & O's  REASON FOR ASSESSMENT:  Consult Assessment of nutrition requirement/status  ASSESSMENT:  80 yo male with a PMH of COPD Gold stage II, chronic O2 dependent hypoxic respiratory failure on 3.5 L, PAD, chronic systolic CHF LVEF 42-70% s/p AICD, CKD stage II, hypothyroidism, BPH, and recent COVID infection in late 12/2020 who presents with COPD with emphysema Gold C.  Spoke with pt at bedside. Pt reports that he is starting to feel better but still coughing. He reports eating well at home and gave a usual day recall. For breakfast, he usually has sausage or bacon with eggs. For lunch, he usually has a sandwich, donut, or 2-3 boiled eggs. For dinner, he usually has a meat, vegetable, and potatoes or spaghetti. Per Epic, pt ate 100% of dinner last night and 100% of breakfast this morning.  He denies any significant weight changes that he is aware of. Per Epic, pt's weight has fluctuated between 90-95 kg consistently over the past year or so.  On exam, pt had few depletions likely due to age and not poor PO intake.  Recommend adding Magic Cup TID for increased needs and continuing MVI with minerals daily.  Medications: reviewed; Vitamin D3, Synthroid, Solu-Medrol, MVI with minerals, Klor-Con 10 mEq  Labs: reviewed; Na 134, Glucose 168  NUTRITION - FOCUSED PHYSICAL EXAM: Flowsheet Row Most Recent Value  Orbital Region Mild depletion  Upper Arm Region No depletion  Thoracic and Lumbar Region No depletion  Buccal Region Mild depletion   Temple Region Mild depletion  Clavicle Bone Region No depletion  Clavicle and Acromion Bone Region No depletion  Scapular Bone Region No depletion  Dorsal Hand No depletion  Patellar Region No depletion  Anterior Thigh Region No depletion  Posterior Calf Region No depletion  Edema (RD Assessment) None  Hair Reviewed  Eyes Reviewed  Mouth Reviewed  Skin Reviewed  Nails Reviewed     Diet Order:   Diet Order            Diet Heart Room service appropriate? Yes; Fluid consistency: Thin  Diet effective now                EDUCATION NEEDS:  Education needs have been addressed  Skin:  Skin Assessment: Reviewed RN Assessment  Last BM:  04/09/21  Height:  Ht Readings from Last 1 Encounters:  04/10/21 6\' 1"  (1.854 m)   Weight:  Wt Readings from Last 1 Encounters:  04/11/21 93.1 kg   Ideal Body Weight:  83.6 kg  BMI:  Body mass index is 27.07 kg/m.  Estimated Nutritional Needs:  Kcal:  1700-1900 Protein:  85-100 grams Fluid:  >1.7 L  Derrel Nip, RD, LDN Registered Dietitian After Hours/Weekend Pager # in [place]

## 2021-04-11 NOTE — Plan of Care (Signed)
  Problem: Activity: Goal: Capacity to carry out activities will improve Outcome: Completed/Met   Problem: Skin Integrity: Goal: Risk for impaired skin integrity will decrease Outcome: Completed/Met   Problem: Safety: Goal: Ability to remain free from injury will improve Outcome: Completed/Met   Problem: Pain Managment: Goal: General experience of comfort will improve Outcome: Completed/Met   Problem: Coping: Goal: Level of anxiety will decrease Outcome: Completed/Met   Problem: Nutrition: Goal: Adequate nutrition will be maintained Outcome: Completed/Met

## 2021-04-11 NOTE — Evaluation (Signed)
Physical Therapy Evaluation Patient Details Name: Corey Tyler MRN: [id number] DOB: 24-Feb-1941 Today's Date: 04/11/2021   History of Present Illness  CEASAR DECANDIA is a 80 y.o. male with medical history significant of COPD Gold stage II, chronic O2 dependent hypoxic renal failure on 3.5 liters around-the-clock, PAF, chronic systolic CHF LVEF 30 to 40% status post AICD, CKD stage II, hypothyroidism, BPH, recent COVID infection in late February 2022, presented with increasing shortness of breath and wheezing.  Clinical Impression  Pt admitted with/for worsening SOB.  Pt is returning toward baseline function, but still needing more supplementary oxygen than baseline.   Pt currently limited functionally due to the problems listed below.  (see problems list.)  Pt will benefit from PT to maximize function and safety to be able to get home safely with available assist .     Follow Up Recommendations No PT follow up    Equipment Recommendations  None recommended by PT    Recommendations for Other Services       Precautions / Restrictions Precautions Precaution Comments: watch sats Restrictions Weight Bearing Restrictions: No      Mobility  Bed Mobility               General bed mobility comments: OOB on arrival    Transfers Overall transfer level: Needs assistance Equipment used: 4-wheeled walker Transfers: Sit to/from Stand Sit to Stand: Supervision         General transfer comment: used UE's appropriately  Ambulation/Gait Ambulation/Gait assistance: Supervision Gait Distance (Feet): 60 Feet Assistive device: 4-wheeled walker Gait Pattern/deviations: Step-through pattern   Gait velocity interpretation: 1.31 - 2.62 ft/sec, indicative of limited community ambulator General Gait Details: generally steady, used the Rollator appropriately, limited by dyspnea and SpO2 dropping down to 82% on 6L [place] at 82 bpm.  Recovered to 90% at 79 bpm in ~1.5 minutes.  Pt placed back  on 3.5 L once at rest.  Stairs            Wheelchair Mobility    Modified Rankin (Stroke Patients Only)       Balance Overall balance assessment: Needs assistance Sitting-balance support: No upper extremity supported;Feet supported Sitting balance-Leahy Scale: Good     Standing balance support: Bilateral upper extremity supported;During functional activity Standing balance-Leahy Scale: Fair Standing balance comment: rollator at baseline, but can stand statically                             Pertinent Vitals/Pain Pain Assessment: No/denies pain    Home Living Family/patient expects to be discharged to:: Private residence Living Arrangements: Spouse/significant other Available Help at Discharge: Family;Available 24 hours/day Type of Home: Other(Comment) (townhouse) Home Access: Level entry     Home Layout: One level Home Equipment: Walker - 4 wheels;Shower seat;Wheelchair - manual      Prior Function Level of Independence: Independent with assistive device(s)         Comments: Using rollator for ambulation. Independent with ADLs; requiring assist for IADLs.     Hand Dominance        Extremity/Trunk Assessment   Upper Extremity Assessment Upper Extremity Assessment: Overall WFL for tasks assessed    Lower Extremity Assessment Lower Extremity Assessment: Overall WFL for tasks assessed       Communication   Communication: No difficulties  Cognition Arousal/Alertness: Awake/alert Behavior During Therapy: WFL for tasks assessed/performed Overall Cognitive Status: Within Functional Limits for tasks assessed  General Comments      Exercises     Assessment/Plan    PT Assessment Patient needs continued PT services  PT Problem List Decreased activity tolerance;Decreased mobility;Decreased knowledge of use of DME;Cardiopulmonary status limiting activity       PT Treatment  Interventions Gait training;Stair training;Functional mobility training;Therapeutic activities;Patient/family education    PT Goals (Current goals can be found in the Care Plan section)  Acute Rehab PT Goals Patient Stated Goal: improve activity tolerance to be able to go out to eat PT Goal Formulation: With patient Time For Goal Achievement: 04/18/21 Potential to Achieve Goals: Good    Frequency Min 3X/week   Barriers to discharge        Co-evaluation               AM-PAC PT "6 Clicks" Mobility  Outcome Measure Help needed turning from your back to your side while in a flat bed without using bedrails?: None Help needed moving from lying on your back to sitting on the side of a flat bed without using bedrails?: None Help needed moving to and from a bed to a chair (including a wheelchair)?: A Little Help needed standing up from a chair using your arms (e.g., wheelchair or bedside chair)?: A Little Help needed to walk in hospital room?: A Little Help needed climbing 3-5 steps with a railing? : A Little 6 Click Score: 20    End of Session Equipment Utilized During Treatment: Oxygen Activity Tolerance: Patient tolerated treatment well;Patient limited by fatigue Patient left: in chair;with call bell/phone within reach Nurse Communication: Mobility status PT Visit Diagnosis: Other abnormalities of gait and mobility (R26.89);Difficulty in walking, not elsewhere classified (R26.2)    Time: 7341-9379 PT Time Calculation (min) (ACUTE ONLY): 19 min   Charges:   PT Evaluation $PT Eval Moderate Complexity: 1 Mod          04/11/2021  Ginger Carne., PT Acute Rehabilitation Services [phone]  (pager) [phone]  (office)  Tessie Fass Muscab Brenneman 04/11/2021, 2:44 PM

## 2021-04-12 DIAGNOSIS — J441 Chronic obstructive pulmonary disease with (acute) exacerbation: Secondary | ICD-10-CM | POA: Diagnosis not present

## 2021-04-12 LAB — BASIC METABOLIC PANEL
Anion gap: 10 (ref 5–15)
BUN: 40 mg/dL — ABNORMAL HIGH (ref 8–23)
CO2: 26 mmol/L (ref 22–32)
Calcium: 8.9 mg/dL (ref 8.9–10.3)
Chloride: 99 mmol/L (ref 98–111)
Creatinine, Ser: 1.76 mg/dL — ABNORMAL HIGH (ref 0.61–1.24)
GFR, Estimated: 38 mL/min — ABNORMAL LOW (ref >60)
Glucose, Bld: 166 mg/dL — ABNORMAL HIGH (ref 70–99)
Potassium: 4.1 mmol/L (ref 3.5–5.1)
Sodium: 135 mmol/L (ref 135–145)

## 2021-04-12 MED ORDER — ALBUTEROL SULFATE (2.5 MG/3ML) 0.083% IN NEBU
2.5000 mg | INHALATION_SOLUTION | Freq: Two times a day (BID) | RESPIRATORY_TRACT | Status: DC
  Administered 2021-04-12: 2.5 mg via RESPIRATORY_TRACT
  Filled 2021-04-12: qty 3

## 2021-04-12 MED ORDER — APIXABAN 2.5 MG PO TABS
2.5000 mg | ORAL_TABLET | Freq: Two times a day (BID) | ORAL | Status: DC
  Administered 2021-04-12 – 2021-04-13 (×2): 2.5 mg via ORAL
  Filled 2021-04-12 (×2): qty 1

## 2021-04-12 MED ORDER — METHYLPREDNISOLONE SODIUM SUCC 40 MG IJ SOLR
40.0000 mg | Freq: Every day | INTRAMUSCULAR | Status: DC
  Administered 2021-04-13: 40 mg via INTRAVENOUS
  Filled 2021-04-12 (×2): qty 1

## 2021-04-12 MED ORDER — ZOLPIDEM TARTRATE 5 MG PO TABS
5.0000 mg | ORAL_TABLET | Freq: Every evening | ORAL | Status: DC | PRN
  Administered 2021-04-13: 5 mg via ORAL
  Filled 2021-04-12: qty 1

## 2021-04-12 MED ORDER — TORSEMIDE 20 MG PO TABS
20.0000 mg | ORAL_TABLET | Freq: Every day | ORAL | Status: DC
  Administered 2021-04-12: 20 mg via ORAL
  Filled 2021-04-12: qty 1

## 2021-04-12 NOTE — Discharge Instructions (Addendum)

## 2021-04-12 NOTE — Progress Notes (Signed)
[facility] Progress Note  Patient: Corey Tyler    ION:[id number]  DOA: 04/10/2021     Date of Service: the patient was seen and examined on 04/12/2021  Brief hospital course: Past medical history of COPD, chronic respiratory failure on 3.5 LPM, PAF, chronic systolic CHF, EF 24%, AICD implant, CKD stage II, hypothyroidism, BPH, recent COVID infection on 12/2020. Presents with complaints of wheezing and shortness of breath found to have COPD exacerbation with worsening hypoxia Currently plan is continue current treatment.  Assessment and Plan: 1.  Acute on chronic hypoxic respiratory failure, POA, COPD exacerbation from bronchitis Currently on IV Solu-Medrol.  Reduce the dose from 60 twice daily to 40 daily. Also on doxycycline. Continue nebulization therapy. Oxygenation improving.  Currently on 4.5 L of oxygen.  Satting 85% %.  After exertion in bed.  2.  Paroxysmal A. fib Currently sinus rhythm. Continue amiodarone, Eliquis. Outpatient follow-up with cardiology to consider amiodarone toxicity as a cause of worsening hypoxia.  3.  Chronic systolic CHF Appears euvolemic. Currently on home dose of diuretic. If the renal function worsens or hypoxia does not improve patient may require further work-up  4.  Hypothyroidism Continue Synthroid  5.  Chronic kidney disease stage II BPH Continue Flomax. Appears euvolemic. Currently on torsemide. Mild worsening of his renal function.  Monitor.  6.  Anxiety Continue home regimen  7.  Insomnia. Requesting something to help with sleep. Tells me that the trazodone and melatonin are not effective. Will try Ambien.  Body mass index is 27.48 kg/m.  Nutrition Problem: Increased nutrient needs Etiology: acute illness Interventions: Interventions: Magic cup,MVI      Diet: Cardiac diet DVT Prophylaxis:   apixaban (ELIQUIS) tablet 2.5 mg Start: 04/12/21 2200 apixaban (ELIQUIS) tablet 2.5 mg    Advance goals of care  discussion: Full code  Family Communication: no family was present at bedside, at the time of interview.   Disposition:  Status is: Inpatient  Remains inpatient appropriate because:IV treatments appropriate due to intensity of illness or inability to take PO   Dispo: The patient is from: Home              Anticipated d/c is to: Home              Patient currently is not medically stable to d/c.   Difficult to place patient No        Subjective: Continues to have shortness of breath no nausea no vomiting but no fever no chills.  No chest pain or chest tightness.  Physical Exam:  General: Appear in mild distress, no Rash; Oral Mucosa Clear, moist. no Abnormal Neck Mass Or lumps, Conjunctiva normal  Cardiovascular: S1 and S2 Present, no Murmur, Respiratory: good respiratory effort, Bilateral Air entry present and CTA, no Crackles, no wheezes Abdomen: Bowel Sound present, Soft and no tenderness Extremities: no Pedal edema Neurology: alert and oriented to time, place, and person affect appropriate. no new focal deficit Gait not checked due to patient safety concerns  Vitals:   04/12/21 0757 04/12/21 0758 04/12/21 1226 04/12/21 1703  BP:  116/75 127/66 110/64  Pulse:  70 81 66  Resp:  20 16 20   Temp:  97.8 F (36.6 C) 97.6 F (36.4 C) 98.2 F (36.8 C)  TempSrc:  Oral Oral Oral  SpO2: 92% 92% 91% 94%  Weight:      Height:        Intake/Output Summary (Last 24 hours) at 04/12/2021 1933 Last data filed at  04/12/2021 1800 Gross per 24 hour  Intake 1200 ml  Output 1900 ml  Net -700 ml   Filed Weights   04/10/21 1223 04/11/21 0232 04/12/21 0309  Weight: 90.7 kg 93.1 kg 94.5 kg    Data Reviewed: I have personally reviewed and interpreted daily labs, tele strips, imaging. I reviewed all nursing notes, pharmacy notes, vitals, pertinent old records I have discussed plan of care as described above with RN and patient/family.  CBC: Recent Labs  Lab 04/10/21 0925  WBC  10.9*  HGB 11.9*  HCT 37.7*  MCV 97.9  PLT 194   Basic Metabolic Panel: Recent Labs  Lab 04/10/21 0925 04/11/21 0451 04/12/21 0234  NA 131* 134* 135  K 4.1 3.8 4.1  CL 98 101 99  CO2 28 27 26   GLUCOSE 119* 168* 166*  BUN 24* 28* 40*  CREATININE 1.56* 1.38* 1.76*  CALCIUM 8.1* 8.6* 8.9    Studies: No results found.  Scheduled Meds: . albuterol  2.5 mg Nebulization BID  . amiodarone  200 mg Oral Daily  . apixaban  2.5 mg Oral BID  . cholecalciferol  1,000 Units Oral Daily  . doxycycline  100 mg Oral Q12H  . finasteride  5 mg Oral Daily  . fluticasone furoate-vilanterol  1 puff Inhalation Daily  . guaiFENesin  1,200 mg Oral BID  . levothyroxine  125 mcg Oral QAC breakfast  . [START ON 04/13/2021] methylPREDNISolone (SOLU-MEDROL) injection  40 mg Intravenous Daily  . metoprolol succinate  25 mg Oral QHS  . multivitamin with minerals  1 tablet Oral Daily  . psyllium  1 packet Oral Daily  . ranolazine  1,000 mg Oral BID  . tamsulosin  0.4 mg Oral QHS  . umeclidinium bromide  1 puff Inhalation Daily   Continuous Infusions: PRN Meds: acetaminophen, albuterol, melatonin, polyvinyl alcohol, promethazine, sodium chloride, zolpidem  Time spent: 35 minutes  Author: Berle Mull, MD [facility] 04/12/2021 7:33 PM  To reach On-call, see care teams to locate the attending and reach out via [email or web address]. Between 7PM-7AM, please contact night-coverage If you still have difficulty reaching the attending provider, please page the [facility] (Director on Call) for [facility] on amion for assistance.

## 2021-04-13 ENCOUNTER — Inpatient Hospital Stay (HOSPITAL_COMMUNITY): Payer: No Typology Code available for payment source

## 2021-04-13 DIAGNOSIS — I361 Nonrheumatic tricuspid (valve) insufficiency: Secondary | ICD-10-CM

## 2021-04-13 DIAGNOSIS — R079 Chest pain, unspecified: Secondary | ICD-10-CM

## 2021-04-13 DIAGNOSIS — I340 Nonrheumatic mitral (valve) insufficiency: Secondary | ICD-10-CM

## 2021-04-13 DIAGNOSIS — I351 Nonrheumatic aortic (valve) insufficiency: Secondary | ICD-10-CM

## 2021-04-13 LAB — BASIC METABOLIC PANEL
Anion gap: 7 (ref 5–15)
BUN: 42 mg/dL — ABNORMAL HIGH (ref 8–23)
CO2: 29 mmol/L (ref 22–32)
Calcium: 8.5 mg/dL — ABNORMAL LOW (ref 8.9–10.3)
Chloride: 99 mmol/L (ref 98–111)
Creatinine, Ser: 1.56 mg/dL — ABNORMAL HIGH (ref 0.61–1.24)
GFR, Estimated: 44 mL/min — ABNORMAL LOW (ref >60)
Glucose, Bld: 117 mg/dL — ABNORMAL HIGH (ref 70–99)
Potassium: 4.5 mmol/L (ref 3.5–5.1)
Sodium: 135 mmol/L (ref 135–145)

## 2021-04-13 LAB — TROPONIN I (HIGH SENSITIVITY)
Troponin I (High Sensitivity): 17 ng/L (ref <18)
Troponin I (High Sensitivity): 18 ng/L — ABNORMAL HIGH (ref <18)

## 2021-04-13 LAB — ECHOCARDIOGRAM LIMITED
Area-P 1/2: 3.65 cm2
Height: 73 in
S' Lateral: 4.3 cm
Weight: 3324.8 oz

## 2021-04-13 MED ORDER — DOXYCYCLINE HYCLATE 100 MG PO TABS: 100.0000 mg | ORAL_TABLET | Freq: Two times a day (BID) | ORAL | 0 refills | Status: DC

## 2021-04-13 MED ORDER — MELATONIN 5 MG PO TABS: 5.0000 mg | ORAL_TABLET | Freq: Every evening | ORAL | 0 refills | Status: AC | PRN

## 2021-04-13 MED ORDER — GUAIFENESIN ER 600 MG PO TB12: 1200.0000 mg | ORAL_TABLET | Freq: Two times a day (BID) | ORAL | 0 refills | Status: AC

## 2021-04-13 MED ORDER — BUPROPION HCL ER (XL) 150 MG PO TB24: 150.0000 mg | ORAL_TABLET | ORAL | 0 refills | Status: DC

## 2021-04-13 MED ORDER — PREDNISONE 10 MG PO TABS: ORAL_TABLET | ORAL | 0 refills | Status: DC

## 2021-04-13 MED ORDER — DOXYCYCLINE HYCLATE 100 MG PO TABS: 100.0000 mg | ORAL_TABLET | Freq: Two times a day (BID) | ORAL | 0 refills | Status: AC

## 2021-04-13 MED ORDER — NITROGLYCERIN 0.4 MG SL SUBL: 0.4000 mg | SUBLINGUAL_TABLET | SUBLINGUAL | Status: DC | PRN

## 2021-04-13 MED ORDER — METOPROLOL SUCCINATE ER 25 MG PO TB24: 12.5000 mg | ORAL_TABLET | Freq: Every day | ORAL | 0 refills | Status: AC

## 2021-04-13 MED ORDER — GUAIFENESIN ER 600 MG PO TB12: 1200.0000 mg | ORAL_TABLET | Freq: Two times a day (BID) | ORAL | 0 refills | Status: DC

## 2021-04-13 MED ORDER — METOPROLOL SUCCINATE ER 25 MG PO TB24: 25.0000 mg | ORAL_TABLET | Freq: Every day | ORAL | 0 refills | Status: DC

## 2021-04-13 NOTE — TOC Initial Note (Addendum)
Transition of Care [facility]) - Initial/Assessment Note    Patient Details  Name: Corey Tyler MRN: [id number] Date of Birth: 1941/01/08  Transition of [facility] CM/SW Contact:    Pollie Friar, RN Phone Number: 04/13/2021, 11:22 AM  Clinical Narrative:                 Patient states he lives with his spouse that can provide needed supervision.  He has oxygen already through [place] that the [place] pays for. CM will need to fax them the new orders and PT note (phone: [phone] ). Pt has an [employer] for when he is out of the home. Pt is asking for a 3 in 1 for home. He prefers it come through the [place]. CM will fax the orders for the DME to the [place]. They will have the DME sent to his home. Pt is aware and in agreement.  [facility]: [facility] Pt gets his meds through the [place] so if needs new meds at d/c would be best for TOC to fill them. TOC following for further d/c needs.    Expected Discharge Plan: Home/Self Care Barriers to Discharge: Continued Medical Work up   Patient Goals and CMS Choice     Choice offered to / list presented to : Patient  Expected Discharge Plan and Services Expected Discharge Plan: Home/Self Care   Discharge Planning Services: CM Consult Post Acute Care Choice: Durable Medical Equipment Living arrangements for the past 2 months: [place]                 DME Arranged: 3-N-1 DME Agency: [facility], [place]     Representative spoke with at DME Agency: orders faxed to: [phone] (urgent DME for [place])            Prior Living Arrangements/Services Living arrangements for the past 2 months: Single Family Home Lives with:: Spouse Patient language and need for interpreter reviewed:: Yes Do you feel safe going back to the place where you live?: Yes      Need for Family Participation in Patient Care: Yes (Comment) Care giver support system in place?: Yes (comment) Current home services: DME (electric wheelchair/ walker/  oxygen) Criminal Activity/Legal Involvement Pertinent to Current Situation/Hospitalization: No - Comment as needed  Activities of Daily Living Home Assistive Devices/Equipment: Engineer, drilling (specify type) ADL Screening (condition at time of admission) Patient's cognitive ability adequate to safely complete daily activities?: Yes Is the patient deaf or have difficulty hearing?: No Does the patient have difficulty seeing, even when wearing glasses/contacts?: No Does the patient have difficulty concentrating, remembering, or making decisions?: No Patient able to express need for assistance with ADLs?: Yes Does the patient have difficulty dressing or bathing?: No Independently performs ADLs?: Yes (appropriate for developmental age) Does the patient have difficulty walking or climbing stairs?: No Weakness of Legs: None Weakness of Arms/Hands: None  Permission Sought/Granted                  Emotional Assessment Appearance:: Appears stated age Attitude/Demeanor/Rapport: Engaged Affect (typically observed): Accepting Orientation: : Oriented to Self,Oriented to Place,Oriented to  Time,Oriented to Situation   Psych Involvement: No (comment)  Admission diagnosis:  COPD (chronic obstructive pulmonary disease) ([place]) [J44.9] COPD exacerbation ([place]) [J44.1] Patient Active Problem List   Diagnosis Date Noted  . COPD (chronic obstructive pulmonary disease) ([place]) 04/10/2021  . Coronary artery disease involving native coronary artery of native heart without angina pectoris 02/13/2021  . AAA (abdominal aortic  aneurysm) without rupture ([place]) 02/13/2021  . Syncope 12/09/2020  . Educated about COVID-19 virus infection 05/13/2020  . Orthostatic hypotension 12/29/2019  . NSVT (nonsustained ventricular tachycardia) ([place]) 12/29/2019  . Tremor 10/18/2019  . Hypomagnesemia 09/07/2019  . Hypokalemia 09/07/2019  . Stage 3a chronic kidney disease ([place]) 09/02/2019  . SBO (small bowel  obstruction) ([place]) 08/31/2019  . Chronic respiratory failure with hypoxia ([place]) 08/31/2019  . Chronic anticoagulation 08/31/2019  . Hypothyroidism 08/31/2019  . Chronic nausea 08/31/2019  . Generalized weakness 07/29/2019  . Acute respiratory failure ([place]) 09/15/2017  . COPD exacerbation ([place]) 09/15/2017  . Sustained ventricular tachycardia ([place]) 06/10/2017  . VT (ventricular tachycardia) ([place]) 11/02/2016  . Benign fibroma of prostate 04/02/2015  . Ventricular tachycardia, polymorphic ([place])   . Diverticulosis of colon without hemorrhage 09/25/2013  . Atrial fibrillation ([place]) 12/29/2012  . ICD (implantable cardioverter-defibrillator) in place   . Hyperlipidemia   . PVCs 11/26/2011  . Chronic systolic heart failure ([place]) 10/07/2011  . Cardiomyopathy, nonischemic and ischemic 09/24/2011  . 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 PULMONARY NODULE 12/04/2008   PCP:  Merryl Hacker, No Pharmacy:   [place], [place] - [place] [address] [place] [address] Phone: [phone] Fax: [phone]  [id number] [place], [place] - [address] [place] [address] Phone: [phone] Fax: [phone]     Social Determinants of Health (SDOH) Interventions    Readmission Risk Interventions Readmission Risk Prevention Plan 12/11/2020  Transportation Screening Complete  PCP or Specialist Appt within 5-7 Days Complete  Home Care Screening Complete  Some recent data might be hidden

## 2021-04-13 NOTE — Progress Notes (Addendum)
Walking O2 6L (tank only does 4 or 6 although 5 is baseline) Sitting 96 % Standing 94 % Walking 100 ft dropped to 87 % Sitting back to 91%

## 2021-04-13 NOTE — Progress Notes (Signed)
  Echocardiogram 2D Echocardiogram has been performed.  Corey Tyler 04/13/2021, 2:39 PM

## 2021-04-14 NOTE — Care Management (Signed)
04/14/2021 at [place] and note faxed to the [facility] oxygen department. [place] states the orders have to go through them first: [phone]. CM has also left a message for Lesly Rubenstein his CSW at the [place].

## 2021-04-15 NOTE — Discharge Summary (Addendum)
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 PCP: Follow-up with PCP in 1 week   Follow-up [facility], [place]. Schedule an appointment as soon as possible for a visit in 1 week(s).   Contact information: [place] [address] [phone]        Bethena Roys, [place]. Schedule an appointment as soon as possible for a visit in 1 week(s).   Specialty: Emergency Medicine Why: discuss continuation and dose adjustment of wellbutrin. discuss resuming prior toprol dose if needed. (toprol dose is reduced in reponse of addition of wellbutrin) Contact information: [place] [address] [phone]        Deboraha Sprang, MD. Schedule an appointment as soon as possible for a visit in 1 month(s).   Specialty: Cardiology Contact information: [address] [place] [address] [phone]              Discharge Instructions    Diet - low sodium heart healthy   Complete by: As directed    Increase activity slowly   Complete by: As directed       Diet recommendation: Cardiac diet  Activity: The patient is advised to gradually reintroduce usual activities, as tolerated  Discharge Condition: stable  Code Status: Full code   History of present illness: As per the H and P dictated on admission, "Corey Tyler is a 80 y.o. male with medical history significant of COPD Gold stage II, chronic O2 dependent hypoxic  renal failure on 3.5 liters around-the-clock, PAF, chronic systolic CHF LVEF 30 to 62% status post AICD, CKD stage II, hypothyroidism, BPH, recent COVID infection in late February 2022, presented with increasing shortness of breath and wheezing.  Patient received a booster dose of COVID-vaccine last week, since then she developed new wheezing especially at night, and a dry cough.  And slowly she developed shortness of breath, he tried to turn up his oxygen for the last few days with minimum help.  Denies any fever chills, no chest pains.  No leg swelling.  Family called EMS today and EMS arrived found patient O2 saturation 77% on 4 L.  ED Course: Patient was given IV Solu-Medrol and breathing treatment in the ED, x-ray showed possible atypical pneumonia."  Hospital Course:  Summary of his active problems in the hospital is as following.   1.  Acute on chronic hypoxic respiratory failure, POA, COPD exacerbation from bronchitis Initially on IV Solu-Medrol.    Transition to p.o. prednisone with a taper. Also on doxycycline. Continue nebulization therapy. Oxygenation improving.  Currently on 4.5 L of oxygen at rest.  On exertion requires 6 LPM.  2.  Paroxysmal A. fib Currently sinus rhythm. Continue amiodarone, Eliquis. Outpatient follow-up with cardiology to consider amiodarone toxicity as a cause of worsening hypoxia. Given that Wellbutrin can increase the effect of beta-blockers we will reduce the dose of metoprolol.  3.  Chronic systolic CHF Appears euvolemic. Currently on home dose of diuretic. Stable.  4.  Hypothyroidism Continue Synthroid  5.  Chronic kidney disease stage II BPH Continue Flomax. Appears euvolemic. Currently on torsemide. Mild worsening of his renal function.  Monitor.  6.  Anxiety Depression Patient requesting medication to assist with depression.  Has symptoms of mild depression.  No suicidal ideation. Given his need for amiodarone, we will  initiate Wellbutrin which has a better safety profile for QT prolongation. As Wellbutrin can modify beta-blocker effect we will reduce the dose of metoprolol.  7.  Insomnia. Requesting something to help with sleep. Tells me that the trazodone and melatonin are not effective.  Patient was seen by physical therapy, who recommended No therapy needed on discharge,. On the day of the discharge the patient's vitals were stable, and no other new acute medical condition were reported. The patient was felt safe to be discharge at Home with no therapy needed on discharge.  Consultants: none Procedures: none  DISCHARGE MEDICATION: Allergies as of 04/13/2021      Reactions   Atorvastatin Shortness Of Breath, Cough   Statins Shortness Of Breath, Other (See Comments)   Muscle and joing pain and "These bother my lungs and make my gait unsteady, also"   Xarelto [rivaroxaban] Other (See Comments)   Bleeding   Adhesive [tape] Hives, Itching, Rash   Codeine Nausea Only   Isosorbide Nitrate Other (See Comments)   Made him feel bad   Latex Itching, Other (See Comments)   Rash, itching, burning   Levofloxacin Other (See Comments)   Tachycardia   Lisinopril Cough   Lorazepam Other (See Comments)   "felt bad"   Mexiletine Other (See Comments)   GI distress   Sertraline Other (See Comments)   Made him feel bad      Medication List    STOP taking these medications   methylPREDNISolone 4 MG Tbpk tablet Commonly known as: MEDROL DOSEPAK     TAKE these medications   acetaminophen 500 MG tablet Commonly known as: TYLENOL Take 500 mg by mouth every 6 (six) hours as needed for headache (pain).   albuterol 108 (90 Base) MCG/ACT inhaler Commonly known as: VENTOLIN HFA Inhale 2 puffs into the lungs every 6 (six) hours as needed for wheezing or shortness of breath.   albuterol (2.5 MG/3ML) 0.083% nebulizer solution Commonly known as: PROVENTIL Take 2.5 mg by nebulization every 6 (six) hours as  needed for wheezing or shortness of breath.   amiodarone 200 MG tablet Commonly known as: PACERONE Take 1 tablet (200 mg total) by mouth 2 (two) times daily. What changed: when to take this   budesonide-formoterol 160-4.5 MCG/ACT inhaler Commonly known as: SYMBICORT Inhale 2 puffs into the lungs 2 (two) times daily.   buPROPion 150 MG 24 hr tablet Commonly known as: Wellbutrin XL Take 1 tablet (150 mg total) by mouth every morning.   cholecalciferol 1000 units tablet Commonly known as: VITAMIN D Take 1,000 Units by mouth daily.   doxycycline 100 MG tablet Commonly known as: VIBRA-TABS Take 1 tablet (100 mg total) by mouth 2 (two) times daily for 2 days.   Eliquis 5 MG Tabs tablet Generic drug: apixaban TAKE 1 TABLET BY MOUTH TWICE A DAY What changed: how much to take   finasteride 5 MG tablet Commonly known as: PROSCAR Take 5 mg by mouth daily.   Fish Oil 1000 MG Cpdr Take 1,000 mg by mouth daily.   guaiFENesin 600 MG 12 hr tablet Commonly known as: MUCINEX Take 2 tablets (1,200 mg total) by mouth 2 (two) times daily.   levothyroxine 125  MCG tablet Commonly known as: SYNTHROID Take 125 mcg by mouth daily before breakfast.   LUBRICATING EYE DROPS OP Place 1 drop into both eyes daily as needed (dry eyes).   MAGNESIUM PO Take 420 mg by mouth at bedtime.   melatonin 5 MG Tabs Take 1 tablet (5 mg total) by mouth at bedtime as needed (sleep). What changed:   medication strength  how much to take   METAMUCIL PO Take 15 g by mouth daily. Mix in liquid and drink   metoprolol succinate 25 MG 24 hr tablet Commonly known as: Toprol XL Take 0.5 tablets (12.5 mg total) by mouth at bedtime. What changed:   how much to take  how to take this  when to take this  additional instructions   multivitamin with minerals tablet Take 1 tablet by mouth daily. Mens 50 plus   nitroGLYCERIN 0.4 MG SL tablet Commonly known as: NITROSTAT Place 0.4 mg under the tongue  every 5 (five) minutes x 3 doses as needed for chest pain.   OXYGEN Inhale 4 L into the lungs See admin instructions.   potassium chloride 10 MEQ CR capsule Commonly known as: MICRO-K Take 10 mEq by mouth at bedtime.   predniSONE 10 MG tablet Commonly known as: DELTASONE Take 40mg  daily for 3days,Take 30mg  daily for 3days,Take 20mg  daily for 3days,Take 10mg  daily for 3days, then stop   promethazine 25 MG tablet Commonly known as: PHENERGAN Take 25 mg by mouth daily as needed for nausea or vomiting.   ranolazine 1000 MG SR tablet Commonly known as: RANEXA Take 1 tablet (1,000 mg total) by mouth 2 (two) times daily.   sodium chloride 0.65 % Soln nasal spray Commonly known as: OCEAN Place 1 spray into both nostrils daily as needed for congestion.   Spiriva Respimat 2.5 MCG/ACT Aers Generic drug: Tiotropium Bromide Monohydrate Inhale 2 puffs into the lungs daily as needed (sob).   tamsulosin 0.4 MG Caps capsule Commonly known as: FLOMAX Take 0.4 mg by mouth at bedtime.   torsemide 20 MG tablet Commonly known as: DEMADEX Take 1 tablet (20 mg total) by mouth daily. Take 1 tablet by mouth daily and 1 1/2 tablet on  Wed Sat Sun and What changed:   when to take this  additional instructions   traZODone 50 MG tablet Commonly known as: DESYREL Take 50 mg by mouth at bedtime as needed for sleep.       Discharge Exam: Filed Weights   04/11/21 0232 04/12/21 0309 04/13/21 0405  Weight: 93.1 kg 94.5 kg 94.3 kg   Vitals:   04/13/21 0810 04/13/21 1332  BP:  123/64  Pulse:  69  Resp:  20  Temp:  98.3 F (36.8 C)  SpO2: 92% 92%   General: Appear in mild distress, no Rash; Oral Mucosa Clear, moist. no Abnormal Neck Mass Or lumps, Conjunctiva normal  Cardiovascular: S1 and S2 Present, no Murmur, Respiratory: good respiratory effort, Bilateral Air entry present and CTA, no Crackles, no wheezes Abdomen: Bowel Sound present, Soft and no tenderness Extremities: no Pedal  edema Neurology: alert and oriented to time, place, and person affect appropriate. no new focal deficit Gait not checked due to patient safety concerns  The results of significant diagnostics from this hospitalization (including imaging, microbiology, ancillary and laboratory) are listed below for reference.    Significant Diagnostic Studies: DG Chest 2 View  Result Date: 04/10/2021 CLINICAL DATA:  80 year old male with history of decreased oxygen saturations and shortness of breath. EXAM:  CHEST - 2 VIEW COMPARISON:  Chest x-ray 01/09/2021. FINDINGS: Lung volumes are low. Widespread areas of interstitial prominence and patchy airspace disease noted throughout the lungs bilaterally, most evident throughout the mid to lower lungs. Probable trace bilateral pleural effusions. No pneumothorax. No evidence of pulmonary edema. Heart size is normal. Upper mediastinal contours are within normal limits. Left-sided pacemaker/AICD in place with lead tips projecting over the expected location of the right atrium and right ventricle. IMPRESSION: 1. The appearance of the chest is concerning for multilobar bilateral pneumonia. Clinical correlation for signs and symptoms of viral infection is recommended. 2. Trace bilateral pleural effusions. 3. Aortic atherosclerosis. Electronically Signed   By: Vinnie Langton M.D.   On: 04/10/2021 10:01   ECHOCARDIOGRAM LIMITED  Result Date: 04/13/2021    ECHOCARDIOGRAM LIMITED REPORT   Patient Name:   Corey Tyler Date of Exam: 04/13/2021 Medical Rec #:  [id number]      Height:       73.0 in Accession #:    [id number]     Weight:       207.8 lb Date of Birth:  March 21, 1941      BSA:          2.187 m Patient Age:    71 years       BP:           123/64 mmHg Patient Gender: M              HR:           68 bpm. Exam Location:  Inpatient Procedure: Limited Echo, Limited Color Doppler and Cardiac Doppler Indications:    chest pain  History:        Patient has prior history of  Echocardiogram examinations, most                 recent 12/10/2020. Defibrillator and Pacemaker, chronic kidney                 disease and COPD, Arrythmias:PVC and Atrial Fibrillation; Risk                 Factors:Dyslipidemia.  Sonographer:    Johny Chess Referring Phys: [id number] [place]  1. Left ventricular ejection fraction, by estimation, is 60 to 65%. The left ventricle has normal function. The left ventricle has no regional wall motion abnormalities. Left ventricular diastolic parameters are consistent with Grade I diastolic dysfunction (impaired relaxation).  2. Right ventricular systolic function is normal. The right ventricular size is normal. There is moderately elevated pulmonary artery systolic pressure.  3. Eccentric posterior MR jet, eccentricity makes quantifying difficult. . Mild to moderate mitral valve regurgitation. No evidence of mitral stenosis.  4. The aortic valve has an indeterminant number of cusps. Aortic valve regurgitation is mild. No aortic stenosis is present.  5. Moderate pulmonary HTN, PASP is 50 mmHg.  6. The inferior vena cava is normal in size with greater than 50% respiratory variability, suggesting right atrial pressure of 3 mmHg. FINDINGS  Left Ventricle: Left ventricular ejection fraction, by estimation, is 60 to 65%. The left ventricle has normal function. The left ventricle has no regional wall motion abnormalities. The left ventricular internal cavity size was normal in size. Left ventricular diastolic parameters are consistent with Grade I diastolic dysfunction (impaired relaxation). Right Ventricle: The right ventricular size is normal. No increase in right ventricular wall thickness. Right ventricular systolic function is normal. There is moderately elevated pulmonary artery systolic pressure. The  tricuspid regurgitant velocity is 3.42 m/s, and with an assumed right atrial pressure of 3 mmHg, the estimated right ventricular systolic pressure is  03.4 mmHg. Pericardium: There is no evidence of pericardial effusion. Mitral Valve: Eccentric posterior MR jet, eccentricity makes quantifying difficult. Mild to moderate mitral valve regurgitation. No evidence of mitral valve stenosis. Tricuspid Valve: The tricuspid valve is normal in structure. Tricuspid valve regurgitation is mild . No evidence of tricuspid stenosis. Aortic Valve: The aortic valve has an indeterminant number of cusps. Aortic valve regurgitation is mild. No aortic stenosis is present. Pulmonic Valve: The pulmonic valve was not well visualized. Pulmonic valve regurgitation is not visualized. No evidence of pulmonic stenosis. Pulmonary Artery: Moderate pulmonary HTN, PASP is 50 mmHg. Venous: The inferior vena cava is normal in size with greater than 50% respiratory variability, suggesting right atrial pressure of 3 mmHg. LEFT VENTRICLE PLAX 2D LVIDd:         5.60 cm LVIDs:         4.30 cm LV PW:         1.00 cm LV IVS:        1.00 cm LVOT diam:     2.30 cm LV SV:         68 LV SV Index:   31 LVOT Area:     4.15 cm  IVC IVC diam: 1.70 cm LEFT ATRIUM         Index LA diam:    4.30 cm 1.97 cm/m  AORTIC VALVE LVOT Vmax:   82.60 cm/s LVOT Vmean:  55.100 cm/s LVOT VTI:    0.164 m  AORTA Ao Asc diam: 3.30 cm MITRAL VALVE                TRICUSPID VALVE MV Area (PHT): 3.65 cm     TR Peak grad:   46.8 mmHg MV Decel Time: 208 msec     TR Vmax:        342.00 cm/s MV E velocity: 64.30 cm/s MV A velocity: 110.00 cm/s  SHUNTS MV E/A ratio:  0.58         Systemic VTI:  0.16 m                             Systemic Diam: 2.30 cm Carlyle Dolly MD Electronically signed by Carlyle Dolly MD Signature Date/Time: 04/13/2021/3:12:37 PM    Final     Microbiology: Recent Results (from the past 240 hour(s))  Resp Panel by RT-PCR (Flu A&B, Covid) Nasopharyngeal Swab     Status: None   Collection Time: 04/10/21  2:07 PM   Specimen: Nasopharyngeal Swab; Nasopharyngeal(NP) swabs in vial transport medium  Result Value  Ref Range Status   SARS Coronavirus 2 by RT PCR NEGATIVE NEGATIVE Final    Comment: (NOTE) SARS-CoV-2 target nucleic acids are NOT DETECTED.  The SARS-CoV-2 RNA is generally detectable in upper respiratory specimens during the acute phase of infection. The lowest concentration of SARS-CoV-2 viral copies this assay can detect is 138 copies/mL. A negative result does not preclude SARS-Cov-2 infection and should not be used as the sole basis for treatment or other patient management decisions. A negative result may occur with  improper specimen collection/handling, submission of specimen other than nasopharyngeal swab, presence of viral mutation(s) within the areas targeted by this assay, and inadequate number of viral copies(<138 copies/mL). A negative result must be combined with clinical observations, patient history, and epidemiological information. The  expected result is Negative.  Fact Sheet for Patients:  EntrepreneurPulse.com.au  Fact Sheet for Healthcare Providers:  IncredibleEmployment.be  This test is no t yet approved or cleared by the [place] FDA and  has been authorized for detection and/or diagnosis of SARS-CoV-2 by FDA under an Emergency Use Authorization (EUA). This EUA will remain  in effect (meaning this test can be used) for the duration of the COVID-19 declaration under Section 564(b)(1) of the Act, 21 U.S.C.section 360bbb-3(b)(1), unless the authorization is terminated  or revoked sooner.       Influenza A by PCR NEGATIVE NEGATIVE Final   Influenza B by PCR NEGATIVE NEGATIVE Final    Comment: (NOTE) The Xpert Xpress SARS-CoV-2/FLU/RSV plus assay is intended as an aid in the diagnosis of influenza from Nasopharyngeal swab specimens and should not be used as a sole basis for treatment. Nasal washings and aspirates are unacceptable for Xpert Xpress SARS-CoV-2/FLU/RSV testing.  Fact Sheet for  Patients: EntrepreneurPulse.com.au  Fact Sheet for Healthcare Providers: IncredibleEmployment.be  This test is not yet approved or cleared by the [place] FDA and has been authorized for detection and/or diagnosis of SARS-CoV-2 by FDA under an Emergency Use Authorization (EUA). This EUA will remain in effect (meaning this test can be used) for the duration of the COVID-19 declaration under Section 564(b)(1) of the Act, 21 U.S.C. section 360bbb-3(b)(1), unless the authorization is terminated or revoked.  Performed at [facility], [place] [address], [place], [place] [address]      Labs: CBC: Recent Labs  Lab 04/10/21 0925  WBC 10.9*  HGB 11.9*  HCT 37.7*  MCV 97.9  PLT 641   Basic Metabolic Panel: Recent Labs  Lab 04/10/21 0925 04/11/21 0451 04/12/21 0234 04/13/21 0328  NA 131* 134* 135 135  K 4.1 3.8 4.1 4.5  CL 98 101 99 99  CO2 28 27 26 29   GLUCOSE 119* 168* 166* 117*  BUN 24* 28* 40* 42*  CREATININE 1.56* 1.38* 1.76* 1.56*  CALCIUM 8.1* 8.6* 8.9 8.5*   Liver Function Tests: Recent Labs  Lab 04/10/21 1614  AST 21  ALT 28  ALKPHOS 57  BILITOT 0.8  PROT 6.3*  ALBUMIN 2.6*   CBG: Recent Labs  Lab 04/10/21 1143  GLUCAP 135*    Time spent: 35 minutes  Signed:  Berle Mull  [facility] 04/13/2021 11:56 PM

## 2021-04-22 ENCOUNTER — Ambulatory Visit (INDEPENDENT_AMBULATORY_CARE_PROVIDER_SITE_OTHER): Payer: PPO

## 2021-04-22 DIAGNOSIS — I5022 Chronic systolic (congestive) heart failure: Secondary | ICD-10-CM | POA: Diagnosis not present

## 2021-04-22 LAB — CUP PACEART REMOTE DEVICE CHECK
Battery Remaining Longevity: 101 mo
Battery Remaining Percentage: 95.5 %
Battery Voltage: 3.05 V
Brady Statistic AP VP Percent: 1 %
Brady Statistic AP VS Percent: 52 %
Brady Statistic AS VP Percent: 1 %
Brady Statistic AS VS Percent: 47 %
Brady Statistic RA Percent Paced: 51 %
Brady Statistic RV Percent Paced: 1 %
Date Time Interrogation Session: 20220531030358
HighPow Impedance: 78 Ohm
Implantable Lead Implant Date: 20130213
Implantable Lead Implant Date: 20130213
Implantable Lead Location: 753859
Implantable Lead Location: 753860
Implantable Pulse Generator Implant Date: 20220228
Lead Channel Impedance Value: 390 Ohm
Lead Channel Impedance Value: 410 Ohm
Lead Channel Pacing Threshold Amplitude: 0.5 V
Lead Channel Pacing Threshold Amplitude: 1 V
Lead Channel Pacing Threshold Pulse Width: 0.5 ms
Lead Channel Pacing Threshold Pulse Width: 0.6 ms
Lead Channel Sensing Intrinsic Amplitude: 11.5 mV
Lead Channel Sensing Intrinsic Amplitude: 2.7 mV
Lead Channel Setting Pacing Amplitude: 1.5 V
Lead Channel Setting Pacing Amplitude: 2.5 V
Lead Channel Setting Pacing Pulse Width: 0.5 ms
Lead Channel Setting Sensing Sensitivity: 0.5 mV
Pulse Gen Serial Number: 111038341

## 2021-05-09 ENCOUNTER — Encounter: Payer: PPO | Admitting: Internal Medicine

## 2021-05-11 NOTE — Progress Notes (Deleted)
Cardiology Office Note   Date:  05/11/2021   ID:  Praise, Dolecki 1941-09-19, MRN [id number]  PCP:  [facility]  Cardiologist:   Minus Breeding, MD   No chief complaint on file.     History of Present Illness: Corey Tyler is a 80 y.o. male who presents for follow up of atrial fib and chronic systolic HF.  He had tremor and was very weak.  He was having follow up with a neurologist.  He has required reduced meds secondary to orthostatic hypotension and weakness.   EF in 2018 was 45 - 50%.  He was in the hospital in October 2020 .  There were no cardiac complications noted and no evidence of heart failure.  He was in sinus rhythm.  This was a small bowel obstruction.  I do note that his ejection fraction is down to 25 to 30% on echo in September.  We tried to treat him with Delene Loll but he really did not tolerate this with low blood pressures.    He was in the hospital in January with syncopal episode.  There was a question of whether he was in ventricular tachycardia but there was no evidence of this and none was detected on device but if it was thought this could have been below the detection rate.  He did have his generator changed out.  There was a suggestion that he might start mexiletine but he was doing well so this was not started.   He did not want to start this as he felt good.  He was in the hospital with COPD exacerbation.  I reviewed these records for this visit.    He had PAF.    ***  ***  He has since had episodes of weakness but he just does not sleep for a few hours every night.  He gets around with his rolling walker and is on oxygen continuously.  He has had no palpitations.  Has had no presyncope or syncope.  He denies any chest pressure, neck or arm discomfort.  He has had some mild increased lower extremity swelling and has taken some extra Lasix.   Past Medical History:  Diagnosis Date   A-fib [facility])    a. Noted on 12/2012 interrogation, placed on  Apixaban and ultimately stopped NOACs 2/2 personal decision 8/14.   AICD (automatic cardioverter/defibrillator) present    Cardiac defibrillator -dual  [employer]   Arthritis    Atrial tachycardia-non sustained    a. Noted on 03/2012 interrogation.   Chronic systolic heart failure (HCC)    EF down to 20 to 25% per echo November 2012   COPD (chronic obstructive pulmonary disease) ([place])    Hyperlipidemia    Hypertension    ICD (implantable cardiac defibrillator) in place    NICM (nonischemic cardiomyopathy) ([place])    a. Normal cors 2000. b. Minimal plaque 2013.   Old myocardial infarction    a. ?Silent MI. b. Large fixed inferolateral defect c/w with prior infarct, no ischemia. c. Cath in 2000/2013 with only minimal CAD.   Presence of permanent cardiac pacemaker    Pulmonary nodule, right    Last scan in 2010 showing stability; felt to be benign.   PVC's (premature ventricular contractions)    Ventricular tachycardia, polymorphic ([place])    Rx via ICD 12/14    Past Surgical History:  Procedure Laterality Date   [place]  DEFIBRILLATOR PLACEMENT     CARDIOVERSION  03/30/2013   Procedure: CARDIOVERSION;  Surgeon: [facility] Head[facility], MD;  Location: [place];  Service: Cardiovascular;;   COLON SURGERY     COLONOSCOPY N/A 09/25/2013   Procedure: COLONOSCOPY;  Surgeon: Jerene Bears, MD;  Location[facility];  Service: Endoscopy;  Laterality: N/A;   ICD  12/2011   [name]   ICD GENERATOR CHANGEOUT N/A 01/20/2021   Procedure: ICD GENERATOR CHANGEOUT;  Surgeon: Deboraha Sprang, MD;  Location: [place] CV LAB;  Service: Cardiovascular;  Laterality: N/A;   IMPLANTABLE CARDIOVERTER DEFIBRILLATOR IMPLANT N/A 01/06/2012   Procedure: IMPLANTABLE CARDIOVERTER DEFIBRILLATOR IMPLANT;  Surgeon: Deboraha Sprang, MD;  Location: [facility] CATH LAB;  Service: Cardiovascular;  Laterality: N/A;   PACEMAKER IMPLANT     [name]   TEE WITHOUT CARDIOVERSION N/A 03/30/2013    Procedure: TRANSESOPHAGEAL ECHOCARDIOGRAM (TEE);  Surgeon: [facility], MD;  Location: [place];  Service: Cardiovascular;  Laterality: N/A;   V TACH ABLATION  06/10/2017   V TACH ABLATION N/A 06/10/2017   Procedure: Stephanie Coup Ablation;  Surgeon: Evans Lance, MD;  Location: [place] CV LAB;  Service: Cardiovascular;  Laterality: N/A;     Current Outpatient Medications  Medication Sig Dispense Refill   acetaminophen (TYLENOL) 500 MG tablet Take 500 mg by mouth every 6 (six) hours as needed for headache (pain).     albuterol (PROVENTIL) (2.5 MG/3ML) 0.083% nebulizer solution Take 2.5 mg by nebulization every 6 (six) hours as needed for wheezing or shortness of breath.     albuterol (VENTOLIN HFA) 108 (90 Base) MCG/ACT inhaler Inhale 2 puffs into the lungs every 6 (six) hours as needed for wheezing or shortness of breath.     amiodarone (PACERONE) 200 MG tablet Take 1 tablet (200 mg total) by mouth 2 (two) times daily. (Patient taking differently: Take 200 mg by mouth daily.) 60 tablet 0   budesonide-formoterol (SYMBICORT) 160-4.5 MCG/ACT inhaler Inhale 2 puffs into the lungs 2 (two) times daily.     buPROPion (WELLBUTRIN XL) 150 MG 24 hr tablet Take 1 tablet (150 mg total) by mouth every morning. 30 tablet 0   Carboxymethylcellul-Glycerin (LUBRICATING EYE DROPS OP) Place 1 drop into both eyes daily as needed (dry eyes).     cholecalciferol (VITAMIN D) 1000 UNITS tablet Take 1,000 Units by mouth daily.     ELIQUIS 5 MG TABS tablet TAKE 1 TABLET BY MOUTH TWICE A DAY (Patient taking differently: Take 5 mg by mouth 2 (two) times daily.) 60 tablet 5   finasteride (PROSCAR) 5 MG tablet Take 5 mg by mouth daily.      guaiFENesin (MUCINEX) 600 MG 12 hr tablet Take 2 tablets (1,200 mg total) by mouth 2 (two) times daily. 30 tablet 0   levothyroxine (SYNTHROID, LEVOTHROID) 125 MCG tablet Take 125 mcg by mouth daily before breakfast.      MAGNESIUM PO Take 420 mg by mouth at bedtime.      melatonin 5 MG TABS Take 1 tablet (5 mg total) by mouth at bedtime as needed (sleep). 30 tablet 0   metoprolol succinate (TOPROL XL) 25 MG 24 hr tablet Take 0.5 tablets (12.5 mg total) by mouth at bedtime. 30 tablet 0   Multiple Vitamins-Minerals (MULTIVITAMIN WITH MINERALS) tablet Take 1 tablet by mouth daily. Mens 50 plus     nitroGLYCERIN (NITROSTAT) 0.4 MG SL tablet Place 0.4 mg under the tongue every 5 (five) minutes x 3 doses as needed for  chest pain.     Omega-3 Fatty Acids (FISH OIL) 1000 MG CPDR Take 1,000 mg by mouth daily.     OXYGEN Inhale 4 L into the lungs See admin instructions.     potassium chloride (MICRO-K) 10 MEQ CR capsule Take 10 mEq by mouth at bedtime.     predniSONE (DELTASONE) 10 MG tablet Take 40mg  daily for 3days,Take 30mg  daily for 3days,Take 20mg  daily for 3days,Take 10mg  daily for 3days, then stop 30 tablet 0   promethazine (PHENERGAN) 25 MG tablet Take 25 mg by mouth daily as needed for nausea or vomiting.      Psyllium (METAMUCIL PO) Take 15 g by mouth daily. Mix in liquid and drink     ranolazine (RANEXA) 1000 MG SR tablet Take 1 tablet (1,000 mg total) by mouth 2 (two) times daily. 180 tablet 3   sodium chloride (OCEAN) 0.65 % SOLN nasal spray Place 1 spray into both nostrils daily as needed for congestion.     tamsulosin (FLOMAX) 0.4 MG CAPS capsule Take 0.4 mg by mouth at bedtime.     Tiotropium Bromide Monohydrate (SPIRIVA RESPIMAT) 2.5 MCG/ACT AERS Inhale 2 puffs into the lungs daily as needed (sob).     torsemide (DEMADEX) 20 MG tablet Take 1 tablet (20 mg total) by mouth daily. Take 1 tablet by mouth daily and 1 1/2 tablet on  Wed Sat Sun and (Patient taking differently: Take 20 mg by mouth See admin instructions. Take 1 tablet (20 mg) by mouth daily on Mondays, Tuesdays, Thursdays, & Fridays. Take 1.5 tablets (30 mg) by mouth daily on Wednesdays, Saturdays, & Sundays.) 135 tablet 1   traZODone (DESYREL) 50 MG tablet Take 50 mg by mouth at bedtime as needed  for sleep.     No current facility-administered medications for this visit.    Allergies:   Atorvastatin, Statins, Xarelto [rivaroxaban], Adhesive [tape], Codeine, Isosorbide nitrate, Latex, Levofloxacin, Lisinopril, Lorazepam, Mexiletine, and Sertraline    ROS:  Please see the history of present illness.   Otherwise, review of systems are positive for ***.   All other systems are reviewed and negative.    PHYSICAL EXAM: VS:  There were no vitals taken for this visit. , BMI There is no height or weight on file to calculate BMI. GENERAL:  Well appearing NECK:  No jugular venous distention, waveform within normal limits, carotid upstroke brisk and symmetric, no bruits, no thyromegaly LUNGS:  Clear to auscultation bilaterally CHEST:  Unremarkable HEART:  PMI not displaced or sustained,S1 and S2 within normal limits, no S3, no S4, no clicks, no rubs, *** murmurs ABD:  Flat, positive bowel sounds normal in frequency in pitch, no bruits, no rebound, no guarding, no midline pulsatile mass, no hepatomegaly, no splenomegaly EXT:  2 plus pulses throughout, no edema, no cyanosis no clubbing     ***GEN:  No distress NECK:  No jugular venous distention at 90 degrees, waveform within normal limits, carotid upstroke brisk and symmetric, no bruits, no thyromegaly LYMPHATICS:  No cervical adenopathy LUNGS:  Clear to auscultation bilaterally BACK:  No CVA tenderness CHEST:  Unremarkable HEART:  S1 and S2 within normal limits, no S3, no S4, no clicks, no rubs, no murmurs, distant heart sounds ABD:  Positive bowel sounds normal in frequency in pitch, no bruits, no rebound, no guarding, unable to assess midline mass or bruit with the patient seated. EXT:  2 plus pulses throughout, right lower greater than left leg edema, no cyanosis no clubbing SKIN:  No  rashes no nodules NEURO:  Cranial nerves II through XII grossly intact, motor grossly intact throughout PSYCH:  Cognitively intact, oriented to person  place and time   Recent Labs: 12/09/2020: TSH 0.833 12/11/2020: B Natriuretic Peptide 129.8; Magnesium 2.2 04/10/2021: ALT 28; Hemoglobin 11.9; Platelets 187 04/13/2021: BUN 42; Creatinine, Ser 1.56; Potassium 4.5; Sodium 135     Lab Results  Component Value Date   TSH 0.833 12/09/2020   ALT 28 04/10/2021   AST 21 04/10/2021   ALKPHOS 57 04/10/2021   BILITOT 0.8 04/10/2021   PROT 6.3 (L) 04/10/2021   ALBUMIN 2.6 (L) 04/10/2021   EKG: Sinus rhythm, rate *** axis within normal limits, intervals within normal limits, no acute ST-T wave changes.  Wt Readings from Last 3 Encounters:  04/13/21 207 lb 12.8 oz (94.3 kg)  02/14/21 213 lb 12.8 oz (97 kg)  01/20/21 195 lb (88.5 kg)      Other studies Reviewed: Additional studies/ records that were reviewed today include: ***  Review of the above records demonstrates:   See elsewhere   ASSESSMENT AND PLAN:  Cardiomyopathy - The patient's ejection fraction is 30 - 35% at the last echo.  ***  He has not tolerated further med titration.  No change in therapy.  He does have some mild leg edema and will continue with as needed dosing of his diuretic as he is doing.  Atrial fibrillation - Corey Tyler has a CHA2DS2 - VASc score of 5.   *** He tolerates anticoagulation.  No change in therapy.  Of note his creatinine is mildly elevated at 1.56 but it is typically been 1.2 and so I will not change therapy.  VT - This is managed as above.  ***He has had no further symptoms.  I will defer further management to Dr. [name] at all.   Hypertension -  This is being managed in the context of treating his CHF .  ***  he does not tolerate med titration.  No change in therapy.  CAD - ***  He is having no active chest pain.  No change in therapy.   CKD III Creatinine is *** 1.56.  We will continue the meds as listed.   AAA This was ***  3.2 cm in 2020 and he needs ultrasound in October.   We will arrange follow-up in October.    Current  medicines are reviewed at length with the patient today.  The patient does not have concerns regarding medicines.  The following changes have been made:   None  Labs/ tests ordered today include:  None  No orders of the defined types were placed in this encounter.    Disposition:   FU with me in 6 months.    Signed, Minus Breeding, MD  05/11/2021 9:59 PM    [facility]

## 2021-05-12 ENCOUNTER — Ambulatory Visit: Payer: PPO | Admitting: Cardiology

## 2021-05-14 NOTE — Progress Notes (Signed)
Cardiology Office Note   Date:  05/15/2021   ID:  Amyr, Sluder [birth year], MRN [id number]  PCP[facility]  [facility]  Cardiologist:   Minus Breeding, MD   Chief Complaint  Patient presents with   Dizziness       History of Present Illness: Corey Tyler is a 80 y.o. male who presents for follow up of atrial fib and chronic systolic HF.  He had tremor and was very weak.  He was having follow up with a neurologist.  He has required reduced meds secondary to orthostatic hypotension and weakness.   EF in 2018 was 45 - 50%.  He was in the hospital in October 2020 .  There were no cardiac complications noted and no evidence of heart failure.  He was in sinus rhythm.  This was a small bowel obstruction.  I do note that his ejection fraction is down to 25 to 30% on echo in September.  We tried to treat him with Delene Loll but he really did not tolerate this with low blood pressures.    He was in the hospital in January with syncopal episode.  There was a question of whether he was in ventricular tachycardia but there was no evidence of this and none was detected on device but if it was thought this could have been below the detection rate.  He did have his generator changed out.  There was a suggestion that he might start mexiletine but he was doing well so this was not started.   He did not want to start this as he felt good.  He was in the hospital with COPD exacerbation.  I reviewed these records for this visit.    He had PAF.    Very frail since this time.  He has had increased fatigue.  He left the hospital on 4 L of oxygen which increased.  He is getting very dizzy when he stands up to urinate.  He has not had any further syncope.  He is not feeling any palpitations.  He has had no chest pressure, neck or arm discomfort.  He said no increased swelling.  He says he does have decreased amount of urine with some dark urine.  He has not had any new cough fevers or chills.  Of note he  felt poorly so he actually started taking a full dose of his metoprolol instead of the half dose that he was taking.  Past Medical History:  Diagnosis Date   A-fib [facility])    a. Noted on 12/2012 interrogation, placed on Apixaban and ultimately stopped NOACs 2/2 personal decision 8/14.   AICD (automatic cardioverter/defibrillator) present    Cardiac defibrillator -dual  [employer]   Arthritis    Atrial tachycardia-non sustained    a. Noted on 03/2012 interrogation.   Chronic systolic heart failure (HCC)    EF down to 20 to 25% per echo November 2012   COPD (chronic obstructive pulmonary disease) ([place])    Hyperlipidemia    Hypertension    ICD (implantable cardiac defibrillator) in place    NICM (nonischemic cardiomyopathy) ([place])    a. Normal cors 2000. b. Minimal plaque 2013.   Old myocardial infarction    a. ?Silent MI. b. Large fixed inferolateral defect c/w with prior infarct, no ischemia. c. Cath in 2000/2013 with only minimal CAD.   Presence of permanent cardiac pacemaker    Pulmonary nodule, right    Last scan in 2010 showing stability;  felt to be benign.   PVC's (premature ventricular contractions)    Ventricular tachycardia, polymorphic ([place])    Rx via ICD 12/14    Past Surgical History:  Procedure Laterality Date   [place]  03/30/2013   Procedure: CARDIOVERSION;  Surgeon: [facility], MD;  Location: [place];  Service: Cardiovascular;;   COLON SURGERY     COLONOSCOPY N/A 09/25/2013   Procedure: COLONOSCOPY;  Surgeon: Jerene Bears, MD;  [facility];  Service: Endoscopy;  Laterality: N/A;   ICD  12/2011   [name]   ICD GENERATOR CHANGEOUT N/A 01/20/2021   Procedure: ICD GENERATOR CHANGEOUT;  Surgeon: Deboraha Sprang, MD;  Location: [place] CV LAB;  Service: Cardiovascular;  Laterality: N/A;   IMPLANTABLE CARDIOVERTER DEFIBRILLATOR IMPLANT N/A 01/06/2012   Procedure:  IMPLANTABLE CARDIOVERTER DEFIBRILLATOR IMPLANT;  Surgeon: Deboraha Sprang, MD;  Location: [facility] CATH LAB;  Service: Cardiovascular;  Laterality: N/A;   PACEMAKER IMPLANT     [employer] [name]   TEE WITHOUT CARDIOVERSION N/A 03/30/2013   Procedure: TRANSESOPHAGEAL ECHOCARDIOGRAM (TEE);  Surgeon: [facility] Headings, MD;  Location: [place];  Service: Cardiovascular;  Laterality: N/A;   V TACH ABLATION  06/10/2017   V TACH ABLATION N/A 06/10/2017   Procedure: Stephanie Coup Ablation;  Surgeon: Evans Lance, MD;  Location: [place] CV LAB;  Service: Cardiovascular;  Laterality: N/A;     Current Outpatient Medications  Medication Sig Dispense Refill   acetaminophen (TYLENOL) 500 MG tablet Take 500 mg by mouth every 6 (six) hours as needed for headache (pain).     albuterol (PROVENTIL) (2.5 MG/3ML) 0.083% nebulizer solution Take 2.5 mg by nebulization every 6 (six) hours as needed for wheezing or shortness of breath.     albuterol (VENTOLIN HFA) 108 (90 Base) MCG/ACT inhaler Inhale 2 puffs into the lungs every 6 (six) hours as needed for wheezing or shortness of breath.     amiodarone (PACERONE) 200 MG tablet Take 1 tablet (200 mg total) by mouth 2 (two) times daily. (Patient taking differently: Take 200 mg by mouth daily.) 60 tablet 0   budesonide-formoterol (SYMBICORT) 160-4.5 MCG/ACT inhaler Inhale 2 puffs into the lungs 2 (two) times daily.     Carboxymethylcellul-Glycerin (LUBRICATING EYE DROPS OP) Place 1 drop into both eyes daily as needed (dry eyes).     cholecalciferol (VITAMIN D) 1000 UNITS tablet Take 1,000 Units by mouth daily.     ELIQUIS 5 MG TABS tablet TAKE 1 TABLET BY MOUTH TWICE A DAY 60 tablet 5   finasteride (PROSCAR) 5 MG tablet Take 5 mg by mouth daily.      guaiFENesin (MUCINEX) 600 MG 12 hr tablet Take 2 tablets (1,200 mg total) by mouth 2 (two) times daily. 30 tablet 0   levothyroxine (SYNTHROID, LEVOTHROID) 125 MCG tablet Take 125 mcg by mouth daily before breakfast.      MAGNESIUM PO Take  420 mg by mouth at bedtime.     melatonin 5 MG TABS Take 1 tablet (5 mg total) by mouth at bedtime as needed (sleep). 30 tablet 0   metoprolol succinate (TOPROL XL) 25 MG 24 hr tablet Take 0.5 tablets (12.5 mg total) by mouth at bedtime. (Patient taking differently: Take 12.5 mg by mouth at bedtime. Patient taking 1 tablet at night) 30 tablet 0   Multiple Vitamins-Minerals (MULTIVITAMIN WITH MINERALS) tablet Take 1 tablet by mouth daily.  Mens 50 plus     Omega-3 Fatty Acids (FISH OIL) 1000 MG CPDR Take 1,000 mg by mouth daily.     OXYGEN Inhale 4 L into the lungs See admin instructions.     potassium chloride (MICRO-K) 10 MEQ CR capsule Take 10 mEq by mouth at bedtime.     promethazine (PHENERGAN) 25 MG tablet Take 25 mg by mouth daily as needed for nausea or vomiting.      Psyllium (METAMUCIL PO) Take 15 g by mouth daily. Mix in liquid and drink     ranolazine (RANEXA) 1000 MG SR tablet Take 1 tablet (1,000 mg total) by mouth 2 (two) times daily. 180 tablet 3   sodium chloride (OCEAN) 0.65 % SOLN nasal spray Place 1 spray into both nostrils daily as needed for congestion.     tamsulosin (FLOMAX) 0.4 MG CAPS capsule Take 0.4 mg by mouth at bedtime.     Tiotropium Bromide Monohydrate (SPIRIVA RESPIMAT) 2.5 MCG/ACT AERS Inhale 2 puffs into the lungs daily as needed (sob).     nitroGLYCERIN (NITROSTAT) 0.4 MG SL tablet Place 0.4 mg under the tongue every 5 (five) minutes x 3 doses as needed for chest pain.     torsemide (DEMADEX) 20 MG tablet Take 1 tablet (20 mg total) by mouth daily. 135 tablet 1   No current facility-administered medications for this visit.    Allergies:   Atorvastatin, Statins, Xarelto [rivaroxaban], Adhesive [tape], Codeine, Isosorbide nitrate, Latex, Levofloxacin, Lisinopril, Lorazepam, Mexiletine, and Sertraline    ROS:  Please see the history of present illness.   Otherwise, review of systems are positive for none.   All other systems are reviewed and negative.     PHYSICAL EXAM: VS:  BP (!) 90/50 (BP Location: Left Arm, Patient Position: Sitting, Cuff Size: Normal)   Pulse 69   Ht 6\' 1"  (1.854 m)   Wt 205 lb (93 kg) Comment: patient refused to weigh in office  SpO2 90%   BMI 27.05 kg/m  , BMI Body mass index is 27.05 kg/m. GEN:  No distress, very frail appearing NECK:  No jugular venous distention at 90 degrees, waveform within normal limits, carotid upstroke brisk and symmetric, no bruits, no thyromegaly LYMPHATICS:  No cervical adenopathy LUNGS:  Clear to auscultation bilaterally BACK:  No CVA tenderness CHEST:  Unremarkable HEART:  S1 and S2 within normal limits, no S3, no S4, no clicks, no rubs, no murmurs ABD:  Positive bowel sounds normal in frequency in pitch, no bruits, no rebound, no guarding, unable to assess midline mass or bruit with the patient seated. EXT:  2 plus pulses throughout, mild edema, no cyanosis no clubbing SKIN:  No rashes no nodules, bruising NEURO:  Cranial nerves II through XII grossly intact, motor grossly intact throughout PSYCH:  Cognitively intact, oriented to person place and time   Recent Labs: 12/09/2020: TSH 0.833 12/11/2020: B Natriuretic Peptide 129.8; Magnesium 2.2 04/10/2021: ALT 28; Hemoglobin 11.9; Platelets 187 04/13/2021: BUN 42; Creatinine, Ser 1.56; Potassium 4.5; Sodium 135     Lab Results  Component Value Date   TSH 0.833 12/09/2020   ALT 28 04/10/2021   AST 21 04/10/2021   ALKPHOS 57 04/10/2021   BILITOT 0.8 04/10/2021   PROT 6.3 (L) 04/10/2021   ALBUMIN 2.6 (L) 04/10/2021   EKG: Sinus rhythm, rate 65 axis within normal limits, intervals within normal limits, no acute ST-T wave changes.  04/13/2021  Wt Readings from Last 3 Encounters:  05/15/21 205 lb (93 kg)  04/13/21 207 lb 12.8 oz (94.3 kg)  02/14/21 213 lb 12.8 oz (97 kg)      Other studies Reviewed: Additional studies/ records that were reviewed today include: Extensive review of hospital records Review of the above  records demonstrates:   See elsewhere   ASSESSMENT AND PLAN:  Cardiomyopathy - The patient's ejection fraction is 30 - 35% at the last echo.  He will tolerate med titration.  Actually think he might be volume depleted so I am going to reduce his Demadex by removing the extra half dose that he is taking 3 times a week.  I will check a basic metabolic profile and CBC today.  Of note he is going to reduce his metoprolol back down to the half dose that he was prescribed.   Atrial fibrillation - Mr. LATHANIEL LEGATE has a CHA2DS2 - VASc score of 5.   Today clinically he is in sinus rhythm.  He is tolerating anticoagulation.  I will be checking a CBC.   VT - This is managed as above.  He has had no documented evidence of this.    Hypertension -  This is being managed in the context of treating his CHF .  He is running low and meds will be adjusted as below.  CAD - Not suspecting active ischemia.  No change in therapy.  CKD III Creatinine is I will check this as as above.   AAA This was 3.2, 2020.  No change in therapy.  No further imaging at this point.  He would be a poor candidate for intervention right now.    Current medicines are reviewed at length with the patient today.  The patient does not have concerns regarding medicines.  The following changes have been made:   As above  Labs/ tests ordered today include:  None  Orders Placed This Encounter  Procedures   CBC   Basic metabolic panel      Disposition:   FU with me in 6 months.    Signed, Minus Breeding, MD  05/15/2021 1:52 PM    [facility]

## 2021-05-14 NOTE — Progress Notes (Signed)
Remote ICD transmission.   

## 2021-05-15 ENCOUNTER — Encounter: Payer: Self-pay | Admitting: Cardiology

## 2021-05-15 ENCOUNTER — Ambulatory Visit: Payer: PPO | Admitting: Cardiology

## 2021-05-15 ENCOUNTER — Other Ambulatory Visit: Payer: Self-pay

## 2021-05-15 VITALS — BP 90/50 | HR 69 | Ht 73.0 in | Wt 205.0 lb

## 2021-05-15 DIAGNOSIS — I2510 Atherosclerotic heart disease of native coronary artery without angina pectoris: Secondary | ICD-10-CM

## 2021-05-15 DIAGNOSIS — I4891 Unspecified atrial fibrillation: Secondary | ICD-10-CM | POA: Diagnosis not present

## 2021-05-15 DIAGNOSIS — I428 Other cardiomyopathies: Secondary | ICD-10-CM | POA: Diagnosis not present

## 2021-05-15 DIAGNOSIS — I10 Essential (primary) hypertension: Secondary | ICD-10-CM | POA: Diagnosis not present

## 2021-05-15 MED ORDER — TORSEMIDE 20 MG PO TABS: 20.0000 mg | ORAL_TABLET | Freq: Every day | ORAL | 1 refills | Status: AC

## 2021-05-15 NOTE — Patient Instructions (Signed)
Medication Instructions:  DECREASE Demadex (torsemide) to 20mg  daily.   *If you need a refill on your cardiac medications before your next appointment, please call your pharmacy*   Lab Work: CBC, BMET If you have labs (blood work) drawn today and your tests are completely normal, you will receive your results only by: [place] (if you have MyChart) OR A paper copy in the mail If you have any lab test that is abnormal or we need to change your treatment, we will call you to review the results.   Testing/Procedures: None ordered.    Follow-Up: At [facility], you and your health needs are our priority.  As part of our continuing mission to provide you with exceptional heart care, we have created designated Provider Care Teams.  These Care Teams include your primary Cardiologist (physician) and Advanced Practice Providers (APPs -  Physician Assistants and Nurse Practitioners) who all work together to provide you with the care you need, when you need it.  We recommend signing up for the patient portal called "MyChart".  Sign up information is provided on this After Visit Summary.  MyChart is used to connect with patients for Virtual Visits (Telemedicine).  Patients are able to view lab/test results, encounter notes, upcoming appointments, etc.  Non-urgent messages can be sent to your provider as well.   To learn more about what you can do with MyChart, go to [email or web address].    Your next appointment:   6 week(s)  The format for your next appointment:   In Person  Provider:   Minus Breeding, MD

## 2021-05-16 LAB — BASIC METABOLIC PANEL
BUN/Creatinine Ratio: 14 (ref 10–24)
BUN: 19 mg/dL (ref 8–27)
CO2: 27 mmol/L (ref 20–29)
Calcium: 8.9 mg/dL (ref 8.6–10.2)
Chloride: 99 mmol/L (ref 96–106)
Creatinine, Ser: 1.35 mg/dL — ABNORMAL HIGH (ref 0.76–1.27)
Glucose: 91 mg/dL (ref 65–99)
Potassium: 4.7 mmol/L (ref 3.5–5.2)
Sodium: 139 mmol/L (ref 134–144)
eGFR: 53 mL/min/{1.73_m2} — ABNORMAL LOW (ref >59)

## 2021-05-16 LAB — CBC
Hematocrit: 37.2 % — ABNORMAL LOW (ref 37.5–51.0)
Hemoglobin: 11.9 g/dL — ABNORMAL LOW (ref 13.0–17.7)
MCH: 31.1 pg (ref 26.6–33.0)
MCHC: 32 g/dL (ref 31.5–35.7)
MCV: 97 fL (ref 79–97)
Platelets: 234 10*3/uL (ref 150–450)
RBC: 3.83 x10E6/uL — ABNORMAL LOW (ref 4.14–5.80)
RDW: 14.8 % (ref 11.6–15.4)
WBC: 6.7 10*3/uL (ref 3.4–10.8)

## 2021-05-19 DIAGNOSIS — I5022 Chronic systolic (congestive) heart failure: Secondary | ICD-10-CM | POA: Diagnosis not present

## 2021-05-19 DIAGNOSIS — J432 Centrilobular emphysema: Secondary | ICD-10-CM | POA: Diagnosis not present

## 2021-05-19 DIAGNOSIS — F419 Anxiety disorder, unspecified: Secondary | ICD-10-CM | POA: Diagnosis not present

## 2021-05-19 DIAGNOSIS — Z9981 Dependence on supplemental oxygen: Secondary | ICD-10-CM | POA: Diagnosis not present

## 2021-05-19 DIAGNOSIS — I714 Abdominal aortic aneurysm, without rupture: Secondary | ICD-10-CM | POA: Diagnosis not present

## 2021-05-19 DIAGNOSIS — I480 Paroxysmal atrial fibrillation: Secondary | ICD-10-CM | POA: Diagnosis not present

## 2021-05-29 DIAGNOSIS — I714 Abdominal aortic aneurysm, without rupture: Secondary | ICD-10-CM | POA: Diagnosis not present

## 2021-06-30 DIAGNOSIS — R600 Localized edema: Secondary | ICD-10-CM | POA: Diagnosis not present

## 2021-06-30 DIAGNOSIS — I714 Abdominal aortic aneurysm, without rupture: Secondary | ICD-10-CM | POA: Diagnosis not present

## 2021-06-30 DIAGNOSIS — I5022 Chronic systolic (congestive) heart failure: Secondary | ICD-10-CM | POA: Diagnosis not present

## 2021-06-30 DIAGNOSIS — I429 Cardiomyopathy, unspecified: Secondary | ICD-10-CM | POA: Diagnosis not present

## 2021-06-30 DIAGNOSIS — I10 Essential (primary) hypertension: Secondary | ICD-10-CM | POA: Diagnosis not present

## 2021-06-30 DIAGNOSIS — E039 Hypothyroidism, unspecified: Secondary | ICD-10-CM | POA: Diagnosis not present

## 2021-06-30 DIAGNOSIS — I480 Paroxysmal atrial fibrillation: Secondary | ICD-10-CM | POA: Diagnosis not present

## 2021-07-18 DIAGNOSIS — R609 Edema, unspecified: Secondary | ICD-10-CM | POA: Diagnosis not present

## 2021-07-22 ENCOUNTER — Ambulatory Visit (INDEPENDENT_AMBULATORY_CARE_PROVIDER_SITE_OTHER): Payer: PPO

## 2021-07-22 DIAGNOSIS — I428 Other cardiomyopathies: Secondary | ICD-10-CM | POA: Diagnosis not present

## 2021-07-22 LAB — CUP PACEART REMOTE DEVICE CHECK
Battery Remaining Longevity: 98 mo
Battery Remaining Percentage: 94 %
Battery Voltage: 3.02 V
Brady Statistic AP VP Percent: 1 %
Brady Statistic AP VS Percent: 53 %
Brady Statistic AS VP Percent: 1 %
Brady Statistic AS VS Percent: 45 %
Brady Statistic RA Percent Paced: 51 %
Brady Statistic RV Percent Paced: 1 %
Date Time Interrogation Session: 20220830020152
HighPow Impedance: 68 Ohm
Implantable Lead Implant Date: 20130213
Implantable Lead Implant Date: 20130213
Implantable Lead Location: 753859
Implantable Lead Location: 753860
Implantable Pulse Generator Implant Date: 20220228
Lead Channel Impedance Value: 340 Ohm
Lead Channel Impedance Value: 400 Ohm
Lead Channel Pacing Threshold Amplitude: 0.5 V
Lead Channel Pacing Threshold Amplitude: 1 V
Lead Channel Pacing Threshold Pulse Width: 0.5 ms
Lead Channel Pacing Threshold Pulse Width: 0.6 ms
Lead Channel Sensing Intrinsic Amplitude: 11.5 mV
Lead Channel Sensing Intrinsic Amplitude: 2 mV
Lead Channel Setting Pacing Amplitude: 1.5 V
Lead Channel Setting Pacing Amplitude: 2.5 V
Lead Channel Setting Pacing Pulse Width: 0.5 ms
Lead Channel Setting Sensing Sensitivity: 0.5 mV
Pulse Gen Serial Number: 111038341

## 2021-07-23 DIAGNOSIS — R600 Localized edema: Secondary | ICD-10-CM | POA: Diagnosis not present

## 2021-07-23 DIAGNOSIS — N1831 Chronic kidney disease, stage 3a: Secondary | ICD-10-CM | POA: Diagnosis not present

## 2021-08-04 NOTE — Progress Notes (Signed)
Remote ICD transmission.   

## 2021-08-26 DIAGNOSIS — R972 Elevated prostate specific antigen [PSA]: Secondary | ICD-10-CM | POA: Diagnosis not present

## 2021-08-26 DIAGNOSIS — Z125 Encounter for screening for malignant neoplasm of prostate: Secondary | ICD-10-CM | POA: Diagnosis not present

## 2021-08-26 DIAGNOSIS — Z0000 Encounter for general adult medical examination without abnormal findings: Secondary | ICD-10-CM | POA: Diagnosis not present

## 2021-08-26 DIAGNOSIS — I10 Essential (primary) hypertension: Secondary | ICD-10-CM | POA: Diagnosis not present

## 2021-08-26 DIAGNOSIS — E039 Hypothyroidism, unspecified: Secondary | ICD-10-CM | POA: Diagnosis not present

## 2021-09-03 DIAGNOSIS — J432 Centrilobular emphysema: Secondary | ICD-10-CM | POA: Diagnosis not present

## 2021-09-03 DIAGNOSIS — I5022 Chronic systolic (congestive) heart failure: Secondary | ICD-10-CM | POA: Diagnosis not present

## 2021-09-03 DIAGNOSIS — Z0000 Encounter for general adult medical examination without abnormal findings: Secondary | ICD-10-CM | POA: Diagnosis not present

## 2021-09-03 DIAGNOSIS — N1831 Chronic kidney disease, stage 3a: Secondary | ICD-10-CM | POA: Diagnosis not present

## 2021-09-03 DIAGNOSIS — D692 Other nonthrombocytopenic purpura: Secondary | ICD-10-CM | POA: Diagnosis not present

## 2021-09-03 DIAGNOSIS — N1832 Chronic kidney disease, stage 3b: Secondary | ICD-10-CM | POA: Diagnosis not present

## 2021-09-03 DIAGNOSIS — I700 Atherosclerosis of aorta: Secondary | ICD-10-CM | POA: Diagnosis not present

## 2021-09-03 DIAGNOSIS — Z23 Encounter for immunization: Secondary | ICD-10-CM | POA: Diagnosis not present

## 2021-09-03 DIAGNOSIS — Z9981 Dependence on supplemental oxygen: Secondary | ICD-10-CM | POA: Diagnosis not present

## 2021-09-03 DIAGNOSIS — I429 Cardiomyopathy, unspecified: Secondary | ICD-10-CM | POA: Diagnosis not present

## 2021-09-03 DIAGNOSIS — I130 Hypertensive heart and chronic kidney disease with heart failure and stage 1 through stage 4 chronic kidney disease, or unspecified chronic kidney disease: Secondary | ICD-10-CM | POA: Diagnosis not present

## 2021-09-03 DIAGNOSIS — D6869 Other thrombophilia: Secondary | ICD-10-CM | POA: Diagnosis not present

## 2021-09-09 ENCOUNTER — Telehealth: Payer: Self-pay

## 2021-09-09 NOTE — Telephone Encounter (Signed)
Patient and wife returning call to discuss VT treated with ATP yesterday morning. Patient states he was standing up in the kitchen cooking eggs when he felt as if he would pass out. He immediately sat down, leaned forward, with resolution of symptoms. Reviewed medications. Patient is taking Amiodarone 200 mg Q am, Toprol XL 25mg  tabs (taking 1/2 tab, 12.5mg ) BID, and Ranexa 2000mg  BID. Patient states he has been feeling "weak" for some time and reported to PCP who reportedly increased potassium dosage. Per A. T[place], [place], patient needs to be seen in clinic Friday 09/12/21. Patient agreed to appointment with Jens Som, PA at 1:45pm. Shock plan, [place] restrictions (patient does not drive) reviewed. Patient and wife verbalized understanding and thankful for follow up. Will continue to follow and trend remotes/episodes.

## 2021-09-09 NOTE — Telephone Encounter (Signed)
"  [facility] alert for VT therapies. Presenting rhythm ApVs. Episode on 09/08/21 @ 0945 shows VT with avg rate 202bpm terminated with 1 burst of ATP--> AsVs. Routing for further review/management per protocol"  Unsuccessful telephone encounter to follow up with patient/wife related to VT episode that converted with ATP x 1, to discuss symptoms, medication compliance, shock plan, and [place] driving law. [employer] compliant VM message left requesting call back to [phone].

## 2021-09-10 NOTE — Telephone Encounter (Signed)
Spoke with pt who states he is taking medications as prescribed.  Confirmed pt's appointment scheduled for 09/12/2021 at 145 with pt.  Pt thanked Therapist, sports for the call.

## 2021-09-12 ENCOUNTER — Encounter: Payer: Self-pay | Admitting: Physician Assistant

## 2021-09-12 ENCOUNTER — Other Ambulatory Visit: Payer: Self-pay

## 2021-09-12 ENCOUNTER — Ambulatory Visit: Payer: PPO | Admitting: Physician Assistant

## 2021-09-12 VITALS — BP 100/62 | HR 68 | Ht 73.0 in | Wt 200.0 lb

## 2021-09-12 DIAGNOSIS — Z95810 Presence of automatic (implantable) cardiac defibrillator: Secondary | ICD-10-CM

## 2021-09-12 DIAGNOSIS — I10 Essential (primary) hypertension: Secondary | ICD-10-CM

## 2021-09-12 DIAGNOSIS — I472 Ventricular tachycardia, unspecified: Secondary | ICD-10-CM | POA: Diagnosis not present

## 2021-09-12 DIAGNOSIS — I5022 Chronic systolic (congestive) heart failure: Secondary | ICD-10-CM | POA: Diagnosis not present

## 2021-09-12 DIAGNOSIS — I428 Other cardiomyopathies: Secondary | ICD-10-CM | POA: Diagnosis not present

## 2021-09-12 DIAGNOSIS — I4819 Other persistent atrial fibrillation: Secondary | ICD-10-CM | POA: Diagnosis not present

## 2021-09-12 LAB — CUP PACEART INCLINIC DEVICE CHECK
Battery Remaining Longevity: 104 mo
Brady Statistic RA Percent Paced: 53 %
Brady Statistic RV Percent Paced: 0.85 %
Date Time Interrogation Session: 20221021192745
HighPow Impedance: 74.25 Ohm
Implantable Lead Implant Date: 20130213
Implantable Lead Implant Date: 20130213
Implantable Lead Location: 753859
Implantable Lead Location: 753860
Implantable Pulse Generator Implant Date: 20220228
Lead Channel Impedance Value: 375 Ohm
Lead Channel Impedance Value: 437.5 Ohm
Lead Channel Pacing Threshold Amplitude: 0.625 V
Lead Channel Pacing Threshold Amplitude: 1.25 V
Lead Channel Pacing Threshold Amplitude: 1.25 V
Lead Channel Pacing Threshold Pulse Width: 0.5 ms
Lead Channel Pacing Threshold Pulse Width: 0.5 ms
Lead Channel Pacing Threshold Pulse Width: 0.5 ms
Lead Channel Sensing Intrinsic Amplitude: 11.5 mV
Lead Channel Sensing Intrinsic Amplitude: 3.3 mV
Lead Channel Setting Pacing Amplitude: 1.625
Lead Channel Setting Pacing Amplitude: 2.5 V
Lead Channel Setting Pacing Pulse Width: 0.5 ms
Lead Channel Setting Sensing Sensitivity: 0.5 mV
Pulse Gen Serial Number: 111038341

## 2021-09-12 MED ORDER — AMIODARONE HCL 200 MG PO TABS: 200.0000 mg | ORAL_TABLET | Freq: Two times a day (BID) | ORAL | 2 refills | Status: AC

## 2021-09-12 NOTE — Patient Instructions (Signed)
Medication Instructions:   START TAKING AMIODARONE 200 MG TWICE A DAY   *If you need a refill on your cardiac medications before your next appointment, please call your pharmacy*   Lab Work: BMET AND [place]   If you have labs (blood work) drawn today and your tests are completely normal, you will receive your results only by: [place] (if you have MyChart) OR A paper copy in the mail If you have any lab test that is abnormal or we need to change your treatment, we will call you to review the results.   Testing/Procedures: NONE ORDERED  TODAY     Follow-Up: At [facility], you and your health needs are our priority.  As part of our continuing mission to provide you with exceptional heart care, we have created designated Provider Care Teams.  These Care Teams include your primary Cardiologist (physician) and Advanced Practice Providers (APPs -  Physician Assistants and Nurse Practitioners) who all work together to provide you with the care you need, when you need it.  We recommend signing up for the patient portal called "MyChart".  Sign up information is provided on this After Visit Summary.  MyChart is used to connect with patients for Virtual Visits (Telemedicine).  Patients are able to view lab/test results, encounter notes, upcoming appointments, etc.  Non-urgent messages can be sent to your provider as well.   To learn more about what you can do with MyChart, go to [email or web address].    Your next appointment:   6-8 week(s)  The format for your next appointment:   In Person  Provider:   You may see Virl Axe, MD or one of the following Advanced Practice Providers on your designated Care Team:   Tommye Standard, PA-C   IF [name] UNAVAILABLE [place] [facility]   Other Instructions

## 2021-09-12 NOTE — Progress Notes (Signed)
Cardiology Office Note Date:  09/12/2021  Patient ID:  Corey Tyler, Corey Tyler [birth year], MRN [id number] PCP:  [facility]  Cardiologist:  Dr. Percival Spanish Electrophysiologist: Dr. [name] Pulmonologist: Dr. Gwenette Greet ([place])    Chief Complaint:  ICD therapy, VT  History of Present Illness: Corey Tyler is a 80 y.o. male with history of Paroxysmal AFib, NICM w/ICD, chronic CHF (systolic), VT (polymorphic in 2014), COPD is O2 dependent  He was admitted to [facility] 11/02/16 with c/o weakness noted in monomorphic VT below his detection rate, his device reprogrammed and his home amiodarone re-bolused, discharged 11/04/16 on 400mg  BID with plans to schedule EST to make sure his sinus rate does not get above his detection rate.    He comes in today to be seen for Dr. [name] who last saw him in March 2021.  Discussed recent need for discontinueation and reduction of a number of his medicines 2/2 orthostatic dizziness He had gotten ATP for VT (Feb 2021) Amiodarone continued despite pulm hx  He saw Dr. Percival Spanish 05/14/20 Know hypotension, mentions moves slowly 2/2 this, no reports of syncope.  He was taking lopresor once daily and was changed to metoprolol succ. Planned to update his BMET   I saw him Sept 2021 He is with his wife today, he comes with a walker on O2. He denies any CP, palpitations, no dizzy spells, or syncope, no shocks He has baseline SOB/DOE, this is unchanged y his account, no unusual SOB or DOE, no symptoms of orthopnea or PND He has known hx of orthostatic dizziness that is bothersome to him, particularly when he needs to urinate with some urgency. He has not fainted or fallen. Both he and his wife are very knowledgeable on the difficult balance of volume management to avoid volume OL and dehydration. He thinks he is still taking metoprolol tartrate, his wife seems surprised, they aren't sure, but is taking a metoprolol 1/2 tab daily. He reports his weight at hom by his scale  200, and his weights range from 200-205lbs He does not feel like he is retaining right now. He denies any bleeding or signs of bleeding, has bruises on his arms, none otherwise He sees Dr. Percival Spanish next week as well as his PMD who will be doing his annual labs. No arrhythmias, no changes were made.  He reached ERI 11/11/20 and planned for gen change though his pre-procedure COVID test came + and deferred.  He was admitted to [facility] 12/09/20 for VT/syncope, device therapy Device transmission (12/09/20) presenting rhythm is AS/VS Battery ERI reached 11/11/20 Lead measurements are good One VT episode 12/08/20 @ 11;12AM PMVT  > ATP > MMVT >ATP  > shock successful No other arrhythmias Labs were OK Suspect perhaps triggered by his COVID illness noting his inflammatory markers were quite elevated, and did report mild symptoms of illness and low grade ever at home,  his amiodarone increased to 200mg  BID at least for the short term. Discharged 1/19/2 with a short course of steroids and Remdesivir  Subsequent phone note reports unable to tolerate BID amiodarone 2/2 fatigue and self reduced back to daily and his metoprolol to 1/2 tab daily  Feb 2022 had ICD gen change  May 2022 hospitalized with COPD exacerbation, echo during this admission noted normal LVEF  He last saw Dr. Percival Spanish June 2022, pt reported since Jan hospitalization he had continued to feel weak, functional decline, described orthostatic dizziness, using O2, was on full tab of metoprolol ?  Suspect volume depleted  and demadex reduced, BP low and his metoprolol reduced back to 1/2 tab  09/09/21 device clinic note with episode of VT treated successfully by ATP on 10/17 Pt was symptomatic with near syncope, compliant with medicines Planned to see in office   TODAY He comes with his wife They report he knew exactly that he was having VT when it happened. He became suddenly weak, sat down and leaned forward and then it eased off. He  has not been having any CP He is always SOB, this seems at his baseline No syncope, but got lightheaded with the VT episode Some orthostatic dizziness on occasion  Home BPs with change in his meds by Dr. Warren Lacy is better, low 100/s 50's-60 range He wears support stockings   Device information: SJM dual chamber ICD, implanted 01/06/12, Dr. [name], primary prevention Gen change 01/20/21  VT hx + hx ploymorphic VT with therapy 2014 Monomorphic VT below detection rate Dec 2017 VT ablation 06/10/2017 (Dr. Lovena Le) Nov 2018 VT ATP Feb 2021 ATP/shock appropriate Jan 2022 (in environment of COVID illness)   AAD hx 2017 Amiodarone is current 2018 Ranexa > uptitrated is current Record reports intolerant of Mexiletine (feeling of off balance)  AFib Hx GI bleed on Xarelto    Past Medical History:  Diagnosis Date   A-fib ([place])    a. Noted on 12/2012 interrogation, placed on Apixaban and ultimately stopped NOACs 2/2 personal decision 8/14.   AICD (automatic cardioverter/defibrillator) present    Cardiac defibrillator -dual  [employer]   Arthritis    Atrial tachycardia-non sustained    a. Noted on 03/2012 interrogation.   Chronic systolic heart failure (HCC)    EF down to 20 to 25% per echo November 2012   COPD (chronic obstructive pulmonary disease) ([place])    Hyperlipidemia    Hypertension    ICD (implantable cardiac defibrillator) in place    NICM (nonischemic cardiomyopathy) ([place])    a. Normal cors 2000. b. Minimal plaque 2013.   Old myocardial infarction    a. ?Silent MI. b. Large fixed inferolateral defect c/w with prior infarct, no ischemia. c. Cath in 2000/2013 with only minimal CAD.   Presence of permanent cardiac pacemaker    Pulmonary nodule, right    Last scan in 2010 showing stability; felt to be benign.   PVC's (premature ventricular contractions)    Ventricular tachycardia, polymorphic    Rx via ICD 12/14    Past Surgical History:  Procedure Laterality Date    [place]  03/30/2013   Procedure: CARDIOVERSION;  Surgeon: [facility] Head[facility], MD;  Location: [place];  Service: Cardiovascular;;   COLON SURGERY     COLONOSCOPY N/A 09/25/2013   Procedure: COLONOSCOPY;  Surgeon: Jerene Bears, MD;  Location: [facility] ENDOSCOPY;  Service: Endoscopy;  Laterality: N/A;   ICD  12/2011   [name]   ICD GENERATOR CHANGEOUT N/A 01/20/2021   Procedure: ICD GENERATOR CHANGEOUT;  Surgeon: Deboraha Sprang, MD;  Location: [place] CV LAB;  Service: Cardiovascular;  Laterality: N/A;   IMPLANTABLE CARDIOVERTER DEFIBRILLATOR IMPLANT N/A 01/06/2012   Procedure: IMPLANTABLE CARDIOVERTER DEFIBRILLATOR IMPLANT;  Surgeon: Deboraha Sprang, MD;  Location: [facility] CATH LAB;  Service: Cardiovascular;  Laterality: N/A;   PACEMAKER IMPLANT     [name]   TEE WITHOUT CARDIOVERSION N/A 03/30/2013   Procedure: TRANSESOPHAGEAL ECHOCARDIOGRAM (TEE);  Surgeon: [facility], MD;  Location: [facility]  ENDOSCOPY;  Service: Cardiovascular;  Laterality: N/A;   V TACH ABLATION  06/10/2017   V TACH ABLATION N/A 06/10/2017   Procedure: V Tach Ablation;  Surgeon: Evans Lance, MD;  Location: [place] CV LAB;  Service: Cardiovascular;  Laterality: N/A;    Current Outpatient Medications  Medication Sig Dispense Refill   acetaminophen (TYLENOL) 500 MG tablet Take 500 mg by mouth every 6 (six) hours as needed for headache (pain).     albuterol (PROVENTIL) (2.5 MG/3ML) 0.083% nebulizer solution Take 2.5 mg by nebulization every 6 (six) hours as needed for wheezing or shortness of breath.     albuterol (VENTOLIN HFA) 108 (90 Base) MCG/ACT inhaler Inhale 2 puffs into the lungs every 6 (six) hours as needed for wheezing or shortness of breath.     budesonide-formoterol (SYMBICORT) 160-4.5 MCG/ACT inhaler Inhale 2 puffs into the lungs 2 (two) times daily.     Carboxymethylcellul-Glycerin (LUBRICATING EYE DROPS OP) Place 1 drop into  both eyes daily as needed (dry eyes).     cholecalciferol (VITAMIN D) 1000 UNITS tablet Take 1,000 Units by mouth daily.     ELIQUIS 5 MG TABS tablet TAKE 1 TABLET BY MOUTH TWICE A DAY 60 tablet 5   finasteride (PROSCAR) 5 MG tablet Take 5 mg by mouth daily.      guaiFENesin (MUCINEX) 600 MG 12 hr tablet Take 2 tablets (1,200 mg total) by mouth 2 (two) times daily. 30 tablet 0   levothyroxine (SYNTHROID, LEVOTHROID) 125 MCG tablet Take 125 mcg by mouth daily before breakfast.      MAGNESIUM PO Take 420 mg by mouth at bedtime.     melatonin 5 MG TABS Take 1 tablet (5 mg total) by mouth at bedtime as needed (sleep). 30 tablet 0   metoprolol succinate (TOPROL XL) 25 MG 24 hr tablet Take 0.5 tablets (12.5 mg total) by mouth at bedtime. (Patient taking differently: at bedtime. Take 0.5 tablet by mouth in the morning and 0.5 tablet by mouth in the evening.) 30 tablet 0   Multiple Vitamins-Minerals (MULTIVITAMIN WITH MINERALS) tablet Take 1 tablet by mouth daily. Mens 50 plus     nitroGLYCERIN (NITROSTAT) 0.4 MG SL tablet Place 0.4 mg under the tongue every 5 (five) minutes x 3 doses as needed for chest pain.     Omega-3 Fatty Acids (FISH OIL) 1000 MG CPDR Take 1,000 mg by mouth daily.     OXYGEN Inhale 4 L into the lungs See admin instructions.     potassium chloride (MICRO-K) 10 MEQ CR capsule Take 20 mEq by mouth daily.     promethazine (PHENERGAN) 25 MG tablet Take 25 mg by mouth daily as needed for nausea or vomiting.      Psyllium (METAMUCIL PO) Take 15 g by mouth daily. Mix in liquid and drink     ranolazine (RANEXA) 1000 MG SR tablet Take 1 tablet (1,000 mg total) by mouth 2 (two) times daily. 180 tablet 3   sodium chloride (OCEAN) 0.65 % SOLN nasal spray Place 1 spray into both nostrils daily as needed for congestion.     tamsulosin (FLOMAX) 0.4 MG CAPS capsule Take 0.4 mg by mouth at bedtime.     Tiotropium Bromide Monohydrate (SPIRIVA RESPIMAT) 2.5 MCG/ACT AERS Inhale 2 puffs into the lungs  daily as needed (sob).     torsemide (DEMADEX) 20 MG tablet Take 1 tablet (20 mg total) by mouth daily. (Patient taking differently: One and a half tablets by mouth on Wednesday,  Saturday, and sundays alternating one tablet and Monday, Tuesday, and Friday.) 135 tablet 1   amiodarone (PACERONE) 200 MG tablet Take 1 tablet (200 mg total) by mouth 2 (two) times daily. 180 tablet 2   No current facility-administered medications for this visit.    Allergies:   Atorvastatin, Statins, Xarelto [rivaroxaban], Adhesive [tape], Codeine, Isosorbide nitrate, Latex, Levofloxacin, Lisinopril, Lorazepam, Mexiletine, and Sertraline   Social History:  The patient  reports that he quit smoking about 37 years ago. His smoking use included cigarettes. He has a 100.00 pack-year smoking history. He has never used smokeless tobacco. He reports that he does not drink alcohol and does not use drugs.   Family History:  The patient's family history includes Emphysema in his mother; Esophageal cancer in his brother; Heart disease in his mother; Heart failure in his father.  ROS:  Please see the history of present illness.  All other systems are reviewed and otherwise negative.   PHYSICAL EXAM:  VS:  BP 100/62   Pulse 68   Ht 6\' 1"  (1.854 m)   Wt 200 lb (90.7 kg)   SpO2 94% Comment: withg 2L 02  BMI 26.39 kg/m  BMI: Body mass index is 26.39 kg/m. Well nourished, well developed, in no acute distress  HEENT: normocephalic, atraumatic  Neck: no JVD, carotid bruits or masses Cardiac: RRR; no significant murmurs, no rubs, or gallops Lungs:   Somewhat diminished throughout, no wheezing, rhonchi or rales  Abd: soft, nontender MS: no deformity  Ext: trace edema  Skin: warm and dry, no rash Neuro:  No gross deficits appreciated Psych: euthymic mood, full affect  ICD site is stable, no tethering or discomfort   EKG:  Done today and reviewed by myself Baseline wandering SR 68bpm, no clear ischemic, new  changes  ICD interrogation done today and reviewed by myself:  Battery and lead measurements are good CorVue is above threshold VT one new episode in VT1 zone, 155bpm, with successful ATP x1   04/13/2021: TTE IMPRESSIONS   1. Left ventricular ejection fraction, by estimation, is 60 to 65%. The  left ventricle has normal function. The left ventricle has no regional  wall motion abnormalities. Left ventricular diastolic parameters are  consistent with Grade I diastolic  dysfunction (impaired relaxation).   2. Right ventricular systolic function is normal. The right ventricular  size is normal. There is moderately elevated pulmonary artery systolic  pressure.   3. Eccentric posterior MR jet, eccentricity makes quantifying difficult.  . Mild to moderate mitral valve regurgitation. No evidence of mitral  stenosis.   4. The aortic valve has an indeterminant number of cusps. Aortic valve  regurgitation is mild. No aortic stenosis is present.   5. Moderate pulmonary HTN, PASP is 50 mmHg.   6. The inferior vena cava is normal in size with greater than 50%  respiratory variability, suggesting right atrial pressure of 3 mmHg.    12/10/20: TTE IMPRESSIONS  1. Akinesis of the basal inferolateral wall with overall moderate to  severe LV dysfunction.   2. Left ventricular ejection fraction, by estimation, is 30 to 35%. The  left ventricle has moderate to severely decreased function. The left  ventricle demonstrates regional wall motion abnormalities (see scoring  diagram/findings for description). The  left ventricular internal cavity size was mildly dilated. There is mild  left ventricular hypertrophy. Left ventricular diastolic parameters are  consistent with Grade I diastolic dysfunction (impaired relaxation).   3. Right ventricular systolic function is normal. The right  ventricular  size is normal.   4. Left atrial size was mildly dilated.   5. The mitral valve is normal in structure.  Mild mitral valve  regurgitation. No evidence of mitral stenosis.   6. The aortic valve is tricuspid. Aortic valve regurgitation is mild.  Mild aortic valve sclerosis is present, with no evidence of aortic valve  stenosis.   7. Aortic dilatation noted. There is borderline dilatation of the  ascending aorta, measuring 38 mm.   8. The inferior vena cava is normal in size with greater than 50%  respiratory variability, suggesting right atrial pressure of 3 mmHg.    08/18/2019: TTE IMPRESSIONS   1. Left ventricular ejection fraction, by visual estimation, is 25 to  30%. The left ventricle has severely decreased function. Mildly increased  left ventricular size. There is mildly increased left ventricular  hypertrophy.   2. Left ventricular diastolic Doppler parameters are consistent with  impaired relaxation pattern of LV diastolic filling.   3. Global right ventricle has normal systolic function.The right  ventricular size is mildly enlarged.   4. Left atrial size was mildly dilated.   5. Right atrial size was normal.   6. The mitral valve is normal in structure. Mild mitral valve  regurgitation. No evidence of mitral stenosis.   7. The tricuspid valve is normal in structure. Tricuspid valve  regurgitation is mild.   8. The aortic valve is tricuspid Aortic valve regurgitation is mild by  color flow Doppler. Mild aortic valve sclerosis without stenosis.   9. The pulmonic valve was grossly normal. Pulmonic valve regurgitation is  trivial by color flow Doppler.  10. Aortic dilatation noted.  11. There is mild dilatation of the aortic root measuring 39 mm.  12. Mildly elevated pulmonary artery systolic pressure.  13. A pacer wire is visualized.  14. The inferior vena cava is normal in size with greater than 50%  respiratory variability, suggesting right atrial pressure of 3 mmHg.  15. Severe LV systolic dysfunction; grade 1 diastolic dysfunction; mild  LVH and LVE; mild AI, MR and TR;  mild LAE and RVE; mildly elevated  pulmonary pressure.     06/10/2017: EPS/ablation Conclusion: Successful EP study and catheter ablation of the patient's clinical ventricular tachycardia utilizing 3-dimensional electro anatomic mapping. There is a paucity of scar tissue on the endocardium of the left ventricle. A second VT was induced which was hemodynamically unstable and could not be mapped. There is no clearcut scar tissue associated with the endocardial surface of the left ventricle from which this VT originated.    07/22/16: TTE Study Conclusions - Left ventricle: The cavity size was mildly dilated. There was   mild focal basal hypertrophy of the septum. Systolic function was   severely reduced. The estimated ejection fraction was in the   range of 25% to 30%. Diffuse hypokinesis. Hypokinesis is worse in   the inferolateral and anteriolateral myocardium. Doppler   parameters are consistent with abnormal left ventricular   relaxation (grade 1 diastolic dysfunction). Doppler parameters   are consistent with indeterminate ventricular filling pressure. - Aortic valve: Transvalvular velocity was within the normal range.   There was no stenosis. There was mild regurgitation. - Mitral valve: Transvalvular velocity was within the normal range.   There was no evidence for stenosis. There was mild regurgitation. - Left atrium: The atrium was moderately dilated. 53mm - Right ventricle: The cavity size was mildly dilated. Wall   thickness was normal. Systolic function was mildly reduced. -  Tricuspid valve: There was mild regurgitation. - Pulmonic valve: Transvalvular velocity was within the normal   range. There was no evidence for stenosis. There was mild   regurgitation. - Pulmonary arteries: Systolic pressure was mildly increased. PA   peak pressure: 41 mm Hg (S). - Pericardium, extracardiac: A small pericardial effusion was   identified.  Recent Labs: 12/09/2020: TSH 0.833 12/11/2020:  B Natriuretic Peptide 129.8; Magnesium 2.2 04/10/2021: ALT 28 05/15/2021: BUN 19; Creatinine, Ser 1.35; Hemoglobin 11.9; Platelets 234; Potassium 4.7; Sodium 139  No results found for requested labs within last 8760 hours.   CrCl cannot be calculated (Patient's most recent lab result is older than the maximum 21 days allowed.).   Wt Readings from Last 3 Encounters:  09/12/21 200 lb (90.7 kg)  05/15/21 205 lb (93 kg)  04/13/21 207 lb 12.8 oz (94.3 kg)     Other studies reviewed: Additional studies/records reviewed today include: summarized above  ASSESSMENT AND PLAN:  1. VT     on Amiodarone and ranexa      He follows with pulmonary at the [place], though this MD is retiring and they are going to allow him to see an outside pulmonologist, I suggested Marin Comment bauer A bit worrisome to re-titrate amiodarone though not sure there is any other option BP will not support more BB  A little conflicting information, his pulmonologist has told them his pulmonary issues are minimal and his SOB/O2 needs are cardiac driven  At his last hospitalization COPD was classified as C (severe)   2. ICD     Intact function     no programming changes made.       3. AFib, Persistent     burden <1 %     CHA2DS2Vasc is 5, on Eliquis, appropriately dosed  4. NICM 5. Chronic CHF (systolic)     BP has limited GDMT     On BB, torsemide     Cor Vue looks OK, he reports stable weights at home     No exam findings to suggest volume OL currently      <1 % VP  LAST echo with normalized EF   6. HTN     With relative lower BPs on meds     Some orthostaic dizziness     Discussed safety   His last echo is surprising with normalized LVEF He has a long hx of VT though if EF is better, do we need to revisit ischemia? He has no ischemic symptoms I will reach out to Dr. Percival Spanish and Dr. [name] for their thoughts/recommendations For now increase amiodarone to 200mg  BID BMET, mag level    Disposition: back in  clinic in a month, sooner if needed      Current medicines are reviewed at length with the patient today.  The patient did not have any concerns regarding medicines.  Haywood Lasso, PA-C 09/12/2021 5:49 PM     [address]  [place] [address] [phone] (office)  [phone] (fax)

## 2021-09-13 LAB — BASIC METABOLIC PANEL
BUN/Creatinine Ratio: 15 (ref 10–24)
BUN: 24 mg/dL (ref 8–27)
CO2: 26 mmol/L (ref 20–29)
Calcium: 8.6 mg/dL (ref 8.6–10.2)
Chloride: 100 mmol/L (ref 96–106)
Creatinine, Ser: 1.59 mg/dL — ABNORMAL HIGH (ref 0.76–1.27)
Glucose: 90 mg/dL (ref 70–99)
Potassium: 4.1 mmol/L (ref 3.5–5.2)
Sodium: 138 mmol/L (ref 134–144)
eGFR: 43 mL/min/{1.73_m2} — ABNORMAL LOW (ref >59)

## 2021-09-13 LAB — MAGNESIUM: Magnesium: 2.3 mg/dL (ref 1.6–2.3)

## 2021-09-30 DIAGNOSIS — R3915 Urgency of urination: Secondary | ICD-10-CM | POA: Diagnosis not present

## 2021-09-30 DIAGNOSIS — R350 Frequency of micturition: Secondary | ICD-10-CM | POA: Diagnosis not present

## 2021-09-30 DIAGNOSIS — R3914 Feeling of incomplete bladder emptying: Secondary | ICD-10-CM | POA: Diagnosis not present

## 2021-10-01 ENCOUNTER — Other Ambulatory Visit: Payer: Self-pay

## 2021-10-01 ENCOUNTER — Ambulatory Visit (INDEPENDENT_AMBULATORY_CARE_PROVIDER_SITE_OTHER): Payer: No Typology Code available for payment source | Admitting: Pulmonary Disease

## 2021-10-01 ENCOUNTER — Encounter: Payer: Self-pay | Admitting: Pulmonary Disease

## 2021-10-01 VITALS — BP 120/72 | HR 66 | Ht 73.0 in | Wt 213.0 lb

## 2021-10-01 DIAGNOSIS — J449 Chronic obstructive pulmonary disease, unspecified: Secondary | ICD-10-CM | POA: Diagnosis not present

## 2021-10-01 DIAGNOSIS — R5381 Other malaise: Secondary | ICD-10-CM | POA: Diagnosis not present

## 2021-10-01 DIAGNOSIS — J9611 Chronic respiratory failure with hypoxia: Secondary | ICD-10-CM

## 2021-10-01 NOTE — Patient Instructions (Signed)
Use spiriva 2 puffs once daily in the morning.  Use Symbicort 2 puffs twice daily - rinse mouth out after each use  We will send in orders for long acting nebulizer treatments.  Brovana nebulizer treatment twice daily Yupelri Nebulizer treatment once daily Budesonide nebulizer treatment twice daily  Bovana and yupelri can be mixed together and taken at the same time.   Once you receive the long acting nebulizer solution, you can stop spiriva and symbicort.   We will order you home physical therapy.  We will check about enrolling you with our virtual pulmonary rehab program.

## 2021-10-01 NOTE — Progress Notes (Signed)
Synopsis: Referred in November 2022 for respiratory failure from [place] clinic  Subjective:   PATIENT ID: [name] GENDER: male DOB: January 23, 1941, MRN: [id number]  HPI  Chief Complaint  Patient presents with   Consult    Uses 4L O2 at night, 6L with exertion. Former Immunologist patient for COPD. [employer] is his O2 DME. Per patient and his wife, he can walk 10 steps before getting weak and SOB. Takes about 10 mins to recover.    Nataniel Gasper is an 80 year old male, former smoker with copd, chronic hypoxemic respiratory failure, atrial fibrillation, ventricular tachycardia episodes s/p AICD placement and congestive heart failure who is referred to pulmonary clinic for follow up from the [facility] clinic.   Patient reports significant limitations in his activity level over the last year or so.  He was previously using a walker to ambulate throughout his home and is now using a motorized wheelchair.  He can only walk 10 to 15 feet before needing to rest for 5 to 10 minutes in order to recover.  He does report bilateral lower extremity edema.  He was last seen by cardiology 09/12/2021, office note reviewed.  He is currently using Symbicort 2 puffs twice daily as needed and using Spiriva 2.5 MCG 1 puff twice daily.  He is using as needed albuterol nebulizer treatments with relief throughout the day.  He is accompanied by his wife.  Pulmonary function testing from 2015 showed an FVC 3.15L (68%), FEV1 1.85L (55%), ratio 59%, TLC 6.4L (86%), DLCO 31%.   He is using 4L of O2 at rest and at night and 6L with exertion.   Past Medical History:  Diagnosis Date   A-fib [facility])    a. Noted on 12/2012 interrogation, placed on Apixaban and ultimately stopped NOACs 2/2 personal decision 8/14.   AICD (automatic cardioverter/defibrillator) present    Cardiac defibrillator -dual  [facility]   Arthritis    Atrial tachycardia-non sustained    a. Noted on 03/2012 interrogation.   Chronic systolic heart failure (HCC)     EF down to 20 to 25% per echo November 2012   COPD (chronic obstructive pulmonary disease) ([place])    Hyperlipidemia    Hypertension    ICD (implantable cardiac defibrillator) in place    NICM (nonischemic cardiomyopathy) ([place])    a. Normal cors 2000. b. Minimal plaque 2013.   Old myocardial infarction    a. ?Silent MI. b. Large fixed inferolateral defect c/w with prior infarct, no ischemia. c. Cath in 2000/2013 with only minimal CAD.   Presence of permanent cardiac pacemaker    Pulmonary nodule, right    Last scan in 2010 showing stability; felt to be benign.   PVC's (premature ventricular contractions)    Ventricular tachycardia, polymorphic    Rx via ICD 12/14     Family History  Problem Relation Age of Onset   Emphysema Mother    Heart disease Mother        AVR   Heart failure Father        Died agwe 80   Esophageal cancer Brother    Colon cancer Neg Hx      Social History   Socioeconomic History   Marital status: Married    Spouse name: Hassan Rowan    Number of children: 2   Years of education: Not on file   Highest education level: Not on file  Occupational History   Occupation: Retired    Comment: Press photographer   Occupation:  Works    Fish farm manager: [employer]  Tobacco Use   Smoking status: Former    Packs/day: 2.50    Years: 40.00    Pack years: 100.00    Types: Cigarettes    Quit date: 11/24/1983    Years since quitting: 37.8   Smokeless tobacco: Never   Tobacco comments:    2 1/2 ppd x 40 years  Vaping Use   Vaping Use: Never used  Substance and Sexual Activity   Alcohol use: No   Drug use: No   Sexual activity: Yes    Birth control/protection: None  Other Topics Concern   Not on file  Social History Narrative   Still works at [employer] part-time   Lives with Wife in [place] Strain: Not on file  Food Insecurity: No Food Insecurity   Worried About Charity fundraiser in the Last Year: Never  true   Arboriculturist in the Last Year: Never true  Transportation Needs: No Transportation Needs   Lack of Transportation (Medical): No   Lack of Transportation (Non-Medical): No  Physical Activity: Not on file  Stress: Not on file  Social Connections: Not on file  Intimate Partner Violence: Not on file     Allergies  Allergen Reactions   Atorvastatin Shortness Of Breath and Cough   Statins Shortness Of Breath and Other (See Comments)    Muscle and joing pain and "These bother my lungs and make my gait unsteady, also"   Xarelto [Rivaroxaban] Other (See Comments)    Bleeding   Adhesive [Tape] Hives, Itching and Rash   Codeine Nausea Only   Isosorbide Nitrate Other (See Comments)    Made him feel bad   Latex Itching and Other (See Comments)    Rash, itching, burning   Levofloxacin Other (See Comments)    Tachycardia   Lisinopril Cough   Lorazepam Other (See Comments)    "felt bad"   Mexiletine Other (See Comments)    GI distress   Sertraline Other (See Comments)    Made him feel bad     Outpatient Medications Prior to Visit  Medication Sig Dispense Refill   acetaminophen (TYLENOL) 500 MG tablet Take 500 mg by mouth every 6 (six) hours as needed for headache (pain).     albuterol (PROVENTIL) (2.5 MG/3ML) 0.083% nebulizer solution Take 2.5 mg by nebulization every 6 (six) hours as needed for wheezing or shortness of breath.     albuterol (VENTOLIN HFA) 108 (90 Base) MCG/ACT inhaler Inhale 2 puffs into the lungs every 6 (six) hours as needed for wheezing or shortness of breath.     amiodarone (PACERONE) 200 MG tablet Take 1 tablet (200 mg total) by mouth 2 (two) times daily. 180 tablet 2   budesonide-formoterol (SYMBICORT) 160-4.5 MCG/ACT inhaler Inhale 2 puffs into the lungs 2 (two) times daily.     Carboxymethylcellul-Glycerin (LUBRICATING EYE DROPS OP) Place 1 drop into both eyes daily as needed (dry eyes).     cholecalciferol (VITAMIN D) 1000 UNITS tablet Take 1,000 Units  by mouth daily.     ELIQUIS 5 MG TABS tablet TAKE 1 TABLET BY MOUTH TWICE A DAY 60 tablet 5   finasteride (PROSCAR) 5 MG tablet Take 5 mg by mouth daily.      guaiFENesin (MUCINEX) 600 MG 12 hr tablet Take 2 tablets (1,200 mg total) by mouth 2 (two) times daily. 30 tablet 0   levothyroxine (  SYNTHROID, LEVOTHROID) 125 MCG tablet Take 125 mcg by mouth daily before breakfast.      MAGNESIUM PO Take 420 mg by mouth at bedtime.     melatonin 5 MG TABS Take 1 tablet (5 mg total) by mouth at bedtime as needed (sleep). 30 tablet 0   metoprolol succinate (TOPROL XL) 25 MG 24 hr tablet Take 0.5 tablets (12.5 mg total) by mouth at bedtime. (Patient taking differently: at bedtime. Take 0.5 tablet by mouth in the morning and 0.5 tablet by mouth in the evening.) 30 tablet 0   Multiple Vitamins-Minerals (MULTIVITAMIN WITH MINERALS) tablet Take 1 tablet by mouth daily. Mens 50 plus     nitroGLYCERIN (NITROSTAT) 0.4 MG SL tablet Place 0.4 mg under the tongue every 5 (five) minutes x 3 doses as needed for chest pain.     Omega-3 Fatty Acids (FISH OIL) 1000 MG CPDR Take 1,000 mg by mouth daily.     OXYGEN Inhale 4 L into the lungs See admin instructions.     potassium chloride (MICRO-K) 10 MEQ CR capsule Take 20 mEq by mouth daily.     promethazine (PHENERGAN) 25 MG tablet Take 25 mg by mouth daily as needed for nausea or vomiting.      Psyllium (METAMUCIL PO) Take 15 g by mouth daily. Mix in liquid and drink     ranolazine (RANEXA) 1000 MG SR tablet Take 1 tablet (1,000 mg total) by mouth 2 (two) times daily. 180 tablet 3   sodium chloride (OCEAN) 0.65 % SOLN nasal spray Place 1 spray into both nostrils daily as needed for congestion.     tamsulosin (FLOMAX) 0.4 MG CAPS capsule Take 0.4 mg by mouth at bedtime.     Tiotropium Bromide Monohydrate (SPIRIVA RESPIMAT) 2.5 MCG/ACT AERS Inhale 2 puffs into the lungs daily as needed (sob).     torsemide (DEMADEX) 20 MG tablet Take 1 tablet (20 mg total) by mouth daily.  (Patient taking differently: One and a half tablets by mouth on Wednesday, Saturday, and sundays alternating one tablet and Monday, Tuesday, and Friday.) 135 tablet 1   No facility-administered medications prior to visit.   Review of Systems  Constitutional:  Positive for malaise/fatigue. Negative for chills, fever and weight loss.  HENT:  Negative for congestion, sinus pain and sore throat.   Eyes: Negative.   Respiratory:  Positive for shortness of breath and wheezing. Negative for cough, hemoptysis and sputum production.   Cardiovascular:  Positive for leg swelling. Negative for chest pain, palpitations, orthopnea and claudication.  Gastrointestinal:  Negative for abdominal pain, heartburn, nausea and vomiting.  Genitourinary: Negative.   Musculoskeletal:  Negative for joint pain and myalgias.  Skin:  Negative for rash.  Neurological:  Negative for weakness.  Endo/Heme/Allergies: Negative.   Psychiatric/Behavioral: Negative.     Objective:   Vitals:   10/01/21 1358  BP: 120/72  Pulse: 66  SpO2: 92%  Weight: 213 lb (96.6 kg)  Height: 6\' 1"  (1.854 m)   Physical Exam Constitutional:      General: He is not in acute distress. HENT:     Head: Normocephalic and atraumatic.  Eyes:     Extraocular Movements: Extraocular movements intact.     Conjunctiva/sclera: Conjunctivae normal.     Pupils: Pupils are equal, round, and reactive to light.  Cardiovascular:     Rate and Rhythm: Normal rate and regular rhythm.     Pulses: Normal pulses.     Heart sounds: Normal heart sounds. No murmur  heard. Pulmonary:     Breath sounds: Decreased air movement present. Decreased breath sounds and wheezing (mild scattered) present.  Abdominal:     General: Bowel sounds are normal.     Palpations: Abdomen is soft.  Musculoskeletal:     Right lower leg: No edema.     Left lower leg: No edema.  Lymphadenopathy:     Cervical: No cervical adenopathy.  Skin:    General: Skin is warm and dry.   Neurological:     General: No focal deficit present.     Mental Status: He is alert.  Psychiatric:        Mood and Affect: Mood normal.        Behavior: Behavior normal.        Thought Content: Thought content normal.        Judgment: Judgment normal.   CBC    Component Value Date/Time   WBC 6.7 05/15/2021 1216   WBC 10.9 (H) 04/10/2021 0925   RBC 3.83 (L) 05/15/2021 1216   RBC 3.85 (L) 04/10/2021 0925   HGB 11.9 (L) 05/15/2021 1216   HCT 37.2 (L) 05/15/2021 1216   PLT 234 05/15/2021 1216   MCV 97 05/15/2021 1216   MCH 31.1 05/15/2021 1216   MCH 30.9 04/10/2021 0925   MCHC 32.0 05/15/2021 1216   MCHC 31.6 04/10/2021 0925   RDW 14.8 05/15/2021 1216   LYMPHSABS 0.6 (L) 12/11/2020 0540   LYMPHSABS 2.3 06/02/2017 1514   MONOABS 0.6 12/11/2020 0540   EOSABS 0.0 12/11/2020 0540   EOSABS 0.3 06/02/2017 1514   BASOSABS 0.0 12/11/2020 0540   BASOSABS 0.1 06/02/2017 1514   BMP Latest Ref Rng & Units 09/12/2021 05/15/2021 04/13/2021  Glucose 70 - 99 mg/dL 90 91 117(H)  BUN 8 - 27 mg/dL 24 19 42(H)  Creatinine 0.76 - 1.27 mg/dL 1.59(H) 1.35(H) 1.56(H)  BUN/Creat Ratio 10 - 24 15 14  -  Sodium 134 - 144 mmol/L 138 139 135  Potassium 3.5 - 5.2 mmol/L 4.1 4.7 4.5  Chloride 96 - 106 mmol/L 100 99 99  CO2 20 - 29 mmol/L 26 27 29   Calcium 8.6 - 10.2 mg/dL 8.6 8.9 8.5(L)   Chest imaging: CXR 04/10/2021 1. The appearance of the chest is concerning for multilobar bilateral pneumonia. Clinical correlation for signs and symptoms of viral infection is recommended. 2. Trace bilateral pleural effusions. 3. Aortic atherosclerosis.  PFT: PFT Results Latest Ref Rng & Units 07/05/2014  FVC-Pre L 3.13  FVC-Predicted Pre % 67  FVC-Post L 3.15  FVC-Predicted Post % 68  Pre FEV1/FVC % % 57  Post FEV1/FCV % % 59  FEV1-Pre L 1.78  FEV1-Predicted Pre % 52  FEV1-Post L 1.85  DLCO uncorrected ml/min/mmHg 11.14  DLCO [facility]% % 31  DLVA Predicted % 41  TLC L 6.40  TLC % Predicted % 86  RV %  Predicted % 112   Labs:  Path:  Echo 04/13/21: LVEF 60-65%. Grade I diastolic dysfunction. RV systolic function is normal. RV size is normal. Moderately elevated pulmonary artery systolic pressure, [id number]. Eccentric posterior MR jet. Mild to moderate mitral valve regurgitation.   Heart Catheterization:  Assessment & Plan:   Chronic respiratory failure with hypoxia ([place]) - Plan: Ambulatory referral to Home Health  Chronic obstructive pulmonary disease, unspecified COPD type ([place])  Physical deconditioning - Plan: Ambulatory referral to Home Health  Discussion: Charod Slawinski is an 80 year old male, former smoker with copd, chronic hypoxemic respiratory failure, atrial fibrillation, ventricular tachycardia  episodes s/p AICD placement and congestive heart failure who is referred to pulmonary clinic for follow up from the [place] clinic.   He has moderately severe obstructive defect and severe diffusion defect based on pulmonary function testing from 2015.   He has significant activity limitation due to dyspnea related to his COPD and diffusion defect along with his heart failure and issues with deconditioning.  He is to continue supplemental oxygen use, 3-4L at rest and 6L with activity.   We will transition him to long acting nebulizer therapy with budesonide 0.5mg  twice daily, Brovana twice daily and yupelri once daily nebulizer treatments. His lung function may be hindering him from adequately inhaling the spiriva and symbicort that he is currently using. He can continue to use the spiriva 2 puffs daily and sybicort 2 puffs twice daily until the nebulizer solution arrive. He can continue to use albuterol nebulizer treatments as needed.   We will order him home physical therapy to help with conditioning. We will also look into a referral to our virtual pulmonary rehab program.   Follow up in 4 months.   Freda Jackson, MD [facility] Office: [phone]   Current  Outpatient Medications:    acetaminophen (TYLENOL) 500 MG tablet, Take 500 mg by mouth every 6 (six) hours as needed for headache (pain)., Disp: , Rfl:    albuterol (PROVENTIL) (2.5 MG/3ML) 0.083% nebulizer solution, Take 2.5 mg by nebulization every 6 (six) hours as needed for wheezing or shortness of breath., Disp: , Rfl:    albuterol (VENTOLIN HFA) 108 (90 Base) MCG/ACT inhaler, Inhale 2 puffs into the lungs every 6 (six) hours as needed for wheezing or shortness of breath., Disp: , Rfl:    amiodarone (PACERONE) 200 MG tablet, Take 1 tablet (200 mg total) by mouth 2 (two) times daily., Disp: 180 tablet, Rfl: 2   budesonide-formoterol (SYMBICORT) 160-4.5 MCG/ACT inhaler, Inhale 2 puffs into the lungs 2 (two) times daily., Disp: , Rfl:    Carboxymethylcellul-Glycerin (LUBRICATING EYE DROPS OP), Place 1 drop into both eyes daily as needed (dry eyes)., Disp: , Rfl:    cholecalciferol (VITAMIN D) 1000 UNITS tablet, Take 1,000 Units by mouth daily., Disp: , Rfl:    ELIQUIS 5 MG TABS tablet, TAKE 1 TABLET BY MOUTH TWICE A DAY, Disp: 60 tablet, Rfl: 5   finasteride (PROSCAR) 5 MG tablet, Take 5 mg by mouth daily. , Disp: , Rfl:    guaiFENesin (MUCINEX) 600 MG 12 hr tablet, Take 2 tablets (1,200 mg total) by mouth 2 (two) times daily., Disp: 30 tablet, Rfl: 0   levothyroxine (SYNTHROID, LEVOTHROID) 125 MCG tablet, Take 125 mcg by mouth daily before breakfast. , Disp: , Rfl:    MAGNESIUM PO, Take 420 mg by mouth at bedtime., Disp: , Rfl:    melatonin 5 MG TABS, Take 1 tablet (5 mg total) by mouth at bedtime as needed (sleep)., Disp: 30 tablet, Rfl: 0   metoprolol succinate (TOPROL XL) 25 MG 24 hr tablet, Take 0.5 tablets (12.5 mg total) by mouth at bedtime. (Patient taking differently: at bedtime. Take 0.5 tablet by mouth in the morning and 0.5 tablet by mouth in the evening.), Disp: 30 tablet, Rfl: 0   Multiple Vitamins-Minerals (MULTIVITAMIN WITH MINERALS) tablet, Take 1 tablet by mouth daily. Mens 50  plus, Disp: , Rfl:    nitroGLYCERIN (NITROSTAT) 0.4 MG SL tablet, Place 0.4 mg under the tongue every 5 (five) minutes x 3 doses as needed for chest pain., Disp: ,  Rfl:    Omega-3 Fatty Acids (FISH OIL) 1000 MG CPDR, Take 1,000 mg by mouth daily., Disp: , Rfl:    OXYGEN, Inhale 4 L into the lungs See admin instructions., Disp: , Rfl:    potassium chloride (MICRO-K) 10 MEQ CR capsule, Take 20 mEq by mouth daily., Disp: , Rfl:    promethazine (PHENERGAN) 25 MG tablet, Take 25 mg by mouth daily as needed for nausea or vomiting. , Disp: , Rfl:    Psyllium (METAMUCIL PO), Take 15 g by mouth daily. Mix in liquid and drink, Disp: , Rfl:    ranolazine (RANEXA) 1000 MG SR tablet, Take 1 tablet (1,000 mg total) by mouth 2 (two) times daily., Disp: 180 tablet, Rfl: 3   sodium chloride (OCEAN) 0.65 % SOLN nasal spray, Place 1 spray into both nostrils daily as needed for congestion., Disp: , Rfl:    tamsulosin (FLOMAX) 0.4 MG CAPS capsule, Take 0.4 mg by mouth at bedtime., Disp: , Rfl:    Tiotropium Bromide Monohydrate (SPIRIVA RESPIMAT) 2.5 MCG/ACT AERS, Inhale 2 puffs into the lungs daily as needed (sob)., Disp: , Rfl:    torsemide (DEMADEX) 20 MG tablet, Take 1 tablet (20 mg total) by mouth daily. (Patient taking differently: One and a half tablets by mouth on Wednesday, Saturday, and sundays alternating one tablet and Monday, Tuesday, and Friday.), Disp: 135 tablet, Rfl: 1

## 2021-10-09 ENCOUNTER — Telehealth: Payer: Self-pay | Admitting: Pharmacy Technician

## 2021-10-09 NOTE — Telephone Encounter (Signed)
Submitted a Prior Authorization request to [employer] for  Yupelri and Arformoterol  via Fax. Will update once we receive a response.   Fax# [phone] Phone# [phone]

## 2021-10-17 NOTE — Telephone Encounter (Signed)
Received a fax regarding Prior Authorization from [facility] for ARFORMOTEROL. Authorization has been DENIED.

## 2021-10-21 LAB — CUP PACEART REMOTE DEVICE CHECK
Battery Remaining Longevity: 95 mo
Battery Remaining Percentage: 91 %
Battery Voltage: 3.01 V
Brady Statistic AP VP Percent: 1 %
Brady Statistic AP VS Percent: 57 %
Brady Statistic AS VP Percent: 1 %
Brady Statistic AS VS Percent: 41 %
Brady Statistic RA Percent Paced: 56 %
Brady Statistic RV Percent Paced: 1 %
Date Time Interrogation Session: 20221129014453
HighPow Impedance: 74 Ohm
Implantable Lead Implant Date: 20130213
Implantable Lead Implant Date: 20130213
Implantable Lead Location: 753859
Implantable Lead Location: 753860
Implantable Pulse Generator Implant Date: 20220228
Lead Channel Impedance Value: 340 Ohm
Lead Channel Impedance Value: 400 Ohm
Lead Channel Pacing Threshold Amplitude: 0.625 V
Lead Channel Pacing Threshold Amplitude: 1.25 V
Lead Channel Pacing Threshold Pulse Width: 0.5 ms
Lead Channel Pacing Threshold Pulse Width: 0.5 ms
Lead Channel Sensing Intrinsic Amplitude: 11.5 mV
Lead Channel Sensing Intrinsic Amplitude: 2.2 mV
Lead Channel Setting Pacing Amplitude: 1.625
Lead Channel Setting Pacing Amplitude: 2.5 V
Lead Channel Setting Pacing Pulse Width: 0.5 ms
Lead Channel Setting Sensing Sensitivity: 0.5 mV
Pulse Gen Serial Number: 111038341

## 2021-10-21 NOTE — Telephone Encounter (Signed)
Awaiting denial letter with reasoning.

## 2021-10-22 DIAGNOSIS — J449 Chronic obstructive pulmonary disease, unspecified: Secondary | ICD-10-CM | POA: Diagnosis not present

## 2021-10-22 DIAGNOSIS — Z9981 Dependence on supplemental oxygen: Secondary | ICD-10-CM | POA: Diagnosis not present

## 2021-10-22 DIAGNOSIS — I509 Heart failure, unspecified: Secondary | ICD-10-CM | POA: Diagnosis not present

## 2021-10-22 DIAGNOSIS — D692 Other nonthrombocytopenic purpura: Secondary | ICD-10-CM | POA: Diagnosis not present

## 2021-10-22 DIAGNOSIS — Z6826 Body mass index (BMI) 26.0-26.9, adult: Secondary | ICD-10-CM | POA: Diagnosis not present

## 2021-10-22 DIAGNOSIS — I4891 Unspecified atrial fibrillation: Secondary | ICD-10-CM | POA: Diagnosis not present

## 2021-10-22 DIAGNOSIS — Z87891 Personal history of nicotine dependence: Secondary | ICD-10-CM | POA: Diagnosis not present

## 2021-10-23 ENCOUNTER — Other Ambulatory Visit (HOSPITAL_COMMUNITY): Payer: Self-pay

## 2021-10-23 NOTE — Telephone Encounter (Signed)
Received a fax regarding Prior Authorization from [facility] for ARFORMOTEROL.  [id number]  Received a fax regarding Prior Authorization from [facility] for BUDESONIDE. [id number]  Received a fax regarding Prior Authorization from [facility] for YUPELRI. [id number]  Authorization has been DENIED because THEY ARE NOT COVERED THROUGH PART D, BUT IS BILLABLE UNDER PART B AS LONG AS DOCTOR PRESCRIBES.

## 2021-10-29 NOTE — Telephone Encounter (Signed)
4:34 PM You contacted Gregor Hams, Monchell R, CPhT to [facility] Pool     3:15 PM If need is still there for those neb solutions, scripts can be sent to [facility].    Called the pt and there was no answer- LMTCB

## 2021-11-03 ENCOUNTER — Other Ambulatory Visit: Payer: Self-pay

## 2021-11-03 ENCOUNTER — Encounter: Payer: Self-pay | Admitting: Internal Medicine

## 2021-11-03 ENCOUNTER — Ambulatory Visit: Payer: PPO | Admitting: Internal Medicine

## 2021-11-03 VITALS — BP 116/66 | HR 69 | Ht 73.0 in | Wt 208.0 lb

## 2021-11-03 DIAGNOSIS — Z79899 Other long term (current) drug therapy: Secondary | ICD-10-CM | POA: Diagnosis not present

## 2021-11-03 DIAGNOSIS — I428 Other cardiomyopathies: Secondary | ICD-10-CM

## 2021-11-03 NOTE — Patient Instructions (Signed)
Medication Instructions:  Your physician recommends that you continue on your current medications as directed. Please refer to the Current Medication list given to you today.  *If you need a refill on your cardiac medications before your next appointment, please call your pharmacy*   Lab Work: Amiodarone surveillance lab work today: TSH & LFTs  If you have labs (blood work) drawn today and your tests are completely normal, you will receive your results only by: [place] (if you have MyChart) OR A paper copy in the mail If you have any lab test that is abnormal or we need to change your treatment, we will call you to review the results.   Testing/Procedures: None ordered   Follow-Up: At [facility], you and your health needs are our priority.  As part of our continuing mission to provide you with exceptional heart care, we have created designated Provider Care Teams.  These Care Teams include your primary Cardiologist (physician) and Advanced Practice Providers (APPs -  Physician Assistants and Nurse Practitioners) who all work together to provide you with the care you need, when you need it.  Remote monitoring is used to monitor your Pacemaker or ICD from home. This monitoring reduces the number of office visits required to check your device to one time per year. It allows [place] to keep an eye on the functioning of your device to ensure it is working properly. You are scheduled for a device check from home on 01/20/2022. You may send your transmission at any time that day. If you have a wireless device, the transmission will be sent automatically. After your physician reviews your transmission, you will receive a postcard with your next transmission date.  Your next appointment:   6 month(s)  The format for your next appointment:   In Person  Provider:   Virl Axe, MD   Thank you for choosing [facility]!!   [phone]

## 2021-11-03 NOTE — Progress Notes (Signed)
Patient Care Team: [facility] as PCP - Cheral Bay, MD as PCP - Cardiology (Cardiology) Deboraha Sprang, MD as PCP - Electrophysiology (Cardiology) Annye Asa as Physician Assistant (Family Medicine)   HPI  Corey Tyler is a 80 y.o. male Seen in followup for ICD implantation 2/13 for primary prevention. This occurred in the context of ischemic/nonischemic cardiomyopathy with underlying bradycardia.  GEN change 2/13 November 2013 he had appropriate ICD discharge for polymorphic ventricular tachycardia. His Coreg was up titrated.   Subsequent Myoview scan was not gated; it demonstrated a large inferolateral and anterolateral scar without ischemia.  He also has a history of atrial fibrillation.  Treated in the past with rivaroxaban complicated by GI bleeding, more recent with apixaban; also on amiodarone.  12/17 he was admitted with sustained ventricular tachycardia below his detection rate. Amiodarone was uptitrated device was reprogrammed.    He was ablated  7/18 (GT)   11/18 Interval ventricular tachycardia prompted the recent introduction of ranolazine adjunctive to his amiodarone;  ranolazine was uptitrated from 500--1000 mg twice daily.  Hospitalized/20 with a small bowel obstruction and interval respiratory failure, and again in 2/21 having presented with weakness and dizziness and found to be orthostatic.  Multiple medications were held temporarily.  Subsequent to discharge, he has had problems with hypotension.     Chronic oxygen.  Good days and bad days.  No palpitations.'s mild edema.  Nonpredictable chest pain.          DATE TEST EF   2008 echo  40-45%   1/14 echo    15 %   8/17 echo 25 %   10/18 Echo   45-50%   9/20 Echo  25-30%       Date Cr K TSH LFTs Hgb PFTs  5/15    8.63 23    12/17    4.55 18    10/18   1.603 27    2/19   4.03 17 14.1   2/21 1.34 3.8 1.48 20(10/20) 12.8   9/21 1.50 4.0 2.68 17 12.6    1/22 1.2 4.3 0.833 33 (54) 11.9 (6/22)        Past Medical History:  Diagnosis Date   A-fib ([place])    a. Noted on 12/2012 interrogation, placed on Apixaban and ultimately stopped NOACs 2/2 personal decision 8/14.   AICD (automatic cardioverter/defibrillator) present    Cardiac defibrillator -dual  [employer]   Arthritis    Atrial tachycardia-non sustained    a. Noted on 03/2012 interrogation.   Chronic systolic heart failure (HCC)    EF down to 20 to 25% per echo November 2012   COPD (chronic obstructive pulmonary disease) ([place])    Hyperlipidemia    Hypertension    ICD (implantable cardiac defibrillator) in place    NICM (nonischemic cardiomyopathy) ([place])    a. Normal cors 2000. b. Minimal plaque 2013.   Old myocardial infarction    a. ?Silent MI. b. Large fixed inferolateral defect c/w with prior infarct, no ischemia. c. Cath in 2000/2013 with only minimal CAD.   Presence of permanent cardiac pacemaker    Pulmonary nodule, right    Last scan in 2010 showing stability; felt to be benign.   PVC's (premature ventricular contractions)    Ventricular tachycardia, polymorphic    Rx via ICD 12/14    Past Surgical History:  Procedure Laterality Date   APPENDECTOMY     CARDIAC CATHETERIZATION  [place]     CARDIOVERSION  03/30/2013   Procedure: CARDIOVERSION;  Surgeon: [facility]ayer Headings, MD;  Location: [facility];  Service: Cardiovascular;;   COLON SURGERY     COLONOSCOPY N/A 09/25/2013   Procedure: COLONOSCOPY;  Surgeon: Jerene Bears, MD;  Location: [facility];  Service: Endoscopy;  Laterality: N/A;   ICD  12/2011   [name]   ICD GENERATOR CHANGEOUT N/A 01/20/2021   Procedure: ICD GENERATOR CHANGEOUT;  Surgeon: Deboraha Sprang, MD;  Location: [facility];  Service: Cardiovascular;  Laterality: N/A;   IMPLANTABLE CARDIOVERTER DEFIBRILLATOR IMPLANT N/A 01/06/2012   Procedure: IMPLANTABLE CARDIOVERTER DEFIBRILLATOR IMPLANT;  Surgeon: Deboraha Sprang,  MD;  Location: [facility] CATH LAB;  Service: Cardiovascular;  Laterality: N/A;   PACEMAKER IMPLANT     St [name]   TEE WITHOUT CARDIOVERSION N/A 03/30/2013   Procedure: TRANSESOPHAGEAL ECHOCARDIOGRAM (TEE);  Surgeon: Th[facility] Headings, MD;  Location: [facility];  Service: Cardiovascular;  Laterality: N/A;   V TACH ABLATION  06/10/2017   V TACH ABLATION N/A 06/10/2017   Procedure: Stephanie Coup Ablation;  Surgeon: Evans Lance, MD;  Location: [place];  Service: Cardiovascular;  Laterality: N/A;    Current Outpatient Medications  Medication Sig Dispense Refill   acetaminophen (TYLENOL) 500 MG tablet Take 500 mg by mouth every 6 (six) hours as needed for headache (pain).     albuterol (PROVENTIL) (2.5 MG/3ML) 0.083% nebulizer solution Take 2.5 mg by nebulization every 6 (six) hours as needed for wheezing or shortness of breath.     albuterol (VENTOLIN HFA) 108 (90 Base) MCG/ACT inhaler Inhale 2 puffs into the lungs every 6 (six) hours as needed for wheezing or shortness of breath.     amiodarone (PACERONE) 200 MG tablet Take 1 tablet (200 mg total) by mouth 2 (two) times daily. 180 tablet 2   budesonide-formoterol (SYMBICORT) 160-4.5 MCG/ACT inhaler Inhale 2 puffs into the lungs 2 (two) times daily.     Carboxymethylcellul-Glycerin (LUBRICATING EYE DROPS OP) Place 1 drop into both eyes daily as needed (dry eyes).     cholecalciferol (VITAMIN D) 1000 UNITS tablet Take 1,000 Units by mouth daily.     ELIQUIS 5 MG TABS tablet TAKE 1 TABLET BY MOUTH TWICE A DAY 60 tablet 5   finasteride (PROSCAR) 5 MG tablet Take 5 mg by mouth daily.      guaiFENesin (MUCINEX) 600 MG 12 hr tablet Take 2 tablets (1,200 mg total) by mouth 2 (two) times daily. 30 tablet 0   levothyroxine (SYNTHROID, LEVOTHROID) 125 MCG tablet Take 125 mcg by mouth daily before breakfast.      MAGNESIUM PO Take 420 mg by mouth at bedtime.     melatonin 5 MG TABS Take 1 tablet (5 mg total) by mouth at bedtime as needed (sleep). 30 tablet 0    metoprolol succinate (TOPROL XL) 25 MG 24 hr tablet Take 0.5 tablets (12.5 mg total) by mouth at bedtime. (Patient taking differently: at bedtime. Take 0.5 tablet by mouth in the morning and 0.5 tablet by mouth in the evening.) 30 tablet 0   Multiple Vitamins-Minerals (MULTIVITAMIN WITH MINERALS) tablet Take 1 tablet by mouth daily. Mens 50 plus     nitroGLYCERIN (NITROSTAT) 0.4 MG SL tablet Place 0.4 mg under the tongue every 5 (five) minutes x 3 doses as needed for chest pain.     Omega-3 Fatty Acids (FISH OIL) 1000 MG CPDR Take 1,000 mg by mouth daily.  OXYGEN Inhale 4 L into the lungs See admin instructions.     potassium chloride (MICRO-K) 10 MEQ CR capsule Take 20 mEq by mouth daily.     promethazine (PHENERGAN) 25 MG tablet Take 25 mg by mouth daily as needed for nausea or vomiting.      Psyllium (METAMUCIL PO) Take 15 g by mouth daily. Mix in liquid and drink     ranolazine (RANEXA) 1000 MG SR tablet Take 1 tablet (1,000 mg total) by mouth 2 (two) times daily. 180 tablet 3   sodium chloride (OCEAN) 0.65 % SOLN nasal spray Place 1 spray into both nostrils daily as needed for congestion.     tamsulosin (FLOMAX) 0.4 MG CAPS capsule Take 0.4 mg by mouth at bedtime.     Tiotropium Bromide Monohydrate (SPIRIVA RESPIMAT) 2.5 MCG/ACT AERS Inhale 2 puffs into the lungs daily as needed (sob).     torsemide (DEMADEX) 20 MG tablet Take 1 tablet (20 mg total) by mouth daily. (Patient taking differently: One and a half tablets by mouth on Wednesday, Saturday, and sundays alternating one tablet and Monday, Tuesday, and Friday.) 135 tablet 1   No current facility-administered medications for this visit.    Allergies  Allergen Reactions   Atorvastatin Shortness Of Breath and Cough   Statins Shortness Of Breath and Other (See Comments)    Muscle and joing pain and "These bother my lungs and make my gait unsteady, also"   Xarelto [Rivaroxaban] Other (See Comments)    Bleeding   Adhesive [Tape] Hives,  Itching and Rash   Codeine Nausea Only   Isosorbide Nitrate Other (See Comments)    Made him feel bad   Latex Itching and Other (See Comments)    Rash, itching, burning   Levofloxacin Other (See Comments)    Tachycardia   Lisinopril Cough   Lorazepam Other (See Comments)    "felt bad"   Mexiletine Other (See Comments)    GI distress   Sertraline Other (See Comments)    Made him feel bad    Review of Systems negative except from HPI and PMH  Physical Exam BP 116/66   Pulse 69   Ht 6\' 1"  (1.854 m)   Wt 208 lb (94.3 kg)   SpO2 90%   BMI 27.44 kg/m  Well developed and well nourished in no acute distress using oxygen HENT normal Neck supple   Clear Device pocket well healed; without hematoma or erythema.  There is no tethering  Regular rate and rhythm, no  gallop No murmur Abd-soft with active BS No Clubbing cyanosis 1+ edema Skin-warm and dry A & Oriented  Grossly normal sensory and motor function  ECG sinus at 69 Intervals 18/12/44 Right axis at 92  Assessment and plan  Atrial fibrillation paroxysmal/persistent   High risk medication amiodarone  Chronotropic incompetence   Implantable defibrillator-St [name]  The patient's device was interrogated.  The information was reviewed. No changes were made in the programming.     Ischemic/nonischemic cardiomyopathy interval improvement  Congestive heart failure-chronic-systolic  Ventricular tachycardia--recurrent   Hypotension    Lung disease-oxygen dependent  PVCs   Edematous.  Likely multifactorial largely aggravated by his right heart failure.  With low blood pressure, we will not push his diuretics  Continue his amiodarone 200 mg a day for recurrent ventricular tachycardia and ranolazine 1000 mg twice daily  Continue Eliquis 5 mg twice daily for his atrial fibrillation last CBC was stable  For his cardiomyopathy we will continue metoprolol  12.5 t daily, blood pressure limits his other medications

## 2021-11-04 LAB — HEPATIC FUNCTION PANEL
ALT: 17 IU/L (ref 0–44)
AST: 14 IU/L (ref 0–40)
Albumin: 3.7 g/dL (ref 3.6–4.6)
Alkaline Phosphatase: 79 IU/L (ref 44–121)
Bilirubin Total: 0.4 mg/dL (ref 0.0–1.2)
Bilirubin, Direct: 0.19 mg/dL (ref 0.00–0.40)
Total Protein: 5.7 g/dL — ABNORMAL LOW (ref 6.0–8.5)

## 2021-11-04 LAB — TSH: TSH: 1.47 u[IU]/mL (ref 0.450–4.500)

## 2021-11-05 ENCOUNTER — Encounter: Payer: PPO | Admitting: Internal Medicine

## 2021-11-06 ENCOUNTER — Telehealth: Payer: Self-pay

## 2021-11-06 NOTE — Telephone Encounter (Signed)
The patient wife states his ICD was alarming. The patient wife states he was not near a magnet. I asked him to send a manual transmission for the nurse to review. I called tech support to get additional help. The patient has to come into the office to be on the programmer because his app is not working. His appointment time is 11/07/2021 at 10 am.

## 2021-11-07 ENCOUNTER — Other Ambulatory Visit: Payer: Self-pay

## 2021-11-07 ENCOUNTER — Ambulatory Visit: Payer: No Typology Code available for payment source

## 2021-11-07 ENCOUNTER — Telehealth (HOSPITAL_COMMUNITY): Payer: Self-pay

## 2021-11-07 DIAGNOSIS — I4729 Other ventricular tachycardia: Secondary | ICD-10-CM

## 2021-11-07 NOTE — Telephone Encounter (Signed)
Successful telephone encounter to patient to change today's device clinic appointment from 10:00 am to 11:30 secondary to need for industry to be present. Patient agreeable. Industry notified.

## 2021-11-07 NOTE — Progress Notes (Signed)
Device interrogated by industry. No RF telemetry issues found.

## 2021-12-08 ENCOUNTER — Telehealth: Payer: Self-pay | Admitting: Cardiology

## 2021-12-08 ENCOUNTER — Encounter: Payer: Self-pay | Admitting: Internal Medicine

## 2021-12-08 NOTE — Telephone Encounter (Signed)
Error

## 2021-12-08 NOTE — Telephone Encounter (Signed)
° °  Pre-operative Risk Assessment    Patient Name: Corey Tyler  DOB: 1941-05-04 MRN: [id number]      Request for Surgical Clearance    Procedure:  Rexum   Date of Surgery:  Clearance 02/02/22                                 Surgeon:  Dr. Festus Aloe Surgeon's Group or Practice Name:  [facility] Phone number:  [phone] Fax number:  [phone]   Type of Clearance Requested:   - Medical  - Pharmacy:  Hold Apixaban (Eliquis) 3 day prior   Type of Anesthesia:  Nitrous Oxide   Additional requests/questions:  No  Crist Infante   12/08/2021, 10:54 AM

## 2021-12-09 NOTE — Telephone Encounter (Signed)
Patient with diagnosis of afib on Eliquis for anticoagulation.    Procedure: assuming initial request was a typo, rexum is not a procedure but rezum is Date of procedure: 02/02/22  CHA2DS2-VASc Score = 5  This indicates a 7.2% annual risk of stroke. The patient's score is based upon: CHF History: 1 HTN History: 1 Diabetes History: 0 Stroke History: 0 Vascular Disease History: 1 Age Score: 2 Gender Score: 0   CrCl 21mL/min Platelet count 234K  Per office protocol, patient can hold Eliquis for 3 days prior to procedure as requested.

## 2021-12-09 NOTE — Telephone Encounter (Signed)
° °  Primary Cardiologist: Minus Breeding, MD  Chart reviewed as part of pre-operative protocol coverage. Given past medical history and time since last visit, based on ACC/AHA guidelines, Corey Tyler would be at acceptable risk for the planned procedure without further cardiovascular testing.   Patient with diagnosis of afib on Eliquis for anticoagulation.     Procedure: assuming initial request was a typo, rexum is not a procedure but rezum is Date of procedure: 02/02/22   CHA2DS2-VASc Score = 5  This indicates a 7.2% annual risk of stroke. The patient's score is based upon: CHF History: 1 HTN History: 1 Diabetes History: 0 Stroke History: 0 Vascular Disease History: 1 Age Score: 2 Gender Score: 0   CrCl 71mL/min Platelet count 234K   Per office protocol, patient can hold Eliquis for 3 days prior to procedure as requested.  I will route this recommendation to the requesting party via Epic fax function and remove from pre-op pool.  Please call with questions.  Corey Ng. Winthrop Shannahan NP-C    12/09/2021, 1:59 PM [place] [address] Office [phone] Fax [phone]

## 2021-12-10 ENCOUNTER — Telehealth: Payer: Self-pay

## 2021-12-10 ENCOUNTER — Emergency Department (HOSPITAL_COMMUNITY)
Admission: EM | Admit: 2021-12-10 | Discharge: 2021-12-24 | Disposition: E | Payer: No Typology Code available for payment source | Attending: Emergency Medicine | Admitting: Emergency Medicine

## 2021-12-10 DIAGNOSIS — I472 Ventricular tachycardia, unspecified: Secondary | ICD-10-CM

## 2021-12-10 DIAGNOSIS — I469 Cardiac arrest, cause unspecified: Secondary | ICD-10-CM

## 2021-12-10 DIAGNOSIS — R531 Weakness: Secondary | ICD-10-CM | POA: Diagnosis not present

## 2021-12-10 LAB — CBG MONITORING, ED: Glucose-Capillary: 84 mg/dL (ref 70–99)

## 2021-12-10 MED ORDER — EPINEPHRINE 1 MG/10ML IJ SOSY
PREFILLED_SYRINGE | INTRAMUSCULAR | Status: AC | PRN
  Administered 2021-12-10: 1 mg via INTRAVENOUS

## 2021-12-17 NOTE — Telephone Encounter (Signed)
Spoke with pts wife after his death.

## 2021-12-24 NOTE — Telephone Encounter (Signed)
[facility] alert received for sustained VT with successful ATP. Event occurred 1/17 @ 10:19, EGM show sustained VT, rate 142.  Successful ATP delivered x1 returning rhythm to regular AS/VS. PVC's=1.4%  Patient reports he was aware of the VT event. States he was sitting in his chair and felt very weak and palpitations.  Reports he does feel better today other than feeling "shaky" and unable to ambulate. Patient reports this happened 1 previous time, he went to the ED and received 2 bags of IV fluids. Advised patient he needs to go to the ED considering unable to ambulate (normally gets around well). Advised his electrolytes could be off balance. Patient refused as to doesn't want to wait in the lobby for hours with other sick patients. Requested patient call PCP to see if they can see him. Patient also refused due to transportation.  Again, attempted to encourage patient to seek ED evaluation, declined. Patients wife was with him during our conversation and aware of recommendations also.  Shock plan reviewed and advised per [place] DMV no driving x6 months with start late of 12/09/21. Verbalized understanding.

## 2021-12-24 NOTE — ED Provider Notes (Signed)
[facility] EMERGENCY DEPARTMENT Provider Note   CSN: [id number] Arrival date & time: 12/15/21  1104     History  No chief complaint on file.   Corey Tyler is a 81 y.o. male.  Patient presents with EMS after witnessed cardiac arrest at home.  Initially EMS was called for general weakness and not feeling well.  Per report patient had an episode of V. tach yesterday evening reported on his AICD.  No report of chest pain today.  EMS arrived and he was generally weak and soon after became unresponsive.  CPR initiated immediately and 7 epinephrines given.  Patient did not have V. tach on EMS monitor however initially shortly after rest AICD shocked few times.      Home Medications Prior to Admission medications   Not on File      Allergies    Patient has no allergy information on record.    Review of Systems   Review of Systems  Unable to perform ROS: Patient unresponsive   Physical Exam Updated Vital Signs BP 123/72 (BP Location: Left Arm)    Pulse (!) 120    Temp (!) 95.7 F (35.4 C) (Temporal)    Resp 18    Ht 6\' 3"  (1.905 m)    Wt 102.1 kg    SpO2 (!) 70%    BMI 28.12 kg/m  Physical Exam Vitals and nursing note reviewed.  Constitutional:      Appearance: He is well-developed. He is ill-appearing and toxic-appearing.  HENT:     Head: Normocephalic.     Comments: King airway in place, no spontaneous respirations    Nose: No congestion.     Mouth/Throat:     Mouth: Mucous membranes are dry.  Eyes:     General:        Right eye: No discharge.        Left eye: No discharge.  Neck:     Trachea: No tracheal deviation.  Cardiovascular:     Comments: No pulses, delayed cap refill. Pulmonary:     Comments: Lungs equal bilateral with bagging respirations Abdominal:     General: There is no distension.     Palpations: Abdomen is soft.  Musculoskeletal:     Cervical back: Neck supple.  Skin:    General: Skin is warm.     Capillary Refill:  Capillary refill takes less than 2 seconds.     Findings: No rash.  Neurological:     GCS: GCS eye subscore is 1. GCS verbal subscore is 1. GCS motor subscore is 1.     Cranial Nerves: Cranial nerve deficit present.     Comments: Pupils approximately 3 mm bilateral not responsive to light.  No spontaneous respirations, no response to painful stimuli.  Psychiatric:     Comments: Unresponsive    ED Results / Procedures / Treatments   Labs (all labs ordered are listed, but only abnormal results are displayed) Labs Reviewed  CBG MONITORING, ED    EKG None  Radiology No results found.  Procedures CPR  Date/Time: 2021-12-15 11:48 AM Performed by: Elnora Morrison, MD Authorized by: Elnora Morrison, MD  CPR Procedure Details:      Amount of time prior to administration of ACLS/BLS (minutes):  60   ACLS/BLS initiated by EMS: Yes     CPR/ACLS performed in the ED: Yes     Duration of CPR (minutes):  75   Outcome: Pt declared dead    CPR performed via  ACLS guidelines under my direct supervision.  See RN documentation for details including defibrillator use, medications, doses and timing. Ultrasound ED Echo  Date/Time: 01/01/2022 11:50 AM Performed by: Elnora Morrison, MD Authorized by: Elnora Morrison, MD   Procedure details:    Indications: cardiac arrest     Views: subxiphoid and parasternal long axis view     Images: archived   Findings:    Pericardium: no pericardial effusion     Cardiac Activity: normal cardiac activity and no cardiac activity   Impression:    Impression: abnormal cardiac activity and decreased contractility      Medications Ordered in ED Medications  EPINEPHrine (ADRENALIN) 1 MG/10ML injection (1 mg Intravenous Given January 01, 2022 1109)    ED Course/ Medical Decision Making/ A&P                           Medical Decision Making Amount and/or Complexity of Data Reviewed ECG/medicine tests: ordered and independent interpretation  performed.  Risk Prescription drug management.  Patient presents with EMS after witnessed cardiac arrest.  On arrival [name] device continuing CPR, patient was intubated in the field with [facility] airway, equal breath sounds bilateral on assessment with respiratory therapy.  Bagging continued by respiratory therapist and CPR continued on arrival.  Discussed with EMS details, medical history and any further information that had.  No concern for drugs on scene, family did arrive during resuscitation efforts.  Patient full code. Multiple rounds of CPR continued, epinephrine given.  Patient has no signs of life on pulse checks, no pulse, no spontaneous respirations.  Bedside ultrasound utilized twice and no significant cardiac activity. Point-of-care glucose normal reviewed, and no documentation in our system, per report [facility].  Medical records reviewed, none visible at this time. Updated family on outcome and also discussed with primary care physician's office to fill out death certificate.  Patient is not an ME case.  Likely cause of death arrhythmia/cardiac/ACS.  Discussed with nursing staff for morgue and neck steps.        Final Clinical Impression(s) / ED Diagnoses Final diagnoses:  Ventricular tachyarrhythmia  Cardiac arrest [facility])    Rx / DC Orders ED Discharge Orders     None         Elnora Morrison, MD 01-01-22 1344

## 2021-12-24 NOTE — ED Notes (Signed)
Compression stopped for pulse check. No pulse. Compressions resumed

## 2021-12-24 NOTE — ED Notes (Signed)
Attempted to contact family about making arrangements for funeral home. Left message on wife and sons VM

## 2021-12-24 NOTE — Code Documentation (Signed)
Patient time of death occurred at 69

## 2021-12-24 NOTE — ED Triage Notes (Addendum)
Pt here via EMS generalized weakness. EMS arrived pt at home AOX4. When pt stood up, pt was very shaky and collasped into chair. No pulses, EMS began CPR. Total of 7 epi given by EMS. 1585mL NS. No ROSC achieved.  98/58 95% 6 liters/ bagged HR 80's

## 2021-12-24 NOTE — ED Notes (Signed)
Pulse check: No pulse.PEA. No significant cardiac motion per Dr. Otila Back.

## 2021-12-24 DEATH — deceased

## 2022-04-28 ENCOUNTER — Encounter: Payer: No Typology Code available for payment source | Admitting: Internal Medicine
